# Patient Record
Sex: Male | Born: 1955
Health system: Southern US, Community
[De-identification: ages and names within clinical notes are randomized; demographics above are authoritative.]

## PROBLEM LIST (undated history)

## (undated) DIAGNOSIS — E559 Vitamin D deficiency, unspecified: Secondary | ICD-10-CM

## (undated) DIAGNOSIS — R0600 Dyspnea, unspecified: Secondary | ICD-10-CM

## (undated) DIAGNOSIS — E213 Hyperparathyroidism, unspecified: Secondary | ICD-10-CM

## (undated) DIAGNOSIS — N184 Chronic kidney disease, stage 4 (severe): Secondary | ICD-10-CM

## (undated) DIAGNOSIS — M25562 Pain in left knee: Secondary | ICD-10-CM

## (undated) DIAGNOSIS — I5021 Acute systolic (congestive) heart failure: Secondary | ICD-10-CM

## (undated) DIAGNOSIS — Z95810 Presence of automatic (implantable) cardiac defibrillator: Secondary | ICD-10-CM

## (undated) DIAGNOSIS — Z87442 Personal history of urinary calculi: Secondary | ICD-10-CM

## (undated) DIAGNOSIS — I255 Ischemic cardiomyopathy: Secondary | ICD-10-CM

## (undated) DIAGNOSIS — I509 Heart failure, unspecified: Secondary | ICD-10-CM

## (undated) DIAGNOSIS — M1990 Unspecified osteoarthritis, unspecified site: Secondary | ICD-10-CM

## (undated) DIAGNOSIS — E119 Type 2 diabetes mellitus without complications: Secondary | ICD-10-CM

## (undated) DIAGNOSIS — N200 Calculus of kidney: Secondary | ICD-10-CM

## (undated) DIAGNOSIS — I10 Essential (primary) hypertension: Secondary | ICD-10-CM

## (undated) HISTORY — DX: Type 2 diabetes mellitus without complications: E11.9

## (undated) HISTORY — PX: KIDNEY STONE SURGERY: SHX686

## (undated) HISTORY — DX: Calculus of kidney: N20.0

## (undated) HISTORY — PX: LITHOTRIPSY: SUR834

## (undated) HISTORY — DX: Acute systolic (congestive) heart failure: I50.21

## (undated) HISTORY — PX: COLONOSCOPY: SHX174

## (undated) HISTORY — DX: Heart failure, unspecified: I50.9

## (undated) HISTORY — DX: Chronic kidney disease, stage 4 (severe): N18.4

## (undated) HISTORY — PX: CHOLECYSTECTOMY: SHX55

---

## 1989-05-04 HISTORY — PX: AMPUTATION TOE: SHX6595

## 2000-05-04 DIAGNOSIS — I10 Essential (primary) hypertension: Secondary | ICD-10-CM

## 2000-05-04 HISTORY — DX: Essential (primary) hypertension: I10

## 2001-01-21 ENCOUNTER — Emergency Department (HOSPITAL_COMMUNITY): Admission: EM | Admit: 2001-01-21 | Discharge: 2001-01-21 | Payer: Self-pay | Admitting: *Deleted

## 2001-01-23 ENCOUNTER — Emergency Department (HOSPITAL_COMMUNITY): Admission: EM | Admit: 2001-01-23 | Discharge: 2001-01-23 | Payer: Self-pay | Admitting: *Deleted

## 2001-06-03 ENCOUNTER — Emergency Department (HOSPITAL_COMMUNITY): Admission: EM | Admit: 2001-06-03 | Discharge: 2001-06-03 | Payer: Self-pay | Admitting: Emergency Medicine

## 2004-05-27 ENCOUNTER — Emergency Department (HOSPITAL_COMMUNITY): Admission: EM | Admit: 2004-05-27 | Discharge: 2004-05-28 | Payer: Self-pay | Admitting: Emergency Medicine

## 2004-09-30 ENCOUNTER — Emergency Department (HOSPITAL_COMMUNITY): Admission: EM | Admit: 2004-09-30 | Discharge: 2004-09-30 | Payer: Self-pay | Admitting: Emergency Medicine

## 2005-10-26 ENCOUNTER — Emergency Department (HOSPITAL_COMMUNITY): Admission: EM | Admit: 2005-10-26 | Discharge: 2005-10-26 | Payer: Self-pay | Admitting: Emergency Medicine

## 2005-11-05 ENCOUNTER — Encounter (INDEPENDENT_AMBULATORY_CARE_PROVIDER_SITE_OTHER): Payer: Self-pay | Admitting: Specialist

## 2005-11-05 ENCOUNTER — Ambulatory Visit (HOSPITAL_COMMUNITY): Admission: RE | Admit: 2005-11-05 | Discharge: 2005-11-05 | Payer: Self-pay | Admitting: Urology

## 2005-11-06 ENCOUNTER — Ambulatory Visit (HOSPITAL_COMMUNITY): Admission: RE | Admit: 2005-11-06 | Discharge: 2005-11-06 | Payer: Self-pay | Admitting: Internal Medicine

## 2006-04-09 ENCOUNTER — Emergency Department (HOSPITAL_COMMUNITY): Admission: EM | Admit: 2006-04-09 | Discharge: 2006-04-09 | Payer: Self-pay | Admitting: Emergency Medicine

## 2007-04-22 ENCOUNTER — Emergency Department (HOSPITAL_COMMUNITY): Admission: EM | Admit: 2007-04-22 | Discharge: 2007-04-22 | Payer: Self-pay | Admitting: Emergency Medicine

## 2007-06-13 ENCOUNTER — Emergency Department (HOSPITAL_COMMUNITY): Admission: EM | Admit: 2007-06-13 | Discharge: 2007-06-13 | Payer: Self-pay | Admitting: Emergency Medicine

## 2007-10-06 ENCOUNTER — Emergency Department (HOSPITAL_COMMUNITY): Admission: EM | Admit: 2007-10-06 | Discharge: 2007-10-06 | Payer: Self-pay | Admitting: Emergency Medicine

## 2007-11-03 ENCOUNTER — Emergency Department (HOSPITAL_COMMUNITY): Admission: EM | Admit: 2007-11-03 | Discharge: 2007-11-03 | Payer: Self-pay | Admitting: Emergency Medicine

## 2007-11-15 ENCOUNTER — Inpatient Hospital Stay (HOSPITAL_COMMUNITY): Admission: EM | Admit: 2007-11-15 | Discharge: 2007-11-18 | Payer: Self-pay | Admitting: Emergency Medicine

## 2007-11-30 ENCOUNTER — Ambulatory Visit (HOSPITAL_COMMUNITY): Admission: RE | Admit: 2007-11-30 | Discharge: 2007-11-30 | Payer: Self-pay | Admitting: Urology

## 2007-12-01 ENCOUNTER — Emergency Department (HOSPITAL_COMMUNITY): Admission: EM | Admit: 2007-12-01 | Discharge: 2007-12-01 | Payer: Self-pay | Admitting: Emergency Medicine

## 2007-12-08 ENCOUNTER — Inpatient Hospital Stay (HOSPITAL_COMMUNITY): Admission: EM | Admit: 2007-12-08 | Discharge: 2007-12-09 | Payer: Self-pay | Admitting: Emergency Medicine

## 2007-12-14 ENCOUNTER — Emergency Department (HOSPITAL_COMMUNITY): Admission: EM | Admit: 2007-12-14 | Discharge: 2007-12-14 | Payer: Self-pay | Admitting: Emergency Medicine

## 2008-03-07 ENCOUNTER — Emergency Department (HOSPITAL_COMMUNITY): Admission: EM | Admit: 2008-03-07 | Discharge: 2008-03-07 | Payer: Self-pay | Admitting: Emergency Medicine

## 2008-03-14 ENCOUNTER — Inpatient Hospital Stay (HOSPITAL_COMMUNITY): Admission: EM | Admit: 2008-03-14 | Discharge: 2008-03-17 | Payer: Self-pay | Admitting: Emergency Medicine

## 2008-03-16 ENCOUNTER — Encounter (INDEPENDENT_AMBULATORY_CARE_PROVIDER_SITE_OTHER): Payer: Self-pay | Admitting: General Surgery

## 2008-09-25 IMAGING — CT CT PELVIS W/O CM
1 of 2 series · 15 of 32 positions shown, 19 images · non-contrast
Comparison: 12/08/2007

CT ABDOMEN

CLINICAL DATA: Left flank pain and epigastric pain.  Nausea.
Known renal stones.

CT ABDOMEN AND PELVIS WITHOUT CONTRAST
TECHNIQUE: Multidetector CT imaging of the abdomen and pelvis was
performed following the standard
protocol without intravenous contrast.

[Series 2: stone under (id) w/ prior · axial · 0.73mm/px · z∈[-513,-78]mm · 15 of 95 slices shown, 19 images]
[im 4/95  soft-tissue]
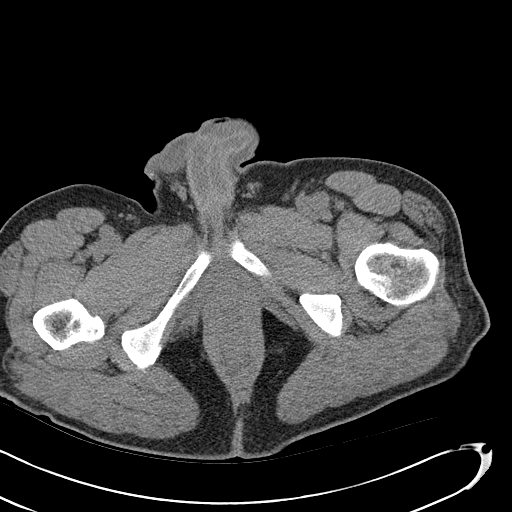
[im 4/95  bone]
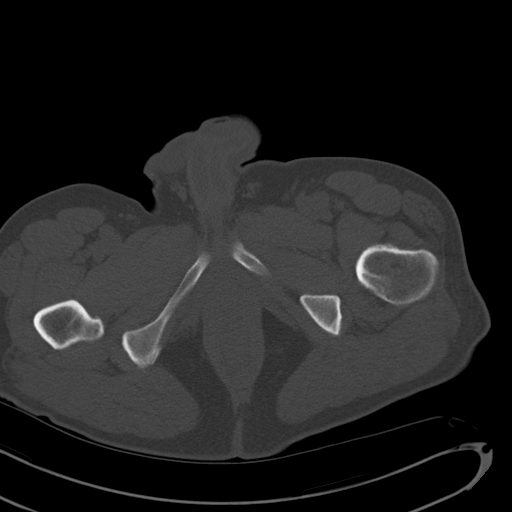
[im 12/95  soft-tissue]
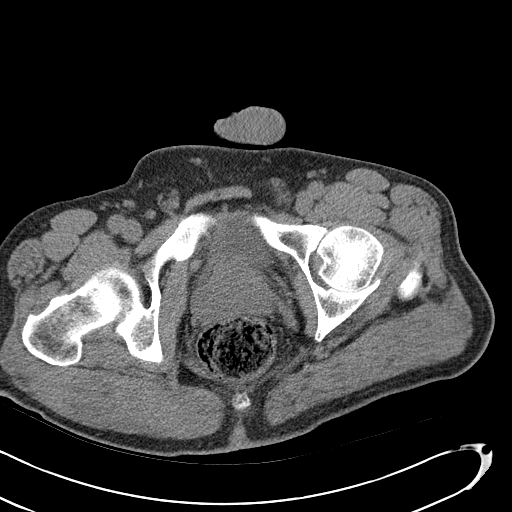
[im 20/95  soft-tissue]
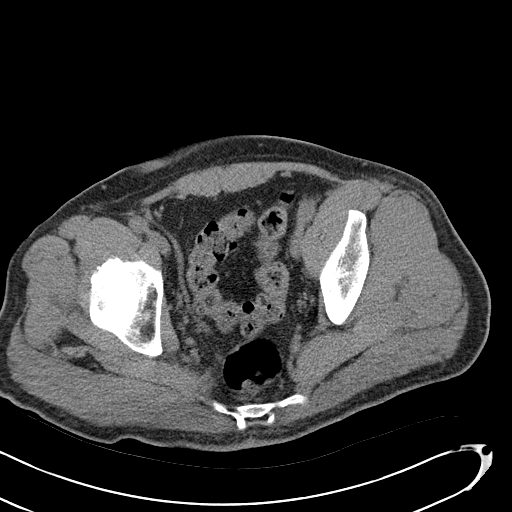
[im 28/95  soft-tissue]
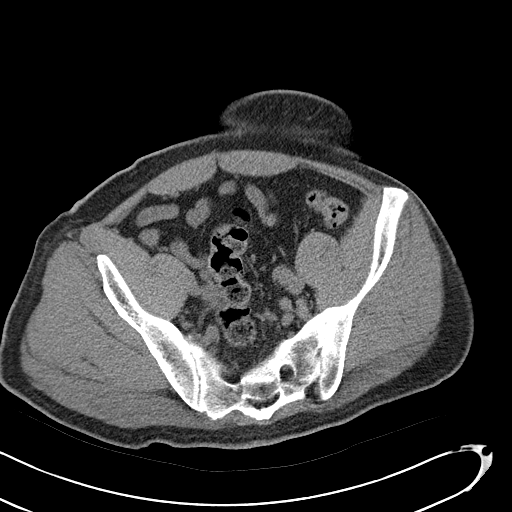
[im 32/95  soft-tissue]
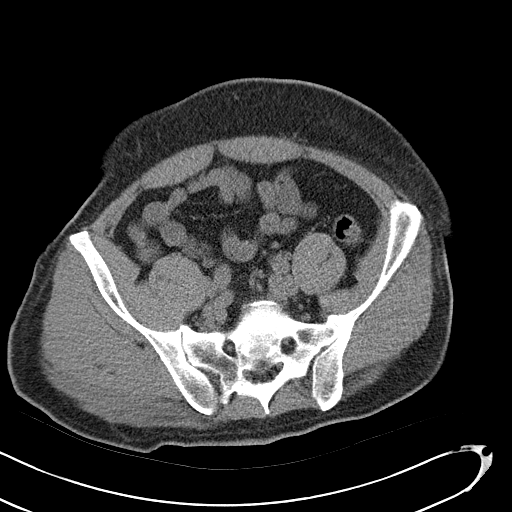
[im 40/95  soft-tissue]
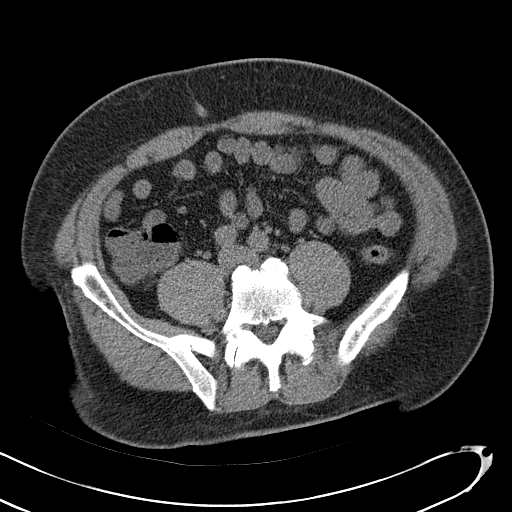
[im 48/95  soft-tissue]
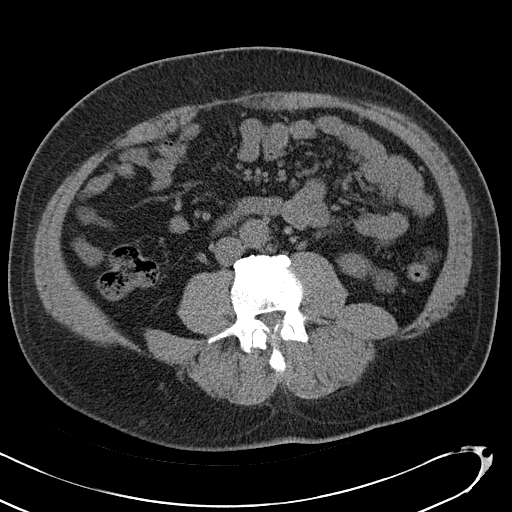
[im 55/95  soft-tissue]
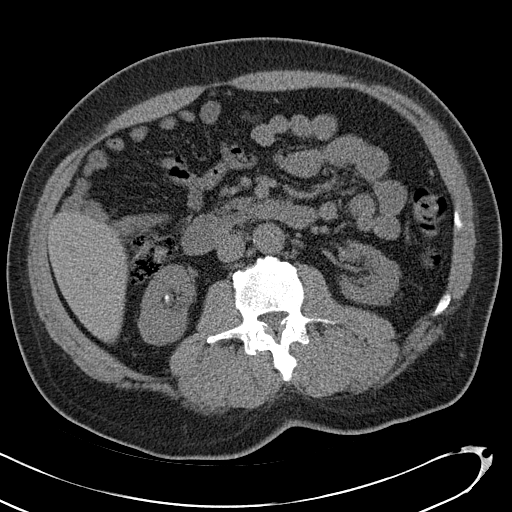
[im 63/95  soft-tissue]
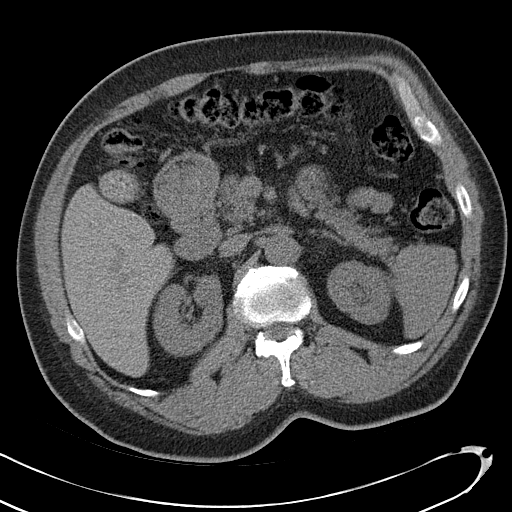
[im 63/95  bone]
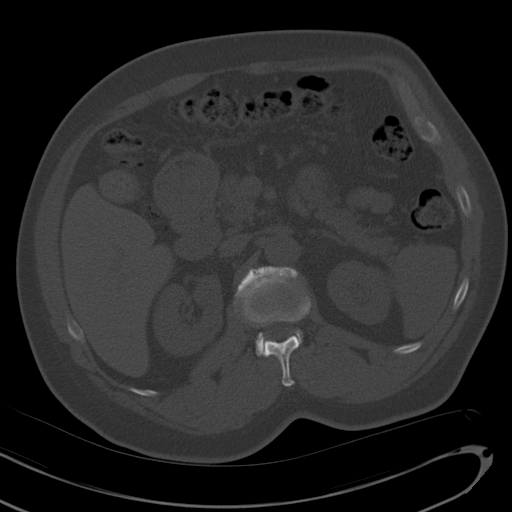
[im 67/95  soft-tissue]
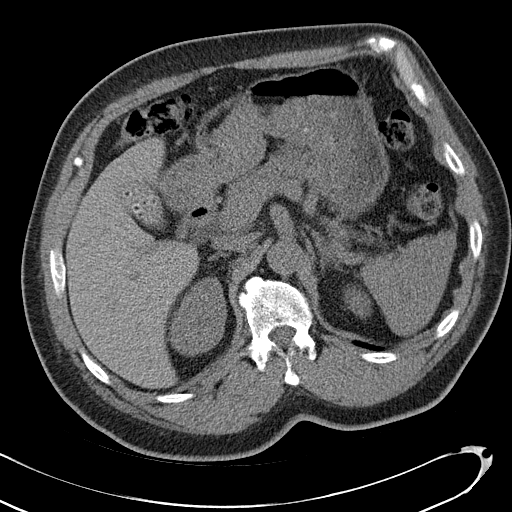
[im 75/95  soft-tissue]
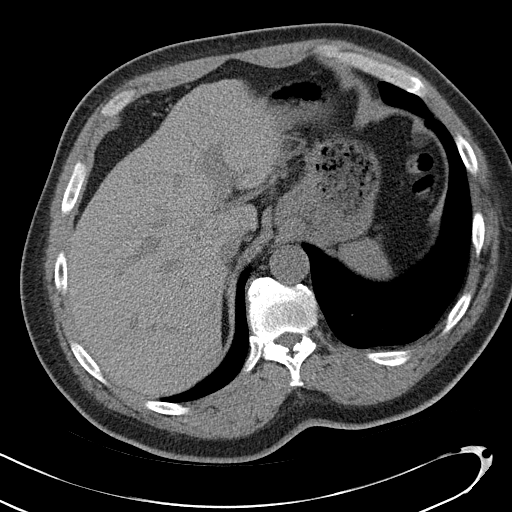
[im 79/95  lung]
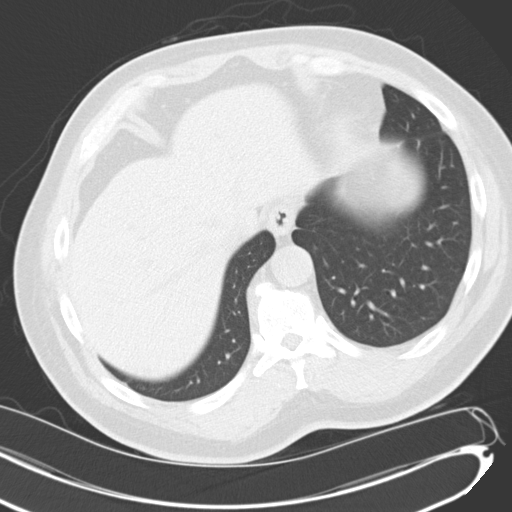
[im 83/95  soft-tissue]
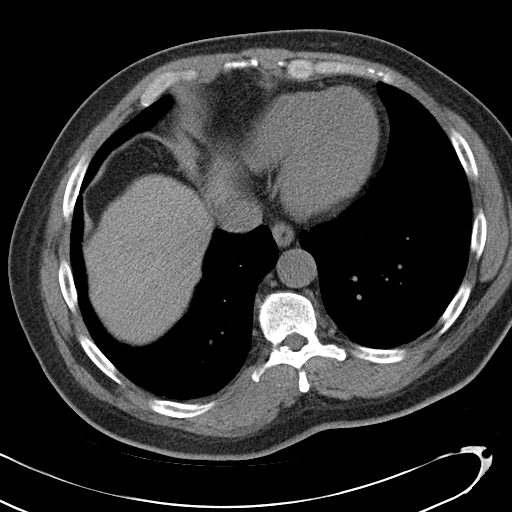
[im 83/95  lung]
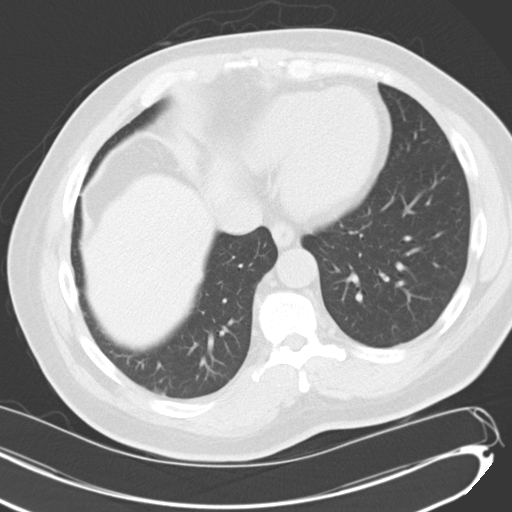
[im 87/95  lung]
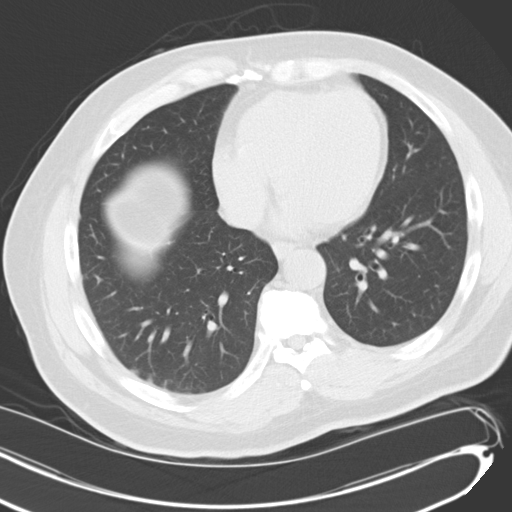
[im 91/95  soft-tissue]
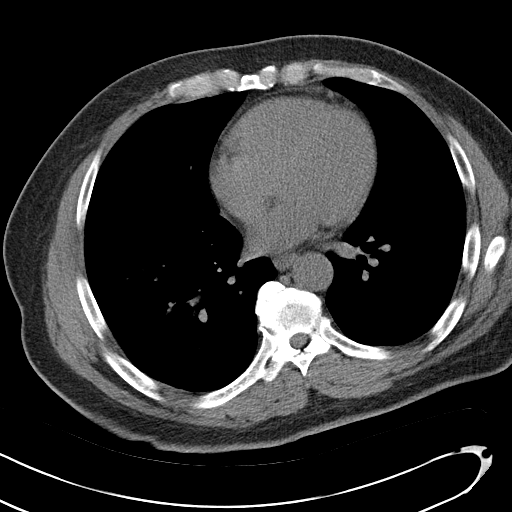
[im 91/95  lung]
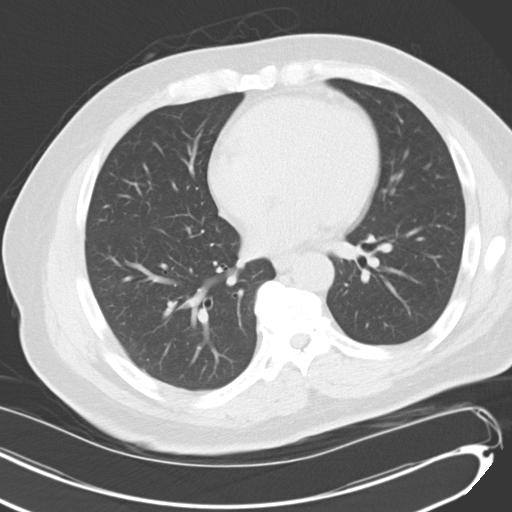

[15 of 32 positions shown; findings below may reference images not displayed]

FINDINGS: The gallbladder remains full of gallstones, each
measuring up to 8 mm in diameter.  The noncontrast CT appearance of
the liver and spleen is unremarkable.

Bilateral lower pole nonobstructive nephrolithiasis appears .
Exophytic hypodense left renal lesion is most compatible with a
cyst.  The CT appearance of the pancreas and adrenal glands is
unremarkable.  Small retroperitoneal lymph nodes appears stable.
No dilated upper abdominal bowel noted.

Lumbar spondylosis is noted with prominent facet arthropathy at the
L5-S1 levels especially on the left side.
IMPRESSION: 1.  Stable cholelithiasis and nonobstructive nephrolithiasis,
unchanged over the five abdominal CT scans obtained over the last 5
weeks.

CT PELVIS
FINDINGS: The small bladder calculi are now on the right side of
the urinary bladder rather than the left.  There is borderline
prominence of the prostate gland.  No free pelvic fluid noted.  No
distal hydroureter is identified.  There is left sacroiliitis and a
lesser degree of right sacroiliitis.  The appendix appears normal.
No dilated pelvic bowel noted.
IMPRESSION: 1.  Small bladder calculi are now on the right side of the urinary
bladder rather than the left.  Otherwise stable compared to prior
CT scans.

## 2009-01-28 ENCOUNTER — Emergency Department (HOSPITAL_COMMUNITY): Admission: EM | Admit: 2009-01-28 | Discharge: 2009-01-28 | Payer: Self-pay | Admitting: Emergency Medicine

## 2010-08-08 LAB — URINALYSIS, ROUTINE W REFLEX MICROSCOPIC
Ketones, ur: NEGATIVE mg/dL
Nitrite: NEGATIVE
Protein, ur: 100 mg/dL — AB
Urobilinogen, UA: 0.2 mg/dL (ref 0.0–1.0)

## 2010-09-16 NOTE — Op Note (Signed)
[name], [name]            ACCOUNT NO.:  [id number]   MEDICAL RECORD NO.:  [id number]          PATIENT TYPE:  INP   LOCATION:  A313                          FACILITY:  [facility]   PHYSICIAN:  Chelsea Primus, MD      DATE OF BIRTH:  04/05/1956   DATE OF PROCEDURE:  03/16/2008  DATE OF DISCHARGE:                               OPERATIVE REPORT   ADDENDUM  At the completion of closing the skin with the 4-0 Monocryl suture, the  skin was washed and dried with moist dry towel.  Benzoin was applied  around the incision.  Half inch Steri-Strips were placed.  The drapes  were removed.  The patient was allowed to come out of general  anesthetic.  He was transferred back to a regular hospital bed and  transferred to postanesthetic care unit in stable condition.  At the  conclusion of procedure, all instruments, sponge, and needle counts were  correct.  The patient tolerated the procedure well.      Chelsea Primus, MD  Electronically Signed     BZ/MEDQ  D:  03/16/2008  T:  03/17/2008  Job:  [id number]

## 2010-09-16 NOTE — Consult Note (Signed)
NAMESAGEN, AREOLA            ACCOUNT NO.:  [id number]   MEDICAL RECORD NO.:  [id number]          PATIENT TYPE:  INP   LOCATION:  A320                          FACILITY:  [facility]   PHYSICIAN:  Marissa Nestle, M.D.DATE OF BIRTH:  11-06-1955   DATE OF CONSULTATION:  DATE OF DISCHARGE:                                 CONSULTATION   CHIEF COMPLAINT:  Recurrent right renal colic.   HISTORY OF PRESENT ILLNESS:  This 55 year old gentleman came to the  emergency room with severe pain in the right flank associated with  nausea, no vomiting, fever, chills, or any voiding difficulties.  CT  scan showed a large, at least 5-mm stone in the right ureterovesical  junction causing moderate obstruction on that side.  He came to the  emergency room on November 15, 2007, so I admitted him in the hospital for  further management of pain and management of the stone.   PAST MEDICAL HISTORY:  He has a history of having kidney stones.  He was  treated with removal of the bladder stone in June 2007.  The stone was  impacted in the __________ posterior to the bladder and then was removed  via Holmium laser lithotripsy.  He was also found to have bilateral  renal calculi.  The patient never came back for followup in the office  for management of his renal calculi.  No history of any other  significant medical problems.   PERSONAL HISTORY:  He does not smoke or drink.   REVIEW OF SYSTEMS:  Unremarkable.   PHYSICAL EXAMINATION:  VITAL SIGNS:  Blood pressure 130/80 and  temperature is normal.  CENTRAL NERVOUS SYSTEM:  No gross neurological deficit.  HEAD, NECK, EYE, AND ENT:  Negative.  CHEST:  Symmetrical.  HEART:  Regular sinus rhythm.  No murmur.  ABDOMEN:  Soft and flat.  Liver, spleen, and kidneys are not palpable.  No CVA tenderness.  EXTERNAL GENITALIA:  Circumcised.  Meatus adequate.  Testicles are  normal.  RECTAL:  Deferred.  EXTREMITIES:  Normal.   IMPRESSION:  1. Bilateral renal  calculi.  2. Right ureteral calculus in the ureterovesical junction.      Marissa Nestle, M.D.  Electronically Signed     MIJ/MEDQ  D:  11/17/2007  T:  11/18/2007  Job:  [id number]

## 2010-09-16 NOTE — H&P (Signed)
NAMEOMARRION, DEBNAM            ACCOUNT NO.:  [id number]   MEDICAL RECORD NO.:  [id number]          PATIENT TYPE:  INP   LOCATION:  A313                          FACILITY:  [facility]   PHYSICIAN:  Anselmo Pickler, DO    DATE OF BIRTH:  1955/09/19   DATE OF ADMISSION:  03/14/2008  DATE OF DISCHARGE:  LH                              HISTORY & PHYSICAL   Patient is a 55 year old African American male who presented to the  emergency room with a chief complaint of abdominal pain.  He stated that  his pain started about a week ago, and it was acute and it has been  Constant.  It has been located in the right upper abdomen.  The pain has  had no radiation.  He says it comes and goes and waxes and wanes.  He  notices that it is aggravated by eating.   His current primary care physician is Dr. Jonna Munro.  He went to see him  and actually had an ultrasound done on November 4th, which showed  cholelithiasis without cholecystitis.  He also admits that symptoms have  been associated with nausea but has not had any vomiting or diarrhea.  He does have a significant history of gallstones that were just recently  diagnosed.   PAST MEDICAL HISTORY:  Significant for gallstones and kidney stones.  He  has had a lithotripsy several times and has had his second left toe  amputated.   He lives with relatives.  He is a nondrinker and nondrug user but smokes  about a pack a day.  He does not have any known drug allergies and is  not currently on any medications.   REVIEW OF SYSTEMS:  CONSTITUTIONAL:  Positive for appetite changes.  EYES:  Negative for eye pain or changes in vision.  HEENT:  Negative for  ear pain, throat pain, changes in smells, or sore throat.  CARDIOVASCULAR:  Negative for chest pain.  RESPIRATORY:  Negative for  shortness of breath.  GI:  Positive for abdominal pain and nausea.  Negative for diarrhea or constipation.  GU:  Negative for dysuria or  hesitancy.  MUSCULOSKELETAL:  Negative  for myalgias or arthralgias.  NEURO:  Negative for loss of consciousness.  SKIN: Negative for rash.  PSYCHIATRIC:  Negative for changes in affect.   PHYSICAL EXAMINATION:  Current listed vitals are as follows:  Blood  pressure 135/92, pulse rate 84, respirations 16, temperature 98.6.  Saturating at 96%.  GENERAL:  Patient is a well-developed and well-nourished [place]  American male who is currently uncomfortable-appearing.  Head is normocephalic and atraumatic.  Eyes are EOMI, PERRLA.  No  scleral icterus.  Ears:  TM's are visualized bilaterally.  Teeth are in  good repair.  Throat:  There is no erythema or exudate.  NECK:  Supple.  No lymphadenopathy.  HEART:  Regular rate and rhythm.  S1 and S2 present.  RESPIRATORY:  Clear to auscultation bilaterally.  No wheezes, rales or  rhonchi.  ABDOMEN:  Tender.  Soft.  Positive right upper quadrant tenderness.  Positive Murphy's sign.  No organomegaly noted.  EXTREMITIES:  Positive pulses.  No ecchymosis, edema, or cyanosis.  NEURO:  AO x3.  Cranial nerves II-XII grossly intact.  Good sensation  and good strength in all extremities.   CURRENT LABS:  Lipase 89, sodium 143, potassium 3.9, chloride 112,  carbon dioxide 28, glucose 101, BUN 11, creatinine 1.93.  White count is  7.5, hemoglobin 14.4, hematocrit 41.8, platelet count 214.   ASSESSMENT:  1. Cholelithiasis.  2. Elevated blood pressure.  3. Tobacco abuse.   PLAN:  Will go ahead and admit the patient to the service of [facility].  Will keep patient n.p.o.  Will give him IV hydration.  Will consult  surgery to see him.  Will continue pain management and DVT and GI  prophylaxis.  Will monitor his blood pressure.  Will continue to monitor  that as well.  Will put patient on a patch and see what recommendations  surgery has and continue to monitor from there.      Anselmo Pickler, DO  Electronically Signed     CB/MEDQ  D:  03/14/2008  T:  03/14/2008  Job:  [id number]   cc:    Weston Settle, M.D.

## 2010-09-16 NOTE — Group Therapy Note (Signed)
[name], [name]            ACCOUNT NO.:  [id number]   MEDICAL RECORD NO.:  [id number]          PATIENT TYPE:  INP   LOCATION:  A313                          FACILITY:  [facility]   PHYSICIAN:  Clifford Pickler, DO    DATE OF BIRTH:  Aug 21, 1955   DATE OF PROCEDURE:  03/15/2008  DATE OF DISCHARGE:                                 PROGRESS NOTE   The patient was seen today telling us that his pain was under a little  bit better control.  Still very hungry.  Explained to him why he could  not eat due to cholelithiasis and the possibility of an obstruction.  Dr. Geroge Baseman was scheduled to see him today as well and has decided to  take him to surgery tomorrow.   PHYSICAL EXAMINATION:  VITAL SIGNS:  Temperature 98.5, pulse 79,  respirations 20, blood pressure 142/83.  GENERAL:  The patient is a well-developed, well-nourished [place]  American male who was in no acute distress.  He is slightly  uncomfortable appearing.  HEART:  Regular rate and rhythm.  LUNGS:  Clear to auscultation bilaterally.  ABDOMEN:  Decreased postop positive tenderness in the right upper  quadrant.  EXTREMITIES:  Positive pulses.  No ecchymosis, edema or cyanosis.   White blood cells 7.2, hemoglobin 13.4, hematocrit 39.1, platelet count  221, INR is 1.1, sodium 142, potassium 3.7, chloride 111, CO2 26,  glucose 73, BUN 9, creatinine 1.57.  ALT AST within normal limits.   ASSESSMENT AND PLAN:  1. Cholelithiasis.  2. Acute renal failure.   Will go ahead and bump up his IV fluids and the patient will be  scheduled to go to surgery for tomorrow.  We will continue to monitor  and change therapy as necessary.      Clifford Pickler, DO  Electronically Signed     CB/MEDQ  D:  03/15/2008  T:  03/15/2008  Job:  [id number]

## 2010-09-16 NOTE — Group Therapy Note (Signed)
[name], [name]            ACCOUNT NO.:  [id number]   MEDICAL RECORD NO.:  [id number]          PATIENT TYPE:  INP   LOCATION:  A313                          FACILITY:  [facility]   PHYSICIAN:  Anselmo Pickler, DO    DATE OF BIRTH:  08/03/1955   DATE OF PROCEDURE:  DATE OF DISCHARGE:                                 PROGRESS NOTE   The patient is seen today resting comfortably in bed.  He is scheduled  for a cholecystectomy at 12:30.  He has no complaints currently.   PHYSICAL EXAMINATION:  VITAL SIGNS:  Temperature 98.3, pulse 73,  respirations 22, blood pressure 115/95.  GENERAL:  The patient is well-developed, well-nourished in no distress.  HEART:  Regular rate and rhythm.  LUNGS:  Clear to auscultation bilaterally.  ABDOMEN:  Some mild right upper quadrant tenderness.  EXTREMITIES:  Positive pulses.  No edema, ecchymosis or cyanosis.   LABORATORY DATA:  Today, white count of 6.3, hemoglobin 14.0, hematocrit  40.2, platelet count 235, sodium 140, potassium 3.6, chloride 111, CO2  of 26, glucose 120, BUN 11 and creatinine 1.63.   ASSESSMENT/PLAN:  1. Cholelithiasis.  The patient has elected to have surgery today and      will follow him after surgery.  2. Renal failure.  Will continue with IV fluids.  This also might be      his baseline which in that case we will recommend a referral to a      nephrologist at discharge.  3. Elevated blood pressure.  Will also recommend adding some blood      pressure medication to his regimen to control his blood pressure.   PLAN:  After surgery, we will let them determine when he can be  discharged to home and we will continue to follow the patient.      Anselmo Pickler, DO  Electronically Signed     CB/MEDQ  D:  03/16/2008  T:  03/16/2008  Job:  [id number]

## 2010-09-16 NOTE — Consult Note (Signed)
NAMEGREGORIO, [name]            ACCOUNT NO.:  [id number]   MEDICAL RECORD NO.:  [id number]          PATIENT TYPE:  INP   LOCATION:  A313                          FACILITY:  [facility]   PHYSICIAN:  Chelsea Primus, MD      DATE OF BIRTH:  [birth year]   DATE OF CONSULTATION:  03/15/2008  DATE OF DISCHARGE:  03/17/2008                                 CONSULTATION   REASON FOR CONSULTATION:  Abdominal pain.   HISTORY OF PRESENT ILLNESS:  The patient is a 55 year old male with a  history of renal calculi and known gallstones who presents with right  upper quadrant abdominal pain.  He states this has been mostly in his  epigastric area of the right upper quadrant.  It started acutely, pain  is exacerbated with eating.  He has had similar pain in the past, never  this severe.  He has not noted any significant change with fatty greasy  foods, however, has noted the significant bloating and increased  belching and flatus with certain foods.  He has had no change in bowel  movements.  No hematochezia, no melena.  He has had no fevers or chills.  He has denied any episode of jaundice.  There is no significant family  history of biliary disease.   PAST MEDICAL HISTORY:  Renal calculi.   PAST SURGICAL HISTORY:  Lithotripsy.   MEDICATIONS:  None.   ALLERGIES:  No known drug allergies.   SOCIAL HISTORY:  One pack per day smoker.  No alcohol.  No recreational  drug use.   PHYSICAL EXAMINATION:  VITAL SIGNS:  Temperature 98.5, heart rate 79,  respirations 20, blood pressure 142/83, and he is 98% O2 saturation on  room air.  GENERAL:  He is not in any acute distress.  He is alert and oriented x3.  HEENT:  Pupils equal, round, and reactive to light.  Extraocular  movements are intact.  No scleral icterus or conjunctival pallor is  noted.  Oral mucosa is pink.  Normal occlusion.  NECK:  Trachea is midline.  No cervical lymphadenopathy.  PULMONARY:  Clear to auscultation.  Unlabored respirations.  CARDIOVASCULAR:  Regular rate and rhythm.  He has 2+ radial pulses  bilaterally.  ABDOMEN:  Abdomen is soft, nontender, and nondistended.  He has no  masses.  EXTREMITIES:  Warm and dry.   PERTINENT RADIOGRAPHIC AND LABORATORY RESULTS:  CBC; white blood cell  count 7.2, hemoglobin 13.4, hematocrit 34.1, and platelets 201.  Basic  metabolic panel; sodium A999333, potassium 3.7, chloride 111, bicarb 26, BUN  9, creatinine 1.57, blood glucose 93, magnesium 1.8, and lipase 89.  UA  was negative.  He did have a right upper quadrant ultrasound initially  on March 07, 2008, that demonstrated symptomatology of positive  gallstones.  At that time, there are no dilatation of biliary tree.  No  gallbladder wall dilatation or gallbladder wall thickening.   ASSESSMENT AND PLAN:  Cholelithiasis.  At this point, his symptomatology  that he had upon admission was suggestive of cholecystitis.  Risks,  benefits, and alternatives of a laparoscopic possible open  cholecystectomy were  discussed at length with the patient including but  not limited to risk of bleeding, infection, bile leak, small bowel  injury, bile duct injury, as well as possibility of intraoperative  cardiac and pulmonary events.  At this point, I did discuss other  possible etiologies of right upper quadrant epigastric abdominal pain.  However, due to his symptomatology, I do suspect the majority of his  symptoms are related to biliary nature.  At this point, we will continue  to monitor the patient.  I will plan to make him n.p.o. after midnight,  resumption of IV fluid hydration, and plans to proceed to the operating  room tomorrow for a laparoscopic possible open cholecystectomy.      Chelsea Primus, MD  Electronically Signed     BZ/MEDQ  D:  03/17/2008  T:  03/17/2008  Job:  [id number]   cc:   [facility] Team

## 2010-09-16 NOTE — Op Note (Signed)
NAMEBARNELL, MANE            ACCOUNT NO.:  [id number]   MEDICAL RECORD NO.:  [id number]          PATIENT TYPE:  INP   LOCATION:  A313                          FACILITY:  [facility]   PHYSICIAN:  Chelsea Primus, MD      DATE OF BIRTH:  15-Jan-1956   DATE OF PROCEDURE:  03/16/2008  DATE OF DISCHARGE:                               OPERATIVE REPORT   PREOPERATIVE DIAGNOSIS:  Acute cholecystitis/cholelithiasis.   POSTOPERATIVE DIAGNOSIS:  Acute cholecystitis/cholelithiasis.   PROCEDURE:  Laparoscopic cholecystectomy.   SURGEON:  Chelsea Primus, MD   ANESTHESIA:  General endotracheal, local anesthetic 0.5% Sensorcaine  plain.   SPECIMEN:  Gallbladder and gallstones.   ESTIMATED BLOOD LOSS:  Minimal.   DISPOSITION:  Postanesthetic Care Unit in stable condition.   INDICATIONS:  The patient is a 55 year old male with a history of  hypertension, otherwise relatively healthy, who presented to [facility] with right upper quadrant and epigastric abdominal pain.  He  was evaluated.  He did have a workup that was consistent with acute  cholecystitis.  He was initially treated on pain medication and IV fluid  hydration.  His pain symptomatology resolved and risks, benefits, and  alternatives of a laparoscopic possible open cholecystectomy were  discussed at length with the patient including but not limited to risk  of bleeding, infection, bile leak, small bowel injury, common bile duct  injury as well as the possibility of intraoperative cardiac or pulmonary  events.  The patient's questions and concerns were addressed and the  patient was consented for the planned procedure.   OPERATION:  The patient was taken to the operating room and was placed  in supine position on the operating room table, at which time the  general anesthetic was administered.  Once the patient was asleep, he  was endotracheally intubated by anesthesia.  At this point, his abdomen  was prepped with DuraPrep  solution and draped in standard fashion.  A  stab incision was created supraumbilically with a [id number] scalpel.  Additional dissection down to the subcuticular tissue was carried out  using a Kocher clamp, which was utilized to grasp the anterior abdominal  wall fascia and lift it anteriorly.  A Veress needle was then inserted.  Saline drop test was utilized to confirm intraperitoneal placement and  then pneumoperitoneum was initiated.  Once sufficient pneumoperitoneum  was obtained, an 11-mm trocar was inserted over the laparoscope,  allowing visualization of the trocar entering into the peritoneum.  At  this time, the inner cannula was removed.  The laparoscope was  reinserted.  There is no evidence of any trocar or Veress needle  placement injury.  At this time, the remaining trocars were placed with  an 11-mm trocar in the epigastrium, a 5-mm trocar in the midline between  the two 11-mm trocars and a 5-mm trocar in the right lateral abdominal  wall.  The patient was then placed in reverse Trendelenburg left lateral  decubitus position.  The fundus of the gallbladder was grasped and  lifted up and over the right lobe of the liver.  There was inflammatory  adhesions  on the infundibulum and body as well as the peritoneum.  Peritoneal reflection of the infundibulum was bluntly stripped using a  IT consultant.  With continued dissection, it appeared that an  aberrant right hepatic artery was lying inferior to the infundibulum.  This was carefully dissected free using blunt dissection, did identify  what appeared to be in the cystic artery, which clearly entered into the  common bile or into the infundibulum of the gallbladder.  A window was  created behind this structure and two EndoClips were placed proximally,  one distally, and this was divided.  Continued dissection identified the  cystic duct.  Three EndoClips were placed proximally, one distally and  the cystic duct was divided  between the two most distal clips.  At this  time, electrocautery was utilized to carefully dissect the gallbladder  free from the gallbladder fossa.  Once the gallbladder was free, it was  placed in an EndoCatch bag and was placed up and over the right lobe of  liver.  Electrocautery was then utilized to obtain hemostasis in the  gallbladder fossa.  Once hemostasis was obtained, the field was  copiously irrigated with sterile saline using a suction aspirator.  At  this time, I did place a piece of Surgicel into the gallbladder fossa  and turned my attention to closure.   Using an Endoclose suture passing device, a 2-0 Vicryl suture was passed  through both the 11-mm trocar sites with these sutures in place and then  attempted to remove the gallbladder to the epigastric trocar site.  I  did require some both sharp and blunt dilatation of the trocar sites to  enlarge the opening adequately to pass the gallbladder through.  During  this, the EndoCatch bag was torn, I was inspected and was indeed  complete.  This was placed in the back table.  The gallbladder was  grasped with a Kelly clamp and was opened due to the difficulty with the  number of stones.  I did extract the stones through the cholecystotomy  that I created.  This allowed for adequate diminishing of the  gallbladder size to remove it through the epigastric trocar site.  The  stones and gallbladder were sent as a permanent specimen to pathology.  The epigastric trocar site was copiously irrigated with sterile saline,  and at this time, the Vicryl sutures were secured __________.    Dictation ended at this point      Chelsea Primus, MD  Electronically Signed     BZ/MEDQ  D:  03/16/2008  T:  03/17/2008  Job:  [id number]

## 2010-09-19 NOTE — Discharge Summary (Signed)
[name], [name]            ACCOUNT NO.:  [id number]   MEDICAL RECORD NO.:  [id number]          PATIENT TYPE:  [facility]   LOCATION:  A305                          FACILITY:  [facility]   PHYSICIAN:  Marissa Nestle, M.D.DATE OF BIRTH:  1956-03-12   DATE OF ADMISSION:  DATE OF DISCHARGE:  08/07/2009LH                               DISCHARGE SUMMARY   CHIEF COMPLAINT:  Backache.   HISTORY:  This 55 year old gentleman a couple of weeks ago had ESL done  for right renal calculus, came back to the emergency room complaining of  backache.  He could not say which side.  He has stones on both sides.  He came to the ER, so he was admitted for further management and control  of pain.  CT scan was done that shows bilateral renal calculi, but there  is no hydronephrosis or any obstruction.  He was admitted.  Routine  admission workup was done.  CBC, urinalysis, SMA-7 are normal.  He was  started on IV fluids and parenteral analgesia.  Over the next 24 hours,  his pain has subsided .  He will need to have further treatment for  these stones.  It looks like the ESL has not broken the stone.  He will  need probably additional treatment, and I have advised him to come back  for followup.   FINAL DISCHARGE DIAGNOSIS:  Bilateral renal calculi.   DISCHARGE CONDITION:  Improved.   DISCHARGE MEDICATION:  Percocet 1 q.6 h. p.r.n., #30.   Please note that I have combined the history and physical and my  discharge summary together in this patient, who stayed less than 24 hour  in the hospital.      Marissa Nestle, M.D.  Electronically Signed     MIJ/MEDQ  D:  12/30/2007  T:  12/31/2007  Job:  [id number]

## 2010-09-19 NOTE — Op Note (Signed)
[name], [name]            ACCOUNT NO.:  [id number]   MEDICAL RECORD NO.:  [id number]          PATIENT TYPE:  AMB   LOCATION:  DAY                           FACILITY:  [facility]   PHYSICIAN:  Miguel Dibble, M.D.   DATE OF BIRTH:  January 01, 1956   DATE OF PROCEDURE:  DATE OF DISCHARGE:                                 OPERATIVE REPORT   NO DICTATION:      Miguel Dibble, M.D.     SK/MEDQ  D:  11/05/2005  T:  11/05/2005  Job:  [id number]

## 2010-09-19 NOTE — H&P (Signed)
[name], [name]            ACCOUNT NO.:  [id number]   MEDICAL RECORD NO.:  [id number]          PATIENT TYPE:  AMB   LOCATION:  DAY                           FACILITY:  [facility]   PHYSICIAN:  Miguel Dibble, M.D.   DATE OF BIRTH:  [birth year]   DATE OF ADMISSION:  11/05/2005  DATE OF DISCHARGE:  LH                                HISTORY & PHYSICAL   CHIEF COMPLAINT:  Urinary retention, bladder calculus.   HISTORY OF PRESENT ILLNESS:  This 55 year old male has a history of  recurrent ureterolithiasis.  Passed several stones in the past.  He  developed inability to void, then came to the emergency room at [facility] on October 24, 2005.  He was found to be in urinary retention.  Plain  x-ray of the KUB area revealed bilateral renal calculi.  There was a large  12 mm size stone impacted in the prostate urethra.  The stone was pushed  into the bladder with the catheter, and the catheter was inserted.  The  patient is brought to the short stay center today for cystoscopy and holmium  laser lithotripsy overlying the bladder calculus.  The patient did not have  any fever, chills, or gross hematuria present.  He is on Foley catheter  drainage.   PAST MEDICAL HISTORY:  Recurrent ureterolithiasis.   MEDICATIONS:  None.   ALLERGIES:  None.   PHYSICAL EXAMINATION:  HEENT:  Normal.  NECK:  No masses.  LUNGS:  Clear to auscultation.  HEART:  Regular rate and rhythm.  No murmurs.  ABDOMEN:  Soft.  No palpable flank mass.  Costovertebral angle tenderness.no  suprapubic  tenderness.  GENITOURINARY:  Foley catheter is draining clear urine.  Penis and testes  are normal.   IMPRESSION:  Urinary retention, bladder calculus, bilateral renal calculi,  recurrent ureterolithiasis.   PLAN:  1.  Cystoscopy, holmium laser lithotripsy of the bladder calculus with      litholapaxy of the bladder calculus under anesthesia in the short stay      center.  I discussed with the patient regarding  the diagnosis, operative      details, alternative treatments, outcome, possible risks and      complications.  He has agreed for the procedure to be done.      Miguel Dibble, M.D.  Electronically Signed     SK/MEDQ  D:  11/04/2005  T:  11/04/2005  Job:  [id number]   cc:   Vernon Prey. Tamala Julian, M.D.  Fax: [phone]

## 2010-09-19 NOTE — Op Note (Signed)
NAMEEDWARDO, [name]            ACCOUNT NO.:  [id number]   MEDICAL RECORD NO.:  [id number]          PATIENT TYPE:  AMB   LOCATION:  DAY                           FACILITY:  [facility]   PHYSICIAN:  Miguel Dibble, M.D.   DATE OF BIRTH:  04/24/1956   DATE OF PROCEDURE:  11/05/2005  DATE OF DISCHARGE:                                 OPERATIVE REPORT   PREOPERATIVE DIAGNOSES:  1.  Urinary retention.  2.  Large bladder calculus.   POSTOPERATIVE DIAGNOSIS:  1.  Urinary retention.  2.  Large bladder calculus.   OPERATIVE PROCEDURE:  Cystoscopy, lithotripsy of bladder calculus,  litholapaxy.   ANESTHESIA:  General.   SURGEON:  Miguel Dibble, M.D.   COMPLICATIONS:  None.   DRAINS:  None.   SPECIMEN:  Bladder stone fragments sent for chemical analysis.   COMPLICATIONS:  None.   INDICATIONS FOR PROCEDURE:  This 55 year old male has a history of recurrent  urolithiasis.  He came to the emergency room with acute urinary retention.  X-rays revealed a 12 mm size stone impacted in the prostatic urethra with  obstruction.  The stone was pushed back into the bladder and a Foley  catheter was inserted.  The patient was brought to the short-stay center  today for cystoscopy, holmium laser lithotripsy and litholapaxy of the  bladder calculus.   DESCRIPTION OF PROCEDURE:  General anesthesia was induced and the patient  was placed on the OR table in the dorsal lithotomy position.  The lower  abdomen and genitalia were prepped and draped in a sterile fashion.  Cystoscopy was done with the 25-French scope.  The urethra was normal.  There was moderate hypertrophy of the prostate with partial obstruction of  bladder neck area.  The bladder was then examined.  There was a 12 x 10 mm  size stone in the bladder.  Bladder mucosa was otherwise normal.   The visual lithotrite was then inserted into the bladder.  Under direct  vision, the stone was crushed into several pieces.  The lithotrite was  then  removed.  Irrigation of the bladder was done with the [facility] evacuator and  all the stone fragments were removed.  The instruments were removed.  The  patient was transferred to the PACU in a satisfactory condition.      Miguel Dibble, M.D.  Electronically Signed     SK/MEDQ  D:  11/05/2005  T:  11/05/2005  Job:  [id number]   cc:   Vernon Prey. Tamala Julian, M.D.  Fax: [phone]

## 2010-09-19 NOTE — Discharge Summary (Signed)
NAMEBONNIE, RICCELLI            ACCOUNT NO.:  [id number]   MEDICAL RECORD NO.:  [id number]         PATIENT TYPE:  PINP   LOCATION:  A305                          FACILITY:  [facility]   PHYSICIAN:  Marissa Nestle, M.D.DATE OF BIRTH:  24-Dec-1955   DATE OF ADMISSION:  DATE OF DISCHARGE:  LH                               DISCHARGE SUMMARY   CHIEF COMPLAINT:  Recurrent right renal colic.   This 55 years old gentleman came to the emergency room with severe pain  in the right flank associated nausea, vomiting, fever and chills.  No  voiding difficulties.  CT scan showed large 5-mm stone in the right  ureterovesical junction causing mild to moderate obstruction.  He was  admitted for further management and control of pain.  He also has a  large stone in his both kidneys.  He was admitted, CBC, urinalysis, SMA-  7 is normal and he continued to have pain, so I scheduled to have a  stone basket on November 17, 2007 and on November 18, 2007, he passed the stone,  so he was discharged home and as per the renal calculi, he will have an  lithotripsy done as outpatient, which I have discussed with him.   FINAL DISCHARGE DIAGNOSES:  Right ureteral calculus passed and bilateral  renal calculi.   DISCHARGE CONDITION:  Improved.   DISCHARGE MEDICATIONS:  Percocet 7.5 mg 1 q.6 h. p.r.n., #30.  I will  see him back in the office on Monday.      Marissa Nestle, M.D.  Electronically Signed     MIJ/MEDQ  D:  12/30/2007  T:  12/31/2007  Job:  [id number]

## 2010-09-19 NOTE — Discharge Summary (Signed)
NAMEESMERALDA, [name]            ACCOUNT NO.:  [id number]   MEDICAL RECORD NO.:  [id number]          PATIENT TYPE:  INP   LOCATION:  A313                          FACILITY:  [facility]   PHYSICIAN:  Chelsea Primus, MD      DATE OF BIRTH:  [birth year]   DATE OF ADMISSION:  03/14/2008  DATE OF DISCHARGE:  11/14/2009LH                               DISCHARGE SUMMARY   ADMISSION DIAGNOSES:  1. Right upper quadrant abdominal pain.  2. Acute cholecystitis and cholelithiasis.   DISCHARGE DIAGNOSES:  1. Status post laparoscopic cholecystectomy.  2. History of renal calculi.   ADMITTING SERVICE:  [facility] Team.   DISCHARGING SERVICE:  Chelsea Primus, MD   DISPOSITION:  Home.   BRIEF HISTORY AND PHYSICAL:  Please see admission history and physical  for the complete H&P.  The patient is a 55 year old male who presented  to the [facility] Emergency Department with pain in the epigastrium and  right upper quadrant abdominal areas.  Due to his symptomatology, it was  suspected that it was a biliary etiology.  He was admitted on March 14, 2008.   HOSPITAL COURSE:  The patient was admitted for continued management.  His pain was controlled.  He was managed initially in a conservative  manner.  He had resolution of the symptomatology and prior to discharge  we discussed with the patient the possibility of laparoscopic possible  open cholecystectomy.  The patient's questions and concerns were  addressed.  The patient was consented for the planned procedure.  He  underwent the laparoscopic cholecystectomy on March 16, 2008.  He  tolerated the procedure well.  He was advanced to regular diet  following.  On March 17, 2008, the patient's pain was controlled on  oral pain medications.  He was ambulatory and was tolerating regular  diet.  The patient was discharged to home on March 17, 2008.   DISCHARGE INSTRUCTIONS:  The patient was instructed to resume a normal  diet.  He was advised to  continue to keep the surgical sites clean with  soap and water.  He may increase his activity slowly.  He may walk up  steps.  He may shower in the morning.  He is not to lift anything  greater than 20 pounds for the next 3 or 4 weeks.  He is not to drive  while taking pain medications.  He is to return to see myself in 3  weeks.   DISCHARGE MEDICATIONS:  1. Lortab 5/500 one to two p.o. q.4 h. p.r.n. pain.  2. Ibuprofen 400 mg p.o. q.6 h. p.r.n. pain.   I have instructed him not to take any Tylenol or acetaminophen products  while on the Lortab.  Otherwise, the patient is to continue all  previously prescribed home medications.      Chelsea Primus, MD  Electronically Signed     BZ/MEDQ  D:  04/04/2008  T:  04/04/2008  Job:  [id number]

## 2010-09-19 NOTE — Consult Note (Signed)
[name], [name]            ACCOUNT NO.:  [id number]   MEDICAL RECORD NO.:  [id number]          PATIENT TYPE:  EMS   LOCATION:  ED                            FACILITY:  [facility]   PHYSICIAN:  [name], M.D.   DATE OF BIRTH:  1955-08-16   DATE OF CONSULTATION:  10/26/2005  DATE OF DISCHARGE:                                   CONSULTATION   CHIEF COMPLAINT:  Inability to void, suprapubic pain.   HISTORY OF PRESENT ILLNESS:  This 55 year old male has a history of  recurrent urolithiasis.  He has passed several stones in the past.  He  passed a 5 mm size stone last night.  He developed inability to void since  this morning.  He came to the emergency room for further evaluation.  The  emergency room physician was unable to insert a Foley catheter.  I was asked  to see the patient for further evaluation and management.   The patient denied having fever, chills, voiding difficulty or gross  hematuria in the past.  He did not have any flank pain at the time of  examination.   PAST MEDICAL HISTORY:  Unremarkable.   MEDICATIONS:  None.   ALLERGIES:  None.   PHYSICAL EXAMINATION:  GENERAL: The patient is in severe pain.  ABDOMEN:  Soft.  No palpable flank mass or CVA tenderness. Suprapubic  fullness and tenderness are noted.  GU:  Testes were normal.   I tried to pass a 1 6-French Foley catheter.  Obstruction was noted at the  level of the bladder neck area.   An x-ray of the KUB area including the pelvis was done.  This revealed  bilateral large renal calculi. There was a 12 mm size stone impacted in the  posterior urethra.   The genitalia was then prepped and draped in a sterile fashion; 5 mL of 2%  Xylocaine was instilled into the ureter, and the penis was clamped for about  5 minutes.  I passed a 34-French ureteral sound into the bladder.  I was  able to push the stone back into the bladder.  After this, I was able to  insert a 16-French Foley catheter into the bladder,  and clear urine was  drained.   IMPRESSION:  1.  Urinary retention.  2.  Impacted ureteral calculus.  3.  Bilateral renal calculi.   PLAN:  1.  The patient will be seen in the office on October 27, 2005.  He will be      scheduled to undergo cystoscopy and      holmium laser lithotripsy of bladder calculus.  2.  Cipro 500 mg p.o. b.i.d. for 7 days.  3.  Percocet 5/325 one p.o. nightly p.r.n. pain, #20.      [name], M.D.  Electronically Signed     SK/MEDQ  D:  10/26/2005  T:  10/26/2005  Job:  [id number]   cc:   Vernon Prey. Tamala Julian, M.D.  Fax: [phone]

## 2011-01-23 LAB — URINALYSIS, ROUTINE W REFLEX MICROSCOPIC
Nitrite: NEGATIVE
Specific Gravity, Urine: 1.01
Urobilinogen, UA: 0.2
pH: 6

## 2011-01-23 LAB — URINE MICROSCOPIC-ADD ON

## 2011-01-23 LAB — URINE CULTURE

## 2011-01-29 LAB — URINALYSIS, ROUTINE W REFLEX MICROSCOPIC
Bilirubin Urine: NEGATIVE
Bilirubin Urine: NEGATIVE
Ketones, ur: NEGATIVE
Leukocytes, UA: NEGATIVE
Nitrite: NEGATIVE
Nitrite: NEGATIVE
Protein, ur: NEGATIVE
Specific Gravity, Urine: 1.025
Urobilinogen, UA: 0.2
pH: 6
pH: 6

## 2011-01-29 LAB — CBC
HCT: 39.3
HCT: 42.4
MCHC: 34.1
MCHC: 34.3
MCV: 90.4
Platelets: 228
Platelets: 236
RDW: 13.7
WBC: 7.9

## 2011-01-29 LAB — DIFFERENTIAL
Basophils Absolute: 0
Basophils Relative: 0
Basophils Relative: 0
Eosinophils Absolute: 0
Eosinophils Absolute: 0.2
Eosinophils Relative: 1
Eosinophils Relative: 3
Lymphocytes Relative: 20
Lymphs Abs: 3.4
Monocytes Absolute: 0.4
Neutrophils Relative %: 45

## 2011-01-29 LAB — BASIC METABOLIC PANEL
BUN: 12
BUN: 14
CO2: 26
CO2: 28
Chloride: 108
Creatinine, Ser: 1.8 — ABNORMAL HIGH
GFR calc non Af Amer: 39 — ABNORMAL LOW
Glucose, Bld: 108 — ABNORMAL HIGH
Glucose, Bld: 113 — ABNORMAL HIGH
Potassium: 3.5
Potassium: 3.6

## 2011-01-29 LAB — URINE MICROSCOPIC-ADD ON

## 2011-01-30 LAB — URINALYSIS, ROUTINE W REFLEX MICROSCOPIC
Bilirubin Urine: NEGATIVE
Bilirubin Urine: NEGATIVE
Glucose, UA: NEGATIVE
Glucose, UA: NEGATIVE
Ketones, ur: NEGATIVE
Ketones, ur: NEGATIVE
Ketones, ur: NEGATIVE
Nitrite: NEGATIVE
Protein, ur: 30 — AB
Protein, ur: 30 — AB
Specific Gravity, Urine: >1.03 — ABNORMAL HIGH
Urobilinogen, UA: 1
Urobilinogen, UA: 0.2

## 2011-01-30 LAB — CBC
HCT: 42.5
Hemoglobin: 14.5
MCHC: 34.2
MCHC: 34.2
MCV: 89.6
RBC: 4.74
RBC: 4.34
RDW: 14
WBC: 8.2

## 2011-01-30 LAB — COMPREHENSIVE METABOLIC PANEL
ALT: 10
AST: 13
Albumin: 3.5
Calcium: 11 — ABNORMAL HIGH
GFR calc Af Amer: 43 — ABNORMAL LOW
Sodium: 142
Total Protein: 6.6

## 2011-01-30 LAB — DIFFERENTIAL
Basophils Relative: 0
Eosinophils Absolute: 0
Eosinophils Absolute: 0.2
Eosinophils Relative: 0
Eosinophils Relative: 4
Lymphs Abs: 0.9
Lymphs Abs: 2.1
Monocytes Absolute: 0.1
Monocytes Relative: 1 — ABNORMAL LOW
Monocytes Relative: 7

## 2011-01-30 LAB — BASIC METABOLIC PANEL
CO2: 28
Calcium: 10.4
Chloride: 111
GFR calc Af Amer: 39 — ABNORMAL LOW
GFR calc non Af Amer: 40 — ABNORMAL LOW
Glucose, Bld: 99
Potassium: 4.6
Sodium: 141

## 2011-01-30 LAB — URINE MICROSCOPIC-ADD ON

## 2011-02-03 LAB — DIFFERENTIAL
Basophils Absolute: 0.1
Basophils Relative: 0
Basophils Relative: 1
Basophils Relative: 1
Eosinophils Absolute: 0.2
Lymphocytes Relative: 19
Lymphocytes Relative: 39
Lymphs Abs: 2.4
Monocytes Absolute: 0 — ABNORMAL LOW
Monocytes Absolute: 0.1
Monocytes Absolute: 0.5
Monocytes Absolute: 0.8
Monocytes Relative: 1 — ABNORMAL LOW
Monocytes Relative: 2 — ABNORMAL LOW
Monocytes Relative: 6
Neutro Abs: 3.4
Neutro Abs: 3.5
Neutro Abs: 6.3
Neutrophils Relative %: 70

## 2011-02-03 LAB — CBC
HCT: 44.3
Hemoglobin: 14
MCHC: 34.9
MCV: 90.7
Platelets: 211
Platelets: 214
Platelets: 221
RBC: 4.39
RDW: 13.7
RDW: 13.7
RDW: 13.7
WBC: 6.3
WBC: 9.1

## 2011-02-03 LAB — COMPREHENSIVE METABOLIC PANEL
ALT: 10
ALT: 8
AST: 12
Albumin: 3.2 — ABNORMAL LOW
Albumin: 3.5
Albumin: 4
Alkaline Phosphatase: 68
Alkaline Phosphatase: 72
BUN: 11
BUN: 12
Chloride: 105
Creatinine, Ser: 1.81 — ABNORMAL HIGH
GFR calc Af Amer: 45 — ABNORMAL LOW
Potassium: 3.7
Potassium: 3.9
Sodium: 142
Sodium: 143
Total Bilirubin: 1.4 — ABNORMAL HIGH
Total Protein: 6.1
Total Protein: 6.5

## 2011-02-03 LAB — URINE CULTURE
Culture: NO GROWTH
Special Requests: NEGATIVE

## 2011-02-03 LAB — URINALYSIS, ROUTINE W REFLEX MICROSCOPIC
Glucose, UA: NEGATIVE
Hgb urine dipstick: NEGATIVE
Ketones, ur: NEGATIVE
Nitrite: NEGATIVE
Protein, ur: NEGATIVE
Protein, ur: NEGATIVE
Specific Gravity, Urine: 1.02
Urobilinogen, UA: 0.2

## 2011-02-03 LAB — BASIC METABOLIC PANEL
CO2: 26
Calcium: 9.9
Chloride: 111
GFR calc Af Amer: 54 — ABNORMAL LOW
Sodium: 140

## 2011-02-03 LAB — LIPASE, BLOOD: Lipase: 95 — ABNORMAL HIGH

## 2011-02-03 LAB — PROTIME-INR: Prothrombin Time: 12.8

## 2011-02-06 LAB — URINALYSIS, ROUTINE W REFLEX MICROSCOPIC
Glucose, UA: NEGATIVE
Specific Gravity, Urine: 1.01
pH: 5.5

## 2011-02-06 LAB — URINE MICROSCOPIC-ADD ON

## 2011-04-08 ENCOUNTER — Encounter: Payer: Self-pay | Admitting: Emergency Medicine

## 2011-04-08 ENCOUNTER — Emergency Department (HOSPITAL_COMMUNITY)
Admission: EM | Admit: 2011-04-08 | Discharge: 2011-04-08 | Disposition: A | Discharge status: Home / Self Care | Payer: Self-pay | Attending: Emergency Medicine | Admitting: Emergency Medicine

## 2011-04-08 ENCOUNTER — Emergency Department (HOSPITAL_COMMUNITY): Payer: Self-pay

## 2011-04-08 DIAGNOSIS — R109 Unspecified abdominal pain: Secondary | ICD-10-CM | POA: Insufficient documentation

## 2011-04-08 DIAGNOSIS — N200 Calculus of kidney: Secondary | ICD-10-CM | POA: Insufficient documentation

## 2011-04-08 DIAGNOSIS — N289 Disorder of kidney and ureter, unspecified: Secondary | ICD-10-CM | POA: Insufficient documentation

## 2011-04-08 DIAGNOSIS — F17200 Nicotine dependence, unspecified, uncomplicated: Secondary | ICD-10-CM | POA: Insufficient documentation

## 2011-04-08 LAB — URINE MICROSCOPIC-ADD ON

## 2011-04-08 LAB — URINALYSIS, ROUTINE W REFLEX MICROSCOPIC
Bilirubin Urine: NEGATIVE
Ketones, ur: NEGATIVE mg/dL
Nitrite: NEGATIVE
Protein, ur: 30 mg/dL — AB

## 2011-04-08 MED ORDER — HYDROMORPHONE HCL PF 1 MG/ML IJ SOLN
1.0000 mg | Freq: Once | INTRAMUSCULAR | Status: AC
  Administered 2011-04-08: 1 mg via INTRAVENOUS
  Filled 2011-04-08: qty 1

## 2011-04-08 MED ORDER — KETOROLAC TROMETHAMINE 30 MG/ML IJ SOLN
30.0000 mg | Freq: Once | INTRAMUSCULAR | Status: AC
  Administered 2011-04-08: 30 mg via INTRAVENOUS
  Filled 2011-04-08: qty 1

## 2011-04-08 MED ORDER — PROMETHAZINE HCL 25 MG PO TABS: 25.0000 mg | ORAL_TABLET | Freq: Four times a day (QID) | ORAL | Status: AC | PRN

## 2011-04-08 MED ORDER — ONDANSETRON HCL 4 MG/2ML IJ SOLN
4.0000 mg | Freq: Once | INTRAMUSCULAR | Status: AC
  Administered 2011-04-08: 4 mg via INTRAVENOUS
  Filled 2011-04-08: qty 2

## 2011-04-08 MED ORDER — SODIUM CHLORIDE 0.9 % IV SOLN
Freq: Once | INTRAVENOUS | Status: AC
  Administered 2011-04-08: 13:00:00 via INTRAVENOUS

## 2011-04-08 MED ORDER — OXYCODONE-ACETAMINOPHEN 5-325 MG PO TABS: 1.0000 | ORAL_TABLET | Freq: Four times a day (QID) | ORAL | Status: AC | PRN

## 2011-04-08 NOTE — ED Notes (Signed)
Discharge instructions reviewed with pt-verbalizes understanding; e-signature not working for pt to sign.

## 2011-04-08 NOTE — ED Provider Notes (Signed)
History   This chart was scribed for [employer]. Olin Hauser, MD by Kathreen Cornfield. The patient was seen in room APA03/APA03 and the patient's care was started at 1:00PM.    CSN: [id number] Arrival date & time: 04/08/2011 11:25 AM   First MD Initiated Contact with Patient 04/08/11 1221      Chief Complaint  Patient presents with  . Flank Pain  . Back Pain    (Consider location/radiation/quality/duration/timing/severity/associated sxs/prior treatment) HPI  [name] is a 55 y.o. male who presents to the Emergency Department complaining of moderate to severe, constant left flank pain radiating to LLQ onset three days ago with associated symptoms including diarrhea and chills. Pt. Denies hematuria, nausea, vomiting, fever. Pt. Has a history of kidney stones. Reports last CT-Abdomen performed in 2010.   Urologist is Dr. Michela Pitcher  Past Medical History  Diagnosis Date  . Renal disorder     Past Surgical History  Procedure Date  . Cholecystectomy     History reviewed. No pertinent family history.  History  Substance Use Topics  . Smoking status: Current Everyday Smoker    Types: Cigarettes  . Smokeless tobacco: Not on file  . Alcohol Use: No      Review of Systems  10 Systems reviewed and are negative for acute change except as noted in the HPI.   Allergies  Review of patient's allergies indicates no known allergies.  Home Medications  No current outpatient prescriptions on file.  BP 171/106  Pulse 95  Temp(Src) 98.3 F (36.8 C) (Oral)  Resp 16  Ht 6' (1.829 m)  Wt 215 lb (97.523 kg)  BMI 29.16 kg/m2  SpO2 100%  Physical Exam  Nursing note and vitals reviewed. Constitutional: He is oriented to person, place, and time. He appears well-developed and well-nourished. No distress.       Appears uncomfortable.   HENT:  Head: Normocephalic and atraumatic.  Eyes: EOM are normal. Pupils are equal, round, and reactive to light.  Neck: Normal range of motion. Neck  supple. No tracheal deviation present.  Cardiovascular: Normal rate, regular rhythm and normal heart sounds.   Pulmonary/Chest: Effort normal. No respiratory distress.  Abdominal: Soft. Bowel sounds are normal. He exhibits no distension. There is no tenderness.       Left CVA tenderness.  Musculoskeletal: Normal range of motion. He exhibits no edema.  Neurological: He is alert and oriented to person, place, and time. No sensory deficit.  Skin: Skin is warm and dry.  Psychiatric: He has a normal mood and affect. His behavior is normal.    ED Course  Procedures (including critical care time)  DIAGNOSTIC STUDIES: Oxygen Saturation is 100% on room air, normal by my interpretation.    COORDINATION OF CARE:  1:05PM- EDP at bedside discusses treatment plan.   Results for orders placed during the hospital encounter of 04/08/11  URINALYSIS, ROUTINE W REFLEX MICROSCOPIC      Component Value Range   Color, Urine YELLOW  YELLOW    APPearance CLEAR  CLEAR    Specific Gravity, Urine >1.030 (*) 1.005 - 1.030    pH 6.0  5.0 - 8.0    Glucose, UA NEGATIVE  NEGATIVE (mg/dL)   Hgb urine dipstick MODERATE (*) NEGATIVE    Bilirubin Urine NEGATIVE  NEGATIVE    Ketones, ur NEGATIVE  NEGATIVE (mg/dL)   Protein, ur 30 (*) NEGATIVE (mg/dL)   Urobilinogen, UA 0.2  0.0 - 1.0 (mg/dL)   Nitrite NEGATIVE  NEGATIVE    Leukocytes,  UA SMALL (*) NEGATIVE   URINE MICROSCOPIC-ADD ON      Component Value Range   Squamous Epithelial / LPF RARE  RARE    WBC, UA 11-20  <3 (WBC/hpf)   RBC / HPF 3-6  <3 (RBC/hpf)   Bacteria, UA FEW (*) RARE    Casts GRANULAR CAST (*) NEGATIVE    Crystals CA OXALATE CRYSTALS (*) NEGATIVE     Ct Abdomen Pelvis Wo Contrast  04/08/2011  *RADIOLOGY REPORT*  Clinical Data: Left flank and back pain, diarrhea, chills, history kidney stones, lithotripsy, cholecystectomy  CT ABDOMEN AND PELVIS WITHOUT CONTRAST  Technique:  Multidetector CT imaging of the abdomen and pelvis was performed  following the standard protocol without intravenous contrast. Sagittal and coronal MPR images reconstructed from axial data set.  Comparison: 01/28/2009  Findings: Minimal atelectasis or infiltrate medial left lung base. Gallbladder surgically absent. Large nonobstructing calculus upper pole left kidney 11 x 6 x 10 mm. Large calculi at the lower poles of both kidneys, 13 x 11 x 18 mm left and 8 x 5 x 8 mm right image 42. Exophytic cyst lower pole left kidney 2.6 x 2.7 cm image 41. No hydronephrosis, ureteral calcification, or ureteral dilatation. Ureters and bladder unremarkable. Prostatic enlargement, gland measuring 6.2 x 5.0 cm image 86.  Within limits of a nonenhanced exam, no focal abnormalities of the liver, spleen, pancreas, or adrenal glands. Normal appendix. Stomach and bowel loops unremarkable. No mass, adenopathy, free fluid, or inflammatory process. Scattered end plate spur formation thoracolumbar spine.  IMPRESSION: Bilateral nonobstructing renal calculi. Stable lower pole left renal cyst. Prostatic enlargement. No acute intra-abdominal or intrapelvic abnormalities identified.  Original Report Authenticated By: Burnetta Sabin, M.D.      MDM  Patient with left flank pain , passing small fragments of stones here with increased pain. IVF administered with analgesics and antiemetics. CT confirms stones in both kidney, no obstructions. Pt feels improved after observation and/or treatment in ED.Pt stable in ED with no significant deterioration in condition.The patient appears reasonably screened and/or stabilized for discharge and I doubt any other medical condition or other [facility] requiring further screening, evaluation, or treatment in the ED at this time prior to discharge.  I personally performed the services described in this documentation, which was scribed in my presence. The recorded information has been reviewed and considered.  MDM Reviewed: previous chart, nursing note and vitals Reviewed  previous: CT scan Interpretation: CT scan and labs          Gypsy Balsam. Olin Hauser, MD 04/10/11 1054

## 2011-04-08 NOTE — ED Notes (Signed)
Pt c/o left flank and lower back pain x 3 days. Pt states he has a hx of kidney stones.

## 2011-04-08 NOTE — ED Notes (Signed)
Patient resting with eyes closed. Pt responds to verbal stimuli. Pt rates pain at a 9/10.

## 2011-04-08 NOTE — ED Notes (Signed)
[name] c/o pain. Dr Olin Hauser made aware. Awaiting orders. [name] made aware that Dr Olin Hauser is going to look at the CT results and then prescribe something for pain.

## 2016-05-19 ENCOUNTER — Emergency Department (HOSPITAL_COMMUNITY)
Admission: EM | Admit: 2016-05-19 | Discharge: 2016-05-19 | Disposition: A | Discharge status: Home / Self Care | Payer: Self-pay | Attending: Emergency Medicine | Admitting: Emergency Medicine

## 2016-05-19 ENCOUNTER — Encounter (HOSPITAL_COMMUNITY): Payer: Self-pay | Admitting: Emergency Medicine

## 2016-05-19 DIAGNOSIS — F17210 Nicotine dependence, cigarettes, uncomplicated: Secondary | ICD-10-CM | POA: Insufficient documentation

## 2016-05-19 DIAGNOSIS — Z7982 Long term (current) use of aspirin: Secondary | ICD-10-CM | POA: Insufficient documentation

## 2016-05-19 DIAGNOSIS — I10 Essential (primary) hypertension: Secondary | ICD-10-CM | POA: Insufficient documentation

## 2016-05-19 DIAGNOSIS — I160 Hypertensive urgency: Secondary | ICD-10-CM | POA: Insufficient documentation

## 2016-05-19 DIAGNOSIS — R42 Dizziness and giddiness: Secondary | ICD-10-CM

## 2016-05-19 HISTORY — DX: Essential (primary) hypertension: I10

## 2016-05-19 LAB — BASIC METABOLIC PANEL
Anion gap: 5 (ref 5–15)
BUN: 27 mg/dL — AB (ref 6–20)
CO2: 25 mmol/L (ref 22–32)
Calcium: 10.2 mg/dL (ref 8.9–10.3)
Chloride: 108 mmol/L (ref 101–111)
Creatinine, Ser: 2.36 mg/dL — ABNORMAL HIGH (ref 0.61–1.24)
GFR calc Af Amer: 33 mL/min — ABNORMAL LOW (ref >60)
GFR, EST NON AFRICAN AMERICAN: 29 mL/min — AB (ref >60)
GLUCOSE: 104 mg/dL — AB (ref 65–99)
POTASSIUM: 4.1 mmol/L (ref 3.5–5.1)
Sodium: 138 mmol/L (ref 135–145)

## 2016-05-19 LAB — CBC WITH DIFFERENTIAL/PLATELET
Basophils Absolute: 0 10*3/uL (ref 0.0–0.1)
Basophils Relative: 0 %
EOS PCT: 2 %
Eosinophils Absolute: 0.1 10*3/uL (ref 0.0–0.7)
HEMATOCRIT: 40 % (ref 39.0–52.0)
Hemoglobin: 13.8 g/dL (ref 13.0–17.0)
LYMPHS ABS: 2 10*3/uL (ref 0.7–4.0)
LYMPHS PCT: 31 %
MCH: 30.4 pg (ref 26.0–34.0)
MCHC: 34.5 g/dL (ref 30.0–36.0)
MCV: 88.1 fL (ref 78.0–100.0)
MONO ABS: 0.4 10*3/uL (ref 0.1–1.0)
Monocytes Relative: 6 %
Neutro Abs: 3.9 10*3/uL (ref 1.7–7.7)
Neutrophils Relative %: 61 %
PLATELETS: 204 10*3/uL (ref 150–400)
RBC: 4.54 MIL/uL (ref 4.22–5.81)
RDW: 14.5 % (ref 11.5–15.5)
WBC: 6.4 10*3/uL (ref 4.0–10.5)

## 2016-05-19 LAB — CBG MONITORING, ED: Glucose-Capillary: 92 mg/dL (ref 65–99)

## 2016-05-19 MED ORDER — HYDROCHLOROTHIAZIDE 25 MG PO TABS
25.0000 mg | ORAL_TABLET | Freq: Once | ORAL | Status: AC
  Administered 2016-05-19: 25 mg via ORAL
  Filled 2016-05-19: qty 1

## 2016-05-19 MED ORDER — HYDROCHLOROTHIAZIDE 25 MG PO TABS: 25.0000 mg | ORAL_TABLET | Freq: Every day | ORAL | 0 refills | Status: DC

## 2016-05-19 NOTE — Discharge Instructions (Signed)
As discussed, your evaluation today has been largely reassuring.  But, it is important that you monitor your condition carefully, and do not hesitate to return to the ED if you develop new, or concerning changes in your condition. ? ?Otherwise, please follow-up with your physician for appropriate ongoing care. ? ?

## 2016-05-19 NOTE — ED Notes (Signed)
Patient given discharge instruction, verbalized understand. Patient ambulatory out of the department.  

## 2016-05-19 NOTE — ED Notes (Signed)
Pt  States he was treated for HTN years ago. Feels like maybe like he was anxious have job interview and physical.

## 2016-05-19 NOTE — ED Notes (Signed)
MD at the bedside to discuss a plan

## 2016-05-19 NOTE — ED Triage Notes (Signed)
Pt reports dizziness for one week and htn starting today.  Pt took bp today at job interview 158/104, is not currently taking any medication for htn.  Pt alert and oriented, denies unilateral weakness.

## 2016-05-19 NOTE — ED Provider Notes (Signed)
[facility] DEPT Provider Note   CSN: [id number] Arrival date & time: 05/19/16  1141   By signing my name below, I, Collene Leyden, attest that this documentation has been prepared under the direction and in the presence of Carmin Muskrat, MD. Electronically Signed: Collene Leyden, Scribe. 05/19/16. 1:39 PM.  History   Chief Complaint Chief Complaint  Patient presents with  . Dizziness   HPI Comments: [name] is a 61 y.o. male who presents to the Emergency Department complaining of nausea that began one week ago. Patient states he went into a job interview at [employer] and his blood pressure was elevated at 158/104. Patient states he took himself off of blood pressure medications 13 years ago. Patient has no associated symptoms.  No modifying factors indicated. He denies any light-headedness, nausea, confusion, fever, chills, extremity weakness, or abdominal pain.   The history is provided by the patient. No language interpreter was used.    Past Medical History:  Diagnosis Date  . Hypertension   . Renal disorder     There are no active problems to display for this patient.   Past Surgical History:  Procedure Laterality Date  . CHOLECYSTECTOMY         Home Medications    Prior to Admission medications   Not on File    Family History History reviewed. No pertinent family history.  Social History Social History  Substance Use Topics  . Smoking status: Current Every Day Smoker    Types: Cigarettes  . Smokeless tobacco: Not on file  . Alcohol use No     Allergies   Patient has no known allergies.   Review of Systems Review of Systems  Constitutional:       Per HPI, otherwise negative  HENT:       Per HPI, otherwise negative  Respiratory:       Per HPI, otherwise negative  Cardiovascular:       Per HPI, otherwise negative  Gastrointestinal: Negative for vomiting.  Endocrine:       Negative aside from HPI  Genitourinary:       Neg aside  from HPI   Musculoskeletal:       Per HPI, otherwise negative  Skin: Negative.   Neurological: Positive for dizziness. Negative for syncope.     Physical Exam Updated Vital Signs BP (!) 168/108 (BP Location: Left Arm)   Pulse 90   Temp 97.8 F (36.6 C) (Oral)   Resp 18   Ht 6' (1.829 m)   Wt 196 lb (88.9 kg)   SpO2 100%   BMI 26.58 kg/m   Physical Exam  Constitutional: He is oriented to person, place, and time. He appears well-developed. No distress.  HENT:  Head: Normocephalic and atraumatic.  Eyes: Conjunctivae and EOM are normal.  Cardiovascular: Normal rate and regular rhythm.   Pulmonary/Chest: Effort normal. No stridor. No respiratory distress.  Abdominal: He exhibits no distension.  Musculoskeletal: He exhibits no edema.  Neurological: He is alert and oriented to person, place, and time. No cranial nerve deficit or sensory deficit. He exhibits normal muscle tone.  Skin: Skin is warm and dry.  Psychiatric: He has a normal mood and affect.  Nursing note and vitals reviewed.    ED Treatments / Results  DIAGNOSTIC STUDIES: Oxygen Saturation is 100% on RA, normal by my interpretation.    COORDINATION OF CARE: 1:39 PM Discussed treatment plan with pt at bedside and pt agreed to plan.  Labs (all labs ordered are  listed, but only abnormal results are displayed) Labs Reviewed  CBC WITH DIFFERENTIAL/PLATELET  BASIC METABOLIC PANEL  CBG MONITORING, ED  CBG MONITORING, ED    EKG  EKG Interpretation  Date/Time:  Tuesday May 19 2016 12:04:28 EST Ventricular Rate:  85 PR Interval:  150 QRS Duration: 98 QT Interval:  378 QTC Calculation: 449 R Axis:   12 Text Interpretation:  Normal sinus rhythm Possible Left atrial enlargement Borderline ECG Confirmed by Carmin Muskrat  MD [phone]) on 05/19/2016 12:15:01 PM        Procedures Procedures (including critical care time)  Medications Ordered in ED Medications  hydrochlorothiazide (HYDRODIURIL) tablet 25  mg (not administered)     Initial Impression / Assessment and Plan / ED Course  I have reviewed the triage vital signs and the nursing notes.  Pertinent labs & imaging results that were available during my care of the patient were reviewed by me and considered in my medical decision making (see chart for details).  Clinical Course     Patient presents with concern of mild dizziness, hypertension. He is awake, alert, with no focal neurologic deficits, no distress. No evidence for end organ damage. Patient is hypertensive, has been not compliant with previously prescribed medication. Patient started on hydrochlorothiazide therapy, will follow up with a primary care physician.  Patient has had prior elevated creatinine, and today it likely represents similar abnormality, and I encouraged the patient to have repeat lab testing after he has been compliant with medication.  Final Clinical Impressions(s) / ED Diagnoses   I personally performed the services described in this documentation, which was scribed in my presence. The recorded information has been reviewed and is accurate.       Carmin Muskrat, MD 05/19/16 [phone]

## 2016-05-19 NOTE — ED Notes (Signed)
Pt states he has some potato chips and 4 oz  Of water today. Pt is concerned about waiting. State he needs the job and was suppose to start immediately. Pt wanting to see MD before RN starts IV

## 2016-05-19 NOTE — ED Notes (Signed)
EKG given to Dr. Lockwood. 

## 2016-05-27 NOTE — Congregational Nurse Program (Signed)
Congregational Nurse Program Note  Date of Encounter: 05/27/2016  Past Medical History: Past Medical History:  Diagnosis Date  . Hypertension   . Renal disorder     Encounter Details:     CNP Questionnaire - 05/27/16 1444      Patient Demographics   Is this a new or existing patient? New   Patient is considered a/an Not Applicable   Race African-American/Black     Patient Assistance   Location of Patient [place]   Patient's financial/insurance status Low Income;Self-Pay (Uninsured)   Uninsured Patient ([place]) Yes   Interventions Counseled to make appt. with provider;Assisted patient in making appt.   Patient referred to apply for the following financial assistance [place] insecurities addressed Referred to food bank or resource   Transportation assistance No   Assistance securing medications No   Educational health offerings Chronic disease;Hypertension;Medications;Navigating the healthcare system;Exercise/physical activity     Encounter Details   Primary purpose of visit Chronic Illness/Condition Visit;Education/Health Concerns;Navigating the Healthcare System;Post ED/Hospitalization Visit   Was an Emergency Department visit averted? Not Applicable   Does patient have a medical provider? No   Patient referred to Area Agency;Clinic;Establish PCP   Was a mental health screening completed? (GAINS tool) No   Does patient have dental issues? Yes   Was a dental referral made? No resources for a referral   Does patient have vision issues? No   Does your patient have an abnormal blood pressure today? Yes   Since previous encounter, have you referred patient for abnormal blood pressure that resulted in a new diagnosis or medication change? No   Does your patient have an abnormal blood glucose today? No   Since previous encounter, have you referred patient for abnormal blood glucose that resulted in a new diagnosis or  medication change? No   Was there a life-saving intervention made? No    New [facility] to [facility]. Client was recently diagnosed with hypertension and was seen at [facility] ED and was started on medication. Client now seeking help navigating into a primary care provider.  Client reports that if he can get his blood pressure controled he has a job waiting at [employer]. Currently client lives with his mother and he has no income and no health insurance.  Alert and oriented to person and place.  Denies chest pain, pulse regular, no swelling of extremities Denies shortness of breath, lungs clear to ascultation. Denies headaches or blurred vision, wears reading glasses. He denies any weakness of extremities. No facial drooping noted, speech clear, gait steady.  Abdomen soft, non tender  Past surgical history: Second toe left foot amputation.  Medications: HCTZ 25 mg by mouth daily ( was taken today).Aspirin 81mg  by mouth daily ( started taking on his own). Excedrin as needed  Client states he wants to get his blood pressure under control. Since being in the ER last week, he has stopped smoking and reports that he reads food labels now for salt content and doesn't add salt to his food.  He reports he also walks about 3 miles a day. RN discussed importance of taking medications as prescribed , to continue with smoking cessation as well as managing salt and fat intake. RN suggested client discuss exercise with medical provider, client states understanding. RN discussed risks of stroke and heart attack and symptoms to seek immediate medical treatment with slurred speech, facial drooping , sudden vision changes as well  as weakness of extremities . Discussed options for medical care without insurance or income at this time. [facility] to [facility] to his home and transportation. Referral made into the free clinic and appointment secured for  05/28/16 at 0830 am RN will follow up with client after first medical appointment with provider.   Referral also made for [facility] for food assistance and client states he is applying for food stamps. Will follow as needed.

## 2016-05-28 ENCOUNTER — Ambulatory Visit: Payer: Self-pay | Admitting: Physician Assistant

## 2016-05-28 ENCOUNTER — Encounter: Payer: Self-pay | Admitting: Physician Assistant

## 2016-05-28 ENCOUNTER — Other Ambulatory Visit: Payer: Self-pay | Admitting: Physician Assistant

## 2016-05-28 VITALS — BP 156/100 | HR 68 | Temp 97.5°F | Ht 70.0 in | Wt 197.8 lb

## 2016-05-28 DIAGNOSIS — Z131 Encounter for screening for diabetes mellitus: Secondary | ICD-10-CM

## 2016-05-28 DIAGNOSIS — Z125 Encounter for screening for malignant neoplasm of prostate: Secondary | ICD-10-CM

## 2016-05-28 DIAGNOSIS — Z87891 Personal history of nicotine dependence: Secondary | ICD-10-CM

## 2016-05-28 DIAGNOSIS — N189 Chronic kidney disease, unspecified: Secondary | ICD-10-CM

## 2016-05-28 DIAGNOSIS — Z1211 Encounter for screening for malignant neoplasm of colon: Secondary | ICD-10-CM

## 2016-05-28 DIAGNOSIS — I10 Essential (primary) hypertension: Secondary | ICD-10-CM

## 2016-05-28 DIAGNOSIS — Z13220 Encounter for screening for lipoid disorders: Secondary | ICD-10-CM

## 2016-05-28 LAB — COMPREHENSIVE METABOLIC PANEL
ALT: 8 U/L — ABNORMAL LOW (ref 9–46)
AST: 12 U/L (ref 10–35)
Albumin: 4.1 g/dL (ref 3.6–5.1)
Alkaline Phosphatase: 86 U/L (ref 40–115)
BUN: 26 mg/dL — AB (ref 7–25)
CO2: 22 mmol/L (ref 20–31)
CREATININE: 2.35 mg/dL — AB (ref 0.70–1.33)
Calcium: 11 mg/dL — ABNORMAL HIGH (ref 8.6–10.3)
Chloride: 106 mmol/L (ref 98–110)
GLUCOSE: 97 mg/dL (ref 65–99)
Potassium: 4.5 mmol/L (ref 3.5–5.3)
SODIUM: 140 mmol/L (ref 135–146)
Total Bilirubin: 0.7 mg/dL (ref 0.2–1.2)
Total Protein: 7.5 g/dL (ref 6.1–8.1)

## 2016-05-28 LAB — LIPID PANEL
Cholesterol: 204 mg/dL — ABNORMAL HIGH (ref <200)
HDL: 48 mg/dL (ref >40)
LDL CALC: 121 mg/dL — AB (ref <100)
Total CHOL/HDL Ratio: 4.3 Ratio (ref <5.0)
Triglycerides: 173 mg/dL — ABNORMAL HIGH (ref <150)
VLDL: 35 mg/dL — ABNORMAL HIGH (ref <30)

## 2016-05-28 MED ORDER — LISINOPRIL-HYDROCHLOROTHIAZIDE 20-25 MG PO TABS: 1.0000 | ORAL_TABLET | Freq: Every day | ORAL | 0 refills | Status: DC

## 2016-05-28 NOTE — Progress Notes (Signed)
BP (!) 156/100 (BP Location: Left Arm, Patient Position: Sitting, Cuff Size: Normal)   Pulse 68   Temp 97.5 F (36.4 C)   Ht 5\' 10"  (1.778 m)   Wt 197 lb 12 oz (89.7 kg)   SpO2 99%   BMI 28.37 kg/m    Subjective:    Patient ID: [name], male    DOB: May 12, 1955, 61 y.o.   MRN: [id number]  HPI: [name] is a 61 y.o. male presenting on 05/28/2016 for New Patient (Initial Visit) (pt has not had PCP in 10 years.) and Hypertension (pt is not able to work due to his blood pressure being high.)   HPI  Pt got new job but job Physical found that he has htn.  Pt states hx htn long time ago but he stopped his medication himself because he thought he was better.  Pt started on hctz 25 by ER 2 week ago.  Pt can't start working until his bp gets better  Pt stopped smoking Monday and has been trying to improved diet and exercise recently.   Pt states he has never had colonscopy.   Pt says he is feeling well  Relevant past medical, surgical, family and social history reviewed and updated as indicated. Interim medical history since our last visit reviewed. Allergies and medications reviewed and updated.   Current Outpatient Prescriptions:  .  aspirin EC 81 MG tablet, Take 81 mg by mouth daily., Disp: , Rfl:  .  Aspirin-Acetaminophen-Caffeine (EXCEDRIN PO), Take 1 tablet by mouth daily as needed., Disp: , Rfl:  .  hydrochlorothiazide (HYDRODIURIL) 25 MG tablet, Take 1 tablet (25 mg total) by mouth daily., Disp: 30 tablet, Rfl: 0  Review of Systems  Constitutional: Negative for appetite change, chills, diaphoresis, fatigue, fever and unexpected weight change.  HENT: Positive for dental problem. Negative for congestion, drooling, ear pain, facial swelling, hearing loss, mouth sores, sneezing, sore throat, trouble swallowing and voice change.   Eyes: Negative for pain, discharge, redness, itching and visual disturbance.  Respiratory: Negative for cough, choking, shortness of  breath and wheezing.   Cardiovascular: Negative for chest pain, palpitations and leg swelling.  Gastrointestinal: Negative for abdominal pain, blood in stool, constipation, diarrhea and vomiting.  Endocrine: Negative for cold intolerance, heat intolerance and polydipsia.  Genitourinary: Negative for decreased urine volume, dysuria and hematuria.  Musculoskeletal: Negative for arthralgias, back pain and gait problem.  Skin: Negative for rash.  Allergic/Immunologic: Negative for environmental allergies.  Neurological: Negative for seizures, syncope, light-headedness and headaches.  Hematological: Negative for adenopathy.  Psychiatric/Behavioral: Negative for agitation, dysphoric mood and suicidal ideas. The patient is not nervous/anxious.     Per HPI unless specifically indicated above     Objective:    BP (!) 156/100 (BP Location: Left Arm, Patient Position: Sitting, Cuff Size: Normal)   Pulse 68   Temp 97.5 F (36.4 C)   Ht 5\' 10"  (1.778 m)   Wt 197 lb 12 oz (89.7 kg)   SpO2 99%   BMI 28.37 kg/m   Wt Readings from Last 3 Encounters:  05/28/16 197 lb 12 oz (89.7 kg)  05/27/16 201 lb (91.2 kg)  05/19/16 196 lb (88.9 kg)    Physical Exam  Constitutional: He is oriented to person, place, and time. He appears well-developed and well-nourished.  HENT:  Head: Normocephalic and atraumatic.  Mouth/Throat: Oropharynx is clear and moist. No oropharyngeal exudate.  Eyes: Conjunctivae and EOM are normal. Pupils are equal, round, and reactive to light.  Neck: Neck supple. No thyromegaly present.  Cardiovascular: Normal rate and regular rhythm.   Pulmonary/Chest: Effort normal and breath sounds normal. He has no wheezes. He has no rales.  Abdominal: Soft. Bowel sounds are normal. He exhibits no mass. There is no hepatosplenomegaly. There is no tenderness.  Musculoskeletal: He exhibits no edema.  Lymphadenopathy:    He has no cervical adenopathy.  Neurological: He is alert and oriented  to person, place, and time.  Skin: Skin is warm and dry. No rash noted.  Psychiatric: He has a normal mood and affect. His behavior is normal. Thought content normal.  Vitals reviewed.   Results for orders placed or performed during the hospital encounter of 05/19/16  CBC with Differential  Result Value Ref Range   WBC 6.4 4.0 - 10.5 K/uL   RBC 4.54 4.22 - 5.81 MIL/uL   Hemoglobin 13.8 13.0 - 17.0 g/dL   HCT 40.0 39.0 - 52.0 %   MCV 88.1 78.0 - 100.0 fL   MCH 30.4 26.0 - 34.0 pg   MCHC 34.5 30.0 - 36.0 g/dL   RDW 14.5 11.5 - 15.5 %   Platelets 204 150 - 400 K/uL   Neutrophils Relative % 61 %   Neutro Abs 3.9 1.7 - 7.7 K/uL   Lymphocytes Relative 31 %   Lymphs Abs 2.0 0.7 - 4.0 K/uL   Monocytes Relative 6 %   Monocytes Absolute 0.4 0.1 - 1.0 K/uL   Eosinophils Relative 2 %   Eosinophils Absolute 0.1 0.0 - 0.7 K/uL   Basophils Relative 0 %   Basophils Absolute 0.0 0.0 - 0.1 K/uL  Basic metabolic panel  Result Value Ref Range   Sodium 138 135 - 145 mmol/L   Potassium 4.1 3.5 - 5.1 mmol/L   Chloride 108 101 - 111 mmol/L   CO2 25 22 - 32 mmol/L   Glucose, Bld 104 (H) 65 - 99 mg/dL   BUN 27 (H) 6 - 20 mg/dL   Creatinine, Ser 2.36 (H) 0.61 - 1.24 mg/dL   Calcium 10.2 8.9 - 10.3 mg/dL   GFR calc non Af Amer 29 (L) >60 mL/min   GFR calc Af Amer 33 (L) >60 mL/min   Anion gap 5 5 - 15  CBG monitoring, ED  Result Value Ref Range   Glucose-Capillary 92 65 - 99 mg/dL      Assessment & Plan:   Encounter Diagnoses  Name Primary?  . Essential hypertension Yes  . Chronic kidney disease, unspecified CKD stage   . Screening for diabetes mellitus (DM)   . Screening cholesterol level   . Screening for prostate cancer   . Special screening for malignant neoplasms, colon   . History of cigarette smoking     -check baseline labs (fasting) -increase medication to lisinopril/hctz -gave iFOBT for colon cancer screening -follow up one week.  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

## 2016-05-29 LAB — PSA: PSA: 4.3 ng/mL — AB (ref <=4.0)

## 2016-05-29 LAB — HEMOGLOBIN A1C
HEMOGLOBIN A1C: 5 % (ref <5.7)
MEAN PLASMA GLUCOSE: 97 mg/dL

## 2016-06-01 ENCOUNTER — Telehealth: Payer: Self-pay

## 2016-06-01 NOTE — Telephone Encounter (Signed)
Client returned call from RN. Client states [facility] went [facility] and they had increased his medication and his blood pressure is down and that he was now able to start work with [employer]. He is scheduled to follow up with the clinic this week again. Client states he has no further needs at this time, but will call if he does.

## 2016-06-01 NOTE — Telephone Encounter (Signed)
RN called client to follow up after his initial primary care appointment last week at the [facility]. Left a voicemail for client to call RN back.

## 2016-06-04 ENCOUNTER — Ambulatory Visit: Payer: Self-pay | Admitting: Physician Assistant

## 2016-06-11 ENCOUNTER — Encounter: Payer: Self-pay | Admitting: Physician Assistant

## 2016-06-11 ENCOUNTER — Ambulatory Visit: Payer: Self-pay | Admitting: Physician Assistant

## 2016-06-11 VITALS — BP 110/80 | HR 77 | Temp 97.3°F | Ht 70.0 in | Wt 197.0 lb

## 2016-06-11 DIAGNOSIS — N189 Chronic kidney disease, unspecified: Secondary | ICD-10-CM

## 2016-06-11 DIAGNOSIS — I10 Essential (primary) hypertension: Secondary | ICD-10-CM

## 2016-06-11 DIAGNOSIS — E785 Hyperlipidemia, unspecified: Secondary | ICD-10-CM | POA: Insufficient documentation

## 2016-06-11 DIAGNOSIS — R972 Elevated prostate specific antigen [PSA]: Secondary | ICD-10-CM | POA: Insufficient documentation

## 2016-06-11 DIAGNOSIS — N184 Chronic kidney disease, stage 4 (severe): Secondary | ICD-10-CM | POA: Insufficient documentation

## 2016-06-11 MED ORDER — LISINOPRIL-HYDROCHLOROTHIAZIDE 20-25 MG PO TABS: 1.0000 | ORAL_TABLET | Freq: Every day | ORAL | 3 refills | Status: DC

## 2016-06-11 MED ORDER — LOVASTATIN 20 MG PO TABS: 20.0000 mg | ORAL_TABLET | Freq: Every day | ORAL | 3 refills | Status: DC

## 2016-06-11 NOTE — Patient Instructions (Signed)
Fat and Cholesterol Restricted Diet High levels of fat and cholesterol in your blood may lead to various health problems, such as diseases of the heart, blood vessels, gallbladder, liver, and pancreas. Fats are concentrated sources of energy that come in various forms. Certain types of fat, including saturated fat, may be harmful in excess. Cholesterol is a substance needed by your body in small amounts. Your body makes all the cholesterol it needs. Excess cholesterol comes from the food you eat. When you have high levels of cholesterol and saturated fat in your blood, health problems can develop because the excess fat and cholesterol will gather along the walls of your blood vessels, causing them to narrow. Choosing the right foods will help you control your intake of fat and cholesterol. This will help keep the levels of these substances in your blood within normal limits and reduce your risk of disease. What is my plan? Your health care provider recommends that you:  Limit your fat intake to ______% or less of your total calories per day.  Limit the amount of cholesterol in your diet to less than _________mg per day.  Eat 20-30 grams of fiber each day.  What types of fat should I choose?  Choose healthy fats more often. Choose monounsaturated and polyunsaturated fats, such as olive and canola oil, flaxseeds, walnuts, almonds, and seeds.  Eat more omega-3 fats. Good choices include salmon, mackerel, sardines, tuna, flaxseed oil, and ground flaxseeds. Aim to eat fish at least two times a week.  Limit saturated fats. Saturated fats are primarily found in animal products, such as meats, butter, and cream. Plant sources of saturated fats include palm oil, palm kernel oil, and coconut oil.  Avoid foods with partially hydrogenated oils in them. These contain trans fats. Examples of foods that contain trans fats are stick margarine, some tub margarines, cookies, crackers, and other baked goods. What  general guidelines do I need to follow? These guidelines for healthy eating will help you control your intake of fat and cholesterol:  Check food labels carefully to identify foods with trans fats or high amounts of saturated fat.  Fill one half of your plate with vegetables and green salads.  Fill one fourth of your plate with whole grains. Look for the word "whole" as the first word in the ingredient list.  Fill one fourth of your plate with lean protein foods.  Limit fruit to two servings a day. Choose fruit instead of juice.  Eat more foods that contain fiber, such as apples, broccoli, carrots, beans, peas, and barley.  Eat more home-cooked food and less restaurant, buffet, and fast food.  Limit or avoid alcohol.  Limit foods high in starch and sugar.  Limit fried foods.  Cook foods using methods other than frying. Baking, boiling, grilling, and broiling are all great options.  Lose weight if you are overweight. Losing just 5-10% of your initial body weight can help your overall health and prevent diseases such as diabetes and heart disease.  What foods can I eat? Grains  Whole grains, such as whole wheat or whole grain breads, crackers, cereals, and pasta. Unsweetened oatmeal, bulgur, barley, quinoa, or brown rice. Corn or whole wheat flour tortillas. Vegetables  Fresh or frozen vegetables (raw, steamed, roasted, or grilled). Green salads. Fruits  All fresh, canned (in natural juice), or frozen fruits. Meats and other protein foods  Ground beef (85% or leaner), grass-fed beef, or beef trimmed of fat. Skinless chicken or turkey. Ground chicken or turkey.   Pork trimmed of fat. All fish and seafood. Eggs. Dried beans, peas, or lentils. Unsalted nuts or seeds. Unsalted canned or dry beans. Dairy  Low-fat dairy products, such as skim or 1% milk, 2% or reduced-fat cheeses, low-fat ricotta or cottage cheese, or plain low-fat yo Fats and oils  Tub margarines without trans  fats. Light or reduced-fat mayonnaise and salad dressings. Avocado. Olive, canola, sesame, or safflower oils. Natural peanut or almond butter (choose ones without added sugar and oil). The items listed above may not be a complete list of recommended foods or beverages. Contact your dietitian for more options. Foods to avoid Grains  White bread. White pasta. White rice. Cornbread. Bagels, pastries, and croissants. Crackers that contain trans fat. Vegetables  White potatoes. Corn. Creamed or fried vegetables. Vegetables in a cheese sauce. Fruits  Dried fruits. Canned fruit in light or heavy syrup. Fruit juice. Meats and other protein foods  Fatty cuts of meat. Ribs, chicken wings, bacon, sausage, bologna, salami, chitterlings, fatback, hot dogs, bratwurst, and packaged luncheon meats. Liver and organ meats. Dairy  Whole or 2% milk, cream, half-and-half, and cream cheese. Whole milk cheeses. Whole-fat or sweetened yogurt. Full-fat cheeses. Nondairy creamers and whipped toppings. Processed cheese, cheese spreads, or cheese curds. Beverages  Alcohol. Sweetened drinks (such as sodas, lemonade, and fruit drinks or punches). Fats and oils  Butter, stick margarine, lard, shortening, ghee, or bacon fat. Coconut, palm kernel, or palm oils. Sweets and desserts  Corn syrup, sugars, honey, and molasses. Candy. Jam and jelly. Syrup. Sweetened cereals. Cookies, pies, cakes, donuts, muffins, and ice cream. The items listed above may not be a complete list of foods and beverages to avoid. Contact your dietitian for more information. This information is not intended to replace advice given to you by your health care provider. Make sure you discuss any questions you have with your health care provider. Document Released: 04/20/2005 Document Revised: 05/11/2014 Document Reviewed: 07/19/2013 Elsevier Interactive Patient Education  2017 [employer]  

## 2016-06-11 NOTE — Progress Notes (Signed)
BP 110/80 (BP Location: Left Arm, Patient Position: Sitting, Cuff Size: Normal)   Pulse 77   Temp 97.3 F (36.3 C) (Other (Comment))   Ht 5\' 10"  (1.778 m)   Wt 197 lb (89.4 kg)   SpO2 99%   BMI 28.27 kg/m    Subjective:    Patient ID: [name], male    DOB: 07/28/1955, 61 y.o.   MRN: [id number]  HPI: [name] is a 61 y.o. male presenting on 06/11/2016 for Hypertension   HPI   Pt doing well.  States occassional cough with rx.    Pt states work orientation next week.  He will get insurance he thinks 90 days after he starts his job  Relevant past medical, surgical, family and social history reviewed and updated as indicated. Interim medical history since our last visit reviewed. Allergies and medications reviewed and updated.   Current Outpatient Prescriptions:  .  aspirin EC 81 MG tablet, Take 81 mg by mouth daily., Disp: , Rfl:  .  Aspirin-Acetaminophen-Caffeine (EXCEDRIN PO), Take 1 tablet by mouth daily as needed., Disp: , Rfl:  .  lisinopril-hydrochlorothiazide (PRINZIDE,ZESTORETIC) 20-25 MG tablet, Take 1 tablet by mouth daily., Disp: 30 tablet, Rfl: 0  Review of Systems  Constitutional: Negative for appetite change, chills, diaphoresis, fatigue, fever and unexpected weight change.  HENT: Negative for congestion, dental problem, drooling, ear pain, facial swelling, hearing loss, mouth sores, sneezing, sore throat, trouble swallowing and voice change.   Eyes: Negative for pain, discharge, redness, itching and visual disturbance.  Respiratory: Positive for cough. Negative for choking, shortness of breath and wheezing.   Cardiovascular: Negative for chest pain, palpitations and leg swelling.  Gastrointestinal: Negative for abdominal pain, blood in stool, constipation, diarrhea and vomiting.  Endocrine: Negative for cold intolerance, heat intolerance and polydipsia.  Genitourinary: Negative for decreased urine volume, dysuria and hematuria.  Musculoskeletal:  Negative for arthralgias, back pain and gait problem.  Skin: Negative for rash.  Allergic/Immunologic: Negative for environmental allergies.  Neurological: Negative for seizures, syncope, light-headedness and headaches.  Hematological: Negative for adenopathy.  Psychiatric/Behavioral: Negative for agitation, dysphoric mood and suicidal ideas. The patient is not nervous/anxious.     Per HPI unless specifically indicated above     Objective:    BP 110/80 (BP Location: Left Arm, Patient Position: Sitting, Cuff Size: Normal)   Pulse 77   Temp 97.3 F (36.3 C) (Other (Comment))   Ht 5\' 10"  (1.778 m)   Wt 197 lb (89.4 kg)   SpO2 99%   BMI 28.27 kg/m   Wt Readings from Last 3 Encounters:  06/11/16 197 lb (89.4 kg)  05/28/16 197 lb 12 oz (89.7 kg)  05/27/16 201 lb (91.2 kg)    Physical Exam  Constitutional: He is oriented to person, place, and time. He appears well-developed and well-nourished.  HENT:  Head: Normocephalic and atraumatic.  Neck: Neck supple.  Cardiovascular: Normal rate and regular rhythm.   Pulmonary/Chest: Effort normal and breath sounds normal. He has no wheezes.  Abdominal: Soft. Bowel sounds are normal. There is no hepatosplenomegaly. There is no tenderness.  Musculoskeletal: He exhibits no edema.  Lymphadenopathy:    He has no cervical adenopathy.  Neurological: He is alert and oriented to person, place, and time.  Skin: Skin is warm and dry.  Psychiatric: He has a normal mood and affect. His behavior is normal.  Vitals reviewed.   Results for orders placed or performed in visit on 05/28/16  Comprehensive Metabolic Panel (CMET)  Result Value Ref Range   Sodium 140 135 - 146 mmol/L   Potassium 4.5 3.5 - 5.3 mmol/L   Chloride 106 98 - 110 mmol/L   CO2 22 20 - 31 mmol/L   Glucose, Bld 97 65 - 99 mg/dL   BUN 26 (H) 7 - 25 mg/dL   Creat 2.35 (H) 0.70 - 1.33 mg/dL   Total Bilirubin 0.7 0.2 - 1.2 mg/dL   Alkaline Phosphatase 86 40 - 115 U/L   AST 12 10 -  35 U/L   ALT 8 (L) 9 - 46 U/L   Total Protein 7.5 6.1 - 8.1 g/dL   Albumin 4.1 3.6 - 5.1 g/dL   Calcium 11.0 (H) 8.6 - 10.3 mg/dL  Lipid Profile  Result Value Ref Range   Cholesterol 204 (H) <200 mg/dL   Triglycerides 173 (H) <150 mg/dL   HDL 48 >40 mg/dL   Total CHOL/HDL Ratio 4.3 <5.0 Ratio   VLDL 35 (H) <30 mg/dL   LDL Cholesterol 121 (H) <100 mg/dL  PSA  Result Value Ref Range   PSA 4.3 (H) <=4.0 ng/mL  HgB A1c  Result Value Ref Range   Hgb A1c MFr Bld 5.0 <5.7 %   Mean Plasma Glucose 97 mg/dL      Assessment & Plan:   Encounter Diagnoses  Name Primary?  . Essential hypertension Yes  . Chronic kidney disease, unspecified CKD stage   . Elevated PSA   . Hyperlipidemia, unspecified hyperlipidemia type     -reviewed labs with pt -will Refer to urology for elevated PSA -discussed cholesterol with pt.  Since pt already exercises regularly and eats lowfat diet will start low dose statin. Gave handout on lowfat diet -will Refer to nephrology for CKD -Pt given [employer] discount application -discussed bp medication and cough.  Offered pt to change medications today or recheck in one month.  He preferred to wait a month -follow up one month.  RTO sooner prn

## 2016-07-09 ENCOUNTER — Ambulatory Visit: Payer: Self-pay | Admitting: Physician Assistant

## 2016-07-16 ENCOUNTER — Ambulatory Visit: Payer: Self-pay | Admitting: Physician Assistant

## 2016-07-23 ENCOUNTER — Ambulatory Visit: Payer: Self-pay | Admitting: Physician Assistant

## 2016-07-28 ENCOUNTER — Encounter: Payer: Self-pay | Admitting: Physician Assistant

## 2016-10-21 ENCOUNTER — Ambulatory Visit: Payer: Self-pay | Admitting: Urology

## 2016-10-25 ENCOUNTER — Emergency Department (HOSPITAL_COMMUNITY)
Admission: EM | Admit: 2016-10-25 | Discharge: 2016-10-25 | Disposition: A | Discharge status: Home / Self Care | Payer: BLUE CROSS/BLUE SHIELD | Attending: Emergency Medicine | Admitting: Emergency Medicine

## 2016-10-25 ENCOUNTER — Encounter (HOSPITAL_COMMUNITY): Payer: Self-pay | Admitting: Emergency Medicine

## 2016-10-25 ENCOUNTER — Emergency Department (HOSPITAL_COMMUNITY): Payer: BLUE CROSS/BLUE SHIELD

## 2016-10-25 DIAGNOSIS — I129 Hypertensive chronic kidney disease with stage 1 through stage 4 chronic kidney disease, or unspecified chronic kidney disease: Secondary | ICD-10-CM | POA: Diagnosis not present

## 2016-10-25 DIAGNOSIS — Z7982 Long term (current) use of aspirin: Secondary | ICD-10-CM | POA: Diagnosis not present

## 2016-10-25 DIAGNOSIS — M25462 Effusion, left knee: Secondary | ICD-10-CM | POA: Insufficient documentation

## 2016-10-25 DIAGNOSIS — Z87891 Personal history of nicotine dependence: Secondary | ICD-10-CM | POA: Insufficient documentation

## 2016-10-25 DIAGNOSIS — M25562 Pain in left knee: Secondary | ICD-10-CM | POA: Diagnosis present

## 2016-10-25 DIAGNOSIS — I10 Essential (primary) hypertension: Secondary | ICD-10-CM

## 2016-10-25 DIAGNOSIS — N189 Chronic kidney disease, unspecified: Secondary | ICD-10-CM | POA: Insufficient documentation

## 2016-10-25 DIAGNOSIS — Z760 Encounter for issue of repeat prescription: Secondary | ICD-10-CM | POA: Diagnosis not present

## 2016-10-25 MED ORDER — LIDOCAINE HCL (PF) 2 % IJ SOLN
INTRAMUSCULAR | Status: AC
  Filled 2016-10-25: qty 10

## 2016-10-25 MED ORDER — HYDROCHLOROTHIAZIDE 25 MG PO TABS
25.0000 mg | ORAL_TABLET | Freq: Once | ORAL | Status: AC
  Administered 2016-10-25: 25 mg via ORAL
  Filled 2016-10-25: qty 1

## 2016-10-25 MED ORDER — LISINOPRIL-HYDROCHLOROTHIAZIDE 20-25 MG PO TABS: 1.0000 | ORAL_TABLET | Freq: Every day | ORAL | 0 refills | Status: DC

## 2016-10-25 MED ORDER — POVIDONE-IODINE 10 % EX SOLN
CUTANEOUS | Status: AC
  Filled 2016-10-25: qty 118

## 2016-10-25 MED ORDER — OXYCODONE-ACETAMINOPHEN 5-325 MG PO TABS
1.0000 | ORAL_TABLET | Freq: Once | ORAL | Status: AC
Start: 2016-10-25 — End: 2016-10-25
  Administered 2016-10-25: 1 via ORAL
  Filled 2016-10-25: qty 1

## 2016-10-25 MED ORDER — OXYCODONE-ACETAMINOPHEN 5-325 MG PO TABS: 1.0000 | ORAL_TABLET | ORAL | 0 refills | Status: DC | PRN

## 2016-10-25 MED ORDER — LISINOPRIL 10 MG PO TABS
20.0000 mg | ORAL_TABLET | Freq: Once | ORAL | Status: AC
  Administered 2016-10-25: 20 mg via ORAL
  Filled 2016-10-25: qty 2

## 2016-10-25 NOTE — ED Notes (Signed)
Knee immobilizer on-   Discharge delayed to recheck med effectiveness of BP

## 2016-10-25 NOTE — Discharge Instructions (Signed)
You have an injury inside your knee joint which has caused the pain and swelling.  As discussed, your xray is negative for fractures tonight but I suspect either a cartilage or ligament injury as the source of this pain and fluid collection.  Wear the knee immobilizer and use your crutches to avoid bending the knee and weight bearing.  Ice applied to the knee is also recommended.  You may take the percocet prescribed for pain relief.  This will make you drowsy - do not drive within 4 hours of taking this medication.  Call Dr. Aline Brochure in the morning for an appointment this week.  You also need to have your blood pressure rechecked as it was very high tonight.  Your blood pressure medicine has been refilled for you.

## 2016-10-25 NOTE — ED Provider Notes (Signed)
[facility] DEPT Provider Note   CSN: [id number] Arrival date & time: 10/25/16  1656     History   Chief Complaint Chief Complaint  Patient presents with  . Knee Pain    HPI [name] is a 61 y.o. male presenting with pain and swelling in his left knee which developed 2 days ago when working.  He describes planting and twisting his left leg causing immediate pain which has slowly progressed to increased pain and swelling with decreased ability to weight bear.  He is currently using a cane to help ambulate.  There is no radiation of pain.  He denies prior problems with his knee including pain or injury.  He reports severe pain at this time.    Additionally, noted elevated bp.  He reports running out of his bp medicine, last took 3 days ago.  He is unsure if he has refills at his pharmacy.    HPI  Past Medical History:  Diagnosis Date  . Hypertension 2002  . Kidney stones     Patient Active Problem List   Diagnosis Date Noted  . Chronic kidney disease 06/11/2016  . Elevated PSA 06/11/2016  . Hyperlipidemia 06/11/2016  . Essential hypertension 05/28/2016    Past Surgical History:  Procedure Laterality Date  . AMPUTATION TOE Left 1991   2 digit of left foot  . CHOLECYSTECTOMY    . KIDNEY STONE SURGERY Right        Home Medications    Prior to Admission medications   Medication Sig Start Date End Date Taking? Authorizing Provider  aspirin EC 81 MG tablet Take 81 mg by mouth daily.    [provider]  Aspirin-Acetaminophen-Caffeine (EXCEDRIN PO) Take 1 tablet by mouth daily as needed.    [provider]  lisinopril-hydrochlorothiazide (PRINZIDE,ZESTORETIC) 20-25 MG tablet Take 1 tablet by mouth daily. 10/25/16   Evalee Jefferson, PA-C  lovastatin (MEVACOR) 20 MG tablet Take 1 tablet (20 mg total) by mouth at bedtime. 06/11/16   Soyla Dryer, PA-C  oxyCODONE-acetaminophen (PERCOCET/ROXICET) 5-325 MG tablet Take 1 tablet by mouth every 4 (four)  hours as needed. 10/25/16   Evalee Jefferson, PA-C    Family History Family History  Problem Relation Age of Onset  . Heart failure Mother   . Heart disease Mother   . Cancer Mother        breast  . Heart attack Father   . Hypertension Father   . Heart disease Father     Social History Social History  Substance Use Topics  . Smoking status: Former Smoker    Packs/day: 0.50    Years: 40.00    Types: Cigarettes    Quit date: 05/25/2016  . Smokeless tobacco: Never Used  . Alcohol use No     Allergies   Patient has no known allergies.   Review of Systems Review of Systems  Constitutional: Negative for fever.  Musculoskeletal: Positive for arthralgias, gait problem and joint swelling. Negative for myalgias.  Skin: Negative.  Negative for color change and wound.  Neurological: Negative for weakness and numbness.     Physical Exam Updated Vital Signs BP (!) 149/108 (BP Location: Right Arm)   Pulse 89   Temp 98.1 F (36.7 C) (Oral)   Resp 20   Ht 6' (1.829 m)   Wt 84.8 kg (187 lb)   SpO2 99%   BMI 25.36 kg/m   Physical Exam  Constitutional: He appears well-developed and well-nourished.  HENT:  Head: Atraumatic.  Neck:  Normal range of motion.  Cardiovascular:  Pulses equal bilaterally  Musculoskeletal: He exhibits edema and tenderness.  Large left knee effusion.  Pt unable to extend his knee secondary to pain. There is no erythema, red streaking, skin intact.  No deformity. Most tender medial and lateral joint space but diffusely tender.  Neurological: He is alert. He has normal strength. He displays normal reflexes. No sensory deficit.  Skin: Skin is warm and dry.  Psychiatric: He has a normal mood and affect.     ED Treatments / Results  Labs (all labs ordered are listed, but only abnormal results are displayed) Labs Reviewed  CULTURE, BODY FLUID-BOTTLE    EKG  EKG Interpretation None       Radiology Dg Knee Complete 4 Views Left  Result Date:  10/25/2016 CLINICAL DATA:  Knee pain and swelling EXAM: LEFT KNEE - COMPLETE 4+ VIEW COMPARISON:  09/30/2004 FINDINGS: No fracture or dislocation. Moderate degenerative changes of the medial and lateral joint space compartments with patellofemoral degenerative changes. Moderate to large joint effusion with diffuse soft [name] swelling. Soft [name] calcifications anterior to the tibial tuberosity. A amorphous calcifications anterior to the joint space and adjacent to the patella, possibly representing calcified loose bodies. IMPRESSION: 1. No acute fracture or malalignment 2. Moderate degenerative changes 3. Moderate to large joint effusion. Multiple calcifications along the superior and inferior aspect of the knee, which may represent calcified loose bodies. Electronically Signed   By: Donavan Foil M.D.   On: 10/25/2016 18:33    Procedures .Joint Aspiration/Arthrocentesis Date/Time: 10/25/2016 7:44 PM Performed by: Evalee Jefferson Authorized by: Evalee Jefferson   Consent:    Consent obtained:  Verbal   Consent given by:  Patient   Risks discussed:  Infection, pain and incomplete drainage   Alternatives discussed:  No treatment and referral Location:    Location:  Knee Anesthesia (see MAR for exact dosages):    Anesthesia method:  Local infiltration   Local anesthetic:  Lidocaine 2% w/o epi Procedure details:    Preparation: Patient was prepped and draped in usual sterile fashion     Needle gauge:  70 G   Ultrasound guidance: no     Approach:  Superior   Aspirate characteristics:  Blood-tinged   Steroid injected: no     Specimen collected: yes   Post-procedure details:    Dressing:  Adhesive bandage Comments:     Knee immobilizer, crutches given.  Culture and gram stain sent.  55 cc of blood tinged fluid aspirated.     (including critical care time)   Medications Ordered in ED Medications  oxyCODONE-acetaminophen (PERCOCET/ROXICET) 5-325 MG per tablet 1 tablet (1 tablet Oral Given  10/25/16 1925)  lisinopril (PRINIVIL,ZESTRIL) tablet 20 mg (20 mg Oral Given 10/25/16 1926)  hydrochlorothiazide (HYDRODIURIL) tablet 25 mg (25 mg Oral Given 10/25/16 1926)     Initial Impression / Assessment and Plan / ED Course  I have reviewed the triage vital signs and the nursing notes.  Pertinent labs & imaging results that were available during my care of the patient were reviewed by me and considered in my medical decision making (see chart for details).     Images reviewed and discussed with pt. RICE, knee immobilizer, crutches provided. Referral to Dr. Aline Brochure, pt to call for ov for further management. He was also given refill of bp med, discussed ov with pcp for  Recheck of bp and further refills.  Final Clinical Impressions(s) / ED Diagnoses   Final diagnoses:  Effusion of left knee  Essential hypertension  Medication refill    New Prescriptions Discharge Medication List as of 10/25/2016  8:00 PM    START taking these medications   Details  oxyCODONE-acetaminophen (PERCOCET/ROXICET) 5-325 MG tablet Take 1 tablet by mouth every 4 (four) hours as needed., Starting Sun 10/25/2016, Print         Evalee Jefferson, PA-C 10/26/16 [place], [place], DO 10/28/16 1616

## 2016-10-25 NOTE — ED Notes (Signed)
Pt requested ace wrap- given As well as socks Crutches and knee immobilizer with demonstration of usage  Pt told this reporter that he planned to work in the am He also reports no PCP He is encouraged not to work, as well as reminded of his work note, need to follow with Dr Alyson Ingles, PA also encouraged not to work in the am with reasoning provided

## 2016-10-25 NOTE — ED Triage Notes (Signed)
Patient c/o left knee pain and swelling. Per patient twisted ankle 2 days ago at work. Patient states that he worked again last night aggravating the pain more. Denies taking anything for pain.

## 2016-10-30 ENCOUNTER — Ambulatory Visit: Payer: BLUE CROSS/BLUE SHIELD | Admitting: Orthopedic Surgery

## 2016-10-31 LAB — CULTURE, BODY FLUID W GRAM STAIN -BOTTLE
Culture: NO GROWTH
Special Requests: ADEQUATE

## 2016-10-31 LAB — CULTURE, BODY FLUID-BOTTLE

## 2016-11-16 ENCOUNTER — Ambulatory Visit (INDEPENDENT_AMBULATORY_CARE_PROVIDER_SITE_OTHER): Payer: BLUE CROSS/BLUE SHIELD | Admitting: Orthopedic Surgery

## 2016-11-16 ENCOUNTER — Encounter: Payer: Self-pay | Admitting: Orthopedic Surgery

## 2016-11-16 VITALS — BP 157/100 | HR 83 | Temp 97.3°F | Ht 71.0 in | Wt 195.0 lb

## 2016-11-16 DIAGNOSIS — M25562 Pain in left knee: Secondary | ICD-10-CM | POA: Diagnosis not present

## 2016-11-16 MED ORDER — HYDROCODONE-ACETAMINOPHEN 5-325 MG PO TABS: 1.0000 | ORAL_TABLET | Freq: Four times a day (QID) | ORAL | 0 refills | Status: DC | PRN

## 2016-11-16 NOTE — Progress Notes (Signed)
New Office Visit   Chief Complaint  Patient presents with  . Knee Pain    left knee pain      61 year old male presents with a three-week history of a new onset pain in his left knee with a step off a ladder at work. He went to the ER he had an effusion had some pain which was relieved with oxycodone  He says his knee is much better and he wanted to go back to work tonight no restrictions  Initially the pain was fairly severe constant associated with swelling but no catching locking or giving way    Review of Systems  Constitutional: Negative for fever.  Musculoskeletal: Positive for joint pain.  Neurological: Negative for tingling.   Past Medical History:  Diagnosis Date  . Hypertension 2002  . Kidney stones     BP (!) 157/100   Pulse 83   Temp (!) 97.3 F (36.3 C)   Ht 5\' 11"  (1.803 m)   Wt 195 lb (88.5 kg)   BMI 27.20 kg/m  Physical Exam  Constitutional: He is oriented to person, place, and time. He appears well-developed and well-nourished.  Vital signs have been reviewed and are stable. Gen. appearance the patient is well-developed and well-nourished with normal grooming and hygiene.   Musculoskeletal:  GAIT IS normal  Neurological: He is alert and oriented to person, place, and time.  Skin: Skin is warm and dry. No erythema.  Psychiatric: He has a normal mood and affect.  Vitals reviewed.  Left Knee Exam  Swelling: None Effusion: Yes  Tenderness  None  Range of Motion  Extension: -5 Flexion:     130  Muscle Strength  Normal left knee strength  Tests  McMurrays:  Medial - Negative      Lateral - Negative Drawer:       Anterior - Negative     Posterior - Negative  Right Knee Exam  Effusion: No  Tenderness  None  Range of Motion  Normal right knee ROM Extension: Normal Flexion:     Normal  Muscle Strength  Normal right knee strength  Tests  Lachman:  Posterior - Negative    The patient's x-ray shows an effusion with extra-articular  bone although this was read as possible loose bodies  Encounter Diagnosis  Name Primary?  . Acute pain of left knee Yes    He is improving. He would like to back to work. He would like something for pain  Meds ordered this encounter  Medications  . HYDROcodone-acetaminophen (NORCO) 5-325 MG tablet    Sig: Take 1 tablet by mouth every 6 (six) hours as needed for moderate pain.    Dispense:  60 tablet    Refill:  0    He should continue to improve over the next 4-5 days and he is approved to go back to work

## 2016-11-16 NOTE — Patient Instructions (Addendum)
Return to work tonight no restrictions  Knee Pain, Adult Many things can cause knee pain. The pain often goes away on its own with time and rest. If the pain does not go away, tests may be done to find out what is causing the pain. Follow these instructions at home: Activity  Rest your knee.  Do not do things that cause pain.  Avoid activities where both feet leave the ground at the same time (high-impact activities). Examples are running, jumping rope, and doing jumping jacks. General instructions  Take medicines only as told by your doctor.  Raise (elevate) your knee when you are resting. Make sure your knee is higher than your heart.  Sleep with a pillow under your knee.  If told, put ice on the knee: ? Put ice in a plastic bag. ? Place a towel between your skin and the bag. ? Leave the ice on for 20 minutes, 2-3 times a day.  Ask your doctor if you should wear an elastic knee support.  Lose weight if you are overweight. Being overweight can make your knee hurt more.  Do not use any tobacco products. These include cigarettes, chewing tobacco, or electronic cigarettes. If you need help quitting, ask your doctor. Smoking may slow down healing. Contact a doctor if:  The pain does not stop.  The pain changes or gets worse.  You have a fever along with knee pain.  Your knee gives out or locks up.  Your knee swells, and becomes worse. Get help right away if:  Your knee feels warm.  You cannot move your knee.  You have very bad knee pain.  You have chest pain.  You have trouble breathing. Summary  Many things can cause knee pain. The pain often goes away on its own with time and rest.  Avoid activities that put stress on your knee. These include running and jumping rope.  Get help right away if you cannot move your knee, or if your knee feels warm, or if you have trouble breathing. This information is not intended to replace advice given to you by your health  care provider. Make sure you discuss any questions you have with your health care provider. Document Released: 07/17/2008 Document Revised: 04/14/2016 Document Reviewed: 04/14/2016 Elsevier Interactive Patient Education  2017 [employer].

## 2017-02-03 ENCOUNTER — Ambulatory Visit: Payer: Self-pay | Admitting: Family Medicine

## 2017-02-05 ENCOUNTER — Encounter: Payer: Self-pay | Admitting: Family Medicine

## 2018-05-30 ENCOUNTER — Emergency Department (HOSPITAL_COMMUNITY): Payer: Self-pay

## 2018-05-30 ENCOUNTER — Other Ambulatory Visit: Payer: Self-pay

## 2018-05-30 ENCOUNTER — Encounter (HOSPITAL_COMMUNITY): Payer: Self-pay | Admitting: Emergency Medicine

## 2018-05-30 ENCOUNTER — Emergency Department (HOSPITAL_COMMUNITY)
Admission: EM | Admit: 2018-05-30 | Discharge: 2018-05-31 | Disposition: A | Discharge status: Home / Self Care | Payer: Self-pay | Attending: Emergency Medicine | Admitting: Emergency Medicine

## 2018-05-30 DIAGNOSIS — E785 Hyperlipidemia, unspecified: Secondary | ICD-10-CM | POA: Insufficient documentation

## 2018-05-30 DIAGNOSIS — F17210 Nicotine dependence, cigarettes, uncomplicated: Secondary | ICD-10-CM | POA: Insufficient documentation

## 2018-05-30 DIAGNOSIS — I10 Essential (primary) hypertension: Secondary | ICD-10-CM | POA: Insufficient documentation

## 2018-05-30 DIAGNOSIS — M25462 Effusion, left knee: Secondary | ICD-10-CM | POA: Insufficient documentation

## 2018-05-30 DIAGNOSIS — M1712 Unilateral primary osteoarthritis, left knee: Secondary | ICD-10-CM | POA: Insufficient documentation

## 2018-05-30 HISTORY — DX: Pain in left knee: M25.562

## 2018-05-30 MED ORDER — LIDOCAINE HCL (PF) 2 % IJ SOLN
INTRAMUSCULAR | Status: AC
  Filled 2018-05-30: qty 20

## 2018-05-30 MED ORDER — OXYCODONE-ACETAMINOPHEN 5-325 MG PO TABS: 1.0000 | ORAL_TABLET | ORAL | 0 refills | Status: DC | PRN

## 2018-05-30 MED ORDER — POVIDONE-IODINE 10 % EX SOLN
CUTANEOUS | Status: DC | PRN
  Filled 2018-05-30: qty 15

## 2018-05-30 MED ORDER — IBUPROFEN 800 MG PO TABS
800.0000 mg | ORAL_TABLET | Freq: Once | ORAL | Status: AC
  Administered 2018-05-30: 800 mg via ORAL

## 2018-05-30 MED ORDER — IBUPROFEN 800 MG PO TABS
ORAL_TABLET | ORAL | Status: AC
  Filled 2018-05-30: qty 1

## 2018-05-30 MED ORDER — OXYCODONE-ACETAMINOPHEN 5-325 MG PO TABS
1.0000 | ORAL_TABLET | Freq: Once | ORAL | Status: AC
  Administered 2018-05-31: 1 via ORAL
  Filled 2018-05-30: qty 1

## 2018-05-30 MED ORDER — LIDOCAINE HCL (PF) 2 % IJ SOLN: 10.0000 mL | Freq: Once | INTRAMUSCULAR | Status: DC

## 2018-05-30 NOTE — ED Triage Notes (Signed)
Patient reports left knee pain and edema, started 2 days ago.

## 2018-05-30 NOTE — Discharge Instructions (Signed)
As discussed you have a knee effusion which is probably from your arthritis changes seen on your x-ray.  Use the knee immobilizer while awake and walking to minimize movement of the knee joint which will help this effusion resolve.  You probably need to see an orthopedist as discussed for further evaluation of your arthritis.  You may call Dr. Marlou Sa for follow-up care.  If you prefer to keep your medical care local to [place], Dr. Aline Brochure may also be available.  Do not drive within 4 hours of taking the oxycodone as this medication will make you drowsy.  Apply heating pad to your knee for 20 minutes several times daily this can also help with pain.

## 2018-05-31 NOTE — ED Provider Notes (Signed)
[facility] Provider Note   CSN: [id number] Arrival date & time: 05/30/18  1654     History   Chief Complaint Chief Complaint  Patient presents with  . Knee Pain    HPI [name] is a 63 y.o. male with a history of chronic intermittent left knee pain since he sustained a work-related injury several years ago presenting with increased pain and swelling of the left knee joint.  He denies any new injury or overuse.  His symptoms escalated 2 days ago.  He denies history of gout and has had no fevers or chills, no wounds or changes in his skin, erythema or red streaking.  Pain is worsened with attempts to fully extend the knee.  He denies weakness of the knee joint.  He has used rest and Tylenol without symptom relief.  The history is provided by the patient.    Past Medical History:  Diagnosis Date  . Hypertension 2002  . Kidney stones   . Left knee pain     Patient Active Problem List   Diagnosis Date Noted  . Chronic kidney disease 06/11/2016  . Elevated PSA 06/11/2016  . Hyperlipidemia 06/11/2016  . Essential hypertension 05/28/2016    Past Surgical History:  Procedure Laterality Date  . AMPUTATION TOE Left 1991   2 digit of left foot  . CHOLECYSTECTOMY    . KIDNEY STONE SURGERY Right         Home Medications    Prior to Admission medications   Medication Sig Start Date End Date Taking? Authorizing Provider  aspirin-acetaminophen-caffeine (EXCEDRIN EXTRA STRENGTH) [phone] MG tablet Take 2 tablets by mouth daily as needed for headache.   Yes [provider]  oxyCODONE-acetaminophen (PERCOCET/ROXICET) 5-325 MG tablet Take 1 tablet by mouth every 4 (four) hours as needed. 05/30/18   Evalee Jefferson, PA-C    Family History Family History  Problem Relation Age of Onset  . Heart failure Mother   . Heart disease Mother   . Cancer Mother        breast  . Heart attack Father   . Hypertension Father   . Heart disease Father      Social History Social History   Tobacco Use  . Smoking status: Current Some Day Smoker    Packs/day: 0.50    Years: 40.00    Pack years: 20.00    Types: Cigarettes    Last attempt to quit: 05/25/2016    Years since quitting: 2.0  . Smokeless tobacco: Never Used  Substance Use Topics  . Alcohol use: No  . Drug use: No     Allergies   Patient has no known allergies.   Review of Systems Review of Systems  Constitutional: Negative for fever.  Musculoskeletal: Positive for arthralgias and joint swelling. Negative for myalgias.  Skin: Negative for color change and wound.  Neurological: Negative for weakness and numbness.     Physical Exam Updated Vital Signs BP (!) 168/126 (BP Location: Right Arm)   Pulse 98   Temp 98.1 F (36.7 C) (Oral)   Resp 16   Ht 5' 11.5" (1.816 m)   Wt 88 kg   SpO2 100%   BMI 26.68 kg/m   Physical Exam Constitutional:      Appearance: He is well-developed.  HENT:     Head: Atraumatic.  Neck:     Musculoskeletal: Normal range of motion.  Cardiovascular:     Comments: Pulses equal bilaterally Musculoskeletal:  General: Swelling and tenderness present.     Left knee: He exhibits effusion. Tenderness found.     Comments: Small effusion present.  No joint instability on valgus/varus strain, negative drawer, although limited secondary to pain.    Skin:    General: Skin is warm and dry.  Neurological:     Mental Status: He is alert.     Sensory: No sensory deficit.     Deep Tendon Reflexes: Reflexes normal.      ED Treatments / Results  Labs (all labs ordered are listed, but only abnormal results are displayed) Labs Reviewed - No data to display  EKG None  Radiology Dg Knee Complete 4 Views Left  Result Date: 05/30/2018 CLINICAL DATA:  63 year old male with a history of pain EXAM: LEFT KNEE - COMPLETE 4+ VIEW COMPARISON:  10/25/2016 FINDINGS: No acute displaced fracture Joint space narrowing at the lateral greater  than medial compartment with marginal osteophyte formation. Degenerative changes at the patellofemoral joint. Joint effusion. The lateral view demonstrates corticated fragments at the joint space. These were present on the comparison. IMPRESSION: Negative for acute bony abnormality. Advanced tricompartmental osteoarthritis, which is worst at the lateral compartment. Joint effusion. Evidence of intra-articular bodies. Electronically Signed   By: Corrie Mckusick D.O.   On: 05/30/2018 18:16    Procedures .Joint Aspiration/Arthrocentesis Date/Time: 05/30/2018 10:50 PM Performed by: Evalee Jefferson, PA-C Authorized by: Evalee Jefferson, PA-C   Consent:    Consent obtained:  Verbal   Consent given by:  Patient   Risks discussed:  Bleeding, incomplete drainage, infection and pain   Alternatives discussed:  No treatment and alternative treatment Location:    Location:  Knee   Knee:  L knee Anesthesia (see MAR for exact dosages):    Anesthesia method:  Local infiltration   Local anesthetic:  Lidocaine 2% w/o epi Procedure details:    Preparation: Patient was prepped and draped in usual sterile fashion     Needle gauge:  18 G   Ultrasound guidance: no     Approach:  Medial   Aspirate amount:  Scant, 1 cc   Aspirate characteristics:  Clear and blood-tinged   Steroid injected: no     Specimen collected: no   Post-procedure details:    Dressing:  Adhesive bandage   Patient tolerance of procedure:  Tolerated with difficulty Comments:     Painful for pt, unable to find significant fluid collection.   (including critical care time)  Pt requested arthrocentesis for pain relief. Has had previously with sig relief of pain.    Medications Ordered in ED Medications  ibuprofen (ADVIL,MOTRIN) tablet 800 mg (800 mg Oral Given 05/30/18 2135)  oxyCODONE-acetaminophen (PERCOCET/ROXICET) 5-325 MG per tablet 1 tablet (1 tablet Oral Given 05/31/18 0022)     Initial Impression / Assessment and Plan / ED Course   I have reviewed the triage vital signs and the nursing notes.  Pertinent labs & imaging results that were available during my care of the patient were reviewed by me and considered in my medical decision making (see chart for details).     Pt with significant pain in the left knee with small effusion, no sign of septic joint. He was prescribed oxycodone after review of the [place] narcotic database. Discussed heat tx, rest. Knee immobilizer prescribed.  [facility] a [facility], reports on several meds for htn but unable to name, has not taken his meds today.  Advised bp sig elevated, felt secondary to pain,  advised he needs to take his meds as soon as he is home. He denies cp, headache, vision changes.  He has no peripheral edema.  Referral to ortho for f/u care.   Final Clinical Impressions(s) / ED Diagnoses   Final diagnoses:  Effusion of left knee  Primary osteoarthritis of left knee    ED Discharge Orders         Ordered    oxyCODONE-acetaminophen (PERCOCET/ROXICET) 5-325 MG tablet  Every 4 hours PRN     05/30/18 2332           Evalee Jefferson, PA-C 05/31/18 1212    Milton Ferguson, MD 05/31/18 1458

## 2018-12-01 ENCOUNTER — Other Ambulatory Visit: Payer: Self-pay

## 2018-12-01 DIAGNOSIS — Z20822 Contact with and (suspected) exposure to covid-19: Secondary | ICD-10-CM

## 2018-12-01 NOTE — Progress Notes (Signed)
c 

## 2018-12-04 LAB — NOVEL CORONAVIRUS, NAA: SARS-CoV-2, NAA: NOT DETECTED

## 2019-02-08 ENCOUNTER — Ambulatory Visit: Payer: Medicaid Other | Admitting: Orthopedic Surgery

## 2019-02-08 ENCOUNTER — Telehealth: Payer: Self-pay | Admitting: Radiology

## 2019-02-08 ENCOUNTER — Other Ambulatory Visit: Payer: Self-pay

## 2019-02-08 VITALS — BP 155/109 | HR 99 | Temp 97.3°F | Ht 71.5 in | Wt 200.4 lb

## 2019-02-08 DIAGNOSIS — M1712 Unilateral primary osteoarthritis, left knee: Secondary | ICD-10-CM | POA: Diagnosis not present

## 2019-02-08 DIAGNOSIS — M1710 Unilateral primary osteoarthritis, unspecified knee: Secondary | ICD-10-CM

## 2019-02-08 MED ORDER — MELOXICAM 7.5 MG PO TABS: 7.5000 mg | ORAL_TABLET | Freq: Every day | ORAL | 5 refills | Status: DC

## 2019-02-08 NOTE — Patient Instructions (Addendum)
You have received an injection of steroids into the joint. 15% of patients will have increased pain within the 24 hours postinjection.   This is transient and will go away.   We recommend that you use ice packs on the injection site for 20 minutes every 2 hours and extra strength Tylenol 2 tablets every 8 as needed until the pain resolves.  If you continue to have pain after taking the Tylenol and using the ice please call the office for further instructions.    Estimate of TKA is $40,000 I m not sure how much it will cost him personally. What is his deductible

## 2019-02-08 NOTE — Progress Notes (Signed)
Chief Complaint  Patient presents with  . lt knee pain    started in july?   63 year old male last seen 2018 started having pain again in July presents with pain and swelling left knee he says it is pretty severe he is also noticed deformity of his left knee weakness and difficulty weightbearing or standing for long periods of time  Review of systems currently no chest pain shortness of breath weight loss or rash  Past Medical History:  Diagnosis Date  . Hypertension 2002  . Kidney stones   . Left knee pain     Past Surgical History:  Procedure Laterality Date  . AMPUTATION TOE Left 1991   2 digit of left foot  . CHOLECYSTECTOMY    . KIDNEY STONE SURGERY Right     BP (!) 155/109 Comment: out of BP meds  Pulse 99   Temp (!) 97.3 F (36.3 C)   Ht 5' 11.5" (1.816 m)   Wt 200 lb 6.4 oz (90.9 kg)   BMI 27.56 kg/m   Normal weight normal appearance oriented x3 mood pleasant affect normal gait and station shows a valgus knee slight limp large effusion left knee tenderness lateral joint line range of motion is limited by pain and swelling knee feels stable strength is normal muscle tone excellent skin is clean dry and intact without erythema normal distal pulses with normal temperature and normal sensation in the left leg coordination and balance are normal   Encounter Diagnosis  Name Primary?  . Primary localized osteoarthritis of knee - left knee Yes     Procedure note injection and aspiration left knee joint  Verbal consent was obtained to aspirate and inject the left knee joint   Timeout was completed to confirm the site of aspiration and injection  An 18-gauge needle was used to aspirate the left knee joint from a suprapatellar lateral approach.  The medications used were 40 mg of Depo-Medrol and 1% lidocaine 3 cc  Anesthesia was provided by ethyl chloride and the skin was prepped with alcohol.  After cleaning the skin with alcohol an 18-gauge needle was used to  aspirate the right knee joint.  We obtained 40 cc of fluid clear yellow  We followed this by injection of 40 mg of Depo-Medrol and 3 cc 1% lidocaine.  There were no complications. A sterile bandage was applied.  He will come back in a week he may consider knee replacement

## 2019-02-08 NOTE — Telephone Encounter (Signed)
Called for lab pick up conf # [id number]

## 2019-02-10 ENCOUNTER — Encounter: Payer: Self-pay | Admitting: Orthopedic Surgery

## 2019-02-10 ENCOUNTER — Telehealth: Payer: Self-pay | Admitting: Orthopedic Surgery

## 2019-02-10 NOTE — Telephone Encounter (Signed)
I spoke with [facility] pre-service center, [place], and obtained the information for patient; relayed.

## 2019-02-10 NOTE — Telephone Encounter (Signed)
He will have to contact the hospital, I do not do this

## 2019-02-10 NOTE — Telephone Encounter (Signed)
Patient called following his office visit 02/08/19; states he now needs fee information for surgery only, for total knee replacement (cpt code [phone]). Said if he can get something in writing today, it would be greatly appreciated.

## 2019-02-13 LAB — SYNOVIAL CELL COUNT + DIFF, W/ CRYSTALS
Basophils, %: 0 %
Eosinophils-Synovial: 0 % (ref 0–2)
Lymphocytes-Synovial Fld: 26 % (ref 0–74)
Monocyte/Macrophage: 60 % (ref 0–69)
Neutrophil, Synovial: 10 % (ref 0–24)
Synoviocytes, %: 0 % (ref 0–15)
WBC, Synovial: 19610 cells/uL — ABNORMAL HIGH (ref <150)

## 2019-02-13 LAB — WOUND CULTURE
MICRO NUMBER:: 964425
RESULT:: NO GROWTH
SPECIMEN QUALITY:: ADEQUATE

## 2019-02-15 ENCOUNTER — Telehealth: Payer: Self-pay | Admitting: Orthopedic Surgery

## 2019-02-15 ENCOUNTER — Other Ambulatory Visit: Payer: Self-pay

## 2019-02-15 ENCOUNTER — Encounter: Payer: Self-pay | Admitting: Orthopedic Surgery

## 2019-02-15 ENCOUNTER — Encounter

## 2019-02-15 ENCOUNTER — Ambulatory Visit: Payer: Self-pay | Admitting: Orthopedic Surgery

## 2019-02-15 ENCOUNTER — Ambulatory Visit: Payer: Medicaid Other | Admitting: Orthopedic Surgery

## 2019-02-15 VITALS — BP 169/114 | HR 89 | Ht 71.5 in | Wt 194.0 lb

## 2019-02-15 DIAGNOSIS — M1710 Unilateral primary osteoarthritis, unspecified knee: Secondary | ICD-10-CM

## 2019-02-15 MED ORDER — DICLOFENAC SODIUM 75 MG PO TBEC: 75.0000 mg | DELAYED_RELEASE_TABLET | Freq: Two times a day (BID) | ORAL | 2 refills | Status: DC

## 2019-02-15 MED ORDER — TRAMADOL-ACETAMINOPHEN 37.5-325 MG PO TABS: 1.0000 | ORAL_TABLET | ORAL | 5 refills | Status: DC | PRN

## 2019-02-15 NOTE — Progress Notes (Signed)
Chief Complaint  Patient presents with  . Knee Pain    left getting worse not better with meloxicam   . Medication Refill    requesting Oxycodone    63 year old male had knee aspiration he has a lot of white cells in his fluid no crystals seen  Examined increased pain at night when he is lying down.  He is wanting to have knee replacement but his blood pressures too high and he has stopped taking his blood pressure medicines  He would like some oxycodone for pain I explained to him that this is for after surgery and only for short periods of time it is dangerous and can cause respiratory depression and easy addiction  Review of Systems  Constitutional: Negative for fever.   Physical Exam  BP (!) 169/114   Pulse 89   Ht 5' 11.5" (1.816 m)   Wt 194 lb (88 kg)   BMI 26.68 kg/m   Left knee has no joint effusion is tender in the lateral compartment the knee is not warm to touch the skin is nonerythematous he has valgus alignment to the joint  He ambulates with varus thrust medially.  Knee is stable muscle tone is normal overall range of motion with flexion contracture is approximately 120 degrees ligaments remain stable   I am going to switch him from meloxicam to diclofenac  I will also place him on Ultracet  I sent a letter to Dr. Wolfgang Phoenix for preop clearance and once that is done we can proceed with surgery  His white cell count in the fluid was 19,000 but only 10% neutrophils so I think it is okay to proceed with the surgery once the blood pressure is down  Meds ordered this encounter  Medications  . traMADol-acetaminophen (ULTRACET) 37.5-325 MG tablet    Sig: Take 1 tablet by mouth every 4 (four) hours as needed.    Dispense:  90 tablet    Refill:  5  . diclofenac (VOLTAREN) 75 MG EC tablet    Sig: Take 1 tablet (75 mg total) by mouth 2 (two) times daily with a meal.    Dispense:  60 tablet    Refill:  2

## 2019-02-15 NOTE — Patient Instructions (Addendum)
Start these meds   Meds ordered this encounter  Medications  . traMADol-acetaminophen (ULTRACET) 37.5-325 MG tablet    Sig: Take 1 tablet by mouth every 4 (four) hours as needed.    Dispense:  90 tablet    Refill:  5  . diclofenac (VOLTAREN) 75 MG EC tablet    Sig: Take 1 tablet (75 mg total) by mouth 2 (two) times daily with a meal.    Dispense:  60 tablet    Refill:  2

## 2019-02-15 NOTE — Telephone Encounter (Signed)
Crystal from [employer] called and said she wanted [place] to know that on any opiate prescription, the initial prescription can only be filled for 7 days or 42 pills.  She also said that any prescription for the same medication could be fill after this initial time however it is written by the physician  So, Mr. Bolsinger prescription for Tramadol was only filled for 7 days and 42 pills and not 90 days for 30 days as written on the prescription.

## 2019-03-12 ENCOUNTER — Inpatient Hospital Stay (HOSPITAL_COMMUNITY)
Admission: EM | Admit: 2019-03-12 | Discharge: 2019-03-15 | DRG: 246 | Disposition: A | Discharge status: Home / Self Care | Payer: Medicaid Other | Attending: Cardiovascular Disease | Admitting: Cardiovascular Disease

## 2019-03-12 ENCOUNTER — Encounter (HOSPITAL_COMMUNITY): Payer: Self-pay

## 2019-03-12 ENCOUNTER — Encounter (HOSPITAL_COMMUNITY)
Admission: EM | Disposition: A | Discharge status: Home / Self Care | Payer: Self-pay | Source: Home / Self Care | Attending: Interventional Cardiology

## 2019-03-12 ENCOUNTER — Emergency Department (HOSPITAL_COMMUNITY): Payer: Medicaid Other

## 2019-03-12 ENCOUNTER — Other Ambulatory Visit: Payer: Self-pay

## 2019-03-12 DIAGNOSIS — I313 Pericardial effusion (noninflammatory): Secondary | ICD-10-CM | POA: Diagnosis present

## 2019-03-12 DIAGNOSIS — F17210 Nicotine dependence, cigarettes, uncomplicated: Secondary | ICD-10-CM | POA: Diagnosis present

## 2019-03-12 DIAGNOSIS — Z7982 Long term (current) use of aspirin: Secondary | ICD-10-CM | POA: Diagnosis not present

## 2019-03-12 DIAGNOSIS — Z89422 Acquired absence of other left toe(s): Secondary | ICD-10-CM

## 2019-03-12 DIAGNOSIS — Z9049 Acquired absence of other specified parts of digestive tract: Secondary | ICD-10-CM | POA: Diagnosis not present

## 2019-03-12 DIAGNOSIS — Z809 Family history of malignant neoplasm, unspecified: Secondary | ICD-10-CM

## 2019-03-12 DIAGNOSIS — R079 Chest pain, unspecified: Secondary | ICD-10-CM | POA: Diagnosis present

## 2019-03-12 DIAGNOSIS — Z79891 Long term (current) use of opiate analgesic: Secondary | ICD-10-CM

## 2019-03-12 DIAGNOSIS — I130 Hypertensive heart and chronic kidney disease with heart failure and stage 1 through stage 4 chronic kidney disease, or unspecified chronic kidney disease: Secondary | ICD-10-CM | POA: Diagnosis present

## 2019-03-12 DIAGNOSIS — Z8249 Family history of ischemic heart disease and other diseases of the circulatory system: Secondary | ICD-10-CM | POA: Diagnosis not present

## 2019-03-12 DIAGNOSIS — Z79899 Other long term (current) drug therapy: Secondary | ICD-10-CM | POA: Diagnosis not present

## 2019-03-12 DIAGNOSIS — I213 ST elevation (STEMI) myocardial infarction of unspecified site: Secondary | ICD-10-CM

## 2019-03-12 DIAGNOSIS — E785 Hyperlipidemia, unspecified: Secondary | ICD-10-CM | POA: Diagnosis present

## 2019-03-12 DIAGNOSIS — N184 Chronic kidney disease, stage 4 (severe): Secondary | ICD-10-CM

## 2019-03-12 DIAGNOSIS — I5021 Acute systolic (congestive) heart failure: Secondary | ICD-10-CM | POA: Diagnosis present

## 2019-03-12 DIAGNOSIS — N179 Acute kidney failure, unspecified: Secondary | ICD-10-CM | POA: Diagnosis present

## 2019-03-12 DIAGNOSIS — I2510 Atherosclerotic heart disease of native coronary artery without angina pectoris: Secondary | ICD-10-CM | POA: Diagnosis present

## 2019-03-12 DIAGNOSIS — Z955 Presence of coronary angioplasty implant and graft: Secondary | ICD-10-CM

## 2019-03-12 DIAGNOSIS — Z20828 Contact with and (suspected) exposure to other viral communicable diseases: Secondary | ICD-10-CM | POA: Diagnosis present

## 2019-03-12 DIAGNOSIS — Z791 Long term (current) use of non-steroidal anti-inflammatories (NSAID): Secondary | ICD-10-CM

## 2019-03-12 DIAGNOSIS — I2102 ST elevation (STEMI) myocardial infarction involving left anterior descending coronary artery: Secondary | ICD-10-CM | POA: Diagnosis present

## 2019-03-12 DIAGNOSIS — Z87442 Personal history of urinary calculi: Secondary | ICD-10-CM | POA: Diagnosis not present

## 2019-03-12 DIAGNOSIS — R609 Edema, unspecified: Secondary | ICD-10-CM

## 2019-03-12 HISTORY — PX: CORONARY/GRAFT ACUTE MI REVASCULARIZATION: CATH118305

## 2019-03-12 HISTORY — DX: ST elevation (STEMI) myocardial infarction involving left anterior descending coronary artery: I21.02

## 2019-03-12 HISTORY — PX: LEFT HEART CATH AND CORONARY ANGIOGRAPHY: CATH118249

## 2019-03-12 LAB — BASIC METABOLIC PANEL
Anion gap: 11 (ref 5–15)
BUN: 30 mg/dL — ABNORMAL HIGH (ref 8–23)
CO2: 22 mmol/L (ref 22–32)
Calcium: 10.5 mg/dL — ABNORMAL HIGH (ref 8.9–10.3)
Chloride: 106 mmol/L (ref 98–111)
Creatinine, Ser: 3.4 mg/dL — ABNORMAL HIGH (ref 0.61–1.24)
GFR calc Af Amer: 21 mL/min — ABNORMAL LOW (ref >60)
GFR calc non Af Amer: 18 mL/min — ABNORMAL LOW (ref >60)
Glucose, Bld: 172 mg/dL — ABNORMAL HIGH (ref 70–99)
Potassium: 3.5 mmol/L (ref 3.5–5.1)
Sodium: 139 mmol/L (ref 135–145)

## 2019-03-12 LAB — MRSA PCR SCREENING: MRSA by PCR: NEGATIVE

## 2019-03-12 LAB — CBC
HCT: 44.4 % (ref 39.0–52.0)
HCT: 42.9 % (ref 39.0–52.0)
Hemoglobin: 14.6 g/dL (ref 13.0–17.0)
Hemoglobin: 14.3 g/dL (ref 13.0–17.0)
MCH: 30.6 pg (ref 26.0–34.0)
MCH: 30.8 pg (ref 26.0–34.0)
MCHC: 32.9 g/dL (ref 30.0–36.0)
MCHC: 33.3 g/dL (ref 30.0–36.0)
MCV: 93.1 fL (ref 80.0–100.0)
MCV: 92.3 fL (ref 80.0–100.0)
Platelets: 231 10*3/uL (ref 150–400)
Platelets: 232 10*3/uL (ref 150–400)
RBC: 4.77 MIL/uL (ref 4.22–5.81)
RBC: 4.65 MIL/uL (ref 4.22–5.81)
RDW: 14.6 % (ref 11.5–15.5)
RDW: 14.3 % (ref 11.5–15.5)
WBC: 8 10*3/uL (ref 4.0–10.5)
WBC: 10.4 10*3/uL (ref 4.0–10.5)
nRBC: 0 % (ref 0.0–0.2)
nRBC: 0 % (ref 0.0–0.2)

## 2019-03-12 LAB — LIPID PANEL
Cholesterol: 193 mg/dL (ref 0–200)
HDL: 42 mg/dL (ref >40)
LDL Cholesterol: 114 mg/dL — ABNORMAL HIGH (ref 0–99)
Total CHOL/HDL Ratio: 4.6 RATIO
Triglycerides: 184 mg/dL — ABNORMAL HIGH (ref <150)
VLDL: 37 mg/dL (ref 0–40)

## 2019-03-12 LAB — PLATELET COUNT: Platelets: 223 10*3/uL (ref 150–400)

## 2019-03-12 LAB — CREATININE, SERUM
Creatinine, Ser: 3.17 mg/dL — ABNORMAL HIGH (ref 0.61–1.24)
GFR calc Af Amer: 23 mL/min — ABNORMAL LOW (ref >60)
GFR calc non Af Amer: 20 mL/min — ABNORMAL LOW (ref >60)

## 2019-03-12 LAB — CBG MONITORING, ED: Glucose-Capillary: 168 mg/dL — ABNORMAL HIGH (ref 70–99)

## 2019-03-12 LAB — TROPONIN I (HIGH SENSITIVITY)
Troponin I (High Sensitivity): 1651 ng/L (ref <18)
Troponin I (High Sensitivity): >27000 ng/L (ref <18)
Troponin I (High Sensitivity): >27000 ng/L (ref <18)

## 2019-03-12 LAB — SARS CORONAVIRUS 2 BY RT PCR (HOSPITAL ORDER, PERFORMED IN ~~LOC~~ HOSPITAL LAB): SARS Coronavirus 2: NEGATIVE

## 2019-03-12 SURGERY — CORONARY/GRAFT ACUTE MI REVASCULARIZATION
Anesthesia: LOCAL

## 2019-03-12 MED ORDER — LIDOCAINE HCL (PF) 1 % IJ SOLN
INTRAMUSCULAR | Status: AC
  Filled 2019-03-12: qty 30

## 2019-03-12 MED ORDER — CARVEDILOL 3.125 MG PO TABS
3.1250 mg | ORAL_TABLET | Freq: Two times a day (BID) | ORAL | Status: DC
  Administered 2019-03-12: 3.125 mg via ORAL
  Filled 2019-03-12: qty 1

## 2019-03-12 MED ORDER — SODIUM CHLORIDE 0.9% FLUSH
3.0000 mL | Freq: Two times a day (BID) | INTRAVENOUS | Status: DC
  Administered 2019-03-13 – 2019-03-15 (×4): 3 mL via INTRAVENOUS

## 2019-03-12 MED ORDER — ISOSORB DINITRATE-HYDRALAZINE 20-37.5 MG PO TABS
1.0000 | ORAL_TABLET | Freq: Two times a day (BID) | ORAL | Status: DC
  Administered 2019-03-12 – 2019-03-15 (×7): 1 via ORAL
  Filled 2019-03-12 (×7): qty 1

## 2019-03-12 MED ORDER — HYDRALAZINE HCL 20 MG/ML IJ SOLN
10.0000 mg | INTRAMUSCULAR | Status: AC | PRN
  Administered 2019-03-12: 10 mg via INTRAVENOUS
  Filled 2019-03-12: qty 1

## 2019-03-12 MED ORDER — ISOSORB DINITRATE-HYDRALAZINE 20-37.5 MG PO TABS: 1.0000 | ORAL_TABLET | Freq: Two times a day (BID) | ORAL | Status: DC

## 2019-03-12 MED ORDER — NITROGLYCERIN IN D5W 200-5 MCG/ML-% IV SOLN
INTRAVENOUS | Status: AC
  Administered 2019-03-12: 50 mg via INTRAVENOUS
  Filled 2019-03-12: qty 250

## 2019-03-12 MED ORDER — HEPARIN SODIUM (PORCINE) 1000 UNIT/ML IJ SOLN
INTRAMUSCULAR | Status: AC
  Filled 2019-03-12: qty 1

## 2019-03-12 MED ORDER — NITROGLYCERIN 1 MG/10 ML FOR IR/CATH LAB
INTRA_ARTERIAL | Status: AC
  Filled 2019-03-12: qty 10

## 2019-03-12 MED ORDER — HEPARIN (PORCINE) IN NACL 1000-0.9 UT/500ML-% IV SOLN
INTRAVENOUS | Status: AC
  Filled 2019-03-12: qty 1000

## 2019-03-12 MED ORDER — CARVEDILOL 6.25 MG PO TABS
6.2500 mg | ORAL_TABLET | Freq: Once | ORAL | Status: AC
  Administered 2019-03-12: 6.25 mg via ORAL
  Filled 2019-03-12: qty 1

## 2019-03-12 MED ORDER — VERAPAMIL HCL 2.5 MG/ML IV SOLN
INTRAVENOUS | Status: AC
  Filled 2019-03-12: qty 2

## 2019-03-12 MED ORDER — HEPARIN (PORCINE) 25000 UT/250ML-% IV SOLN
INTRAVENOUS | Status: AC
  Administered 2019-03-12: 09:00:00 via INTRAVENOUS
  Filled 2019-03-12: qty 250

## 2019-03-12 MED ORDER — NITROGLYCERIN IN D5W 200-5 MCG/ML-% IV SOLN
10.0000 ug/min | INTRAVENOUS | Status: DC
  Administered 2019-03-12: 40 ug/min via INTRAVENOUS
  Administered 2019-03-12: 09:00:00 50 mg via INTRAVENOUS
  Filled 2019-03-12: qty 250

## 2019-03-12 MED ORDER — ACETAMINOPHEN 325 MG PO TABS: 650.0000 mg | ORAL_TABLET | ORAL | Status: DC | PRN

## 2019-03-12 MED ORDER — TICAGRELOR 90 MG PO TABS: 90.0000 mg | ORAL_TABLET | Freq: Two times a day (BID) | ORAL | Status: DC

## 2019-03-12 MED ORDER — ASPIRIN 81 MG PO CHEW
324.0000 mg | CHEWABLE_TABLET | Freq: Once | ORAL | Status: AC
  Administered 2019-03-12: 324 mg via ORAL
  Filled 2019-03-12: qty 4

## 2019-03-12 MED ORDER — NITROGLYCERIN 1 MG/10 ML FOR IR/CATH LAB
INTRA_ARTERIAL | Status: DC | PRN
  Administered 2019-03-12 (×2): 200 ug via INTRACORONARY

## 2019-03-12 MED ORDER — CARVEDILOL 3.125 MG PO TABS: 3.1250 mg | ORAL_TABLET | Freq: Once | ORAL | Status: DC

## 2019-03-12 MED ORDER — CARVEDILOL 3.125 MG PO TABS: 3.1250 mg | ORAL_TABLET | Freq: Two times a day (BID) | ORAL | Status: DC

## 2019-03-12 MED ORDER — HEPARIN SODIUM (PORCINE) 5000 UNIT/ML IJ SOLN
5000.0000 [IU] | Freq: Three times a day (TID) | INTRAMUSCULAR | Status: DC
  Administered 2019-03-12 – 2019-03-15 (×8): 5000 [IU] via SUBCUTANEOUS
  Filled 2019-03-12 (×8): qty 1

## 2019-03-12 MED ORDER — ONDANSETRON HCL 4 MG/2ML IJ SOLN
4.0000 mg | Freq: Four times a day (QID) | INTRAMUSCULAR | Status: DC | PRN
  Administered 2019-03-12 (×2): 4 mg via INTRAVENOUS
  Filled 2019-03-12 (×2): qty 2

## 2019-03-12 MED ORDER — ASPIRIN 81 MG PO CHEW
81.0000 mg | CHEWABLE_TABLET | Freq: Every day | ORAL | Status: DC
  Administered 2019-03-13 – 2019-03-15 (×3): 81 mg via ORAL
  Filled 2019-03-12 (×4): qty 1

## 2019-03-12 MED ORDER — TIROFIBAN (AGGRASTAT) BOLUS VIA INFUSION
INTRAVENOUS | Status: DC | PRN
  Administered 2019-03-12: 2280 ug via INTRAVENOUS

## 2019-03-12 MED ORDER — TICAGRELOR 90 MG PO TABS
ORAL_TABLET | ORAL | Status: AC
  Filled 2019-03-12: qty 2

## 2019-03-12 MED ORDER — LIDOCAINE HCL (PF) 1 % IJ SOLN
INTRAMUSCULAR | Status: DC | PRN
  Administered 2019-03-12: 2 mL via INTRADERMAL

## 2019-03-12 MED ORDER — TIROFIBAN HCL IV 12.5 MG/250 ML
0.0750 ug/kg/min | INTRAVENOUS | Status: AC
  Administered 2019-03-12 (×3): 0.075 ug/kg/min via INTRAVENOUS
  Filled 2019-03-12: qty 250

## 2019-03-12 MED ORDER — MORPHINE SULFATE (PF) 4 MG/ML IV SOLN
4.0000 mg | Freq: Once | INTRAVENOUS | Status: AC
  Administered 2019-03-12: 4 mg via INTRAVENOUS

## 2019-03-12 MED ORDER — SODIUM CHLORIDE 0.9% FLUSH: 3.0000 mL | INTRAVENOUS | Status: DC | PRN

## 2019-03-12 MED ORDER — TICAGRELOR 90 MG PO TABS
90.0000 mg | ORAL_TABLET | Freq: Two times a day (BID) | ORAL | Status: DC
  Administered 2019-03-12 – 2019-03-15 (×6): 90 mg via ORAL
  Filled 2019-03-12 (×6): qty 1

## 2019-03-12 MED ORDER — HEPARIN (PORCINE) IN NACL 1000-0.9 UT/500ML-% IV SOLN
INTRAVENOUS | Status: DC | PRN
  Administered 2019-03-12 (×4): 500 mL

## 2019-03-12 MED ORDER — SODIUM CHLORIDE 0.9 % IV SOLN
INTRAVENOUS | Status: AC
  Administered 2019-03-12: 11:00:00 via INTRAVENOUS

## 2019-03-12 MED ORDER — AMLODIPINE BESYLATE 5 MG PO TABS
5.0000 mg | ORAL_TABLET | Freq: Every day | ORAL | Status: DC
  Administered 2019-03-12 – 2019-03-14 (×3): 5 mg via ORAL
  Filled 2019-03-12 (×3): qty 1

## 2019-03-12 MED ORDER — VERAPAMIL HCL 2.5 MG/ML IV SOLN
INTRAVENOUS | Status: DC | PRN
  Administered 2019-03-12: 10 mL via INTRA_ARTERIAL

## 2019-03-12 MED ORDER — MORPHINE SULFATE (PF) 4 MG/ML IV SOLN
INTRAVENOUS | Status: AC
  Filled 2019-03-12: qty 1

## 2019-03-12 MED ORDER — OXYCODONE HCL 5 MG PO TABS
5.0000 mg | ORAL_TABLET | ORAL | Status: DC | PRN
  Administered 2019-03-12: 5 mg via ORAL
  Filled 2019-03-12: qty 1

## 2019-03-12 MED ORDER — CHLORHEXIDINE GLUCONATE CLOTH 2 % EX PADS
6.0000 | MEDICATED_PAD | Freq: Every day | CUTANEOUS | Status: DC
  Administered 2019-03-12 – 2019-03-15 (×3): 6 via TOPICAL

## 2019-03-12 MED ORDER — TIROFIBAN HCL IN NACL 5-0.9 MG/100ML-% IV SOLN
INTRAVENOUS | Status: AC
  Filled 2019-03-12: qty 100

## 2019-03-12 MED ORDER — SODIUM CHLORIDE 0.9% FLUSH: 3.0000 mL | Freq: Once | INTRAVENOUS | Status: DC

## 2019-03-12 MED ORDER — HEPARIN SODIUM (PORCINE) 5000 UNIT/ML IJ SOLN
4000.0000 [IU] | Freq: Once | INTRAMUSCULAR | Status: DC
  Filled 2019-03-12: qty 1

## 2019-03-12 MED ORDER — HEPARIN SODIUM (PORCINE) 1000 UNIT/ML IJ SOLN
INTRAMUSCULAR | Status: DC | PRN
  Administered 2019-03-12: 2000 [IU] via INTRAVENOUS
  Administered 2019-03-12: 10000 [IU] via INTRAVENOUS

## 2019-03-12 MED ORDER — IOHEXOL 350 MG/ML SOLN
INTRAVENOUS | Status: DC | PRN
  Administered 2019-03-12: 95 mL

## 2019-03-12 MED ORDER — SODIUM CHLORIDE 0.9 % IV SOLN: 250.0000 mL | INTRAVENOUS | Status: DC | PRN

## 2019-03-12 MED ORDER — TIROFIBAN HCL IN NACL 5-0.9 MG/100ML-% IV SOLN
INTRAVENOUS | Status: AC | PRN
  Administered 2019-03-12: 0.075 ug/kg/min via INTRAVENOUS

## 2019-03-12 MED ORDER — HEPARIN (PORCINE) 25000 UT/250ML-% IV SOLN
1000.0000 [IU]/h | INTRAVENOUS | Status: DC
  Administered 2019-03-12: 09:00:00 via INTRAVENOUS

## 2019-03-12 MED ORDER — NITROGLYCERIN IN D5W 200-5 MCG/ML-% IV SOLN
INTRAVENOUS | Status: AC | PRN
  Administered 2019-03-12: 10 ug/min via INTRAVENOUS

## 2019-03-12 MED ORDER — CARVEDILOL 6.25 MG PO TABS
6.2500 mg | ORAL_TABLET | Freq: Two times a day (BID) | ORAL | Status: DC
  Administered 2019-03-13: 6.25 mg via ORAL
  Filled 2019-03-12: qty 1

## 2019-03-12 MED ORDER — FUROSEMIDE 10 MG/ML IJ SOLN
40.0000 mg | Freq: Once | INTRAMUSCULAR | Status: AC
  Administered 2019-03-12: 40 mg via INTRAVENOUS
  Filled 2019-03-12: qty 4

## 2019-03-12 MED ORDER — SODIUM CHLORIDE 0.9 % IV SOLN
INTRAVENOUS | Status: DC
  Administered 2019-03-12: 11:00:00 via INTRAVENOUS

## 2019-03-12 MED ORDER — TICAGRELOR 90 MG PO TABS
ORAL_TABLET | ORAL | Status: DC | PRN
  Administered 2019-03-12: 180 mg via ORAL

## 2019-03-12 SURGICAL SUPPLY — 15 items
BALLN EMERGE MR 3.5X15 (BALLOONS) ×2
BALLOON EMERGE MR 3.5X15 (BALLOONS) IMPLANT
CATH 5FR JL3.5 JR4 ANG PIG MP (CATHETERS) ×1 IMPLANT
CATH VISTA GUIDE 6FR XBLAD3.5 (CATHETERS) ×1 IMPLANT
DEVICE RAD COMP TR BAND LRG (VASCULAR PRODUCTS) ×1 IMPLANT
GLIDESHEATH SLEND A-KIT 6F 22G (SHEATH) ×1 IMPLANT
GUIDEWIRE INQWIRE 1.5J.035X260 (WIRE) IMPLANT
INQWIRE 1.5J .035X260CM (WIRE) ×2
KIT ENCORE 26 ADVANTAGE (KITS) ×2 IMPLANT
KIT HEART LEFT (KITS) ×2 IMPLANT
PACK CARDIAC CATHETERIZATION (CUSTOM PROCEDURE TRAY) ×2 IMPLANT
STENT RESOLUTE ONYX 4.0X22 (Permanent Stent) ×1 IMPLANT
TRANSDUCER W/STOPCOCK (MISCELLANEOUS) ×2 IMPLANT
TUBING CIL FLEX 10 FLL-RA (TUBING) ×2 IMPLANT
WIRE ASAHI PROWATER 180CM (WIRE) ×1 IMPLANT

## 2019-03-12 NOTE — ED Notes (Signed)
Spoke with Ruby at [facility] will page STEMI MD to EDP.

## 2019-03-12 NOTE — CV Procedure (Signed)
   Acute ST elevation anterior and inferior myocardial infarction  Right radial access via real-time vascular ultrasound.  Widely patent right coronary  Widely patent left main and circumflex.  Circumflex has distal disease.  Total occlusion mid LAD reduced to 0% with TIMI grade III flow using 4.0 x 22 Onyx deployed at 14 atm x 2.  Apical LAD embolus--> obstruction.  IV Aggrastat x18 hours  LVEF 30 to 35%.  With EDP 36.  Consider IV Lasix and low flow IV fluids.  High risk for acute CHF/hypoxia.

## 2019-03-12 NOTE — Progress Notes (Signed)
Called for peripheral IV access. Midline not placed due to CRCL 24.7  PIV placed Left forearm

## 2019-03-12 NOTE — ED Triage Notes (Signed)
Pt reports woke up at 4 am with severe chest pain radiating to left arm.  Denies any cough, fever, sob, n/v.

## 2019-03-12 NOTE — Progress Notes (Addendum)
VAST consulted to place IV access. Current access infiltrated with Nitroglycerin. Spoke with pharmacy; no anecdote noted for nitroglycerin infiltration. Right arm restricted d/t infiltration. Assessed LA with [place]. Vessels visualized too small for 20g or appropriate only for midline or PICC. Pt verbalized he should only be here for a couple of days.

## 2019-03-12 NOTE — H&P (Signed)
Cardiology Admission History and Physical:   Patient ID: [name] MRN: [id number]; DOB: November 07, 1955   Admission date: 03/12/2019  Primary Care Provider: Doree Albee, MD Primary Cardiologist: No primary care provider on file. NEW Primary Electrophysiologist:  None   Chief Complaint:  Chest pain  Patient Profile:   [name] is a 63 y.o. male with with presumed hypertensive kidney disease (most recent creatinine 2.8 and 2018) who presented to [facility] at 8 AM complaint of chest pain since 4 AM.  History of Present Illness:   [name] there is not much prior history on the patient other than a longstanding history of hypertension and upon review of chart significant CKD with stage IV GFR.  He began having chest pain at 4 AM.  EKG demonstrates dramatic ST elevation in nearly every lead starting in V1 and to V6 as well as inferior leads.  EKG suggests ongoing acute injury in both anterior and inferior leads.  He is a cigarette smoker.  He has not had his blood pressure medicines in over 3 months.  He has an upcoming appointment with a new primary care physician on November 17.  He is here with him.  He states that his brother is on the way.  Cardiac risk factors include CKD, hyperlipidemia, hypertension, and tobacco use.  Heart Pathway Score:     Past Medical History:  Diagnosis Date  . Hypertension 2002  . Kidney stones   . Left knee pain     Past Surgical History:  Procedure Laterality Date  . AMPUTATION TOE Left 1991   2 digit of left foot  . CHOLECYSTECTOMY    . KIDNEY STONE SURGERY Right      Medications Prior to Admission: Prior to Admission medications   Medication Sig Start Date End Date Taking? Authorizing Provider  aspirin-acetaminophen-caffeine (EXCEDRIN EXTRA STRENGTH) [phone] MG tablet Take 2 tablets by mouth daily as needed for headache.    [provider]  diclofenac (VOLTAREN) 75 MG EC tablet Take 1 tablet (75 mg  total) by mouth 2 (two) times daily with a meal. 02/15/19   Carole Civil, MD  lisinopril (ZESTRIL) 20 MG tablet Take 20 mg by mouth daily.    [provider]  lovastatin (MEVACOR) 20 MG tablet Take 20 mg by mouth at bedtime.    [provider]  meloxicam (MOBIC) 7.5 MG tablet Take 1 tablet (7.5 mg total) by mouth daily. 02/08/19   Carole Civil, MD  oxyCODONE-acetaminophen (PERCOCET/ROXICET) 5-325 MG tablet Take 1 tablet by mouth every 4 (four) hours as needed. 05/30/18   Evalee Jefferson, PA-C  traMADol-acetaminophen (ULTRACET) 37.5-325 MG tablet Take 1 tablet by mouth every 4 (four) hours as needed. 02/15/19   Carole Civil, MD     Allergies:   No Known Allergies  Social History:   Social History   Socioeconomic History  . Marital status: Single    Spouse name: Not on file  . Number of children: Not on file  . Years of education: Not on file  . Highest education level: Not on file  Occupational History  . Not on file  Social Needs  . Financial resource strain: Not on file  . Food insecurity    Worry: Not on file    Inability: Not on file  . Transportation needs    Medical: Not on file    Non-medical: Not on file  Tobacco Use  . Smoking status: Current Some Day Smoker  Packs/day: 0.50    Years: 40.00    Pack years: 20.00    Types: Cigarettes    Last attempt to quit: 05/25/2016    Years since quitting: 2.7  . Smokeless tobacco: Never Used  Substance and Sexual Activity  . Alcohol use: No  . Drug use: No  . Sexual activity: Not on file  Lifestyle  . Physical activity    Days per week: Not on file    Minutes per session: Not on file  . Stress: Not on file  Relationships  . Social Herbalist on phone: Not on file    Gets together: Not on file    Attends religious service: Not on file    Active member of club or organization: Not on file    Attends meetings of clubs or organizations: Not on file    Relationship status: Not on  file  . Intimate partner violence    Fear of current or ex partner: Not on file    Emotionally abused: Not on file    Physically abused: Not on file    Forced sexual activity: Not on file  Other Topics Concern  . Not on file  Social History Narrative  . Not on file    Family History:   The patient's family history includes Cancer in his mother; Heart attack in his father; Heart disease in his father and mother; Heart failure in his mother; Hypertension in his father.    ROS:  Please see the history of present illness.  Poor health care follow-up.  With arthritis in his knees.  Has not seen a physician in over 2 years.  No antihypertensive therapy recently.  Smokes cigarettes.  All other ROS reviewed and negative.     Physical Exam/Data:   Vitals:   03/12/19 0830 03/12/19 0844 03/12/19 0905  BP:  (!) 190/141 (!) 162/132  Pulse:  (!) 106 (!) 116  Resp:  (!) 28 20  Temp:  97.7 F (36.5 C)   TempSrc:  Oral   SpO2:  96% 98%  Weight: 91.2 kg    Height: 6' (1.829 m)     No intake or output data in the 24 hours ending 03/12/19 0949 Last 3 Weights 03/12/2019 02/15/2019 02/08/2019  Weight (lbs) 201 lb 194 lb 200 lb 6.4 oz  Weight (kg) 91.173 kg 87.998 kg 90.901 kg     Body mass index is 27.26 kg/m.  General:  Well nourished, well developed, in no acute distress.  He is able to lay flat. HEENT: normal Lymph: no adenopathy Neck: no JVD Endocrine:  No thryomegaly Vascular: No carotid bruits; FA pulses 2+ bilaterally without bruits  Cardiac:  normal S1, S2; RRR; there is an S4 gallop.  No murmur is heard. Lungs:  clear to auscultation bilaterally, no wheezing, rhonchi or rales  Abd: soft, nontender, no hepatomegaly  Ext: no edema Musculoskeletal:  No deformities, BUE and BLE strength normal and equal Skin: warm and dry  Neuro:  CNs 2-12 intact, no focal abnormalities noted Psych:  Normal affect    EKG:  The ECG that was done 03/12/2019 was personally reviewed and demonstrates  normal sinus rhythm with marked ST elevation V1 through V4 and into 3 and aVF.  Pattern in conjunction with clinical status suggests a wraparound LAD with inferior extension.  Relevant CV Studies: None  Laboratory Data:  High Sensitivity Troponin:  No results for input(s): TROPONINIHS in the last 720 hours.    ChemistryNo results  for input(s): NA, K, CL, CO2, GLUCOSE, BUN, CREATININE, CALCIUM, GFRNONAA, GFRAA, ANIONGAP in the last 168 hours.  No results for input(s): PROT, ALBUMIN, AST, ALT, ALKPHOS, BILITOT in the last 168 hours. Hematology Recent Labs  Lab 03/12/19 0833  WBC 8.0  RBC 4.77  HGB 14.6  HCT 44.4  MCV 93.1  MCH 30.6  MCHC 32.9  RDW 14.6  PLT 231   BNPNo results for input(s): BNP, PROBNP in the last 168 hours.  DDimer No results for input(s): DDIMER in the last 168 hours.   Radiology/Studies:  Dg Chest Portable 1 View  Result Date: 03/12/2019 CLINICAL DATA:  Chest pain. EXAM: PORTABLE CHEST 1 VIEW COMPARISON:  Chest radiograph [id number] FINDINGS: Monitoring leads overlie the patient. Stable cardiac and mediastinal contours. Pulmonary vascular redistribution and mild interstitial opacities. No pleural effusion or pneumothorax. IMPRESSION: Bilateral interstitial opacities may represent mild edema or atypical infectious process. Electronically Signed   By: Lovey Newcomer M.D.   On: 03/12/2019 09:13    Assessment and Plan:   1. Acute anterior and inferior ST elevation myocardial infarction --> all set of symptoms for a.m. and unrelenting. 2. CKD stage IV , no details on etiology.  Suspect hypertensive in nature.  We will get a hemoglobin A1c to rule out diabetes. 3. Severe, untreated essential hypertension: Not currently on therapy.  Hypertension is longstanding.  2D Doppler echocardiogram will be obtained to identify the absence of presence of LVH. 4. Tobacco abuse: We will discuss cessation. 5. Acute systolic heart failure: LVEF 35% or less.  LVEDP 31 mmHg.  Will gently  hydrate because of severe CKD and contrast exposure (90 cc).  If he develops dyspnea, IV Lasix 40 mg should be given immediately.  For the time being will use IV nitroglycerin as preload reducing agent to help decrease the possibility of pulmonary congestion. 6. Hyperlipidemia with LDL of 114 on Mevacor although I do not believe he is taking the medication.  RECOMMENDATIONS:  1. Status post drug-eluting stent LAD.  Needs 1 year dual antiplatelet therapy with aspirin and Brilinta.  Also high intensity statin therapy will be started. 2. Gentle IV hydration for 4 hours, watch for heart failure signs and symptoms.  IV Lasix if needed.  Need to get nephrology service on board. 3. Blood pressure control will be with beta-blocker therapy, BiDil, and possibly diuretic therapy if needed. 4. Encourage cigarette smoking cessation. 5. Unable to use ACE or ARB therapy.  BiDil will be used instead.  Beta-blocker therapy will be instituted tomorrow. 6. High intensity statin therapy.  Severity of Illness: The appropriate patient status for this patient is INPATIENT. Inpatient status is judged to be reasonable and necessary in order to provide the required intensity of service to ensure the patient's safety. The patient's presenting symptoms, physical exam findings, and initial radiographic and laboratory data in the context of their chronic comorbidities is felt to place them at high risk for further clinical deterioration. Furthermore, it is not anticipated that the patient will be medically stable for discharge from the hospital within 2 midnights of admission. The following factors support the patient status of inpatient.   " The patient's presenting symptoms include acute MI with chest pain. " The worrisome physical exam findings include none. " The initial radiographic and laboratory data are worrisome because of ejection fraction less than 35%. " The chronic co-morbidities include stage IV CKD.   * I  certify that at the point of admission it is my clinical judgment that  the patient will require inpatient hospital care spanning beyond 2 midnights from the point of admission due to high intensity of service, high risk for further deterioration and high frequency of surveillance required.*    For questions or updates, please contact [place] Please consult [email or web address] for contact info under   Critical CARE time: 45 minutes    Signed, Sinclair Grooms, MD  03/12/2019 9:49 AM

## 2019-03-12 NOTE — ED Notes (Signed)
[facility] called to transport pt to [facility].

## 2019-03-12 NOTE — Progress Notes (Signed)
   03/12/19 0939  Clinical Encounter Type  Visited With Health care provider;Patient not available  Visit Type Initial;ED  Referral From Nurse   Chaplain responded to a code STEMI in the ED. Per the ED Bridge, patient has not arrived yet. Spiritual care services available as needed.   Jeri Lager, Chaplain

## 2019-03-12 NOTE — ED Provider Notes (Signed)
Emergency Department Provider Note   I have reviewed the triage vital signs and the nursing notes.   HISTORY  Chief Complaint Chest Pain   HPI [name] is a 63 y.o. male with PMH of HTN and HLD presents to the emergency department by private vehicle with acute onset chest pain and diaphoresis which began at 4 AM.  Patient describes the pain as pressure underneath his left breast and radiating to the left arm.  Pain is somewhat improved since onset but continues to have significant, constant discomfort.  No other modifying factors.  No similar pain in the past.  Patient denies history of ACS or CVA.  Patient states he last ate sometime early in the morning but cannot be sure exactly when.  He is not anticoagulated. Denies any back or abdomen pain.   Past Medical History:  Diagnosis Date  . Hypertension 2002  . Kidney stones   . Left knee pain     Patient Active Problem List   Diagnosis Date Noted  . Acute ST elevation myocardial infarction (STEMI) due to occlusion of mid portion of left anterior descending (LAD) coronary artery ([place]) 03/12/2019  . ST elevation myocardial infarction (STEMI) ([place])   . Acute systolic heart failure ([place])   . Chronic kidney disease 06/11/2016  . Elevated PSA 06/11/2016  . Hyperlipidemia 06/11/2016  . Essential hypertension 05/28/2016    Past Surgical History:  Procedure Laterality Date  . AMPUTATION TOE Left 1991   2 digit of left foot  . CHOLECYSTECTOMY    . KIDNEY STONE SURGERY Right     Allergies Patient has no known allergies.  Family History  Problem Relation Age of Onset  . Heart failure Mother   . Heart disease Mother   . Cancer Mother        breast  . Heart attack Father   . Hypertension Father   . Heart disease Father     Social History Social History   Tobacco Use  . Smoking status: Current Some Day Smoker    Packs/day: 0.50    Years: 40.00    Pack years: 20.00    Types: Cigarettes    Last attempt to  quit: 05/25/2016    Years since quitting: 2.7  . Smokeless tobacco: Never Used  Substance Use Topics  . Alcohol use: No  . Drug use: No    Review of Systems  Constitutional: No fever/chills Eyes: No visual changes. ENT: No sore throat. Cardiovascular: Positive chest pain and diaphoresis.  Respiratory: Denies shortness of breath. Gastrointestinal: No abdominal pain.  No nausea, no vomiting.  No diarrhea.  No constipation. Genitourinary: Negative for dysuria. Musculoskeletal: Negative for back pain. Skin: Negative for rash. Neurological: Negative for headaches, focal weakness or numbness.  10-point ROS otherwise negative.  ____________________________________________   PHYSICAL EXAM:  VITAL SIGNS: ED Triage Vitals  Enc Vitals Group     BP 03/12/19 0844 (!) 190/141     Pulse Rate 03/12/19 0844 (!) 106     Resp 03/12/19 0844 (!) 28     Temp 03/12/19 0844 97.7 F (36.5 C)     Temp Source 03/12/19 0844 Oral     SpO2 03/12/19 0844 96 %     Weight 03/12/19 0830 201 lb (91.2 kg)     Height 03/12/19 0830 6' (1.829 m)   Constitutional: Alert and oriented. Well appearing and in no acute distress. Eyes: Conjunctivae are normal.  Head: Atraumatic. Nose: No congestion/rhinnorhea. Mouth/Throat: Mucous membranes are moist.  Neck: No stridor.   Cardiovascular: Tachycardia. Good peripheral circulation. Grossly normal heart sounds.   Respiratory: Normal respiratory effort.  No retractions. Lungs CTAB. Gastrointestinal: Soft and nontender. No distention.  Musculoskeletal: No lower extremity tenderness nor edema. No gross deformities of extremities. Neurologic:  Normal speech and language. No gross focal neurologic deficits are appreciated.  Skin:  Skin is warm, dry and intact. No rash noted.  ____________________________________________   LABS (all labs ordered are listed, but only abnormal results are displayed)  Labs Reviewed  BASIC METABOLIC PANEL - Abnormal; Notable for the  following components:      Result Value   Glucose, Bld 172 (*)    BUN 30 (*)    Creatinine, Ser 3.40 (*)    Calcium 10.5 (*)    GFR calc non Af Amer 18 (*)    GFR calc Af Amer 21 (*)    All other components within normal limits  LIPID PANEL - Abnormal; Notable for the following components:   Triglycerides 184 (*)    LDL Cholesterol 114 (*)    All other components within normal limits  CREATININE, SERUM - Abnormal; Notable for the following components:   Creatinine, Ser 3.17 (*)    GFR calc non Af Amer 20 (*)    GFR calc Af Amer 23 (*)    All other components within normal limits  CBG MONITORING, ED - Abnormal; Notable for the following components:   Glucose-Capillary 168 (*)    All other components within normal limits  TROPONIN I (HIGH SENSITIVITY) - Abnormal; Notable for the following components:   Troponin I (High Sensitivity) 1,651 (*)    All other components within normal limits  TROPONIN I (HIGH SENSITIVITY) - Abnormal; Notable for the following components:   Troponin I (High Sensitivity) >27,000 (*)    All other components within normal limits  TROPONIN I (HIGH SENSITIVITY) - Abnormal; Notable for the following components:   Troponin I (High Sensitivity) >27,000 (*)    All other components within normal limits  SARS CORONAVIRUS 2 (HOSPITAL ORDER, [facility])  MRSA PCR SCREENING  CBC  CBC  BASIC METABOLIC PANEL  CBC  HEMOGLOBIN A1C   ____________________________________________  EKG   EKG Interpretation  Date/Time:  Sunday March 12 2019 08:39:23 EST Ventricular Rate:  98 PR Interval:    QRS Duration: 100 QT Interval:  370 QTC Calculation: 473 R Axis:   14 Text Interpretation: Sinus rhythm Probable left atrial enlargement Inferior infarct, acute Extensive anterior infarct, acute (LAD) >>> Acute MI <<< ** ** ACUTE MI / non-STEMI ** ** Confirmed by Nanda Quinton [phone]) on 03/12/2019 8:41:44 AM        ____________________________________________  RADIOLOGY  Dg Chest Portable 1 View  Result Date: 03/12/2019 CLINICAL DATA:  Chest pain. EXAM: PORTABLE CHEST 1 VIEW COMPARISON:  Chest radiograph [id number] FINDINGS: Monitoring leads overlie the patient. Stable cardiac and mediastinal contours. Pulmonary vascular redistribution and mild interstitial opacities. No pleural effusion or pneumothorax. IMPRESSION: Bilateral interstitial opacities may represent mild edema or atypical infectious process. Electronically Signed   By: Lovey Newcomer M.D.   On: 03/12/2019 09:13    ____________________________________________   PROCEDURES  Procedure(s) performed:   Procedures  CRITICAL CARE Performed by: Margette Fast Total critical care time: 35 minutes Critical care time was exclusive of separately billable procedures and treating other patients. Critical care was necessary to treat or prevent imminent or life-threatening deterioration. Critical care was time spent personally by me on  the following activities: development of treatment plan with patient and/or surrogate as well as nursing, discussions with consultants, evaluation of patient's response to treatment, examination of patient, obtaining history from patient or surrogate, ordering and performing treatments and interventions, ordering and review of laboratory studies, ordering and review of radiographic studies, pulse oximetry and re-evaluation of patient's condition.  Nanda Quinton, MD Emergency Medicine  ____________________________________________   INITIAL IMPRESSION / ASSESSMENT AND PLAN / ED COURSE  Pertinent labs & imaging results that were available during my care of the patient were reviewed by me and considered in my medical decision making (see chart for details).   Patient arrives by private vehicle to the emergency department with chest pain.  EKG on arrival shows STEMI.  Patient continues to have active chest pain.  Activated code  STEMI and ordered heparin along with nitroglycerin infusion.   08:45 AM  Spoke with [name] with Cardiology. Will accept patient to [facility] as STEM[facility]. No [facility] truck available. Will call local EMS for emergency transport.   08:59 AM  Spoke with AC about sending rapid COVID. He will place order.   09:05 AM  Local EMS at bedside for transport to [facility].   09:22 AM  CXR resulted and reviewed. COVID pending but suspect edema rather that atypical infection as patient is without symptoms. In transport to [facility] now.  ____________________________________________  FINAL CLINICAL IMPRESSION(S) / ED DIAGNOSES  Final diagnoses:  ST elevation myocardial infarction (STEMI), unspecified artery ([place])    MEDICATIONS GIVEN DURING THIS VISIT:  Medications  0.9 %  sodium chloride infusion ( Intravenous Stopped 03/12/19 1558)  morphine 4 MG/ML injection (has no administration in time range)  hydrALAZINE (APRESOLINE) injection 10 mg (10 mg Intravenous Given 03/12/19 1124)  acetaminophen (TYLENOL) tablet 650 mg (has no administration in time range)  ondansetron (ZOFRAN) injection 4 mg (4 mg Intravenous Given 03/12/19 1220)  heparin injection 5,000 Units (has no administration in time range)  0.9 %  sodium chloride infusion ( Intravenous Stopped 03/12/19 1448)  sodium chloride flush (NS) 0.9 % injection 3 mL (3 mLs Intravenous Not Given 03/12/19 1634)  sodium chloride flush (NS) 0.9 % injection 3 mL (has no administration in time range)  0.9 %  sodium chloride infusion (has no administration in time range)  oxyCODONE (Oxy IR/ROXICODONE) immediate release tablet 5-10 mg (5 mg Oral Given 03/12/19 1630)  nitroGLYCERIN 50 mg in dextrose 5 % 250 mL (0.2 mg/mL) infusion (0 mcg/min Intravenous Stopped 03/12/19 1558)  aspirin chewable tablet 81 mg (81 mg Oral Not Given 03/12/19 1341)  Chlorhexidine Gluconate Cloth 2 % PADS 6 each (6 each Topical Given 03/12/19 1122)  tirofiban (AGGRASTAT) infusion 50 mcg/mL 250 mL (0  mcg/kg/min  91.2 kg Intravenous Stopped 03/12/19 1558)  amLODipine (NORVASC) tablet 5 mg (5 mg Oral Given 03/12/19 1341)  ticagrelor (BRILINTA) tablet 90 mg (has no administration in time range)  carvedilol (COREG) tablet 3.125 mg (3.125 mg Oral Given 03/12/19 1625)  isosorbide-hydrALAZINE (BIDIL) 20-37.5 MG per tablet 1 tablet (1 tablet Oral Given 03/12/19 1625)  aspirin chewable tablet 324 mg (324 mg Oral Given 03/12/19 0858)  morphine 4 MG/ML injection 4 mg (4 mg Intravenous Given 03/12/19 0926)  nitroGLYCERIN 50 mg in dextrose 5 % 250 mL (0.2 mg/mL) infusion (  Stopped 03/12/19 1121)  tirofiban (AGGRASTAT) infusion 50 mcg/mL 100 mL (  Stopped 03/12/19 1121)  furosemide (LASIX) injection 40 mg (40 mg Intravenous Given 03/12/19 1455)  verapamil (ISOPTIN) 2.5 MG/ML injection (has no  administration in time range)  heparin 1000 UNIT/ML injection (has no administration in time range)  nitroGLYCERIN 100 mcg/mL intra-arterial injection (has no administration in time range)  lidocaine (PF) (XYLOCAINE) 1 % injection (has no administration in time range)  Heparin (Porcine) in NaCl 1000-0.9 UT/500ML-% SOLN (has no administration in time range)  ticagrelor (BRILINTA) 90 MG tablet (has no administration in time range)  tirofiban (AGGRASTAT) 5-0.9 MG/100ML-% injection (has no administration in time range)  Heparin (Porcine) in NaCl 1000-0.9 UT/500ML-% SOLN (has no administration in time range)  heparin 1000 UNIT/ML injection (has no administration in time range)  verapamil (ISOPTIN) 2.5 MG/ML injection (has no administration in time range)  heparin 1000 UNIT/ML injection (has no administration in time range)  lidocaine (PF) (XYLOCAINE) 1 % injection (has no administration in time range)  Heparin (Porcine) in NaCl 1000-0.9 UT/500ML-% SOLN (has no administration in time range)    Note:  This document was prepared using Dragon voice recognition software and may include unintentional dictation errors.  Nanda Quinton, MD, [facility] Emergency Medicine    , [place], MD 03/12/19 [phone]

## 2019-03-12 NOTE — ED Notes (Signed)
[employer]

## 2019-03-12 NOTE — Progress Notes (Signed)
Cardiology MD notified about pt's Coreg order and and clarified that pt is to recieve evening dose of 6.25 vs 3.125 given pt's HTN and tachycardia. MD came to bedside and was made aware of pt's TR band site (level 1), notably edematous and tender RUE with ecchymosis. Per MD, continue with TR band deflation with close monitoring and continue aggrastat therapy per order.  RN will continue to monitor patient closely.

## 2019-03-12 NOTE — Progress Notes (Signed)
Patient started complaining of pain around his IV site while he was eating lunch. Infiltration was noted and all infusing medication was stopped. His IV was discontinued and his arm was wrapped in a compression bandage and elevated. Pharmacy was notified and at bedside. Patient is resting comfortably. Will continue to monitor.

## 2019-03-13 ENCOUNTER — Inpatient Hospital Stay (HOSPITAL_COMMUNITY): Payer: Medicaid Other

## 2019-03-13 ENCOUNTER — Encounter (HOSPITAL_COMMUNITY): Payer: Self-pay | Admitting: Interventional Cardiology

## 2019-03-13 DIAGNOSIS — I2102 ST elevation (STEMI) myocardial infarction involving left anterior descending coronary artery: Secondary | ICD-10-CM

## 2019-03-13 LAB — POCT ACTIVATED CLOTTING TIME
Activated Clotting Time: 335 seconds
Activated Clotting Time: 263 seconds

## 2019-03-13 LAB — CBC
HCT: 35.4 % — ABNORMAL LOW (ref 39.0–52.0)
Hemoglobin: 12.3 g/dL — ABNORMAL LOW (ref 13.0–17.0)
MCH: 31.1 pg (ref 26.0–34.0)
MCHC: 34.7 g/dL (ref 30.0–36.0)
MCV: 89.6 fL (ref 80.0–100.0)
Platelets: 232 10*3/uL (ref 150–400)
RBC: 3.95 MIL/uL — ABNORMAL LOW (ref 4.22–5.81)
RDW: 14.3 % (ref 11.5–15.5)
WBC: 11.9 10*3/uL — ABNORMAL HIGH (ref 4.0–10.5)
nRBC: 0 % (ref 0.0–0.2)

## 2019-03-13 LAB — BASIC METABOLIC PANEL
Anion gap: 10 (ref 5–15)
BUN: 31 mg/dL — ABNORMAL HIGH (ref 8–23)
CO2: 20 mmol/L — ABNORMAL LOW (ref 22–32)
Calcium: 10 mg/dL (ref 8.9–10.3)
Chloride: 105 mmol/L (ref 98–111)
Creatinine, Ser: 3.44 mg/dL — ABNORMAL HIGH (ref 0.61–1.24)
GFR calc Af Amer: 21 mL/min — ABNORMAL LOW (ref >60)
GFR calc non Af Amer: 18 mL/min — ABNORMAL LOW (ref >60)
Glucose, Bld: 139 mg/dL — ABNORMAL HIGH (ref 70–99)
Potassium: 3.9 mmol/L (ref 3.5–5.1)
Sodium: 135 mmol/L (ref 135–145)

## 2019-03-13 LAB — POCT I-STAT, CHEM 8
BUN: 26 mg/dL — ABNORMAL HIGH (ref 8–23)
Calcium, Ion: 1.44 mmol/L — ABNORMAL HIGH (ref 1.15–1.40)
Chloride: 110 mmol/L (ref 98–111)
Creatinine, Ser: 3 mg/dL — ABNORMAL HIGH (ref 0.61–1.24)
Glucose, Bld: 150 mg/dL — ABNORMAL HIGH (ref 70–99)
HCT: 39 % (ref 39.0–52.0)
Hemoglobin: 13.3 g/dL (ref 13.0–17.0)
Potassium: 3.8 mmol/L (ref 3.5–5.1)
Sodium: 142 mmol/L (ref 135–145)
TCO2: 20 mmol/L — ABNORMAL LOW (ref 22–32)

## 2019-03-13 LAB — HEMOGLOBIN A1C
Hgb A1c MFr Bld: 5.4 % (ref 4.8–5.6)
Mean Plasma Glucose: 108.28 mg/dL

## 2019-03-13 LAB — ECHOCARDIOGRAM COMPLETE
Height: 72 in
Weight: 3216 oz

## 2019-03-13 MED ORDER — ATORVASTATIN CALCIUM 80 MG PO TABS
80.0000 mg | ORAL_TABLET | Freq: Every day | ORAL | Status: DC
  Administered 2019-03-13 – 2019-03-14 (×2): 80 mg via ORAL
  Filled 2019-03-13 (×2): qty 1

## 2019-03-13 MED ORDER — CARVEDILOL 12.5 MG PO TABS
12.5000 mg | ORAL_TABLET | Freq: Two times a day (BID) | ORAL | Status: DC
  Administered 2019-03-13 – 2019-03-14 (×2): 12.5 mg via ORAL
  Filled 2019-03-13: qty 1

## 2019-03-13 NOTE — Progress Notes (Addendum)
Progress Note  Patient Name: [name] Date of Encounter: 03/13/2019  Primary Cardiologist: No primary care provider on file.   Subjective   Feeling much better than yesterday, no chest pain or SOB overnight. He is urinating well.  He states yesterday he woke up around 4am with severe chest pain, which had never happened before and he couldn't get comfortable. He had nausea as well. He has a 40+ year tobacco history. His father passed away from an MI at age 12.   Inpatient Medications    Scheduled Meds: . amLODipine  5 mg Oral Daily  . aspirin  81 mg Oral Daily  . carvedilol  6.25 mg Oral BID WC  . Chlorhexidine Gluconate Cloth  6 each Topical Q0600  . heparin  5,000 Units Subcutaneous Q8H  . isosorbide-hydrALAZINE  1 tablet Oral BID  . sodium chloride flush  3 mL Intravenous Q12H  . ticagrelor  90 mg Oral BID   Continuous Infusions: . sodium chloride Stopped (03/12/19 1558)  . sodium chloride    . nitroGLYCERIN 25 mcg/min (03/13/19 0400)   PRN Meds: sodium chloride, acetaminophen, ondansetron (ZOFRAN) IV, oxyCODONE, sodium chloride flush   Vital Signs    Vitals:   03/13/19 0330 03/13/19 0400 03/13/19 0410 03/13/19 0430  BP: (!) 115/94 (!) 121/96  (!) 142/93  Pulse: (!) 108 (!) 110  (!) 110  Resp: 10 (!) 9  (!) 21  Temp:   98.1 F (36.7 C)   TempSrc:   Axillary   SpO2: 98% 99%  98%  Weight:      Height:        Intake/Output Summary (Last 24 hours) at 03/13/2019 0630 Last data filed at 03/13/2019 0400 Gross per 24 hour  Intake 630.72 ml  Output 875 ml  Net -244.28 ml   Filed Weights   03/12/19 0830  Weight: 91.2 kg    Telemetry    Sinus tachycardia with ST elevation - Personally Reviewed  ECG    11/8 ECG: anterio/latearal, and inferior lead ST elevation, LAD   11/9 ECG: persistent anterio/lateral ST elevation, sinus tachycardia, LAD - Personally Reviewed  Physical Exam   GEN: NAD, appears stated age Neck: No JVD Cardiac: tachycardic,  regular rhythm, no murmurs, rubs, or gallops.  Respiratory: Clear to auscultation bilaterally. GI: Soft, nontender, non-distended, +BS MS: No LE edema;  Neuro:  Nonfocal, CN grossly intact Psych: Normal affect, pleasant  Labs    Chemistry Recent Labs  Lab 03/12/19 0833 03/12/19 1201 03/13/19 0243  NA 139  --  135  K 3.5  --  3.9  CL 106  --  105  CO2 22  --  20*  GLUCOSE 172*  --  139*  BUN 30*  --  31*  CREATININE 3.40* 3.17* 3.44*  CALCIUM 10.5*  --  10.0  GFRNONAA 18* 20* 18*  GFRAA 21* 23* 21*  ANIONGAP 11  --  10     Hematology Recent Labs  Lab 03/12/19 0833 03/12/19 1201 03/12/19 2143 03/13/19 0243  WBC 8.0 10.4  --  11.9*  RBC 4.77 4.65  --  3.95*  HGB 14.6 14.3  --  12.3*  HCT 44.4 42.9  --  35.4*  MCV 93.1 92.3  --  89.6  MCH 30.6 30.8  --  31.1  MCHC 32.9 33.3  --  34.7  RDW 14.6 14.3  --  14.3  PLT 231 232 223 232    Cardiac EnzymesNo results for input(s): TROPONINI in the last  168 hours. No results for input(s): TROPIPOC in the last 168 hours.   BNPNo results for input(s): BNP, PROBNP in the last 168 hours.   DDimer No results for input(s): DDIMER in the last 168 hours.   Radiology    Dg Chest Portable 1 View  Result Date: 03/12/2019 CLINICAL DATA:  Chest pain. EXAM: PORTABLE CHEST 1 VIEW COMPARISON:  Chest radiograph [id number] FINDINGS: Monitoring leads overlie the patient. Stable cardiac and mediastinal contours. Pulmonary vascular redistribution and mild interstitial opacities. No pleural effusion or pneumothorax. IMPRESSION: Bilateral interstitial opacities may represent mild edema or atypical infectious process. Electronically Signed   By: Lovey Newcomer M.D.   On: 03/12/2019 09:13    Cardiac Studies     Cath 03/12/2019  Total occlusion of the mid LAD treated with angioplasty and stenting using a 4.0 x 22 Onyx deployed at 14 atm.  0% stenosis was noted post procedure and TIMI grade III flow increased from 0 to 3.  The final angiographic images  of the LAD demonstrates apical occlusion due to embolus.  Luminal irregularities in the left main but no agreeable stenosis.  Large circumflex with 3 obtuse marginals.  The first marginal is large and has a very distal 90% stenosis in 2 branches.  There is a trifurcation distally in the first marginal.  Moderate diffuse luminal irregularities but widely patent.  Mid anterior wall to apical akinesis.  EF 30 to 35% acutely.  LVEDP 31 mmHg.  RECOMMENDATIONS:   High risk for developing acute kidney injury on top of chronic kidney disease stage IV.  4 hours of hydration of 50 cc/h is ordered.  Monitor kidney function daily  We will need nephrology consultation.  At high risk for developing hypoxia and pulmonary congestion.  If fever, IV Lasix should be instituted.  IV nitroglycerin between 20 and 60 mcg/min for a total of 24 hours at which time the patient should be switched to BiDil 37.5/25 mg 1 tablet twice daily and titrate up.  He is not a candidate for angiotensin receptor blockade/ACE inhibitor therapy due to CKD.  Start carvedilol 3.125 mg p.o. twice daily in a.m.  High risk for complications.  Patient Profile     63 y.o. male with PMH CKD Stage IV, kidney stones, untreated HTN, HLD presenting with chest pain and anterio/lateral, inferior STEMI.   Assessment & Plan     Acute Anterior Inferior STEMI Cath yesterday with 100% occlusion LAD S/p LAD stent placement with persistent apical occlusion 2/2 embolus. ECG from yesterday unchanged. Additional 90% stenosis very distal first marginal. EF 30-35% during cath. Completed aggrastat 18 hours. He is asymptomatic but persistently tachycardic.   - f/u echo  - IV nitro gtt for 24 hours total - switch to bidil 37.5/25 mg bid today  - DAPT 1 year  - increase coreg to 12.15m bid - norvasc 5 mg started yesterday   repeat cxr in the am with likely edema on admission, continues to have high risk of progression of CKD and volume overload.    Uncontrolled HTN Still having mild hypertension.  - norvasc 5 mg started yesterday - increased coreg - starting bidil today  HLD Previously on lovastatin. LDL 114, Triglycerides 184.   - cont. High intensity statin   CKD Stage IV Received lasix 40 mg IV yesterday. Cr & GFR stable today at 3.4, last Cr 2018 was 2.35. He has a history of kidney stones.   - daily weights, strict I/O's  - am CMP  Tobacco  Use Discussed smoking cessation, which he agrees with.   For questions or updates, please contact [place] Please consult [email or web address] for contact info under Cardiology/STEMI.     Signed, Marty Heck, DO  03/13/2019, 6:30 AM    Patient seen, examined. Available data reviewed. Agree with findings, assessment, and plan as outlined by Molli Hazard, DO.  On my exam the patient is alert, oriented, in no distress.  He is breathing comfortably in normal conversation.  JVP is normal, lungs are clear bilaterally.  Heart is tachycardic and regular with no murmur or gallop.  Abdomen is soft and nontender.  Extremities have no edema.  Skin is warm and dry with no rash.  He remains hypertensive with a diastolic blood pressure greater than 90 mmHg.  I have personally reviewed his echo study which demonstrates severe LVH and severe LV dysfunction with a large anteroseptal/anterolateral MI.  The formal interpretation is pending but I would estimate his LVEF less than 35%.  There is a small pericardial effusion present.  RV function appears normal.  I do not appreciate any significant valvular disease.  The patient's post MI medical therapy is complicated by the fact that he has stage IV chronic kidney disease.  He is not a candidate for an ACE inhibitor, ARB, or aldosterone antagonist.  We will increase his carvedilol and titrate Imdur/hydralazine.  Will likely need further diuresis but he does appear euvolemic today.  Will hold IV furosemide at present as he is at risk for acute on chronic  kidney injury after MI and cardiac catheterization.  Repeat metabolic panel and chest XRay tomorrow morning.  Keep in the CCU today.  Anticipate transfer to telemetry tomorrow if he remains stable.  Sherren Mocha, M.D. 03/13/2019 10:37 AM

## 2019-03-13 NOTE — Progress Notes (Signed)
[id number] Came to see pt to walk. Pt had been up in chair and now in bed and stated too sleepy. Began MI education--discussed importance of brilinta with stent, reviewed MI restrictions, and smoking cessation. Gave smoking cessation handout. Pt stated he quit once for 3 months by cold [place],  and plans to quit now. Discussed CRP 2 and referred to [place]. Did not continue ed as pt getting very sleepy. Will follow up tomorrow. Graylon Good RN BSN 03/13/2019 1:22 PM

## 2019-03-13 NOTE — Plan of Care (Signed)

## 2019-03-13 NOTE — Progress Notes (Signed)
  Echocardiogram 2D Echocardiogram has been performed.  [name] 03/13/2019, 9:47 AM

## 2019-03-13 NOTE — Care Management (Signed)
03-13-19 1633 Pt presented for [facility]- post cath. Plan for home on Brilinta. Patient listed as having Medicaid. Brilinta should be no more than $3.50. CM will confirm with patient in am. No further needs identified at this time. Bethena Roys, RN,BSN Case Manager [phone]

## 2019-03-13 NOTE — Progress Notes (Signed)
EKG CRITICAL VALUE     12 lead EKG performed.  Critical value noted.  Misha, RN notified.   Genia Plants, CCT 03/13/2019 6:48 AM

## 2019-03-14 ENCOUNTER — Inpatient Hospital Stay (HOSPITAL_COMMUNITY): Payer: Medicaid Other

## 2019-03-14 DIAGNOSIS — I2102 ST elevation (STEMI) myocardial infarction involving left anterior descending coronary artery: Secondary | ICD-10-CM

## 2019-03-14 LAB — COMPREHENSIVE METABOLIC PANEL
ALT: 50 U/L — ABNORMAL HIGH (ref 0–44)
AST: 209 U/L — ABNORMAL HIGH (ref 15–41)
Albumin: 3 g/dL — ABNORMAL LOW (ref 3.5–5.0)
Alkaline Phosphatase: 70 U/L (ref 38–126)
Anion gap: 8 (ref 5–15)
BUN: 36 mg/dL — ABNORMAL HIGH (ref 8–23)
CO2: 22 mmol/L (ref 22–32)
Calcium: 9.9 mg/dL (ref 8.9–10.3)
Chloride: 106 mmol/L (ref 98–111)
Creatinine, Ser: 3.52 mg/dL — ABNORMAL HIGH (ref 0.61–1.24)
GFR calc Af Amer: 20 mL/min — ABNORMAL LOW (ref >60)
GFR calc non Af Amer: 18 mL/min — ABNORMAL LOW (ref >60)
Glucose, Bld: 115 mg/dL — ABNORMAL HIGH (ref 70–99)
Potassium: 4.1 mmol/L (ref 3.5–5.1)
Sodium: 136 mmol/L (ref 135–145)
Total Bilirubin: 1.3 mg/dL — ABNORMAL HIGH (ref 0.3–1.2)
Total Protein: 6.2 g/dL — ABNORMAL LOW (ref 6.5–8.1)

## 2019-03-14 LAB — CBC
HCT: 32.8 % — ABNORMAL LOW (ref 39.0–52.0)
Hemoglobin: 11 g/dL — ABNORMAL LOW (ref 13.0–17.0)
MCH: 31.3 pg (ref 26.0–34.0)
MCHC: 33.5 g/dL (ref 30.0–36.0)
MCV: 93.4 fL (ref 80.0–100.0)
Platelets: 202 10*3/uL (ref 150–400)
RBC: 3.51 MIL/uL — ABNORMAL LOW (ref 4.22–5.81)
RDW: 14.3 % (ref 11.5–15.5)
WBC: 11.5 10*3/uL — ABNORMAL HIGH (ref 4.0–10.5)
nRBC: 0 % (ref 0.0–0.2)

## 2019-03-14 LAB — ECHOCARDIOGRAM LIMITED
Height: 72 in
Weight: 2977.09 oz

## 2019-03-14 LAB — MAGNESIUM: Magnesium: 1.9 mg/dL (ref 1.7–2.4)

## 2019-03-14 MED ORDER — PERFLUTREN LIPID MICROSPHERE
1.0000 mL | INTRAVENOUS | Status: AC | PRN
  Administered 2019-03-14: 2 mL via INTRAVENOUS
  Filled 2019-03-14: qty 10

## 2019-03-14 MED ORDER — CARVEDILOL 25 MG PO TABS
25.0000 mg | ORAL_TABLET | Freq: Two times a day (BID) | ORAL | Status: DC
  Administered 2019-03-14 – 2019-03-15 (×2): 25 mg via ORAL
  Filled 2019-03-14 (×3): qty 1

## 2019-03-14 NOTE — Progress Notes (Signed)
CARDIAC REHAB PHASE I   PRE:  Rate/Rhythm: 111 ST  BP:  Supine: 106/74  Sitting:   Standing:    SaO2: 100%RA  MODE:  Ambulation: 370 ft   POST:  Rate/Rhythm: 128 ST  BP:  Supine:   Sitting: 107/62  Standing:    SaO2: 97%RA 0945-1040 Pt walked 370 ft on RA with asst x 1 with steady gait. Assisted pt to recliner. He asked for heated blanket which I got for pt. Was in process of education when pt stated he was hot. Turned heat down and took off blankets and BP 85/64.  Put recliner against bed and had pt pivot to bed as he stated he needed to lie down. BP taken again at 110/74.  Pt then stated he needed to "defecate". Put BSC against bed and helped pt . Pt sat a few minutes and then stood and got back in bed. No BM. Asked pt if he felt cooler and he stated yes. HR in 110s ST. No c/o CP. Pt had just received his meds when I entered room. Notified RN of pt's complaints. Pt closing eyes but will open when name called and he answers appropriately. Informed pt will have to complete ed tomorrow and let him rest now. Before pt c/o feeling hot, we had reviewed signs/symptoms of CHF, daily weights, MI restrictions, NTG use, importance of watching sodium. Needs ex ed and diet instruction. Materials at bedside.   Graylon Good, RN BSN  03/14/2019 10:33 AM

## 2019-03-14 NOTE — Progress Notes (Signed)
Dr Burt Knack paged at pt's request and notified pt wants his brother, Chrissie Noa called and updated.

## 2019-03-14 NOTE — Progress Notes (Signed)
   Review of patient's chart, will need orders for lifevest at the time of discharge given anterior MI with EF noted at 25-30%. Is at increased risk for arrhythmia. Orders completed and faxed. Rep notified.   Barnet Pall, NP-C 03/14/2019, 11:45 AM Pager: [phone]

## 2019-03-14 NOTE — Progress Notes (Addendum)
Progress Note  Patient Name: [name] Date of Encounter: 03/14/2019  Primary Cardiologist: No primary care provider on file.   Subjective   Feeling well, No chest pain since his PCI. No SOB. No difficulty with urination. He has not seen nephrology in about two years.   Inpatient Medications    Scheduled Meds: . amLODipine  5 mg Oral Daily  . aspirin  81 mg Oral Daily  . atorvastatin  80 mg Oral q1800  . carvedilol  12.5 mg Oral BID WC  . Chlorhexidine Gluconate Cloth  6 each Topical Q0600  . heparin  5,000 Units Subcutaneous Q8H  . isosorbide-hydrALAZINE  1 tablet Oral BID  . sodium chloride flush  3 mL Intravenous Q12H  . ticagrelor  90 mg Oral BID   Continuous Infusions: . sodium chloride Stopped (03/12/19 1558)  . sodium chloride     PRN Meds: sodium chloride, acetaminophen, ondansetron (ZOFRAN) IV, oxyCODONE, sodium chloride flush   Vital Signs    Vitals:   03/14/19 0500 03/14/19 0515 03/14/19 0530 03/14/19 0545  BP: 109/78 108/73 120/78   Pulse: (!) 114 (!) 111 (!) 124   Resp: (!) 21 (!) 27 (!) 22 (!) 31  Temp:      TempSrc:      SpO2: 97% 98% 96%   Weight: 84.4 kg     Height:        Intake/Output Summary (Last 24 hours) at 03/14/2019 4970 Last data filed at 03/14/2019 0602 Gross per 24 hour  Intake 489 ml  Output 1000 ml  Net -511 ml   Filed Weights   03/12/19 0830 03/14/19 0500  Weight: 91.2 kg 84.4 kg    Telemetry    Sinus tachycardic - Personally Reviewed  ECG    11/8 ECG: anterio/latearal, and inferior lead ST elevation, LAD   11/9 ECG: persistent anterio/lateral ST elevation, sinus tachycardia, LAD - Personally Reviewed  Physical Exam   GEN: NAD, appears stated age Neck: No JVD Cardiac: tachycardic, regular rhythm, no murmurs, rubs, or gallops.  Respiratory: Clear to auscultation bilaterally. GI: Soft, nontender, non-distended, +BS MS: No LE edema Neuro:  Nonfocal, CN grossly intact Psych: Normal affect, pleasant   Labs    Chemistry Recent Labs  Lab 03/12/19 0833 03/12/19 1015 03/12/19 1201 03/13/19 0243 03/14/19 0245  NA 139 142  --  135 136  K 3.5 3.8  --  3.9 4.1  CL 106 110  --  105 106  CO2 22  --   --  20* 22  GLUCOSE 172* 150*  --  139* 115*  BUN 30* 26*  --  31* 36*  CREATININE 3.40* 3.00* 3.17* 3.44* 3.52*  CALCIUM 10.5*  --   --  10.0 9.9  PROT  --   --   --   --  6.2*  ALBUMIN  --   --   --   --  3.0*  AST  --   --   --   --  209*  ALT  --   --   --   --  50*  ALKPHOS  --   --   --   --  70  BILITOT  --   --   --   --  1.3*  GFRNONAA 18*  --  20* 18* 18*  GFRAA 21*  --  23* 21* 20*  ANIONGAP 11  --   --  10 8     Hematology Recent Labs  Lab 03/12/19 1201 03/12/19  2143 03/13/19 0243 03/14/19 0245  WBC 10.4  --  11.9* 11.5*  RBC 4.65  --  3.95* 3.51*  HGB 14.3  --  12.3* 11.0*  HCT 42.9  --  35.4* 32.8*  MCV 92.3  --  89.6 93.4  MCH 30.8  --  31.1 31.3  MCHC 33.3  --  34.7 33.5  RDW 14.3  --  14.3 14.3  PLT 232 223 232 202    Cardiac EnzymesNo results for input(s): TROPONINI in the last 168 hours. No results for input(s): TROPIPOC in the last 168 hours.   BNPNo results for input(s): BNP, PROBNP in the last 168 hours.   DDimer No results for input(s): DDIMER in the last 168 hours.   Radiology    Dg Chest 2 View  Result Date: 03/14/2019 CLINICAL DATA:  Onset chest pain 03/12/2019. Coronary artery disease. EXAM: CHEST - 2 VIEW COMPARISON:  Single-view of the chest 03/12/2019. FINDINGS: The lungs are clear. No pneumothorax or pleural fluid. Heart size is normal. No acute or focal bony abnormality. Thoracic spondylosis noted. IMPRESSION: No acute disease. Electronically Signed   By: Inge Rise M.D.   On: 03/14/2019 06:02   Dg Chest Portable 1 View  Result Date: 03/12/2019 CLINICAL DATA:  Chest pain. EXAM: PORTABLE CHEST 1 VIEW COMPARISON:  Chest radiograph [id number] FINDINGS: Monitoring leads overlie the patient. Stable cardiac and mediastinal contours.  Pulmonary vascular redistribution and mild interstitial opacities. No pleural effusion or pneumothorax. IMPRESSION: Bilateral interstitial opacities may represent mild edema or atypical infectious process. Electronically Signed   By: Lovey Newcomer M.D.   On: 03/12/2019 09:13    Cardiac Studies   ECHO 11/9 IMPRESSIONS 1. Left ventricular ejection fraction, by visual estimation, is 25 to 30%. The left ventricle has severely decreased function. There is severely increased left ventricular hypertrophy.  2. Multiple segmental abnormalities exist. See findings.  3. Apex is not well visualized. Considering apical akinesis would consider limited echo with contrast to rule out apical thrombus  4. Global right ventricle has normal systolic function.The right ventricular size is normal. No increase in right ventricular wall thickness.  5. Left atrial size was normal.  6. Right atrial size was normal.  7. Small pericardial effusion.  8. The pericardial effusion is anterior to the right ventricle.  9. The mitral valve is normal in structure. No evidence of mitral valve regurgitation. 10. The tricuspid valve is not well visualized. Tricuspid valve regurgitation is not demonstrated. 11. The aortic valve is tricuspid. Aortic valve regurgitation is not visualized. No evidence of aortic valve sclerosis or stenosis. 12. The pulmonic valve was not well visualized. Pulmonic valve regurgitation is not visualized. 13. The inferior vena cava is dilated in size with >50% respiratory variability, suggesting right atrial pressure of 8 mmHg. 14. TR signal is inadequate for assessing pulmonary artery systolic pressure.  Cath 03/12/2019  Total occlusion of the mid LAD treated with angioplasty and stenting using a 4.0 x 22 Onyx deployed at 14 atm.  0% stenosis was noted post procedure and TIMI grade III flow increased from 0 to 3.  The final angiographic images of the LAD demonstrates apical occlusion due to embolus.   Luminal irregularities in the left main but no agreeable stenosis.  Large circumflex with 3 obtuse marginals.  The first marginal is large and has a very distal 90% stenosis in 2 branches.  There is a trifurcation distally in the first marginal.  Moderate diffuse luminal irregularities but widely patent.  Mid anterior  wall to apical akinesis.  EF 30 to 35% acutely.  LVEDP 31 mmHg.  RECOMMENDATIONS:   High risk for developing acute kidney injury on top of chronic kidney disease stage IV.  4 hours of hydration of 50 cc/h is ordered.  Monitor kidney function daily  We will need nephrology consultation.  At high risk for developing hypoxia and pulmonary congestion.  If fever, IV Lasix should be instituted.  IV nitroglycerin between 20 and 60 mcg/min for a total of 24 hours at which time the patient should be switched to BiDil 37.5/25 mg 1 tablet twice daily and titrate up.  He is not a candidate for angiotensin receptor blockade/ACE inhibitor therapy due to CKD.  Start carvedilol 3.125 mg p.o. twice daily in a.m.  High risk for complications.  Patient Profile     63 y.o. male with PMH CKD Stage IV, kidney stones, untreated HTN, HLD presenting with chest pain and anterio/lateral, inferior STEMI. Underwent cath with 100% occlusion LAD and stent placement, found to have persistent apical occlusion 2/2 embolus.    Assessment & Plan     Acute Anterior Inferior STEMI S/p LAD stent with persistent apical emboli. Echo yesterday demonstrates EF 25-30% w/severely decreased LV function & hypertrophy, apical akinesis. He continues to have sinus tachycardia, is otherwise hemodynamically stable.   - echo with contrast to r/o thrombus with apical akinesis - cont. bidil 37.5/25 mg bid today  - DAPT 1 year  - increased coreg to 25 mg bid - transfer out of ICU today - will discharge with LifeVest as he is at increased risk for arrhythmia.   Uncontrolled HTN Normotensive on norvasc 5 mg, bidil,  and coreg 12.5 mg qd.   HLD Cont. High intensity statin  AKI on CKD Stage IV GFR and creatinine stable, but have increased from 3.0 to 3.52 today. Will need outpatient follow-up with nephrology, he last saw them two years ago after lithotripsy for kidney stones. CXR this morning without signs of volume overload.   - daily weights, strict I/O's  - am CMP  Tobacco Use Discussed smoking cessation, which he agrees with.   For questions or updates, please contact [place] Please consult [email or web address] for contact info under Cardiology/STEMI.     Signed, Marty Heck, DO  03/14/2019, 6:35 AM    Patient seen, examined. Available data reviewed. Agree with findings, assessment, and plan as outlined by Dr Sharon Seller.  The patient is alert, oriented, in no distress.  JVP is normal, lungs are clear, heart is tachycardic and regular with no murmur or gallop, abdomen is soft and nontender, extremities have no edema, skin is warm and dry without rash.  Review of telemetry demonstrates sinus tachycardia with no significant arrhythmia.  I agree with plans as outlined above.  The patient's echo study is reviewed and he has severe LV dysfunction related to his acute anterior wall MI with LVEF less than 30%.  I discussed considerations around primary prevention of sudden cardiac death after a large infarct.  I have recommended that we discharge him on a LifeVest for 3 months before a repeat echocardiogram is performed.  He understands the rationale for this and is willing to proceed.  A LifeVest consultation will be placed.  Carvedilol will be increased.  I am going to hold amlodipine as it would be ideal to treat him with a combination of carvedilol, isosorbide, and hydralazine in light of his stage IV chronic kidney disease.  We will repeat a metabolic panel tomorrow, but  it appears as renal function has remained stable.  He will need outpatient nephrology follow-up.  He already has an appointment scheduled  to establish with a new primary care physician.  He will be transferred to telemetry today.  Sherren Mocha, M.D. 03/14/2019 10:59 AM

## 2019-03-14 NOTE — Care Management (Signed)
1401 03-14-19 Information for [place] to be faxed to company for authorization. CM will continue to monitor for additional transition of care needs. Bethena Roys , RN,BSN, Case Manager [phone]

## 2019-03-15 DIAGNOSIS — I219 Acute myocardial infarction, unspecified: Secondary | ICD-10-CM

## 2019-03-15 HISTORY — DX: Acute myocardial infarction, unspecified: I21.9

## 2019-03-15 LAB — BASIC METABOLIC PANEL
Anion gap: 11 (ref 5–15)
BUN: 41 mg/dL — ABNORMAL HIGH (ref 8–23)
CO2: 19 mmol/L — ABNORMAL LOW (ref 22–32)
Calcium: 10.1 mg/dL (ref 8.9–10.3)
Chloride: 106 mmol/L (ref 98–111)
Creatinine, Ser: 3.46 mg/dL — ABNORMAL HIGH (ref 0.61–1.24)
GFR calc Af Amer: 21 mL/min — ABNORMAL LOW (ref >60)
GFR calc non Af Amer: 18 mL/min — ABNORMAL LOW (ref >60)
Glucose, Bld: 115 mg/dL — ABNORMAL HIGH (ref 70–99)
Potassium: 3.8 mmol/L (ref 3.5–5.1)
Sodium: 136 mmol/L (ref 135–145)

## 2019-03-15 MED ORDER — ISOSORB DINITRATE-HYDRALAZINE 20-37.5 MG PO TABS: 1.0000 | ORAL_TABLET | Freq: Two times a day (BID) | ORAL | 1 refills | Status: DC

## 2019-03-15 MED ORDER — TICAGRELOR 90 MG PO TABS: 90.0000 mg | ORAL_TABLET | Freq: Two times a day (BID) | ORAL | 1 refills | Status: DC

## 2019-03-15 MED ORDER — ASPIRIN 81 MG PO CHEW: 81.0000 mg | CHEWABLE_TABLET | Freq: Every day | ORAL | 1 refills | Status: DC

## 2019-03-15 MED ORDER — ATORVASTATIN CALCIUM 80 MG PO TABS: 80.0000 mg | ORAL_TABLET | Freq: Every day | ORAL | 1 refills | Status: DC

## 2019-03-15 MED ORDER — CARVEDILOL 25 MG PO TABS: 25.0000 mg | ORAL_TABLET | Freq: Two times a day (BID) | ORAL | 1 refills | Status: DC

## 2019-03-15 MED ORDER — NITROGLYCERIN 0.4 MG SL SUBL: 0.4000 mg | SUBLINGUAL_TABLET | SUBLINGUAL | 2 refills | Status: DC | PRN

## 2019-03-15 MED FILL — ASPIRIN LOW DOSE 81 MG CHEW: 81 | 90 days supply | Qty: 90 | Fill #0

## 2019-03-15 MED FILL — ATORVASTATIN CALCIUM 80 MG: 80 | 90 days supply | Qty: 90 | Fill #0

## 2019-03-15 MED FILL — BIDIL TABLET: 20-37.5 | 30 days supply | Qty: 60 | Fill #0

## 2019-03-15 MED FILL — CARVEDILOL 25 MG TABLET: 25 | 30 days supply | Qty: 60 | Fill #0

## 2019-03-15 MED FILL — BRILINTA 90 MG TABLET: 90 | 30 days supply | Qty: 60 | Fill #0

## 2019-03-15 MED FILL — NITROGLYCERIN 0.4 MG TAB SL: 0.4 | 8 days supply | Qty: 25 | Fill #0

## 2019-03-15 NOTE — Plan of Care (Signed)

## 2019-03-15 NOTE — Progress Notes (Signed)
CARDIAC REHAB PHASE I   PRE:  Rate/Rhythm: 93 SR  BP:  Supine: 98/66  Sitting:   Standing:    SaO2: 97%RA  MODE:  Ambulation: 400 ft   POST:  Rate/Rhythm: 115 ST  BP:  Supine:   Sitting: 101/70  Standing:    SaO2: 98%RA 1045-1125 Pt sleepy. Woke pt to walk since he is for tentative discharge. Gave ex ed which is walking instructions and encouraged him to watch sodium. Pt walked 400 ft with no complaints. Put on new gown for pt, new pad and gave sprite.  Pt resting and looking forward to going home.    Graylon Good, RN BSN  03/15/2019 11:20 AM

## 2019-03-15 NOTE — Progress Notes (Signed)
Discussed and explained discharge instructions to pt. Exit care notes given on heart attack and heart failure. Transitional pharmacy delivered medication to room. [facility] lifevest teaching patient how to use lifevest. Going home with brother.

## 2019-03-15 NOTE — Plan of Care (Signed)
  Problem: Education: Goal: Knowledge of General Education information will improve Description: Including pain rating scale, medication(s)/side effects and non-pharmacologic comfort measures 03/15/2019 1414 by Don Perking, RN Outcome: Completed/Met 03/15/2019 0738 by Don Perking, RN Outcome: Progressing   Problem: Health Behavior/Discharge Planning: Goal: Ability to manage health-related needs will improve 03/15/2019 1414 by Don Perking, RN Outcome: Completed/Met 03/15/2019 0738 by Don Perking, RN Outcome: Progressing   Problem: Clinical Measurements: Goal: Ability to maintain clinical measurements within normal limits will improve 03/15/2019 1414 by Don Perking, RN Outcome: Completed/Met 03/15/2019 0738 by Don Perking, RN Outcome: Progressing Goal: Will remain free from infection 03/15/2019 1414 by Don Perking, RN Outcome: Completed/Met 03/15/2019 0738 by Don Perking, RN Outcome: Progressing Goal: Diagnostic test results will improve 03/15/2019 1414 by Don Perking, RN Outcome: Completed/Met 03/15/2019 0738 by Don Perking, RN Outcome: Progressing Goal: Respiratory complications will improve 03/15/2019 1414 by Don Perking, RN Outcome: Completed/Met 03/15/2019 0738 by Don Perking, RN Outcome: Progressing Goal: Cardiovascular complication will be avoided 03/15/2019 1414 by Don Perking, RN Outcome: Completed/Met 03/15/2019 0738 by Don Perking, RN Outcome: Progressing   Problem: Activity: Goal: Risk for activity intolerance will decrease 03/15/2019 1414 by Don Perking, RN Outcome: Completed/Met 03/15/2019 0738 by Don Perking, RN Outcome: Progressing   Problem: Nutrition: Goal: Adequate nutrition will be maintained 03/15/2019 1414 by Don Perking, RN Outcome: Completed/Met 03/15/2019 0738 by Don Perking, RN Outcome: Progressing   Problem:  Coping: Goal: Level of anxiety will decrease 03/15/2019 1414 by Don Perking, RN Outcome: Completed/Met 03/15/2019 0738 by Don Perking, RN Outcome: Progressing   Problem: Elimination: Goal: Will not experience complications related to bowel motility 03/15/2019 1414 by Don Perking, RN Outcome: Completed/Met 03/15/2019 0738 by Don Perking, RN Outcome: Progressing Goal: Will not experience complications related to urinary retention 03/15/2019 1414 by Don Perking, RN Outcome: Completed/Met 03/15/2019 0738 by Don Perking, RN Outcome: Progressing   Problem: Pain Managment: Goal: General experience of comfort will improve 03/15/2019 1414 by Don Perking, RN Outcome: Completed/Met 03/15/2019 0738 by Don Perking, RN Outcome: Progressing   Problem: Safety: Goal: Ability to remain free from injury will improve 03/15/2019 1414 by Don Perking, RN Outcome: Completed/Met 03/15/2019 0738 by Don Perking, RN Outcome: Progressing   Problem: Skin Integrity: Goal: Risk for impaired skin integrity will decrease 03/15/2019 1414 by Don Perking, RN Outcome: Completed/Met 03/15/2019 0738 by Don Perking, RN Outcome: Progressing

## 2019-03-15 NOTE — Progress Notes (Addendum)
Progress Note  Patient Name: [name] Date of Encounter: 03/15/2019  Primary Cardiologist: No primary care provider on file.   Subjective   No CP or SOB, asking when he is able to leave today.    Inpatient Medications    Scheduled Meds:  aspirin  81 mg Oral Daily   atorvastatin  80 mg Oral q1800   carvedilol  25 mg Oral BID WC   Chlorhexidine Gluconate Cloth  6 each Topical Q0600   heparin  5,000 Units Subcutaneous Q8H   isosorbide-hydrALAZINE  1 tablet Oral BID   sodium chloride flush  3 mL Intravenous Q12H   ticagrelor  90 mg Oral BID   Continuous Infusions:  sodium chloride Stopped (03/12/19 1558)   sodium chloride     PRN Meds: sodium chloride, acetaminophen, ondansetron (ZOFRAN) IV, oxyCODONE, sodium chloride flush   Vital Signs    Vitals:   03/14/19 2334 03/15/19 0056 03/15/19 0328 03/15/19 0556  BP: 90/74  110/79   Pulse: (!) 106 100    Resp: 19 20    Temp: 99.3 F (37.4 C)  99.2 F (37.3 C)   TempSrc: Oral  Oral   SpO2: 96% 100% 100%   Weight:    83.9 kg  Height:        Intake/Output Summary (Last 24 hours) at 03/15/2019 0653 Last data filed at 03/14/2019 0900 Gross per 24 hour  Intake --  Output 180 ml  Net -180 ml   Filed Weights   03/14/19 0500 03/14/19 1751 03/15/19 0556  Weight: 84.4 kg 84.5 kg 83.9 kg    Telemetry    Sinus tachycardic ~104 - Personally Reviewed  ECG    11/8 ECG: anterio/latearal, and inferior lead ST elevation, LAD   11/9 ECG: persistent anterio/lateral ST elevation, sinus tachycardia, LAD - Personally Reviewed  Physical Exam   GEN: NAD, sitting at bedside Neck: No JVD Cardiac: tachycardic, regular rhythm, no murmurs, rubs, or gallops.  Respiratory: Clear to auscultation bilaterally. GI: Soft, nontender, non-distended, +BS MS: No LE edema Neuro:  Nonfocal, CN grossly intact Psych: Normal affect, pleasant  Labs    Chemistry Recent Labs  Lab 03/12/19 0833 03/12/19 1015  03/12/19 1201 03/13/19 0243 03/14/19 0245  NA 139 142  --  135 136  K 3.5 3.8  --  3.9 4.1  CL 106 110  --  105 106  CO2 22  --   --  20* 22  GLUCOSE 172* 150*  --  139* 115*  BUN 30* 26*  --  31* 36*  CREATININE 3.40* 3.00* 3.17* 3.44* 3.52*  CALCIUM 10.5*  --   --  10.0 9.9  PROT  --   --   --   --  6.2*  ALBUMIN  --   --   --   --  3.0*  AST  --   --   --   --  209*  ALT  --   --   --   --  50*  ALKPHOS  --   --   --   --  70  BILITOT  --   --   --   --  1.3*  GFRNONAA 18*  --  20* 18* 18*  GFRAA 21*  --  23* 21* 20*  ANIONGAP 11  --   --  10 8     Hematology Recent Labs  Lab 03/12/19 1201 03/12/19 2143 03/13/19 0243 03/14/19 0245  WBC 10.4  --  11.9* 11.5*  RBC 4.65  --  3.95* 3.51*  HGB 14.3  --  12.3* 11.0*  HCT 42.9  --  35.4* 32.8*  MCV 92.3  --  89.6 93.4  MCH 30.8  --  31.1 31.3  MCHC 33.3  --  34.7 33.5  RDW 14.3  --  14.3 14.3  PLT 232 223 232 202    Cardiac EnzymesNo results for input(s): TROPONINI in the last 168 hours. No results for input(s): TROPIPOC in the last 168 hours.   BNPNo results for input(s): BNP, PROBNP in the last 168 hours.   DDimer No results for input(s): DDIMER in the last 168 hours.   Radiology    Dg Chest 2 View  Result Date: 03/14/2019 CLINICAL DATA:  Onset chest pain 03/12/2019. Coronary artery disease. EXAM: CHEST - 2 VIEW COMPARISON:  Single-view of the chest 03/12/2019. FINDINGS: The lungs are clear. No pneumothorax or pleural fluid. Heart size is normal. No acute or focal bony abnormality. Thoracic spondylosis noted. IMPRESSION: No acute disease. Electronically Signed   By: Inge Rise M.D.   On: 03/14/2019 06:02    Cardiac Studies   ECHO w/contrast 11/10 IMPRESSIONS    1. Left ventricular ejection fraction, by visual estimation, is 20%. The left ventricle has severely decreased function. There is no left ventricular hypertrophy. The anteroseptal and inferoseptal walls, the mid to apical anterior wall, and  the apex are  akinetic. The inferior wall is severely hypokinetic. The inferolateral and anterolateral walls are hypokinetic but are the most well preserved. No LV thrombus is seen, Definity was used.  2. Global right ventricle has normal systolic function.The right ventricular size is normal. No increase in right ventricular wall thickness.  3. Left atrial size was normal.  4. Right atrial size was normal.  ECHO 11/9 IMPRESSIONS 1. Left ventricular ejection fraction, by visual estimation, is 25 to 30%. The left ventricle has severely decreased function. There is severely increased left ventricular hypertrophy.  2. Multiple segmental abnormalities exist. See findings.  3. Apex is not well visualized. Considering apical akinesis would consider limited echo with contrast to rule out apical thrombus  4. Global right ventricle has normal systolic function.The right ventricular size is normal. No increase in right ventricular wall thickness.  5. Left atrial size was normal.  6. Right atrial size was normal.  7. Small pericardial effusion.  8. The pericardial effusion is anterior to the right ventricle.  9. The mitral valve is normal in structure. No evidence of mitral valve regurgitation. 10. The tricuspid valve is not well visualized. Tricuspid valve regurgitation is not demonstrated. 11. The aortic valve is tricuspid. Aortic valve regurgitation is not visualized. No evidence of aortic valve sclerosis or stenosis. 12. The pulmonic valve was not well visualized. Pulmonic valve regurgitation is not visualized. 13. The inferior vena cava is dilated in size with >50% respiratory variability, suggesting right atrial pressure of 8 mmHg. 14. TR signal is inadequate for assessing pulmonary artery systolic pressure.  Cath 03/12/2019  Total occlusion of the mid LAD treated with angioplasty and stenting using a 4.0 x 22 Onyx deployed at 14 atm.  0% stenosis was noted post procedure and TIMI grade III flow  increased from 0 to 3.  The final angiographic images of the LAD demonstrates apical occlusion due to embolus.  Luminal irregularities in the left main but no agreeable stenosis.  Large circumflex with 3 obtuse marginals.  The first marginal is large and has a very distal 90% stenosis in 2 branches.  There is a trifurcation distally in the first marginal.  Moderate diffuse luminal irregularities but widely patent.  Mid anterior wall to apical akinesis.  EF 30 to 35% acutely.  LVEDP 31 mmHg.  RECOMMENDATIONS:   High risk for developing acute kidney injury on top of chronic kidney disease stage IV.  4 hours of hydration of 50 cc/h is ordered.  Monitor kidney function daily  We will need nephrology consultation.  At high risk for developing hypoxia and pulmonary congestion.  If fever, IV Lasix should be instituted.  IV nitroglycerin between 20 and 60 mcg/min for a total of 24 hours at which time the patient should be switched to BiDil 37.5/25 mg 1 tablet twice daily and titrate up.  He is not a candidate for angiotensin receptor blockade/ACE inhibitor therapy due to CKD.  Start carvedilol 3.125 mg p.o. twice daily in a.m.  High risk for complications.  Patient Profile     63 y.o. male with PMH CKD Stage IV, kidney stones, untreated HTN, HLD presenting with chest pain and anterio/lateral, inferior STEMI. Underwent cath with 100% occlusion LAD and stent placement, found to have persistent apical occlusion 2/2 embolus.    Assessment & Plan     Acute Anterior Inferior STEMI S/p LAD stent with persistent apical emboli. Echo with contrast yesterday to ruled out apical thrombus but showed EF 20%. Prior echo demonstrates EF 25-30% w/severely decreased LV function & hypertrophy, apical akinesis. His sinus tachycardia is significantly improved.  He is planning a knee replacement at some point, discussed that this cannot be done for at least six months as he will need consistent DAPT.   -  cont. bidil 37.5/25 mg bid   - DAPT 1 year  - cont. coreg to 25 mg bid - stable for discharge once he recieves life vest.    Uncontrolled HTN Normotensive on norvasc 5 mg, bidil, and coreg 12.5 mg qd.   HLD Cont. High intensity statin  CKD Stage IV GFR and creatinine stable. Will follow-up with nephrology outpatient.   Tobacco Use Discussed smoking cessation, which he agrees with.   For questions or updates, please contact [facility] Please consult [email or web address] for contact info under Cardiology/STEMI.     Signed, Marty Heck, DO  03/15/2019, 6:53 AM    Patient seen, examined. Available data reviewed. Agree with findings, assessment, and plan as outlined by Dr Sharon Seller.  On my exam, the patient is alert, oriented, in no distress.  JVP is normal, lung fields are clear, heart is regular rate and rhythm with no murmur or gallop, abdomen is soft and nontender, extremities have no edema.  Telemetry reviewed shows sinus rhythm with heart rate in the mid 90s with no ventricular ectopy or runs.  The patient's medical program is reviewed and will be continued without change.  He is not a candidate for ACE/ARB/ARN I/Spironolactone because of stage IV kidney disease.  Kidney function is stable today.  Hemodynamics are stable and he has had no recurrent chest pain.  I think he is ready for discharge.  He is being fitted for a LifeVest today at discharge.  We have explained the rationale for this to him.  He will follow up in the advanced heart failure clinic with Dr. Haroldine Laws.  Sherren Mocha, M.D. 03/15/2019 9:57 AM

## 2019-03-15 NOTE — Discharge Instructions (Signed)
Heart Attack A heart attack occurs when blood and oxygen supply to the heart is cut off. A heart attack causes damage to the heart that cannot be fixed. A heart attack is also called a myocardial infarction, or MI. If you think you are having a heart attack, do not wait to see if the symptoms will go away. Get medical help right away. What are the causes? This condition may be caused by:  A fatty substance (plaque) in the blood vessels (arteries). This can block the flow of blood to the heart.  A blood clot in the blood vessels that go to the heart. The blood clot blocks blood flow.  Low blood pressure.  An abnormal heartbeat.  Some diseases, such as problems in red blood cells (anemia)orproblems in breathing (respiratory failure).  Tightening (spasm) of a blood vessel that cuts off blood to the heart.  A tear in a blood vessel of the heart.  High blood pressure. What increases the risk? The following factors may make you more likely to develop this condition:  Aging. The older you are, the higher your risk.  Having a personal or family history of chest pain, heart attack, stroke, or narrowing of the arteries in the legs, arms, head, or stomach (peripheral artery disease).  Being male.  Smoking.  Not getting regular exercise.  Being overweight or obese.  Having high blood pressure.  Having high cholesterol.  Having diabetes.  Drinking too much alcohol.  Using illegal drugs, such as cocaine or methamphetamine. What are the signs or symptoms? Symptoms of this condition include:  Chest pain. It may feel like: ? Crushing or squeezing. ? Tightness, pressure, fullness, or heaviness.  Pain in the arm, neck, jaw, back, or upper body.  Shortness of breath.  Heartburn.  Upset stomach (indigestion).  Feeling like you may vomit (nauseous).  Cold sweats.  Feeling tired.  Sudden light-headedness. How is this treated? A heart attack must be treated as soon as  possible. Treatment may include:  Medicines to: ? Break up or dissolve blood clots. ? Thin blood and help prevent blood clots. ? Treat blood pressure. ? Improve blood flow to the heart. ? Reduce pain. ? Reduce cholesterol.  Procedures to widen a blocked artery and keep it open.  Open heart surgery.  Receiving oxygen.  Making your heart strong again (cardiac rehabilitation) through exercise, education, and counseling. Follow these instructions at home: Medicines  Take over-the-counter and prescription medicines only as told by your doctor. You may need to take medicine: ? To keep your blood from clotting too easily. ? To control blood pressure. ? To lower cholesterol. ? To control heart rhythms.  Do not take these medicines unless your doctor says it is okay: ? NSAIDs, such as ibuprofen. ? Supplements that have vitamin A, vitamin E, or both. ? Hormone replacement therapy that has estrogen with or without progestin. Lifestyle      Do not use any products that have nicotine or tobacco, such as cigarettes, e-cigarettes, and chewing tobacco. If you need help quitting, ask your doctor.  Avoid secondhand smoke.  Exercise regularly. Ask your doctor about a cardiac rehab program.  Eat heart-healthy foods. Your doctor will tell you what foods to eat.  Stay at a healthy weight.  Lower your stress level.  Do not use illegal drugs. Alcohol use  Do not drink alcohol if: ? Your doctor tells you not to drink. ? You are pregnant, may be pregnant, or are planning to become pregnant.  If you drink alcohol: ? Limit how much you use to:  0-1 drink a day for women.  0-2 drinks a day for men. ? Know how much alcohol is in your drink. In the [place] one drink equals one 12 oz bottle of beer (355 mL), one 5 oz glass of wine (148 mL), or one 1 oz glass of hard liquor (44 mL). General instructions  Work with your doctor to treat other problems you may have, such as diabetes or high  blood pressure.  Get screened for depression. Get treatment if needed.  Keep your vaccines up to date. Get the flu shot (influenza vaccine) every year.  Keep all follow-up visits as told by your doctor. This is important. Contact a doctor if:  You feel very sad.  You have trouble doing your daily activities. Get help right away if:  You have sudden, unexplained discomfort in your chest, arms, back, neck, jaw, or upper body.  You have shortness of breath.  You have sudden sweating or clammy skin.  You feel like you may vomit.  You vomit.  You feel tired or weak.  You get light-headed or dizzy.  You feel your heart beating fast.  You feel your heart skipping beats.  You have blood pressure that is higher than 180/120. These symptoms may be an emergency. Do not wait to see if the symptoms will go away. Get medical help right away. Call your local emergency services (911 in the [place] Do not drive yourself to the hospital. Summary  A heart attack occurs when blood and oxygen supply to the heart is cut off.  Do not take NSAIDs unless your doctor says it is okay.  Do not smoke. Avoid secondhand smoke.  Exercise regularly. Ask your doctor about a cardiac rehab program. This information is not intended to replace advice given to you by your health care provider. Make sure you discuss any questions you have with your health care provider. Document Released: 10/20/2011 Document Revised: 08/01/2018 Document Reviewed: 08/01/2018 Elsevier Patient Education  [place].

## 2019-03-15 NOTE — Consult Note (Signed)
[facility] Nurse wound consult note Reason for Consult: Consult requested for right wrist.  Pt has generalized edema and erythremia after cath procedure was performed.   Wound type: Right wrist with clear fluid filled blister Measurement: 5X2.7cm intact skin Dressing procedure/placement/frequency: Topical treatment orders provided for bedside nurses to perform daily as follows: Apply double-folded xeroform gauze to right wrist Q day, then cover with foam dressing.  (Change foam dressing Q 3 days or PRN soiling.) Please re-consult if further assistance is needed.  Thank-you,  Julien Girt MSN, [place], [place], [place], [place]

## 2019-03-15 NOTE — Discharge Summary (Signed)
Discharge Summary    Patient ID: [name],  MRN: [id number], DOB/AGE: 1955/10/24 63 y.o.  Admit date: 03/12/2019 Discharge date: 03/15/2019  Primary Care Provider: Doree Albee Primary Cardiologist: Dr. Daneen Schick  Discharge Diagnoses    Active Problems:   ST elevation myocardial infarction (STEMI) [facility])   Acute systolic heart failure (HCC)   Acute ST elevation myocardial infarction (STEMI) due to occlusion of mid portion of left anterior descending (LAD) coronary artery (HCC)   Allergies No Known Allergies  Diagnostic Studies/Procedures    ECHO w/contrast 11/10 IMPRESSIONS  1. Left ventricular ejection fraction, by visual estimation, is 20%. The left ventricle has severely decreased function. There is no left ventricular hypertrophy. The anteroseptal and inferoseptal walls, the mid to apical anterior wall, and the apex are  akinetic. The inferior wall is severely hypokinetic. The inferolateral and anterolateral walls are hypokinetic but are the most well preserved. No LV thrombus is seen, Definity was used.  2. Global right ventricle has normal systolic function.The right ventricular size is normal. No increase in right ventricular wall thickness.  3. Left atrial size was normal.  4. Right atrial size was normal.  ECHO 11/9 IMPRESSIONS 1. Left ventricular ejection fraction, by visual estimation, is 25 to 30%. The left ventricle has severely decreased function. There is severely increased left ventricular hypertrophy.  2. Multiple segmental abnormalities exist. See findings.  3. Apex is not well visualized. Considering apical akinesis would consider limited echo with contrast to rule out apical thrombus  4. Global right ventricle has normal systolic function.The right ventricular size is normal. No increase in right ventricular wall thickness.  5. Left atrial size was normal.  6. Right atrial size was normal.  7. Small pericardial effusion.  8. The  pericardial effusion is anterior to the right ventricle.  9. The mitral valve is normal in structure. No evidence of mitral valve regurgitation. 10. The tricuspid valve is not well visualized. Tricuspid valve regurgitation is not demonstrated. 11. The aortic valve is tricuspid. Aortic valve regurgitation is not visualized. No evidence of aortic valve sclerosis or stenosis. 12. The pulmonic valve was not well visualized. Pulmonic valve regurgitation is not visualized. 13. The inferior vena cava is dilated in size with >50% respiratory variability, suggesting right atrial pressure of 8 mmHg. 14. TR signal is inadequate for assessing pulmonary artery systolic pressure.  Catheterization 11/8  Total occlusion of the mid LAD treated with angioplasty and stenting using a 4.0 x 22 Onyx deployed at 14 atm.  0% stenosis was noted post procedure and TIMI grade III flow increased from 0 to 3.  The final angiographic images of the LAD demonstrates apical occlusion due to embolus.  Luminal irregularities in the left main but no agreeable stenosis.  Large circumflex with 3 obtuse marginals.  The first marginal is large and has a very distal 90% stenosis in 2 branches.  There is a trifurcation distally in the first marginal.  Moderate diffuse luminal irregularities but widely patent.  Mid anterior wall to apical akinesis.  EF 30 to 35% acutely.  LVEDP 31 mmHg.  RECOMMENDATIONS:   High risk for developing acute kidney injury on top of chronic kidney disease stage IV.  4 hours of hydration of 50 cc/h is ordered.  Monitor kidney function daily  We will need nephrology consultation.  At high risk for developing hypoxia and pulmonary congestion.  If fever, IV Lasix should be instituted.  IV nitroglycerin between 20 and 60 mcg/min for a  total of 24 hours at which time the patient should be switched to BiDil 37.5/25 mg 1 tablet twice daily and titrate up.  He is not a candidate for angiotensin receptor  blockade/ACE inhibitor therapy due to CKD.  Start carvedilol 3.125 mg p.o. twice daily in a.m.  High risk for complications. _____________   History of Present Illness     [name] is a 63yo with PMH CKD Stage III, uncontrolled hypertension, HLD, and tobacco use who presented four hours after awaking with chest pain.   Hospital Course     Consultants: none    EKG showed ST elevation V1-V6 and inferior leads. He was taken to cath lab with placement of LAD stent for total LAD occlusion. He also had distal LAD emboli in the apical region which could not be reached. Echo demonstrated EF of 25-30% with severely decreased function and hypertrophy and septal and apical hypokinesis. ECHO with contrast was done which ruled out apical thrombus.  Post-catheterization he was started on lipitor 80 mg, DAPT with asa and brilinta for one year, and bidil 20-37.5 mg qd. He continued to have persistent sinus tachycardia and coreg was titrated up to 41m bid with decrease in HR to the low 100s. He will be discharged with life vest and close follow-up with heart failure to possibly start ivabradine.  He had likely progression of his CKD with Cr 2.5 two years ago to 3.4 at admission. Creatinine remained stable throughout admission, and it was discussed that he will need to follow-up with nephrology.  _____________  Discharge Vitals Blood pressure 98/66, pulse 93, temperature 98.4 F (36.9 C), temperature source Oral, resp. rate 18, height 6' (1.829 m), weight 83.9 kg, SpO2 99 %.  Filed Weights   03/14/19 0500 03/14/19 1751 03/15/19 0556  Weight: 84.4 kg 84.5 kg 83.9 kg    Labs & Radiologic Studies    CBC Recent Labs    03/13/19 0243 03/14/19 0245  WBC 11.9* 11.5*  HGB 12.3* 11.0*  HCT 35.4* 32.8*  MCV 89.6 93.4  PLT 232 2937  Basic Metabolic Panel Recent Labs    03/14/19 0245 03/15/19 0718  NA 136 136  K 4.1 3.8  CL 106 106  CO2 22 19*  GLUCOSE 115* 115*  BUN 36* 41*    CREATININE 3.52* 3.46*  CALCIUM 9.9 10.1  MG 1.9  --    Liver Function Tests Recent Labs    03/14/19 0245  AST 209*  ALT 50*  ALKPHOS 70  BILITOT 1.3*  PROT 6.2*  ALBUMIN 3.0*   No results for input(s): LIPASE, AMYLASE in the last 72 hours. Cardiac Enzymes No results for input(s): CKTOTAL, CKMB, CKMBINDEX, TROPONINI in the last 72 hours. BNP Invalid input(s): POCBNP D-Dimer No results for input(s): DDIMER in the last 72 hours. Hemoglobin A1C Recent Labs    03/13/19 0243  HGBA1C 5.4   Fasting Lipid Panel No results for input(s): CHOL, HDL, LDLCALC, TRIG, CHOLHDL, LDLDIRECT in the last 72 hours. Thyroid Function Tests No results for input(s): TSH, T4TOTAL, T3FREE, THYROIDAB in the last 72 hours.  Invalid input(s): FREET3 _____________  Dg Chest 2 View  Result Date: 03/14/2019 CLINICAL DATA:  Onset chest pain 03/12/2019. Coronary artery disease. EXAM: CHEST - 2 VIEW COMPARISON:  Single-view of the chest 03/12/2019. FINDINGS: The lungs are clear. No pneumothorax or pleural fluid. Heart size is normal. No acute or focal bony abnormality. Thoracic spondylosis noted. IMPRESSION: No acute disease. Electronically Signed   By: TInge Rise  M.D.   On: 03/14/2019 06:02   Dg Chest Portable 1 View  Result Date: 03/12/2019 CLINICAL DATA:  Chest pain. EXAM: PORTABLE CHEST 1 VIEW COMPARISON:  Chest radiograph [id number] FINDINGS: Monitoring leads overlie the patient. Stable cardiac and mediastinal contours. Pulmonary vascular redistribution and mild interstitial opacities. No pleural effusion or pneumothorax. IMPRESSION: Bilateral interstitial opacities may represent mild edema or atypical infectious process. Electronically Signed   By: Lovey Newcomer M.D.   On: 03/12/2019 09:13   Disposition   Pt is being discharged home today in good condition.  Follow-up Plans & Appointments    Follow-up Information    Bensimhon, Shaune Pascal, MD Follow up.   Specialty: Cardiology Why: The office  will call you with a follow up appt.  Contact information: [address] [place] [address] [phone]          Discharge Instructions    Amb Referral to Cardiac Rehabilitation   Complete by: As directed    Diagnosis:  STEMI Coronary Stents     After initial evaluation and assessments completed: Virtual Based Care may be provided alone or in conjunction with Phase 2 Cardiac Rehab based on patient barriers.: Yes   Call MD for:  redness, tenderness, or signs of infection (pain, swelling, redness, odor or green/yellow discharge around incision site)   Complete by: As directed    Diet - low sodium heart healthy   Complete by: As directed    Discharge instructions   Complete by: As directed    Radial Site Care Refer to this sheet in the next few weeks. These instructions provide you with information on caring for yourself after your procedure. Your caregiver may also give you more specific instructions. Your treatment has been planned according to current medical practices, but problems sometimes occur. Call your caregiver if you have any problems or questions after your procedure. HOME CARE INSTRUCTIONS You may shower the day after the procedure.Remove the bandage (dressing) and gently wash the site with plain soap and water.Gently pat the site dry.  Do not apply powder or lotion to the site.  Do not submerge the affected site in water for 3 to 5 days.  Inspect the site at least twice daily.  Do not flex or bend the affected arm for 24 hours.  No lifting over 5 pounds (2.3 kg) for 5 days after your procedure.  Do not drive home if you are discharged the same day of the procedure. Have someone else drive you.  You may drive 24 hours after the procedure unless otherwise instructed by your caregiver.  What to expect: Any bruising will usually fade within 1 to 2 weeks.  Blood that collects in the tissue (hematoma) may be painful to the touch. It should usually decrease  in size and tenderness within 1 to 2 weeks.  SEEK IMMEDIATE MEDICAL CARE IF: You have unusual pain at the radial site.  You have redness, warmth, swelling, or pain at the radial site.  You have drainage (other than a small amount of blood on the dressing).  You have chills.  You have a fever or persistent symptoms for more than 72 hours.  You have a fever and your symptoms suddenly get worse.  Your arm becomes pale, cool, tingly, or numb.  You have heavy bleeding from the site. Hold pressure on the site.   PLEASE DO NOT MISS ANY DOSES OF YOUR BRILINTA!!!!! Also keep a log of you blood pressures and bring back to  your follow up appt. Please call the office with any questions.   Patients taking blood thinners should generally stay away from medicines like ibuprofen, Advil, Motrin, naproxen, and Aleve due to risk of stomach bleeding. You may take Tylenol as directed or talk to your primary doctor about alternatives.  For patients with congestive heart failure, we give them these special instructions:  1. Follow a low-salt diet and watch your fluid intake. In general, you should not be taking in more than 2 liters of fluid per day (no more than 8 glasses per day). Some patients are restricted to less than 1.5 liters of fluid per day (no more than 6 glasses per day). This includes sources of water in foods like soup, coffee, tea, milk, etc. 2. Weigh yourself on the same scale at same time of day and keep a log. 3. Call your doctor: (Anytime you feel any of the following symptoms)  - 3-4 pound weight gain in 1-2 days or 2 pounds overnight  - Shortness of breath, with or without a dry hacking cough  - Swelling in the hands, feet or stomach  - If you have to sleep on extra pillows at night in order to breathe   IT IS IMPORTANT TO LET YOUR DOCTOR KNOW EARLY ON IF YOU ARE HAVING SYMPTOMS SO WE CAN HELP YOU!   Increase activity slowly   Complete by: As directed       Discharge Medications    Allergies as of 03/15/2019   No Known Allergies     Medication List    TAKE these medications   aspirin 81 MG chewable tablet Chew 1 tablet (81 mg total) by mouth daily. Start taking on: March 16, 2019   atorvastatin 80 MG tablet Commonly known as: LIPITOR Take 1 tablet (80 mg total) by mouth daily at 6 PM.   carvedilol 25 MG tablet Commonly known as: COREG Take 1 tablet (25 mg total) by mouth 2 (two) times daily with a meal.   isosorbide-hydrALAZINE 20-37.5 MG tablet Commonly known as: BIDIL Take 1 tablet by mouth 2 (two) times daily.   nitroGLYCERIN 0.4 MG SL tablet Commonly known as: Nitrostat Place 1 tablet (0.4 mg total) under the tongue every 5 (five) minutes as needed.   ticagrelor 90 MG Tabs tablet Commonly known as: BRILINTA Take 1 tablet (90 mg total) by mouth 2 (two) times daily.        Aspirin prescribed at discharge?  Yes High Intensity Statin Prescribed? (Lipitor 40-37m or Crestor 20-448m: Yes Beta Blocker Prescribed? Yes For EF <40%, was ACEI/ARB Prescribed? No: CKD Stage III ADP Receptor Inhibitor Prescribed? (i.e. Plavix etc.-Includes Medically Managed Patients): Yes For EF <40%, Aldosterone Inhibitor Prescribed? No Was EF assessed during THIS hospitalization? Yes Was Cardiac Rehab II ordered? (Included Medically managed Patients): Yes   Outstanding Labs/Studies   none  Duration of Discharge Encounter   Greater than 30 minutes including physician time.  Signed, SeMarty HeckDO 03/15/2019, 7:24 PM Pager: [phone]

## 2019-03-21 ENCOUNTER — Encounter: Payer: Self-pay | Admitting: Family Medicine

## 2019-03-21 ENCOUNTER — Other Ambulatory Visit: Payer: Self-pay

## 2019-03-21 ENCOUNTER — Ambulatory Visit: Payer: Medicaid Other | Admitting: Family Medicine

## 2019-03-21 VITALS — BP 112/76 | HR 96 | Temp 98.3°F | Ht 72.0 in | Wt 186.4 lb

## 2019-03-21 DIAGNOSIS — I2102 ST elevation (STEMI) myocardial infarction involving left anterior descending coronary artery: Secondary | ICD-10-CM | POA: Diagnosis not present

## 2019-03-21 DIAGNOSIS — I10 Essential (primary) hypertension: Secondary | ICD-10-CM | POA: Diagnosis not present

## 2019-03-21 DIAGNOSIS — N189 Chronic kidney disease, unspecified: Secondary | ICD-10-CM | POA: Diagnosis not present

## 2019-03-21 DIAGNOSIS — I509 Heart failure, unspecified: Secondary | ICD-10-CM

## 2019-03-21 DIAGNOSIS — E785 Hyperlipidemia, unspecified: Secondary | ICD-10-CM | POA: Diagnosis not present

## 2019-03-21 NOTE — Progress Notes (Signed)
New Patient Office Visit  Subjective:  Patient ID: [name], male    DOB: 15-Jul-1955  Age: 63 y.o. MRN: [id number]  CC:  Chief Complaint  Patient presents with  . Establish Care  . Code STEMI    last Sunday Morning  Discharged summary:03/13/19-03/16/19 EKG showed ST elevation V1-V6 and inferior leads. He was taken to cath lab with placement of LAD stent for total LAD occlusion. He also had distal LAD emboli in the apical region which could not be reached. Echo demonstrated EF of 25-30% with severely decreased function and hypertrophy and septal and apical hypokinesis. ECHO with contrast was done which ruled out apical thrombus.  Post-catheterization he was started on lipitor 80 mg, DAPT with asa and brilinta for one year, and bidil 20-37.5 mg qd. He continued to have persistent sinus tachycardia and coreg was titrated up to 25mg  bid with decrease in HR to the low 100s. He will be discharged with life vest and close follow-up with heart failure to possibly start ivabradine.  He had likely progression of his CKD with Cr 2.5 two years ago to 3.4 at admission. Creatinine remained stable throughout admission, and it was discussed that he will need to follow-up with nephrology.  HPI [name] presents for follow up from  MI last Sunday-discharged on Thursday. Pt states feeling fatigue. Pt states taking all medication as directed. Pt does not like wearing the life vest.-off at appointment. Admits previously bp poorly controlled-now taking all medications given at discharge. No CP . No use of nitro since discharge, Forearmskin tear during hospitalization. No redness or pain  Past Medical History:  Diagnosis Date  . Heart attack ([place]) 03/15/2019  . Hypertension 2002  . Kidney stones   . Left knee pain     Past Surgical History:  Procedure Laterality Date  . AMPUTATION TOE Left 1991   2 digit of left foot  . CHOLECYSTECTOMY    . CORONARY/GRAFT ACUTE MI REVASCULARIZATION N/A  03/12/2019   Procedure: Coronary/Graft Acute MI Revascularization;  Surgeon: Belva Crome, MD;  Location: [facility];  Service: Cardiovascular;  Laterality: N/A;  . KIDNEY STONE SURGERY Right   . LEFT HEART CATH AND CORONARY ANGIOGRAPHY N/A 03/12/2019   Procedure: LEFT HEART CATH AND CORONARY ANGIOGRAPHY;  Surgeon: Belva Crome, MD;  Location: [facility];  Service: Cardiovascular;  Laterality: N/A;    Family History  Problem Relation Age of Onset  . Heart failure Mother   . Heart disease Mother   . Cancer Mother        breast  . Heart attack Father   . Hypertension Father   . Heart disease Father     Social History   Socioeconomic History  . Marital status: Single    Spouse name: Not on file  . Number of children: Not on file  . Years of education: Not on file  . Highest education level: Not on file  Occupational History  . Occupation: retired  Scientific laboratory technician  . Financial resource strain: Not on file  . Food insecurity    Worry: Not on file    Inability: Not on file  . Transportation needs    Medical: Not on file    Non-medical: Not on file  Tobacco Use  . Smoking status: Current Some Day Smoker    Packs/day: 0.50    Years: 40.00    Pack years: 20.00    Types: Cigarettes    Last attempt to quit:  05/25/2016    Years since quitting: 2.8  . Smokeless tobacco: Never Used  Substance and Sexual Activity  . Alcohol use: No  . Drug use: No  . Sexual activity: Not on file  Lifestyle  . Physical activity    Days per week: Not on file    Minutes per session: Not on file  . Stress: Not on file  Relationships  . Social Herbalist on phone: Not on file    Gets together: Not on file    Attends religious service: Not on file    Active member of club or organization: Not on file    Attends meetings of clubs or organizations: Not on file    Relationship status: Not on file  . Intimate partner violence    Fear of current or ex partner: Not on file     Emotionally abused: Not on file    Physically abused: Not on file    Forced sexual activity: Not on file  Other Topics Concern  . Not on file  Social History Narrative  . Not on file    ROS Review of Systems  Constitutional: Positive for fatigue.  Eyes: Negative for visual disturbance.  Respiratory: Negative.   Cardiovascular: Negative for chest pain, palpitations and leg swelling.  Gastrointestinal: Negative.   Skin:       Right forearm skin tear  Neurological: Negative for dizziness, syncope and headaches.  Psychiatric/Behavioral: Negative.     Objective:   Today's Vitals: BP 112/76 (BP Location: Left Arm, Patient Position: Sitting, Cuff Size: Normal)   Pulse 96   Temp 98.3 F (36.8 C) (Oral)   Ht 6' (1.829 m)   Wt 186 lb 6.4 oz (84.6 kg)   SpO2 97%   BMI 25.28 kg/m   Physical Exam Constitutional:      General: He is not in acute distress.    Appearance: Normal appearance. He is not ill-appearing.  HENT:     Head: Normocephalic.  Eyes:     Conjunctiva/sclera: Conjunctivae normal.  Neck:     Musculoskeletal: Normal range of motion.  Cardiovascular:     Rate and Rhythm: Normal rate and regular rhythm.     Pulses: Normal pulses.     Heart sounds: Normal heart sounds.  Pulmonary:     Effort: Pulmonary effort is normal.     Breath sounds: Normal breath sounds.  Skin:    Comments: Right forearm-skin tear-antibiotic ointment-duoderm-d/w pt skin care for healing  Neurological:     Mental Status: He is alert and oriented to person, place, and time.  Psychiatric:        Mood and Affect: Mood normal.        Behavior: Behavior normal.     Assessment & Plan:    Outpatient Encounter Medications as of 03/21/2019  Medication Sig  . aspirin 81 MG chewable tablet Chew 1 tablet (81 mg total) by mouth daily.  Marland Kitchen atorvastatin (LIPITOR) 80 MG tablet Take 1 tablet (80 mg total) by mouth daily at 6 PM.  . carvedilol (COREG) 25 MG tablet Take 1 tablet (25 mg total) by  mouth 2 (two) times daily with a meal.  . isosorbide-hydrALAZINE (BIDIL) 20-37.5 MG tablet Take 1 tablet by mouth 2 (two) times daily.  . nitroGLYCERIN (NITROSTAT) 0.4 MG SL tablet Place 1 tablet (0.4 mg total) under the tongue every 5 (five) minutes as needed.  . ticagrelor (BRILINTA) 90 MG TABS tablet Take 1 tablet (90 mg total) by  mouth 2 (two) times daily.   No facility-administered encounter medications on file as of 03/21/2019.   1. Chronic kidney disease, unspecified CKD stage - Ambulatory referral to Nephrology - COMPLETE METABOLIC PANEL WITH GFR - Urinalysis Worsening renal function on admission-poorly controlled HTN in the past 2. Hyperlipidemia, unspecified hyperlipidemia type Atorvastatin-new medication start while hospitalized-no leg pain Isosorbide/hydralazine-understands need for medication and importance of bp control, cholesterol management  3. Essential hypertension carvedilol  4. ST elevation myocardial infarction involving left anterior descending (LAD) coronary artery (HCC) brilinta No use of nitroglycerin since MI with stent placed last week   5. Other congestive heart failure (HCC) EF-25-30%-no LE edema, no SOB, fatigue noted  Follow-up:  Sees cardio Thurs for follow up-[place] if released by cardiology Wound care if not healing well on forearm LISA Hannah Beat, MD

## 2019-03-21 NOTE — Patient Instructions (Addendum)
Blood work-non fasting Wound care-non stick bandage-self stick wrap-right arm If released by cardiology for influenza vaccine, come to office on Thursday for nurse visit for flu shot  Food Basics for Chronic Kidney Disease When your kidneys are not working well, they cannot remove waste and excess substances from your blood as effectively as they did before. This can lead to a buildup and imbalance of these substances, which can worsen kidney damage and affect how your body functions. Certain foods lead to a buildup of these substances in the body. By changing your diet as recommended by your diet and nutrition specialist (dietitian) or health care provider, you could help prevent further kidney damage and delay or prevent the need for dialysis. What are tips for following this plan? General instructions   Work with your health care provider and dietitian to develop a meal plan that is right for you. Foods you can eat, limit, or avoid will be different for each person depending on the stage of kidney disease and any other existing health conditions.  Talk with your health care provider about whether you should take a vitamin and mineral supplement.  Use standard measuring cups and spoons to measure servings of foods. Use a kitchen scale to measure portions of protein foods.  If directed by your health care provider, avoid drinking too much fluid. Measure and count all liquids, including water, ice, soups, flavored gelatin, and frozen desserts such as popsicles or ice cream. Reading food labels  Check the amount of sodium in foods. Choose foods that have less than 300 milligrams (mg) per serving.  Check the ingredient list for phosphorus or potassium-based additives or preservatives.  Check the amount of saturated and trans fat. Limit or avoid these fats as told by your dietitian. Shopping  Avoid buying foods that are: ? Processed, frozen, or prepackaged. ? Calcium-enriched or fortified.   Do not buy foods that have salt or sodium listed among the first five ingredients.  Do not buy canned vegetables. Cooking  Replace animal proteins, such as meat, fish, eggs, or dairy, with plant proteins from beans, nuts, and soy. ? Use soy milk instead of cow's milk. ? Add beans or tofu to soups, casseroles, or pasta dishes instead of meat.  Soak vegetables, such as potatoes, before cooking to reduce potassium. To do this: ? Peel and cut into small pieces. ? Soak in warm water for at least 2 hours. For every 1 cup of vegetables, use 10 cups of water. ? Drain and rinse with warm water. ? Boil for at least 5 minutes. Meal planning  Limit the amount of protein from plant and animal sources you eat each day.  Do not add salt to food when cooking or before eating.  Eat meals and snacks at around the same time each day. If you have diabetes:  If you have diabetes (diabetes mellitus) and chronic kidney disease, it is important to keep your blood glucose in the target range recommended by your health care provider. Follow your diabetes management plan. This may include: ? Checking your blood glucose regularly. ? Taking oral medicines, insulin, or both. ? Exercising for at least 30 minutes on 5 or more days each week, or as told by your health care provider. ? Tracking how many servings of carbohydrates you eat at each meal.  You may be given specific guidelines on how much of certain foods and nutrients you may eat, depending on your stage of kidney disease and whether you have high  blood pressure (hypertension). Follow your meal plan as told by your dietitian. What nutrients should be limited? The items listed are not a complete list. Talk with your dietitian about what dietary choices are best for you. Potassium Potassium affects how steadily your heart beats. If too much potassium builds up in your blood, it can cause an irregular heartbeat or even a heart attack. You may need to eat  less potassium, depending on your blood potassium levels and the stage of kidney disease. Talk to your dietitian about how much potassium you may have each day. You may need to limit or avoid foods that are high in potassium, such as:  Milk and soy milk.  Fruits, such as bananas, papaya, apricots, nectarines, melon, prunes, raisins, kiwi, and oranges.  Vegetables, such as potatoes, sweet potatoes, yams, tomatoes, leafy greens, beets, okra, avocado, pumpkin, and winter squash.  White and lima beans. Phosphorus Phosphorus is a mineral found in your bones. A balance between calcium and phosphorous is needed to build and maintain healthy bones. Too much phosphorus pulls calcium from your bones. This can make your bones weak and more likely to break. Too much phosphorus can also make your skin itch. You may need to eat less phosphorus depending on your blood phosphorus levels and the stage of kidney disease. Talk to your dietitian about how much potassium you may have each day. You may need to take medicine to lower your blood phosphorus levels if diet changes do not help. You may need to limit or avoid foods that are high in phosphorus, such as:  Milk and dairy products.  Dried beans and peas.  Tofu, soy milk, and other soy-based meat replacements.  Colas.  Nuts and peanut butter.  Meat, poultry, and fish.  Bran cereals and oatmeals. Protein Protein helps you to make and keep muscle. It also helps in the repair of your body's cells and tissues. One of the natural breakdown products of protein is a waste product called urea. When your kidneys are not working properly, they cannot remove wastes, such as urea, like they did before you developed chronic kidney disease. Reducing how much protein you eat can help prevent a buildup of urea in your blood. Depending on your stage of kidney disease, you may need to limit foods that are high in protein. Sources of animal protein include:  Meat (all  types).  Fish and seafood.  Poultry.  Eggs.  Dairy. Other protein foods include:  Beans and legumes.  Nuts and nut butter.  Soy and tofu. Sodium Sodium, which is found in salt, helps maintain a healthy balance of fluids in your body. Too much sodium can increase your blood pressure and have a negative effect on the function of your heart and lungs. Too much sodium can also cause your body to retain too much fluid, making your kidneys work harder. Most people should have less than 2,300 milligrams (mg) of sodium each day. If you have hypertension, you may need to limit your sodium to 1,500 mg each day. Talk to your dietitian about how much sodium you may have each day. You may need to limit or avoid foods that are high in sodium, such as:  Salt seasonings.  Soy sauce.  Cured and processed meats.  Salted crackers and snack foods.  Fast food.  Canned soups and most canned foods.  Pickled foods.  Vegetable juice.  Boxed mixes or ready-to-eat boxed meals and side dishes.  Bottled dressings, sauces, and marinades. Summary  Chronic kidney disease can lead to a buildup and imbalance of waste and excess substances in the body. Certain foods lead to a buildup of these substances. By adjusting your intake of these foods, you could help prevent more kidney damage and delay or prevent the need for dialysis.  Food adjustments are different for each person with chronic kidney disease. Work with a dietitian to set up nutrient goals and a meal plan that is right for you.  If you have diabetes and chronic kidney disease, it is important to keep your blood glucose in the target range recommended by your health care provider. This information is not intended to replace advice given to you by your health care provider. Make sure you discuss any questions you have with your health care provider. Document Released: 07/11/2002 Document Revised: 08/11/2018 Document Reviewed: 04/15/2016  Elsevier Patient Education  2020 [employer].

## 2019-03-22 DIAGNOSIS — I5021 Acute systolic (congestive) heart failure: Secondary | ICD-10-CM

## 2019-03-22 LAB — URINALYSIS
Bilirubin Urine: NEGATIVE
Glucose, UA: NEGATIVE
Ketones, ur: NEGATIVE
Nitrite: NEGATIVE
Specific Gravity, Urine: 1.015 (ref 1.001–1.03)
pH: 5.5 (ref 5.0–8.0)

## 2019-03-23 ENCOUNTER — Other Ambulatory Visit (HOSPITAL_COMMUNITY): Payer: Self-pay | Admitting: Nephrology

## 2019-03-23 ENCOUNTER — Inpatient Hospital Stay (HOSPITAL_COMMUNITY): Admission: RE | Admit: 2019-03-23 | Payer: Medicaid Other | Source: Ambulatory Visit | Admitting: Internal Medicine

## 2019-03-23 ENCOUNTER — Other Ambulatory Visit: Payer: Self-pay | Admitting: Nephrology

## 2019-03-23 DIAGNOSIS — N184 Chronic kidney disease, stage 4 (severe): Secondary | ICD-10-CM

## 2019-04-04 ENCOUNTER — Observation Stay (HOSPITAL_COMMUNITY)
Admission: EM | Admit: 2019-04-04 | Discharge: 2019-04-05 | Disposition: A | Discharge status: Home / Self Care | Payer: Medicaid Other | Attending: Internal Medicine | Admitting: Internal Medicine

## 2019-04-04 ENCOUNTER — Other Ambulatory Visit: Payer: Self-pay

## 2019-04-04 ENCOUNTER — Encounter (HOSPITAL_COMMUNITY): Payer: Self-pay

## 2019-04-04 ENCOUNTER — Emergency Department (HOSPITAL_COMMUNITY): Payer: Medicaid Other

## 2019-04-04 DIAGNOSIS — E785 Hyperlipidemia, unspecified: Secondary | ICD-10-CM | POA: Insufficient documentation

## 2019-04-04 DIAGNOSIS — Z7902 Long term (current) use of antithrombotics/antiplatelets: Secondary | ICD-10-CM | POA: Diagnosis not present

## 2019-04-04 DIAGNOSIS — I2510 Atherosclerotic heart disease of native coronary artery without angina pectoris: Secondary | ICD-10-CM | POA: Diagnosis not present

## 2019-04-04 DIAGNOSIS — Z7982 Long term (current) use of aspirin: Secondary | ICD-10-CM | POA: Diagnosis not present

## 2019-04-04 DIAGNOSIS — F17210 Nicotine dependence, cigarettes, uncomplicated: Secondary | ICD-10-CM | POA: Diagnosis not present

## 2019-04-04 DIAGNOSIS — I130 Hypertensive heart and chronic kidney disease with heart failure and stage 1 through stage 4 chronic kidney disease, or unspecified chronic kidney disease: Secondary | ICD-10-CM | POA: Diagnosis not present

## 2019-04-04 DIAGNOSIS — Z89422 Acquired absence of other left toe(s): Secondary | ICD-10-CM | POA: Diagnosis not present

## 2019-04-04 DIAGNOSIS — Z955 Presence of coronary angioplasty implant and graft: Secondary | ICD-10-CM | POA: Insufficient documentation

## 2019-04-04 DIAGNOSIS — I5021 Acute systolic (congestive) heart failure: Secondary | ICD-10-CM | POA: Diagnosis not present

## 2019-04-04 DIAGNOSIS — N189 Chronic kidney disease, unspecified: Secondary | ICD-10-CM | POA: Insufficient documentation

## 2019-04-04 DIAGNOSIS — R55 Syncope and collapse: Secondary | ICD-10-CM | POA: Diagnosis not present

## 2019-04-04 DIAGNOSIS — I252 Old myocardial infarction: Secondary | ICD-10-CM | POA: Insufficient documentation

## 2019-04-04 DIAGNOSIS — Z20828 Contact with and (suspected) exposure to other viral communicable diseases: Secondary | ICD-10-CM | POA: Insufficient documentation

## 2019-04-04 DIAGNOSIS — Z79899 Other long term (current) drug therapy: Secondary | ICD-10-CM | POA: Diagnosis not present

## 2019-04-04 HISTORY — DX: Syncope and collapse: R55

## 2019-04-04 LAB — BASIC METABOLIC PANEL
Anion gap: 8 (ref 5–15)
BUN: 32 mg/dL — ABNORMAL HIGH (ref 8–23)
CO2: 18 mmol/L — ABNORMAL LOW (ref 22–32)
Calcium: 10.2 mg/dL (ref 8.9–10.3)
Chloride: 114 mmol/L — ABNORMAL HIGH (ref 98–111)
Creatinine, Ser: 3.13 mg/dL — ABNORMAL HIGH (ref 0.61–1.24)
GFR calc Af Amer: 24 mL/min — ABNORMAL LOW (ref >60)
GFR calc non Af Amer: 20 mL/min — ABNORMAL LOW (ref >60)
Glucose, Bld: 110 mg/dL — ABNORMAL HIGH (ref 70–99)
Potassium: 5.2 mmol/L — ABNORMAL HIGH (ref 3.5–5.1)
Sodium: 140 mmol/L (ref 135–145)

## 2019-04-04 LAB — TROPONIN I (HIGH SENSITIVITY)
Troponin I (High Sensitivity): 600 ng/L (ref <18)
Troponin I (High Sensitivity): 592 ng/L (ref <18)

## 2019-04-04 LAB — MAGNESIUM: Magnesium: 2.1 mg/dL (ref 1.7–2.4)

## 2019-04-04 LAB — CBC WITH DIFFERENTIAL/PLATELET
Abs Immature Granulocytes: 0.02 10*3/uL (ref 0.00–0.07)
Basophils Absolute: 0 10*3/uL (ref 0.0–0.1)
Basophils Relative: 1 %
Eosinophils Absolute: 0.2 10*3/uL (ref 0.0–0.5)
Eosinophils Relative: 3 %
HCT: 35.5 % — ABNORMAL LOW (ref 39.0–52.0)
Hemoglobin: 11.3 g/dL — ABNORMAL LOW (ref 13.0–17.0)
Immature Granulocytes: 0 %
Lymphocytes Relative: 23 %
Lymphs Abs: 1.4 10*3/uL (ref 0.7–4.0)
MCH: 30.7 pg (ref 26.0–34.0)
MCHC: 31.8 g/dL (ref 30.0–36.0)
MCV: 96.5 fL (ref 80.0–100.0)
Monocytes Absolute: 0.4 10*3/uL (ref 0.1–1.0)
Monocytes Relative: 7 %
Neutro Abs: 4 10*3/uL (ref 1.7–7.7)
Neutrophils Relative %: 66 %
Platelets: 218 10*3/uL (ref 150–400)
RBC: 3.68 MIL/uL — ABNORMAL LOW (ref 4.22–5.81)
RDW: 14.1 % (ref 11.5–15.5)
WBC: 6 10*3/uL (ref 4.0–10.5)
nRBC: 0 % (ref 0.0–0.2)

## 2019-04-04 LAB — SARS CORONAVIRUS 2 (TAT 6-24 HRS): SARS Coronavirus 2: NEGATIVE

## 2019-04-04 LAB — CBG MONITORING, ED: Glucose-Capillary: 115 mg/dL — ABNORMAL HIGH (ref 70–99)

## 2019-04-04 MED ORDER — ATORVASTATIN CALCIUM 80 MG PO TABS: 80.0000 mg | ORAL_TABLET | Freq: Every day | ORAL | Status: DC

## 2019-04-04 MED ORDER — ACETAMINOPHEN 325 MG PO TABS: 650.0000 mg | ORAL_TABLET | Freq: Four times a day (QID) | ORAL | Status: DC | PRN

## 2019-04-04 MED ORDER — ACETAMINOPHEN 650 MG RE SUPP: 650.0000 mg | Freq: Four times a day (QID) | RECTAL | Status: DC | PRN

## 2019-04-04 MED ORDER — ASPIRIN 81 MG PO CHEW
81.0000 mg | CHEWABLE_TABLET | Freq: Every day | ORAL | Status: DC
  Administered 2019-04-05: 81 mg via ORAL
  Filled 2019-04-04: qty 1

## 2019-04-04 MED ORDER — TICAGRELOR 90 MG PO TABS
90.0000 mg | ORAL_TABLET | Freq: Two times a day (BID) | ORAL | Status: DC
  Administered 2019-04-04 – 2019-04-05 (×2): 90 mg via ORAL
  Filled 2019-04-04 (×2): qty 1

## 2019-04-04 MED ORDER — ENOXAPARIN SODIUM 40 MG/0.4ML ~~LOC~~ SOLN
40.0000 mg | SUBCUTANEOUS | Status: DC
  Administered 2019-04-04: 40 mg via SUBCUTANEOUS
  Filled 2019-04-04: qty 0.4

## 2019-04-04 NOTE — ED Notes (Signed)
Visitor at bedside.

## 2019-04-04 NOTE — ED Notes (Signed)
Date and time results received: 04/04/19  (use smartphrase ".now" to insert current time)  Test: troponin Critical Value: 600 ng/l  Name of Provider Notified: Regenia Skeeter

## 2019-04-04 NOTE — ED Triage Notes (Signed)
Pt to ED via EMS, apparently pt was at restaurant this morning for breakfast and had a syncopal episode, Per EMS when arrived pt was lethargic and diaphoretic. pt recently d/c from hospital d/t heart attack and stent was placed. Currently denies chest pain. 324mg  aspirin and 1 nitro given by EMS, 200 ML NS fluid bolus given by EMS. Last VS: 101/72, HR 66. o2 97% ON 3L, not home o2 user. CBG 122 .

## 2019-04-04 NOTE — ED Notes (Signed)
Pt CBG 115

## 2019-04-04 NOTE — H&P (Signed)
Date: 04/04/2019               Patient Name:  [name] MRN: [id number]  DOB: 1955/10/23 Age / Sex: 63 y.o., male   PCP: Maryruth Hancock, MD         Medical Service: Internal Medicine Teaching Service         Attending Physician: Dr. Bartholomew Crews, MD    First Contact: Dr. Madilyn Fireman Pager: [phone]  Second Contact: Dr. Truman Hayward Pager: [phone]       After Hours (After 5p/  First Contact Pager: [phone]  weekends / holidays): Second Contact Pager: [phone]   Chief Complaint: syncope  History of Present Illness: [name] is a pleasant 63 year old gentleman with a past medical history of recent ST elevation myocardial infarction, hypertension, hyperlipidemia, chronic kidney disease, congestive heart failure who presents after an episode of syncope today after getting his blood drawn.  The patient reports he has been feeling well since his STEMI without chest pain.  He does report some mild shortness of breath since that time.  He was getting blood drawn this morning that he thought required fasting and was stuck so many times without success that they had to get a second phlebotomist.  He does remember having pain with these blood draws.  He said they took 11 bottles and he had stated at the time that he needed to go eat something and the phlebotomist encouraged him to do so as well.  He went downstairs and into a restaurant at which point he felt warm and diaphoretic and began to feel lightheaded.  He sat down in a chair and passed out shortly after for a few minutes.  This was witnessed and he does not report that he fell out of the chair or hit his head.  No one reports seizure-like activity.  He denies chest pain or shortness of breath prior to the episode.  He says he has never had an episode like this before.  He has never syncopized before.  Otherwise denies fevers, chills, sore throat, cough, abdominal pain, nausea and vomiting.  He does report some very mild 1.5 on the 10 chest  discomfort since lying in the uncomfortable emergency room bed for the last 6 hours.    Meds:  Current Meds  Medication Sig   aspirin 81 MG chewable tablet Chew 1 tablet (81 mg total) by mouth daily.   atorvastatin (LIPITOR) 80 MG tablet Take 1 tablet (80 mg total) by mouth daily at 6 PM.   carvedilol (COREG) 25 MG tablet Take 1 tablet (25 mg total) by mouth 2 (two) times daily with a meal.   isosorbide-hydrALAZINE (BIDIL) 20-37.5 MG tablet Take 1 tablet by mouth 2 (two) times daily.   nitroGLYCERIN (NITROSTAT) 0.4 MG SL tablet Place 1 tablet (0.4 mg total) under the tongue every 5 (five) minutes as needed.   ticagrelor (BRILINTA) 90 MG TABS tablet Take 1 tablet (90 mg total) by mouth 2 (two) times daily.     Allergies: Allergies as of 04/04/2019   (No Known Allergies)   Past Medical History:  Diagnosis Date   Heart attack ([place]) 03/15/2019   Hypertension 2002   Kidney stones    Left knee pain     Family History:   Family History  Problem Relation Age of Onset   Heart failure Mother    Heart disease Mother    Cancer Mother        breast  Heart attack Father    Hypertension Father    Heart disease Father    No family history of abnormal heart rhythms  Social History:   Social History   Socioeconomic History   Marital status: Single    Spouse name: Not on file   Number of children: Not on file   Years of education: Not on file   Highest education level: Not on file  Occupational History   Occupation: retired  Scientist, product/process development strain: Not on file   Food insecurity    Worry: Not on file    Inability: Not on Lexicographer needs    Medical: Not on file    Non-medical: Not on file  Tobacco Use   Smoking status: Current Some Day Smoker    Packs/day: 0.50    Years: 40.00    Pack years: 20.00    Types: Cigarettes    Last attempt to quit: 05/25/2016    Years since quitting: 2.8   Smokeless tobacco: Never Used    Substance and Sexual Activity   Alcohol use: No   Drug use: No   Sexual activity: Not on file  Lifestyle   Physical activity    Days per week: Not on file    Minutes per session: Not on file   Stress: Not on file  Relationships   Social connections    Talks on phone: Not on file    Gets together: Not on file    Attends religious service: Not on file    Active member of club or organization: Not on file    Attends meetings of clubs or organizations: Not on file    Relationship status: Not on file   Intimate partner violence    Fear of current or ex partner: Not on file    Emotionally abused: Not on file    Physically abused: Not on file    Forced sexual activity: Not on file  Other Topics Concern   Not on file  Social History Narrative   Not on file   Review of Systems: A complete ROS was negative except as per HPI.   Physical Exam: Blood pressure 117/87, pulse 86, temperature 98.6 F (37 C), temperature source Oral, resp. rate 16, height 6' (1.829 m), weight 84.8 kg, SpO2 100 %.  Physical Exam  Constitutional: He is well-developed, well-nourished, and in no distress. No distress.  HENT:  Head: Normocephalic and atraumatic.  Cardiovascular: Normal rate, regular rhythm, normal heart sounds and intact distal pulses.  No murmur heard. No lower extremity edema  Pulmonary/Chest: Effort normal and breath sounds normal. No respiratory distress.  Abdominal: Soft. Bowel sounds are normal. He exhibits no distension.  Musculoskeletal: Normal range of motion.        General: No edema.  Neurological: He is alert.  Skin: Skin is warm and dry. He is not diaphoretic.  Psychiatric: Affect normal.  Nursing note and vitals reviewed.   Labs: Results for orders placed or performed during the hospital encounter of 04/04/19 (from the past 24 hour(s))  CBC WITH DIFFERENTIAL     Status: Abnormal   Collection Time: 04/04/19 11:05 AM  Result Value Ref Range   WBC 6.0 4.0 - 10.5  K/uL   RBC 3.68 (L) 4.22 - 5.81 MIL/uL   Hemoglobin 11.3 (L) 13.0 - 17.0 g/dL   HCT 35.5 (L) 39.0 - 52.0 %   MCV 96.5 80.0 - 100.0 fL   MCH 30.7 26.0 -  34.0 pg   MCHC 31.8 30.0 - 36.0 g/dL   RDW 14.1 11.5 - 15.5 %   Platelets 218 150 - 400 K/uL   nRBC 0.0 0.0 - 0.2 %   Neutrophils Relative % 66 %   Neutro Abs 4.0 1.7 - 7.7 K/uL   Lymphocytes Relative 23 %   Lymphs Abs 1.4 0.7 - 4.0 K/uL   Monocytes Relative 7 %   Monocytes Absolute 0.4 0.1 - 1.0 K/uL   Eosinophils Relative 3 %   Eosinophils Absolute 0.2 0.0 - 0.5 K/uL   Basophils Relative 1 %   Basophils Absolute 0.0 0.0 - 0.1 K/uL   Immature Granulocytes 0 %   Abs Immature Granulocytes 0.02 0.00 - 0.07 K/uL  CBG monitoring, ED     Status: Abnormal   Collection Time: 04/04/19 11:12 AM  Result Value Ref Range   Glucose-Capillary 115 (H) 70 - 99 mg/dL  Magnesium     Status: None   Collection Time: 04/04/19 12:40 PM  Result Value Ref Range   Magnesium 2.1 1.7 - 2.4 mg/dL  Troponin I (High Sensitivity)     Status: Abnormal   Collection Time: 04/04/19 12:40 PM  Result Value Ref Range   Troponin I (High Sensitivity) 600 (HH) <18 ng/L  Basic metabolic panel     Status: Abnormal   Collection Time: 04/04/19 12:40 PM  Result Value Ref Range   Sodium 140 135 - 145 mmol/L   Potassium 5.2 (H) 3.5 - 5.1 mmol/L   Chloride 114 (H) 98 - 111 mmol/L   CO2 18 (L) 22 - 32 mmol/L   Glucose, Bld 110 (H) 70 - 99 mg/dL   BUN 32 (H) 8 - 23 mg/dL   Creatinine, Ser 3.13 (H) 0.61 - 1.24 mg/dL   Calcium 10.2 8.9 - 10.3 mg/dL   GFR calc non Af Amer 20 (L) >60 mL/min   GFR calc Af Amer 24 (L) >60 mL/min   Anion gap 8 5 - 15  Troponin I (High Sensitivity)     Status: Abnormal   Collection Time: 04/04/19  3:00 PM  Result Value Ref Range   Troponin I (High Sensitivity) 592 (HH) <18 ng/L    EKG: personally reviewed my interpretation is sinus rhythm with no acute ischemic changes.  CXR: personally reviewed my interpretation is no acute  cardiopulmonary abnormality.  Assessment:  Mafi is a pleasant 62 year old gentleman with a past medical history of recent ST elevation myocardial infarction with total occlusion of the mid LAD treated with angioplasty and stenting, hypertension, hyperlipidemia, chronic kidney disease and congestive heart failure who presents after an episode of syncope today after getting his blood drawn with a negative ACS work-up whose presentation is consistent with vasovagal syncope.  Plan by Problem: Active Problems:   Syncope -patient history very consistent with vaso-vagal syncope -ACS workup unremarkable -EKG without ischemic changes and serial troponins continuing to downtrend since his STEMI -other major concerns include a new heart block but given location of patient's STEMI this is less likely -patient is HDS without concern for ventricular free wall rupture or VSD -no significant respiratory sx or crackles on exam concerning for a mitral valve chordae tendinae rupture, plus the location of the MI makes this less likely as well  -no aortic valve abnormalities noted on Echo at time of STEMI -no new abnormal rhythms noted on telemetry  Plan: -cardiac monitoring overnight -cards consulted who does not believe this to be cardiac in nature  -orthostatics  CAD: -continue  aspirin 81 mg daily -continue brilinta 90 mg daily  Hypertension: -holding antihypertensives, coreg and bidil, in setting of syncope, normal BP and due to possible heart block  Hyperlipidemia: -continue atorvastatin 80 mg daily  HFrEF: -holding antihypertensives, coreg and bidil, in setting of syncope, normal BP and due to possible heart block  CKD: -avoid nephrotoxic agents  Dispo: Admit patient to Observation with expected length of stay less than 2 midnights.  Signed: Al Decant, MD 04/04/2019, 6:45 PM  Pager: [phone]

## 2019-04-04 NOTE — ED Notes (Signed)
Lunch Tray Ordered @ 1412-per Caryl Pina, RN called by Levada Dy

## 2019-04-04 NOTE — ED Provider Notes (Signed)
[place] EMERGENCY DEPARTMENT Provider Note   CSN: [id number] Arrival date & time: 04/04/19  1047     History   Chief Complaint Chief Complaint  Patient presents with  . Chest Pain    HPI [name] is a 63 y.o. male.     HPI  63 year old male presents with syncope.  Patient was getting blood drawn today and did not eat beforehand.  Went to a restaurant to go eat after and while he was standing he felt acutely lightheaded and sat down and then passed out.  He states the time between feeling lightheaded and passing out was only a few seconds.  He denies feeling any palpitations but he was short of breath.  No chest pain.  No symptoms now.  No leg swelling.  Previous to today he has been feeling well since discharge for a STEMI.  Past Medical History:  Diagnosis Date  . Heart attack ([place]) 03/15/2019  . Hypertension 2002  . Kidney stones   . Left knee pain     Patient Active Problem List   Diagnosis Date Noted  . Congestive heart failure ([place]) 03/21/2019  . Acute ST elevation myocardial infarction (STEMI) due to occlusion of mid portion of left anterior descending (LAD) coronary artery ([place]) 03/12/2019  . ST elevation myocardial infarction involving left anterior descending (LAD) coronary artery ([place])   . Acute systolic heart failure ([place])   . Chronic kidney disease 06/11/2016  . Elevated PSA 06/11/2016  . Hyperlipidemia 06/11/2016  . Essential hypertension 05/28/2016    Past Surgical History:  Procedure Laterality Date  . AMPUTATION TOE Left 1991   2 digit of left foot  . CHOLECYSTECTOMY    . CORONARY/GRAFT ACUTE MI REVASCULARIZATION N/A 03/12/2019   Procedure: Coronary/Graft Acute MI Revascularization;  Surgeon: Belva Crome, MD;  Location: [facility];  Service: Cardiovascular;  Laterality: N/A;  . KIDNEY STONE SURGERY Right   . LEFT HEART CATH AND CORONARY ANGIOGRAPHY N/A 03/12/2019   Procedure: LEFT HEART CATH AND CORONARY  ANGIOGRAPHY;  Surgeon: Belva Crome, MD;  Location: [facility];  Service: Cardiovascular;  Laterality: N/A;        Home Medications    Prior to Admission medications   Medication Sig Start Date End Date Taking? Authorizing Provider  aspirin 81 MG chewable tablet Chew 1 tablet (81 mg total) by mouth daily. 03/16/19   Cheryln Manly, NP  atorvastatin (LIPITOR) 80 MG tablet Take 1 tablet (80 mg total) by mouth daily at 6 PM. 03/15/19   Cheryln Manly, NP  carvedilol (COREG) 25 MG tablet Take 1 tablet (25 mg total) by mouth 2 (two) times daily with a meal. 03/15/19   Cheryln Manly, NP  isosorbide-hydrALAZINE (BIDIL) 20-37.5 MG tablet Take 1 tablet by mouth 2 (two) times daily. 03/15/19   Cheryln Manly, NP  nitroGLYCERIN (NITROSTAT) 0.4 MG SL tablet Place 1 tablet (0.4 mg total) under the tongue every 5 (five) minutes as needed. 03/15/19   Cheryln Manly, NP  ticagrelor (BRILINTA) 90 MG TABS tablet Take 1 tablet (90 mg total) by mouth 2 (two) times daily. 03/15/19   Cheryln Manly, NP    Family History Family History  Problem Relation Age of Onset  . Heart failure Mother   . Heart disease Mother   . Cancer Mother        breast  . Heart attack Father   . Hypertension Father   . Heart  disease Father     Social History Social History   Tobacco Use  . Smoking status: Current Some Day Smoker    Packs/day: 0.50    Years: 40.00    Pack years: 20.00    Types: Cigarettes    Last attempt to quit: 05/25/2016    Years since quitting: 2.8  . Smokeless tobacco: Never Used  Substance Use Topics  . Alcohol use: No  . Drug use: No     Allergies   Patient has no known allergies.   Review of Systems Review of Systems  Constitutional: Negative for fever.  Respiratory: Positive for shortness of breath.   Cardiovascular: Negative for chest pain, palpitations and leg swelling.  Gastrointestinal: Negative for diarrhea and vomiting.  All other systems  reviewed and are negative.    Physical Exam Updated Vital Signs BP (!) 119/93   Pulse 87   Temp 97.8 F (36.6 C)   Resp 15   Ht 6' (1.829 m)   Wt 84.8 kg   SpO2 100%   BMI 25.36 kg/m   Physical Exam Vitals signs and nursing note reviewed.  Constitutional:      Appearance: He is well-developed.  HENT:     Head: Normocephalic and atraumatic.     Right Ear: External ear normal.     Left Ear: External ear normal.     Nose: Nose normal.  Eyes:     General:        Right eye: No discharge.        Left eye: No discharge.  Neck:     Musculoskeletal: Neck supple.  Cardiovascular:     Rate and Rhythm: Normal rate and regular rhythm.     Heart sounds: Normal heart sounds.  Pulmonary:     Effort: Pulmonary effort is normal.     Breath sounds: Normal breath sounds.  Abdominal:     Palpations: Abdomen is soft.     Tenderness: There is no abdominal tenderness.  Musculoskeletal:     Right lower leg: No edema.     Left lower leg: No edema.  Skin:    General: Skin is warm and dry.  Neurological:     Mental Status: He is alert.  Psychiatric:        Mood and Affect: Mood is not anxious.      ED Treatments / Results  Labs (all labs ordered are listed, but only abnormal results are displayed) Labs Reviewed  CBC WITH DIFFERENTIAL/PLATELET - Abnormal; Notable for the following components:      Result Value   RBC 3.68 (*)    Hemoglobin 11.3 (*)    HCT 35.5 (*)    All other components within normal limits  BASIC METABOLIC PANEL - Abnormal; Notable for the following components:   Potassium 5.2 (*)    Chloride 114 (*)    CO2 18 (*)    Glucose, Bld 110 (*)    BUN 32 (*)    Creatinine, Ser 3.13 (*)    GFR calc non Af Amer 20 (*)    GFR calc Af Amer 24 (*)    All other components within normal limits  CBG MONITORING, ED - Abnormal; Notable for the following components:   Glucose-Capillary 115 (*)    All other components within normal limits  TROPONIN I (HIGH SENSITIVITY)  - Abnormal; Notable for the following components:   Troponin I (High Sensitivity) 600 (*)    All other components within normal limits  SARS CORONAVIRUS  2 (TAT 6-24 HRS)  MAGNESIUM  TROPONIN I (HIGH SENSITIVITY)    EKG EKG Interpretation  Date/Time:  Tuesday April 04 2019 10:56:01 EST Ventricular Rate:  79 PR Interval:    QRS Duration: 93 QT Interval:  367 QTC Calculation: 421 R Axis:   -55 Text Interpretation: Sinus rhythm Inferior infarct, old Probable lateral infarct, age indeterminate Probable anteroseptal infarct, recent STEMI no longer present compared to Mar 13 2019 Confirmed by Sherwood Gambler [id number]) on 04/04/2019 11:02:35 AM   Radiology Dg Chest Portable 1 View  Result Date: 04/04/2019 CLINICAL DATA:  Syncope EXAM: PORTABLE CHEST 1 VIEW COMPARISON:  March 14, 2019 FINDINGS: Lungs are clear. There is cardiomegaly with pulmonary vascularity normal. No adenopathy. No bone lesions. IMPRESSION: Cardiomegaly.  Lungs clear.  No adenopathy. Electronically Signed   By: Lowella Grip III M.D.   On: 04/04/2019 11:28    Procedures Procedures (including critical care time)  Medications Ordered in ED Medications - No data to display   Initial Impression / Assessment and Plan / ED Course  I have reviewed the triage vital signs and the nursing notes.  Pertinent labs & imaging results that were available during my care of the patient were reviewed by me and considered in my medical decision making (see chart for details).        Patient's elevated troponin is significantly less than when he had a STEMI and he will need repeat to see if this is going up or down.  He is currently asymptomatic which is reassuring but with his congestive heart failure and EF of 20%, he will need admission for monitoring for ventricular arrhythmia as a cause of his syncope and further work-up.  I did discuss with Dr. Harrell Gave of cardiology, but given that it is not clear this was  definitively arrhythmia, asked for medical admission and consult them if needed.  Internal medicine teaching service will admit.  Final Clinical Impressions(s) / ED Diagnoses   Final diagnoses:  Syncope and collapse    ED Discharge Orders    None       Sherwood Gambler, MD 04/04/19 1458

## 2019-04-05 ENCOUNTER — Ambulatory Visit: Payer: Medicaid Other | Admitting: Family Medicine

## 2019-04-05 DIAGNOSIS — I2510 Atherosclerotic heart disease of native coronary artery without angina pectoris: Secondary | ICD-10-CM | POA: Diagnosis not present

## 2019-04-05 DIAGNOSIS — I252 Old myocardial infarction: Secondary | ICD-10-CM

## 2019-04-05 DIAGNOSIS — R55 Syncope and collapse: Secondary | ICD-10-CM

## 2019-04-05 DIAGNOSIS — I130 Hypertensive heart and chronic kidney disease with heart failure and stage 1 through stage 4 chronic kidney disease, or unspecified chronic kidney disease: Secondary | ICD-10-CM | POA: Diagnosis not present

## 2019-04-05 DIAGNOSIS — E785 Hyperlipidemia, unspecified: Secondary | ICD-10-CM

## 2019-04-05 DIAGNOSIS — Z79899 Other long term (current) drug therapy: Secondary | ICD-10-CM

## 2019-04-05 DIAGNOSIS — N189 Chronic kidney disease, unspecified: Secondary | ICD-10-CM

## 2019-04-05 DIAGNOSIS — I5020 Unspecified systolic (congestive) heart failure: Secondary | ICD-10-CM

## 2019-04-05 DIAGNOSIS — Z955 Presence of coronary angioplasty implant and graft: Secondary | ICD-10-CM

## 2019-04-05 DIAGNOSIS — Z7982 Long term (current) use of aspirin: Secondary | ICD-10-CM

## 2019-04-05 LAB — COMPREHENSIVE METABOLIC PANEL
ALT: 14 U/L (ref 0–44)
AST: 15 U/L (ref 15–41)
Albumin: 2.6 g/dL — ABNORMAL LOW (ref 3.5–5.0)
Alkaline Phosphatase: 85 U/L (ref 38–126)
Anion gap: 8 (ref 5–15)
BUN: 32 mg/dL — ABNORMAL HIGH (ref 8–23)
CO2: 19 mmol/L — ABNORMAL LOW (ref 22–32)
Calcium: 10.2 mg/dL (ref 8.9–10.3)
Chloride: 114 mmol/L — ABNORMAL HIGH (ref 98–111)
Creatinine, Ser: 3.09 mg/dL — ABNORMAL HIGH (ref 0.61–1.24)
GFR calc Af Amer: 24 mL/min — ABNORMAL LOW (ref >60)
GFR calc non Af Amer: 21 mL/min — ABNORMAL LOW (ref >60)
Glucose, Bld: 116 mg/dL — ABNORMAL HIGH (ref 70–99)
Potassium: 4.3 mmol/L (ref 3.5–5.1)
Sodium: 141 mmol/L (ref 135–145)
Total Bilirubin: 0.6 mg/dL (ref 0.3–1.2)
Total Protein: 6.5 g/dL (ref 6.5–8.1)

## 2019-04-05 LAB — CBC
HCT: 32 % — ABNORMAL LOW (ref 39.0–52.0)
Hemoglobin: 10.6 g/dL — ABNORMAL LOW (ref 13.0–17.0)
MCH: 30.7 pg (ref 26.0–34.0)
MCHC: 33.1 g/dL (ref 30.0–36.0)
MCV: 92.8 fL (ref 80.0–100.0)
Platelets: 220 10*3/uL (ref 150–400)
RBC: 3.45 MIL/uL — ABNORMAL LOW (ref 4.22–5.81)
RDW: 13.7 % (ref 11.5–15.5)
WBC: 6.2 10*3/uL (ref 4.0–10.5)
nRBC: 0 % (ref 0.0–0.2)

## 2019-04-05 LAB — HIV ANTIBODY (ROUTINE TESTING W REFLEX): HIV Screen 4th Generation wRfx: NONREACTIVE

## 2019-04-05 MED ORDER — ENOXAPARIN SODIUM 30 MG/0.3ML ~~LOC~~ SOLN: 30.0000 mg | SUBCUTANEOUS | Status: DC

## 2019-04-05 NOTE — Discharge Instructions (Signed)
You were admitted to the hospital after an episode of passing out. You were monitored overnight for any abnormal rhythms in your heart which were not seen. We think this was likely a reaction you had to your traumatic blood draw. Our cardiologists saw you and agreed that this did not seem to be related to your heart. You should follow up with your primary care doctor and cardiologist within a week.

## 2019-04-05 NOTE — Progress Notes (Signed)
Subjective: Patient feeling well this morning. Denies any additional syncope or lightheadedness. Denies CP and SOB.     Consults: cardiology  Objective:  Vital signs in last 24 hours: Vitals:   04/04/19 1736 04/04/19 2018 04/04/19 2355 04/05/19 0418  BP: 117/87 107/77 124/89 112/81  Pulse: 86 95    Resp: 16 19 15 13   Temp: 98.6 F (37 C) 98.2 F (36.8 C) 98.5 F (36.9 C) 97.7 F (36.5 C)  TempSrc: Oral Axillary Oral Oral  SpO2: 100% 96%  96%  Weight:      Height:        Physical Exam  Constitutional: He is oriented to person, place, and time. No distress.  Cardiovascular: Normal rate, regular rhythm, normal heart sounds and intact distal pulses.  No murmur heard. Pulmonary/Chest: Effort normal and breath sounds normal. No respiratory distress. He exhibits no tenderness.  Abdominal: Soft. Bowel sounds are normal. He exhibits no distension. There is no abdominal tenderness.  Musculoskeletal: Normal range of motion.  Neurological: He is alert and oriented to person, place, and time.  Skin: Skin is warm and dry. He is not diaphoretic. No erythema.  Psychiatric: Affect normal.  Nursing note and vitals reviewed.   I/Os:  Intake/Output Summary (Last 24 hours) at 04/05/2019 0801 Last data filed at 04/04/2019 1800 Gross per 24 hour  Intake 260 ml  Output -  Net 260 ml     Telemetry: no arrhythmias   Labs: Results for orders placed or performed during the hospital encounter of 04/04/19 (from the past 24 hour(s))  CBC WITH DIFFERENTIAL     Status: Abnormal   Collection Time: 04/04/19 11:05 AM  Result Value Ref Range   WBC 6.0 4.0 - 10.5 K/uL   RBC 3.68 (L) 4.22 - 5.81 MIL/uL   Hemoglobin 11.3 (L) 13.0 - 17.0 g/dL   HCT 35.5 (L) 39.0 - 52.0 %   MCV 96.5 80.0 - 100.0 fL   MCH 30.7 26.0 - 34.0 pg   MCHC 31.8 30.0 - 36.0 g/dL   RDW 14.1 11.5 - 15.5 %   Platelets 218 150 - 400 K/uL   nRBC 0.0 0.0 - 0.2 %   Neutrophils Relative % 66 %   Neutro Abs 4.0 1.7 - 7.7 K/uL    Lymphocytes Relative 23 %   Lymphs Abs 1.4 0.7 - 4.0 K/uL   Monocytes Relative 7 %   Monocytes Absolute 0.4 0.1 - 1.0 K/uL   Eosinophils Relative 3 %   Eosinophils Absolute 0.2 0.0 - 0.5 K/uL   Basophils Relative 1 %   Basophils Absolute 0.0 0.0 - 0.1 K/uL   Immature Granulocytes 0 %   Abs Immature Granulocytes 0.02 0.00 - 0.07 K/uL  CBG monitoring, ED     Status: Abnormal   Collection Time: 04/04/19 11:12 AM  Result Value Ref Range   Glucose-Capillary 115 (H) 70 - 99 mg/dL  Magnesium     Status: None   Collection Time: 04/04/19 12:40 PM  Result Value Ref Range   Magnesium 2.1 1.7 - 2.4 mg/dL  Troponin I (High Sensitivity)     Status: Abnormal   Collection Time: 04/04/19 12:40 PM  Result Value Ref Range   Troponin I (High Sensitivity) 600 (HH) <18 ng/L  Basic metabolic panel     Status: Abnormal   Collection Time: 04/04/19 12:40 PM  Result Value Ref Range   Sodium 140 135 - 145 mmol/L   Potassium 5.2 (H) 3.5 - 5.1 mmol/L   Chloride  114 (H) 98 - 111 mmol/L   CO2 18 (L) 22 - 32 mmol/L   Glucose, Bld 110 (H) 70 - 99 mg/dL   BUN 32 (H) 8 - 23 mg/dL   Creatinine, Ser 3.13 (H) 0.61 - 1.24 mg/dL   Calcium 10.2 8.9 - 10.3 mg/dL   GFR calc non Af Amer 20 (L) >60 mL/min   GFR calc Af Amer 24 (L) >60 mL/min   Anion gap 8 5 - 15  SARS CORONAVIRUS 2 (TAT 6-24 HRS) Nasopharyngeal Nasopharyngeal Swab     Status: None   Collection Time: 04/04/19  2:50 PM   Specimen: Nasopharyngeal Swab  Result Value Ref Range   SARS Coronavirus 2 NEGATIVE NEGATIVE  Troponin I (High Sensitivity)     Status: Abnormal   Collection Time: 04/04/19  3:00 PM  Result Value Ref Range   Troponin I (High Sensitivity) 592 (HH) <18 ng/L  Comprehensive metabolic panel     Status: Abnormal   Collection Time: 04/05/19  4:59 AM  Result Value Ref Range   Sodium 141 135 - 145 mmol/L   Potassium 4.3 3.5 - 5.1 mmol/L   Chloride 114 (H) 98 - 111 mmol/L   CO2 19 (L) 22 - 32 mmol/L   Glucose, Bld 116 (H) 70 - 99 mg/dL    BUN 32 (H) 8 - 23 mg/dL   Creatinine, Ser 3.09 (H) 0.61 - 1.24 mg/dL   Calcium 10.2 8.9 - 10.3 mg/dL   Total Protein 6.5 6.5 - 8.1 g/dL   Albumin 2.6 (L) 3.5 - 5.0 g/dL   AST 15 15 - 41 U/L   ALT 14 0 - 44 U/L   Alkaline Phosphatase 85 38 - 126 U/L   Total Bilirubin 0.6 0.3 - 1.2 mg/dL   GFR calc non Af Amer 21 (L) >60 mL/min   GFR calc Af Amer 24 (L) >60 mL/min   Anion gap 8 5 - 15  CBC     Status: Abnormal   Collection Time: 04/05/19  4:59 AM  Result Value Ref Range   WBC 6.2 4.0 - 10.5 K/uL   RBC 3.45 (L) 4.22 - 5.81 MIL/uL   Hemoglobin 10.6 (L) 13.0 - 17.0 g/dL   HCT 32.0 (L) 39.0 - 52.0 %   MCV 92.8 80.0 - 100.0 fL   MCH 30.7 26.0 - 34.0 pg   MCHC 33.1 30.0 - 36.0 g/dL   RDW 13.7 11.5 - 15.5 %   Platelets 220 150 - 400 K/uL   nRBC 0.0 0.0 - 0.2 %   Assessment/Plan:  Assessment:  Shaner is a pleasant 63 year old gentleman with a past medical history of recent ST elevation myocardial infarction with total occlusion of the mid LAD treated with angioplasty and stenting, hypertension, hyperlipidemia, chronic kidney disease and congestive heart failure who presents after an episode of syncope today after getting his blood drawn with a negative ACS work-up whose presentation is consistent with vasovagal syncope.  Plan by Problem: Active Problems:   Syncope -patient history very consistent with vaso-vagal syncope -ACS workup unremarkable -EKG without ischemic changes and serial troponins continuing to downtrend since his STEMI -other major concerns include a new heart block but given location of patient's STEMI this is less likely -patient is HDS without concern for ventricular free wall rupture or VSD -no significant respiratory sx or crackles on exam concerning for a mitral valve chordae tendinae rupture, plus the location of the MI makes this less likely as well  -no aortic valve abnormalities  noted on Echo at time of STEMI -cards consulted who does not believe this  to be cardiac in nature -no new abnormal rhythms noted on telemetry  Plan: -fu orthostatics -dc w/ pcp and cards fu  CAD: -continue aspirin 81 mg daily -continue brilinta 90 mg daily  Hypertension: -holding antihypertensives, coreg and bidil, in setting of syncope -fu orthostatics  Hyperlipidemia: -continue atorvastatin 80 mg daily  HFrEF: -holding antihypertensives, coreg and bidil, in setting of syncope -fu orthostatics  CKD: -avoid nephrotoxic agents  Dispo: Anticipated discharge today.  Al Decant, MD 04/05/2019, 8:01 AM Pager: [phone]

## 2019-04-05 NOTE — Discharge Summary (Signed)
Name: [name] MRN: [id number] DOB: [birth year] 63 y.o. PCP: Maryruth Hancock, MD  Date of Admission: 04/04/2019 10:47 AM Date of Discharge:  04/05/19 Attending Physician: Bartholomew Crews, MD  Discharge Diagnosis:  1. Syncope  Discharge Medications: Allergies as of 04/05/2019   No Known Allergies     Medication List    TAKE these medications   aspirin 81 MG chewable tablet Chew 1 tablet (81 mg total) by mouth daily.   atorvastatin 80 MG tablet Commonly known as: LIPITOR Take 1 tablet (80 mg total) by mouth daily at 6 PM.   carvedilol 25 MG tablet Commonly known as: COREG Take 1 tablet (25 mg total) by mouth 2 (two) times daily with a meal.   isosorbide-hydrALAZINE 20-37.5 MG tablet Commonly known as: BIDIL Take 1 tablet by mouth 2 (two) times daily.   nitroGLYCERIN 0.4 MG SL tablet Commonly known as: Nitrostat Place 1 tablet (0.4 mg total) under the tongue every 5 (five) minutes as needed.   ticagrelor 90 MG Tabs tablet Commonly known as: BRILINTA Take 1 tablet (90 mg total) by mouth 2 (two) times daily.       Disposition and follow-up:   [name] was discharged from [facility] in Good condition.  At the hospital follow up visit please address:   1.  Please ensure patient without further syncope and that he continues to be without palpitations.   2.  Labs / imaging needed at time of follow-up: na  3.  Pending labs/ test needing follow-up: na  Follow-up Appointments: Follow-up Information    Belva Crome, MD Follow up in 1 week(s).   Specialty: Cardiology Contact information: [id number] [address] [phone]        Maryruth Hancock, MD Follow up.   Specialty: Family Medicine Contact information: [address] [place] [address] [facility] Course by problem list:  1. Syncope -patient history very consistent with vaso-vagal syncope -ACS workup  unremarkable -EKG without ischemic changes and serial troponins continuing to downtrend since his STEMI -other major post-MI concerns include a new heart block, but given location of patient's STEMI this is less likely -patient is HDS without concern for ventricular free wall rupture or VSD -patient HDS without significant respiratory sx or crackles on exam concerning for a mitral valve chordae tendinae rupture, plus the location of the MI makes this less likely as well  -no aortic valve abnormalities noted on Echo at time of STEMI -no new abnormal rhythms noted on telemetry -cards consulted who does not believe this to be cardiac in nature  -orthostatics normal -plan for fu w/ PCP and cardiology  Discharge Vitals:   BP 108/84 (BP Location: Left Arm)   Pulse 95   Temp 97.6 F (36.4 C) (Oral)   Resp 15   Ht 6' (1.829 m)   Wt 84.8 kg   SpO2 96%   BMI 25.36 kg/m   Pertinent Labs, Studies, and Procedures:   Labs: Lab Results Last 24 Hours[] Expand by Default       Results for orders placed or performed during the hospital encounter of 04/04/19 (from the past 24 hour(s))  CBC WITH DIFFERENTIAL     Status: Abnormal   Collection Time: 04/04/19 11:05 AM  Result Value Ref Range   WBC 6.0 4.0 - 10.5 K/uL   RBC 3.68 (L) 4.22 - 5.81 MIL/uL   Hemoglobin  11.3 (L) 13.0 - 17.0 g/dL   HCT 35.5 (L) 39.0 - 52.0 %   MCV 96.5 80.0 - 100.0 fL   MCH 30.7 26.0 - 34.0 pg   MCHC 31.8 30.0 - 36.0 g/dL   RDW 14.1 11.5 - 15.5 %   Platelets 218 150 - 400 K/uL   nRBC 0.0 0.0 - 0.2 %   Neutrophils Relative % 66 %   Neutro Abs 4.0 1.7 - 7.7 K/uL   Lymphocytes Relative 23 %   Lymphs Abs 1.4 0.7 - 4.0 K/uL   Monocytes Relative 7 %   Monocytes Absolute 0.4 0.1 - 1.0 K/uL   Eosinophils Relative 3 %   Eosinophils Absolute 0.2 0.0 - 0.5 K/uL   Basophils Relative 1 %   Basophils Absolute 0.0 0.0 - 0.1 K/uL   Immature Granulocytes 0 %   Abs Immature Granulocytes 0.02 0.00 - 0.07  K/uL  CBG monitoring, ED     Status: Abnormal   Collection Time: 04/04/19 11:12 AM  Result Value Ref Range   Glucose-Capillary 115 (H) 70 - 99 mg/dL  Magnesium     Status: None   Collection Time: 04/04/19 12:40 PM  Result Value Ref Range   Magnesium 2.1 1.7 - 2.4 mg/dL  Troponin I (High Sensitivity)     Status: Abnormal   Collection Time: 04/04/19 12:40 PM  Result Value Ref Range   Troponin I (High Sensitivity) 600 (HH) <18 ng/L  Basic metabolic panel     Status: Abnormal   Collection Time: 04/04/19 12:40 PM  Result Value Ref Range   Sodium 140 135 - 145 mmol/L   Potassium 5.2 (H) 3.5 - 5.1 mmol/L   Chloride 114 (H) 98 - 111 mmol/L   CO2 18 (L) 22 - 32 mmol/L   Glucose, Bld 110 (H) 70 - 99 mg/dL   BUN 32 (H) 8 - 23 mg/dL   Creatinine, Ser 3.13 (H) 0.61 - 1.24 mg/dL   Calcium 10.2 8.9 - 10.3 mg/dL   GFR calc non Af Amer 20 (L) >60 mL/min   GFR calc Af Amer 24 (L) >60 mL/min   Anion gap 8 5 - 15  Troponin I (High Sensitivity)     Status: Abnormal   Collection Time: 04/04/19  3:00 PM  Result Value Ref Range   Troponin I (High Sensitivity) 592 (HH) <18 ng/L      EKG: personally reviewed my interpretation is sinus rhythm with no acute ischemic changes.  CXR: personally reviewed my interpretation is no acute cardiopulmonary abnormality.   Discharge Instructions: Discharge Instructions    Diet - low sodium heart healthy   Complete by: As directed    Increase activity slowly   Complete by: As directed       Signed: Al Decant, MD 04/05/2019, 10:11 AM   Pager: [phone]

## 2019-04-05 NOTE — Plan of Care (Signed)

## 2019-04-06 ENCOUNTER — Other Ambulatory Visit: Payer: Self-pay

## 2019-04-06 ENCOUNTER — Ambulatory Visit (HOSPITAL_COMMUNITY)
Admission: RE | Admit: 2019-04-06 | Discharge: 2019-04-06 | Disposition: A | Discharge status: Home / Self Care | Payer: Medicaid Other | Source: Ambulatory Visit | Attending: Nephrology | Admitting: Nephrology

## 2019-04-06 DIAGNOSIS — N184 Chronic kidney disease, stage 4 (severe): Secondary | ICD-10-CM

## 2019-04-11 ENCOUNTER — Encounter: Payer: Self-pay | Admitting: Family Medicine

## 2019-04-11 ENCOUNTER — Other Ambulatory Visit: Payer: Self-pay

## 2019-04-11 ENCOUNTER — Ambulatory Visit: Payer: Medicaid Other | Admitting: Family Medicine

## 2019-04-11 VITALS — BP 119/65 | HR 84 | Temp 97.5°F | Ht 72.0 in | Wt 191.8 lb

## 2019-04-11 DIAGNOSIS — R55 Syncope and collapse: Secondary | ICD-10-CM | POA: Diagnosis not present

## 2019-04-11 DIAGNOSIS — E785 Hyperlipidemia, unspecified: Secondary | ICD-10-CM

## 2019-04-11 DIAGNOSIS — I10 Essential (primary) hypertension: Secondary | ICD-10-CM

## 2019-04-11 MED ORDER — NITROGLYCERIN 0.4 MG SL SUBL: 0.4000 mg | SUBLINGUAL_TABLET | SUBLINGUAL | 2 refills | Status: DC | PRN

## 2019-04-11 MED ORDER — ATORVASTATIN CALCIUM 80 MG PO TABS: 80.0000 mg | ORAL_TABLET | Freq: Every day | ORAL | 1 refills | Status: DC

## 2019-04-11 MED ORDER — TICAGRELOR 90 MG PO TABS: 90.0000 mg | ORAL_TABLET | Freq: Two times a day (BID) | ORAL | 0 refills | Status: DC

## 2019-04-11 MED ORDER — ISOSORB DINITRATE-HYDRALAZINE 20-37.5 MG PO TABS: 1.0000 | ORAL_TABLET | Freq: Two times a day (BID) | ORAL | 1 refills | Status: DC

## 2019-04-11 MED ORDER — CARVEDILOL 25 MG PO TABS: 25.0000 mg | ORAL_TABLET | Freq: Two times a day (BID) | ORAL | 1 refills | Status: DC

## 2019-04-11 NOTE — Patient Instructions (Signed)
Will arrange a follow up appointment for kidney specialist to review labwork and ultrasound  Keep cardiology appointment for follow up   Take all medications as directed by cardiology-refills sent to [facility]

## 2019-04-11 NOTE — Progress Notes (Signed)
Established Patient Office Visit  Subjective:  Patient ID: [name], male    DOB: [birth year]  Age: 63 y.o. MRN: [id number]  CC:  Chief Complaint  Patient presents with  . Follow-up  recently hospitalized for syncope  HPI [name] presents for follow up -syncope-seen in hospital after passing out while having blood drawn.  Pt states no associated SOB, CP or seizure.  Pt with no injury at time of incident. Pt has not seen cardiology in follow up    Name: [name] MRN: [id number] DOB: [birth year] 62 y.o. PCP: Maryruth Hancock, MD  Date of Admission: 04/04/2019 10:47 AM Date of Discharge:  04/05/19 Attending Physician: Bartholomew Crews, MD  Discharge Diagnosis:  1. Syncope  Discharge Medications: Allergies as of 04/05/2019   No Known Allergies        Medication List    TAKE these medications   aspirin 81 MG chewable tablet Chew 1 tablet (81 mg total) by mouth daily.   atorvastatin 80 MG tablet Commonly known as: LIPITOR Take 1 tablet (80 mg total) by mouth daily at 6 PM.   carvedilol 25 MG tablet Commonly known as: COREG Take 1 tablet (25 mg total) by mouth 2 (two) times daily with a meal.   isosorbide-hydrALAZINE 20-37.5 MG tablet Commonly known as: BIDIL Take 1 tablet by mouth 2 (two) times daily.   nitroGLYCERIN 0.4 MG SL tablet Commonly known as: Nitrostat Place 1 tablet (0.4 mg total) under the tongue every 5 (five) minutes as needed.   ticagrelor 90 MG Tabs tablet Commonly known as: BRILINTA Take 1 tablet (90 mg total) by mouth 2 (two) times daily.       Disposition and follow-up:   Mr.[name] was discharged from [facility] in Good condition.  At the hospital follow up visit please address:   1.  Please ensure patient without further syncope and that he continues to be without palpitations.   2.  Labs / imaging needed at time of follow-up: na  3.  Pending labs/ test needing  follow-up: na  Follow-up Appointments:    Follow-up Information    Belva Crome, MD Follow up in 1 week(s).   Specialty: Cardiology Contact information: [id number] [address] [phone]        Maryruth Hancock, MD Follow up.   Specialty: Family Medicine Contact information: [place] [address] [phone]            Past Medical History:  Diagnosis Date  . Heart attack ([place]) 03/15/2019  . Hypertension 2002  . Kidney stones   . Left knee pain     Past Surgical History:  Procedure Laterality Date  . AMPUTATION TOE Left 1991   2 digit of left foot  . CHOLECYSTECTOMY    . CORONARY/GRAFT ACUTE MI REVASCULARIZATION N/A 03/12/2019   Procedure: Coronary/Graft Acute MI Revascularization;  Surgeon: Belva Crome, MD;  Location: [facility];  Service: Cardiovascular;  Laterality: N/A;  . KIDNEY STONE SURGERY Right   . LEFT HEART CATH AND CORONARY ANGIOGRAPHY N/A 03/12/2019   Procedure: LEFT HEART CATH AND CORONARY ANGIOGRAPHY;  Surgeon: Belva Crome, MD;  Location: [place];  Service: Cardiovascular;  Laterality: N/A;    Family History  Problem Relation Age of Onset  . Heart failure Mother   . Heart disease Mother   . Cancer Mother        breast  .  Heart attack Father   . Hypertension Father   . Heart disease Father     Social History   Socioeconomic History  . Marital status: Single    Spouse name: Not on file  . Number of children: Not on file  . Years of education: Not on file  . Highest education level: Not on file  Occupational History  . Occupation: retired  Scientific laboratory technician  . Financial resource strain: Not on file  . Food insecurity    Worry: Not on file    Inability: Not on file  . Transportation needs    Medical: Not on file    Non-medical: Not on file  Tobacco Use  . Smoking status: Current Some Day Smoker    Packs/day: 0.50    Years: 40.00    Pack years: 20.00     Types: Cigarettes    Last attempt to quit: 05/25/2016    Years since quitting: 2.8  . Smokeless tobacco: Never Used  Substance and Sexual Activity  . Alcohol use: No  . Drug use: No  . Sexual activity: Not on file  Lifestyle  . Physical activity    Days per week: Not on file    Minutes per session: Not on file  . Stress: Not on file  Relationships  . Social Herbalist on phone: Patient refused    Gets together: Patient refused    Attends religious service: Patient refused    Active member of club or organization: Patient refused    Attends meetings of clubs or organizations: Patient refused    Relationship status: Patient refused  . Intimate partner violence    Fear of current or ex partner: Patient refused    Emotionally abused: Patient refused    Physically abused: Patient refused    Forced sexual activity: Patient refused  Other Topics Concern  . Not on file  Social History Narrative  . Not on file    Outpatient Medications Prior to Visit  Medication Sig Dispense Refill  . aspirin 81 MG chewable tablet Chew 1 tablet (81 mg total) by mouth daily. 90 tablet 1  . atorvastatin (LIPITOR) 80 MG tablet Take 1 tablet (80 mg total) by mouth daily at 6 PM. 90 tablet 1  . carvedilol (COREG) 25 MG tablet Take 1 tablet (25 mg total) by mouth 2 (two) times daily with a meal. 60 tablet 1  . isosorbide-hydrALAZINE (BIDIL) 20-37.5 MG tablet Take 1 tablet by mouth 2 (two) times daily. 60 tablet 1  . nitroGLYCERIN (NITROSTAT) 0.4 MG SL tablet Place 1 tablet (0.4 mg total) under the tongue every 5 (five) minutes as needed. 25 tablet 2  . ticagrelor (BRILINTA) 90 MG TABS tablet Take 1 tablet (90 mg total) by mouth 2 (two) times daily. 180 tablet 1   No facility-administered medications prior to visit.     No Known Allergies  ROS Review of Systems  Constitutional: Negative.   HENT: Negative.   Respiratory: Negative.   Cardiovascular:       CAD-pt did not keep cardio appt   Gastrointestinal: Negative.   Genitourinary:       CKD  Neurological: Positive for dizziness and syncope.      Objective:    Physical Exam  Constitutional: He is oriented to person, place, and time. He appears well-developed and well-nourished. No distress.  Cardiovascular: Normal rate, regular rhythm and normal heart sounds.  Pulmonary/Chest: Effort normal and breath sounds normal.  Neurological: He is alert  and oriented to person, place, and time.    BP 119/65 (BP Location: Left Arm, Patient Position: Sitting, Cuff Size: Normal)   Pulse 84   Temp (!) 97.5 F (36.4 C) (Oral)   Ht 6' (1.829 m)   Wt 191 lb 12.8 oz (87 kg)   SpO2 98%   BMI 26.01 kg/m  Wt Readings from Last 3 Encounters:  04/11/19 191 lb 12.8 oz (87 kg)  04/04/19 187 lb (84.8 kg)  03/21/19 186 lb 6.4 oz (84.6 kg)     Health Maintenance Due  Topic Date Due  . Hepatitis C Screening  1955-06-03  . TETANUS/TDAP  06/29/1976  . COLONOSCOPY  06/30/2007  . INFLUENZA VACCINE  12/03/2018   No results found for: TSH Lab Results  Component Value Date   WBC 6.2 04/05/2019   HGB 10.6 (L) 04/05/2019   HCT 32.0 (L) 04/05/2019   MCV 92.8 04/05/2019   PLT 220 04/05/2019   Lab Results  Component Value Date   NA 141 04/05/2019   K 4.3 04/05/2019   CO2 19 (L) 04/05/2019   GLUCOSE 116 (H) 04/05/2019   BUN 32 (H) 04/05/2019   CREATININE 3.09 (H) 04/05/2019   BILITOT 0.6 04/05/2019   ALKPHOS 85 04/05/2019   AST 15 04/05/2019   ALT 14 04/05/2019   PROT 6.5 04/05/2019   ALBUMIN 2.6 (L) 04/05/2019   CALCIUM 10.2 04/05/2019   ANIONGAP 8 04/05/2019   Lab Results  Component Value Date   CHOL 193 03/12/2019   Lab Results  Component Value Date   HDL 42 03/12/2019   Lab Results  Component Value Date   LDLCALC 114 (H) 03/12/2019   Lab Results  Component Value Date   TRIG 184 (H) 03/12/2019   Lab Results  Component Value Date   CHOLHDL 4.6 03/12/2019   Lab Results  Component Value Date   HGBA1C 5.4  03/13/2019      Assessment & Plan:  1. Syncope, unspecified syncope type Pt feels related to blood draw-no h/o of previous syncopal episodes-no chest pain, sob associated No h/o seizure d/o  2. Essential hypertension Stable on current medication -cardo appt pending-reinforced need to attend appointment  3. Hyperlipidemia, unspecified hyperlipidemia type LDL slightly elevated-lipitor daily-lft normal, lipid panel 03/12/19 Follow-up: 2 months-recheck lipid panel and lft Pt to follow up with nephro to review labwork and cardiology   Jisella Ashenfelter Hannah Beat, MD

## 2019-04-18 ENCOUNTER — Other Ambulatory Visit: Payer: Self-pay | Admitting: Family Medicine

## 2019-04-18 ENCOUNTER — Telehealth: Payer: Self-pay | Admitting: Family Medicine

## 2019-04-18 MED ORDER — CARVEDILOL 12.5 MG PO TABS: 12.5000 mg | ORAL_TABLET | Freq: Two times a day (BID) | ORAL | 0 refills | Status: DC

## 2019-04-18 NOTE — Telephone Encounter (Signed)
Pt called after hours provider number 12/12 with concern for low blood pressure-95/63-taking isosorbidil and carvedilol. Pt did not keep his follow up appointment with cardiology. Pt must keep this appt next week.  Recommend: Keep cardiology appointment Deduce dose of carvediolol to 12.5mg  twice a day -continue to check blood pressure  Schedule a virtual appointment for tomorrow-have written bp readings available when we talk tomorrow  For further syncopal concerns, CP, or other concerns, go to ER for evaluation

## 2019-04-18 NOTE — Telephone Encounter (Signed)
Patient is aware of the recommendation 

## 2019-04-19 ENCOUNTER — Telehealth: Payer: Self-pay | Admitting: Family Medicine

## 2019-04-19 NOTE — Telephone Encounter (Signed)
Patient called and states he spoke with [name] yesterday about medication change and did not understand what he is supposed to be doing. Requesting [name] to call him again.

## 2019-04-19 NOTE — Telephone Encounter (Signed)
Spoke to Mr Unrath again

## 2019-04-24 ENCOUNTER — Other Ambulatory Visit: Payer: Self-pay

## 2019-04-24 ENCOUNTER — Ambulatory Visit (HOSPITAL_COMMUNITY)
Admission: RE | Admit: 2019-04-24 | Discharge: 2019-04-24 | Disposition: A | Discharge status: Home / Self Care | Payer: Medicaid Other | Source: Ambulatory Visit | Attending: Internal Medicine | Admitting: Internal Medicine

## 2019-04-24 ENCOUNTER — Encounter (HOSPITAL_COMMUNITY): Payer: Self-pay | Admitting: Internal Medicine

## 2019-04-24 VITALS — BP 118/90 | HR 88 | Wt 193.6 lb

## 2019-04-24 DIAGNOSIS — I5022 Chronic systolic (congestive) heart failure: Secondary | ICD-10-CM | POA: Diagnosis not present

## 2019-04-24 DIAGNOSIS — I2102 ST elevation (STEMI) myocardial infarction involving left anterior descending coronary artery: Secondary | ICD-10-CM | POA: Diagnosis not present

## 2019-04-24 DIAGNOSIS — Z955 Presence of coronary angioplasty implant and graft: Secondary | ICD-10-CM | POA: Diagnosis not present

## 2019-04-24 DIAGNOSIS — I252 Old myocardial infarction: Secondary | ICD-10-CM | POA: Diagnosis not present

## 2019-04-24 DIAGNOSIS — Z7982 Long term (current) use of aspirin: Secondary | ICD-10-CM | POA: Insufficient documentation

## 2019-04-24 DIAGNOSIS — I130 Hypertensive heart and chronic kidney disease with heart failure and stage 1 through stage 4 chronic kidney disease, or unspecified chronic kidney disease: Secondary | ICD-10-CM | POA: Insufficient documentation

## 2019-04-24 DIAGNOSIS — I2510 Atherosclerotic heart disease of native coronary artery without angina pectoris: Secondary | ICD-10-CM

## 2019-04-24 DIAGNOSIS — Z803 Family history of malignant neoplasm of breast: Secondary | ICD-10-CM | POA: Insufficient documentation

## 2019-04-24 DIAGNOSIS — N184 Chronic kidney disease, stage 4 (severe): Secondary | ICD-10-CM | POA: Diagnosis not present

## 2019-04-24 DIAGNOSIS — Z87442 Personal history of urinary calculi: Secondary | ICD-10-CM | POA: Diagnosis not present

## 2019-04-24 DIAGNOSIS — Z7902 Long term (current) use of antithrombotics/antiplatelets: Secondary | ICD-10-CM | POA: Diagnosis not present

## 2019-04-24 DIAGNOSIS — Z87891 Personal history of nicotine dependence: Secondary | ICD-10-CM | POA: Insufficient documentation

## 2019-04-24 DIAGNOSIS — Z8249 Family history of ischemic heart disease and other diseases of the circulatory system: Secondary | ICD-10-CM | POA: Diagnosis not present

## 2019-04-24 DIAGNOSIS — Z79899 Other long term (current) drug therapy: Secondary | ICD-10-CM | POA: Diagnosis not present

## 2019-04-24 LAB — BASIC METABOLIC PANEL
Anion gap: 6 (ref 5–15)
BUN: 26 mg/dL — ABNORMAL HIGH (ref 8–23)
CO2: 20 mmol/L — ABNORMAL LOW (ref 22–32)
Calcium: 10.7 mg/dL — ABNORMAL HIGH (ref 8.9–10.3)
Chloride: 115 mmol/L — ABNORMAL HIGH (ref 98–111)
Creatinine, Ser: 2.99 mg/dL — ABNORMAL HIGH (ref 0.61–1.24)
GFR calc Af Amer: 25 mL/min — ABNORMAL LOW (ref >60)
GFR calc non Af Amer: 22 mL/min — ABNORMAL LOW (ref >60)
Glucose, Bld: 98 mg/dL (ref 70–99)
Potassium: 4.6 mmol/L (ref 3.5–5.1)
Sodium: 141 mmol/L (ref 135–145)

## 2019-04-24 LAB — CBC
HCT: 37.1 % — ABNORMAL LOW (ref 39.0–52.0)
Hemoglobin: 11.8 g/dL — ABNORMAL LOW (ref 13.0–17.0)
MCH: 31 pg (ref 26.0–34.0)
MCHC: 31.8 g/dL (ref 30.0–36.0)
MCV: 97.4 fL (ref 80.0–100.0)
Platelets: 218 10*3/uL (ref 150–400)
RBC: 3.81 MIL/uL — ABNORMAL LOW (ref 4.22–5.81)
RDW: 14.6 % (ref 11.5–15.5)
WBC: 5.8 10*3/uL (ref 4.0–10.5)
nRBC: 0 % (ref 0.0–0.2)

## 2019-04-24 LAB — BRAIN NATRIURETIC PEPTIDE: B Natriuretic Peptide: 765.4 pg/mL — ABNORMAL HIGH (ref 0.0–100.0)

## 2019-04-24 MED ORDER — FUROSEMIDE 40 MG PO TABS: 40.0000 mg | ORAL_TABLET | Freq: Every day | ORAL | 3 refills | Status: DC | PRN

## 2019-04-24 NOTE — Progress Notes (Signed)
ADVANCED HF CLINIC CONSULT NOTE  Referring Physician: DR. Burt Knack Primary Care: Dr. Orson Ape   HPI:  63 y/o male smoker with HTN, CKD IV, CAD and systolic HF. Referred by Dr. Burt Knack for further management of systolic HF.  Experienced a large anterolateral STEMI on 03/12/19. Taken to cath lab.   Cath 03/12/2019  Total occlusion of the mid LAD treated with angioplasty and stenting using a 4.0 x 22 Onyx deployed at 14 atm. 0% stenosis was noted post procedure and TIMI grade III flow increased from 0 to 3. The final angiographic images of the LAD demonstrates apical occlusion due to embolus.  Luminal irregularities in the left main but no agreeable stenosis.  Large circumflex with 3 obtuse marginals. The first marginal is large and has a very distal 90% stenosis in 2 branches. There is a trifurcation distally in the first marginal.  RCA Moderate diffuse luminal irregularities but widely patent.  Mid anterior wall to apical akinesis. EF 30 to 35% acutely. LVEDP 31 mmHg.   Post cath had low BP and shock but recovered. EF 20% by echo. No significant MR. Creatinine post cath 3.1-3.4  Recently admitted with syncopal episode in setting of blood draw. Felt to be vaso-vagal.   Says overall feeling pretty good. Lives with his elderly mother. Able to all ADLs and go to store without too much trouble. Gets SOB at ties if he goes too quickly. Has stopped smoking. Follows BP every day  SBP 110-120    Review of Systems: [y] = yes, '[ ]'  = no   General: Weight gain '[ ]' ; Weight loss '[ ]' ; Anorexia '[ ]' ; Fatigue '[ ]' ; Fever '[ ]' ; Chills '[ ]' ; Weakness '[ ]'   Cardiac: Chest pain/pressure '[ ]' ; Resting SOB '[ ]' ; Exertional SOB [ y]; Orthopnea '[ ]' ; Pedal Edema '[ ]' ; Palpitations '[ ]' ; Syncope '[ ]' ; Presyncope '[ ]' ; Paroxysmal nocturnal dyspnea'[ ]'   Pulmonary: Cough '[ ]' ; Wheezing'[ ]' ; Hemoptysis'[ ]' ; Sputum '[ ]' ; Snoring '[ ]'   GI: Vomiting'[ ]' ; Dysphagia'[ ]' ; Melena'[ ]' ; Hematochezia '[ ]' ; Heartburn'[ ]' ; Abdominal pain  '[ ]' ; Constipation '[ ]' ; Diarrhea '[ ]' ; BRBPR '[ ]'   GU: Hematuria'[ ]' ; Dysuria '[ ]' ; Nocturia'[ ]'   Vascular: Pain in legs with walking '[ ]' ; Pain in feet with lying flat '[ ]' ; Non-healing sores '[ ]' ; Stroke '[ ]' ; TIA '[ ]' ; Slurred speech '[ ]' ;  Neuro: Headaches'[ ]' ; Vertigo'[ ]' ; Seizures'[ ]' ; Paresthesias'[ ]' ;Blurred vision '[ ]' ; Diplopia '[ ]' ; Vision changes '[ ]'   Ortho/Skin: Arthritis Blue.Reese ]; Joint pain Blue.Reese ]; Muscle pain '[ ]' ; Joint swelling '[ ]' ; Back Pain '[ ]' ; Rash '[ ]'   Psych: Depression'[ ]' ; Anxiety'[ ]'   Heme: Bleeding problems '[ ]' ; Clotting disorders '[ ]' ; Anemia '[ ]'   Endocrine: Diabetes '[ ]' ; Thyroid dysfunction'[ ]'    Past Medical History:  Diagnosis Date  . Heart attack ([place]) 03/15/2019  . Hypertension 2002  . Kidney stones   . Left knee pain     Current Outpatient Medications  Medication Sig Dispense Refill  . aspirin 81 MG chewable tablet Chew 1 tablet (81 mg total) by mouth daily. 90 tablet 1  . atorvastatin (LIPITOR) 80 MG tablet Take 1 tablet (80 mg total) by mouth daily at 6 PM. 90 tablet 1  . carvedilol (COREG) 12.5 MG tablet Take 1 tablet (12.5 mg total) by mouth 2 (two) times daily with a meal. 60 tablet 0  . isosorbide-hydrALAZINE (BIDIL) 20-37.5 MG tablet Take 1 tablet by mouth 2 (  two) times daily. 60 tablet 1  . nitroGLYCERIN (NITROSTAT) 0.4 MG SL tablet Place 1 tablet (0.4 mg total) under the tongue every 5 (five) minutes as needed. 25 tablet 2  . ticagrelor (BRILINTA) 90 MG TABS tablet Take 1 tablet (90 mg total) by mouth 2 (two) times daily. 180 tablet 0   No current facility-administered medications for this encounter.    No Known Allergies    Social History   Socioeconomic History  . Marital status: Single    Spouse name: Not on file  . Number of children: Not on file  . Years of education: Not on file  . Highest education level: Not on file  Occupational History  . Occupation: retired  Tobacco Use  . Smoking status: Former Smoker    Packs/day: 0.50    Years: 40.00    Pack  years: 20.00    Types: Cigarettes    Quit date: 03/12/2019    Years since quitting: 0.1  . Smokeless tobacco: Never Used  Substance and Sexual Activity  . Alcohol use: No  . Drug use: No  . Sexual activity: Not on file  Other Topics Concern  . Not on file  Social History Narrative  . Not on file   Social Determinants of Health   Financial Resource Strain:   . Difficulty of Paying Living Expenses: Not on file  Food Insecurity:   . Worried About Charity fundraiser in the Last Year: Not on file  . Ran Out of Food in the Last Year: Not on file  Transportation Needs:   . Lack of Transportation (Medical): Not on file  . Lack of Transportation (Non-Medical): Not on file  Physical Activity:   . Days of Exercise per Week: Not on file  . Minutes of Exercise per Session: Not on file  Stress:   . Feeling of Stress : Not on file  Social Connections: Unknown  . Frequency of Communication with Friends and Family: Patient refused  . Frequency of Social Gatherings with Friends and Family: Patient refused  . Attends Religious Services: Patient refused  . Active Member of Clubs or Organizations: Patient refused  . Attends Archivist Meetings: Patient refused  . Marital Status: Patient refused  Intimate Partner Violence: Unknown  . Fear of Current or Ex-Partner: Patient refused  . Emotionally Abused: Patient refused  . Physically Abused: Patient refused  . Sexually Abused: Patient refused      Family History  Problem Relation Age of Onset  . Heart failure Mother   . Heart disease Mother   . Cancer Mother        breast  . Heart attack Father   . Hypertension Father   . Heart disease Father     Vitals:   04/24/19 1114  BP: 118/90  Pulse: 88  SpO2: 98%  Weight: 87.8 kg (193 lb 9.6 oz)    PHYSICAL EXAM: General:  Well appearing. No respiratory difficulty HEENT: normal Neck: supple. no JVD. Carotids 2+ bilat; no bruits. No lymphadenopathy or thryomegaly  appreciated. Cor: PMI nondisplaced. Regular rate & rhythm. No rubs, gallops or murmurs. Lungs: clear Abdomen: soft, nontender, nondistended. No hepatosplenomegaly. No bruits or masses. Good bowel sounds. Extremities: no cyanosis, clubbing, rash, edema Neuro: alert & oriented x 3, cranial nerves grossly intact. moves all 4 extremities w/o difficulty. Affect pleasant.  ECG: NSR 90 anterolateral q waves with persistent ST elevation. Marland Kitchendbppr   ASSESSMENT & PLAN:  1. CAD s/p acute anterolateral STEMI 03/12/19 -  s/p DES LAD - EF 20%. Given ECG findings I do not suspect significant recovery - No s/s ischemia - Continue DAPT, statin - Refer to CR  2. Chronic systolic HF - EF 03%. Given ECG findings I do not suspect significant recovery - NYHA II-III - Volume status good. Will give lasix 40 to use PRN - Continue Bidil 1 tab TID and carvedilol 12.5 bid. Will not increase today with soft BP and CKD IV - Not candidate for Entresto, SGLT2i or spiro with CKD IV. Consider ivabradine as needed - Return in 6 weeks with echo IF EF <= 35% Will need ICD   3. CKD IV - due to HTN nephropathy - follows with Renal tomorrow  4. HTN - BP well controlled currently  5. Tobacco use - congratulated on cessation.   Glori Bickers, MD  12:19 PM

## 2019-04-24 NOTE — Patient Instructions (Signed)
Take Furosemide 40 mg AS NEEDED  Labs done today, we will call you for abnormal results  You have been referred to Cardiac Rehab at [facility], they will call you to schedule this  Your physician recommends that you schedule a follow-up appointment in: 2 months  If you have any questions or concerns before your next appointment please send [place] a message through [facility] or call our office at [phone].  At the [facility], you and your health needs are our priority. As part of our continuing mission to provide you with exceptional heart care, we have created designated Provider Care Teams. These Care Teams include your primary Cardiologist (physician) and Advanced Practice Providers (APPs- Physician Assistants and Nurse Practitioners) who all work together to provide you with the care you need, when you need it.   You may see any of the following providers on your designated Care Team at your next follow up: Marland Kitchen Dr Glori Bickers . Dr Loralie Champagne . Darrick Grinder, NP . Lyda Jester, PA . Audry Riles, PharmD   Please be sure to bring in all your medications bottles to every appointment.

## 2019-04-24 NOTE — Addendum Note (Signed)
Encounter addended by: Scarlette Calico, RN on: 04/24/2019 12:31 PM  Actions taken: Visit diagnoses modified, Diagnosis association updated, Pharmacy for encounter modified, Order list changed, Clinical Note Signed, Charge Capture section accepted

## 2019-05-04 ENCOUNTER — Ambulatory Visit: Payer: Medicaid Other | Admitting: Student

## 2019-05-11 ENCOUNTER — Other Ambulatory Visit: Payer: Self-pay | Admitting: Family Medicine

## 2019-05-11 NOTE — Telephone Encounter (Signed)
Requested medication (s) are due for refill today: yes  Requested medication (s) are on the active medication list: yes  Last refill:  04/18/2019  Future visit scheduled: no  Notes to clinic:  review for refill  Requested Prescriptions  Pending Prescriptions Disp Refills   carvedilol (COREG) 12.5 MG tablet [Pharmacy Med Name: Carvedilol 12.5 MG Oral Tablet] 60 tablet 0    Sig: TAKE 1 TABLET BY MOUTH TWICE DAILY WITH MEALS      There is no refill protocol information for this order      BRILINTA 90 MG TABS tablet [Pharmacy Med Name: Brilinta 90 MG Oral Tablet] 180 tablet 0    Sig: Take 1 tablet by mouth twice daily      There is no refill protocol information for this order

## 2019-05-11 NOTE — Telephone Encounter (Signed)
Cardiac medication management by cardiology-pt needs refills completed and managed by cardiology

## 2019-05-17 ENCOUNTER — Other Ambulatory Visit: Payer: Self-pay | Admitting: Family Medicine

## 2019-05-18 ENCOUNTER — Other Ambulatory Visit (HOSPITAL_COMMUNITY): Payer: Self-pay

## 2019-05-18 MED ORDER — CARVEDILOL 12.5 MG PO TABS: 12.5000 mg | ORAL_TABLET | Freq: Two times a day (BID) | ORAL | 1 refills | Status: DC

## 2019-05-18 MED ORDER — TICAGRELOR 90 MG PO TABS: 90.0000 mg | ORAL_TABLET | Freq: Two times a day (BID) | ORAL | 1 refills | Status: DC

## 2019-05-18 NOTE — Telephone Encounter (Signed)
Pt called requesting refills for brilinta and coreg. rx sent to [facility] as requested

## 2019-06-06 ENCOUNTER — Other Ambulatory Visit: Payer: Self-pay | Admitting: Family Medicine

## 2019-06-09 ENCOUNTER — Encounter: Payer: Self-pay | Admitting: Urology

## 2019-06-09 ENCOUNTER — Other Ambulatory Visit: Payer: Self-pay

## 2019-06-09 ENCOUNTER — Ambulatory Visit (INDEPENDENT_AMBULATORY_CARE_PROVIDER_SITE_OTHER): Payer: Medicaid Other | Admitting: Urology

## 2019-06-09 ENCOUNTER — Other Ambulatory Visit (HOSPITAL_COMMUNITY)
Admission: AD | Admit: 2019-06-09 | Discharge: 2019-06-09 | Disposition: A | Discharge status: Home / Self Care | Payer: Medicaid Other | Source: Other Acute Inpatient Hospital | Attending: Urology | Admitting: Urology

## 2019-06-09 VITALS — BP 108/76 | HR 105 | Temp 97.5°F | Ht 72.0 in | Wt 176.0 lb

## 2019-06-09 DIAGNOSIS — N200 Calculus of kidney: Secondary | ICD-10-CM

## 2019-06-09 DIAGNOSIS — N184 Chronic kidney disease, stage 4 (severe): Secondary | ICD-10-CM | POA: Diagnosis not present

## 2019-06-09 DIAGNOSIS — N281 Cyst of kidney, acquired: Secondary | ICD-10-CM

## 2019-06-09 LAB — POCT URINALYSIS DIPSTICK
Bilirubin, UA: NEGATIVE
Glucose, UA: NEGATIVE
Ketones, UA: NEGATIVE
Nitrite, UA: NEGATIVE
Protein, UA: POSITIVE — AB
Spec Grav, UA: 1.02 (ref 1.010–1.025)
Urobilinogen, UA: 0.2 E.U./dL
pH, UA: 5 (ref 5.0–8.0)

## 2019-06-09 NOTE — Progress Notes (Signed)
Subjective: 1. CKD (chronic kidney disease) stage 4, GFR 15-29 ml/min (HCC)   2. Bilateral nephrolithiasis   3. Renal cyst     [name] is a 64yo AAM who is sent in consultation by Dr. Dicky Doe for bilateral nephrolithiasis found on a recent renal US for evaluation of CKD 4.  There is a 1.6cm RLP stone and a probable staghorn stone on the left with moderate hydronephrosis.  He had a CT in 2012 and the RLP stone was about 1cm.  There was a 1.6cm LLP partial staghorn stone and an 58mm LMP stone and 1.2cm LUP stone but no obstruction.  Cysts were present bilateral on both the CT and renal US.   His recent UA was negative but his Cr is about 3.   He has no pain.  He has had prior stones and had a lithotripsy several years ago.  He has no hematuria.   He is currently on Brilinta for a coronary stent that was placed in November.   ROS:  Review of Systems  All other systems reviewed and are negative.  IPSS    Row Name 06/09/19 1100         International Prostate Symptom Score   How often have you had the sensation of not emptying your bladder?  Not at All     How often have you had to urinate less than every two hours?  Not at All     How often have you found you stopped and started again several times when you urinated?  Not at All     How often have you found it difficult to postpone urination?  Not at All     How often have you had a weak urinary stream?  Not at All     How often have you had to strain to start urination?  Not at All     How many times did you typically get up at night to urinate?  2 Times     Total IPSS Score  2       Quality of Life due to urinary symptoms   If you were to spend the rest of your life with your urinary condition just the way it is now how would you feel about that?  Pleased        No Known Allergies  Past Medical History:  Diagnosis Date  . CHF (congestive heart failure) ([place])   . CKD (chronic kidney disease) stage 4, GFR 15-29 ml/min  (HCC)   . Heart attack ([place]) 03/15/2019  . Hypertension 2002  . Kidney stones   . Left knee pain     Past Surgical History:  Procedure Laterality Date  . AMPUTATION TOE Left 1991   2 digit of left foot  . CHOLECYSTECTOMY    . CORONARY/GRAFT ACUTE MI REVASCULARIZATION N/A 03/12/2019   Procedure: Coronary/Graft Acute MI Revascularization;  Surgeon: Belva Crome, MD;  Location: [facility];  Service: Cardiovascular;  Laterality: N/A;  . LEFT HEART CATH AND CORONARY ANGIOGRAPHY N/A 03/12/2019   Procedure: LEFT HEART CATH AND CORONARY ANGIOGRAPHY;  Surgeon: Belva Crome, MD;  Location: [facility];  Service: Cardiovascular;  Laterality: N/A;  . LITHOTRIPSY Right     Social History   Socioeconomic History  . Marital status: Single    Spouse name: Not on file  . Number of children: Not on file  . Years of education: Not on file  . Highest education level:  Not on file  Occupational History  . Occupation: retired  Tobacco Use  . Smoking status: Former Smoker    Packs/day: 0.50    Years: 40.00    Pack years: 20.00    Types: Cigarettes    Quit date: 03/12/2019    Years since quitting: 0.2  . Smokeless tobacco: Never Used  Substance and Sexual Activity  . Alcohol use: No  . Drug use: No  . Sexual activity: Not on file  Other Topics Concern  . Not on file  Social History Narrative  . Not on file   Social Determinants of Health   Financial Resource Strain:   . Difficulty of Paying Living Expenses: Not on file  Food Insecurity:   . Worried About Charity fundraiser in the Last Year: Not on file  . Ran Out of Food in the Last Year: Not on file  Transportation Needs:   . Lack of Transportation (Medical): Not on file  . Lack of Transportation (Non-Medical): Not on file  Physical Activity:   . Days of Exercise per Week: Not on file  . Minutes of Exercise per Session: Not on file  Stress:   . Feeling of Stress : Not on file  Social Connections: Unknown  .  Frequency of Communication with Friends and Family: Patient refused  . Frequency of Social Gatherings with Friends and Family: Patient refused  . Attends Religious Services: Patient refused  . Active Member of Clubs or Organizations: Patient refused  . Attends Archivist Meetings: Patient refused  . Marital Status: Patient refused  Intimate Partner Violence: Unknown  . Fear of Current or Ex-Partner: Patient refused  . Emotionally Abused: Patient refused  . Physically Abused: Patient refused  . Sexually Abused: Patient refused    Family History  Problem Relation Age of Onset  . Heart failure Mother   . Heart disease Mother   . Cancer Mother        breast  . Heart attack Father   . Hypertension Father   . Heart disease Father     Anti-infectives: Anti-infectives (From admission, onward)   None      Current Outpatient Medications  Medication Sig Dispense Refill  . allopurinol (ZYLOPRIM) 100 MG tablet Take by mouth.    Marland Kitchen aspirin 81 MG chewable tablet Chew 1 tablet (81 mg total) by mouth daily. 90 tablet 1  . atorvastatin (LIPITOR) 80 MG tablet Take 1 tablet (80 mg total) by mouth daily at 6 PM. 90 tablet 1  . BIDIL 20-37.5 MG tablet Take 1 tablet by mouth twice daily 60 tablet 0  . carvedilol (COREG) 25 MG tablet Take by mouth.    . furosemide (LASIX) 40 MG tablet Take by mouth.    . nitroGLYCERIN (NITROSTAT) 0.4 MG SL tablet Place 1 tablet (0.4 mg total) under the tongue every 5 (five) minutes as needed. 25 tablet 2  . ticagrelor (BRILINTA) 90 MG TABS tablet Take 1 tablet (90 mg total) by mouth 2 (two) times daily. 180 tablet 1   No current facility-administered medications for this visit.     Objective:  BP 108/76   Pulse (!) 105   Temp (!) 97.5 F (36.4 C)   Ht 6' (1.829 m)   Wt 176 lb (79.8 kg)   BMI 23.87 kg/m    Physical Exam Vitals reviewed.  Constitutional:      Appearance: Normal appearance.  HENT:     Head: Normocephalic and atraumatic.   Cardiovascular:  Rate and Rhythm: Normal rate and regular rhythm.  Pulmonary:     Effort: Pulmonary effort is normal. No respiratory distress.     Breath sounds: Normal breath sounds.  Abdominal:     General: Abdomen is flat.     Palpations: Abdomen is soft.     Tenderness: There is no abdominal tenderness. There is no right CVA tenderness or left CVA tenderness.  Musculoskeletal:        General: No swelling or tenderness. Normal range of motion.     Cervical back: Normal range of motion and neck supple.  Skin:    General: Skin is warm and dry.  Neurological:     General: No focal deficit present.     Mental Status: He is alert and oriented to person, place, and time.  Psychiatric:        Mood and Affect: Mood normal.        Behavior: Behavior normal.     Lab Results:  Results for orders placed or performed in visit on 06/09/19 (from the past 24 hour(s))  POCT urinalysis dipstick     Status: Abnormal   Collection Time: 06/09/19 11:21 AM  Result Value Ref Range   Color, UA yellow    Clarity, UA     Glucose, UA Negative Negative   Bilirubin, UA neg    Ketones, UA neg    Spec Grav, UA 1.020 1.010 - 1.025   Blood, UA ++    pH, UA 5.0 5.0 - 8.0   Protein, UA Positive (A) Negative   Urobilinogen, UA 0.2 0.2 or 1.0 E.U./dL   Nitrite, UA neg    Leukocytes, UA Trace (A) Negative   Appearance clear    Odor       BMET No results for input(s): NA, K, CL, CO2, GLUCOSE, BUN, CREATININE, CALCIUM in the last 72 hours. PT/INR No results for input(s): LABPROT, INR in the last 72 hours. ABG No results for input(s): PHART, HCO3 in the last 72 hours.  Invalid input(s): PCO2, PO2  Studies/Results: I hav US RENAL (Accession [id number]) (Order [id number]) Imaging Date: 04/06/2019 Department: [facility] Released By: Dannette Barbara Authorizing: Liana Gerold, MD Exam Status  Status Final [99] PACS Intelerad Image Link  Show images for US RENAL Study  Result  CLINICAL DATA:  Chronic kidney disease  EXAM: RENAL / URINARY TRACT ULTRASOUND COMPLETE  COMPARISON:  CT 04/08/2011  FINDINGS: Right Kidney:  Renal measurements: 10.1 x 4.7 x 4.8 cm = volume: 123 mL. Cortical echogenicity is normal. No hydronephrosis. Multiple cysts, fewer than 10 within the right kidney. The largest is seen in the mid to lower pole and measures 1.6 cm. Shadowing stone in the lower pole of the right kidney measuring 16 mm.  Left Kidney:  Renal measurements: 11.2 x 6.2 x 5.9 cm = volume: 216 mL. Poor corticomedullary differentiation. Suspected moderate hydronephrosis of left kidney. Exophytic cyst off the lower pole measuring up to 6 cm. Multiple shadowing stones within the left renal collecting system. Diffuse shadowing in the mid to lower pole on the left, possibly due to staghorn calculus.  Bladder:  Urinary bladder is empty and therefore inadequately evaluated.  Other:  None.  IMPRESSION: 1. Multiple cysts, fewer than 10, within the right kidney. Negative for right hydronephrosis. 16 mm lower pole kidney stone 2. Decreased corticomedullary differentiation of left kidney with suspected moderate hydronephrosis. Multiple kidney stones on the left, diffuse shadowing in the mid to lower pole of left kidney possibly  due to staghorn calculus. 6 cm exophytic cyst off the lower pole of left kidney.   Electronically Signed   By: Donavan Foil M.D.   On: 04/06/2019 17:08    Assessment/Plan: Bilateral nephrolithiasis with a partial staghorn on the left and moderate left hydronephrosis.  I will get a CT stone study and he will likely need a left PCNL at a minimum but he may need stenting until he is able to come off of Brilinta for the procedure.   I will check with cardiology on this.       CKD4 which could be aggravated by the obstruction.   Renal cysts.  The cysts are bilateral but appear benign.    No orders of the defined  types were placed in this encounter.    Orders Placed This Encounter  Procedures  . Urine Culture    Standing Status:   Future    Standing Expiration Date:   07/07/2019  . CT RENAL STONE STUDY    Standing Status:   Future    Standing Expiration Date:   07/07/2019    Scheduling Instructions:     He needs this next week and I will call with the results.    Order Specific Question:   ** REASON FOR EXAM (FREE TEXT)    Answer:   Bilateral stone with a left partial staghorn with obstruction.    Order Specific Question:   Preferred imaging location?    Answer:   [facility]    Order Specific Question:   Radiology Contrast Protocol - do NOT remove file path    Answer:   [email or web address]  . Urinalysis, Routine w reflex microscopic    Standing Status:   Future    Standing Expiration Date:   07/07/2019  . POCT urinalysis dipstick     Return in about 2 weeks (around 06/23/2019) for I will be probably scheduling him for cystoscopy with a stent once the CT is done. .    CC: Dr. Benny Lennert, Dr. Dicky Doe and Dr. Glori Bickers.      Irine Seal 06/09/2019 [phone]

## 2019-06-10 LAB — URINE CULTURE

## 2019-06-12 ENCOUNTER — Encounter (HOSPITAL_COMMUNITY): Payer: Medicaid Other

## 2019-06-15 ENCOUNTER — Telehealth: Payer: Self-pay

## 2019-06-15 NOTE — Telephone Encounter (Signed)
-----   Message from Irine Seal, MD sent at 06/14/2019  2:19 PM EST ----- Mx species.  No treatment.   Hopefully he will get his CT soon.  ----- Message ----- From: Dorisann Frames, RN Sent: 06/14/2019  10:04 AM EST To: Irine Seal, MD  Culture report

## 2019-06-15 NOTE — Telephone Encounter (Signed)
Pt called and was not home. Will try to call again tomorrow.

## 2019-06-16 NOTE — Telephone Encounter (Signed)
Called No answer.

## 2019-06-19 ENCOUNTER — Encounter (HOSPITAL_COMMUNITY)
Admission: RE | Admit: 2019-06-19 | Discharge: 2019-06-19 | Disposition: A | Discharge status: Home / Self Care | Payer: Medicaid Other | Source: Ambulatory Visit | Attending: Interventional Cardiology | Admitting: Interventional Cardiology

## 2019-06-19 ENCOUNTER — Other Ambulatory Visit: Payer: Self-pay

## 2019-06-19 VITALS — BP 90/62 | HR 83 | Temp 96.0°F | Ht 72.0 in | Wt 190.7 lb

## 2019-06-19 DIAGNOSIS — I213 ST elevation (STEMI) myocardial infarction of unspecified site: Secondary | ICD-10-CM | POA: Diagnosis not present

## 2019-06-19 DIAGNOSIS — Z955 Presence of coronary angioplasty implant and graft: Secondary | ICD-10-CM

## 2019-06-19 NOTE — Progress Notes (Signed)
Cardiac Individual Treatment Plan  Patient Details  Name: [name] MRN: [id number] Date of Birth: 21-Jan-1956 Referring Provider:     CARDIAC REHAB PHASE II ORIENTATION from 06/19/2019 in [facility]  Referring Provider  [name]      Initial Encounter Date:    CARDIAC REHAB PHASE II ORIENTATION from 06/19/2019 in [facility]  Date  06/19/19      Visit Diagnosis: ST elevation myocardial infarction (STEMI), unspecified artery ([place])  Status post coronary artery stent placement  Patient's Home Medications on Admission:  Current Outpatient Medications:  .  cholecalciferol (VITAMIN D3) 25 MCG (1000 UNIT) tablet, Take 1,000 Units by mouth daily., Disp: , Rfl:  .  sodium bicarbonate 650 MG tablet, Take 650 mg by mouth 3 (three) times daily., Disp: , Rfl:  .  allopurinol (ZYLOPRIM) 100 MG tablet, Take 100 mg by mouth daily. , Disp: , Rfl:  .  aspirin 81 MG chewable tablet, Chew 1 tablet (81 mg total) by mouth daily., Disp: 90 tablet, Rfl: 1 .  atorvastatin (LIPITOR) 80 MG tablet, Take 1 tablet (80 mg total) by mouth daily at 6 PM., Disp: 90 tablet, Rfl: 1 .  BIDIL 20-37.5 MG tablet, Take 1 tablet by mouth twice daily, Disp: 60 tablet, Rfl: 0 .  carvedilol (COREG) 25 MG tablet, Take 12.5 mg by mouth 2 (two) times daily with a meal. , Disp: , Rfl:  .  furosemide (LASIX) 40 MG tablet, Take 40 mg by mouth daily as needed for fluid or edema. , Disp: , Rfl:  .  nitroGLYCERIN (NITROSTAT) 0.4 MG SL tablet, Place 1 tablet (0.4 mg total) under the tongue every 5 (five) minutes as needed., Disp: 25 tablet, Rfl: 2 .  ticagrelor (BRILINTA) 90 MG TABS tablet, Take 1 tablet (90 mg total) by mouth 2 (two) times daily., Disp: 180 tablet, Rfl: 1  Past Medical History: Past Medical History:  Diagnosis Date  . CHF (congestive heart failure) ([place])   . CKD (chronic kidney disease) stage 4, GFR 15-29 ml/min ([facility])   . Heart attack ([place]) 03/15/2019  .  Hypertension 2002  . Kidney stones   . Left knee pain     Tobacco Use: Social History   Tobacco Use  Smoking Status Former Smoker  . Packs/day: 0.50  . Years: 40.00  . Pack years: 20.00  . Types: Cigarettes  . Quit date: 03/12/2019  . Years since quitting: 0.2  Smokeless Tobacco Never Used    Labs: Recent Review Scientist, physiological    Labs for ITP Cardiac and Pulmonary Rehab Latest Ref Rng & Units 05/28/2016 03/12/2019 03/13/2019   Cholestrol 0 - 200 mg/dL 204(H) 193 -   LDLCALC 0 - 99 mg/dL 121(H) 114(H) -   HDL >40 mg/dL 48 42 -   Trlycerides <150 mg/dL 173(H) 184(H) -   Hemoglobin A1c 4.8 - 5.6 % 5.0 - 5.4   TCO2 22 - 32 mmol/L - 20(L) -      Capillary Blood Glucose: Lab Results  Component Value Date   GLUCAP 115 (H) 04/04/2019   GLUCAP 168 (H) 03/12/2019   GLUCAP 92 05/19/2016     Exercise Target Goals: Exercise Program Goal: Individual exercise prescription set using results from initial 6 min walk test and THRR while considering  patient's activity barriers and safety.   Exercise Prescription Goal: Starting with aerobic activity 30 plus minutes a day, 3 days per week for initial exercise prescription. Provide home exercise prescription and guidelines that  participant acknowledges understanding prior to discharge.  Activity Barriers & Risk Stratification: Activity Barriers & Cardiac Risk Stratification - 06/19/19 1134      Activity Barriers & Cardiac Risk Stratification   Activity Barriers  Deconditioning   (L) knee problems. He needs a replacement.   Cardiac Risk Stratification  High       6 Minute Walk: 6 Minute Walk    Row Name 06/19/19 1132         6 Minute Walk   Phase  Initial     Distance  1200 feet     Walk Time  6 minutes     # of Rest Breaks  0     MPH  2.27     METS  2.74     RPE  10     Perceived Dyspnea   11     VO2 Peak  12.31     Symptoms  No     Resting HR  83 bpm     Resting BP  90/62     Resting Oxygen Saturation   99 %      Exercise Oxygen Saturation  during 6 min walk  96 %     Max Ex. HR  114 bpm     Max Ex. BP  128/78     2 Minute Post BP  90/78        Oxygen Initial Assessment:   Oxygen Re-Evaluation:   Oxygen Discharge (Final Oxygen Re-Evaluation):   Initial Exercise Prescription: Initial Exercise Prescription - 06/19/19 1100      Date of Initial Exercise RX and Referring Provider   Date  06/19/19    Referring Provider  [name]    Expected Discharge Date  09/16/19      Treadmill   MPH  1.3    Grade  0    Minutes  17    METs  1.99      Recumbant Elliptical   Level  1    RPM  40    Watts  28    Minutes  20    METs  2.2      Prescription Details   Frequency (times per week)  3    Duration  Progress to 30 minutes of continuous aerobic without signs/symptoms of physical distress      Intensity   THRR 40-80% of Max Heartrate  (531) [id number]    Ratings of Perceived Exertion  11-13    Perceived Dyspnea  0-4      Progression   Progression  Continue to progress workloads to maintain intensity without signs/symptoms of physical distress.      Resistance Training   Training Prescription  Yes    Weight  1    Reps  10-15       Perform Capillary Blood Glucose checks as needed.  Exercise Prescription Changes:   Exercise Comments:  Exercise Comments    Row Name 06/19/19 1147           Exercise Comments  This was patient orientation/assessment visit. He is looking forward to attending to continue to get stronger and to get mobility back to 100%.          Exercise Goals and Review:  Exercise Goals    Row Name 06/19/19 1146             Exercise Goals   Increase Physical Activity  Yes       Intervention  Provide advice, education, support and counseling  about physical activity/exercise needs.;Develop an individualized exercise prescription for aerobic and resistive training based on initial evaluation findings, risk stratification, comorbidities and participant's  personal goals.       Expected Outcomes  Long Term: Add in home exercise to make exercise part of routine and to increase amount of physical activity.;Short Term: Attend rehab on a regular basis to increase amount of physical activity.       Increase Strength and Stamina  Yes       Intervention  Provide advice, education, support and counseling about physical activity/exercise needs.;Develop an individualized exercise prescription for aerobic and resistive training based on initial evaluation findings, risk stratification, comorbidities and participant's personal goals.       Expected Outcomes  Short Term: Perform resistance training exercises routinely during rehab and add in resistance training at home;Long Term: Improve cardiorespiratory fitness, muscular endurance and strength as measured by increased METs and functional capacity (6MWT)       Able to understand and use rate of perceived exertion (RPE) scale  Yes       Intervention  Provide education and explanation on how to use RPE scale       Expected Outcomes  Short Term: Able to use RPE daily in rehab to express subjective intensity level;Long Term:  Able to use RPE to guide intensity level when exercising independently       Able to understand and use Dyspnea scale  Yes       Intervention  Provide education and explanation on how to use Dyspnea scale       Expected Outcomes  Short Term: Able to use Dyspnea scale daily in rehab to express subjective sense of shortness of breath during exertion;Long Term: Able to use Dyspnea scale to guide intensity level when exercising independently       Knowledge and understanding of Target Heart Rate Range (THRR)  Yes       Intervention  Provide education and explanation of THRR including how the numbers were predicted and where they are located for reference       Expected Outcomes  Short Term: Able to use daily as guideline for intensity in rehab;Long Term: Able to use THRR to govern intensity when  exercising independently       Able to check pulse independently  Yes       Intervention  Provide education and demonstration on how to check pulse in carotid and radial arteries.;Review the importance of being able to check your own pulse for safety during independent exercise       Expected Outcomes  Short Term: Able to explain why pulse checking is important during independent exercise;Long Term: Able to check pulse independently and accurately       Understanding of Exercise Prescription  Yes       Intervention  Provide education, explanation, and written materials on patient's individual exercise prescription       Expected Outcomes  Short Term: Able to explain program exercise prescription;Long Term: Able to explain home exercise prescription to exercise independently          Exercise Goals Re-Evaluation :    Discharge Exercise Prescription (Final Exercise Prescription Changes):   Nutrition:  Target Goals: Understanding of nutrition guidelines, daily intake of sodium 1500mg , cholesterol 200mg , calories 30% from fat and 7% or less from saturated fats, daily to have 5 or more servings of fruits and vegetables.  Biometrics: Pre Biometrics - 06/19/19 1148      Pre Biometrics  Height  6' (1.829 m)    Weight  190 lb 11.2 oz (86.5 kg)    Waist Circumference  40.5 inches    Hip Circumference  38 inches    Waist to Hip Ratio  1.07 %    BMI (Calculated)  25.86    Triceps Skinfold  6 mm    % Body Fat  22.9 %    Grip Strength  37.9 kg    Flexibility  0 in    Single Leg Stand  15.37 seconds        Nutrition Therapy Plan and Nutrition Goals:   Nutrition Assessments: Nutrition Assessments - 06/19/19 1151      MEDFICTS Scores   Pre Score  3       Nutrition Goals Re-Evaluation:   Nutrition Goals Discharge (Final Nutrition Goals Re-Evaluation):   Psychosocial: Target Goals: Acknowledge presence or absence of significant depression and/or stress, maximize coping  skills, provide positive support system. Participant is able to verbalize types and ability to use techniques and skills needed for reducing stress and depression.  Initial Review & Psychosocial Screening: Initial Psych Review & Screening - 06/19/19 1150      Initial Review   Current issues with  None Identified      Family Dynamics   Good Support System?  Yes      Barriers   Psychosocial barriers to participate in program  There are no identifiable barriers or psychosocial needs.      Screening Interventions   Interventions  Encouraged to exercise       Quality of Life Scores: Quality of Life - 06/19/19 1026      Quality of Life   Select  Quality of Life      Quality of Life Scores   Health/Function Pre  23.67 %    Socioeconomic Pre  25 %    Psych/Spiritual Pre  27.92 %    Family Pre  21.75 %    GLOBAL Pre  24.54 %      Scores of 19 and below usually indicate a poorer quality of life in these areas.  A difference of  2-3 points is a clinically meaningful difference.  A difference of 2-3 points in the total score of the Quality of Life Index has been associated with significant improvement in overall quality of life, self-image, physical symptoms, and general health in studies assessing change in quality of life.  PHQ-9: Recent Review Flowsheet Data    Depression screen [facility] 2/9 06/19/2019 04/11/2019 03/21/2019   Decreased Interest 0 0 0   Down, Depressed, Hopeless 0 0 0   PHQ - 2 Score 0 0 0   Altered sleeping 0 - -   Tired, decreased energy 0 - -   Change in appetite 0 - -   Feeling bad or failure about yourself  0 - -   Trouble concentrating 0 - -   Moving slowly or fidgety/restless 0 - -   Suicidal thoughts 0 - -   PHQ-9 Score 0 - -     Interpretation of Total Score  Total Score Depression Severity:  1-4 = Minimal depression, 5-9 = Mild depression, 10-14 = Moderate depression, 15-19 = Moderately severe depression, 20-27 = Severe depression   Psychosocial  Evaluation and Intervention:   Psychosocial Re-Evaluation:   Psychosocial Discharge (Final Psychosocial Re-Evaluation):   Vocational Rehabilitation: Provide vocational rehab assistance to qualifying candidates.   Vocational Rehab Evaluation & Intervention: Vocational Rehab - 06/19/19 [id number]  Initial Vocational Rehab Evaluation & Intervention   Assessment shows need for Vocational Rehabilitation  No       Education: Education Goals: Education classes will be provided on a weekly basis, covering required topics. Participant will state understanding/return demonstration of topics presented.  Learning Barriers/Preferences: Learning Barriers/Preferences - 06/19/19 1151      Learning Barriers/Preferences   Learning Barriers  None    Learning Preferences  Individual Instruction;Group Instruction;Skilled Demonstration       Education Topics: Hypertension, Hypertension Reduction -Define heart disease and high blood pressure. Discus how high blood pressure affects the body and ways to reduce high blood pressure.   Exercise and Your Heart -Discuss why it is important to exercise, the FITT principles of exercise, normal and abnormal responses to exercise, and how to exercise safely.   Angina -Discuss definition of angina, causes of angina, treatment of angina, and how to decrease risk of having angina.   Cardiac Medications -Review what the following cardiac medications are used for, how they affect the body, and side effects that may occur when taking the medications.  Medications include Aspirin, Beta blockers, calcium channel blockers, ACE Inhibitors, angiotensin receptor blockers, diuretics, digoxin, and antihyperlipidemics.   Congestive Heart Failure -Discuss the definition of CHF, how to live with CHF, the signs and symptoms of CHF, and how keep track of weight and sodium intake.   Heart Disease and Intimacy -Discus the effect sexual activity has on the heart, how  changes occur during intimacy as we age, and safety during sexual activity.   Smoking Cessation / COPD -Discuss different methods to quit smoking, the health benefits of quitting smoking, and the definition of COPD.   Nutrition I: Fats -Discuss the types of cholesterol, what cholesterol does to the heart, and how cholesterol levels can be controlled.   Nutrition II: Labels -Discuss the different components of food labels and how to read food label   Heart Parts/Heart Disease and PAD -Discuss the anatomy of the heart, the pathway of blood circulation through the heart, and these are affected by heart disease.   Stress I: Signs and Symptoms -Discuss the causes of stress, how stress may lead to anxiety and depression, and ways to limit stress.   Stress II: Relaxation -Discuss different types of relaxation techniques to limit stress.   Warning Signs of Stroke / TIA -Discuss definition of a stroke, what the signs and symptoms are of a stroke, and how to identify when someone is having stroke.   Knowledge Questionnaire Score: Knowledge Questionnaire Score - 06/19/19 1151      Knowledge Questionnaire Score   Pre Score  18/24       Core Components/Risk Factors/Patient Goals at Admission: Personal Goals and Risk Factors at Admission - 06/19/19 1151      Core Components/Risk Factors/Patient Goals on Admission    Weight Management  Weight Maintenance    Personal Goal Other  Yes    Personal Goal  Patient wants to get mobility back to 100% and to be able to maintain that level of strength.    Intervention  Atten CR 3 x week and to supplement with walking 2 x week at home.    Expected Outcomes  Reach expected outcomes       Core Components/Risk Factors/Patient Goals Review:    Core Components/Risk Factors/Patient Goals at Discharge (Final Review):    ITP Comments: ITP Comments    Row Name 06/19/19 1131           ITP  Comments  Patient is eager to get started. He is  coming by way of [facility].          Comments: Patient arrived for 1st visit/orientation/education at 0800.  Patient was referred to CR by [name] due to STEMI (I21.3) and Stent Placement (Z95.5). During orientation advised patient on arrival and appointment times what to wear, what to do before, during and after exercise. Reviewed attendance and class policy. Talked about inclement weather and class consultation policy. Pt is scheduled to return Cardiac Rehab on 06/26/19 at 0815. Pt was advised to come to class 15 minutes before class starts. Patient was also given instructions on meeting with the dietician and attending the Family Structure classes. Discussed RPE/Dpysnea scales. Discussed initial THR and how to find their radial and/or carotid pulse. Discussed the initial exercise prescription and how this effects their progress. Pt is eager to get started. Patient participated in warm up stretches followed by light weights and resistance bands. Patient was able to complete 6 minute walk test. Patient did not c/o pain during walk test. Patient was measured for the equipment. Discussed equipment safety with patient. Took patient pre-anthropometric measurements. Patient finished visit at 1030.

## 2019-06-19 NOTE — Progress Notes (Signed)
Daily Session Note  Patient Details  Name: [name] MRN: [id number] Date of Birth: [birth year] Referring Provider:     CARDIAC REHAB PHASE II ORIENTATION from 06/19/2019 in [facility]  Referring Provider  [name]      Encounter Date: 06/19/2019  Check In: Session Check In - 06/19/19 1130      Check-In   Supervising physician immediately available to respond to emergencies  See telemetry face sheet for immediately available MD    Location  [facility]    Staff Present  Russella Dar, MS, EP, [facility], Exercise Physiologist;Debra Wynetta Emery, RN, BSN    Virtual Visit  No    Medication changes reported      No    Fall or balance concerns reported     No    Tobacco Cessation  No Change    Warm-up and Cool-down  Performed as group-led instruction    Resistance Training Performed  Yes    VAD Patient?  No    PAD/SET Patient?  No      Pain Assessment   Currently in Pain?  No/denies    Pain Score  0-No pain    Multiple Pain Sites  No       Capillary Blood Glucose: No results found for this or any previous visit (from the past 24 hour(s)).    Social History   Tobacco Use  Smoking Status Former Smoker  . Packs/day: 0.50  . Years: 40.00  . Pack years: 20.00  . Types: Cigarettes  . Quit date: 03/12/2019  . Years since quitting: 0.2  Smokeless Tobacco Never Used    Goals Met:  Independence with exercise equipment Exercise tolerated well Personal goals reviewed No report of cardiac concerns or symptoms Strength training completed today  Goals Unmet:  Not Applicable  Comments: Check out: 1030   Dr. [name] is Medical Director for [place] and Pulmonary Rehab.

## 2019-06-19 NOTE — Progress Notes (Signed)
Cardiac/Pulmonary Rehab Medication Review by a Pharmacist  Does the patient  feel that his/her medications are working for him/her?  yes  Has the patient been experiencing any side effects to the medications prescribed?  no  Does the patient measure his/her own blood pressure or blood glucose at home?  yes - every day  Does the patient have any problems obtaining medications due to transportation or finances?   no  Understanding of regimen: excellent Understanding of indications: excellent Potential of compliance: excellent  Questions asked to Determine Patient Understanding of Medication Regimen:  1. What is the name of the medication?  2. What is the medication used for?  3. When should it be taken?  4. How much should be taken?  5. How will you take it?  6. What side effects should you report?  Understanding Defined as: Excellent: All questions above are correct Good: Questions 1-4 are correct Fair: Questions 1-2 are correct  Poor: 1 or none of the above questions are correct   Pharmacist comments: Overall, patient has great understanding of medications and high compliance.  He has no further questions regarding medications.   Margot Ables, PharmD Clinical Pharmacist 06/19/2019 8:58 AM

## 2019-06-22 ENCOUNTER — Ambulatory Visit (HOSPITAL_COMMUNITY): Payer: Medicaid Other

## 2019-06-23 ENCOUNTER — Ambulatory Visit: Payer: Medicaid Other | Admitting: Urology

## 2019-06-26 ENCOUNTER — Other Ambulatory Visit: Payer: Self-pay | Admitting: Urology

## 2019-06-26 ENCOUNTER — Other Ambulatory Visit: Payer: Self-pay

## 2019-06-26 ENCOUNTER — Other Ambulatory Visit: Payer: Self-pay | Admitting: Family Medicine

## 2019-06-26 ENCOUNTER — Encounter (HOSPITAL_COMMUNITY)
Admission: RE | Admit: 2019-06-26 | Discharge: 2019-06-26 | Disposition: A | Discharge status: Home / Self Care | Payer: Medicaid Other | Source: Ambulatory Visit | Attending: Interventional Cardiology | Admitting: Interventional Cardiology

## 2019-06-26 DIAGNOSIS — N200 Calculus of kidney: Secondary | ICD-10-CM

## 2019-06-26 DIAGNOSIS — I213 ST elevation (STEMI) myocardial infarction of unspecified site: Secondary | ICD-10-CM | POA: Diagnosis not present

## 2019-06-26 NOTE — Progress Notes (Signed)
Daily Session Note  Patient Details  Name: [name] MRN: [id number] Date of Birth: March 26, 1956 Referring Provider:     CARDIAC REHAB PHASE II ORIENTATION from 06/19/2019 in [place]  Referring Provider  [name]      Encounter Date: 06/26/2019  Check In: Session Check In - 06/26/19 0815      Check-In   Supervising physician immediately available to respond to emergencies  See telemetry face sheet for immediately available MD    Location  [facility]    Staff Present  Aundra Dubin, RN, BSN;Other    Virtual Visit  No    Medication changes reported      No    Fall or balance concerns reported     No    Tobacco Cessation  No Change    Warm-up and Cool-down  Performed as group-led instruction    Resistance Training Performed  Yes    VAD Patient?  No    PAD/SET Patient?  No      Pain Assessment   Currently in Pain?  No/denies    Pain Score  0-No pain    Multiple Pain Sites  No       Capillary Blood Glucose: No results found for this or any previous visit (from the past 24 hour(s)).    Social History   Tobacco Use  Smoking Status Former Smoker  . Packs/day: 0.50  . Years: 40.00  . Pack years: 20.00  . Types: Cigarettes  . Quit date: 03/12/2019  . Years since quitting: 0.2  Smokeless Tobacco Never Used    Goals Met:  Independence with exercise equipment Exercise tolerated well Personal goals reviewed Strength training completed today  Goals Unmet:  Not Applicable  Comments: check out 9:15   Dr. [name] is Medical Director for [place] and Pulmonary Rehab.

## 2019-06-28 ENCOUNTER — Other Ambulatory Visit: Payer: Self-pay

## 2019-06-28 ENCOUNTER — Encounter (HOSPITAL_COMMUNITY): Payer: Medicaid Other | Admitting: Internal Medicine

## 2019-06-28 ENCOUNTER — Encounter (HOSPITAL_COMMUNITY): Payer: Medicaid Other

## 2019-06-28 ENCOUNTER — Ambulatory Visit (HOSPITAL_COMMUNITY)
Admission: RE | Admit: 2019-06-28 | Discharge: 2019-06-28 | Disposition: A | Discharge status: Home / Self Care | Payer: Medicaid Other | Source: Ambulatory Visit | Attending: Internal Medicine | Admitting: Internal Medicine

## 2019-06-28 ENCOUNTER — Encounter (HOSPITAL_COMMUNITY): Payer: Self-pay | Admitting: Internal Medicine

## 2019-06-28 VITALS — BP 102/68 | HR 91 | Wt 195.0 lb

## 2019-06-28 DIAGNOSIS — Z8249 Family history of ischemic heart disease and other diseases of the circulatory system: Secondary | ICD-10-CM | POA: Insufficient documentation

## 2019-06-28 DIAGNOSIS — I2510 Atherosclerotic heart disease of native coronary artery without angina pectoris: Secondary | ICD-10-CM

## 2019-06-28 DIAGNOSIS — Z7982 Long term (current) use of aspirin: Secondary | ICD-10-CM | POA: Diagnosis not present

## 2019-06-28 DIAGNOSIS — Z87891 Personal history of nicotine dependence: Secondary | ICD-10-CM | POA: Diagnosis not present

## 2019-06-28 DIAGNOSIS — Z79899 Other long term (current) drug therapy: Secondary | ICD-10-CM | POA: Insufficient documentation

## 2019-06-28 DIAGNOSIS — Z955 Presence of coronary angioplasty implant and graft: Secondary | ICD-10-CM | POA: Insufficient documentation

## 2019-06-28 DIAGNOSIS — I5022 Chronic systolic (congestive) heart failure: Secondary | ICD-10-CM | POA: Diagnosis not present

## 2019-06-28 DIAGNOSIS — I252 Old myocardial infarction: Secondary | ICD-10-CM | POA: Insufficient documentation

## 2019-06-28 DIAGNOSIS — N184 Chronic kidney disease, stage 4 (severe): Secondary | ICD-10-CM | POA: Insufficient documentation

## 2019-06-28 DIAGNOSIS — I130 Hypertensive heart and chronic kidney disease with heart failure and stage 1 through stage 4 chronic kidney disease, or unspecified chronic kidney disease: Secondary | ICD-10-CM | POA: Diagnosis not present

## 2019-06-28 LAB — BASIC METABOLIC PANEL
Anion gap: 9 (ref 5–15)
BUN: 31 mg/dL — ABNORMAL HIGH (ref 8–23)
CO2: 23 mmol/L (ref 22–32)
Calcium: 9.9 mg/dL (ref 8.9–10.3)
Chloride: 113 mmol/L — ABNORMAL HIGH (ref 98–111)
Creatinine, Ser: 2.76 mg/dL — ABNORMAL HIGH (ref 0.61–1.24)
GFR calc Af Amer: 27 mL/min — ABNORMAL LOW (ref >60)
GFR calc non Af Amer: 24 mL/min — ABNORMAL LOW (ref >60)
Glucose, Bld: 108 mg/dL — ABNORMAL HIGH (ref 70–99)
Potassium: 3.9 mmol/L (ref 3.5–5.1)
Sodium: 145 mmol/L (ref 135–145)

## 2019-06-28 LAB — CBC
HCT: 32.3 % — ABNORMAL LOW (ref 39.0–52.0)
Hemoglobin: 10.3 g/dL — ABNORMAL LOW (ref 13.0–17.0)
MCH: 30.4 pg (ref 26.0–34.0)
MCHC: 31.9 g/dL (ref 30.0–36.0)
MCV: 95.3 fL (ref 80.0–100.0)
Platelets: 248 10*3/uL (ref 150–400)
RBC: 3.39 MIL/uL — ABNORMAL LOW (ref 4.22–5.81)
RDW: 15.4 % (ref 11.5–15.5)
WBC: 5.3 10*3/uL (ref 4.0–10.5)
nRBC: 0 % (ref 0.0–0.2)

## 2019-06-28 LAB — BRAIN NATRIURETIC PEPTIDE: B Natriuretic Peptide: 452.8 pg/mL — ABNORMAL HIGH (ref 0.0–100.0)

## 2019-06-28 NOTE — Progress Notes (Signed)
ADVANCED HF CLINIC CONSULT NOTE  Referring Physician: DR. Burt Knack Primary Care: Dr. Orson Ape   HPI:  64 y/o male smoker with HTN, CKD IV, CAD and systolic HF. Referred by Dr. Burt Knack for further management of systolic HF.  Experienced a large anterolateral STEMI on 03/12/19. Taken to cath lab.   Cath 03/12/2019  Total occlusion of the mid LAD treated with angioplasty and stenting using a 4.0 x 22 Onyx deployed at 14 atm. 0% stenosis was noted post procedure and TIMI grade III flow increased from 0 to 3. The final angiographic images of the LAD demonstrates apical occlusion due to embolus.  Luminal irregularities in the left main but no agreeable stenosis.  Large circumflex with 3 obtuse marginals. The first marginal is large and has a very distal 90% stenosis in 2 branches. There is a trifurcation distally in the first marginal.  RCA Moderate diffuse luminal irregularities but widely patent.  Mid anterior wall to apical akinesis. EF 30 to 35% acutely. LVEDP 31 mmHg.   Post cath had low BP and shock but recovered. EF 20% by echo. No significant MR. Creatinine post cath 3.1-3.4  Says he feels great. Going to [facility]. Exercising without SOB or CP. Lives with his elderly mother. Takes lasix 1-2x/week. No orthopnea or PND. BP improved. No dizziness.   Past Medical History:  Diagnosis Date  . CHF (congestive heart failure) ([place])   . CKD (chronic kidney disease) stage 4, GFR 15-29 ml/min ([facility])   . Heart attack ([place]) 03/15/2019  . Hypertension 2002  . Kidney stones   . Left knee pain     Current Outpatient Medications  Medication Sig Dispense Refill  . allopurinol (ZYLOPRIM) 100 MG tablet Take 100 mg by mouth daily.     Marland Kitchen aspirin 81 MG chewable tablet Chew 1 tablet (81 mg total) by mouth daily. 90 tablet 1  . atorvastatin (LIPITOR) 80 MG tablet Take 1 tablet (80 mg total) by mouth daily at 6 PM. 90 tablet 1  . BIDIL 20-37.5 MG tablet Take 1 tablet by mouth twice daily 60  tablet 0  . carvedilol (COREG) 25 MG tablet Take 12.5 mg by mouth 2 (two) times daily with a meal.     . cholecalciferol (VITAMIN D3) 25 MCG (1000 UNIT) tablet Take 1,000 Units by mouth daily.    . furosemide (LASIX) 40 MG tablet Take 40 mg by mouth daily as needed for fluid or edema.     . nitroGLYCERIN (NITROSTAT) 0.4 MG SL tablet Place 1 tablet (0.4 mg total) under the tongue every 5 (five) minutes as needed. 25 tablet 2  . sodium bicarbonate 650 MG tablet Take 650 mg by mouth 3 (three) times daily.    . ticagrelor (BRILINTA) 90 MG TABS tablet Take 1 tablet (90 mg total) by mouth 2 (two) times daily. 180 tablet 1   No current facility-administered medications for this encounter.    No Known Allergies    Social History   Socioeconomic History  . Marital status: Single    Spouse name: Not on file  . Number of children: Not on file  . Years of education: Not on file  . Highest education level: Not on file  Occupational History  . Occupation: retired  Tobacco Use  . Smoking status: Former Smoker    Packs/day: 0.50    Years: 40.00    Pack years: 20.00    Types: Cigarettes    Quit date: 03/12/2019    Years since quitting:  0.2  . Smokeless tobacco: Never Used  Substance and Sexual Activity  . Alcohol use: No  . Drug use: No  . Sexual activity: Not on file  Other Topics Concern  . Not on file  Social History Narrative  . Not on file   Social Determinants of Health   Financial Resource Strain:   . Difficulty of Paying Living Expenses: Not on file  Food Insecurity:   . Worried About Charity fundraiser in the Last Year: Not on file  . Ran Out of Food in the Last Year: Not on file  Transportation Needs:   . Lack of Transportation (Medical): Not on file  . Lack of Transportation (Non-Medical): Not on file  Physical Activity:   . Days of Exercise per Week: Not on file  . Minutes of Exercise per Session: Not on file  Stress:   . Feeling of Stress : Not on file  Social  Connections: Unknown  . Frequency of Communication with Friends and Family: Patient refused  . Frequency of Social Gatherings with Friends and Family: Patient refused  . Attends Religious Services: Patient refused  . Active Member of Clubs or Organizations: Patient refused  . Attends Archivist Meetings: Patient refused  . Marital Status: Patient refused  Intimate Partner Violence: Unknown  . Fear of Current or Ex-Partner: Patient refused  . Emotionally Abused: Patient refused  . Physically Abused: Patient refused  . Sexually Abused: Patient refused      Family History  Problem Relation Age of Onset  . Heart failure Mother   . Heart disease Mother   . Cancer Mother        breast  . Heart attack Father   . Hypertension Father   . Heart disease Father     Vitals:   06/28/19 0924  BP: 102/68  Pulse: 91  SpO2: 98%  Weight: 88.5 kg (195 lb)    PHYSICAL EXAM: General:  Well appearing. No respiratory difficulty HEENT: normal Neck: supple. no JVD. Carotids 2+ bilat; no bruits. No lymphadenopathy or thryomegaly appreciated. Cor: PMI nondisplaced. Regular rate & rhythm. No rubs, gallops or murmurs. Lungs: clear with decreased BS Abdomen: soft, nontender, nondistended. No hepatosplenomegaly. No bruits or masses. Good bowel sounds. Extremities: no cyanosis, clubbing, rash, trace - 1+ pedal edema Neuro: alert & orientedx3, cranial nerves grossly intact. moves all 4 extremities w/o difficulty. Affect pleasant   ECG: NSR 90 anterolateral q waves with persistent ST elevation. Marland Kitchendbppr   ASSESSMENT & PLAN:  1. CAD s/p acute anterolateral STEMI 03/12/19 - s/p DES LAD - EF 20%. Given ECG findings I do not suspect significant recovery - No s/s ischemia - Continue DAPT, statin - Continue CR  2. Chronic systolic HF - EF 84%. Given ECG findings I do not suspect significant recovery - Improved NYHA II - Volume status slightlyy elevated. Take lasix 40 MWF - Continue Bidil 1  tab TID and carvedilol 12.5 bid. Will not increase today with soft BP and CKD IV - Not candidate for Entresto, SGLT2i or spiro with CKD IV. Consider ivabradine as needed - Will get ECHO at [facility]. If EF <= 35% Will need ICD   3. CKD IV - due to HTN nephropathy - Creatinine stable around 3.0 (GFR 22) - Follows with Dr. Hinda Lenis - Recheck today  4. HTN - BP runs low  5. Tobacco use - congratulated on cessation  Glori Bickers, MD  9:38 AM

## 2019-06-28 NOTE — Patient Instructions (Signed)
Routine lab work today. Will notify you of abnormal results  Take extra lasix as needed for fluid and edema.  Follow up with Dr.Bensimhon in 3-4 months.  Echo at [facility] on Monday.

## 2019-06-28 NOTE — Addendum Note (Signed)
Encounter addended by: Harvie Junior, CMA on: 06/28/2019 9:49 AM  Actions taken: Order list changed, Diagnosis association updated, Clinical Note Signed, Charge Capture section accepted

## 2019-06-30 ENCOUNTER — Encounter (HOSPITAL_COMMUNITY)
Admission: RE | Admit: 2019-06-30 | Discharge: 2019-06-30 | Disposition: A | Discharge status: Home / Self Care | Payer: Medicaid Other | Source: Ambulatory Visit | Attending: Interventional Cardiology | Admitting: Interventional Cardiology

## 2019-06-30 ENCOUNTER — Other Ambulatory Visit: Payer: Self-pay

## 2019-06-30 DIAGNOSIS — I213 ST elevation (STEMI) myocardial infarction of unspecified site: Secondary | ICD-10-CM

## 2019-06-30 DIAGNOSIS — Z955 Presence of coronary angioplasty implant and graft: Secondary | ICD-10-CM

## 2019-06-30 NOTE — Progress Notes (Signed)
Daily Session Note  Patient Details  Name: [name] MRN: [id number] Date of Birth: 30-Aug-1955 Referring Provider:     CARDIAC REHAB PHASE II ORIENTATION from 06/19/2019 in [place]  Referring Provider  [name]      Encounter Date: 06/30/2019  Check In: Session Check In - 06/30/19 0815      Check-In   Supervising physician immediately available to respond to emergencies  See telemetry face sheet for immediately available MD    Location  [facility]    Staff Present  Aundra Dubin, RN, BSN;Other    Virtual Visit  No    Medication changes reported      Yes    Comments  Changed Lasix 19m MWF    Fall or balance concerns reported     No    Tobacco Cessation  No Change    Warm-up and Cool-down  Performed as group-led instruction    Resistance Training Performed  Yes    VAD Patient?  No    PAD/SET Patient?  No      Pain Assessment   Currently in Pain?  No/denies    Pain Score  0-No pain    Multiple Pain Sites  No       Capillary Blood Glucose: No results found for this or any previous visit (from the past 24 hour(s)).    Social History   Tobacco Use  Smoking Status Former Smoker  . Packs/day: 0.50  . Years: 40.00  . Pack years: 20.00  . Types: Cigarettes  . Quit date: 03/12/2019  . Years since quitting: 0.3  Smokeless Tobacco Never Used    Goals Met:  Independence with exercise equipment Exercise tolerated well Personal goals reviewed No report of cardiac concerns or symptoms Strength training completed today  Goals Unmet:  Not Applicable  Comments: check out 9:15   Dr. SKate Sableis Medical Director for [facility].

## 2019-07-03 ENCOUNTER — Other Ambulatory Visit: Payer: Self-pay

## 2019-07-03 ENCOUNTER — Encounter (HOSPITAL_COMMUNITY)
Admission: RE | Admit: 2019-07-03 | Discharge: 2019-07-03 | Disposition: A | Discharge status: Home / Self Care | Payer: Medicare Other | Source: Ambulatory Visit | Attending: Interventional Cardiology | Admitting: Interventional Cardiology

## 2019-07-03 DIAGNOSIS — Z955 Presence of coronary angioplasty implant and graft: Secondary | ICD-10-CM

## 2019-07-03 DIAGNOSIS — I213 ST elevation (STEMI) myocardial infarction of unspecified site: Secondary | ICD-10-CM | POA: Insufficient documentation

## 2019-07-03 NOTE — Progress Notes (Signed)
Daily Session Note  Patient Details  Name: [name] MRN: [id number] Date of Birth: 13-Nov-1955 Referring Provider:     CARDIAC REHAB PHASE II ORIENTATION from 06/19/2019 in [place]  Referring Provider  [name]      Encounter Date: 07/03/2019  Check In: Session Check In - 07/03/19 0815      Check-In   Supervising physician immediately available to respond to emergencies  See telemetry face sheet for immediately available MD    Location  [facility]    Staff Present  Aundra Dubin, RN, BSN;Diane Coad, MS, EP, [facility], Exercise Physiologist    Virtual Visit  No    Medication changes reported      No    Fall or balance concerns reported     No    Tobacco Cessation  No Change    Warm-up and Cool-down  Performed as group-led instruction    Resistance Training Performed  Yes    VAD Patient?  No    PAD/SET Patient?  No      Pain Assessment   Currently in Pain?  Yes    Pain Score  7     Pain Location  Knee    Pain Orientation  Right    Pain Descriptors / Indicators  Aching    Pain Type  Chronic pain    Pain Radiating Towards  No radiation.    Pain Onset  In the past 7 days    Pain Frequency  Constant    Aggravating Factors   Weight bearing and certain postions increase pain.    Pain Relieving Factors  Rest and change in position.    Effect of Pain on Daily Activities  Limits activity and mobility at times.    Multiple Pain Sites  No       Capillary Blood Glucose: No results found for this or any previous visit (from the past 24 hour(s)).    Social History   Tobacco Use  Smoking Status Former Smoker  . Packs/day: 0.50  . Years: 40.00  . Pack years: 20.00  . Types: Cigarettes  . Quit date: 03/12/2019  . Years since quitting: 0.3  Smokeless Tobacco Never Used    Goals Met:  Independence with exercise equipment Exercise tolerated well Queuing for purse lip breathing No report of cardiac concerns or symptoms  Goals  Unmet:  Not Applicable  Comments: Check out 915.   Dr. [name] is Medical Director for [facility] and [facility] Re[facility].

## 2019-07-04 ENCOUNTER — Ambulatory Visit (HOSPITAL_COMMUNITY): Payer: Medicare Other

## 2019-07-05 ENCOUNTER — Emergency Department (HOSPITAL_COMMUNITY)
Admission: EM | Admit: 2019-07-05 | Discharge: 2019-07-05 | Disposition: A | Discharge status: Home / Self Care | Payer: Medicare Other | Attending: Emergency Medicine | Admitting: Emergency Medicine

## 2019-07-05 ENCOUNTER — Encounter (HOSPITAL_COMMUNITY): Payer: Self-pay | Admitting: *Deleted

## 2019-07-05 ENCOUNTER — Other Ambulatory Visit: Payer: Self-pay

## 2019-07-05 ENCOUNTER — Emergency Department (HOSPITAL_COMMUNITY): Payer: Medicare Other

## 2019-07-05 ENCOUNTER — Telehealth (HOSPITAL_COMMUNITY): Payer: Self-pay

## 2019-07-05 ENCOUNTER — Ambulatory Visit (HOSPITAL_BASED_OUTPATIENT_CLINIC_OR_DEPARTMENT_OTHER)
Admission: RE | Admit: 2019-07-05 | Discharge: 2019-07-05 | Disposition: A | Discharge status: Home / Self Care | Payer: Medicare Other | Source: Ambulatory Visit | Attending: Internal Medicine | Admitting: Internal Medicine

## 2019-07-05 ENCOUNTER — Encounter (HOSPITAL_COMMUNITY)
Admission: RE | Admit: 2019-07-05 | Discharge: 2019-07-05 | Disposition: A | Discharge status: Home / Self Care | Payer: Medicare Other | Source: Ambulatory Visit | Attending: Interventional Cardiology | Admitting: Interventional Cardiology

## 2019-07-05 DIAGNOSIS — N184 Chronic kidney disease, stage 4 (severe): Secondary | ICD-10-CM | POA: Insufficient documentation

## 2019-07-05 DIAGNOSIS — Z89422 Acquired absence of other left toe(s): Secondary | ICD-10-CM | POA: Diagnosis not present

## 2019-07-05 DIAGNOSIS — I130 Hypertensive heart and chronic kidney disease with heart failure and stage 1 through stage 4 chronic kidney disease, or unspecified chronic kidney disease: Secondary | ICD-10-CM | POA: Diagnosis not present

## 2019-07-05 DIAGNOSIS — I213 ST elevation (STEMI) myocardial infarction of unspecified site: Secondary | ICD-10-CM | POA: Diagnosis not present

## 2019-07-05 DIAGNOSIS — Z955 Presence of coronary angioplasty implant and graft: Secondary | ICD-10-CM

## 2019-07-05 DIAGNOSIS — R0602 Shortness of breath: Secondary | ICD-10-CM | POA: Diagnosis present

## 2019-07-05 DIAGNOSIS — I509 Heart failure, unspecified: Secondary | ICD-10-CM

## 2019-07-05 DIAGNOSIS — I5022 Chronic systolic (congestive) heart failure: Secondary | ICD-10-CM

## 2019-07-05 DIAGNOSIS — Z87891 Personal history of nicotine dependence: Secondary | ICD-10-CM | POA: Insufficient documentation

## 2019-07-05 LAB — BASIC METABOLIC PANEL
Anion gap: 9 (ref 5–15)
BUN: 25 mg/dL — ABNORMAL HIGH (ref 8–23)
CO2: 24 mmol/L (ref 22–32)
Calcium: 10 mg/dL (ref 8.9–10.3)
Chloride: 110 mmol/L (ref 98–111)
Creatinine, Ser: 2.73 mg/dL — ABNORMAL HIGH (ref 0.61–1.24)
GFR calc Af Amer: 28 mL/min — ABNORMAL LOW (ref >60)
GFR calc non Af Amer: 24 mL/min — ABNORMAL LOW (ref >60)
Glucose, Bld: 107 mg/dL — ABNORMAL HIGH (ref 70–99)
Potassium: 3.9 mmol/L (ref 3.5–5.1)
Sodium: 143 mmol/L (ref 135–145)

## 2019-07-05 LAB — CBC
HCT: 35.5 % — ABNORMAL LOW (ref 39.0–52.0)
Hemoglobin: 11.4 g/dL — ABNORMAL LOW (ref 13.0–17.0)
MCH: 30.8 pg (ref 26.0–34.0)
MCHC: 32.1 g/dL (ref 30.0–36.0)
MCV: 95.9 fL (ref 80.0–100.0)
Platelets: 248 10*3/uL (ref 150–400)
RBC: 3.7 MIL/uL — ABNORMAL LOW (ref 4.22–5.81)
RDW: 15.9 % — ABNORMAL HIGH (ref 11.5–15.5)
WBC: 6.4 10*3/uL (ref 4.0–10.5)
nRBC: 0 % (ref 0.0–0.2)

## 2019-07-05 LAB — TROPONIN I (HIGH SENSITIVITY)
Troponin I (High Sensitivity): 20 ng/L — ABNORMAL HIGH (ref <18)
Troponin I (High Sensitivity): 20 ng/L — ABNORMAL HIGH (ref <18)

## 2019-07-05 LAB — BRAIN NATRIURETIC PEPTIDE: B Natriuretic Peptide: 792 pg/mL — ABNORMAL HIGH (ref 0.0–100.0)

## 2019-07-05 NOTE — Progress Notes (Addendum)
Cardiac Individual Treatment Plan  Patient Details  Name: [name] MRN: [id number] Date of Birth: 09/15/1955 Referring Provider:     CARDIAC REHAB PHASE II ORIENTATION from 06/19/2019 in [place]  Referring Provider  [name]      Initial Encounter Date:    CARDIAC REHAB PHASE II ORIENTATION from 06/19/2019 in [place]  Date  06/19/19      Visit Diagnosis: ST elevation myocardial infarction (STEMI), unspecified artery ([place])  Status post coronary artery stent placement  Patient's Home Medications on Admission:  Current Outpatient Medications:  .  allopurinol (ZYLOPRIM) 100 MG tablet, Take 100 mg by mouth daily. , Disp: , Rfl:  .  aspirin 81 MG chewable tablet, Chew 1 tablet (81 mg total) by mouth daily., Disp: 90 tablet, Rfl: 1 .  atorvastatin (LIPITOR) 80 MG tablet, Take 1 tablet (80 mg total) by mouth daily at 6 PM., Disp: 90 tablet, Rfl: 1 .  BIDIL 20-37.5 MG tablet, Take 1 tablet by mouth twice daily, Disp: 60 tablet, Rfl: 0 .  carvedilol (COREG) 25 MG tablet, Take 12.5 mg by mouth 2 (two) times daily with a meal. , Disp: , Rfl:  .  cholecalciferol (VITAMIN D3) 25 MCG (1000 UNIT) tablet, Take 1,000 Units by mouth daily., Disp: , Rfl:  .  furosemide (LASIX) 40 MG tablet, Take 40 mg by mouth daily as needed for fluid or edema. , Disp: , Rfl:  .  nitroGLYCERIN (NITROSTAT) 0.4 MG SL tablet, Place 1 tablet (0.4 mg total) under the tongue every 5 (five) minutes as needed., Disp: 25 tablet, Rfl: 2 .  sodium bicarbonate 650 MG tablet, Take 650 mg by mouth 3 (three) times daily., Disp: , Rfl:  .  ticagrelor (BRILINTA) 90 MG TABS tablet, Take 1 tablet (90 mg total) by mouth 2 (two) times daily., Disp: 180 tablet, Rfl: 1  Past Medical History: Past Medical History:  Diagnosis Date  . CHF (congestive heart failure) ([place])   . CKD (chronic kidney disease) stage 4, GFR 15-29 ml/min ([facility])   . Heart attack ([place]) 03/15/2019  .  Hypertension 2002  . Kidney stones   . Left knee pain     Tobacco Use: Social History   Tobacco Use  Smoking Status Former Smoker  . Packs/day: 0.50  . Years: 40.00  . Pack years: 20.00  . Types: Cigarettes  . Quit date: 03/12/2019  . Years since quitting: 0.3  Smokeless Tobacco Never Used    Labs: Recent Review Scientist, physiological    Labs for ITP Cardiac and Pulmonary Rehab Latest Ref Rng & Units 05/28/2016 03/12/2019 03/13/2019   Cholestrol 0 - 200 mg/dL 204(H) 193 -   LDLCALC 0 - 99 mg/dL 121(H) 114(H) -   HDL >40 mg/dL 48 42 -   Trlycerides <150 mg/dL 173(H) 184(H) -   Hemoglobin A1c 4.8 - 5.6 % 5.0 - 5.4   TCO2 22 - 32 mmol/L - 20(L) -      Capillary Blood Glucose: Lab Results  Component Value Date   GLUCAP 115 (H) 04/04/2019   GLUCAP 168 (H) 03/12/2019   GLUCAP 92 05/19/2016     Exercise Target Goals: Exercise Program Goal: Individual exercise prescription set using results from initial 6 min walk test and THRR while considering  patient's activity barriers and safety.   Exercise Prescription Goal: Starting with aerobic activity 30 plus minutes a day, 3 days per week for initial exercise prescription. Provide home exercise prescription and guidelines that  participant acknowledges understanding prior to discharge.  Activity Barriers & Risk Stratification: Activity Barriers & Cardiac Risk Stratification - 06/19/19 1134      Activity Barriers & Cardiac Risk Stratification   Activity Barriers  Deconditioning   (L) knee problems. He needs a replacement.   Cardiac Risk Stratification  High       6 Minute Walk: 6 Minute Walk    Row Name 06/19/19 1132         6 Minute Walk   Phase  Initial     Distance  1200 feet     Walk Time  6 minutes     # of Rest Breaks  0     MPH  2.27     METS  2.74     RPE  10     Perceived Dyspnea   11     VO2 Peak  12.31     Symptoms  No     Resting HR  83 bpm     Resting BP  90/62     Resting Oxygen Saturation   99 %      Exercise Oxygen Saturation  during 6 min walk  96 %     Max Ex. HR  114 bpm     Max Ex. BP  128/78     2 Minute Post BP  90/78        Oxygen Initial Assessment:   Oxygen Re-Evaluation:   Oxygen Discharge (Final Oxygen Re-Evaluation):   Initial Exercise Prescription: Initial Exercise Prescription - 06/19/19 1100      Date of Initial Exercise RX and Referring Provider   Date  06/19/19    Referring Provider  [name]    Expected Discharge Date  09/16/19      Treadmill   MPH  1.3    Grade  0    Minutes  17    METs  1.99      Recumbant Elliptical   Level  1    RPM  40    Watts  28    Minutes  20    METs  2.2      Prescription Details   Frequency (times per week)  3    Duration  Progress to 30 minutes of continuous aerobic without signs/symptoms of physical distress      Intensity   THRR 40-80% of Max Heartrate  [phone]    Ratings of Perceived Exertion  11-13    Perceived Dyspnea  0-4      Progression   Progression  Continue to progress workloads to maintain intensity without signs/symptoms of physical distress.      Resistance Training   Training Prescription  Yes    Weight  1    Reps  10-15       Perform Capillary Blood Glucose checks as needed.  Exercise Prescription Changes: Exercise Prescription Changes    Row Name 07/04/19 1500             Response to Exercise   Blood Pressure (Admit)  110/88       Blood Pressure (Exercise)  110/80       Blood Pressure (Exit)  104/84       Heart Rate (Admit)  90 bpm       Heart Rate (Exercise)  105 bpm       Heart Rate (Exit)  99 bpm       Rating of Perceived Exertion (Exercise)  10  Symptoms  Knee Pain 7/10       Duration  Continue with 30 min of aerobic exercise without signs/symptoms of physical distress.       Intensity  THRR unchanged         Progression   Progression  Continue to progress workloads to maintain intensity without signs/symptoms of physical distress.       Average METs  1.65          Resistance Training   Training Prescription  Yes       Weight  2       Reps  10-15         Treadmill   MPH  1.4       Grade  0       Minutes  17       METs  2.07         Recumbant Elliptical   Level  1       RPM  38       Watts  46       Minutes  22       METs  2.5         Home Exercise Plan   Plans to continue exercise at  Home (comment)       Frequency  Add 2 additional days to program exercise sessions.       Initial Home Exercises Provided  06/19/19          Exercise Comments: Exercise Comments    Row Name 06/19/19 1147 07/04/19 1531         Exercise Comments  This was patient orientation/assessment visit. He is looking forward to attending to continue to get stronger and to get mobility back to 100%.  This is patients 4th visit. He is coming regularily and was progressing. Will have to watch his knee pain and if the current exercise prescription needs to be ajusted to elivate his current knee pain and swelling.         Exercise Goals and Review: Exercise Goals    Row Name 06/19/19 1146             Exercise Goals   Increase Physical Activity  Yes       Intervention  Provide advice, education, support and counseling about physical activity/exercise needs.;Develop an individualized exercise prescription for aerobic and resistive training based on initial evaluation findings, risk stratification, comorbidities and participant's personal goals.       Expected Outcomes  Long Term: Add in home exercise to make exercise part of routine and to increase amount of physical activity.;Short Term: Attend rehab on a regular basis to increase amount of physical activity.       Increase Strength and Stamina  Yes       Intervention  Provide advice, education, support and counseling about physical activity/exercise needs.;Develop an individualized exercise prescription for aerobic and resistive training based on initial evaluation findings, risk stratification,  comorbidities and participant's personal goals.       Expected Outcomes  Short Term: Perform resistance training exercises routinely during rehab and add in resistance training at home;Long Term: Improve cardiorespiratory fitness, muscular endurance and strength as measured by increased METs and functional capacity (6MWT)       Able to understand and use rate of perceived exertion (RPE) scale  Yes       Intervention  Provide education and explanation on how to use RPE scale       Expected  Outcomes  Short Term: Able to use RPE daily in rehab to express subjective intensity level;Long Term:  Able to use RPE to guide intensity level when exercising independently       Able to understand and use Dyspnea scale  Yes       Intervention  Provide education and explanation on how to use Dyspnea scale       Expected Outcomes  Short Term: Able to use Dyspnea scale daily in rehab to express subjective sense of shortness of breath during exertion;Long Term: Able to use Dyspnea scale to guide intensity level when exercising independently       Knowledge and understanding of Target Heart Rate Range (THRR)  Yes       Intervention  Provide education and explanation of THRR including how the numbers were predicted and where they are located for reference       Expected Outcomes  Short Term: Able to use daily as guideline for intensity in rehab;Long Term: Able to use THRR to govern intensity when exercising independently       Able to check pulse independently  Yes       Intervention  Provide education and demonstration on how to check pulse in carotid and radial arteries.;Review the importance of being able to check your own pulse for safety during independent exercise       Expected Outcomes  Short Term: Able to explain why pulse checking is important during independent exercise;Long Term: Able to check pulse independently and accurately       Understanding of Exercise Prescription  Yes       Intervention  Provide  education, explanation, and written materials on patient's individual exercise prescription       Expected Outcomes  Short Term: Able to explain program exercise prescription;Long Term: Able to explain home exercise prescription to exercise independently          Exercise Goals Re-Evaluation : Exercise Goals Re-Evaluation    Row Name 07/04/19 1527             Exercise Goal Re-Evaluation   Exercise Goals Review  Increase Physical Activity;Increase Strength and Stamina       Comments  On patients last visit he c/o knee pain and swelling 7/10. We may have to switch the equipment that he is using if the TM and Ellipical continue to aggrivate his knee. Will cintinue to assess.       Expected Outcomes  To be able to progress as tolerated.           Discharge Exercise Prescription (Final Exercise Prescription Changes): Exercise Prescription Changes - 07/04/19 1500      Response to Exercise   Blood Pressure (Admit)  110/88    Blood Pressure (Exercise)  110/80    Blood Pressure (Exit)  104/84    Heart Rate (Admit)  90 bpm    Heart Rate (Exercise)  105 bpm    Heart Rate (Exit)  99 bpm    Rating of Perceived Exertion (Exercise)  10    Symptoms  Knee Pain 7/10    Duration  Continue with 30 min of aerobic exercise without signs/symptoms of physical distress.    Intensity  THRR unchanged      Progression   Progression  Continue to progress workloads to maintain intensity without signs/symptoms of physical distress.    Average METs  1.65      Resistance Training   Training Prescription  Yes    Weight  2  Reps  10-15      Treadmill   MPH  1.4    Grade  0    Minutes  17    METs  2.07      Recumbant Elliptical   Level  1    RPM  38    Watts  46    Minutes  22    METs  2.5      Home Exercise Plan   Plans to continue exercise at  Home (comment)    Frequency  Add 2 additional days to program exercise sessions.    Initial Home Exercises Provided  06/19/19       Nutrition:   Target Goals: Understanding of nutrition guidelines, daily intake of sodium 1500mg , cholesterol 200mg , calories 30% from fat and 7% or less from saturated fats, daily to have 5 or more servings of fruits and vegetables.  Biometrics: Pre Biometrics - 06/19/19 1148      Pre Biometrics   Height  6' (1.829 m)    Weight  86.5 kg    Waist Circumference  40.5 inches    Hip Circumference  38 inches    Waist to Hip Ratio  1.07 %    BMI (Calculated)  25.86    Triceps Skinfold  6 mm    % Body Fat  22.9 %    Grip Strength  37.9 kg    Flexibility  0 in    Single Leg Stand  15.37 seconds        Nutrition Therapy Plan and Nutrition Goals:   Nutrition Assessments: Nutrition Assessments - 06/19/19 1151      MEDFICTS Scores   Pre Score  3       Nutrition Goals Re-Evaluation:   Nutrition Goals Discharge (Final Nutrition Goals Re-Evaluation):   Psychosocial: Target Goals: Acknowledge presence or absence of significant depression and/or stress, maximize coping skills, provide positive support system. Participant is able to verbalize types and ability to use techniques and skills needed for reducing stress and depression.  Initial Review & Psychosocial Screening: Initial Psych Review & Screening - 06/19/19 1150      Initial Review   Current issues with  None Identified      Family Dynamics   Good Support System?  Yes      Barriers   Psychosocial barriers to participate in program  There are no identifiable barriers or psychosocial needs.      Screening Interventions   Interventions  Encouraged to exercise       Quality of Life Scores: Quality of Life - 06/19/19 1026      Quality of Life   Select  Quality of Life      Quality of Life Scores   Health/Function Pre  23.67 %    Socioeconomic Pre  25 %    Psych/Spiritual Pre  27.92 %    Family Pre  21.75 %    GLOBAL Pre  24.54 %      Scores of 19 and below usually indicate a poorer quality of life in these areas.  A  difference of  2-3 points is a clinically meaningful difference.  A difference of 2-3 points in the total score of the Quality of Life Index has been associated with significant improvement in overall quality of life, self-image, physical symptoms, and general health in studies assessing change in quality of life.  PHQ-9: Recent Review Flowsheet Data    Depression screen [facility] 2/9 06/19/2019 04/11/2019 03/21/2019   Decreased Interest  0 0 0   Down, Depressed, Hopeless 0 0 0   PHQ - 2 Score 0 0 0   Altered sleeping 0 - -   Tired, decreased energy 0 - -   Change in appetite 0 - -   Feeling bad or failure about yourself  0 - -   Trouble concentrating 0 - -   Moving slowly or fidgety/restless 0 - -   Suicidal thoughts 0 - -   PHQ-9 Score 0 - -     Interpretation of Total Score  Total Score Depression Severity:  1-4 = Minimal depression, 5-9 = Mild depression, 10-14 = Moderate depression, 15-19 = Moderately severe depression, 20-27 = Severe depression   Psychosocial Evaluation and Intervention:   Psychosocial Re-Evaluation: Psychosocial Re-Evaluation    Row Name 07/05/19 1026             Psychosocial Re-Evaluation   Current issues with  None Identified       Comments  Patient's initial QOL score ws 24.54 and his PHQ-9 score was 0 with no psychosocial issued identified.       Expected Outcomes  Patient will have no psychosocial issued identified at discharge.       Interventions  Stress management education;Encouraged to attend Cardiac Rehabilitation for the exercise;Relaxation education       Continue Psychosocial Services   No Follow up required          Psychosocial Discharge (Final Psychosocial Re-Evaluation): Psychosocial Re-Evaluation - 07/05/19 1026      Psychosocial Re-Evaluation   Current issues with  None Identified    Comments  Patient's initial QOL score ws 24.54 and his PHQ-9 score was 0 with no psychosocial issued identified.    Expected Outcomes  Patient will have  no psychosocial issued identified at discharge.    Interventions  Stress management education;Encouraged to attend Cardiac Rehabilitation for the exercise;Relaxation education    Continue Psychosocial Services   No Follow up required       Vocational Rehabilitation: Provide vocational rehab assistance to qualifying candidates.   Vocational Rehab Evaluation & Intervention: Vocational Rehab - 06/19/19 1151      Initial Vocational Rehab Evaluation & Intervention   Assessment shows need for Vocational Rehabilitation  No       Education: Education Goals: Education classes will be provided on a weekly basis, covering required topics. Participant will state understanding/return demonstration of topics presented.  Learning Barriers/Preferences: Learning Barriers/Preferences - 06/19/19 1151      Learning Barriers/Preferences   Learning Barriers  None    Learning Preferences  Individual Instruction;Group Instruction;Skilled Demonstration       Education Topics: Hypertension, Hypertension Reduction -Define heart disease and high blood pressure. Discus how high blood pressure affects the body and ways to reduce high blood pressure.   Exercise and Your Heart -Discuss why it is important to exercise, the FITT principles of exercise, normal and abnormal responses to exercise, and how to exercise safely.   Angina -Discuss definition of angina, causes of angina, treatment of angina, and how to decrease risk of having angina.   Cardiac Medications -Review what the following cardiac medications are used for, how they affect the body, and side effects that may occur when taking the medications.  Medications include Aspirin, Beta blockers, calcium channel blockers, ACE Inhibitors, angiotensin receptor blockers, diuretics, digoxin, and antihyperlipidemics.   Congestive Heart Failure -Discuss the definition of CHF, how to live with CHF, the signs and symptoms of CHF, and how keep track  of  weight and sodium intake.   Heart Disease and Intimacy -Discus the effect sexual activity has on the heart, how changes occur during intimacy as we age, and safety during sexual activity.   Smoking Cessation / COPD -Discuss different methods to quit smoking, the health benefits of quitting smoking, and the definition of COPD.   Nutrition I: Fats -Discuss the types of cholesterol, what cholesterol does to the heart, and how cholesterol levels can be controlled.   Nutrition II: Labels -Discuss the different components of food labels and how to read food label   Heart Parts/Heart Disease and PAD -Discuss the anatomy of the heart, the pathway of blood circulation through the heart, and these are affected by heart disease.   Stress I: Signs and Symptoms -Discuss the causes of stress, how stress may lead to anxiety and depression, and ways to limit stress.   Stress II: Relaxation -Discuss different types of relaxation techniques to limit stress.   Warning Signs of Stroke / TIA -Discuss definition of a stroke, what the signs and symptoms are of a stroke, and how to identify when someone is having stroke.   Knowledge Questionnaire Score: Knowledge Questionnaire Score - 06/19/19 1151      Knowledge Questionnaire Score   Pre Score  18/24       Core Components/Risk Factors/Patient Goals at Admission: Personal Goals and Risk Factors at Admission - 06/19/19 1151      Core Components/Risk Factors/Patient Goals on Admission    Weight Management  Weight Maintenance    Personal Goal Other  Yes    Personal Goal  Patient wants to get mobility back to 100% and to be able to maintain that level of strength.    Intervention  Atten CR 3 x week and to supplement with walking 2 x week at home.    Expected Outcomes  Reach expected outcomes       Core Components/Risk Factors/Patient Goals Review:  Goals and Risk Factor Review    Row Name 07/05/19 1021             Core  Components/Risk Factors/Patient Goals Review   Personal Goals Review  Weight Management/Obesity;Other Get mobility back 100%; keep mobility.       Review  Patient has completed 5 sessions gaining 5 lbs since his initial visit. He is new to the program. He complained of SOB today during his session. He has an echocardiogram completed today. Dr. Letha Cape office was notified and patient was advised to go to the ED. Will continue to monitor patient's progress.       Expected Outcomes  Patient will continue to attend sessions and complete the program meeting his personal goals.          Core Components/Risk Factors/Patient Goals at Discharge (Final Review):  Goals and Risk Factor Review - 07/05/19 1021      Core Components/Risk Factors/Patient Goals Review   Personal Goals Review  Weight Management/Obesity;Other   Get mobility back 100%; keep mobility.   Review  Patient has completed 5 sessions gaining 5 lbs since his initial visit. He is new to the program. He complained of SOB today during his session. He has an echocardiogram completed today. Dr. Letha Cape office was notified and patient was advised to go to the ED. Will continue to monitor patient's progress.    Expected Outcomes  Patient will continue to attend sessions and complete the program meeting his personal goals.       ITP Comments: ITP Comments  [place] Name 06/19/19 1131           ITP Comments  Patient is eager to get started. He is coming by way of [facility].          Comments: ITP REVIEW Pt is making expected progress toward Cardiac Rehab goals after completing 5 sessions. Recommend continued exercise, life style modification, education, and increased stamina and strength.

## 2019-07-05 NOTE — Telephone Encounter (Signed)
Pt to be sent to ER at [facility] per Amy NP-C

## 2019-07-05 NOTE — Telephone Encounter (Signed)
Needs to go [facility] ED for evaluation.   Dontavian Marchi NP-C  9:34 AM

## 2019-07-05 NOTE — Telephone Encounter (Addendum)
Lindley Magnus, RN from Dr. Clayborne Dana office called back regarding patient's episode this morning of SOB, lower extremity edema and ECG tracing changes noted during cardiac rehab. She said she spoke with an NP Amy Clegg and she advised patient to be evaluated in the ED. Patient had already left the hospital after his echocardiogram was completed. Called patient and relayed this information. He voiced understanding and said he would go to the ED at [facility].

## 2019-07-05 NOTE — Discharge Instructions (Addendum)
You were evaluated in the Emergency Department and after careful evaluation, we did not find any emergent condition requiring admission or further testing in the hospital.  Your exam/testing today is overall reassuring.  Your symptoms seem to be due to a mild flare of your heart failure.  Please talk to your cardiologist regarding your Lasix dosing.  Please return to the Emergency Department if you experience any worsening of your condition.  We encourage you to follow up with a primary care provider.  Thank you for allowing [place] to be a part of your care.

## 2019-07-05 NOTE — ED Provider Notes (Signed)
[facility] Emergency Department Provider Note MRN:  [id number]  Arrival date & time: 07/05/19     Chief Complaint   Shortness of Breath   History of Present Illness   [name] is a 64 y.o. year-old male with a history of CHF, CKD, hypertension, MI presenting to the ED with chief complaint of shortness of breath.  Increase shortness of breath for the past 2 days, gradual onset.  Has been taking Lasix as needed for leg swelling but was told to increase it to 3 times a week last week.  Denies chest pain, no abdominal pain, no fever, no cough.  Sent here by his cardiologist for evaluation.  Has been living in cardiac rehab.  Review of Systems  A complete 10 system review of systems was obtained and all systems are negative except as noted in the HPI and PMH.   Patient's Health History    Past Medical History:  Diagnosis Date  . CHF (congestive heart failure) ([place])   . CKD (chronic kidney disease) stage 4, GFR 15-29 ml/min (HCC)   . Heart attack ([place]) 03/15/2019  . Hypertension 2002  . Kidney stones   . Left knee pain     Past Surgical History:  Procedure Laterality Date  . AMPUTATION TOE Left 1991   2 digit of left foot  . CHOLECYSTECTOMY    . CORONARY/GRAFT ACUTE MI REVASCULARIZATION N/A 03/12/2019   Procedure: Coronary/Graft Acute MI Revascularization;  Surgeon: Belva Crome, MD;  Location: [facility];  Service: Cardiovascular;  Laterality: N/A;  . LEFT HEART CATH AND CORONARY ANGIOGRAPHY N/A 03/12/2019   Procedure: LEFT HEART CATH AND CORONARY ANGIOGRAPHY;  Surgeon: Belva Crome, MD;  Location: [facility];  Service: Cardiovascular;  Laterality: N/A;  . LITHOTRIPSY Right     Family History  Problem Relation Age of Onset  . Heart failure Mother   . Heart disease Mother   . Cancer Mother        breast  . Heart attack Father   . Hypertension Father   . Heart disease Father     Social History   Socioeconomic History  .  Marital status: Single    Spouse name: Not on file  . Number of children: Not on file  . Years of education: Not on file  . Highest education level: Not on file  Occupational History  . Occupation: retired  Tobacco Use  . Smoking status: Former Smoker    Packs/day: 0.50    Years: 40.00    Pack years: 20.00    Types: Cigarettes    Quit date: 03/12/2019    Years since quitting: 0.3  . Smokeless tobacco: Never Used  Substance and Sexual Activity  . Alcohol use: No  . Drug use: No  . Sexual activity: Not on file  Other Topics Concern  . Not on file  Social History Narrative  . Not on file   Social Determinants of Health   Financial Resource Strain:   . Difficulty of Paying Living Expenses: Not on file  Food Insecurity:   . Worried About Charity fundraiser in the Last Year: Not on file  . Ran Out of Food in the Last Year: Not on file  Transportation Needs:   . Lack of Transportation (Medical): Not on file  . Lack of Transportation (Non-Medical): Not on file  Physical Activity:   . Days of Exercise per Week: Not on file  . Minutes of Exercise  per Session: Not on file  Stress:   . Feeling of Stress : Not on file  Social Connections: Unknown  . Frequency of Communication with Friends and Family: Patient refused  . Frequency of Social Gatherings with Friends and Family: Patient refused  . Attends Religious Services: Patient refused  . Active Member of Clubs or Organizations: Patient refused  . Attends Archivist Meetings: Patient refused  . Marital Status: Patient refused  Intimate Partner Violence: Unknown  . Fear of Current or Ex-Partner: Patient refused  . Emotionally Abused: Patient refused  . Physically Abused: Patient refused  . Sexually Abused: Patient refused     Physical Exam   Vitals:   07/05/19 1200 07/05/19 1230  BP: (!) 129/92 (!) 144/103  Pulse: 86   Resp: 20 (!) 21  Temp:    SpO2: 98%     CONSTITUTIONAL: Well-appearing, NAD NEURO:   Alert and oriented x 3, no focal deficits EYES:  eyes equal and reactive ENT/NECK:  no LAD, no JVD CARDIO: Regular rate, well-perfused, normal S1 and S2 PULM:  CTAB no wheezing or rhonchi GI/GU:  normal bowel sounds, non-distended, non-tender MSK/SPINE:  No gross deformities, mild lower extremity edema SKIN:  no rash, atraumatic PSYCH:  Appropriate speech and behavior  *Additional and/or pertinent findings included in MDM below  Diagnostic and Interventional Summary    EKG Interpretation  Date/Time:  Wednesday July 05 2019 11:11:06 EST Ventricular Rate:  87 PR Interval:    QRS Duration: 93 QT Interval:  366 QTC Calculation: 441 R Axis:   -92 Text Interpretation: Sinus rhythm Inferior infarct, old Extensive anterior infarct, old Baseline wander in lead(s) V5 Confirmed by Gerlene Fee [phone]) on 07/05/2019 11:15:34 AM      Cardiac Monitoring Interpretation:  Labs Reviewed  BASIC METABOLIC PANEL - Abnormal; Notable for the following components:      Result Value   Glucose, Bld 107 (*)    BUN 25 (*)    Creatinine, Ser 2.73 (*)    GFR calc non Af Amer 24 (*)    GFR calc Af Amer 28 (*)    All other components within normal limits  CBC - Abnormal; Notable for the following components:   RBC 3.70 (*)    Hemoglobin 11.4 (*)    HCT 35.5 (*)    RDW 15.9 (*)    All other components within normal limits  BRAIN NATRIURETIC PEPTIDE - Abnormal; Notable for the following components:   B Natriuretic Peptide 792.0 (*)    All other components within normal limits  TROPONIN I (HIGH SENSITIVITY) - Abnormal; Notable for the following components:   Troponin I (High Sensitivity) 20 (*)    All other components within normal limits  TROPONIN I (HIGH SENSITIVITY) - Abnormal; Notable for the following components:   Troponin I (High Sensitivity) 20 (*)    All other components within normal limits    DG Chest [facility] 1 View  Final Result      Medications - No data to display   Procedures  /   Critical Care Procedures  ED Course and Medical Decision Making  I have reviewed the triage vital signs, the nursing notes, and pertinent available records from the EMR.  Pertinent labs & imaging results that were available during my care of the patient were reviewed by me and considered in my medical decision making (see below for details).     Gradual mild shortness of breath for the past 2 days, coming from cardiac  rehab, large LAD STEMI back in November with stent in place.  Mild edema to the legs on exam.  In no acute distress, no increased work of breathing here, lungs clear, normal vital signs, no chest pain, EKG showing evolving old infarct with no new concerning changes.  Awaiting laboratory evaluation.  3 PM update: BNP is mildly elevated, vascular congestion on chest x-ray, with this in the lower extremity edema, patient symptoms can be explained by mild CHF exacerbation.  Patient took his dose of Lasix today and is urinating well here in the emergency department.  Troponin is minimally elevated and flat upon second troponin.  Patient is continuing to look well with no respiratory compromise, normal vital signs.  No indication for admission or further testing, appropriate for return to rehab facility and follow-up with cardiologist to discuss Lasix dosing.  Barth Kirks. Sedonia Small, MD [place] [email or web address]  Final Clinical Impressions(s) / ED Diagnoses     ICD-10-CM   1. Acute on chronic congestive heart failure, unspecified heart failure type ([place])  I50.9   2. SOB (shortness of breath)  R06.02 DG Chest [facility] 1 View    DG Chest [place] 1 View    ED Discharge Orders    None       Discharge Instructions Discussed with and Provided to Patient:     Discharge Instructions     You were evaluated in the Emergency Department and after careful evaluation, we did not find any emergent condition requiring admission or further testing in  the hospital.  Your exam/testing today is overall reassuring.  Your symptoms seem to be due to a mild flare of your heart failure.  Please talk to your cardiologist regarding your Lasix dosing.  Please return to the Emergency Department if you experience any worsening of your condition.  We encourage you to follow up with a primary care provider.  Thank you for allowing [place] to be a part of your care.       Maudie Flakes, MD 07/05/19 [phone]

## 2019-07-05 NOTE — Progress Notes (Signed)
*  PRELIMINARY RESULTS* Echocardiogram 2D Echocardiogram has been performed.  Leavy Cella 07/05/2019, 9:18 AM

## 2019-07-05 NOTE — ED Triage Notes (Signed)
Pt states that he was sent to the er this am after cardiac rehab due to sob, bilateral lower extremity swelling and ? ekg changes during rehab this am, pt alert, denies any pain,

## 2019-07-05 NOTE — Telephone Encounter (Addendum)
Received call from [facility]iac [facility] stating that pt reports today with extreme shortness of breath, leg swelling and ekg changes. Reports bp 122/102. Pt report taking furosemide mwf per Dr. Clayborne Dana last visit. Echocardiogram being done today. Faxed over EKG from today and from 06/30/19.

## 2019-07-05 NOTE — Progress Notes (Signed)
Daily Session Note  Patient Details  Name: [name] MRN: [id number] Date of Birth: [birth year] Referring Provider:     CARDIAC REHAB PHASE II ORIENTATION from 06/19/2019 in [place]  Referring Provider  [name]      Encounter Date: 07/05/2019  Check In: Session Check In - 07/05/19 0815      Check-In   Supervising physician immediately available to respond to emergencies  See telemetry face sheet for immediately available MD    Location  [facility]    Staff Present  Aundra Dubin, RN, BSN;Diane Coad, MS, EP, [facility], Exercise Physiologist    Virtual Visit  No    Medication changes reported      No    Fall or balance concerns reported     No    Tobacco Cessation  No Change    Warm-up and Cool-down  Performed as group-led instruction    Resistance Training Performed  No    VAD Patient?  No    PAD/SET Patient?  No      Pain Assessment   Currently in Pain?  No/denies    Pain Score  0-No pain    Multiple Pain Sites  No       Capillary Blood Glucose: No results found for this or any previous visit (from the past 24 hour(s)).    Social History   Tobacco Use  Smoking Status Former Smoker  . Packs/day: 0.50  . Years: 40.00  . Pack years: 20.00  . Types: Cigarettes  . Quit date: 03/12/2019  . Years since quitting: 0.3  Smokeless Tobacco Never Used    Goals Met:   Goals Unmet:  Not Applicable  Comments:  Patient started complaining of SOB during the weight exercise today. We instructed him to sit down and his SOB improved with rest but did not subside. His O2 saturation was 93%. His b/p was rechecked at 122/102. He was 118/100 initially. He said he felt SOB this morining when he got up. He had an echocardiogram scheduled today so we observed and monitored him until his echo was started. We also called Dr. Letha Cape office and spoke with his nurse [place] and faxed his tracings due to changes noted. She said she would speak  with the MD and call us back. Patient left our department at 0900.   Dr. [name] is Medical Director for [facility] Cardiac and Pulmonary Rehab.

## 2019-07-06 ENCOUNTER — Telehealth (HOSPITAL_COMMUNITY): Payer: Self-pay | Admitting: Cardiology

## 2019-07-06 NOTE — Telephone Encounter (Signed)
Patient called to request an er visit follow up appt. Patient was told by ER to follow up with chf clinic for continued SOB swelling    Add on appt 3/8 @ 9 NP/APP clinic Patient will arrange transportation

## 2019-07-07 ENCOUNTER — Encounter (HOSPITAL_COMMUNITY): Payer: Medicare Other

## 2019-07-10 ENCOUNTER — Encounter (HOSPITAL_COMMUNITY): Payer: Medicare Other

## 2019-07-11 ENCOUNTER — Ambulatory Visit (HOSPITAL_COMMUNITY)
Admission: RE | Admit: 2019-07-11 | Discharge: 2019-07-11 | Disposition: A | Discharge status: Home / Self Care | Payer: Medicare Other | Source: Ambulatory Visit | Attending: Urology | Admitting: Urology

## 2019-07-11 ENCOUNTER — Other Ambulatory Visit: Payer: Self-pay

## 2019-07-11 DIAGNOSIS — N200 Calculus of kidney: Secondary | ICD-10-CM | POA: Diagnosis not present

## 2019-07-11 NOTE — Progress Notes (Signed)
The CT shows a significant stone burden with an obstructing upper pole stone on the left that appears chronic with some atrophy of the overlying kidney.   He needs a left PCNL but since he is on Brillinta we probably can't do that anytime soon and may have to consider stent placement.  I need to get him back in to discuss.  He was supposed to have f/u last month but doesn't appear to have.

## 2019-07-12 ENCOUNTER — Ambulatory Visit (HOSPITAL_COMMUNITY)
Admission: RE | Admit: 2019-07-12 | Discharge: 2019-07-12 | Disposition: A | Discharge status: Home / Self Care | Payer: Medicare Other | Source: Ambulatory Visit | Attending: Adult Health | Admitting: Adult Health

## 2019-07-12 ENCOUNTER — Encounter (HOSPITAL_COMMUNITY): Payer: Medicare Other

## 2019-07-12 ENCOUNTER — Other Ambulatory Visit: Payer: Self-pay

## 2019-07-12 ENCOUNTER — Encounter (HOSPITAL_COMMUNITY): Payer: Self-pay

## 2019-07-12 VITALS — BP 130/85 | HR 85 | Wt 193.0 lb

## 2019-07-12 DIAGNOSIS — N184 Chronic kidney disease, stage 4 (severe): Secondary | ICD-10-CM | POA: Diagnosis not present

## 2019-07-12 DIAGNOSIS — Z7982 Long term (current) use of aspirin: Secondary | ICD-10-CM | POA: Diagnosis not present

## 2019-07-12 DIAGNOSIS — I5023 Acute on chronic systolic (congestive) heart failure: Secondary | ICD-10-CM | POA: Insufficient documentation

## 2019-07-12 DIAGNOSIS — I252 Old myocardial infarction: Secondary | ICD-10-CM | POA: Diagnosis not present

## 2019-07-12 DIAGNOSIS — Z955 Presence of coronary angioplasty implant and graft: Secondary | ICD-10-CM | POA: Insufficient documentation

## 2019-07-12 DIAGNOSIS — Z803 Family history of malignant neoplasm of breast: Secondary | ICD-10-CM | POA: Diagnosis not present

## 2019-07-12 DIAGNOSIS — Z79899 Other long term (current) drug therapy: Secondary | ICD-10-CM | POA: Diagnosis not present

## 2019-07-12 DIAGNOSIS — Z7902 Long term (current) use of antithrombotics/antiplatelets: Secondary | ICD-10-CM | POA: Diagnosis not present

## 2019-07-12 DIAGNOSIS — I130 Hypertensive heart and chronic kidney disease with heart failure and stage 1 through stage 4 chronic kidney disease, or unspecified chronic kidney disease: Secondary | ICD-10-CM | POA: Diagnosis not present

## 2019-07-12 DIAGNOSIS — Z8249 Family history of ischemic heart disease and other diseases of the circulatory system: Secondary | ICD-10-CM | POA: Insufficient documentation

## 2019-07-12 DIAGNOSIS — Z87891 Personal history of nicotine dependence: Secondary | ICD-10-CM | POA: Diagnosis not present

## 2019-07-12 DIAGNOSIS — Z87442 Personal history of urinary calculi: Secondary | ICD-10-CM | POA: Diagnosis not present

## 2019-07-12 DIAGNOSIS — I2510 Atherosclerotic heart disease of native coronary artery without angina pectoris: Secondary | ICD-10-CM | POA: Diagnosis not present

## 2019-07-12 LAB — BASIC METABOLIC PANEL
Anion gap: 8 (ref 5–15)
BUN: 28 mg/dL — ABNORMAL HIGH (ref 8–23)
CO2: 23 mmol/L (ref 22–32)
Calcium: 10.5 mg/dL — ABNORMAL HIGH (ref 8.9–10.3)
Chloride: 111 mmol/L (ref 98–111)
Creatinine, Ser: 2.84 mg/dL — ABNORMAL HIGH (ref 0.61–1.24)
GFR calc Af Amer: 26 mL/min — ABNORMAL LOW (ref >60)
GFR calc non Af Amer: 23 mL/min — ABNORMAL LOW (ref >60)
Glucose, Bld: 108 mg/dL — ABNORMAL HIGH (ref 70–99)
Potassium: 4.5 mmol/L (ref 3.5–5.1)
Sodium: 142 mmol/L (ref 135–145)

## 2019-07-12 MED ORDER — FUROSEMIDE 40 MG PO TABS: 40.0000 mg | ORAL_TABLET | Freq: Every day | ORAL | 5 refills | Status: DC

## 2019-07-12 NOTE — Patient Instructions (Signed)
INCREASE Lasix to 40mg  (1 tab) twice a day for  2 days THEN start taking Lasix 40mg  (1 tab) daily after that.   Labs today We will only contact you if something comes back abnormal or we need to make some changes. Otherwise no news is good news!   Your physician recommends that you schedule a follow-up appointment in: 10 days with NP/PA.   Please call office at [phone] option 2 if you have any questions or concerns.   At the [facility], you and your health needs are our priority. As part of our continuing mission to provide you with exceptional heart care, we have created designated Provider Care Teams. These Care Teams include your primary Cardiologist (physician) and Advanced Practice Providers (APPs- Physician Assistants and Nurse Practitioners) who all work together to provide you with the care you need, when you need it.   You may see any of the following providers on your designated Care Team at your next follow up: Marland Kitchen Dr Glori Bickers . Dr Loralie Champagne . Darrick Grinder, NP . Lyda Jester, PA . Audry Riles, PharmD   Please be sure to bring in all your medications bottles to every appointment.

## 2019-07-12 NOTE — Progress Notes (Signed)
ADVANCED HF CLINIC PROGRESS NOTE  Primary Cardiologist: DR. Burt Knack Primary Care: Dr. Orson Ape AHF: Dr. Haroldine Laws   Reason for Visit: Post ED F/u for Exertional Dyspnea / Chronic Systolic Heart Failure   HPI:  64 y/o male, former smoker with HTN, CKD IV, CAD and systolic HF. Experienced a large anterolateral STEMI on 03/12/19. Taken to cath lab.   Cath 03/12/2019  Total occlusion of the mid LAD treated with angioplasty and stenting using a 4.0 x 22 Onyx deployed at 14 atm. 0% stenosis was noted post procedure and TIMI grade III flow increased from 0 to 3. The final angiographic images of the LAD demonstrates apical occlusion due to embolus.  Luminal irregularities in the left main but no agreeable stenosis.  Large circumflex with 3 obtuse marginals. The first marginal is large and has a very distal 90% stenosis in 2 branches. There is a trifurcation distally in the first marginal.  RCA Moderate diffuse luminal irregularities but widely patent.  Mid anterior wall to apical akinesis. EF 30 to 35% acutely. LVEDP 31 mmHg.   Post cath had low BP and shock but recovered. EF 20% by echo. No significant MR. Creatinine post cath 3.1-3.4. Now followed by nephrology. Has been taking Lasix 40 mg MWF.    Had recent repeat echo 3/21. EF 20-25%. RV normal.  He presents to clinic today for post ED f/u. [facility] went to the [facility] ED w/ CC of exertional dyspnea. No resting symptoms. No CP. ED provider note reviewed. Per their assessment, BNP was mildly elevated, vascular congestion on chest x-ray. Troponin was minimally elevated and flat upon second troponin.  It was noted that pt had taken a dose of PO Lasix prior to coming in to the ED and noted some improvement and was urinating well. No felt to require admission. Was not given IV Lasix. Was released and advised to f/u in [facility].   Presents today for f/u. Still SOB w/ physical activity. No resting symptoms. Denies orthopnea/PND. Has mild  bilateral LEE. Denies CP. Has been taking lasix 3 days a week. Reports normal UOP. Admits to dietary indiscretion w/ sodium recently.   ReDs Clip measurement elevated today at 50%     Past Medical History:  Diagnosis Date  . CHF (congestive heart failure) ([place])   . CKD (chronic kidney disease) stage 4, GFR 15-29 ml/min (HCC)   . Heart attack ([place]) 03/15/2019  . Hypertension 2002  . Kidney stones   . Left knee pain     Current Outpatient Medications  Medication Sig Dispense Refill  . allopurinol (ZYLOPRIM) 100 MG tablet Take 100 mg by mouth daily.     Marland Kitchen aspirin 81 MG chewable tablet Chew 1 tablet (81 mg total) by mouth daily. 90 tablet 1  . atorvastatin (LIPITOR) 80 MG tablet Take 1 tablet (80 mg total) by mouth daily at 6 PM. 90 tablet 1  . BIDIL 20-37.5 MG tablet Take 1 tablet by mouth twice daily 60 tablet 0  . carvedilol (COREG) 25 MG tablet Take 12.5 mg by mouth 2 (two) times daily with a meal.     . furosemide (LASIX) 40 MG tablet Take 40 mg by mouth daily as needed for fluid or edema.     . nitroGLYCERIN (NITROSTAT) 0.4 MG SL tablet Place 1 tablet (0.4 mg total) under the tongue every 5 (five) minutes as needed. 25 tablet 2  . sodium bicarbonate 650 MG tablet Take 650 mg by mouth 3 (three) times daily.    Marland Kitchen  ticagrelor (BRILINTA) 90 MG TABS tablet Take 1 tablet (90 mg total) by mouth 2 (two) times daily. 180 tablet 1   No current facility-administered medications for this encounter.    No Known Allergies    Social History   Socioeconomic History  . Marital status: Single    Spouse name: Not on file  . Number of children: Not on file  . Years of education: Not on file  . Highest education level: Not on file  Occupational History  . Occupation: retired  Tobacco Use  . Smoking status: Former Smoker    Packs/day: 0.50    Years: 40.00    Pack years: 20.00    Types: Cigarettes    Quit date: 03/12/2019    Years since quitting: 0.3  . Smokeless tobacco: Never Used    Substance and Sexual Activity  . Alcohol use: No  . Drug use: No  . Sexual activity: Not on file  Other Topics Concern  . Not on file  Social History Narrative  . Not on file   Social Determinants of Health   Financial Resource Strain:   . Difficulty of Paying Living Expenses: Not on file  Food Insecurity:   . Worried About Charity fundraiser in the Last Year: Not on file  . Ran Out of Food in the Last Year: Not on file  Transportation Needs:   . Lack of Transportation (Medical): Not on file  . Lack of Transportation (Non-Medical): Not on file  Physical Activity:   . Days of Exercise per Week: Not on file  . Minutes of Exercise per Session: Not on file  Stress:   . Feeling of Stress : Not on file  Social Connections: Unknown  . Frequency of Communication with Friends and Family: Patient refused  . Frequency of Social Gatherings with Friends and Family: Patient refused  . Attends Religious Services: Patient refused  . Active Member of Clubs or Organizations: Patient refused  . Attends Archivist Meetings: Patient refused  . Marital Status: Patient refused  Intimate Partner Violence: Unknown  . Fear of Current or Ex-Partner: Patient refused  . Emotionally Abused: Patient refused  . Physically Abused: Patient refused  . Sexually Abused: Patient refused      Family History  Problem Relation Age of Onset  . Heart failure Mother   . Heart disease Mother   . Cancer Mother        breast  . Heart attack Father   . Hypertension Father   . Heart disease Father     Vitals:   07/12/19 1031  BP: 130/85  Pulse: 85  SpO2: 99%  Weight: 87.5 kg (193 lb)    PHYSICAL EXAM: ReDs clip 50% General:  Well appearing AAM. No respiratory difficulty HEENT: normal Neck: supple. elevated JVD to angle of jaw. Carotids 2+ bilat; no bruits. No lymphadenopathy or thyromegaly appreciated. Cor: PMI nondisplaced. Regular rate & rhythm. No rubs, gallops or murmurs. Lungs:  clear Abdomen: soft, nontender, nondistended. No hepatosplenomegaly. No bruits or masses. Good bowel sounds. Extremities: no cyanosis, clubbing, rash, trace bilateral LEE  Neuro: alert & oriented x 3, cranial nerves grossly intact. moves all 4 extremities w/o difficulty. Affect pleasant.   ECG: not performed   ASSESSMENT & PLAN:  1. CAD s/p acute anterolateral STEMI 03/12/19 - s/p DES LAD - EF 20-25% - denies CP which was primary symptom w/ previous stemi. suspect exertional dyspnea 2/2 volume overload  - Continue DAPT + statin  2. Acute on Chronic Sstolic HF - EF 81%. Following anteriorlateral stemi - Repeat Echo 3/21: EF 20-25%. RV ok. Will refer to EP for ICD - Currently NYHA III w/ Volume overload, ReDs clip measurement markedly elevated at 50% today. Extremities warm. BP normotensive. No signs of low output. Will attempt to increase PO diuretics.  -Will increase Lasix to 40 mg bid x 2 days and change to 40 mg daily thereafter.  - Check BMP today and again in 7 days  - Continue Bidil 1 tab TID and carvedilol 12.5 bid.  - Not candidate for Entresto, SGLT2i or spiro with CKD IV. - Consider ivabradine as needed   3. 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 Hinda Lenis - Increasing diuretics per above given a/c CHF - Check BMP today and again in 7 days - He has f/u with nephrology next month, 4/17  4. HTN - controlled on current regimen   5. Tobacco use - congratulated on cessation  F/u in 7-10 days   Lyda Jester, PA-C  10:47 AM

## 2019-07-14 ENCOUNTER — Telehealth (HOSPITAL_COMMUNITY): Payer: Self-pay | Admitting: Cardiology

## 2019-07-14 ENCOUNTER — Other Ambulatory Visit: Payer: Self-pay

## 2019-07-14 ENCOUNTER — Encounter (HOSPITAL_COMMUNITY)
Admission: RE | Admit: 2019-07-14 | Discharge: 2019-07-14 | Disposition: A | Discharge status: Home / Self Care | Payer: Medicare Other | Source: Ambulatory Visit | Attending: Interventional Cardiology | Admitting: Interventional Cardiology

## 2019-07-14 DIAGNOSIS — Z955 Presence of coronary angioplasty implant and graft: Secondary | ICD-10-CM

## 2019-07-14 DIAGNOSIS — I213 ST elevation (STEMI) myocardial infarction of unspecified site: Secondary | ICD-10-CM | POA: Diagnosis not present

## 2019-07-14 DIAGNOSIS — I5022 Chronic systolic (congestive) heart failure: Secondary | ICD-10-CM

## 2019-07-14 NOTE — Progress Notes (Signed)
Daily Session Note  Patient Details  Name: [name] MRN: [id number] Date of Birth: [birth year] Referring Provider:     CARDIAC REHAB PHASE II ORIENTATION from 06/19/2019 in [place]  Referring Provider  [name]      Encounter Date: 07/14/2019  Check In: Session Check In - 07/14/19 0815      Check-In   Supervising physician immediately available to respond to emergencies  See telemetry face sheet for immediately available MD    Location  [facility]    Staff Present  Aundra Dubin, RN, BSN;Diane Coad, MS, EP, [facility], Exercise Physiologist    Virtual Visit  No    Medication changes reported      Yes    Comments  Patient went to the HF clinic for ED follow. PA-C increased his lasix to 40 mg daily.    Fall or balance concerns reported     No    Tobacco Cessation  No Change    Warm-up and Cool-down  Performed as group-led instruction    Resistance Training Performed  Yes    VAD Patient?  No    PAD/SET Patient?  No      Pain Assessment   Currently in Pain?  No/denies    Pain Score  0-No pain    Multiple Pain Sites  No       Capillary Blood Glucose: No results found for this or any previous visit (from the past 24 hour(s)).    Social History   Tobacco Use  Smoking Status Former Smoker  . Packs/day: 0.50  . Years: 40.00  . Pack years: 20.00  . Types: Cigarettes  . Quit date: 03/12/2019  . Years since quitting: 0.3  Smokeless Tobacco Never Used    Goals Met:  Independence with exercise equipment Exercise tolerated well No report of cardiac concerns or symptoms Strength training completed today  Goals Unmet:  Not Applicable  Comments: Check out 915.   Dr. [name] is Medical Director for [facility] Cardiac and Pulmonary Rehab.

## 2019-07-14 NOTE — Telephone Encounter (Signed)
-----   Message from Consuelo Pandy, [place] sent at 07/12/2019  8:43 PM EST ----- Regarding: referr to EP Please place referral to EP for ICD. Echo 3/21 showed EF 20-25%

## 2019-07-17 ENCOUNTER — Encounter (HOSPITAL_COMMUNITY)
Admission: RE | Admit: 2019-07-17 | Discharge: 2019-07-17 | Disposition: A | Discharge status: Home / Self Care | Payer: Medicare Other | Source: Ambulatory Visit | Attending: Interventional Cardiology | Admitting: Interventional Cardiology

## 2019-07-17 ENCOUNTER — Other Ambulatory Visit: Payer: Self-pay

## 2019-07-17 DIAGNOSIS — I213 ST elevation (STEMI) myocardial infarction of unspecified site: Secondary | ICD-10-CM | POA: Diagnosis not present

## 2019-07-17 DIAGNOSIS — Z955 Presence of coronary angioplasty implant and graft: Secondary | ICD-10-CM

## 2019-07-17 NOTE — Progress Notes (Signed)
Daily Session Note  Patient Details  Name: [name] MRN: [id number] Date of Birth: 09/19/1955 Referring Provider:     CARDIAC REHAB PHASE II ORIENTATION from 06/19/2019 in [place]  Referring Provider  [name]      Encounter Date: 07/17/2019  Check In: Session Check In - 07/17/19 0801      Check-In   Supervising physician immediately available to respond to emergencies  See telemetry face sheet for immediately available MD    Location  [facility]    Staff Present  Aundra Dubin, RN, BSN;Diane Coad, MS, EP, [facility], Exercise Physiologist    Virtual Visit  No    Medication changes reported      No    Fall or balance concerns reported     No    Tobacco Cessation  No Change    Warm-up and Cool-down  Performed as group-led instruction    Resistance Training Performed  Yes    VAD Patient?  No    PAD/SET Patient?  No      Pain Assessment   Currently in Pain?  No/denies    Pain Score  0-No pain    Multiple Pain Sites  No       Capillary Blood Glucose: No results found for this or any previous visit (from the past 24 hour(s)).    Social History   Tobacco Use  Smoking Status Former Smoker  . Packs/day: 0.50  . Years: 40.00  . Pack years: 20.00  . Types: Cigarettes  . Quit date: 03/12/2019  . Years since quitting: 0.3  Smokeless Tobacco Never Used    Goals Met:  Independence with exercise equipment Exercise tolerated well No report of cardiac concerns or symptoms Strength training completed today  Goals Unmet:  Not Applicable  Comments: Check out 915.   Dr. [name] is Medical Director for [facility] Cardiac and Pulmonary Rehab.

## 2019-07-18 ENCOUNTER — Other Ambulatory Visit: Payer: Self-pay | Admitting: Family Medicine

## 2019-07-19 ENCOUNTER — Encounter (HOSPITAL_COMMUNITY)
Admission: RE | Admit: 2019-07-19 | Discharge: 2019-07-19 | Disposition: A | Discharge status: Home / Self Care | Payer: Medicare Other | Source: Ambulatory Visit | Attending: Interventional Cardiology | Admitting: Interventional Cardiology

## 2019-07-19 ENCOUNTER — Other Ambulatory Visit: Payer: Self-pay

## 2019-07-19 ENCOUNTER — Telehealth (HOSPITAL_COMMUNITY): Payer: Self-pay | Admitting: Cardiology

## 2019-07-19 DIAGNOSIS — I213 ST elevation (STEMI) myocardial infarction of unspecified site: Secondary | ICD-10-CM | POA: Diagnosis not present

## 2019-07-19 DIAGNOSIS — Z955 Presence of coronary angioplasty implant and graft: Secondary | ICD-10-CM

## 2019-07-19 NOTE — Progress Notes (Signed)
Daily Session Note  Patient Details  Name: Gaberial Cada MRN: [id number] Date of Birth: 10/12/1955 Referring Provider:     CARDIAC REHAB PHASE II ORIENTATION from 06/19/2019 in [place]  Referring Provider  [name]      Encounter Date: 07/19/2019  Check In: Session Check In - 07/19/19 0815      Check-In   Supervising physician immediately available to respond to emergencies  See telemetry face sheet for immediately available MD    Location  [facility]    Staff Present  Aundra Dubin, RN, BSN;Diane Coad, MS, EP, [facility], Exercise Physiologist    Virtual Visit  No    Medication changes reported      No    Fall or balance concerns reported     No    Tobacco Cessation  No Change    Warm-up and Cool-down  Performed as group-led instruction    Resistance Training Performed  Yes    VAD Patient?  No    PAD/SET Patient?  No      Pain Assessment   Currently in Pain?  No/denies    Pain Score  0-No pain       Capillary Blood Glucose: No results found for this or any previous visit (from the past 24 hour(s)).    Social History   Tobacco Use  Smoking Status Former Smoker  . Packs/day: 0.50  . Years: 40.00  . Pack years: 20.00  . Types: Cigarettes  . Quit date: 03/12/2019  . Years since quitting: 0.3  Smokeless Tobacco Never Used    Goals Met:  Independence with exercise equipment Exercise tolerated well No report of cardiac concerns or symptoms Strength training completed today  Goals Unmet:  Not Applicable  Comments: Check out 915.   Dr. [name] is Medical Director for [facility] Cardiac and Pulmonary Rehab.

## 2019-07-19 NOTE — Telephone Encounter (Signed)
-----   Message from Consuelo Pandy, [place] sent at 07/12/2019  8:43 PM EST ----- Regarding: referr to EP Please place referral to EP for ICD. Echo 3/21 showed EF 20-25%

## 2019-07-19 NOTE — Telephone Encounter (Signed)
Opened in error Ep referral already placed

## 2019-07-21 ENCOUNTER — Other Ambulatory Visit: Payer: Self-pay

## 2019-07-21 ENCOUNTER — Encounter (HOSPITAL_COMMUNITY)
Admission: RE | Admit: 2019-07-21 | Discharge: 2019-07-21 | Disposition: A | Discharge status: Home / Self Care | Payer: Medicare Other | Source: Ambulatory Visit | Attending: Interventional Cardiology | Admitting: Interventional Cardiology

## 2019-07-21 DIAGNOSIS — Z955 Presence of coronary angioplasty implant and graft: Secondary | ICD-10-CM

## 2019-07-21 DIAGNOSIS — I213 ST elevation (STEMI) myocardial infarction of unspecified site: Secondary | ICD-10-CM | POA: Diagnosis not present

## 2019-07-21 NOTE — Progress Notes (Signed)
Daily Session Note  Patient Details  Name: [name] MRN: [id number] Date of Birth: 1956-01-11 Referring Provider:     CARDIAC REHAB PHASE II ORIENTATION from 06/19/2019 in [place]  Referring Provider  [name]      Encounter Date: 07/21/2019  Check In: Session Check In - 07/21/19 0814      Check-In   Supervising physician immediately available to respond to emergencies  See telemetry face sheet for immediately available MD    Location  [facility]    Staff Present  Aundra Dubin, RN, BSN;Diane Coad, MS, EP, [facility], Exercise Physiologist    Virtual Visit  No    Medication changes reported      No    Fall or balance concerns reported     No    Tobacco Cessation  No Change    Warm-up and Cool-down  Performed as group-led instruction    Resistance Training Performed  Yes    VAD Patient?  No    PAD/SET Patient?  No      Pain Assessment   Currently in Pain?  No/denies    Pain Score  0-No pain    Multiple Pain Sites  No       Capillary Blood Glucose: No results found for this or any previous visit (from the past 24 hour(s)).    Social History   Tobacco Use  Smoking Status Former Smoker  . Packs/day: 0.50  . Years: 40.00  . Pack years: 20.00  . Types: Cigarettes  . Quit date: 03/12/2019  . Years since quitting: 0.3  Smokeless Tobacco Never Used    Goals Met:  Independence with exercise equipment Exercise tolerated well No report of cardiac concerns or symptoms Strength training completed today  Goals Unmet:  Not Applicable  Comments: Check out 915.   Dr. [name] is Medical Director for [facility] Cardiac and Pulmonary Rehab.

## 2019-07-24 ENCOUNTER — Encounter (HOSPITAL_COMMUNITY): Payer: Self-pay | Admitting: Emergency Medicine

## 2019-07-24 ENCOUNTER — Emergency Department (HOSPITAL_COMMUNITY)
Admission: EM | Admit: 2019-07-24 | Discharge: 2019-07-24 | Disposition: A | Discharge status: Home / Self Care | Payer: Medicare Other | Attending: Emergency Medicine | Admitting: Emergency Medicine

## 2019-07-24 ENCOUNTER — Other Ambulatory Visit: Payer: Self-pay

## 2019-07-24 ENCOUNTER — Emergency Department (HOSPITAL_COMMUNITY): Payer: Medicare Other

## 2019-07-24 ENCOUNTER — Encounter (HOSPITAL_COMMUNITY)
Admission: RE | Admit: 2019-07-24 | Discharge: 2019-07-24 | Disposition: A | Discharge status: Home / Self Care | Payer: Medicare Other | Source: Ambulatory Visit | Attending: Interventional Cardiology | Admitting: Interventional Cardiology

## 2019-07-24 DIAGNOSIS — Z87891 Personal history of nicotine dependence: Secondary | ICD-10-CM | POA: Insufficient documentation

## 2019-07-24 DIAGNOSIS — I130 Hypertensive heart and chronic kidney disease with heart failure and stage 1 through stage 4 chronic kidney disease, or unspecified chronic kidney disease: Secondary | ICD-10-CM | POA: Insufficient documentation

## 2019-07-24 DIAGNOSIS — Z7982 Long term (current) use of aspirin: Secondary | ICD-10-CM | POA: Insufficient documentation

## 2019-07-24 DIAGNOSIS — Z955 Presence of coronary angioplasty implant and graft: Secondary | ICD-10-CM

## 2019-07-24 DIAGNOSIS — R0602 Shortness of breath: Secondary | ICD-10-CM | POA: Diagnosis present

## 2019-07-24 DIAGNOSIS — I5020 Unspecified systolic (congestive) heart failure: Secondary | ICD-10-CM | POA: Diagnosis not present

## 2019-07-24 DIAGNOSIS — N184 Chronic kidney disease, stage 4 (severe): Secondary | ICD-10-CM | POA: Insufficient documentation

## 2019-07-24 DIAGNOSIS — Z79899 Other long term (current) drug therapy: Secondary | ICD-10-CM | POA: Insufficient documentation

## 2019-07-24 DIAGNOSIS — I509 Heart failure, unspecified: Secondary | ICD-10-CM

## 2019-07-24 DIAGNOSIS — Z89422 Acquired absence of other left toe(s): Secondary | ICD-10-CM | POA: Diagnosis not present

## 2019-07-24 DIAGNOSIS — I213 ST elevation (STEMI) myocardial infarction of unspecified site: Secondary | ICD-10-CM

## 2019-07-24 LAB — BRAIN NATRIURETIC PEPTIDE: B Natriuretic Peptide: 597 pg/mL — ABNORMAL HIGH (ref 0.0–100.0)

## 2019-07-24 LAB — CBC WITH DIFFERENTIAL/PLATELET
Abs Immature Granulocytes: 0.02 10*3/uL (ref 0.00–0.07)
Basophils Absolute: 0 10*3/uL (ref 0.0–0.1)
Basophils Relative: 0 %
Eosinophils Absolute: 0.2 10*3/uL (ref 0.0–0.5)
Eosinophils Relative: 4 %
HCT: 34.5 % — ABNORMAL LOW (ref 39.0–52.0)
Hemoglobin: 11.1 g/dL — ABNORMAL LOW (ref 13.0–17.0)
Immature Granulocytes: 0 %
Lymphocytes Relative: 24 %
Lymphs Abs: 1.6 10*3/uL (ref 0.7–4.0)
MCH: 31.5 pg (ref 26.0–34.0)
MCHC: 32.2 g/dL (ref 30.0–36.0)
MCV: 98 fL (ref 80.0–100.0)
Monocytes Absolute: 0.9 10*3/uL (ref 0.1–1.0)
Monocytes Relative: 13 %
Neutro Abs: 3.9 10*3/uL (ref 1.7–7.7)
Neutrophils Relative %: 59 %
Platelets: 230 10*3/uL (ref 150–400)
RBC: 3.52 MIL/uL — ABNORMAL LOW (ref 4.22–5.81)
RDW: 16.1 % — ABNORMAL HIGH (ref 11.5–15.5)
WBC: 6.6 10*3/uL (ref 4.0–10.5)
nRBC: 0 % (ref 0.0–0.2)

## 2019-07-24 LAB — BASIC METABOLIC PANEL
Anion gap: 9 (ref 5–15)
BUN: 32 mg/dL — ABNORMAL HIGH (ref 8–23)
CO2: 25 mmol/L (ref 22–32)
Calcium: 10.2 mg/dL (ref 8.9–10.3)
Chloride: 110 mmol/L (ref 98–111)
Creatinine, Ser: 2.78 mg/dL — ABNORMAL HIGH (ref 0.61–1.24)
GFR calc Af Amer: 27 mL/min — ABNORMAL LOW (ref >60)
GFR calc non Af Amer: 23 mL/min — ABNORMAL LOW (ref >60)
Glucose, Bld: 106 mg/dL — ABNORMAL HIGH (ref 70–99)
Potassium: 4 mmol/L (ref 3.5–5.1)
Sodium: 144 mmol/L (ref 135–145)

## 2019-07-24 LAB — URINALYSIS, ROUTINE W REFLEX MICROSCOPIC
Bilirubin Urine: NEGATIVE
Glucose, UA: NEGATIVE mg/dL
Hgb urine dipstick: NEGATIVE
Ketones, ur: NEGATIVE mg/dL
Leukocytes,Ua: NEGATIVE
Nitrite: NEGATIVE
Protein, ur: NEGATIVE mg/dL
Specific Gravity, Urine: 1.005 (ref 1.005–1.030)
pH: 7 (ref 5.0–8.0)

## 2019-07-24 LAB — TROPONIN I (HIGH SENSITIVITY)
Troponin I (High Sensitivity): 18 ng/L — ABNORMAL HIGH (ref <18)
Troponin I (High Sensitivity): 17 ng/L (ref <18)

## 2019-07-24 MED ORDER — FUROSEMIDE 10 MG/ML IJ SOLN
40.0000 mg | Freq: Once | INTRAMUSCULAR | Status: AC
  Administered 2019-07-24: 40 mg via INTRAVENOUS
  Filled 2019-07-24: qty 4

## 2019-07-24 NOTE — ED Triage Notes (Signed)
Pt states was at cardiac rehab this am and reports was unable to complete due to shortness of breath.pt denies pain. Pt reports history of same and reports "I think it might be fluid again."

## 2019-07-24 NOTE — ED Provider Notes (Signed)
[facility] EMERGENCY DEPARTMENT Provider Note   CSN: [id number] Arrival date & time: 07/24/19  2505     History Chief Complaint  Patient presents with  . Shortness of Breath    [name] is a 64 y.o. male with past medical history significant for chronic systolic heart failure, CKD stage IV, hypertension, MI presents to emergency room today with chief complaint of acute onset of shortness of breath.  Patient states it started last night.  He went to cardiac rehab this morning and was unable to perform the exercises because he felt short of breath. He denies any associated chest pain or diaphoresis. He opted to come to emergency department for further evaluation. He is currently taking 40 mg PO lasix x 1 week and states before that was on lasix PRN. He endorses good urine output with lasix. He also admits to 10 pound weight gain and eating more salt than he is supposed to in the last 1 week.  He denies fever, chills, cough, wheezing, abdominal pain. Denies history of PE or blood clots.   Chart review shows patient was seen in the ED on 07/05/2019 with similar complaint. His creatinine during that visit was 2.73, BNP 792, chest xray showed  vascular congestion. He followed up with cardiology who increased his laisx to daily use. Patient had echo on 07/05/2019 that showed LVEF of 20 to 25%. His cardiologist is Dr. Haroldine Laws.   Past Medical History:  Diagnosis Date  . CHF (congestive heart failure) ([place])   . CKD (chronic kidney disease) stage 4, GFR 15-29 ml/min (HCC)   . Heart attack ([place]) 03/15/2019  . Hypertension 2002  . Kidney stones   . Left knee pain     Patient Active Problem List   Diagnosis Date Noted  . Syncope 04/04/2019  . Congestive heart failure ([place]) 03/21/2019  . Acute ST elevation myocardial infarction (STEMI) due to occlusion of mid portion of left anterior descending (LAD) coronary artery ([place]) 03/12/2019  . ST elevation myocardial infarction involving left  anterior descending (LAD) coronary artery ([place])   . Acute systolic heart failure ([place])   . Chronic kidney disease 06/11/2016  . Elevated PSA 06/11/2016  . Hyperlipidemia 06/11/2016  . Essential hypertension 05/28/2016    Past Surgical History:  Procedure Laterality Date  . AMPUTATION TOE Left 1991   2 digit of left foot  . CHOLECYSTECTOMY    . CORONARY/GRAFT ACUTE MI REVASCULARIZATION N/A 03/12/2019   Procedure: Coronary/Graft Acute MI Revascularization;  Surgeon: Belva Crome, MD;  Location: [place] [facility];  Service: Cardiovascular;  Laterality: N/A;  . LEFT HEART CATH AND CORONARY ANGIOGRAPHY N/A 03/12/2019   Procedure: LEFT HEART CATH AND CORONARY ANGIOGRAPHY;  Surgeon: Belva Crome, MD;  Location: [place] [facility];  Service: Cardiovascular;  Laterality: N/A;  . LITHOTRIPSY Right        Family History  Problem Relation Age of Onset  . Heart failure Mother   . Heart disease Mother   . Cancer Mother        breast  . Heart attack Father   . Hypertension Father   . Heart disease Father     Social History   Tobacco Use  . Smoking status: Former Smoker    Packs/day: 0.50    Years: 40.00    Pack years: 20.00    Types: Cigarettes    Quit date: 03/12/2019    Years since quitting: 0.3  . Smokeless tobacco: Never Used  Substance Use Topics  .  Alcohol use: No  . Drug use: No    Home Medications Prior to Admission medications   Medication Sig Start Date End Date Taking? Authorizing Provider  allopurinol (ZYLOPRIM) 100 MG tablet Take 100 mg by mouth daily.  04/25/19 06/27/20 Yes [provider]  aspirin 81 MG chewable tablet Chew 1 tablet (81 mg total) by mouth daily. 03/16/19  Yes Cheryln Manly, NP  atorvastatin (LIPITOR) 80 MG tablet Take 1 tablet (80 mg total) by mouth daily at 6 PM. 04/11/19  Yes Corum, Rex Kras, MD  BIDIL 20-37.5 MG tablet Take 1 tablet by mouth twice daily Patient taking differently: Take 1 tablet by mouth 2 (two) times daily.   07/18/19  Yes Corum, Rex Kras, MD  carvedilol (COREG) 25 MG tablet Take 12.5 mg by mouth 2 (two) times daily with a meal.    Yes [provider]  furosemide (LASIX) 40 MG tablet Take 1 tablet (40 mg total) by mouth daily. 07/12/19  Yes Rosita Fire, Brittainy M, PA-C  sodium bicarbonate 650 MG tablet Take 650 mg by mouth 3 (three) times daily.   Yes [provider]  ticagrelor (BRILINTA) 90 MG TABS tablet Take 1 tablet (90 mg total) by mouth 2 (two) times daily. 05/18/19  Yes Bensimhon, Shaune Pascal, MD  nitroGLYCERIN (NITROSTAT) 0.4 MG SL tablet Place 1 tablet (0.4 mg total) under the tongue every 5 (five) minutes as needed. 04/11/19   Corum, Rex Kras, MD    Allergies    Patient has no known allergies.  Review of Systems   Review of Systems All other systems are reviewed and are negative for acute change except as noted in the HPI.  Physical Exam Updated Vital Signs BP (!) 126/93 (BP Location: Right Arm)   Pulse 90   Temp 97.7 F (36.5 C) (Oral)   Resp 18   Ht 6' (1.829 m)   Wt 92.1 kg   SpO2 96%   BMI 27.53 kg/m   Physical Exam Vitals and nursing note reviewed.  Constitutional:      General: He is not in acute distress.    Appearance: He is not ill-appearing.  HENT:     Head: Normocephalic and atraumatic.     Right Ear: Tympanic membrane and external ear normal.     Left Ear: Tympanic membrane and external ear normal.     Nose: Nose normal.     Mouth/Throat:     Mouth: Mucous membranes are moist.     Pharynx: Oropharynx is clear.  Eyes:     General: No scleral icterus.       Right eye: No discharge.        Left eye: No discharge.     Extraocular Movements: Extraocular movements intact.     Conjunctiva/sclera: Conjunctivae normal.     Pupils: Pupils are equal, round, and reactive to light.  Neck:     Vascular: No JVD.  Cardiovascular:     Rate and Rhythm: Normal rate and regular rhythm.     Pulses: Normal pulses.          Radial pulses are 2+ on the right side  and 2+ on the left side.     Heart sounds: Normal heart sounds.  Pulmonary:     Comments: Lungs clear to auscultation in all fields. Symmetric chest rise. No wheezing, rales, or rhonchi.  Normal work of breathing.  SPO2 96% on room air during exam. Abdominal:     Comments: Abdomen is soft, non-distended, and  non-tender in all quadrants. No rigidity, no guarding. No peritoneal signs.  Musculoskeletal:        General: Normal range of motion.     Cervical back: Normal range of motion.     Right lower leg: 1+ Pitting Edema present.     Left lower leg: 1+ Pitting Edema present.  Skin:    General: Skin is warm and dry.     Capillary Refill: Capillary refill takes less than 2 seconds.  Neurological:     Mental Status: He is oriented to person, place, and time.     GCS: GCS eye subscore is 4. GCS verbal subscore is 5. GCS motor subscore is 6.     Comments: Fluent speech, no facial droop.  Psychiatric:        Behavior: Behavior normal.     ED Results / Procedures / Treatments   Labs (all labs ordered are listed, but only abnormal results are displayed) Labs Reviewed  CBC WITH DIFFERENTIAL/PLATELET - Abnormal; Notable for the following components:      Result Value   RBC 3.52 (*)    Hemoglobin 11.1 (*)    HCT 34.5 (*)    RDW 16.1 (*)    All other components within normal limits  BASIC METABOLIC PANEL - Abnormal; Notable for the following components:   Glucose, Bld 106 (*)    BUN 32 (*)    Creatinine, Ser 2.78 (*)    GFR calc non Af Amer 23 (*)    GFR calc Af Amer 27 (*)    All other components within normal limits  BRAIN NATRIURETIC PEPTIDE - Abnormal; Notable for the following components:   B Natriuretic Peptide 597.0 (*)    All other components within normal limits  URINALYSIS, ROUTINE W REFLEX MICROSCOPIC - Abnormal; Notable for the following components:   Color, Urine COLORLESS (*)    All other components within normal limits  TROPONIN I (HIGH SENSITIVITY) - Abnormal; Notable  for the following components:   Troponin I (High Sensitivity) 18 (*)    All other components within normal limits  TROPONIN I (HIGH SENSITIVITY)    EKG EKG Interpretation  Date/Time:  Monday July 24 2019 09:15:42 EDT Ventricular Rate:  90 PR Interval:    QRS Duration: 94 QT Interval:  371 QTC Calculation: 454 R Axis:   -50 Text Interpretation: Sinus rhythm Inferior infarct, old Probable anterior infarct, age indeterminate Similar to Mar 3rd,  2021 tracing No STEMI Confirmed by Nanda Quinton [phone]) on 07/24/2019 9:18:55 AM   Radiology DG Chest Portable 1 View  Result Date: 07/24/2019 CLINICAL DATA:  Increasing shortness of breath EXAM: PORTABLE CHEST 1 VIEW COMPARISON:  07/05/2019 FINDINGS: Cardiac shadow is stable. Mild central vascular prominence is again seen and stable. No focal infiltrate or effusion is noted. No acute bony abnormality is seen. IMPRESSION: Stable appearance of the chest from the prior exam. No new focal abnormality is seen. Electronically Signed   By: Inez Catalina M.D.   On: 07/24/2019 09:51    Procedures Procedures (including critical care time)  Medications Ordered in ED Medications  furosemide (LASIX) injection 40 mg (40 mg Intravenous Given 07/24/19 1113)    ED Course  I have reviewed the triage vital signs and the nursing notes.  Pertinent labs & imaging results that were available during my care of the patient were reviewed by me and considered in my medical decision making (see chart for details).  Clinical Course as of Jul 23 1325  Mon  Jul 24, 2019  1043 Patient ambulated and felt short of breath with de sat to 82% on room air. When back in bed he improved to 96% on room air.Will give IV lasix and reasses   [AG]  5364 Patient with good urine output after receiving IV Lasix.  Patient ambulated without desaturation, was 96-100% on room air.   [KA]    Clinical Course User Index [KA] Tripton Ned, Harley Hallmark, PA-C   MDM Rules/Calculators/A&P                       Patient seen and examined. Patient presents awake, alert, hemodynamically stable, afebrile, non toxic.  No hypoxia or tachycardia.  On exam patient's lungs are clear to auscultation all fields, no wheezing, rales or rhonchi heard.  He does have 1+ bilateral lower extremity edema.  During the exam he was not hypoxic but when ambulating he was hypoxic to 82% on room air.  That improved after getting back in bed to 96%.  CBC here without leukocytosis, hemoglobin 11.1 seem consistent with his baseline.  BMP without severe electrolyte derangement, BUN/creatinine also seems consistent with his baseline.  BNP is elevated at 597 however this seems to be improved compared to last ED visit x 2 weeks ago when it was 792.  First troponin 18, second 17, doubt ACS as cause especially given his lack of chest pain and flat troponin.  Urine without any signs of infection. I viewed pt's chest xray and it does not suggest acute infectious processes. There does appear to be central vascular prominence is again seen and stable compared to last ED visit. EKG without ischemic changes. Patient given 40 mg IV Lasix with good urine output.  He was ambulated again without any desaturation, his SPO2 stayed 96% and above on room air.    Given his significant improvement after IV diuresis feel that he can be discharged home.  Would recommend doubling p.o. Lasix dose x2 days and then resuming normal regimen.  Feel that PE is unlikely as patient had such improvement after IV diuresis.  The patient appears reasonably screened and/or stabilized for discharge and I doubt any other medical condition or other [facility] requiring further screening, evaluation, or treatment in the ED at this time prior to discharge. The patient is safe for discharge with strict return precautions discussed. Recommend close cardiology follow-up which patient appears reliable for. The patient was discussed with and seen by Dr. Laverta Baltimore  who agrees with the  treatment plan.  Portions of this note were generated with [employer]. Dictation errors may occur despite best attempts at proofreading.   Final Clinical Impression(s) / ED Diagnoses Final diagnoses:  Acute on chronic congestive heart failure, unspecified heart failure type [facility])    Rx / DC Orders ED Discharge Orders    None       Flint Melter 07/24/19 1327    LongWonda Olds, MD 07/24/19 1935

## 2019-07-24 NOTE — ED Notes (Signed)
Ambulated in hall, Sats 95-100% on RA, denies SOB, tolerated well.

## 2019-07-24 NOTE — Progress Notes (Addendum)
Incomplete Session Note  Patient Details  Name: [name] MRN: [id number] Date of Birth: Mar 24, 1956 Referring Provider:     CARDIAC REHAB PHASE II ORIENTATION from 06/19/2019 in [place]  Referring Provider  Dr. Dartha Lodge did not complete his rehab session.  Patient has a 13 lbs weight gain since last Monday's weight on 07/17/19.He also complained of SOB and admitted putting salt on greens he ate over the weekend. He has been instructed by HF clinic to call with a weight gain of 3 lbs in one day or 5 lbs in one week. He was instructed to call the HF clinic and report this weight gain. He verbalized understanding and said he would call as soon as he got home.

## 2019-07-24 NOTE — ED Notes (Signed)
Sats dropped to 82% on RA during ambulation.  Pt c/o SOB during walk.  Denies chest pain.

## 2019-07-24 NOTE — Discharge Instructions (Addendum)
You have been seen today for shortness of breath. Please read and follow all provided instructions. Return to the emergency room for worsening condition or new concerning symptoms.    1. Medications:  Increase your lasix to 40mg  twice daily for 2 days. Then resume 40 mg once daily as we discussed   Continue usual home medications  Take medications as prescribed. Please review all of the medicines and only take them if you do not have an allergy to them.   2. Treatment: rest. Please decrease your salt intake  3. Follow Up:  Please follow up with cardiology by scheduling an appointment as soon as possible for a visit     It is also a possibility that you have an allergic reaction to any of the medicines that you have been prescribed - Everybody reacts differently to medications and while MOST people have no trouble with most medicines, you may have a reaction such as nausea, vomiting, rash, swelling, shortness of breath. If this is the case, please stop taking the medicine immediately and contact your physician.  ?

## 2019-07-26 ENCOUNTER — Encounter (HOSPITAL_COMMUNITY): Payer: Medicare Other

## 2019-07-26 NOTE — Progress Notes (Signed)
Cardiac Individual Treatment Plan  Patient Details  Name: [name] MRN: [id number] Date of Birth: 1955-07-23 Referring Provider:     CARDIAC REHAB PHASE II ORIENTATION from 06/19/2019 in [place]  Referring Provider  [name]      Initial Encounter Date:    CARDIAC REHAB PHASE II ORIENTATION from 06/19/2019 in [place]  Date  06/19/19      Visit Diagnosis: ST elevation myocardial infarction (STEMI), unspecified artery ([place])  Status post coronary artery stent placement  Patient's Home Medications on Admission:  Current Outpatient Medications:  .  allopurinol (ZYLOPRIM) 100 MG tablet, Take 100 mg by mouth daily. , Disp: , Rfl:  .  aspirin 81 MG chewable tablet, Chew 1 tablet (81 mg total) by mouth daily., Disp: 90 tablet, Rfl: 1 .  atorvastatin (LIPITOR) 80 MG tablet, Take 1 tablet (80 mg total) by mouth daily at 6 PM., Disp: 90 tablet, Rfl: 1 .  BIDIL 20-37.5 MG tablet, Take 1 tablet by mouth twice daily (Patient taking differently: Take 1 tablet by mouth 2 (two) times daily. ), Disp: 60 tablet, Rfl: 1 .  carvedilol (COREG) 25 MG tablet, Take 12.5 mg by mouth 2 (two) times daily with a meal. , Disp: , Rfl:  .  furosemide (LASIX) 40 MG tablet, Take 1 tablet (40 mg total) by mouth daily., Disp: 35 tablet, Rfl: 5 .  nitroGLYCERIN (NITROSTAT) 0.4 MG SL tablet, Place 1 tablet (0.4 mg total) under the tongue every 5 (five) minutes as needed., Disp: 25 tablet, Rfl: 2 .  sodium bicarbonate 650 MG tablet, Take 650 mg by mouth 3 (three) times daily., Disp: , Rfl:  .  ticagrelor (BRILINTA) 90 MG TABS tablet, Take 1 tablet (90 mg total) by mouth 2 (two) times daily., Disp: 180 tablet, Rfl: 1  Past Medical History: Past Medical History:  Diagnosis Date  . CHF (congestive heart failure) ([place])   . CKD (chronic kidney disease) stage 4, GFR 15-29 ml/min ([facility])   . Heart attack ([place]) 03/15/2019  . Hypertension 2002  . Kidney stones   .  Left knee pain     Tobacco Use: Social History   Tobacco Use  Smoking Status Former Smoker  . Packs/day: 0.50  . Years: 40.00  . Pack years: 20.00  . Types: Cigarettes  . Quit date: 03/12/2019  . Years since quitting: 0.3  Smokeless Tobacco Never Used    Labs: Recent Review Scientist, physiological    Labs for ITP Cardiac and Pulmonary Rehab Latest Ref Rng & Units 05/28/2016 03/12/2019 03/13/2019   Cholestrol 0 - 200 mg/dL 204(H) 193 -   LDLCALC 0 - 99 mg/dL 121(H) 114(H) -   HDL >40 mg/dL 48 42 -   Trlycerides <150 mg/dL 173(H) 184(H) -   Hemoglobin A1c 4.8 - 5.6 % 5.0 - 5.4   TCO2 22 - 32 mmol/L - 20(L) -      Capillary Blood Glucose: Lab Results  Component Value Date   GLUCAP 115 (H) 04/04/2019   GLUCAP 168 (H) 03/12/2019   GLUCAP 92 05/19/2016     Exercise Target Goals: Exercise Program Goal: Individual exercise prescription set using results from initial 6 min walk test and THRR while considering  patient's activity barriers and safety.   Exercise Prescription Goal: Starting with aerobic activity 30 plus minutes a day, 3 days per week for initial exercise prescription. Provide home exercise prescription and guidelines that participant acknowledges understanding prior to discharge.  Activity Barriers &  Risk Stratification: Activity Barriers & Cardiac Risk Stratification - 06/19/19 1134      Activity Barriers & Cardiac Risk Stratification   Activity Barriers  Deconditioning   (L) knee problems. He needs a replacement.   Cardiac Risk Stratification  High       6 Minute Walk: 6 Minute Walk    Row Name 06/19/19 1132         6 Minute Walk   Phase  Initial     Distance  1200 feet     Walk Time  6 minutes     # of Rest Breaks  0     MPH  2.27     METS  2.74     RPE  10     Perceived Dyspnea   11     VO2 Peak  12.31     Symptoms  No     Resting HR  83 bpm     Resting BP  90/62     Resting Oxygen Saturation   99 %     Exercise Oxygen Saturation  during 6 min  walk  96 %     Max Ex. HR  114 bpm     Max Ex. BP  128/78     2 Minute Post BP  90/78        Oxygen Initial Assessment:   Oxygen Re-Evaluation:   Oxygen Discharge (Final Oxygen Re-Evaluation):   Initial Exercise Prescription: Initial Exercise Prescription - 06/19/19 1100      Date of Initial Exercise RX and Referring Provider   Date  06/19/19    Referring Provider  [name]    Expected Discharge Date  09/16/19      Treadmill   MPH  1.3    Grade  0    Minutes  17    METs  1.99      Recumbant Elliptical   Level  1    RPM  40    Watts  28    Minutes  20    METs  2.2      Prescription Details   Frequency (times per week)  3    Duration  Progress to 30 minutes of continuous aerobic without signs/symptoms of physical distress      Intensity   THRR 40-80% of Max Heartrate  [phone]    Ratings of Perceived Exertion  11-13    Perceived Dyspnea  0-4      Progression   Progression  Continue to progress workloads to maintain intensity without signs/symptoms of physical distress.      Resistance Training   Training Prescription  Yes    Weight  1    Reps  10-15       Perform Capillary Blood Glucose checks as needed.  Exercise Prescription Changes:  Exercise Prescription Changes    Row Name 07/04/19 1500 07/05/19 1400 07/25/19 0700         Response to Exercise   Blood Pressure (Admit)  110/88  118/100  100/60     Blood Pressure (Exercise)  110/80  122/102  110/70     Blood Pressure (Exit)  104/84  -  94/60     Heart Rate (Admit)  90 bpm  98 bpm  84 bpm     Heart Rate (Exercise)  105 bpm  105 bpm  105 bpm     Heart Rate (Exit)  99 bpm  -  93 bpm     Rating of Perceived Exertion (  Exercise)  10  -  -     Symptoms  Knee Pain 7/10  extreme SOB  Fluid retention     Comments  -  Due to his knee had to change Exercise Prescription to Arm Crank and Nustep  Due to his knee had to change Exercise Prescription to Arm Crank and Nustep     Duration  Continue with 30  min of aerobic exercise without signs/symptoms of physical distress.  Continue with 30 min of aerobic exercise without signs/symptoms of physical distress.  Continue with 30 min of aerobic exercise without signs/symptoms of physical distress.     Intensity  THRR unchanged  THRR unchanged  THRR unchanged       Progression   Progression  Continue to progress workloads to maintain intensity without signs/symptoms of physical distress.  Continue to progress workloads to maintain intensity without signs/symptoms of physical distress.  Continue to progress workloads to maintain intensity without signs/symptoms of physical distress.     Average METs  1.65  1.65  2.07       Resistance Training   Training Prescription  Yes  Yes  Yes     Weight  2  2  2      Reps  10-15  10-15  10-15       Treadmill   MPH  1.4  -  1.4     Grade  0  -  0     Minutes  17  -  17     METs  2.07  -  2.07       Arm Ergometer   Level  -  1  1.2     Watts  -  25  11     Minutes  -  17  22     METs  -  2  1.8       Recumbant Elliptical   Level  1  -  -     RPM  38  -  -     Watts  46  -  -     Minutes  22  -  -     METs  2.5  -  -       T5 Nustep   Level  -  1  -     SPM  -  50  -     Minutes  -  22  -     METs  -  2  -       Home Exercise Plan   Plans to continue exercise at  Home (comment)  Home (comment)  Home (comment)     Frequency  Add 2 additional days to program exercise sessions.  Add 2 additional days to program exercise sessions.  Add 2 additional days to program exercise sessions.     Initial Home Exercises Provided  06/19/19  06/19/19  06/19/19        Exercise Comments:  Exercise Comments    Row Name 06/19/19 1147 07/04/19 1531 07/25/19 0801       Exercise Comments  This was patient orientation/assessment visit. He is looking forward to attending to continue to get stronger and to get mobility back to 100%.  This is patients 4th visit. He is coming regularily and was progressing. Will have  to watch his knee pain and if the current exercise prescription needs to be ajusted to elivate his current knee pain and swelling.  We will continue to progress  his workload as tolerated.        Exercise Goals and Review:  Exercise Goals    Row Name 06/19/19 1146             Exercise Goals   Increase Physical Activity  Yes       Intervention  Provide advice, education, support and counseling about physical activity/exercise needs.;Develop an individualized exercise prescription for aerobic and resistive training based on initial evaluation findings, risk stratification, comorbidities and participant's personal goals.       Expected Outcomes  Long Term: Add in home exercise to make exercise part of routine and to increase amount of physical activity.;Short Term: Attend rehab on a regular basis to increase amount of physical activity.       Increase Strength and Stamina  Yes       Intervention  Provide advice, education, support and counseling about physical activity/exercise needs.;Develop an individualized exercise prescription for aerobic and resistive training based on initial evaluation findings, risk stratification, comorbidities and participant's personal goals.       Expected Outcomes  Short Term: Perform resistance training exercises routinely during rehab and add in resistance training at home;Long Term: Improve cardiorespiratory fitness, muscular endurance and strength as measured by increased METs and functional capacity (6MWT)       Able to understand and use rate of perceived exertion (RPE) scale  Yes       Intervention  Provide education and explanation on how to use RPE scale       Expected Outcomes  Short Term: Able to use RPE daily in rehab to express subjective intensity level;Long Term:  Able to use RPE to guide intensity level when exercising independently       Able to understand and use Dyspnea scale  Yes       Intervention  Provide education and explanation on how to use  Dyspnea scale       Expected Outcomes  Short Term: Able to use Dyspnea scale daily in rehab to express subjective sense of shortness of breath during exertion;Long Term: Able to use Dyspnea scale to guide intensity level when exercising independently       Knowledge and understanding of Target Heart Rate Range (THRR)  Yes       Intervention  Provide education and explanation of THRR including how the numbers were predicted and where they are located for reference       Expected Outcomes  Short Term: Able to use daily as guideline for intensity in rehab;Long Term: Able to use THRR to govern intensity when exercising independently       Able to check pulse independently  Yes       Intervention  Provide education and demonstration on how to check pulse in carotid and radial arteries.;Review the importance of being able to check your own pulse for safety during independent exercise       Expected Outcomes  Short Term: Able to explain why pulse checking is important during independent exercise;Long Term: Able to check pulse independently and accurately       Understanding of Exercise Prescription  Yes       Intervention  Provide education, explanation, and written materials on patient's individual exercise prescription       Expected Outcomes  Short Term: Able to explain program exercise prescription;Long Term: Able to explain home exercise prescription to exercise independently          Exercise Goals Re-Evaluation : Exercise Goals Re-Evaluation  [place] Name 07/04/19 1527 07/25/19 0758           Exercise Goal Re-Evaluation   Exercise Goals Review  Increase Physical Activity;Increase Strength and Stamina  -      Comments  On patients last visit he c/o knee pain and swelling 7/10. We may have to switch the equipment that he is using if the TM and Ellipical continue to aggrivate his knee. Will cintinue to assess.  This is patients 9th visit in the program. He has been retaining fluid on a regular  basis. He has had 2 trips to the ER since starting the program due to fluid retenion. He has been instructed on ways in which to change his diet to help to keep his fluid down. He has expressed an understanding.      Expected Outcomes  To be able to progress as tolerated.  To keep his fluid at a healthful level so that he does not experienced SOB.          Discharge Exercise Prescription (Final Exercise Prescription Changes): Exercise Prescription Changes - 07/25/19 0700      Response to Exercise   Blood Pressure (Admit)  100/60    Blood Pressure (Exercise)  110/70    Blood Pressure (Exit)  94/60    Heart Rate (Admit)  84 bpm    Heart Rate (Exercise)  105 bpm    Heart Rate (Exit)  93 bpm    Symptoms  Fluid retention    Comments  Due to his knee had to change Exercise Prescription to Arm Crank and Nustep    Duration  Continue with 30 min of aerobic exercise without signs/symptoms of physical distress.    Intensity  THRR unchanged      Progression   Progression  Continue to progress workloads to maintain intensity without signs/symptoms of physical distress.    Average METs  2.07      Resistance Training   Training Prescription  Yes    Weight  2    Reps  10-15      Treadmill   MPH  1.4    Grade  0    Minutes  17    METs  2.07      Arm Ergometer   Level  1.2    Watts  11    Minutes  22    METs  1.8      Home Exercise Plan   Plans to continue exercise at  Home (comment)    Frequency  Add 2 additional days to program exercise sessions.    Initial Home Exercises Provided  06/19/19       Nutrition:  Target Goals: Understanding of nutrition guidelines, daily intake of sodium 1500mg , cholesterol 200mg , calories 30% from fat and 7% or less from saturated fats, daily to have 5 or more servings of fruits and vegetables.  Biometrics: Pre Biometrics - 06/19/19 1148      Pre Biometrics   Height  6' (1.829 m)    Weight  86.5 kg    Waist Circumference  40.5 inches    Hip  Circumference  38 inches    Waist to Hip Ratio  1.07 %    BMI (Calculated)  25.86    Triceps Skinfold  6 mm    % Body Fat  22.9 %    Grip Strength  37.9 kg    Flexibility  0 in    Single Leg Stand  15.37 seconds  Nutrition Therapy Plan and Nutrition Goals: Nutrition Therapy & Goals - 07/26/19 1606      Personal Nutrition Goals   Additional Goals?  No    Comments  We are currently working on scheduling patients to meet with RD. Patient is supposed to be following a low Na diet and says he is trying but admitts to not always complying. Will encourage to meet with RD and continue to monitor for progress.      Intervention Plan   Intervention  Nutrition handout(s) given to patient.       Nutrition Assessments: Nutrition Assessments - 06/19/19 1151      MEDFICTS Scores   Pre Score  3       Nutrition Goals Re-Evaluation:   Nutrition Goals Discharge (Final Nutrition Goals Re-Evaluation):   Psychosocial: Target Goals: Acknowledge presence or absence of significant depression and/or stress, maximize coping skills, provide positive support system. Participant is able to verbalize types and ability to use techniques and skills needed for reducing stress and depression.  Initial Review & Psychosocial Screening: Initial Psych Review & Screening - 06/19/19 1150      Initial Review   Current issues with  None Identified      Family Dynamics   Good Support System?  Yes      Barriers   Psychosocial barriers to participate in program  There are no identifiable barriers or psychosocial needs.      Screening Interventions   Interventions  Encouraged to exercise       Quality of Life Scores: Quality of Life - 06/19/19 1026      Quality of Life   Select  Quality of Life      Quality of Life Scores   Health/Function Pre  23.67 %    Socioeconomic Pre  25 %    Psych/Spiritual Pre  27.92 %    Family Pre  21.75 %    GLOBAL Pre  24.54 %      Scores of 19 and below  usually indicate a poorer quality of life in these areas.  A difference of  2-3 points is a clinically meaningful difference.  A difference of 2-3 points in the total score of the Quality of Life Index has been associated with significant improvement in overall quality of life, self-image, physical symptoms, and general health in studies assessing change in quality of life.  PHQ-9: Recent Review Flowsheet Data    Depression screen [facility] 2/9 06/19/2019 04/11/2019 03/21/2019   Decreased Interest 0 0 0   Down, Depressed, Hopeless 0 0 0   PHQ - 2 Score 0 0 0   Altered sleeping 0 - -   Tired, decreased energy 0 - -   Change in appetite 0 - -   Feeling bad or failure about yourself  0 - -   Trouble concentrating 0 - -   Moving slowly or fidgety/restless 0 - -   Suicidal thoughts 0 - -   PHQ-9 Score 0 - -     Interpretation of Total Score  Total Score Depression Severity:  1-4 = Minimal depression, 5-9 = Mild depression, 10-14 = Moderate depression, 15-19 = Moderately severe depression, 20-27 = Severe depression   Psychosocial Evaluation and Intervention:   Psychosocial Re-Evaluation: Psychosocial Re-Evaluation    [place] Name 07/05/19 1026 07/26/19 1608           Psychosocial Re-Evaluation   Current issues with  None Identified  None Identified      Comments  Patient's initial QOL score ws 24.54 and his PHQ-9 score was 0 with no psychosocial issued identified.  Patient's initial QOL score ws 24.54 and his PHQ-9 score was 0 with no psychosocial issued identified.      Expected Outcomes  Patient will have no psychosocial issued identified at discharge.  Patient will have no psychosocial issued identified at discharge.      Interventions  Stress management education;Encouraged to attend Cardiac Rehabilitation for the exercise;Relaxation education  Stress management education;Encouraged to attend Cardiac Rehabilitation for the exercise;Relaxation education      Continue Psychosocial Services   No  Follow up required  No Follow up required         Psychosocial Discharge (Final Psychosocial Re-Evaluation): Psychosocial Re-Evaluation - 07/26/19 1608      Psychosocial Re-Evaluation   Current issues with  None Identified    Comments  Patient's initial QOL score ws 24.54 and his PHQ-9 score was 0 with no psychosocial issued identified.    Expected Outcomes  Patient will have no psychosocial issued identified at discharge.    Interventions  Stress management education;Encouraged to attend Cardiac Rehabilitation for the exercise;Relaxation education    Continue Psychosocial Services   No Follow up required       Vocational Rehabilitation: Provide vocational rehab assistance to qualifying candidates.   Vocational Rehab Evaluation & Intervention: Vocational Rehab - 06/19/19 1151      Initial Vocational Rehab Evaluation & Intervention   Assessment shows need for Vocational Rehabilitation  No       Education: Education Goals: Education classes will be provided on a weekly basis, covering required topics. Participant will state understanding/return demonstration of topics presented.  Learning Barriers/Preferences: Learning Barriers/Preferences - 06/19/19 1151      Learning Barriers/Preferences   Learning Barriers  None    Learning Preferences  Individual Instruction;Group Instruction;Skilled Demonstration       Education Topics: Hypertension, Hypertension Reduction -Define heart disease and high blood pressure. Discus how high blood pressure affects the body and ways to reduce high blood pressure.   Exercise and Your Heart -Discuss why it is important to exercise, the FITT principles of exercise, normal and abnormal responses to exercise, and how to exercise safely.   Angina -Discuss definition of angina, causes of angina, treatment of angina, and how to decrease risk of having angina.   Cardiac Medications -Review what the following cardiac medications are used for,  how they affect the body, and side effects that may occur when taking the medications.  Medications include Aspirin, Beta blockers, calcium channel blockers, ACE Inhibitors, angiotensin receptor blockers, diuretics, digoxin, and antihyperlipidemics.   Congestive Heart Failure -Discuss the definition of CHF, how to live with CHF, the signs and symptoms of CHF, and how keep track of weight and sodium intake.   Heart Disease and Intimacy -Discus the effect sexual activity has on the heart, how changes occur during intimacy as we age, and safety during sexual activity.   Smoking Cessation / COPD -Discuss different methods to quit smoking, the health benefits of quitting smoking, and the definition of COPD.   Nutrition I: Fats -Discuss the types of cholesterol, what cholesterol does to the heart, and how cholesterol levels can be controlled.   Nutrition II: Labels -Discuss the different components of food labels and how to read food label   CARDIAC REHAB PHASE II EXERCISE from 07/19/2019 in [place]  Date  07/05/19  Educator  Etheleen Mayhew  Instruction Review Code  2- Demonstrated  Understanding      Heart Parts/Heart Disease and PAD -Discuss the anatomy of the heart, the pathway of blood circulation through the heart, and these are affected by heart disease.   Stress I: Signs and Symptoms -Discuss the causes of stress, how stress may lead to anxiety and depression, and ways to limit stress.   CARDIAC REHAB PHASE II EXERCISE from 07/19/2019 in [place]  Date  07/19/19  Educator  Etheleen Mayhew  Instruction Review Code  2- Demonstrated Understanding      Stress II: Relaxation -Discuss different types of relaxation techniques to limit stress.   Warning Signs of Stroke / TIA -Discuss definition of a stroke, what the signs and symptoms are of a stroke, and how to identify when someone is having stroke.   Knowledge Questionnaire  Score: Knowledge Questionnaire Score - 06/19/19 1151      Knowledge Questionnaire Score   Pre Score  18/24       Core Components/Risk Factors/Patient Goals at Admission: Personal Goals and Risk Factors at Admission - 06/19/19 1151      Core Components/Risk Factors/Patient Goals on Admission    Weight Management  Weight Maintenance    Personal Goal Other  Yes    Personal Goal  Patient wants to get mobility back to 100% and to be able to maintain that level of strength.    Intervention  Atten CR 3 x week and to supplement with walking 2 x week at home.    Expected Outcomes  Reach expected outcomes       Core Components/Risk Factors/Patient Goals Review:  Goals and Risk Factor Review    Row Name 07/05/19 1021 07/26/19 1601           Core Components/Risk Factors/Patient Goals Review   Personal Goals Review  Weight Management/Obesity;Other Get mobility back 100%; keep mobility.  Weight Management/Obesity;Other Get mobility back to 100%. Keep his mobility.      Review  Patient has completed 5 sessions gaining 5 lbs since his initial visit. He is new to the program. He complained of SOB today during his session. He has an echocardiogram completed today. Dr. Letha Cape office was notified and patient was advised to go to the ED. Will continue to monitor patient's progress.  Patient has completed 9 sessions with fluctuating weight. He was not allowed to complete his session 07/21/19 due to a 10 lb weight gain in one week and noted and reported SOB. Patient was advised to call the HF clinic when he got home and report his weight gain as he had been instructed. He choose to go to the ED due to his increased SOB after he left CR. He was evaluated and treated with IV Furosemide with improvement in symptoms. Patient is to f/u in HF clinic tomorrow 3/25. He admitts to adding salt to his food over the weekend and has been instructed on a low salt diet. He plans to return to the program 07/31/19. Will  continue to monitor for progress toward meeting his goals once he returns.      Expected Outcomes  Patient will continue to attend sessions and complete the program meeting his personal goals.  Patient will continue to attend sessions and complete the program meeting his personal goals.         Core Components/Risk Factors/Patient Goals at Discharge (Final Review):  Goals and Risk Factor Review - 07/26/19 1601      Core Components/Risk Factors/Patient Goals Review   Personal Goals Review  Weight Management/Obesity;Other   Get mobility back to 100%. Keep his mobility.   Review  Patient has completed 9 sessions with fluctuating weight. He was not allowed to complete his session 07/21/19 due to a 10 lb weight gain in one week and noted and reported SOB. Patient was advised to call the HF clinic when he got home and report his weight gain as he had been instructed. He choose to go to the ED due to his increased SOB after he left CR. He was evaluated and treated with IV Furosemide with improvement in symptoms. Patient is to f/u in HF clinic tomorrow 3/25. He admitts to adding salt to his food over the weekend and has been instructed on a low salt diet. He plans to return to the program 07/31/19. Will continue to monitor for progress toward meeting his goals once he returns.    Expected Outcomes  Patient will continue to attend sessions and complete the program meeting his personal goals.       ITP Comments: ITP Comments    Row Name 06/19/19 1131 07/14/19 0909         ITP Comments  Patient is eager to get started. He is coming by way of [facility].  Patient was evaluated in the ED 07/05/19 for SOB and diagnosed with acute on chronic CHF. He was instructed to f/u with the HF clinic which was 3/10. They increased his Lasix to 40 mg daily. Will continue to monitor.         Comments: ITP REVIEW Pt is making expected progress toward Cardiac Rehab goals after completing 9 sessions. Recommend continued  exercise, life style modification, education, and increased stamina and strength.

## 2019-07-27 ENCOUNTER — Ambulatory Visit (INDEPENDENT_AMBULATORY_CARE_PROVIDER_SITE_OTHER): Payer: Medicare Other | Admitting: Family Medicine

## 2019-07-27 ENCOUNTER — Other Ambulatory Visit: Payer: Self-pay

## 2019-07-27 ENCOUNTER — Encounter: Payer: Self-pay | Admitting: Family Medicine

## 2019-07-27 VITALS — BP 110/82 | HR 93 | Temp 97.6°F | Resp 16 | Ht 72.0 in | Wt 193.0 lb

## 2019-07-27 DIAGNOSIS — I10 Essential (primary) hypertension: Secondary | ICD-10-CM

## 2019-07-27 DIAGNOSIS — N184 Chronic kidney disease, stage 4 (severe): Secondary | ICD-10-CM

## 2019-07-27 DIAGNOSIS — I509 Heart failure, unspecified: Secondary | ICD-10-CM

## 2019-07-27 DIAGNOSIS — E785 Hyperlipidemia, unspecified: Secondary | ICD-10-CM | POA: Diagnosis not present

## 2019-07-27 DIAGNOSIS — I2102 ST elevation (STEMI) myocardial infarction involving left anterior descending coronary artery: Secondary | ICD-10-CM

## 2019-07-27 DIAGNOSIS — R972 Elevated prostate specific antigen [PSA]: Secondary | ICD-10-CM

## 2019-07-27 NOTE — Assessment & Plan Note (Addendum)
Has follow-up with nephrology in the next coming week. He is encouraged to refrain from all nephrotoxic medications including NSAIDs.

## 2019-07-27 NOTE — Assessment & Plan Note (Signed)
Is followed by cardiology.  Has not had any events since this.  Overall is doing well.  Denies having any chest pain today in the office.

## 2019-07-27 NOTE — Assessment & Plan Note (Signed)
Will need updated labs on this.

## 2019-07-27 NOTE — Patient Instructions (Addendum)
I appreciate the opportunity to provide you with care for your health and wellness. Today we discussed: establish care   Follow up: Aug/Sept for annual appt with vision will order labs that day  No labs or referrals today  Call [facility] for eye exam  Please continue to practice social distancing to keep you, your family, and our community safe.  If you must go out, please wear a mask and practice good handwashing.  It was a pleasure to see you and I look forward to continuing to work together on your health and well-being. Please do not hesitate to call the office if you need care or have questions about your care.  Have a wonderful day and week. With Gratitude, Cherly Beach, DNP, AGNP-BC

## 2019-07-27 NOTE — Assessment & Plan Note (Signed)
He is on Lipitor at this time.  We will get updated labs at his annual appointment.  Is followed by cardiology advised for him to continue taking Lipitor and to maintain a heart healthy low-fat diet.

## 2019-07-27 NOTE — Assessment & Plan Note (Addendum)
Is followed by cardiology for this, Dr. Haroldine Laws with the heart failure clinic. No signs or symptoms of heart failure today in the office.  Encouraged to continue taking his medications as directed.  And if he is develop any shortness of breath or any chest pain to go to the nearest emergency room.

## 2019-07-27 NOTE — Progress Notes (Signed)
Subjective:  Patient ID: [name], male    DOB: March 25, 1956  Age: 64 y.o. MRN: [id number]  CC:  Chief Complaint  Patient presents with  . Establish Care    new pt.       HPI  HPI [name] is a 64 year old African-American male that presents today to establish care.  Previously seen by Dr. Holly Bodily over the last 6 months. Has a history of CHF, CKD, MI that occurred in November 2020, hypertension.  Is followed by cardiology.  And has an upcoming appointment with nephrology. He reports he has partials and has not seen a dentist in a while though he might need to see them to make sure that they still fit appropriately or get new ones.  Needs an eye exam he is open to going to my eye doctor for this.  Health maintenance wise he appears to need a colonoscopy possibly in the near future.  Had an elevated PSA but he reports that nothing ever came out of that they checked him out and he was fine.  Did receive the Sarles vaccine on Sunday, March 21 reports that he is doing well with that.  Just little bit of arm soreness.  He reports that he does not follow the best diet in the past with salt and fried foods but has been working tremendously since he had his heart attack in November to turn his diet around.  He reports he is increase the amount of veggies he takes in and is starting to look at the fact that he has to stop using salt and rely on other items for flavor.  He reports that Cardiology Dr with the heart failure clinic has talked to him about having a defibrillator placed.  He is open to this to make sure that he has good heart health. Chart review shows patient had echo on 07/05/2019 that showed LVEF of 20 to 25%. His cardiologist is Dr. Haroldine Laws.  He declines having any issues of concern to talk about today he reports taking all of his medications without any issues.  He denies having any chest pain, leg swelling, cough, dizziness, headaches. He reports that  he does have a little bit of shortness of breath but that is greatly decreased with the use of Lasix.  Today patient denies signs and symptoms of COVID 19 infection including fever, chills, cough, shortness of breath, and headache. Past Medical, Surgical, Social History, Allergies, and Medications have been Reviewed.   Past Medical History:  Diagnosis Date  . Acute ST elevation myocardial infarction (STEMI) due to occlusion of mid portion of left anterior descending (LAD) coronary artery ([place]) 03/12/2019  . Acute systolic heart failure ([place])   . CHF (congestive heart failure) ([place])   . CKD (chronic kidney disease) stage 4, GFR 15-29 ml/min (HCC)   . Heart attack ([place]) 03/15/2019  . Hypertension 2002  . Kidney stones   . Left knee pain   . Syncope 04/04/2019    Current Meds  Medication Sig  . allopurinol (ZYLOPRIM) 100 MG tablet Take 100 mg by mouth daily.   Marland Kitchen aspirin 81 MG chewable tablet Chew 1 tablet (81 mg total) by mouth daily.  Marland Kitchen atorvastatin (LIPITOR) 80 MG tablet Take 1 tablet (80 mg total) by mouth daily at 6 PM.  . BIDIL 20-37.5 MG tablet Take 1 tablet by mouth twice daily (Patient taking differently: Take 1 tablet by mouth 2 (two) times daily. )  . carvedilol (  COREG) 25 MG tablet Take 12.5 mg by mouth 2 (two) times daily with a meal.   . furosemide (LASIX) 40 MG tablet Take 1 tablet (40 mg total) by mouth daily.  . nitroGLYCERIN (NITROSTAT) 0.4 MG SL tablet Place 1 tablet (0.4 mg total) under the tongue every 5 (five) minutes as needed.  . sodium bicarbonate 650 MG tablet Take 650 mg by mouth 3 (three) times daily.  . ticagrelor (BRILINTA) 90 MG TABS tablet Take 1 tablet (90 mg total) by mouth 2 (two) times daily.    ROS:  Review of Systems  Constitutional: Negative.   HENT: Negative.   Eyes: Negative.   Respiratory: Negative.   Cardiovascular: Negative.   Gastrointestinal: Negative.   Genitourinary: Negative.   Musculoskeletal: Negative.   Skin: Negative.     Neurological: Negative.   Endo/Heme/Allergies: Negative.   Psychiatric/Behavioral: Negative.   All other systems reviewed and are negative.   Objective:   Today's Vitals: BP 110/82   Pulse 93   Temp 97.6 F (36.4 C) (Temporal)   Resp 16   Ht 6' (1.829 m)   Wt 193 lb (87.5 kg)   SpO2 96%   BMI 26.18 kg/m  Vitals with BMI 07/27/2019 07/24/2019 07/24/2019  Height 6\' 0"  - -  Weight 193 lbs - -  BMI 66.29 - -  Systolic 476 546 503  Diastolic 82 93 98  Pulse 93 99 89     Physical Exam Vitals and nursing note reviewed.  Constitutional:      Appearance: Normal appearance. He is well-developed, well-groomed and overweight.  HENT:     Head: Normocephalic and atraumatic.     Right Ear: External ear normal.     Left Ear: External ear normal.     Mouth/Throat:     Comments: Mask in place  Eyes:     General:        Right eye: No discharge.        Left eye: No discharge.     Conjunctiva/sclera: Conjunctivae normal.  Cardiovascular:     Rate and Rhythm: Normal rate and regular rhythm.     Pulses: Normal pulses.     Heart sounds: Normal heart sounds.  Pulmonary:     Effort: Pulmonary effort is normal.     Breath sounds: Normal breath sounds.  Musculoskeletal:        General: Normal range of motion.     Cervical back: Normal range of motion and neck supple.  Skin:    General: Skin is warm.  Neurological:     General: No focal deficit present.     Mental Status: He is alert and oriented to person, place, and time.  Psychiatric:        Attention and Perception: Attention normal.        Mood and Affect: Mood normal.        Speech: Speech normal.        Behavior: Behavior normal. Behavior is cooperative.        Thought Content: Thought content normal.        Cognition and Memory: Cognition normal.        Judgment: Judgment normal.      Assessment   1. Other congestive heart failure ([place])   2. ST elevation myocardial infarction involving left anterior descending (LAD)  coronary artery (HCC)   3. Stage 4 chronic kidney disease ([place])   4. Hyperlipidemia, unspecified hyperlipidemia type   5. Essential hypertension   6. Elevated PSA  Tests ordered No orders of the defined types were placed in this encounter.    Plan: Please see assessment and plan per problem list above.   No orders of the defined types were placed in this encounter.   Patient to follow-up in Aug/Sept for annual   Perlie Mayo, NP

## 2019-07-27 NOTE — Assessment & Plan Note (Signed)
Is followed by cardiology advised to continue all medications as directed.  Encouraged to maintain a DASH diet/low-sodium heart healthy diet.  That is also low in fat.  Additionally encouraged him to ambulate and get exercise outside of cardiac rehab as well.

## 2019-07-28 ENCOUNTER — Encounter (HOSPITAL_COMMUNITY): Payer: Medicare Other

## 2019-07-31 ENCOUNTER — Encounter (HOSPITAL_COMMUNITY)
Admission: RE | Admit: 2019-07-31 | Discharge: 2019-07-31 | Disposition: A | Discharge status: Home / Self Care | Payer: Medicare Other | Source: Ambulatory Visit | Attending: Interventional Cardiology | Admitting: Interventional Cardiology

## 2019-07-31 ENCOUNTER — Other Ambulatory Visit: Payer: Self-pay

## 2019-07-31 DIAGNOSIS — Z955 Presence of coronary angioplasty implant and graft: Secondary | ICD-10-CM

## 2019-07-31 DIAGNOSIS — I213 ST elevation (STEMI) myocardial infarction of unspecified site: Secondary | ICD-10-CM

## 2019-07-31 NOTE — Progress Notes (Signed)
Daily Session Note  Patient Details  Name: [name] MRN: [id number] Date of Birth: [birth year] Referring Provider:     CARDIAC REHAB PHASE II ORIENTATION from 06/19/2019 in [place]  Referring Provider  [name]      Encounter Date: 07/31/2019  Check In: Session Check In - 07/31/19 0815      Check-In   Supervising physician immediately available to respond to emergencies  See telemetry face sheet for immediately available MD    Location  [facility]    Staff Present  Aundra Dubin, RN, BSN;Diane Coad, MS, EP, [facility], Exercise Physiologist    Virtual Visit  No    Medication changes reported      No    Fall or balance concerns reported     No    Tobacco Cessation  No Change    Warm-up and Cool-down  Performed as group-led instruction    Resistance Training Performed  Yes    VAD Patient?  No    PAD/SET Patient?  No      Pain Assessment   Currently in Pain?  No/denies    Pain Score  0-No pain    Multiple Pain Sites  No       Capillary Blood Glucose: No results found for this or any previous visit (from the past 24 hour(s)).    Social History   Tobacco Use  Smoking Status Former Smoker  . Packs/day: 0.50  . Years: 40.00  . Pack years: 20.00  . Types: Cigarettes  . Quit date: 03/12/2019  . Years since quitting: 0.3  Smokeless Tobacco Never Used    Goals Met:  Independence with exercise equipment Exercise tolerated well No report of cardiac concerns or symptoms Strength training completed today  Goals Unmet:  Not Applicable  Comments: Check out 915.   Dr. [name] is Medical Director for [facility] Cardiac and Pulmonary Rehab.

## 2019-08-01 ENCOUNTER — Telehealth (HOSPITAL_COMMUNITY): Payer: Self-pay | Admitting: Cardiology

## 2019-08-01 ENCOUNTER — Other Ambulatory Visit: Payer: Self-pay

## 2019-08-01 ENCOUNTER — Encounter (HOSPITAL_COMMUNITY): Payer: Self-pay

## 2019-08-01 ENCOUNTER — Ambulatory Visit (HOSPITAL_COMMUNITY)
Admission: RE | Admit: 2019-08-01 | Discharge: 2019-08-01 | Disposition: A | Discharge status: Home / Self Care | Payer: Medicare Other | Source: Ambulatory Visit | Attending: Cardiology | Admitting: Cardiology

## 2019-08-01 VITALS — BP 128/86 | HR 96 | Wt 200.4 lb

## 2019-08-01 DIAGNOSIS — I255 Ischemic cardiomyopathy: Secondary | ICD-10-CM

## 2019-08-01 DIAGNOSIS — I130 Hypertensive heart and chronic kidney disease with heart failure and stage 1 through stage 4 chronic kidney disease, or unspecified chronic kidney disease: Secondary | ICD-10-CM | POA: Diagnosis not present

## 2019-08-01 DIAGNOSIS — Z79899 Other long term (current) drug therapy: Secondary | ICD-10-CM | POA: Insufficient documentation

## 2019-08-01 DIAGNOSIS — Z9861 Coronary angioplasty status: Secondary | ICD-10-CM

## 2019-08-01 DIAGNOSIS — I5022 Chronic systolic (congestive) heart failure: Secondary | ICD-10-CM

## 2019-08-01 DIAGNOSIS — I252 Old myocardial infarction: Secondary | ICD-10-CM | POA: Insufficient documentation

## 2019-08-01 DIAGNOSIS — Z7902 Long term (current) use of antithrombotics/antiplatelets: Secondary | ICD-10-CM | POA: Diagnosis not present

## 2019-08-01 DIAGNOSIS — M109 Gout, unspecified: Secondary | ICD-10-CM | POA: Diagnosis not present

## 2019-08-01 DIAGNOSIS — Z87442 Personal history of urinary calculi: Secondary | ICD-10-CM | POA: Diagnosis not present

## 2019-08-01 DIAGNOSIS — N184 Chronic kidney disease, stage 4 (severe): Secondary | ICD-10-CM | POA: Insufficient documentation

## 2019-08-01 DIAGNOSIS — Z7982 Long term (current) use of aspirin: Secondary | ICD-10-CM | POA: Insufficient documentation

## 2019-08-01 DIAGNOSIS — Z803 Family history of malignant neoplasm of breast: Secondary | ICD-10-CM | POA: Diagnosis not present

## 2019-08-01 DIAGNOSIS — I2510 Atherosclerotic heart disease of native coronary artery without angina pectoris: Secondary | ICD-10-CM | POA: Diagnosis not present

## 2019-08-01 DIAGNOSIS — Z8249 Family history of ischemic heart disease and other diseases of the circulatory system: Secondary | ICD-10-CM | POA: Insufficient documentation

## 2019-08-01 DIAGNOSIS — Z87891 Personal history of nicotine dependence: Secondary | ICD-10-CM | POA: Diagnosis not present

## 2019-08-01 DIAGNOSIS — M79671 Pain in right foot: Secondary | ICD-10-CM

## 2019-08-01 DIAGNOSIS — Z955 Presence of coronary angioplasty implant and graft: Secondary | ICD-10-CM | POA: Diagnosis not present

## 2019-08-01 DIAGNOSIS — Z833 Family history of diabetes mellitus: Secondary | ICD-10-CM | POA: Diagnosis not present

## 2019-08-01 DIAGNOSIS — M25571 Pain in right ankle and joints of right foot: Secondary | ICD-10-CM | POA: Insufficient documentation

## 2019-08-01 LAB — BASIC METABOLIC PANEL
Anion gap: 9 (ref 5–15)
BUN: 34 mg/dL — ABNORMAL HIGH (ref 8–23)
CO2: 22 mmol/L (ref 22–32)
Calcium: 10 mg/dL (ref 8.9–10.3)
Chloride: 112 mmol/L — ABNORMAL HIGH (ref 98–111)
Creatinine, Ser: 2.78 mg/dL — ABNORMAL HIGH (ref 0.61–1.24)
GFR calc Af Amer: 27 mL/min — ABNORMAL LOW (ref >60)
GFR calc non Af Amer: 23 mL/min — ABNORMAL LOW (ref >60)
Glucose, Bld: 88 mg/dL (ref 70–99)
Potassium: 4 mmol/L (ref 3.5–5.1)
Sodium: 143 mmol/L (ref 135–145)

## 2019-08-01 LAB — URIC ACID: Uric Acid, Serum: 8.7 mg/dL — ABNORMAL HIGH (ref 3.7–8.6)

## 2019-08-01 MED ORDER — PREDNISONE 20 MG PO TABS: 40.0000 mg | ORAL_TABLET | Freq: Every day | ORAL | 0 refills | Status: AC

## 2019-08-01 MED ORDER — IVABRADINE HCL 5 MG PO TABS: 5.0000 mg | ORAL_TABLET | Freq: Two times a day (BID) | ORAL | 11 refills | Status: DC

## 2019-08-01 NOTE — Progress Notes (Signed)
ADVANCED HF CLINIC PROGRESS NOTE  Primary Cardiologist: DR. Burt Knack Primary Care: Dr. Orson Ape AHF: Dr. Haroldine Laws   Reason for Visit: F/u for Chronic Systolic Heart Failure  HPI:  64 y/o male, former smoker with HTN, CKD IV, CAD and systolic HF. Experienced a large anterolateral STEMI on 03/12/19. Taken to cath lab.   Cath 03/12/2019  Total occlusion of the mid LAD treated with angioplasty and stenting using a 4.0 x 22 Onyx deployed at 14 atm. 0% stenosis was noted post procedure and TIMI grade III flow increased from 0 to 3. The final angiographic images of the LAD demonstrates apical occlusion due to embolus.  Luminal irregularities in the left main but no agreeable stenosis.  Large circumflex with 3 obtuse marginals. The first marginal is large and has a very distal 90% stenosis in 2 branches. There is a trifurcation distally in the first marginal.  RCA Moderate diffuse luminal irregularities but widely patent.  Mid anterior wall to apical akinesis. EF 30 to 35% acutely. LVEDP 31 mmHg.   Post cath had low BP and shock but recovered. EF 20% by echo. No significant MR. Creatinine post cath 3.1-3.4. Now followed by nephrology. Has been taking Lasix 40 mg MWF.    Had recent repeat echo 3/21. EF 20-25%. RV normal.   [facility] went to [facility] w/ CC of exertional dyspnea. No resting symptoms. No CP. ED provider note reviewed. Per their assessment, BNP was mildly elevated, vascular congestion on chest x-ray. Troponin was minimally elevated and flat upon second troponin.  It was noted that pt had taken a dose of PO Lasix prior to coming in to the ED and noted some improvement and was urinating well. Not felt to require admission. Was not given IV Lasix. Was released and advised to f/u in [facility]. He had post ED f/u here in [facility] on 3/10 and was still SOB w/ physical activity. No resting symptoms. Denied orthopnea/PND. Had mild bilateral LEE. Denied CP. Had only been taking lasix 3 days  a week. Reported normal UOP. Admited to dietary indiscretion w/ sodium recently. ReDs Clip measurement was elevated at 50%. We increased his diuretics. He was instructed to increase Lasix to 40 mg bid x 2 days and change to 40 mg daily thereafter.   Per chart review, he ended up going back to the [facility] ED on 3/22 w/ persistent symptoms. BNP was 597. SCr was 2.7 c/w baseline. He was given a dose of IV lasix w/ relief of symptoms. Did not require admission.   He presents back to clinic for f/u. Feels much better. Breathing improved. Denies resting symptoms. No chest pain. No LEE. Wt has been stable at home. No gain. Reports full med compliance. Taking lasix daily. UOP good. Urine clear. Has f/u with nephrology in April.  He has been trying to work on reducing sodium intake.   Only complaint today is rt ankle joint pain. Has h/o gout and pain is similar.     Past Medical History:  Diagnosis Date  . Acute ST elevation myocardial infarction (STEMI) due to occlusion of mid portion of left anterior descending (LAD) coronary artery ([place]) 03/12/2019  . Acute systolic heart failure ([place])   . CHF (congestive heart failure) ([place])   . CKD (chronic kidney disease) stage 4, GFR 15-29 ml/min (HCC)   . Heart attack ([place]) 03/15/2019  . Hypertension 2002  . Kidney stones   . Left knee pain   . Syncope 04/04/2019  Current Outpatient Medications  Medication Sig Dispense Refill  . allopurinol (ZYLOPRIM) 100 MG tablet Take 100 mg by mouth daily.     Marland Kitchen aspirin 81 MG chewable tablet Chew 1 tablet (81 mg total) by mouth daily. 90 tablet 1  . atorvastatin (LIPITOR) 80 MG tablet Take 1 tablet (80 mg total) by mouth daily at 6 PM. 90 tablet 1  . BIDIL 20-37.5 MG tablet Take 1 tablet by mouth twice daily (Patient taking differently: Take 1 tablet by mouth 2 (two) times daily. ) 60 tablet 1  . carvedilol (COREG) 12.5 MG tablet Take 12.5 mg by mouth 2 (two) times daily with a meal.    . furosemide (LASIX) 40 MG  tablet Take 1 tablet (40 mg total) by mouth daily. 35 tablet 5  . sodium bicarbonate 650 MG tablet Take 650 mg by mouth 3 (three) times daily.    . ticagrelor (BRILINTA) 90 MG TABS tablet Take 1 tablet (90 mg total) by mouth 2 (two) times daily. 180 tablet 1  . nitroGLYCERIN (NITROSTAT) 0.4 MG SL tablet Place 1 tablet (0.4 mg total) under the tongue every 5 (five) minutes as needed. (Patient not taking: Reported on 08/01/2019) 25 tablet 2   No current facility-administered medications for this encounter.    No Known Allergies    Social History   Socioeconomic History  . Marital status: Single    Spouse name: Not on file  . Number of children: 0  . Years of education: Not on file  . Highest education level: Some college, no degree  Occupational History  . Occupation: retired  Tobacco Use  . Smoking status: Former Smoker    Packs/day: 0.50    Years: 40.00    Pack years: 20.00    Types: Cigarettes    Quit date: 03/12/2019    Years since quitting: 0.3  . Smokeless tobacco: Never Used  Substance and Sexual Activity  . Alcohol use: No  . Drug use: No  . Sexual activity: Not Currently  Other Topics Concern  . Not on file  Social History Narrative   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       Enjoys: fishing, shopping, working around the house       Diet: working on changes, avoiding fried foods, increased veggies   Caffeine: teas some daily   Water: 6-8 cups daily       Wears seat belt    Does not use phone while driving    Oceanographer at home    Social Determinants of Health   Financial Resource Strain: Low Risk   . Difficulty of Paying Living Expenses: Not hard at all  Food Insecurity: No Food Insecurity  . Worried About Charity fundraiser in the Last Year: Never true  . Ran Out of Food in the Last Year: Never true  Transportation Needs: No Transportation Needs  . Lack of Transportation (Medical): No  . Lack of Transportation (Non-Medical): No  Physical  Activity: Sufficiently Active  . Days of Exercise per Week: 3 days  . Minutes of Exercise per Session: 60 min  Stress: No Stress Concern Present  . Feeling of Stress : Not at all  Social Connections: Somewhat Isolated  . Frequency of Communication with Friends and Family: More than three times a week  . Frequency of Social Gatherings with Friends and Family: More than three times a week  . Attends Religious Services: More than 4 times per year  . Active Member  of Clubs or Organizations: No  . Attends Archivist Meetings: Never  . Marital Status: Never married  Intimate Partner Violence: Not At Risk  . Fear of Current or Ex-Partner: No  . Emotionally Abused: No  . Physically Abused: No  . Sexually Abused: No      Family History  Problem Relation Age of Onset  . Heart failure Mother   . Heart disease Mother   . Cancer Mother        breast  . Heart attack Father   . Hypertension Father   . Heart disease Father   . Diabetes Brother     Vitals:   08/01/19 0905  BP: 128/86  Pulse: 96  SpO2: 94%  Weight: 90.9 kg (200 lb 6.4 oz)    PHYSICAL EXAM: PHYSICAL EXAM: General:  Well appearing. No respiratory difficulty HEENT: normal Neck: supple. no JVD. Carotids 2+ bilat; no bruits. No lymphadenopathy or thyromegaly appreciated. Cor: PMI nondisplaced. Regular rate & rhythm. No rubs, gallops or murmurs. Lungs: clear Abdomen: soft, nontender, nondistended. No hepatosplenomegaly. No bruits or masses. Good bowel sounds. Extremities: no cyanosis, clubbing, rash, edema Neuro: alert & oriented x 3, cranial nerves grossly intact. moves all 4 extremities w/o difficulty. Affect pleasant.   ECG: not performed   ASSESSMENT & PLAN:  1. CAD  - s/p acute anterolateral STEMI 03/12/19, treated w/ DES to LAD - EF 20-25% - no s/s of ischemia  - Continue DAPT + statin - Continue  blocker   2. Chronic Sytolic HF/ Ischemic Cardiomyopathy  - EF 20%. Following anteriorlateral  stemi - Repeat Echo 3/21: EF 20-25%. RV ok. EP consult to discuss ICD scheduled w/ Dr. Lovena Le 4/13 - Euvolemic on exam. NYHA Class II - Continue lasix 40 mg daily - Check BMP today  - Continue Bidil 1 tab TID  - Continue carvedilol 12.5 bid. Titration limited by fatigue - Add ivabradine 5 mg bid for elevated HR, 90s. Target <70 - Not candidate for Entresto, SGLT2i or spiro with CKD IV.  - Discussed importance of low sodium diet. Continue daily wts + fluid restriction    3. 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 Hinda Lenis - On daily Lasix. Check BMP today  - He has f/u with nephrology next month, 4/17  4. HTN - controlled on current regimen   5. Tobacco use - congratulated on cessation  6. Foot Pain - h/o gout. Pain c/w previous flares. On loop diuretic - check uric acid level   Keep EP consultation to discuss ICD 4/13. Keep scheduled f/u w/ Dr. Haroldine Laws in May  Shamarra Warda, PA-C  9:18 AM

## 2019-08-01 NOTE — Telephone Encounter (Signed)
Pt aware and voiced understanding    [name]  08/01/2019 1:35 PM EDT    Renal function and k stable.   Uric acid mildly elevated. Suspect acute gout flare in ankle. Treat w/ Prednisone 40 mg daily x 3 days. Please notify pt and send Rx to pharmacy.

## 2019-08-01 NOTE — Patient Instructions (Signed)
START Corlanor 5 mg, one tab twice daily  Labs today We will only contact you if something comes back abnormal or we need to make some changes. Otherwise no news is good news!  Keep follow up as scheduled with Dr Lovena Le (EP) and Dr Haroldine Laws (CHF)  Do the following things EVERYDAY: 1) Weigh yourself in the morning before breakfast. Write it down and keep it in a log. 2) Take your medicines as prescribed 3) Eat low salt foods--Limit salt (sodium) to 2000 mg per day.  4) Stay as active as you can everyday 5) Limit all fluids for the day to less than 2 liters  At the [facility], you and your health needs are our priority. As part of our continuing mission to provide you with exceptional heart care, we have created designated Provider Care Teams. These Care Teams include your primary Cardiologist (physician) and Advanced Practice Providers (APPs- Physician Assistants and Nurse Practitioners) who all work together to provide you with the care you need, when you need it.   You may see any of the following providers on your designated Care Team at your next follow up: Marland Kitchen Dr Glori Bickers . Dr Loralie Champagne . Darrick Grinder, NP . Lyda Jester, PA . Audry Riles, PharmD   Please be sure to bring in all your medications bottles to every appointment.

## 2019-08-01 NOTE — Telephone Encounter (Signed)
-----   Message from Consuelo Pandy, [place] sent at 08/01/2019  1:35 PM EDT ----- Renal function and k stable.   Uric acid mildly elevated. Suspect acute gout flare in ankle. Treat w/ Prednisone 40 mg daily x 3 days. Please notify pt and send Rx to pharmacy.

## 2019-08-02 ENCOUNTER — Encounter (HOSPITAL_COMMUNITY): Payer: Medicare Other

## 2019-08-04 ENCOUNTER — Encounter (HOSPITAL_COMMUNITY): Payer: Medicare Other

## 2019-08-07 ENCOUNTER — Other Ambulatory Visit: Payer: Self-pay

## 2019-08-07 ENCOUNTER — Encounter (HOSPITAL_COMMUNITY)
Admission: RE | Admit: 2019-08-07 | Discharge: 2019-08-07 | Disposition: A | Discharge status: Home / Self Care | Payer: Medicare Other | Source: Ambulatory Visit | Attending: Interventional Cardiology | Admitting: Interventional Cardiology

## 2019-08-07 DIAGNOSIS — Z955 Presence of coronary angioplasty implant and graft: Secondary | ICD-10-CM

## 2019-08-07 DIAGNOSIS — I213 ST elevation (STEMI) myocardial infarction of unspecified site: Secondary | ICD-10-CM | POA: Diagnosis present

## 2019-08-07 NOTE — Progress Notes (Signed)
Daily Session Note  Patient Details  Name: Manjinder Breau MRN: [id number] Date of Birth: 1955-05-19 Referring Provider:     CARDIAC REHAB [place] from 06/19/2019 in [place]  Referring Provider  [name]      Encounter Date: 08/07/2019  Check In: Session Check In - 08/07/19 0815      Check-In   Supervising physician immediately available to respond to emergencies  See telemetry face sheet for immediately available MD    Location  [facility]    Staff Present  Aundra Dubin, RN, BSN;Diane Coad, MS, EP, [facility], Exercise Physiologist    Virtual Visit  No    Medication changes reported      No    Fall or balance concerns reported     No    Tobacco Cessation  No Change    Warm-up and Cool-down  Performed as group-led instruction    Resistance Training Performed  Yes    VAD Patient?  No    PAD/SET Patient?  No      Pain Assessment   Currently in Pain?  No/denies    Pain Score  0-No pain    Multiple Pain Sites  No       Capillary Blood Glucose: No results found for this or any previous visit (from the past 24 hour(s)).    Social History   Tobacco Use  Smoking Status Former Smoker  . Packs/day: 0.50  . Years: 40.00  . Pack years: 20.00  . Types: Cigarettes  . Quit date: 03/12/2019  . Years since quitting: 0.4  Smokeless Tobacco Never Used    Goals Met:  Independence with exercise equipment Exercise tolerated well No report of cardiac concerns or symptoms Strength training completed today  Goals Unmet:  Not Applicable  Comments: Check out 915.   Dr. [name] is Medical Director for [facility] Cardiac and Pulmonary Rehab.

## 2019-08-09 ENCOUNTER — Encounter (HOSPITAL_COMMUNITY)
Admission: RE | Admit: 2019-08-09 | Discharge: 2019-08-09 | Disposition: A | Discharge status: Home / Self Care | Payer: Medicare Other | Source: Ambulatory Visit | Attending: Interventional Cardiology | Admitting: Interventional Cardiology

## 2019-08-09 ENCOUNTER — Other Ambulatory Visit: Payer: Self-pay

## 2019-08-09 DIAGNOSIS — Z955 Presence of coronary angioplasty implant and graft: Secondary | ICD-10-CM

## 2019-08-09 DIAGNOSIS — I213 ST elevation (STEMI) myocardial infarction of unspecified site: Secondary | ICD-10-CM | POA: Diagnosis not present

## 2019-08-09 NOTE — Progress Notes (Signed)
Daily Session Note  Patient Details  Name: [name] MRN: [id number] Date of Birth: 08-Jul-1955 Referring Provider:     CARDIAC REHAB PHASE II ORIENTATION from 06/19/2019 in [place]  Referring Provider  [name]      Encounter Date: 08/09/2019  Check In: Session Check In - 08/09/19 0830      Check-In   Supervising physician immediately available to respond to emergencies  See telemetry face sheet for immediately available MD    Location  [facility]    Staff Present  Russella Dar, MS, EP, [facility], Exercise Physiologist;Debra Wynetta Emery, RN, BSN;Other    Virtual Visit  No    Medication changes reported      No    Fall or balance concerns reported     No    Tobacco Cessation  No Change    Warm-up and Cool-down  Performed as group-led instruction    Resistance Training Performed  Yes    VAD Patient?  No    PAD/SET Patient?  No      Pain Assessment   Currently in Pain?  No/denies    Pain Score  0-No pain    Multiple Pain Sites  No       Capillary Blood Glucose: No results found for this or any previous visit (from the past 24 hour(s)).    Social History   Tobacco Use  Smoking Status Former Smoker  . Packs/day: 0.50  . Years: 40.00  . Pack years: 20.00  . Types: Cigarettes  . Quit date: 03/12/2019  . Years since quitting: 0.4  Smokeless Tobacco Never Used    Goals Met:  Independence with exercise equipment Exercise tolerated well Personal goals reviewed No report of cardiac concerns or symptoms Strength training completed today  Goals Unmet:  Not Applicable  Comments: Check out: 0915    Dr. [name] is Medical Director for [place] and Pulmonary Re[facility].

## 2019-08-11 ENCOUNTER — Encounter (HOSPITAL_COMMUNITY): Payer: Medicare Other

## 2019-08-14 ENCOUNTER — Other Ambulatory Visit: Payer: Self-pay

## 2019-08-14 ENCOUNTER — Encounter (HOSPITAL_COMMUNITY)
Admission: RE | Admit: 2019-08-14 | Discharge: 2019-08-14 | Disposition: A | Discharge status: Home / Self Care | Payer: Medicare Other | Source: Ambulatory Visit | Attending: Interventional Cardiology | Admitting: Interventional Cardiology

## 2019-08-14 DIAGNOSIS — I213 ST elevation (STEMI) myocardial infarction of unspecified site: Secondary | ICD-10-CM | POA: Diagnosis not present

## 2019-08-14 DIAGNOSIS — Z955 Presence of coronary angioplasty implant and graft: Secondary | ICD-10-CM

## 2019-08-14 NOTE — Progress Notes (Signed)
Daily Session Note  Patient Details  Name: Vash Quezada MRN: [id number] Date of Birth: 07/05/1955 Referring Provider:     CARDIAC REHAB PHASE II ORIENTATION from 06/19/2019 in [place]  Referring Provider  [name]      Encounter Date: 08/14/2019  Check In: Session Check In - 08/14/19 0815      Check-In   Supervising physician immediately available to respond to emergencies  See telemetry face sheet for immediately available MD    Location  [facility]    Staff Present  Russella Dar, MS, EP, [facility], Exercise Physiologist;Cael Worth Wynetta Emery, RN, BSN;Other    Virtual Visit  No    Medication changes reported      No    Fall or balance concerns reported     No    Tobacco Cessation  No Change    Warm-up and Cool-down  Performed as group-led instruction    Resistance Training Performed  Yes    VAD Patient?  No    PAD/SET Patient?  No      Pain Assessment   Currently in Pain?  No/denies    Pain Score  0-No pain    Multiple Pain Sites  No       Capillary Blood Glucose: No results found for this or any previous visit (from the past 24 hour(s)).    Social History   Tobacco Use  Smoking Status Former Smoker  . Packs/day: 0.50  . Years: 40.00  . Pack years: 20.00  . Types: Cigarettes  . Quit date: 03/12/2019  . Years since quitting: 0.4  Smokeless Tobacco Never Used    Goals Met:  Independence with exercise equipment Exercise tolerated well No report of cardiac concerns or symptoms Strength training completed today  Goals Unmet:  Not Applicable  Comments: Check out 915.   Dr. [name] is Medical Director for [facility] and [facility] Re[facility].

## 2019-08-15 ENCOUNTER — Encounter: Payer: Self-pay | Admitting: Internal Medicine

## 2019-08-15 ENCOUNTER — Other Ambulatory Visit: Payer: Self-pay

## 2019-08-15 ENCOUNTER — Ambulatory Visit (INDEPENDENT_AMBULATORY_CARE_PROVIDER_SITE_OTHER): Payer: Medicare Other | Admitting: Internal Medicine

## 2019-08-15 VITALS — BP 102/78 | HR 65 | Ht 72.0 in | Wt 198.6 lb

## 2019-08-15 DIAGNOSIS — I255 Ischemic cardiomyopathy: Secondary | ICD-10-CM | POA: Diagnosis not present

## 2019-08-15 DIAGNOSIS — N184 Chronic kidney disease, stage 4 (severe): Secondary | ICD-10-CM

## 2019-08-15 DIAGNOSIS — I5022 Chronic systolic (congestive) heart failure: Secondary | ICD-10-CM | POA: Diagnosis not present

## 2019-08-15 NOTE — H&P (View-Only) (Signed)
HPI [name] is referred today by Dr. Reine Just for consideration for ICD insertion. He is a pleasant man with a h/o CAD, s/p anterior MI in September 2020. He underwent delayed revascularization as he was initially taken to [facility] and then transferred to [facility]. He has been treated with guideline directed medical therapy. He has not had syncope. He had a repeat 2D echo a few weeks ago with severe LV dysfunction, EF 25%. He has class 2 CHF and denies chest pain.  No Known Allergies   Current Outpatient Medications  Medication Sig Dispense Refill   allopurinol (ZYLOPRIM) 100 MG tablet Take 100 mg by mouth daily.      aspirin 81 MG chewable tablet Chew 1 tablet (81 mg total) by mouth daily. 90 tablet 1   atorvastatin (LIPITOR) 80 MG tablet Take 1 tablet (80 mg total) by mouth daily at 6 PM. 90 tablet 1   BIDIL 20-37.5 MG tablet Take 1 tablet by mouth twice daily (Patient taking differently: Take 1 tablet by mouth in the morning and at bedtime. ) 60 tablet 1   carvedilol (COREG) 12.5 MG tablet Take 12.5 mg by mouth 2 (two) times daily with a meal.     ergocalciferol (VITAMIN D2) 1.25 MG (50000 UT) capsule Take 50,000 Units by mouth every Wednesday.      furosemide (LASIX) 40 MG tablet Take 1 tablet (40 mg total) by mouth daily. 35 tablet 5   ivabradine (CORLANOR) 5 MG TABS tablet Take 1 tablet (5 mg total) by mouth 2 (two) times daily with a meal. 60 tablet 11   nitroGLYCERIN (NITROSTAT) 0.4 MG SL tablet Place 1 tablet (0.4 mg total) under the tongue every 5 (five) minutes as needed. 25 tablet 2   sodium bicarbonate 650 MG tablet Take 650 mg by mouth 3 (three) times daily.     ticagrelor (BRILINTA) 90 MG TABS tablet Take 1 tablet (90 mg total) by mouth 2 (two) times daily. 180 tablet 1   No current facility-administered medications for this visit.     Past Medical History:  Diagnosis Date   Acute ST elevation myocardial infarction (STEMI) due to occlusion of mid portion of left anterior  descending (LAD) coronary artery ([place]) 40/01/8118   Acute systolic heart failure (HCC)    CHF (congestive heart failure) (HCC)    CKD (chronic kidney disease) stage 4, GFR 15-29 ml/min ([place])    Heart attack ([place]) 03/15/2019   Hypertension 2002   Kidney stones    Left knee pain    Syncope 04/04/2019    ROS:   All systems reviewed and negative except as noted in the HPI.   Past Surgical History:  Procedure Laterality Date   AMPUTATION TOE Left 1991   2 digit of left foot   CHOLECYSTECTOMY     CORONARY/GRAFT ACUTE MI REVASCULARIZATION N/A 03/12/2019   Procedure: Coronary/Graft Acute MI Revascularization;  Surgeon: Belva Crome, MD;  Location: [facility];  Service: Cardiovascular;  Laterality: N/A;   LEFT HEART CATH AND CORONARY ANGIOGRAPHY N/A 03/12/2019   Procedure: LEFT HEART CATH AND CORONARY ANGIOGRAPHY;  Surgeon: Belva Crome, MD;  Location: [facility];  Service: Cardiovascular;  Laterality: N/A;   LITHOTRIPSY Right      Family History  Problem Relation Age of Onset   Heart failure Mother    Heart disease Mother    Cancer Mother        breast   Heart attack Father  Hypertension Father    Heart disease Father    Diabetes Brother      Social History   Socioeconomic History   Marital status: Single    Spouse name: Not on file   Number of children: 0   Years of education: Not on file   Highest education level: Some college, no degree  Occupational History   Occupation: retired  Tobacco Use   Smoking status: Former Smoker    Packs/day: 0.50    Years: 40.00    Pack years: 20.00    Types: Cigarettes    Quit date: 03/12/2019    Years since quitting: 0.4   Smokeless tobacco: Never Used  Substance and Sexual Activity   Alcohol use: No   Drug use: No   Sexual activity: Not Currently  Other Topics Concern   Not on file  Social History Narrative   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       Enjoys: fishing, shopping, working around the  house       Diet: working on changes, avoiding fried foods, increased veggies   Caffeine: teas some daily   Water: 6-8 cups daily       Wears seat belt    Does not use phone while driving    Oceanographer at home    Social Determinants of Health   Financial Resource Strain: Low Risk    Difficulty of Paying Living Expenses: Not hard at all  Food Insecurity: No Food Insecurity   Worried About Charity fundraiser in the Last Year: Never true   Arboriculturist in the Last Year: Never true  Transportation Needs: No Transportation Needs   Lack of Transportation (Medical): No   Lack of Transportation (Non-Medical): No  Physical Activity: Sufficiently Active   Days of Exercise per Week: 3 days   Minutes of Exercise per Session: 60 min  Stress: No Stress Concern Present   Feeling of Stress : Not at all  Social Connections: Somewhat Isolated   Frequency of Communication with Friends and Family: More than three times a week   Frequency of Social Gatherings with Friends and Family: More than three times a week   Attends Religious Services: More than 4 times per year   Active Member of [employer] or Organizations: No   Attends Archivist Meetings: Never   Marital Status: Never married  Human resources officer Violence: Not At Risk   Fear of Current or Ex-Partner: No   Emotionally Abused: No   Physically Abused: No   Sexually Abused: No     BP 102/78   Pulse 65   Ht 6' (1.829 m)   Wt 198 lb 9.6 oz (90.1 kg)   SpO2 98%   BMI 26.94 kg/m   Physical Exam:  Well appearing middle aged man, NAD HEENT: Unremarkable Neck:  7 cm JVD, no thyromegally Lymphatics:  No adenopathy Back:  No CVA tenderness Lungs:  Clear with no wheezes HEART:  Regular rate rhythm, no murmurs, no rubs, no clicks Abd:  soft, positive bowel sounds, no organomegally, no rebound, no guarding Ext:  2 plus pulses, no edema, no cyanosis, no clubbing Skin:  No rashes no nodules  Neuro:  CN II through XII  intact, motor grossly intact  EKG - reviewed. NSR with anterior MI   Assess/Plan: Chronic systolic heart failure - his symptoms are class 2. He is on guideline directed medical therapy. He is not on an ACE/ARB due to chronic renal  failure. ICM - he is s/p anterior MI. He denies anginal symptoms. He will continue his current meds. I discussed the indications/risks/benefits/goals/expectations of ICD insertion and he would like to proceed.   Mikle Bosworth.D.

## 2019-08-15 NOTE — Progress Notes (Signed)
HPI [name] is referred today by Dr. Reine Just for consideration for ICD insertion. He is a pleasant man with a h/o CAD, s/p anterior MI in September 2020. He underwent delayed revascularization as he was initially taken to [facility] and then transferred to [facility]. He has been treated with guideline directed medical therapy. He has not had syncope. He had a repeat 2D echo a few weeks ago with severe LV dysfunction, EF 25%. He has class 2 CHF and denies chest pain.  No Known Allergies   Current Outpatient Medications  Medication Sig Dispense Refill   allopurinol (ZYLOPRIM) 100 MG tablet Take 100 mg by mouth daily.      aspirin 81 MG chewable tablet Chew 1 tablet (81 mg total) by mouth daily. 90 tablet 1   atorvastatin (LIPITOR) 80 MG tablet Take 1 tablet (80 mg total) by mouth daily at 6 PM. 90 tablet 1   BIDIL 20-37.5 MG tablet Take 1 tablet by mouth twice daily (Patient taking differently: Take 1 tablet by mouth in the morning and at bedtime. ) 60 tablet 1   carvedilol (COREG) 12.5 MG tablet Take 12.5 mg by mouth 2 (two) times daily with a meal.     ergocalciferol (VITAMIN D2) 1.25 MG (50000 UT) capsule Take 50,000 Units by mouth every Wednesday.      furosemide (LASIX) 40 MG tablet Take 1 tablet (40 mg total) by mouth daily. 35 tablet 5   ivabradine (CORLANOR) 5 MG TABS tablet Take 1 tablet (5 mg total) by mouth 2 (two) times daily with a meal. 60 tablet 11   nitroGLYCERIN (NITROSTAT) 0.4 MG SL tablet Place 1 tablet (0.4 mg total) under the tongue every 5 (five) minutes as needed. 25 tablet 2   sodium bicarbonate 650 MG tablet Take 650 mg by mouth 3 (three) times daily.     ticagrelor (BRILINTA) 90 MG TABS tablet Take 1 tablet (90 mg total) by mouth 2 (two) times daily. 180 tablet 1   No current facility-administered medications for this visit.     Past Medical History:  Diagnosis Date   Acute ST elevation myocardial infarction (STEMI) due to occlusion of mid portion of left anterior  descending (LAD) coronary artery ([place]) 32/08/4008   Acute systolic heart failure (HCC)    CHF (congestive heart failure) (HCC)    CKD (chronic kidney disease) stage 4, GFR 15-29 ml/min ([place])    Heart attack ([place]) 03/15/2019   Hypertension 2002   Kidney stones    Left knee pain    Syncope 04/04/2019    ROS:   All systems reviewed and negative except as noted in the HPI.   Past Surgical History:  Procedure Laterality Date   AMPUTATION TOE Left 1991   2 digit of left foot   CHOLECYSTECTOMY     CORONARY/GRAFT ACUTE MI REVASCULARIZATION N/A 03/12/2019   Procedure: Coronary/Graft Acute MI Revascularization;  Surgeon: Belva Crome, MD;  Location: [facility];  Service: Cardiovascular;  Laterality: N/A;   LEFT HEART CATH AND CORONARY ANGIOGRAPHY N/A 03/12/2019   Procedure: LEFT HEART CATH AND CORONARY ANGIOGRAPHY;  Surgeon: Belva Crome, MD;  Location: [facility] [facility];  Service: Cardiovascular;  Laterality: N/A;   LITHOTRIPSY Right      Family History  Problem Relation Age of Onset   Heart failure Mother    Heart disease Mother    Cancer Mother        breast   Heart attack Father  Hypertension Father    Heart disease Father    Diabetes Brother      Social History   Socioeconomic History   Marital status: Single    Spouse name: Not on file   Number of children: 0   Years of education: Not on file   Highest education level: Some college, no degree  Occupational History   Occupation: retired  Tobacco Use   Smoking status: Former Smoker    Packs/day: 0.50    Years: 40.00    Pack years: 20.00    Types: Cigarettes    Quit date: 03/12/2019    Years since quitting: 0.4   Smokeless tobacco: Never Used  Substance and Sexual Activity   Alcohol use: No   Drug use: No   Sexual activity: Not Currently  Other Topics Concern   Not on file  Social History Narrative   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       Enjoys: fishing, shopping, working around the  house       Diet: working on changes, avoiding fried foods, increased veggies   Caffeine: teas some daily   Water: 6-8 cups daily       Wears seat belt    Does not use phone while driving    Oceanographer at home    Social Determinants of Health   Financial Resource Strain: Low Risk    Difficulty of Paying Living Expenses: Not hard at all  Food Insecurity: No Food Insecurity   Worried About Charity fundraiser in the Last Year: Never true   Arboriculturist in the Last Year: Never true  Transportation Needs: No Transportation Needs   Lack of Transportation (Medical): No   Lack of Transportation (Non-Medical): No  Physical Activity: Sufficiently Active   Days of Exercise per Week: 3 days   Minutes of Exercise per Session: 60 min  Stress: No Stress Concern Present   Feeling of Stress : Not at all  Social Connections: Somewhat Isolated   Frequency of Communication with Friends and Family: More than three times a week   Frequency of Social Gatherings with Friends and Family: More than three times a week   Attends Religious Services: More than 4 times per year   Active Member of [employer] or Organizations: No   Attends Archivist Meetings: Never   Marital Status: Never married  Human resources officer Violence: Not At Risk   Fear of Current or Ex-Partner: No   Emotionally Abused: No   Physically Abused: No   Sexually Abused: No     BP 102/78   Pulse 65   Ht 6' (1.829 m)   Wt 198 lb 9.6 oz (90.1 kg)   SpO2 98%   BMI 26.94 kg/m   Physical Exam:  Well appearing middle aged man, NAD HEENT: Unremarkable Neck:  7 cm JVD, no thyromegally Lymphatics:  No adenopathy Back:  No CVA tenderness Lungs:  Clear with no wheezes HEART:  Regular rate rhythm, no murmurs, no rubs, no clicks Abd:  soft, positive bowel sounds, no organomegally, no rebound, no guarding Ext:  2 plus pulses, no edema, no cyanosis, no clubbing Skin:  No rashes no nodules  Neuro:  CN II through XII  intact, motor grossly intact  EKG - reviewed. NSR with anterior MI   Assess/Plan: Chronic systolic heart failure - his symptoms are class 2. He is on guideline directed medical therapy. He is not on an ACE/ARB due to chronic renal  failure. ICM - he is s/p anterior MI. He denies anginal symptoms. He will continue his current meds. I discussed the indications/risks/benefits/goals/expectations of ICD insertion and he would like to proceed.   Mikle Bosworth.D.

## 2019-08-15 NOTE — Patient Instructions (Addendum)
Medication Instructions:  Your physician recommends that you continue on your current medications as directed. Please refer to the Current Medication list given to you today.  Labwork: None ordered.  Testing/Procedures: None ordered.  Follow-Up:  SEE INSTRUCTION LETTER  Any Other Special Instructions Will Be Listed Below (If Applicable).  If you need a refill on your cardiac medications before your next appointment, please call your pharmacy.    Cardioverter Defibrillator Implantation  An implantable cardioverter defibrillator (ICD) is a small device that is placed under the skin in the chest or abdomen. An ICD consists of a battery, a small computer (pulse generator), and wires (leads) that go into the heart. An ICD is used to detect and correct two types of dangerous irregular heartbeats (arrhythmias):  A rapid heart rhythm (tachycardia).  An arrhythmia in which the lower chambers of the heart (ventricles) contract in an uncoordinated way (fibrillation). When an ICD detects tachycardia, it sends a low-energy shock to the heart to restore the heartbeat to normal (cardioversion). This signal is usually painless. If cardioversion does not work or if the ICD detects fibrillation, it delivers a high-energy shock to the heart (defibrillation) to restart the heart. This shock may feel like a strong jolt in the chest. Your health care provider may prescribe an ICD if:  You have had an arrhythmia that originated in the ventricles.  Your heart has been damaged by a disease or heart condition. Sometimes, ICDs are programmed to act as a device called a pacemaker. Pacemakers can be used to treat a slow heartbeat (bradycardia) or tachycardia by taking over the heart rate with electrical impulses. Tell a health care provider about:  Any allergies you have.  All medicines you are taking, including vitamins, herbs, eye drops, creams, and over-the-counter medicines.  Any problems you or family  members have had with anesthetic medicines.  Any blood disorders you have.  Any surgeries you have had.  Any medical conditions you have.  Whether you are pregnant or may be pregnant. What are the risks? Generally, this is a safe procedure. However, problems may occur, including:  Swelling, bleeding, or bruising.  Infection.  Blood clots.  Damage to other structures or organs, such as nerves, blood vessels, or the heart.  Allergic reactions to medicines used during the procedure. What happens before the procedure? Staying hydrated Follow instructions from your health care provider about hydration, which may include:  Up to 2 hours before the procedure - you may continue to drink clear liquids, such as water, clear fruit juice, black coffee, and plain tea. Eating and drinking restrictions Follow instructions from your health care provider about eating and drinking, which may include:  8 hours before the procedure - stop eating heavy meals or foods such as meat, fried foods, or fatty foods.  6 hours before the procedure - stop eating light meals or foods, such as toast or cereal.  6 hours before the procedure - stop drinking milk or drinks that contain milk.  2 hours before the procedure - stop drinking clear liquids. Medicine Ask your health care provider about:  Changing or stopping your normal medicines. This is important if you take diabetes medicines or blood thinners.  Taking medicines such as aspirin and ibuprofen. These medicines can thin your blood. Do not take these medicines before your procedure if your doctor tells you not to. Tests  You may have blood tests.  You may have a test to check the electrical signals in your heart (electrocardiogram, ECG).    You may have imaging tests, such as a chest X-ray. General instructions  For 24 hours before the procedure, stop using products that contain nicotine or tobacco, such as cigarettes and e-cigarettes. If you  need help quitting, ask your health care provider.  Plan to have someone take you home from the hospital or clinic.  You may be asked to shower with a germ-killing soap. What happens during the procedure?  To reduce your risk of infection: ? Your health care team will wash or sanitize their hands. ? Your skin will be washed with soap. ? Hair may be removed from the surgical area.  Small monitors will be put on your body. They will be used to check your heart, blood pressure, and oxygen level.  An IV tube will be inserted into one of your veins.  You will be given one or more of the following: ? A medicine to help you relax (sedative). ? A medicine to numb the area (local anesthetic). ? A medicine to make you fall asleep (general anesthetic).  Leads will be guided through a blood vessel into your heart and attached to your heart muscles. Depending on the ICD, the leads may go into one ventricle or they may go into both ventricles and into an upper chamber of the heart. An X-ray machine (fluoroscope) will be usedto help guide the leads.  A small incision will be made to create a deep pocket under your skin.  The pulse generator will be placed into the pocket.  The ICD will be tested.  The incision will be closed with stitches (sutures), skin glue, or staples.  A bandage (dressing) will be placed over the incision. This procedure may vary among health care providers and hospitals. What happens after the procedure?  Your blood pressure, heart rate, breathing rate, and blood oxygen level will be monitored often until the medicines you were given have worn off.  A chest X-ray will be taken to check that the ICD is in the right place.  You will need to stay in the hospital for 1-2 days so your health care provider can make sure your ICD is working.  Do not drive for 24 hours if you received a sedative. Ask your health care provider when it is safe for you to drive.  You may be  given an identification card explaining that you have an ICD. Summary  An implantable cardioverter defibrillator (ICD) is a small device that is placed under the skin in the chest or abdomen. It is used to detect and correct dangerous irregular heartbeats (arrhythmias).  An ICD consists of a battery, a small computer (pulse generator), and wires (leads) that go into the heart.  When an ICD detects rapid heart rhythm (tachycardia), it sends a low-energy shock to the heart to restore the heartbeat to normal (cardioversion). If cardioversion does not work or if the ICD detects uncoordinated heart contractions (fibrillation), it delivers a high-energy shock to the heart (defibrillation) to restart the heart.  You will need to stay in the hospital for 1-2 days to make sure your ICD is working. This information is not intended to replace advice given to you by your health care provider. Make sure you discuss any questions you have with your health care provider. Document Revised: 04/02/2017 Document Reviewed: 04/29/2016 Elsevier Patient Education  2020 [employer]  

## 2019-08-16 ENCOUNTER — Other Ambulatory Visit (HOSPITAL_COMMUNITY): Payer: Self-pay | Admitting: Nephrology

## 2019-08-16 ENCOUNTER — Encounter (HOSPITAL_COMMUNITY)
Admission: RE | Admit: 2019-08-16 | Discharge: 2019-08-16 | Disposition: A | Discharge status: Home / Self Care | Payer: Medicare Other | Source: Ambulatory Visit | Attending: Interventional Cardiology | Admitting: Interventional Cardiology

## 2019-08-16 DIAGNOSIS — E211 Secondary hyperparathyroidism, not elsewhere classified: Secondary | ICD-10-CM

## 2019-08-16 DIAGNOSIS — I213 ST elevation (STEMI) myocardial infarction of unspecified site: Secondary | ICD-10-CM

## 2019-08-16 DIAGNOSIS — Z955 Presence of coronary angioplasty implant and graft: Secondary | ICD-10-CM

## 2019-08-16 NOTE — Progress Notes (Signed)
Daily Session Note  Patient Details  Name: [name] MRN: [id number] Date of Birth: 07-Dec-1955 Referring Provider:     CARDIAC REHAB PHASE II ORIENTATION from 06/19/2019 in [place]  Referring Provider  [name]      Encounter Date: 08/16/2019  Check In: Session Check In - 08/16/19 0815      Check-In   Supervising physician immediately available to respond to emergencies  See telemetry face sheet for immediately available MD    Location  [facility]    Staff Present  Russella Dar, MS, EP, [facility], Exercise Physiologist;Debra Wynetta Emery, RN, BSN    Virtual Visit  No    Medication changes reported      No    Fall or balance concerns reported     No    Tobacco Cessation  No Change    Warm-up and Cool-down  Performed as group-led instruction    Resistance Training Performed  Yes    VAD Patient?  No    PAD/SET Patient?  No      Pain Assessment   Currently in Pain?  No/denies    Pain Score  0-No pain    Multiple Pain Sites  No       Capillary Blood Glucose: No results found for this or any previous visit (from the past 24 hour(s)).    Social History   Tobacco Use  Smoking Status Former Smoker  . Packs/day: 0.50  . Years: 40.00  . Pack years: 20.00  . Types: Cigarettes  . Quit date: 03/12/2019  . Years since quitting: 0.4  Smokeless Tobacco Never Used    Goals Met:  Independence with exercise equipment Exercise tolerated well Personal goals reviewed No report of cardiac concerns or symptoms Strength training completed today  Goals Unmet:  Not Applicable  Comments: Check out: 0915   Dr. [name] is Medical Director for [facility].

## 2019-08-17 NOTE — Progress Notes (Signed)
Subjective: 1. Bilateral nephrolithiasis   2. Hydronephrosis with renal and ureteral calculus obstruction   3. Renal atrophy, left   4. CKD (chronic kidney disease) stage 4, GFR 15-29 ml/min (HCC)   5. Hypercalcemia   6. Renal cyst     [name] is a 64yo AAM who is sent in consultation by Dr. Dicky Doe for bilateral nephrolithiasis found on a recent renal US for evaluation of CKD 4.  There is a 1.6cm RLP stone and a probable staghorn stone on the left with moderate hydronephrosis.  He had a repeat CT prior to this visit to better assess the stone burden and the findings were confirmed with left upper pole obstruction.   There was a 1.6cm LLP partial staghorn stone and an 54mm LMP stone and 1.2cm LUP stone with upper pole obstruction and atrophy.  Cysts were present bilateral on both the CT and renal US.   His recent Cr is 2.78 which is stable over the past 3 months but down from the high.  His Calcium is 10.7 and he says he is being evaluated for hyperparathyroidism.   He has no pain.  He has no hematuria. He has had prior stones and had a lithotripsy several years ago.  He is currently on Brilinta for a coronary stent that was placed in November and is going to be off of it from placement of a defibrillator next week.  He will continue an 81mg  ASA.  .   ROS:  Review of Systems  All other systems reviewed and are negative.  IPSS    Row Name 08/18/19 0900         International Prostate Symptom Score   How often have you had the sensation of not emptying your bladder?  Not at All     How often have you had to urinate less than every two hours?  Not at All     How often have you found you stopped and started again several times when you urinated?  Not at All     How often have you found it difficult to postpone urination?  Not at All     How often have you had a weak urinary stream?  Not at All     How often have you had to strain to start urination?  Not at All     How many  times did you typically get up at night to urinate?  None     Total IPSS Score  0       Quality of Life due to urinary symptoms   If you were to spend the rest of your life with your urinary condition just the way it is now how would you feel about that?  Delighted        No Known Allergies  Past Medical History:  Diagnosis Date  . Acute ST elevation myocardial infarction (STEMI) due to occlusion of mid portion of left anterior descending (LAD) coronary artery ([place]) 03/12/2019  . Acute systolic heart failure ([place])   . CHF (congestive heart failure) ([place])   . CKD (chronic kidney disease) stage 4, GFR 15-29 ml/min (HCC)   . Heart attack ([place]) 03/15/2019  . Hypertension 2002  . Kidney stones   . Left knee pain   . Syncope 04/04/2019    Past Surgical History:  Procedure Laterality Date  . AMPUTATION TOE Left 1991   2 digit of left foot  . CHOLECYSTECTOMY    . CORONARY/GRAFT ACUTE MI  REVASCULARIZATION N/A 03/12/2019   Procedure: Coronary/Graft Acute MI Revascularization;  Surgeon: Belva Crome, MD;  Location: [facility];  Service: Cardiovascular;  Laterality: N/A;  . LEFT HEART CATH AND CORONARY ANGIOGRAPHY N/A 03/12/2019   Procedure: LEFT HEART CATH AND CORONARY ANGIOGRAPHY;  Surgeon: Belva Crome, MD;  Location: [facility];  Service: Cardiovascular;  Laterality: N/A;  . LITHOTRIPSY Right     Social History   Socioeconomic History  . Marital status: Single    Spouse name: Not on file  . Number of children: 0  . Years of education: Not on file  . Highest education level: Some college, no degree  Occupational History  . Occupation: retired  Tobacco Use  . Smoking status: Former Smoker    Packs/day: 0.50    Years: 40.00    Pack years: 20.00    Types: Cigarettes    Quit date: 03/12/2019    Years since quitting: 0.4  . Smokeless tobacco: Never Used  Substance and Sexual Activity  . Alcohol use: No  . Drug use: No  . Sexual activity: Not Currently  Other  Topics Concern  . Not on file  Social History Narrative   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       Enjoys: fishing, shopping, working around the house       Diet: working on changes, avoiding fried foods, increased veggies   Caffeine: teas some daily   Water: 6-8 cups daily       Wears seat belt    Does not use phone while driving    Oceanographer at home    Social Determinants of Health   Financial Resource Strain: Low Risk   . Difficulty of Paying Living Expenses: Not hard at all  Food Insecurity: No Food Insecurity  . Worried About Charity fundraiser in the Last Year: Never true  . Ran Out of Food in the Last Year: Never true  Transportation Needs: No Transportation Needs  . Lack of Transportation (Medical): No  . Lack of Transportation (Non-Medical): No  Physical Activity: Sufficiently Active  . Days of Exercise per Week: 3 days  . Minutes of Exercise per Session: 60 min  Stress: No Stress Concern Present  . Feeling of Stress : Not at all  Social Connections: Somewhat Isolated  . Frequency of Communication with Friends and Family: More than three times a week  . Frequency of Social Gatherings with Friends and Family: More than three times a week  . Attends Religious Services: More than 4 times per year  . Active Member of Clubs or Organizations: No  . Attends Archivist Meetings: Never  . Marital Status: Never married  Intimate Partner Violence: Not At Risk  . Fear of Current or Ex-Partner: No  . Emotionally Abused: No  . Physically Abused: No  . Sexually Abused: No    Family History  Problem Relation Age of Onset  . Heart failure Mother   . Heart disease Mother   . Cancer Mother        breast  . Heart attack Father   . Hypertension Father   . Heart disease Father   . Diabetes Brother     Anti-infectives: Anti-infectives (From admission, onward)   None      Current Outpatient Medications  Medication Sig Dispense Refill  .  allopurinol (ZYLOPRIM) 100 MG tablet Take 100 mg by mouth daily.     Marland Kitchen aspirin 81 MG chewable  tablet Chew 1 tablet (81 mg total) by mouth daily. 90 tablet 1  . atorvastatin (LIPITOR) 80 MG tablet Take 1 tablet (80 mg total) by mouth daily at 6 PM. 90 tablet 1  . BIDIL 20-37.5 MG tablet Take 1 tablet by mouth twice daily (Patient taking differently: Take 1 tablet by mouth in the morning and at bedtime. ) 60 tablet 1  . carvedilol (COREG) 12.5 MG tablet Take 12.5 mg by mouth 2 (two) times daily with a meal.    . ergocalciferol (VITAMIN D2) 1.25 MG (50000 UT) capsule Take 50,000 Units by mouth every Wednesday.     . furosemide (LASIX) 40 MG tablet Take 1 tablet (40 mg total) by mouth daily. 35 tablet 5  . ivabradine (CORLANOR) 5 MG TABS tablet Take 1 tablet (5 mg total) by mouth 2 (two) times daily with a meal. 60 tablet 11  . nitroGLYCERIN (NITROSTAT) 0.4 MG SL tablet Place 1 tablet (0.4 mg total) under the tongue every 5 (five) minutes as needed. 25 tablet 2  . sodium bicarbonate 650 MG tablet Take 650 mg by mouth 3 (three) times daily.    . ticagrelor (BRILINTA) 90 MG TABS tablet Take 1 tablet (90 mg total) by mouth 2 (two) times daily. 180 tablet 1   No current facility-administered medications for this visit.     Objective:  BP 109/75   Pulse 67   Ht 6' (1.829 m)   Wt 198 lb (89.8 kg)   BMI 26.85 kg/m    Physical Exam Vitals reviewed.  Constitutional:      Appearance: Normal appearance.  Neurological:     Mental Status: He is alert.     Lab Results:  Results for orders placed or performed in visit on 08/18/19 (from the past 24 hour(s))  POCT urinalysis dipstick     Status: Abnormal   Collection Time: 08/18/19  9:31 AM  Result Value Ref Range   Color, UA lt yellow    Clarity, UA clear    Glucose, UA Negative Negative   Bilirubin, UA neg    Ketones, UA neg    Spec Grav, UA 1.015 1.010 - 1.025   Blood, UA moderate    pH, UA 5.0 5.0 - 8.0   Protein, UA Negative Negative    Urobilinogen, UA negative (A) 0.2 or 1.0 E.U./dL   Nitrite, UA neg    Leukocytes, UA Trace (A) Negative   Appearance     Odor       BMET His Cr is 2.78 and calcium is 10.7.   Studies/Results: CLINICAL DATA:  64 year old male with history of hematuria.  EXAM: CT ABDOMEN AND PELVIS WITHOUT CONTRAST  TECHNIQUE: Multidetector CT imaging of the abdomen and pelvis was performed following the standard protocol without IV contrast.  COMPARISON:  CT the abdomen and pelvis 04/08/2011.  FINDINGS: Lower chest: Patchy areas of ground-glass attenuation noted throughout the lung bases bilaterally.  Hepatobiliary: No suspicious cystic or solid hepatic lesions are confidently identified on today's noncontrast CT examination. Status post cholecystectomy.  Pancreas: No definite pancreatic mass or peripancreatic fluid collections or inflammatory changes identified on today's noncontrast CT examination.  Spleen: Unremarkable.  Adrenals/Urinary Tract: Numerous nonobstructive calculi are noted within the collecting systems of the kidneys bilaterally, several of which have a staghorn configuration, measuring up to 1.9 x 1.4 cm in the lower pole collecting system of the left kidney. Additional prominent calculus in the upper pole collecting system of the left kidney measuring 1.7 x 1.8  cm which is associated with some upper pole caliectasis. No additional calculi are noted along the course of either ureter or within the lumen of the urinary bladder. No hydroureteronephrosis. Multiple low-attenuation renal lesions bilaterally, incompletely characterized on today's non-contrast CT examination, but statistically likely to represent cysts, largest of which is in the lower pole of the left kidney measuring 5.7 cm in diameter. Unenhanced appearance of the urinary bladder is normal. Bilateral adrenal glands are normal in appearance.  Stomach/Bowel: Unenhanced appearance of the stomach  is normal. No pathologic dilatation of small bowel or colon. Normal appendix.  Vascular/Lymphatic: Aortic atherosclerosis, without evidence of aneurysm in the abdominal or pelvic vasculature. No lymphadenopathy noted in the abdomen or pelvis.  Reproductive: Prostate gland and seminal vesicles are unremarkable in appearance.  Other: No significant volume of ascites.  No pneumoperitoneum.  Musculoskeletal: There are no aggressive appearing lytic or blastic lesions noted in the visualized portions of the skeleton.  IMPRESSION: 1. Numerous nonobstructive calculi in the collecting systems of both kidneys, several of which are staghorn in appearance, largest of which is in the lower pole of the left kidney measuring 1.9 x 1.4 cm. In addition, there is probable upper pole caliectasis in the left kidney likely related to obstruction from an upper pole calculus measuring 1.7 x 1.8 cm. 2. No ureteral stones or findings of urinary tract obstruction are noted at this time. 3. Aortic atherosclerosis.   Electronically Signed   By: Vinnie Langton M.D.   On: 07/11/2019 10:17   Assessment/Plan: Bilateral nephrolithiasis with a partial staghorn on the left and moderate left upper pole hydronephrosis with atrophy of the upper pole.  He needs a  left PCNL when he is able to stop Brilinta for the procedure.   I will check with cardiology on this.  I have reviewed the risks of the procedure and gave him a handout that outlines those risks.        CKD4 which is stable over the last few months and could be aggravated by the obstruction of the upper pole but I don't think I could reliably get a stent in to the upper pole and he will need a perc for this.    Renal cysts.  The cysts are bilateral but appear benign.    No orders of the defined types were placed in this encounter.    Orders Placed This Encounter  Procedures  . POCT urinalysis dipstick     Return in about 6 weeks (around  09/29/2019) for To discuss surgery. .    CC: Dr. Benny Lennert, Dr. Dicky Doe and Dr. Glori Bickers.      Irine Seal 08/18/2019 [phone]

## 2019-08-18 ENCOUNTER — Encounter: Payer: Self-pay | Admitting: Urology

## 2019-08-18 ENCOUNTER — Ambulatory Visit (INDEPENDENT_AMBULATORY_CARE_PROVIDER_SITE_OTHER): Payer: Medicare Other | Admitting: Urology

## 2019-08-18 ENCOUNTER — Other Ambulatory Visit: Payer: Self-pay

## 2019-08-18 ENCOUNTER — Encounter (HOSPITAL_COMMUNITY)
Admission: RE | Admit: 2019-08-18 | Discharge: 2019-08-18 | Disposition: A | Discharge status: Home / Self Care | Payer: Medicare Other | Source: Ambulatory Visit | Attending: Interventional Cardiology | Admitting: Interventional Cardiology

## 2019-08-18 VITALS — BP 109/75 | HR 67 | Ht 72.0 in | Wt 198.0 lb

## 2019-08-18 DIAGNOSIS — N281 Cyst of kidney, acquired: Secondary | ICD-10-CM | POA: Diagnosis not present

## 2019-08-18 DIAGNOSIS — Z955 Presence of coronary angioplasty implant and graft: Secondary | ICD-10-CM

## 2019-08-18 DIAGNOSIS — N184 Chronic kidney disease, stage 4 (severe): Secondary | ICD-10-CM

## 2019-08-18 DIAGNOSIS — N132 Hydronephrosis with renal and ureteral calculous obstruction: Secondary | ICD-10-CM | POA: Diagnosis not present

## 2019-08-18 DIAGNOSIS — N200 Calculus of kidney: Secondary | ICD-10-CM | POA: Diagnosis not present

## 2019-08-18 DIAGNOSIS — N261 Atrophy of kidney (terminal): Secondary | ICD-10-CM | POA: Diagnosis not present

## 2019-08-18 DIAGNOSIS — I213 ST elevation (STEMI) myocardial infarction of unspecified site: Secondary | ICD-10-CM

## 2019-08-18 DIAGNOSIS — I255 Ischemic cardiomyopathy: Secondary | ICD-10-CM | POA: Diagnosis not present

## 2019-08-18 LAB — POCT URINALYSIS DIPSTICK
Bilirubin, UA: NEGATIVE
Glucose, UA: NEGATIVE
Ketones, UA: NEGATIVE
Nitrite, UA: NEGATIVE
Protein, UA: NEGATIVE
Spec Grav, UA: 1.015 (ref 1.010–1.025)
Urobilinogen, UA: NEGATIVE E.U./dL — AB
pH, UA: 5 (ref 5.0–8.0)

## 2019-08-18 NOTE — Patient Instructions (Signed)
Percutaneous Nephrolithotomy Percutaneous nephrolithotomy is a procedure to remove kidney stones. Kidney stones are deposits that form inside your kidneys and can cause pain. You may need this procedure if:  You have large kidney stones. Kidney stones that are bigger than 2 cm (0.78 in.) wide may require this procedure.  Your kidney stones are oddly shaped.  Other treatments have not been successful in helping the kidney stones to pass.  You have developed an infection due to the kidney stones. Tell a health care provider about:  Any allergies you have.  All medicines you are taking, including vitamins, herbs, eye drops, creams, and over-the-counter medicines.  Any problems you or family members have had with anesthetic medicines.  Any blood disorders you have.  Any surgeries you have had.  Any medical conditions you have.  Whether you are pregnant or may be pregnant.  Whether you use any tobacco products, including cigarettes, chewing tobacco, or e-cigarettes. What are the risks? Generally, this is a safe procedure. However, problems may occur, including:  Infection.  Bleeding. This may include blood in your urine.  Allergic reactions to medicines.  Damage to other structures or organs.  Kidney damage.  Holes in the kidney. These often heal on their own.  Numbness or tingling in the affected area.  Inability to remove all the stones. You may need a different procedure to complete treatment. What happens before the procedure? Staying hydrated Follow instructions from your health care provider about hydration, which may include:  Up to 2 hours before the procedure - you may continue to drink clear liquids, such as water, clear fruit juice, black coffee, and plain tea.  Eating and drinking restrictions Follow instructions from your health care provider about eating and drinking, which may include:  8 hours before the procedure - stop eating heavy meals or foods,  such as meat, fried foods, or fatty foods.  6 hours before the procedure - stop eating light meals or foods, such as toast or cereal.  6 hours before the procedure - stop drinking milk or drinks that contain milk.  2 hours before the procedure - stop drinking clear liquids. Medicines Ask your health care provider about:  Changing or stopping your regular medicines. This is especially important if you are taking diabetes medicines or blood thinners.  Taking medicines such as aspirin and ibuprofen. These medicines can thin your blood. Do not take these medicines unless your health care provider tells you to take them.  Taking over-the-counter medicines, vitamins, herbs, and supplements. Tests You may have tests, including:  Blood tests.  Urine tests.  Tests to check how your heart is working.  Imaging studies. These are used to identify: ? The size and number (stone burden) of the kidney stones. ? The position of the kidney stones. General instructions  Plan to have someone take you home from the hospital or clinic.  Plan to have a responsible adult care for you for at least 24 hours after you leave the hospital or clinic. This is important.  Ask your health care provider how your surgical site will be marked or identified.  Ask your health care provider what steps will be taken to help prevent infection. These may include: ? Removing hair at the surgery site. ? Washing skin with a germ-killing soap. ? Taking antibiotic medicine. What happens during the procedure?   An IV will be inserted into one of your veins.  The site of the procedure will be marked.  You will be   given one or more of the following: ? A medicine to help you relax (sedative). ? A medicine to numb the area (local anesthetic). ? A medicine to make you fall asleep (general anesthetic). ? A medicine that is injected into your spine to numb the area below and slightly above the injection site (spinal  anesthetic). ? A medicine that is injected into an area of your body to numb everything below the injection site (regional anesthetic).  A thin tube (urinary catheter) will be put in your bladder to drain urine during and after the procedure.  Your surgeon will make a small cut (incision) in your lower back.  A tube will be inserted through the incision into your kidney.  Each kidney stone will be removed through this tube. Larger stones may need to be broken up with a high-intensity light beam (laser) or other tools.  After all of the stones have been removed, your health care provider may put in tubes to drain your bladder. Based on your condition: ? An internal tube, called a stent, may be put in your ureter. This will help drain urine from your kidney to your bladder. ? A surgical drain (nephrostomy tube) may be put in your kidney. The tube comes out through the incision in your lower back. This will help to drain urine or any fluid that builds up while your kidney heals.  Part of the incision may be closed with stitches (sutures).  A bandage (dressing) will be placed over the incision area. The procedure may vary among health care providers and hospitals. What happens after the procedure?  Your blood pressure, heart rate, breathing rate, and blood oxygen level will be monitored until you leave the hospital or clinic.  You may be given medicine for pain.  You will be shown how to do breathing exercises, such as coughing and breathing deeply. These will help to prevent pneumonia.  You will be encouraged to walk. Walking helps to prevent blood clots.  Your stent and urinary catheter will be removed after 1-2 days if there is only a small amount of blood in your urine.  You will be taught how to care for the catheter or nephrostomy tube, if you have them.  Do not drive for 24 hours if you were given a sedative during your procedure. Summary  Percutaneous nephrolithotomy is a  procedure to remove kidney stones.  Ask your health care provider about changing or stopping your regular medicines.  Before surgery, follow instructions from your health care provider about eating and drinking.  Plan to have someone take you home from the hospital or clinic. This information is not intended to replace advice given to you by your health care provider. Make sure you discuss any questions you have with your health care provider. Document Revised: 06/16/2018 Document Reviewed: 11/10/2017 Elsevier Patient Education  2020 [employer]  

## 2019-08-18 NOTE — Progress Notes (Signed)
Daily Session Note  Patient Details  Name: [name] MRN: [id number] Date of Birth: 1955-07-18 Referring Provider:     CARDIAC REHAB PHASE II ORIENTATION from 06/19/2019 in [place]  Referring Provider  [name]      Encounter Date: 08/18/2019  Check In: Session Check In - 08/18/19 0806      Check-In   Supervising physician immediately available to respond to emergencies  See telemetry face sheet for immediately available MD    Location  [facility]    Staff Present  Russella Dar, MS, EP, [facility], Exercise Physiologist;Keyauna Graefe Wynetta Emery, RN, BSN;Other    Virtual Visit  No    Medication changes reported      No    Fall or balance concerns reported     No    Tobacco Cessation  No Change    Warm-up and Cool-down  Performed as group-led instruction    Resistance Training Performed  Yes    VAD Patient?  No    PAD/SET Patient?  No      Pain Assessment   Currently in Pain?  No/denies    Pain Score  0-No pain    Multiple Pain Sites  No       Capillary Blood Glucose: No results found for this or any previous visit (from the past 24 hour(s)).    Social History   Tobacco Use  Smoking Status Former Smoker  . Packs/day: 0.50  . Years: 40.00  . Pack years: 20.00  . Types: Cigarettes  . Quit date: 03/12/2019  . Years since quitting: 0.4  Smokeless Tobacco Never Used    Goals Met:  Independence with exercise equipment Exercise tolerated well No report of cardiac concerns or symptoms Strength training completed today  Goals Unmet:  Not Applicable  Comments: Check out 915.   Dr. [name] is Medical Director for [facility].

## 2019-08-21 ENCOUNTER — Other Ambulatory Visit (HOSPITAL_COMMUNITY)
Admission: RE | Admit: 2019-08-21 | Discharge: 2019-08-21 | Disposition: A | Discharge status: Home / Self Care | Payer: Medicare Other | Source: Ambulatory Visit | Attending: Internal Medicine | Admitting: Internal Medicine

## 2019-08-21 ENCOUNTER — Other Ambulatory Visit: Payer: Self-pay

## 2019-08-21 ENCOUNTER — Encounter (HOSPITAL_COMMUNITY): Payer: Medicare Other

## 2019-08-21 DIAGNOSIS — Z01812 Encounter for preprocedural laboratory examination: Secondary | ICD-10-CM | POA: Diagnosis present

## 2019-08-21 DIAGNOSIS — Z20822 Contact with and (suspected) exposure to covid-19: Secondary | ICD-10-CM | POA: Diagnosis not present

## 2019-08-21 LAB — SARS CORONAVIRUS 2 (TAT 6-24 HRS): SARS Coronavirus 2: NEGATIVE

## 2019-08-21 NOTE — Progress Notes (Signed)
Cardiac Individual Treatment Plan  Patient Details  Name: [name] MRN: [id number] Date of Birth: 11-03-1955 Referring Provider:     CARDIAC REHAB PHASE II ORIENTATION from 06/19/2019 in [place]  Referring Provider  [name]      Initial Encounter Date:    CARDIAC REHAB PHASE II ORIENTATION from 06/19/2019 in [place]  Date  06/19/19      Visit Diagnosis: ST elevation myocardial infarction (STEMI), unspecified artery ([place])  Status post coronary artery stent placement  Patient's Home Medications on Admission:  Current Outpatient Medications:  .  allopurinol (ZYLOPRIM) 100 MG tablet, Take 100 mg by mouth daily. , Disp: , Rfl:  .  aspirin 81 MG chewable tablet, Chew 1 tablet (81 mg total) by mouth daily., Disp: 90 tablet, Rfl: 1 .  atorvastatin (LIPITOR) 80 MG tablet, Take 1 tablet (80 mg total) by mouth daily at 6 PM., Disp: 90 tablet, Rfl: 1 .  BIDIL 20-37.5 MG tablet, Take 1 tablet by mouth twice daily (Patient taking differently: Take 1 tablet by mouth in the morning and at bedtime. ), Disp: 60 tablet, Rfl: 1 .  carvedilol (COREG) 12.5 MG tablet, Take 12.5 mg by mouth 2 (two) times daily with a meal., Disp: , Rfl:  .  ergocalciferol (VITAMIN D2) 1.25 MG (50000 UT) capsule, Take 50,000 Units by mouth every Wednesday. , Disp: , Rfl:  .  furosemide (LASIX) 40 MG tablet, Take 1 tablet (40 mg total) by mouth daily., Disp: 35 tablet, Rfl: 5 .  ivabradine (CORLANOR) 5 MG TABS tablet, Take 1 tablet (5 mg total) by mouth 2 (two) times daily with a meal., Disp: 60 tablet, Rfl: 11 .  nitroGLYCERIN (NITROSTAT) 0.4 MG SL tablet, Place 1 tablet (0.4 mg total) under the tongue every 5 (five) minutes as needed., Disp: 25 tablet, Rfl: 2 .  sodium bicarbonate 650 MG tablet, Take 650 mg by mouth 3 (three) times daily., Disp: , Rfl:  .  ticagrelor (BRILINTA) 90 MG TABS tablet, Take 1 tablet (90 mg total) by mouth 2 (two) times daily., Disp: 180  tablet, Rfl: 1  Past Medical History: Past Medical History:  Diagnosis Date  . Acute ST elevation myocardial infarction (STEMI) due to occlusion of mid portion of left anterior descending (LAD) coronary artery ([place]) 03/12/2019  . Acute systolic heart failure ([place])   . CHF (congestive heart failure) ([place])   . CKD (chronic kidney disease) stage 4, GFR 15-29 ml/min ([facility])   . Heart attack ([place]) 03/15/2019  . Hypertension 2002  . Kidney stones   . Left knee pain   . Syncope 04/04/2019    Tobacco Use: Social History   Tobacco Use  Smoking Status Former Smoker  . Packs/day: 0.50  . Years: 40.00  . Pack years: 20.00  . Types: Cigarettes  . Quit date: 03/12/2019  . Years since quitting: 0.4  Smokeless Tobacco Never Used    Labs: Recent Chemical engineer    Labs for ITP Cardiac and Pulmonary Rehab Latest Ref Rng & Units 05/28/2016 03/12/2019 03/13/2019   Cholestrol 0 - 200 mg/dL 204(H) 193 -   LDLCALC 0 - 99 mg/dL 121(H) 114(H) -   HDL >40 mg/dL 48 42 -   Trlycerides <150 mg/dL 173(H) 184(H) -   Hemoglobin A1c 4.8 - 5.6 % 5.0 - 5.4   TCO2 22 - 32 mmol/L - 20(L) -      Capillary Blood Glucose: Lab Results  Component Value Date  GLUCAP 115 (H) 04/04/2019   GLUCAP 168 (H) 03/12/2019   GLUCAP 92 05/19/2016     Exercise Target Goals: Exercise Program Goal: Individual exercise prescription set using results from initial 6 min walk test and THRR while considering  patient's activity barriers and safety.   Exercise Prescription Goal: Starting with aerobic activity 30 plus minutes a day, 3 days per week for initial exercise prescription. Provide home exercise prescription and guidelines that participant acknowledges understanding prior to discharge.  Activity Barriers & Risk Stratification: Activity Barriers & Cardiac Risk Stratification - 06/19/19 1134      Activity Barriers & Cardiac Risk Stratification   Activity Barriers  Deconditioning   (L) knee problems. He needs a  replacement.   Cardiac Risk Stratification  High       6 Minute Walk: 6 Minute Walk    Row Name 06/19/19 1132         6 Minute Walk   Phase  Initial     Distance  1200 feet     Walk Time  6 minutes     # of Rest Breaks  0     MPH  2.27     METS  2.74     RPE  10     Perceived Dyspnea   11     VO2 Peak  12.31     Symptoms  No     Resting HR  83 bpm     Resting BP  90/62     Resting Oxygen Saturation   99 %     Exercise Oxygen Saturation  during 6 min walk  96 %     Max Ex. HR  114 bpm     Max Ex. BP  128/78     2 Minute Post BP  90/78        Oxygen Initial Assessment:   Oxygen Re-Evaluation:   Oxygen Discharge (Final Oxygen Re-Evaluation):   Initial Exercise Prescription: Initial Exercise Prescription - 06/19/19 1100      Date of Initial Exercise RX and Referring Provider   Date  06/19/19    Referring Provider  [name]    Expected Discharge Date  09/16/19      Treadmill   MPH  1.3    Grade  0    Minutes  17    METs  1.99      Recumbant Elliptical   Level  1    RPM  40    Watts  28    Minutes  20    METs  2.2      Prescription Details   Frequency (times per week)  3    Duration  Progress to 30 minutes of continuous aerobic without signs/symptoms of physical distress      Intensity   THRR 40-80% of Max Heartrate  [phone]    Ratings of Perceived Exertion  11-13    Perceived Dyspnea  0-4      Progression   Progression  Continue to progress workloads to maintain intensity without signs/symptoms of physical distress.      Resistance Training   Training Prescription  Yes    Weight  1    Reps  10-15       Perform Capillary Blood Glucose checks as needed.  Exercise Prescription Changes:  Exercise Prescription Changes    Row Name 07/04/19 1500 07/05/19 1400 07/25/19 0700 08/16/19 1500 08/21/19 1400     Response to Exercise   Blood Pressure (Admit)  110/88  118/100  100/60  102/60  100/70   Blood Pressure (Exercise)  110/80  122/102   110/70  110/78  100/62   Blood Pressure (Exit)  104/84  --  94/60  106/70  118/70   Heart Rate (Admit)  90 bpm  98 bpm  84 bpm  73 bpm  95 bpm   Heart Rate (Exercise)  105 bpm  105 bpm  105 bpm  83 bpm  74 bpm   Heart Rate (Exit)  99 bpm  --  93 bpm  75 bpm  86 bpm   Rating of Perceived Exertion (Exercise)  10  --  --  11  11   Symptoms  Knee Pain 7/10  extreme SOB  Fluid retention  None  None   Comments  --  Due to his knee had to change Exercise Prescription to Arm Crank and Nustep  Due to his knee had to change Exercise Prescription to Arm Crank and Nustep  Due to his knee had to change Exercise Prescription to Arm Crank and Nustep  Patient will be out for a couple of weeks due to ICD placement   Duration  Continue with 30 min of aerobic exercise without signs/symptoms of physical distress.  Continue with 30 min of aerobic exercise without signs/symptoms of physical distress.  Continue with 30 min of aerobic exercise without signs/symptoms of physical distress.  Continue with 30 min of aerobic exercise without signs/symptoms of physical distress.  Continue with 30 min of aerobic exercise without signs/symptoms of physical distress.   Intensity  THRR unchanged  THRR unchanged  THRR unchanged  THRR unchanged  THRR unchanged     Progression   Progression  Continue to progress workloads to maintain intensity without signs/symptoms of physical distress.  Continue to progress workloads to maintain intensity without signs/symptoms of physical distress.  Continue to progress workloads to maintain intensity without signs/symptoms of physical distress.  Continue to progress workloads to maintain intensity without signs/symptoms of physical distress.  Continue to progress workloads to maintain intensity without signs/symptoms of physical distress.   Average METs  1.65  1.65  2.07  2.02  2.05     Resistance Training   Training Prescription  Yes  Yes  Yes  Yes  Yes   Weight  2  2  2  3  3    Reps  10-15   10-15  10-15  10-15  10-15     Treadmill   MPH  1.4  --  1.4  1.5  1.5   Grade  0  --  0  0  0   Minutes  17  --  17  17  17    METs  2.07  --  2.07  2.14  2.14     Arm Ergometer   Level  --  1  1.2  1.3  1.3   Watts  --  25  11  13  13    Minutes  --  17  22  22  22    METs  --  2  1.8  1.9  1.9     Recumbant Elliptical   Level  1  --  --  --  --   RPM  38  --  --  --  --   Watts  46  --  --  --  --   Minutes  22  --  --  --  --   METs  2.5  --  --  --  --  T5 Nustep   Level  --  1  --  --  --   SPM  --  50  --  --  --   Minutes  --  22  --  --  --   METs  --  2  --  --  --     Home Exercise Plan   Plans to continue exercise at  Home (comment)  Home (comment)  Home (comment)  Home (comment)  Home (comment)   Frequency  Add 2 additional days to program exercise sessions.  Add 2 additional days to program exercise sessions.  Add 2 additional days to program exercise sessions.  Add 2 additional days to program exercise sessions.  Add 2 additional days to program exercise sessions.   Initial Home Exercises Provided  06/19/19  06/19/19  06/19/19  06/19/19  06/19/19      Exercise Comments:  Exercise Comments    Row Name 06/19/19 1147 07/04/19 1531 07/25/19 0801 08/16/19 1547 08/21/19 1452   Exercise Comments  This was patient orientation/assessment visit. He is looking forward to attending to continue to get stronger and to get mobility back to 100%.  This is patients 4th visit. He is coming regularily and was progressing. Will have to watch his knee pain and if the current exercise prescription needs to be ajusted to elivate his current knee pain and swelling.  We will continue to progress his workload as tolerated.  Patient is scheduled to have a pacemaker implanted on 08/24/19. He will get his COVID-19 test on 4/19 and then will not return to [facility] until to released by his cardiologist.  Patient will not be able to be progressed for about 2 weeks or so due to having ICD placement  surgery on 08/21/19. Once he returns we will continue to progress him as tolerated.      Exercise Goals and Review:  Exercise Goals    Row Name 06/19/19 1146             Exercise Goals   Increase Physical Activity  Yes       Intervention  Provide advice, education, support and counseling about physical activity/exercise needs.;Develop an individualized exercise prescription for aerobic and resistive training based on initial evaluation findings, risk stratification, comorbidities and participant's personal goals.       Expected Outcomes  Long Term: Add in home exercise to make exercise part of routine and to increase amount of physical activity.;Short Term: Attend rehab on a regular basis to increase amount of physical activity.       Increase Strength and Stamina  Yes       Intervention  Provide advice, education, support and counseling about physical activity/exercise needs.;Develop an individualized exercise prescription for aerobic and resistive training based on initial evaluation findings, risk stratification, comorbidities and participant's personal goals.       Expected Outcomes  Short Term: Perform resistance training exercises routinely during rehab and add in resistance training at home;Long Term: Improve cardiorespiratory fitness, muscular endurance and strength as measured by increased METs and functional capacity (6MWT)       Able to understand and use rate of perceived exertion (RPE) scale  Yes       Intervention  Provide education and explanation on how to use RPE scale       Expected Outcomes  Short Term: Able to use RPE daily in rehab to express subjective intensity level;Long Term:  Able to use RPE to guide intensity level when exercising  independently       Able to understand and use Dyspnea scale  Yes       Intervention  Provide education and explanation on how to use Dyspnea scale       Expected Outcomes  Short Term: Able to use Dyspnea scale daily in rehab to express  subjective sense of shortness of breath during exertion;Long Term: Able to use Dyspnea scale to guide intensity level when exercising independently       Knowledge and understanding of Target Heart Rate Range (THRR)  Yes       Intervention  Provide education and explanation of THRR including how the numbers were predicted and where they are located for reference       Expected Outcomes  Short Term: Able to use daily as guideline for intensity in rehab;Long Term: Able to use THRR to govern intensity when exercising independently       Able to check pulse independently  Yes       Intervention  Provide education and demonstration on how to check pulse in carotid and radial arteries.;Review the importance of being able to check your own pulse for safety during independent exercise       Expected Outcomes  Short Term: Able to explain why pulse checking is important during independent exercise;Long Term: Able to check pulse independently and accurately       Understanding of Exercise Prescription  Yes       Intervention  Provide education, explanation, and written materials on patient's individual exercise prescription       Expected Outcomes  Short Term: Able to explain program exercise prescription;Long Term: Able to explain home exercise prescription to exercise independently          Exercise Goals Re-Evaluation : Exercise Goals Re-Evaluation    Row Name 07/04/19 1527 07/25/19 0758 08/16/19 1546 08/21/19 1449       Exercise Goal Re-Evaluation   Exercise Goals Review  Increase Physical Activity;Increase Strength and Stamina  --  Increase Physical Activity;Increase Strength and Stamina  Knowledge and understanding of Target Heart Rate Range (THRR)    Comments  On patients last visit he c/o knee pain and swelling 7/10. We may have to switch the equipment that he is using if the TM and Ellipical continue to aggrivate his knee. Will cintinue to assess.  This is patients 9th visit in the program. He has  been retaining fluid on a regular basis. He has had 2 trips to the ER since starting the program due to fluid retenion. He has been instructed on ways in which to change his diet to help to keep his fluid down. He has expressed an understanding.  Progress as tolerated  Patient goals are to get mobility back 100% and to keep it. Due to his poor attendance he may not reach these goals. We will continue to encourage him to come on a more regular basis so that he can improve his mobility.    Expected Outcomes  To be able to progress as tolerated.  To keep his fluid at a healthful level so that he does not experienced SOB.  Get get mobility back to 100%  To improve his mobility.        Discharge Exercise Prescription (Final Exercise Prescription Changes): Exercise Prescription Changes - 08/21/19 1400      Response to Exercise   Blood Pressure (Admit)  100/70    Blood Pressure (Exercise)  100/62    Blood Pressure (Exit)  118/70  Heart Rate (Admit)  95 bpm    Heart Rate (Exercise)  74 bpm    Heart Rate (Exit)  86 bpm    Rating of Perceived Exertion (Exercise)  11    Symptoms  None    Comments  Patient will be out for a couple of weeks due to ICD placement    Duration  Continue with 30 min of aerobic exercise without signs/symptoms of physical distress.    Intensity  THRR unchanged      Progression   Progression  Continue to progress workloads to maintain intensity without signs/symptoms of physical distress.    Average METs  2.05      Resistance Training   Training Prescription  Yes    Weight  3    Reps  10-15      Treadmill   MPH  1.5    Grade  0    Minutes  17    METs  2.14      Arm Ergometer   Level  1.3    Watts  13    Minutes  22    METs  1.9      Home Exercise Plan   Plans to continue exercise at  Home (comment)    Frequency  Add 2 additional days to program exercise sessions.    Initial Home Exercises Provided  06/19/19       Nutrition:  Target Goals:  Understanding of nutrition guidelines, daily intake of sodium 1500mg , cholesterol 200mg , calories 30% from fat and 7% or less from saturated fats, daily to have 5 or more servings of fruits and vegetables.  Biometrics: Pre Biometrics - 06/19/19 1148      Pre Biometrics   Height  6' (1.829 m)    Weight  86.5 kg    Waist Circumference  40.5 inches    Hip Circumference  38 inches    Waist to Hip Ratio  1.07 %    BMI (Calculated)  25.86    Triceps Skinfold  6 mm    % Body Fat  22.9 %    Grip Strength  37.9 kg    Flexibility  0 in    Single Leg Stand  15.37 seconds        Nutrition Therapy Plan and Nutrition Goals: Nutrition Therapy & Goals - 08/21/19 1507      Personal Nutrition Goals   Comments  We are currently working on scheduling patients to meet with RD. Patient is supposed to be following a low Na diet and says he is trying but admitts to not always complying. He says he is working on portion sizes and trying to "eat less of everything".  Will encourage to meet with RD and continue to monitor for progress.      Intervention Plan   Intervention  Nutrition handout(s) given to patient.       Nutrition Assessments: Nutrition Assessments - 06/19/19 1151      MEDFICTS Scores   Pre Score  3       Nutrition Goals Re-Evaluation:   Nutrition Goals Discharge (Final Nutrition Goals Re-Evaluation):   Psychosocial: Target Goals: Acknowledge presence or absence of significant depression and/or stress, maximize coping skills, provide positive support system. Participant is able to verbalize types and ability to use techniques and skills needed for reducing stress and depression.  Initial Review & Psychosocial Screening: Initial Psych Review & Screening - 06/19/19 1150      Initial Review   Current issues with  None Identified      Family Dynamics   Good Support System?  Yes      Barriers   Psychosocial barriers to participate in program  There are no identifiable  barriers or psychosocial needs.      Screening Interventions   Interventions  Encouraged to exercise       Quality of Life Scores: Quality of Life - 06/19/19 1026      Quality of Life   Select  Quality of Life      Quality of Life Scores   Health/Function Pre  23.67 %    Socioeconomic Pre  25 %    Psych/Spiritual Pre  27.92 %    Family Pre  21.75 %    GLOBAL Pre  24.54 %      Scores of 19 and below usually indicate a poorer quality of life in these areas.  A difference of  2-3 points is a clinically meaningful difference.  A difference of 2-3 points in the total score of the Quality of Life Index has been associated with significant improvement in overall quality of life, self-image, physical symptoms, and general health in studies assessing change in quality of life.  PHQ-9: Recent Review Flowsheet Data    Depression screen [facility] 2/9 07/27/2019 06/19/2019 04/11/2019 03/21/2019   Decreased Interest 0 0 0 0   Down, Depressed, Hopeless 0 0 0 0   PHQ - 2 Score 0 0 0 0   Altered sleeping - 0 - -   Tired, decreased energy - 0 - -   Change in appetite - 0 - -   Feeling bad or failure about yourself  - 0 - -   Trouble concentrating - 0 - -   Moving slowly or fidgety/restless - 0 - -   Suicidal thoughts - 0 - -   PHQ-9 Score - 0 - -     Interpretation of Total Score  Total Score Depression Severity:  1-4 = Minimal depression, 5-9 = Mild depression, 10-14 = Moderate depression, 15-19 = Moderately severe depression, 20-27 = Severe depression   Psychosocial Evaluation and Intervention:   Psychosocial Re-Evaluation: Psychosocial Re-Evaluation    Row Name 07/05/19 1026 07/26/19 1608 08/21/19 1508         Psychosocial Re-Evaluation   Current issues with  None Identified  None Identified  None Identified     Comments  Patient's initial QOL score ws 24.54 and his PHQ-9 score was 0 with no psychosocial issued identified.  Patient's initial QOL score ws 24.54 and his PHQ-9 score was 0  with no psychosocial issued identified.  Patient's initial QOL score ws 24.54 and his PHQ-9 score was 0 with no psychosocial issued identified.     Expected Outcomes  Patient will have no psychosocial issued identified at discharge.  Patient will have no psychosocial issued identified at discharge.  Patient will have no psychosocial issued identified at discharge.     Interventions  Stress management education;Encouraged to attend Cardiac Rehabilitation for the exercise;Relaxation education  Stress management education;Encouraged to attend Cardiac Rehabilitation for the exercise;Relaxation education  Stress management education;Encouraged to attend Cardiac Rehabilitation for the exercise;Relaxation education     Continue Psychosocial Services   No Follow up required  No Follow up required  No Follow up required        Psychosocial Discharge (Final Psychosocial Re-Evaluation): Psychosocial Re-Evaluation - 08/21/19 1508      Psychosocial Re-Evaluation   Current issues with  None Identified  Comments  Patient's initial QOL score ws 24.54 and his PHQ-9 score was 0 with no psychosocial issued identified.    Expected Outcomes  Patient will have no psychosocial issued identified at discharge.    Interventions  Stress management education;Encouraged to attend Cardiac Rehabilitation for the exercise;Relaxation education    Continue Psychosocial Services   No Follow up required       Vocational Rehabilitation: Provide vocational rehab assistance to qualifying candidates.   Vocational Rehab Evaluation & Intervention: Vocational Rehab - 06/19/19 1151      Initial Vocational Rehab Evaluation & Intervention   Assessment shows need for Vocational Rehabilitation  No       Education: Education Goals: Education classes will be provided on a weekly basis, covering required topics. Participant will state understanding/return demonstration of topics presented.  Learning Barriers/Preferences: Learning  Barriers/Preferences - 06/19/19 1151      Learning Barriers/Preferences   Learning Barriers  None    Learning Preferences  Individual Instruction;Group Instruction;Skilled Demonstration       Education Topics: Hypertension, Hypertension Reduction -Define heart disease and high blood pressure. Discus how high blood pressure affects the body and ways to reduce high blood pressure.   Exercise and Your Heart -Discuss why it is important to exercise, the FITT principles of exercise, normal and abnormal responses to exercise, and how to exercise safely.   CARDIAC REHAB PHASE II EXERCISE from 08/16/2019 in [place]  Date  08/16/19  Educator  DC  Instruction Review Code  2- Demonstrated Understanding      Angina -Discuss definition of angina, causes of angina, treatment of angina, and how to decrease risk of having angina.   Cardiac Medications -Review what the following cardiac medications are used for, how they affect the body, and side effects that may occur when taking the medications.  Medications include Aspirin, Beta blockers, calcium channel blockers, ACE Inhibitors, angiotensin receptor blockers, diuretics, digoxin, and antihyperlipidemics.   Congestive Heart Failure -Discuss the definition of CHF, how to live with CHF, the signs and symptoms of CHF, and how keep track of weight and sodium intake.   Heart Disease and Intimacy -Discus the effect sexual activity has on the heart, how changes occur during intimacy as we age, and safety during sexual activity.   Smoking Cessation / COPD -Discuss different methods to quit smoking, the health benefits of quitting smoking, and the definition of COPD.   Nutrition I: Fats -Discuss the types of cholesterol, what cholesterol does to the heart, and how cholesterol levels can be controlled.   Nutrition II: Labels -Discuss the different components of food labels and how to read food label   CARDIAC REHAB  PHASE II EXERCISE from 08/16/2019 in [place]  Date  07/05/19  Educator  Etheleen Mayhew  Instruction Review Code  2- Demonstrated Understanding      Heart Parts/Heart Disease and PAD -Discuss the anatomy of the heart, the pathway of blood circulation through the heart, and these are affected by heart disease.   Stress I: Signs and Symptoms -Discuss the causes of stress, how stress may lead to anxiety and depression, and ways to limit stress.   CARDIAC REHAB PHASE II EXERCISE from 08/16/2019 in [place]  Date  07/19/19  Educator  Etheleen Mayhew  Instruction Review Code  2- Demonstrated Understanding      Stress II: Relaxation -Discuss different types of relaxation techniques to limit stress.   Warning Signs of Stroke / TIA -Discuss  definition of a stroke, what the signs and symptoms are of a stroke, and how to identify when someone is having stroke.   Knowledge Questionnaire Score: Knowledge Questionnaire Score - 06/19/19 1151      Knowledge Questionnaire Score   Pre Score  18/24       Core Components/Risk Factors/Patient Goals at Admission: Personal Goals and Risk Factors at Admission - 06/19/19 1151      Core Components/Risk Factors/Patient Goals on Admission    Weight Management  Weight Maintenance    Personal Goal Other  Yes    Personal Goal  Patient wants to get mobility back to 100% and to be able to maintain that level of strength.    Intervention  Atten CR 3 x week and to supplement with walking 2 x week at home.    Expected Outcomes  Reach expected outcomes       Core Components/Risk Factors/Patient Goals Review:  Goals and Risk Factor Review    Row Name 07/05/19 1021 07/26/19 1601 08/21/19 1501         Core Components/Risk Factors/Patient Goals Review   Personal Goals Review  Weight Management/Obesity;Other Get mobility back 100%; keep mobility.  Weight Management/Obesity;Other Get mobility back to 100%. Keep his  mobility.  Weight Management/Obesity;Other Get mobility back 100%; keep his mobility.     Review  Patient has completed 5 sessions gaining 5 lbs since his initial visit. He is new to the program. He complained of SOB today during his session. He has an echocardiogram completed today. Dr. Letha Cape office was notified and patient was advised to go to the ED. Will continue to monitor patient's progress.  Patient has completed 9 sessions with fluctuating weight. He was not allowed to complete his session 07/21/19 due to a 10 lb weight gain in one week and noted and reported SOB. Patient was advised to call the HF clinic when he got home and report his weight gain as he had been instructed. He choose to go to the ED due to his increased SOB after he left CR. He was evaluated and treated with IV Furosemide with improvement in symptoms. Patient is to f/u in HF clinic tomorrow 3/25. He admitts to adding salt to his food over the weekend and has been instructed on a low salt diet. He plans to return to the program 07/31/19. Will continue to monitor for progress toward meeting his goals once he returns.  Patient has completed 15 sessions with continued fluctuating weight. Patient's attendance has been inconsistent due to doctor's appointments and his 2 ED visits which required follow up MD appointments before he could return which is a barrier to his progress in the program. He is scheduled to have an ICD implanted 08/24/19 and will be out until MD clearance to return. Will continue to monitor for progress.     Expected Outcomes  Patient will continue to attend sessions and complete the program meeting his personal goals.  Patient will continue to attend sessions and complete the program meeting his personal goals.  Patient will continue to attend sessions and complete the program meeting his personal goals.        Core Components/Risk Factors/Patient Goals at Discharge (Final Review):  Goals and Risk Factor Review -  08/21/19 1501      Core Components/Risk Factors/Patient Goals Review   Personal Goals Review  Weight Management/Obesity;Other   Get mobility back 100%; keep his mobility.   Review  Patient has completed 15 sessions with continued fluctuating  weight. Patient's attendance has been inconsistent due to doctor's appointments and his 2 ED visits which required follow up MD appointments before he could return which is a barrier to his progress in the program. He is scheduled to have an ICD implanted 08/24/19 and will be out until MD clearance to return. Will continue to monitor for progress.    Expected Outcomes  Patient will continue to attend sessions and complete the program meeting his personal goals.       ITP Comments: ITP Comments    Row Name 06/19/19 1131 07/14/19 0909 08/07/19 1415       ITP Comments  Patient is eager to get started. He is coming by way of [facility].  Patient was evaluated in the ED 07/05/19 for SOB and diagnosed with acute on chronic CHF. He was instructed to f/u with the HF clinic which was 3/10. They increased his Lasix to 40 mg daily. Will continue to monitor.  Patient seen in HF clinic 08/01/19 as ED follow-up. They added Ivabradine 5 mg BID for tachycardia and Prednisone 40 mg for 3 days for gout pain in his ankles. They also advised him on his diet to avoid all salt and fried foods and eat more fruits and veggies. Will continue to monitor for progress.        Comments: ITP REVIEW Pt is making expected progress toward Cardiac Rehab goals after completing 15 sessions. Recommend continued exercise, life style modification, education, and increased stamina and strength.

## 2019-08-22 ENCOUNTER — Telehealth: Payer: Self-pay | Admitting: Internal Medicine

## 2019-08-22 NOTE — Telephone Encounter (Signed)
Returned call to Pt.  Went over instructions.  All questions answered.

## 2019-08-22 NOTE — Telephone Encounter (Signed)
Patient states that he lost his instructions on which medications to take and not to take prior to his surgery on Thursday 08/24/19. Please advise.

## 2019-08-23 ENCOUNTER — Encounter (HOSPITAL_COMMUNITY): Payer: Medicare Other

## 2019-08-24 ENCOUNTER — Ambulatory Visit (HOSPITAL_COMMUNITY)
Admission: RE | Disposition: A | Discharge status: Home / Self Care | Payer: Self-pay | Source: Home / Self Care | Attending: Internal Medicine

## 2019-08-24 ENCOUNTER — Ambulatory Visit (HOSPITAL_COMMUNITY): Payer: Medicare Other

## 2019-08-24 ENCOUNTER — Other Ambulatory Visit: Payer: Self-pay

## 2019-08-24 ENCOUNTER — Telehealth (HOSPITAL_COMMUNITY): Payer: Self-pay | Admitting: Pharmacist

## 2019-08-24 ENCOUNTER — Ambulatory Visit (HOSPITAL_COMMUNITY)
Admission: RE | Admit: 2019-08-24 | Discharge: 2019-08-24 | Disposition: A | Discharge status: Home / Self Care | Payer: Medicare Other | Attending: Internal Medicine | Admitting: Internal Medicine

## 2019-08-24 DIAGNOSIS — I2510 Atherosclerotic heart disease of native coronary artery without angina pectoris: Secondary | ICD-10-CM | POA: Diagnosis not present

## 2019-08-24 DIAGNOSIS — Z7982 Long term (current) use of aspirin: Secondary | ICD-10-CM | POA: Insufficient documentation

## 2019-08-24 DIAGNOSIS — N184 Chronic kidney disease, stage 4 (severe): Secondary | ICD-10-CM | POA: Diagnosis not present

## 2019-08-24 DIAGNOSIS — Z87891 Personal history of nicotine dependence: Secondary | ICD-10-CM | POA: Insufficient documentation

## 2019-08-24 DIAGNOSIS — I255 Ischemic cardiomyopathy: Secondary | ICD-10-CM | POA: Diagnosis not present

## 2019-08-24 DIAGNOSIS — I130 Hypertensive heart and chronic kidney disease with heart failure and stage 1 through stage 4 chronic kidney disease, or unspecified chronic kidney disease: Secondary | ICD-10-CM | POA: Diagnosis not present

## 2019-08-24 DIAGNOSIS — Z79899 Other long term (current) drug therapy: Secondary | ICD-10-CM | POA: Diagnosis not present

## 2019-08-24 DIAGNOSIS — I252 Old myocardial infarction: Secondary | ICD-10-CM | POA: Insufficient documentation

## 2019-08-24 DIAGNOSIS — I5022 Chronic systolic (congestive) heart failure: Secondary | ICD-10-CM | POA: Insufficient documentation

## 2019-08-24 DIAGNOSIS — Z006 Encounter for examination for normal comparison and control in clinical research program: Secondary | ICD-10-CM | POA: Diagnosis not present

## 2019-08-24 DIAGNOSIS — Z95810 Presence of automatic (implantable) cardiac defibrillator: Secondary | ICD-10-CM

## 2019-08-24 HISTORY — PX: ICD IMPLANT: EP1208

## 2019-08-24 SURGERY — ICD IMPLANT

## 2019-08-24 MED ORDER — ACETAMINOPHEN 325 MG PO TABS
325.0000 mg | ORAL_TABLET | ORAL | Status: DC | PRN
  Administered 2019-08-24: 650 mg via ORAL
  Filled 2019-08-24: qty 2

## 2019-08-24 MED ORDER — ONDANSETRON HCL 4 MG/2ML IJ SOLN: 4.0000 mg | Freq: Four times a day (QID) | INTRAMUSCULAR | Status: DC | PRN

## 2019-08-24 MED ORDER — FENTANYL CITRATE (PF) 100 MCG/2ML IJ SOLN
INTRAMUSCULAR | Status: AC
  Filled 2019-08-24: qty 2

## 2019-08-24 MED ORDER — MIDAZOLAM HCL 5 MG/5ML IJ SOLN
INTRAMUSCULAR | Status: DC | PRN
  Administered 2019-08-24 (×4): 2 mg via INTRAVENOUS

## 2019-08-24 MED ORDER — CEFAZOLIN SODIUM-DEXTROSE 1-4 GM/50ML-% IV SOLN: 1.0000 g | Freq: Once | INTRAVENOUS | Status: DC

## 2019-08-24 MED ORDER — HEPARIN (PORCINE) IN NACL 1000-0.9 UT/500ML-% IV SOLN
INTRAVENOUS | Status: AC
  Filled 2019-08-24: qty 500

## 2019-08-24 MED ORDER — CEFAZOLIN SODIUM-DEXTROSE 2-4 GM/100ML-% IV SOLN
2.0000 g | INTRAVENOUS | Status: AC
  Administered 2019-08-24: 2 g via INTRAVENOUS

## 2019-08-24 MED ORDER — CHLORHEXIDINE GLUCONATE 4 % EX LIQD: 4.0000 "application " | Freq: Once | CUTANEOUS | Status: DC

## 2019-08-24 MED ORDER — SODIUM CHLORIDE 0.9 % IV SOLN: INTRAVENOUS | Status: DC

## 2019-08-24 MED ORDER — LIDOCAINE HCL 1 % IJ SOLN
INTRAMUSCULAR | Status: AC
  Filled 2019-08-24: qty 20

## 2019-08-24 MED ORDER — LIDOCAINE HCL 1 % IJ SOLN
INTRAMUSCULAR | Status: AC
  Filled 2019-08-24: qty 60

## 2019-08-24 MED ORDER — FENTANYL CITRATE (PF) 100 MCG/2ML IJ SOLN
INTRAMUSCULAR | Status: DC | PRN
  Administered 2019-08-24 (×3): 25 ug via INTRAVENOUS

## 2019-08-24 MED ORDER — MIDAZOLAM HCL 5 MG/5ML IJ SOLN
INTRAMUSCULAR | Status: AC
  Filled 2019-08-24: qty 5

## 2019-08-24 MED ORDER — LIDOCAINE HCL (PF) 1 % IJ SOLN
INTRAMUSCULAR | Status: DC | PRN
  Administered 2019-08-24: 60 mL
  Administered 2019-08-24: 20 mL

## 2019-08-24 MED ORDER — CEFAZOLIN SODIUM-DEXTROSE 2-4 GM/100ML-% IV SOLN
INTRAVENOUS | Status: AC
  Filled 2019-08-24: qty 100

## 2019-08-24 MED ORDER — HEPARIN (PORCINE) IN NACL 1000-0.9 UT/500ML-% IV SOLN
INTRAVENOUS | Status: DC | PRN
  Administered 2019-08-24: 500 mL

## 2019-08-24 MED ORDER — SODIUM CHLORIDE 0.9 % IV SOLN
80.0000 mg | INTRAVENOUS | Status: AC
  Administered 2019-08-24: 80 mg

## 2019-08-24 MED ORDER — SODIUM CHLORIDE 0.9 % IV SOLN
INTRAVENOUS | Status: AC
  Filled 2019-08-24: qty 2

## 2019-08-24 SURGICAL SUPPLY — 6 items
CABLE SURGICAL S-101-97-12 (CABLE) ×2 IMPLANT
ICD MOMENTUM D120 (ICD Generator) ×1 IMPLANT
LEAD RELIANCE 0138-64 (Lead) ×1 IMPLANT
PAD PRO RADIOLUCENT 2001M-C (PAD) ×2 IMPLANT
SHEATH 9FR PRELUDE SNAP 13 (SHEATH) ×1 IMPLANT
TRAY PACEMAKER INSERTION (PACKS) ×2 IMPLANT

## 2019-08-24 NOTE — Telephone Encounter (Signed)
Patient Advocate Encounter   Received notification from [employer] that prior authorization for Corlanor is required.   PA submitted on CoverMyMeds Key [id number] Status is pending   Will continue to follow.  Audry Riles, PharmD, BCPS, BCCP, CPP Heart Failure Clinic Pharmacist [phone]

## 2019-08-24 NOTE — Interval H&P Note (Signed)
History and Physical Interval Note:  08/24/2019 7:24 AM  [name]  has presented today for surgery, with the diagnosis of cardiomyopathy.  The various methods of treatment have been discussed with the patient and family. After consideration of risks, benefits and other options for treatment, the patient has consented to  Procedure(s): ICD IMPLANT (N/A) as a surgical intervention.  The patient's history has been reviewed, patient examined, no change in status, stable for surgery.  I have reviewed the patient's chart and labs.  Questions were answered to the patient's satisfaction.     [name]

## 2019-08-24 NOTE — Telephone Encounter (Signed)
Advanced Heart Failure Patient Advocate Encounter  Prior Authorization for Corlanor has been approved.   Effective dates: 08/24/19 through 05/03/20  Audry Riles, PharmD, BCPS, BCCP, CPP Heart Failure Clinic Pharmacist [phone]

## 2019-08-24 NOTE — Discharge Instructions (Signed)
    Supplemental Discharge Instructions for  Pacemaker/Defibrillator Patients  Activity No heavy lifting or vigorous activity with your left/right arm for 6 to 8 weeks.  Do not raise your left/right arm above your head for one week.  Gradually raise your affected arm as drawn below.           __        08/28/19                        08/29/19                    08/30/19                     08/31/19  NO DRIVING for   1 week  ; you may begin driving on   1/94/17  .  WOUND CARE - Keep the wound area clean and dry.  Do not get this area wet for one week. No showers for one week; you may shower on   08/31/19  . - The tape/steri-strips on your wound will fall off; do not pull them off.  No bandage is needed on the site.  DO  NOT apply any creams, oils, or ointments to the wound area. - If you notice any drainage or discharge from the wound, any swelling or bruising at the site, or you develop a fever > 101? F after you are discharged home, call the office at once.  Special Instructions - You are still able to use cellular telephones; use the ear opposite the side where you have your pacemaker/defibrillator.  Avoid carrying your cellular phone near your device. - When traveling through airports, show security personnel your identification card to avoid being screened in the metal detectors.  Ask the security personnel to use the hand wand. - Avoid arc welding equipment, MRI testing (magnetic resonance imaging), TENS units (transcutaneous nerve stimulators).  Call the office for questions about other devices. - Avoid electrical appliances that are in poor condition or are not properly grounded. - Microwave ovens are safe to be near or to operate.  Additional information for defibrillator patients should your device go off: - If your device goes off ONCE and you feel fine afterward, notify the device clinic nurses. - If your device goes off ONCE and you do not feel well afterward, call 911. - If your  device goes off TWICE, call 911. - If your device goes off THREE times in one day, call 911.  DO NOT DRIVE YOURSELF OR A FAMILY MEMBER WITH A DEFIBRILLATOR TO THE HOSPITAL--CALL 911.

## 2019-08-25 ENCOUNTER — Encounter (HOSPITAL_COMMUNITY): Payer: Medicare Other

## 2019-08-25 ENCOUNTER — Other Ambulatory Visit (HOSPITAL_COMMUNITY): Payer: Self-pay | Admitting: Urology

## 2019-08-25 ENCOUNTER — Other Ambulatory Visit: Payer: Self-pay | Admitting: Urology

## 2019-08-25 DIAGNOSIS — N200 Calculus of kidney: Secondary | ICD-10-CM

## 2019-08-25 MED FILL — Gentamicin Sulfate Inj 40 MG/ML: INTRAMUSCULAR | Qty: 80 | Status: AC

## 2019-08-26 ENCOUNTER — Other Ambulatory Visit (HOSPITAL_COMMUNITY): Payer: Self-pay | Admitting: Internal Medicine

## 2019-08-28 ENCOUNTER — Encounter (HOSPITAL_COMMUNITY): Payer: Medicare Other

## 2019-08-30 ENCOUNTER — Encounter (HOSPITAL_COMMUNITY): Payer: Medicare Other

## 2019-09-01 ENCOUNTER — Encounter (HOSPITAL_COMMUNITY): Payer: Medicare Other

## 2019-09-04 ENCOUNTER — Encounter (HOSPITAL_COMMUNITY): Payer: Medicaid Other

## 2019-09-05 ENCOUNTER — Other Ambulatory Visit: Payer: Self-pay

## 2019-09-05 ENCOUNTER — Ambulatory Visit (INDEPENDENT_AMBULATORY_CARE_PROVIDER_SITE_OTHER): Payer: Medicare HMO | Admitting: *Deleted

## 2019-09-05 DIAGNOSIS — I255 Ischemic cardiomyopathy: Secondary | ICD-10-CM | POA: Diagnosis not present

## 2019-09-05 NOTE — Progress Notes (Signed)
Wound check appointment. Steri-strips removed. Wound without redness or edema. Incision edges approximated, wound well healed. Normal device function. Thresholds, sensing, and impedances consistent with implant measurements. Device programmed at 3.5V for extra safety margin until 3 month visit. Histogram distribution appropriate for patient and level of activity. No ventricular arrhythmias noted. Patient educated about wound care, arm mobility, lifting restrictions, shock plan. Remote transmissions every 3 months and next remote scheduled for 11/24/19. ROV with GT 12/01/19.

## 2019-09-06 ENCOUNTER — Encounter (HOSPITAL_COMMUNITY): Payer: Medicaid Other

## 2019-09-07 NOTE — H&P (View-Only) (Signed)
Subjective: 1. Hydronephrosis with renal and ureteral calculus obstruction     [name] returns today in f/u for his history of Left renal staghorn stone.  [name] is to have a left PCNL on 5/25 with possible second look on 09/28/19.  [name] has had no hematuria and only minimal pain.  [name] has had the defibrillator implanted and is recovering from the procedure well.   GU Hx: [name] is a 64yo AAM who is sent in consultation by Dr. Dicky Doe for bilateral nephrolithiasis found on a recent renal US for evaluation of CKD 4.  There is a 1.6cm RLP stone and a probable staghorn stone on the left with moderate hydronephrosis.  [name] had a repeat CT prior to this visit to better assess the stone burden and the findings were confirmed with left upper pole obstruction.   There was a 1.6cm LLP partial staghorn stone and an 66mm LMP stone and 1.2cm LUP stone with upper pole obstruction and atrophy.  Cysts were present bilateral on both the CT and renal US.   His recent Cr is 2.78 which is stable over the past 3 months but down from the high.  His Calcium is 10.7 and [name] says [name] is being evaluated for hyperparathyroidism.   [name] has no pain.  [name] has no hematuria. [name] has had prior stones and had a lithotripsy several years ago.  [name] is currently on Brilinta for a coronary stent that was placed in November and is going to be off of it from placement of a defibrillator next week.  [name] will continue an 81mg  ASA.  .   ROS:  Review of Systems  Respiratory: Positive for shortness of breath (mild and infrequent).   Cardiovascular: Negative for chest pain.  All other systems reviewed and are negative.    No Known Allergies  Past Medical History:  Diagnosis Date  . Acute ST elevation myocardial infarction (STEMI) due to occlusion of mid portion of left anterior descending (LAD) coronary artery ([place]) 03/12/2019  . Acute systolic heart failure ([place])   . CHF (congestive heart failure) ([place])   . CKD (chronic kidney  disease) stage 4, GFR 15-29 ml/min ([facility])   . Heart attack ([place]) 03/15/2019  . Hypertension 2002  . Kidney stones   . Left knee pain   . Syncope 04/04/2019    Past Surgical History:  Procedure Laterality Date  . AMPUTATION TOE Left 1991   2 digit of left foot  . CHOLECYSTECTOMY    . CORONARY/GRAFT ACUTE MI REVASCULARIZATION N/A 03/12/2019   Procedure: Coronary/Graft Acute MI Revascularization;  Surgeon: Belva Crome, MD;  Location: [facility];  Service: Cardiovascular;  Laterality: N/A;  . ICD IMPLANT N/A 08/24/2019   Procedure: ICD IMPLANT;  Surgeon: Evans Lance, MD;  Location: [facility];  Service: Cardiovascular;  Laterality: N/A;  . LEFT HEART CATH AND CORONARY ANGIOGRAPHY N/A 03/12/2019   Procedure: LEFT HEART CATH AND CORONARY ANGIOGRAPHY;  Surgeon: Belva Crome, MD;  Location: [facility];  Service: Cardiovascular;  Laterality: N/A;  . LITHOTRIPSY Right     Social History   Socioeconomic History  . Marital status: Single    Spouse name: Not on file  . Number of children: 0  . Years of education: Not on file  . Highest education level: Some college, no degree  Occupational History  . Occupation: retired  Tobacco Use  . Smoking status: Former Smoker    Packs/day: 0.50  Years: 40.00    Pack years: 20.00    Types: Cigarettes    Quit date: 03/12/2019    Years since quitting: 0.4  . Smokeless tobacco: Never Used  Substance and Sexual Activity  . Alcohol use: No  . Drug use: No  . Sexual activity: Not Currently  Other Topics Concern  . 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 Difficulty of Paying Living Expenses: Not hard at all  Food Insecurity: No Food Insecurity  . Worried About Charity fundraiser in the Last Year: Never true  . Ran Out of Food in the Last Year: Never true  Transportation Needs: No Transportation Needs  . Lack of Transportation (Medical): No  . Lack of Transportation (Non-Medical): No  Physical Activity: Sufficiently Active  . Days of Exercise per Week: 3 days  . Minutes of Exercise per Session: 60 min  Stress: No Stress Concern Present  . Feeling of Stress : Not at all  Social Connections: Somewhat Isolated  . Frequency of Communication with Friends and Family: More than three times a week  . Frequency of Social Gatherings with Friends and Family: More than three times a week  . Attends Religious Services: More than 4 times per year  . Active Member of Clubs or Organizations: No  . Attends Archivist Meetings: Never  . Marital Status: Never married  Intimate Partner Violence: Not At Risk  . Fear of Current or Ex-Partner: No  . Emotionally Abused: No  . Physically Abused: No  . Sexually Abused: No    Family History  Problem Relation Age of Onset  . Heart failure Mother   . Heart disease Mother   . Cancer Mother        breast  . Heart attack Father   . Hypertension Father   . Heart disease Father   . Diabetes Brother     Anti-infectives: Anti-infectives (From admission, onward)   None      Current Outpatient Medications  Medication Sig Dispense Refill  . allopurinol (ZYLOPRIM) 100 MG tablet Take 100 mg by mouth daily.     Marland Kitchen aspirin 81 MG chewable tablet Chew 1 tablet (81 mg total) by mouth daily. 90 tablet 1  . atorvastatin (LIPITOR) 80 MG tablet Take 1 tablet (80 mg total) by mouth daily at 6 PM. 90 tablet 1  . BIDIL 20-37.5 MG tablet Take 1 tablet by mouth twice daily (Patient taking differently: Take 1 tablet by mouth in the morning and at bedtime. ) 60 tablet 1  . carvedilol (COREG)  12.5 MG tablet TAKE 1 TABLET BY MOUTH TWICE DAILY WITH MEALS 180 tablet 3  . colchicine 0.6 MG tablet Take by mouth.    . ergocalciferol (VITAMIN D2) 1.25 MG (50000 UT) capsule Take 50,000 Units by mouth every Wednesday.     . furosemide (LASIX) 40 MG tablet TAKE 1 TABLET BY MOUTH ONCE DAILY AS NEEDED FOR FLUID 30 tablet 5  .  ivabradine (CORLANOR) 5 MG TABS tablet Take 1 tablet (5 mg total) by mouth 2 (two) times daily with a meal. 60 tablet 11  . nitroGLYCERIN (NITROSTAT) 0.4 MG SL tablet Place 1 tablet (0.4 mg total) under the tongue every 5 (five) minutes as needed. 25 tablet 2  . sodium bicarbonate 650 MG tablet Take 650 mg by mouth 3 (three) times daily.    . ticagrelor (BRILINTA) 90 MG TABS tablet Take 1 tablet (90 mg total) by mouth 2 (two) times daily. 180 tablet 1   No current facility-administered medications for this visit.     Objective:  BP 135/87   Pulse (!) 58   Temp (!) 95.5 F (35.3 C)   Ht 6' (1.829 m)   Wt 198 lb (89.8 kg)   BMI 26.85 kg/m    Physical Exam Vitals reviewed.  Constitutional:      Appearance: Normal appearance.  Cardiovascular:     Rate and Rhythm: Normal rate and regular rhythm.     Heart sounds: Normal heart sounds.  Pulmonary:     Effort: Pulmonary effort is normal. No respiratory distress.     Breath sounds: Normal breath sounds.  Neurological:     Mental Status: [name] is alert.     Lab Results:  Results for orders placed or performed in visit on 09/08/19 (from the past 24 hour(s))  POCT urinalysis dipstick     Status: Abnormal   Collection Time: 09/08/19  9:59 AM  Result Value Ref Range   Color, UA light yellow    Clarity, UA     Glucose, UA Negative Negative   Bilirubin, UA neg    Ketones, UA neg    Spec Grav, UA <=1.005 (A) 1.010 - 1.025   Blood, UA neg    pH, UA 6.5 5.0 - 8.0   Protein, UA Negative Negative   Urobilinogen, UA 0.2 0.2 or 1.0 E.U./dL   Nitrite, UA neg    Leukocytes, UA Negative Negative   Appearance clear     Odor       BMET His Cr is 2.78 and calcium is 10.7.   Studies/Results: CLINICAL DATA:  64 year old male with history of hematuria.  EXAM: CT ABDOMEN AND PELVIS WITHOUT CONTRAST  TECHNIQUE: Multidetector CT imaging of the abdomen and pelvis was performed following the standard protocol without IV contrast.  COMPARISON:  CT the abdomen and pelvis 04/08/2011.  FINDINGS: Lower chest: Patchy areas of ground-glass attenuation noted throughout the lung bases bilaterally.  Hepatobiliary: No suspicious cystic or solid hepatic lesions are confidently identified on today's noncontrast CT examination. Status post cholecystectomy.  Pancreas: No definite pancreatic mass or peripancreatic fluid collections or inflammatory changes identified on today's noncontrast CT examination.  Spleen: Unremarkable.  Adrenals/Urinary Tract: Numerous nonobstructive calculi are noted within the collecting systems of the kidneys bilaterally, several of which have a staghorn configuration, measuring up to 1.9 x 1.4 cm in the lower pole collecting system of the left kidney. Additional prominent calculus in the upper pole collecting system of the left kidney measuring 1.7 x 1.8 cm which is associated with some upper pole caliectasis. No additional calculi are noted along the course of either ureter or within the lumen of the urinary bladder. No hydroureteronephrosis. Multiple low-attenuation renal lesions bilaterally, incompletely characterized on today's non-contrast CT examination, but statistically likely to represent cysts, largest of which is in the lower pole of the left kidney measuring 5.7 cm in diameter. Unenhanced appearance of the urinary bladder is normal. Bilateral  adrenal glands are normal in appearance.  Stomach/Bowel: Unenhanced appearance of the stomach is normal. No pathologic dilatation of small bowel or colon. Normal appendix.  Vascular/Lymphatic: Aortic  atherosclerosis, without evidence of aneurysm in the abdominal or pelvic vasculature. No lymphadenopathy noted in the abdomen or pelvis.  Reproductive: Prostate gland and seminal vesicles are unremarkable in appearance.  Other: No significant volume of ascites.  No pneumoperitoneum.  Musculoskeletal: There are no aggressive appearing lytic or blastic lesions noted in the visualized portions of the skeleton.  IMPRESSION: 1. Numerous nonobstructive calculi in the collecting systems of both kidneys, several of which are staghorn in appearance, largest of which is in the lower pole of the left kidney measuring 1.9 x 1.4 cm. In addition, there is probable upper pole caliectasis in the left kidney likely related to obstruction from an upper pole calculus measuring 1.7 x 1.8 cm. 2. No ureteral stones or findings of urinary tract obstruction are noted at this time. 3. Aortic atherosclerosis.   Electronically Signed   By: Vinnie Langton M.D.   On: 07/11/2019 10:17   Assessment/Plan: Bilateral nephrolithiasis with a partial staghorn on the left and moderate left upper pole hydronephrosis with atrophy of the upper pole.  [name] needs a  left PCNL when [name] is able to stop Brilinta for the procedure.   I will check with cardiology on this.  I reviewed the risks of the procedure and gave him a handout that outlines those risks at his last visit.        CKD4 which is stable over the last few months and could be aggravated by the obstruction of the upper pole but I don't think I could reliably get a stent in to the upper pole and [name] will need a perc for this.    CAD.  [name] is on Brilinta and ASA but has been cleared to come off for surgery.    No orders of the defined types were placed in this encounter.    Orders Placed This Encounter  Procedures  . POCT urinalysis dipstick     Return in about 5 weeks (around 10/13/2019) for [name] will need a post op appointment 1-2 weeks after 09/28/19. Marland Kitchen     CC: Dr. Benny Lennert, Dr. Dicky Doe and Dr. Glori Bickers.      Irine Seal 09/08/2019 [phone]

## 2019-09-07 NOTE — Progress Notes (Signed)
Subjective: 1. Hydronephrosis with renal and ureteral calculus obstruction     Mr. Swor returns today in f/u for his history of Left renal staghorn stone.  He is to have a left PCNL on 5/25 with possible second look on 09/28/19.  He has had no hematuria and only minimal pain.  He has had the defibrillator implanted and is recovering from the procedure well.   GU Hx: Mr. Oscar is a 64yo AAM who is sent in consultation by Dr. Dicky Doe for bilateral nephrolithiasis found on a recent renal US for evaluation of CKD 4.  There is a 1.6cm RLP stone and a probable staghorn stone on the left with moderate hydronephrosis.  He had a repeat CT prior to this visit to better assess the stone burden and the findings were confirmed with left upper pole obstruction.   There was a 1.6cm LLP partial staghorn stone and an 47mm LMP stone and 1.2cm LUP stone with upper pole obstruction and atrophy.  Cysts were present bilateral on both the CT and renal US.   His recent Cr is 2.78 which is stable over the past 3 months but down from the high.  His Calcium is 10.7 and he says he is being evaluated for hyperparathyroidism.   He has no pain.  He has no hematuria. He has had prior stones and had a lithotripsy several years ago.  He is currently on Brilinta for a coronary stent that was placed in November and is going to be off of it from placement of a defibrillator next week.  He will continue an 81mg  ASA.  .   ROS:  Review of Systems  Respiratory: Positive for shortness of breath (mild and infrequent).   Cardiovascular: Negative for chest pain.  All other systems reviewed and are negative.    No Known Allergies  Past Medical History:  Diagnosis Date  . Acute ST elevation myocardial infarction (STEMI) due to occlusion of mid portion of left anterior descending (LAD) coronary artery ([place]) 03/12/2019  . Acute systolic heart failure ([place])   . CHF (congestive heart failure) ([place])   . CKD (chronic kidney  disease) stage 4, GFR 15-29 ml/min ([facility])   . Heart attack ([place]) 03/15/2019  . Hypertension 2002  . Kidney stones   . Left knee pain   . Syncope 04/04/2019    Past Surgical History:  Procedure Laterality Date  . AMPUTATION TOE Left 1991   2 digit of left foot  . CHOLECYSTECTOMY    . CORONARY/GRAFT ACUTE MI REVASCULARIZATION N/A 03/12/2019   Procedure: Coronary/Graft Acute MI Revascularization;  Surgeon: Belva Crome, MD;  Location: [facility];  Service: Cardiovascular;  Laterality: N/A;  . ICD IMPLANT N/A 08/24/2019   Procedure: ICD IMPLANT;  Surgeon: Evans Lance, MD;  Location: [facility];  Service: Cardiovascular;  Laterality: N/A;  . LEFT HEART CATH AND CORONARY ANGIOGRAPHY N/A 03/12/2019   Procedure: LEFT HEART CATH AND CORONARY ANGIOGRAPHY;  Surgeon: Belva Crome, MD;  Location: [facility];  Service: Cardiovascular;  Laterality: N/A;  . LITHOTRIPSY Right     Social History   Socioeconomic History  . Marital status: Single    Spouse name: Not on file  . Number of children: 0  . Years of education: Not on file  . Highest education level: Some college, no degree  Occupational History  . Occupation: retired  Tobacco Use  . Smoking status: Former Smoker    Packs/day: 0.50  Years: 40.00    Pack years: 20.00    Types: Cigarettes    Quit date: 03/12/2019    Years since quitting: 0.4  . Smokeless tobacco: Never Used  Substance and Sexual Activity  . Alcohol use: No  . Drug use: No  . Sexual activity: Not Currently  Other Topics Concern  . 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 Difficulty of Paying Living Expenses: Not hard at all  Food Insecurity: No Food Insecurity  . Worried About Charity fundraiser in the Last Year: Never true  . Ran Out of Food in the Last Year: Never true  Transportation Needs: No Transportation Needs  . Lack of Transportation (Medical): No  . Lack of Transportation (Non-Medical): No  Physical Activity: Sufficiently Active  . Days of Exercise per Week: 3 days  . Minutes of Exercise per Session: 60 min  Stress: No Stress Concern Present  . Feeling of Stress : Not at all  Social Connections: Somewhat Isolated  . Frequency of Communication with Friends and Family: More than three times a week  . Frequency of Social Gatherings with Friends and Family: More than three times a week  . Attends Religious Services: More than 4 times per year  . Active Member of Clubs or Organizations: No  . Attends Archivist Meetings: Never  . Marital Status: Never married  Intimate Partner Violence: Not At Risk  . Fear of Current or Ex-Partner: No  . Emotionally Abused: No  . Physically Abused: No  . Sexually Abused: No    Family History  Problem Relation Age of Onset  . Heart failure Mother   . Heart disease Mother   . Cancer Mother        breast  . Heart attack Father   . Hypertension Father   . Heart disease Father   . Diabetes Brother     Anti-infectives: Anti-infectives (From admission, onward)   None      Current Outpatient Medications  Medication Sig Dispense Refill  . allopurinol (ZYLOPRIM) 100 MG tablet Take 100 mg by mouth daily.     Marland Kitchen aspirin 81 MG chewable tablet Chew 1 tablet (81 mg total) by mouth daily. 90 tablet 1  . atorvastatin (LIPITOR) 80 MG tablet Take 1 tablet (80 mg total) by mouth daily at 6 PM. 90 tablet 1  . BIDIL 20-37.5 MG tablet Take 1 tablet by mouth twice daily (Patient taking differently: Take 1 tablet by mouth in the morning and at bedtime. ) 60 tablet 1  . carvedilol (COREG)  12.5 MG tablet TAKE 1 TABLET BY MOUTH TWICE DAILY WITH MEALS 180 tablet 3  . colchicine 0.6 MG tablet Take by mouth.    . ergocalciferol (VITAMIN D2) 1.25 MG (50000 UT) capsule Take 50,000 Units by mouth every Wednesday.     . furosemide (LASIX) 40 MG tablet TAKE 1 TABLET BY MOUTH ONCE DAILY AS NEEDED FOR FLUID 30 tablet 5  .  ivabradine (CORLANOR) 5 MG TABS tablet Take 1 tablet (5 mg total) by mouth 2 (two) times daily with a meal. 60 tablet 11  . nitroGLYCERIN (NITROSTAT) 0.4 MG SL tablet Place 1 tablet (0.4 mg total) under the tongue every 5 (five) minutes as needed. 25 tablet 2  . sodium bicarbonate 650 MG tablet Take 650 mg by mouth 3 (three) times daily.    . ticagrelor (BRILINTA) 90 MG TABS tablet Take 1 tablet (90 mg total) by mouth 2 (two) times daily. 180 tablet 1   No current facility-administered medications for this visit.     Objective:  BP 135/87   Pulse (!) 58   Temp (!) 95.5 F (35.3 C)   Ht 6' (1.829 m)   Wt 198 lb (89.8 kg)   BMI 26.85 kg/m    Physical Exam Vitals reviewed.  Constitutional:      Appearance: Normal appearance.  Cardiovascular:     Rate and Rhythm: Normal rate and regular rhythm.     Heart sounds: Normal heart sounds.  Pulmonary:     Effort: Pulmonary effort is normal. No respiratory distress.     Breath sounds: Normal breath sounds.  Neurological:     Mental Status: He is alert.     Lab Results:  Results for orders placed or performed in visit on 09/08/19 (from the past 24 hour(s))  POCT urinalysis dipstick     Status: Abnormal   Collection Time: 09/08/19  9:59 AM  Result Value Ref Range   Color, UA light yellow    Clarity, UA     Glucose, UA Negative Negative   Bilirubin, UA neg    Ketones, UA neg    Spec Grav, UA <=1.005 (A) 1.010 - 1.025   Blood, UA neg    pH, UA 6.5 5.0 - 8.0   Protein, UA Negative Negative   Urobilinogen, UA 0.2 0.2 or 1.0 E.U./dL   Nitrite, UA neg    Leukocytes, UA Negative Negative   Appearance clear     Odor       BMET His Cr is 2.78 and calcium is 10.7.   Studies/Results: CLINICAL DATA:  64 year old male with history of hematuria.  EXAM: CT ABDOMEN AND PELVIS WITHOUT CONTRAST  TECHNIQUE: Multidetector CT imaging of the abdomen and pelvis was performed following the standard protocol without IV contrast.  COMPARISON:  CT the abdomen and pelvis 04/08/2011.  FINDINGS: Lower chest: Patchy areas of ground-glass attenuation noted throughout the lung bases bilaterally.  Hepatobiliary: No suspicious cystic or solid hepatic lesions are confidently identified on today's noncontrast CT examination. Status post cholecystectomy.  Pancreas: No definite pancreatic mass or peripancreatic fluid collections or inflammatory changes identified on today's noncontrast CT examination.  Spleen: Unremarkable.  Adrenals/Urinary Tract: Numerous nonobstructive calculi are noted within the collecting systems of the kidneys bilaterally, several of which have a staghorn configuration, measuring up to 1.9 x 1.4 cm in the lower pole collecting system of the left kidney. Additional prominent calculus in the upper pole collecting system of the left kidney measuring 1.7 x 1.8 cm which is associated with some upper pole caliectasis. No additional calculi are noted along the course of either ureter or within the lumen of the urinary bladder. No hydroureteronephrosis. Multiple low-attenuation renal lesions bilaterally, incompletely characterized on today's non-contrast CT examination, but statistically likely to represent cysts, largest of which is in the lower pole of the left kidney measuring 5.7 cm in diameter. Unenhanced appearance of the urinary bladder is normal. Bilateral  adrenal glands are normal in appearance.  Stomach/Bowel: Unenhanced appearance of the stomach is normal. No pathologic dilatation of small bowel or colon. Normal appendix.  Vascular/Lymphatic: Aortic  atherosclerosis, without evidence of aneurysm in the abdominal or pelvic vasculature. No lymphadenopathy noted in the abdomen or pelvis.  Reproductive: Prostate gland and seminal vesicles are unremarkable in appearance.  Other: No significant volume of ascites.  No pneumoperitoneum.  Musculoskeletal: There are no aggressive appearing lytic or blastic lesions noted in the visualized portions of the skeleton.  IMPRESSION: 1. Numerous nonobstructive calculi in the collecting systems of both kidneys, several of which are staghorn in appearance, largest of which is in the lower pole of the left kidney measuring 1.9 x 1.4 cm. In addition, there is probable upper pole caliectasis in the left kidney likely related to obstruction from an upper pole calculus measuring 1.7 x 1.8 cm. 2. No ureteral stones or findings of urinary tract obstruction are noted at this time. 3. Aortic atherosclerosis.   Electronically Signed   By: Vinnie Langton M.D.   On: 07/11/2019 10:17   Assessment/Plan: Bilateral nephrolithiasis with a partial staghorn on the left and moderate left upper pole hydronephrosis with atrophy of the upper pole.  He needs a  left PCNL when he is able to stop Brilinta for the procedure.   I will check with cardiology on this.  I reviewed the risks of the procedure and gave him a handout that outlines those risks at his last visit.        CKD4 which is stable over the last few months and could be aggravated by the obstruction of the upper pole but I don't think I could reliably get a stent in to the upper pole and he will need a perc for this.    CAD.  He is on Brilinta and ASA but has been cleared to come off for surgery.    No orders of the defined types were placed in this encounter.    Orders Placed This Encounter  Procedures  . POCT urinalysis dipstick     Return in about 5 weeks (around 10/13/2019) for He will need a post op appointment 1-2 weeks after 09/28/19. Marland Kitchen     CC: Dr. Benny Lennert, Dr. Dicky Doe and Dr. Glori Bickers.      Irine Seal 09/08/2019 [phone]

## 2019-09-08 ENCOUNTER — Other Ambulatory Visit: Payer: Self-pay

## 2019-09-08 ENCOUNTER — Ambulatory Visit (INDEPENDENT_AMBULATORY_CARE_PROVIDER_SITE_OTHER): Payer: Medicare HMO | Admitting: Urology

## 2019-09-08 ENCOUNTER — Encounter (HOSPITAL_COMMUNITY): Payer: Medicaid Other

## 2019-09-08 ENCOUNTER — Encounter: Payer: Self-pay | Admitting: Urology

## 2019-09-08 VITALS — BP 135/87 | HR 58 | Temp 95.5°F | Ht 72.0 in | Wt 198.0 lb

## 2019-09-08 DIAGNOSIS — N132 Hydronephrosis with renal and ureteral calculous obstruction: Secondary | ICD-10-CM | POA: Diagnosis not present

## 2019-09-08 DIAGNOSIS — I255 Ischemic cardiomyopathy: Secondary | ICD-10-CM | POA: Diagnosis not present

## 2019-09-08 LAB — POCT URINALYSIS DIPSTICK
Bilirubin, UA: NEGATIVE
Blood, UA: NEGATIVE
Glucose, UA: NEGATIVE
Ketones, UA: NEGATIVE
Leukocytes, UA: NEGATIVE
Nitrite, UA: NEGATIVE
Protein, UA: NEGATIVE
Spec Grav, UA: <=1.005 — AB (ref 1.010–1.025)
Urobilinogen, UA: 0.2 E.U./dL
pH, UA: 6.5 (ref 5.0–8.0)

## 2019-09-08 NOTE — Addendum Note (Signed)
Encounter addended by: Gean Maidens on: 09/08/2019 3:16 PM  Actions taken: Flowsheet accepted

## 2019-09-11 ENCOUNTER — Encounter (HOSPITAL_COMMUNITY): Payer: Medicaid Other

## 2019-09-11 NOTE — Progress Notes (Addendum)
Cardiac Individual Treatment Plan  Patient Details  Name: [name] MRN: [id number] Date of Birth: 1955-06-02 Referring Provider:     CARDIAC REHAB PHASE II ORIENTATION from 06/19/2019 in [place]  Referring Provider  [name]      Initial Encounter Date:    CARDIAC REHAB PHASE II ORIENTATION from 06/19/2019 in [place]  Date  06/19/19      Visit Diagnosis: ST elevation myocardial infarction (STEMI), unspecified artery ([place])  Status post coronary artery stent placement  Patient's Home Medications on Admission:  Current Outpatient Medications:  .  allopurinol (ZYLOPRIM) 100 MG tablet, Take 100 mg by mouth daily. , Disp: , Rfl:  .  aspirin 81 MG chewable tablet, Chew 1 tablet (81 mg total) by mouth daily., Disp: 90 tablet, Rfl: 1 .  atorvastatin (LIPITOR) 80 MG tablet, Take 1 tablet (80 mg total) by mouth daily at 6 PM., Disp: 90 tablet, Rfl: 1 .  BIDIL 20-37.5 MG tablet, Take 1 tablet by mouth twice daily (Patient taking differently: Take 1 tablet by mouth in the morning and at bedtime. ), Disp: 60 tablet, Rfl: 1 .  carvedilol (COREG) 12.5 MG tablet, TAKE 1 TABLET BY MOUTH TWICE DAILY WITH MEALS (Patient taking differently: Take 12.5 mg by mouth 2 (two) times daily with a meal. ), Disp: 180 tablet, Rfl: 3 .  ergocalciferol (VITAMIN D2) 1.25 MG (50000 UT) capsule, Take 50,000 Units by mouth every Wednesday. , Disp: , Rfl:  .  furosemide (LASIX) 40 MG tablet, TAKE 1 TABLET BY MOUTH ONCE DAILY AS NEEDED FOR FLUID (Patient taking differently: Take 40 mg by mouth daily. ), Disp: 30 tablet, Rfl: 5 .  ivabradine (CORLANOR) 5 MG TABS tablet, Take 1 tablet (5 mg total) by mouth 2 (two) times daily with a meal., Disp: 60 tablet, Rfl: 11 .  nitroGLYCERIN (NITROSTAT) 0.4 MG SL tablet, Place 1 tablet (0.4 mg total) under the tongue every 5 (five) minutes as needed., Disp: 25 tablet, Rfl: 2 .  sodium bicarbonate 650 MG tablet, Take 650 mg by  mouth 3 (three) times daily., Disp: , Rfl:  .  ticagrelor (BRILINTA) 90 MG TABS tablet, Take 1 tablet (90 mg total) by mouth 2 (two) times daily., Disp: 180 tablet, Rfl: 1  Past Medical History: Past Medical History:  Diagnosis Date  . Acute ST elevation myocardial infarction (STEMI) due to occlusion of mid portion of left anterior descending (LAD) coronary artery ([place]) 03/12/2019  . Acute systolic heart failure ([place])   . CHF (congestive heart failure) ([place])   . CKD (chronic kidney disease) stage 4, GFR 15-29 ml/min ([facility])   . Heart attack ([place]) 03/15/2019  . Hypertension 2002  . Kidney stones   . Left knee pain   . Syncope 04/04/2019    Tobacco Use: Social History   Tobacco Use  Smoking Status Former Smoker  . Packs/day: 0.50  . Years: 40.00  . Pack years: 20.00  . Types: Cigarettes  . Quit date: 03/12/2019  . Years since quitting: 0.5  Smokeless Tobacco Never Used    Labs: Recent Review Scientist, physiological    Labs for ITP Cardiac and Pulmonary Rehab Latest Ref Rng & Units 05/28/2016 03/12/2019 03/13/2019   Cholestrol 0 - 200 mg/dL 204(H) 193 -   LDLCALC 0 - 99 mg/dL 121(H) 114(H) -   HDL >40 mg/dL 48 42 -   Trlycerides <150 mg/dL 173(H) 184(H) -   Hemoglobin A1c 4.8 - 5.6 % 5.0 - 5.4  TCO2 22 - 32 mmol/L - 20(L) -      Capillary Blood Glucose: Lab Results  Component Value Date   GLUCAP 115 (H) 04/04/2019   GLUCAP 168 (H) 03/12/2019   GLUCAP 92 05/19/2016     Exercise Target Goals: Exercise Program Goal: Individual exercise prescription set using results from initial 6 min walk test and THRR while considering  patient's activity barriers and safety.   Exercise Prescription Goal: Starting with aerobic activity 30 plus minutes a day, 3 days per week for initial exercise prescription. Provide home exercise prescription and guidelines that participant acknowledges understanding prior to discharge.  Activity Barriers & Risk Stratification: Activity Barriers & Cardiac Risk  Stratification - 06/19/19 1134      Activity Barriers & Cardiac Risk Stratification   Activity Barriers  Deconditioning   (L) knee problems. He needs a replacement.   Cardiac Risk Stratification  High       6 Minute Walk: 6 Minute Walk    Row Name 06/19/19 1132         6 Minute Walk   Phase  Initial     Distance  1200 feet     Walk Time  6 minutes     # of Rest Breaks  0     MPH  2.27     METS  2.74     RPE  10     Perceived Dyspnea   11     VO2 Peak  12.31     Symptoms  No     Resting HR  83 bpm     Resting BP  90/62     Resting Oxygen Saturation   99 %     Exercise Oxygen Saturation  during 6 min walk  96 %     Max Ex. HR  114 bpm     Max Ex. BP  128/78     2 Minute Post BP  90/78        Oxygen Initial Assessment:   Oxygen Re-Evaluation:   Oxygen Discharge (Final Oxygen Re-Evaluation):   Initial Exercise Prescription: Initial Exercise Prescription - 06/19/19 1100      Date of Initial Exercise RX and Referring Provider   Date  06/19/19    Referring Provider  [name]    Expected Discharge Date  09/16/19      Treadmill   MPH  1.3    Grade  0    Minutes  17    METs  1.99      Recumbant Elliptical   Level  1    RPM  40    Watts  28    Minutes  20    METs  2.2      Prescription Details   Frequency (times per week)  3    Duration  Progress to 30 minutes of continuous aerobic without signs/symptoms of physical distress      Intensity   THRR 40-80% of Max Heartrate  [phone]    Ratings of Perceived Exertion  11-13    Perceived Dyspnea  0-4      Progression   Progression  Continue to progress workloads to maintain intensity without signs/symptoms of physical distress.      Resistance Training   Training Prescription  Yes    Weight  1    Reps  10-15       Perform Capillary Blood Glucose checks as needed.  Exercise Prescription Changes:  Exercise Prescription Changes    Row  Name 07/04/19 1500 07/05/19 1400 07/25/19 0700 08/16/19  1500 08/21/19 1400     Response to Exercise   Blood Pressure (Admit)  110/88  118/100  100/60  102/60  100/70   Blood Pressure (Exercise)  110/80  122/102  110/70  110/78  100/62   Blood Pressure (Exit)  104/84  --  94/60  106/70  118/70   Heart Rate (Admit)  90 bpm  98 bpm  84 bpm  73 bpm  95 bpm   Heart Rate (Exercise)  105 bpm  105 bpm  105 bpm  83 bpm  74 bpm   Heart Rate (Exit)  99 bpm  --  93 bpm  75 bpm  86 bpm   Rating of Perceived Exertion (Exercise)  10  --  --  11  11   Symptoms  Knee Pain 7/10  extreme SOB  Fluid retention  None  None   Comments  --  Due to his knee had to change Exercise Prescription to Arm Crank and Nustep  Due to his knee had to change Exercise Prescription to Arm Crank and Nustep  Due to his knee had to change Exercise Prescription to Arm Crank and Nustep  Patient will be out for a couple of weeks due to ICD placement   Duration  Continue with 30 min of aerobic exercise without signs/symptoms of physical distress.  Continue with 30 min of aerobic exercise without signs/symptoms of physical distress.  Continue with 30 min of aerobic exercise without signs/symptoms of physical distress.  Continue with 30 min of aerobic exercise without signs/symptoms of physical distress.  Continue with 30 min of aerobic exercise without signs/symptoms of physical distress.   Intensity  THRR unchanged  THRR unchanged  THRR unchanged  THRR unchanged  THRR unchanged     Progression   Progression  Continue to progress workloads to maintain intensity without signs/symptoms of physical distress.  Continue to progress workloads to maintain intensity without signs/symptoms of physical distress.  Continue to progress workloads to maintain intensity without signs/symptoms of physical distress.  Continue to progress workloads to maintain intensity without signs/symptoms of physical distress.  Continue to progress workloads to maintain intensity without signs/symptoms of physical distress.    Average METs  1.65  1.65  2.07  2.02  2.05     Resistance Training   Training Prescription  Yes  Yes  Yes  Yes  Yes   Weight  2  2  2  3  3    Reps  10-15  10-15  10-15  10-15  10-15     Treadmill   MPH  1.4  --  1.4  1.5  1.5   Grade  0  --  0  0  0   Minutes  17  --  17  17  17    METs  2.07  --  2.07  2.14  2.14     Arm Ergometer   Level  --  1  1.2  1.3  1.3   Watts  --  25  11  13  13    Minutes  --  17  22  22  22    METs  --  2  1.8  1.9  1.9     Recumbant Elliptical   Level  1  --  --  --  --   RPM  38  --  --  --  --   Watts  46  --  --  --  --  Minutes  22  --  --  --  --   METs  2.5  --  --  --  --     T5 Nustep   Level  --  1  --  --  --   SPM  --  50  --  --  --   Minutes  --  22  --  --  --   METs  --  2  --  --  --     Home Exercise Plan   Plans to continue exercise at  Home (comment)  Home (comment)  Home (comment)  Home (comment)  Home (comment)   Frequency  Add 2 additional days to program exercise sessions.  Add 2 additional days to program exercise sessions.  Add 2 additional days to program exercise sessions.  Add 2 additional days to program exercise sessions.  Add 2 additional days to program exercise sessions.   Initial Home Exercises Provided  06/19/19  06/19/19  06/19/19  06/19/19  06/19/19      Exercise Comments:  Exercise Comments    Row Name 06/19/19 1147 07/04/19 1531 07/25/19 0801 08/16/19 1547 08/21/19 1452   Exercise Comments  This was patient orientation/assessment visit. He is looking forward to attending to continue to get stronger and to get mobility back to 100%.  This is patients 4th visit. He is coming regularily and was progressing. Will have to watch his knee pain and if the current exercise prescription needs to be ajusted to elivate his current knee pain and swelling.  We will continue to progress his workload as tolerated.  Patient is scheduled to have a pacemaker implanted on 08/24/19. He will get his COVID-19 test on 4/19 and then  will not return to [facility] until to released by his cardiologist.  Patient will not be able to be progressed for about 2 weeks or so due to having ICD placement surgery on 08/21/19. Once he returns we will continue to progress him as tolerated.   [place] Name 09/08/19 1514           Exercise Comments  Patient has been out since 08/18/19 due to having pace maker placement surgery. Patient came in to inform us that he will return after his kidney stone surgery. He plans to return on 09/25/19. We will progress as tolerated when he returns.          Exercise Goals and Review:  Exercise Goals    Row Name 06/19/19 1146             Exercise Goals   Increase Physical Activity  Yes       Intervention  Provide advice, education, support and counseling about physical activity/exercise needs.;Develop an individualized exercise prescription for aerobic and resistive training based on initial evaluation findings, risk stratification, comorbidities and participant's personal goals.       Expected Outcomes  Long Term: Add in home exercise to make exercise part of routine and to increase amount of physical activity.;Short Term: Attend rehab on a regular basis to increase amount of physical activity.       Increase Strength and Stamina  Yes       Intervention  Provide advice, education, support and counseling about physical activity/exercise needs.;Develop an individualized exercise prescription for aerobic and resistive training based on initial evaluation findings, risk stratification, comorbidities and participant's personal goals.       Expected Outcomes  Short Term: Perform resistance training exercises routinely during rehab and add in  resistance training at home;Long Term: Improve cardiorespiratory fitness, muscular endurance and strength as measured by increased METs and functional capacity (6MWT)       Able to understand and use rate of perceived exertion (RPE) scale  Yes       Intervention  Provide education  and explanation on how to use RPE scale       Expected Outcomes  Short Term: Able to use RPE daily in rehab to express subjective intensity level;Long Term:  Able to use RPE to guide intensity level when exercising independently       Able to understand and use Dyspnea scale  Yes       Intervention  Provide education and explanation on how to use Dyspnea scale       Expected Outcomes  Short Term: Able to use Dyspnea scale daily in rehab to express subjective sense of shortness of breath during exertion;Long Term: Able to use Dyspnea scale to guide intensity level when exercising independently       Knowledge and understanding of Target Heart Rate Range (THRR)  Yes       Intervention  Provide education and explanation of THRR including how the numbers were predicted and where they are located for reference       Expected Outcomes  Short Term: Able to use daily as guideline for intensity in rehab;Long Term: Able to use THRR to govern intensity when exercising independently       Able to check pulse independently  Yes       Intervention  Provide education and demonstration on how to check pulse in carotid and radial arteries.;Review the importance of being able to check your own pulse for safety during independent exercise       Expected Outcomes  Short Term: Able to explain why pulse checking is important during independent exercise;Long Term: Able to check pulse independently and accurately       Understanding of Exercise Prescription  Yes       Intervention  Provide education, explanation, and written materials on patient's individual exercise prescription       Expected Outcomes  Short Term: Able to explain program exercise prescription;Long Term: Able to explain home exercise prescription to exercise independently          Exercise Goals Re-Evaluation : Exercise Goals Re-Evaluation    Row Name 07/04/19 1527 07/25/19 0758 08/16/19 1546 08/21/19 1449       Exercise Goal Re-Evaluation    Exercise Goals Review  Increase Physical Activity;Increase Strength and Stamina  --  Increase Physical Activity;Increase Strength and Stamina  Knowledge and understanding of Target Heart Rate Range (THRR)    Comments  On patients last visit he c/o knee pain and swelling 7/10. We may have to switch the equipment that he is using if the TM and Ellipical continue to aggrivate his knee. Will cintinue to assess.  This is patients 9th visit in the program. He has been retaining fluid on a regular basis. He has had 2 trips to the ER since starting the program due to fluid retenion. He has been instructed on ways in which to change his diet to help to keep his fluid down. He has expressed an understanding.  Progress as tolerated  Patient goals are to get mobility back 100% and to keep it. Due to his poor attendance he may not reach these goals. We will continue to encourage him to come on a more regular basis so that he can improve his  mobility.    Expected Outcomes  To be able to progress as tolerated.  To keep his fluid at a healthful level so that he does not experienced SOB.  Get get mobility back to 100%  To improve his mobility.        Discharge Exercise Prescription (Final Exercise Prescription Changes): Exercise Prescription Changes - 08/21/19 1400      Response to Exercise   Blood Pressure (Admit)  100/70    Blood Pressure (Exercise)  100/62    Blood Pressure (Exit)  118/70    Heart Rate (Admit)  95 bpm    Heart Rate (Exercise)  74 bpm    Heart Rate (Exit)  86 bpm    Rating of Perceived Exertion (Exercise)  11    Symptoms  None    Comments  Patient will be out for a couple of weeks due to ICD placement    Duration  Continue with 30 min of aerobic exercise without signs/symptoms of physical distress.    Intensity  THRR unchanged      Progression   Progression  Continue to progress workloads to maintain intensity without signs/symptoms of physical distress.    Average METs  2.05       Resistance Training   Training Prescription  Yes    Weight  3    Reps  10-15      Treadmill   MPH  1.5    Grade  0    Minutes  17    METs  2.14      Arm Ergometer   Level  1.3    Watts  13    Minutes  22    METs  1.9      Home Exercise Plan   Plans to continue exercise at  Home (comment)    Frequency  Add 2 additional days to program exercise sessions.    Initial Home Exercises Provided  06/19/19       Nutrition:  Target Goals: Understanding of nutrition guidelines, daily intake of sodium 1500mg , cholesterol 200mg , calories 30% from fat and 7% or less from saturated fats, daily to have 5 or more servings of fruits and vegetables.  Biometrics: Pre Biometrics - 06/19/19 1148      Pre Biometrics   Height  6' (1.829 m)    Weight  86.5 kg    Waist Circumference  40.5 inches    Hip Circumference  38 inches    Waist to Hip Ratio  1.07 %    BMI (Calculated)  25.86    Triceps Skinfold  6 mm    % Body Fat  22.9 %    Grip Strength  37.9 kg    Flexibility  0 in    Single Leg Stand  15.37 seconds        Nutrition Therapy Plan and Nutrition Goals: Nutrition Therapy & Goals - 08/21/19 1507      Personal Nutrition Goals   Comments  We are currently working on scheduling patients to meet with RD. Patient is supposed to be following a low Na diet and says he is trying but admitts to not always complying. He says he is working on portion sizes and trying to "eat less of everything".  Will encourage to meet with RD and continue to monitor for progress.      Intervention Plan   Intervention  Nutrition handout(s) given to patient.       Nutrition Assessments: Nutrition Assessments - 06/19/19 1151  MEDFICTS Scores   Pre Score  3       Nutrition Goals Re-Evaluation:   Nutrition Goals Discharge (Final Nutrition Goals Re-Evaluation):   Psychosocial: Target Goals: Acknowledge presence or absence of significant depression and/or stress, maximize coping skills,  provide positive support system. Participant is able to verbalize types and ability to use techniques and skills needed for reducing stress and depression.  Initial Review & Psychosocial Screening: Initial Psych Review & Screening - 06/19/19 1150      Initial Review   Current issues with  None Identified      Family Dynamics   Good Support System?  Yes      Barriers   Psychosocial barriers to participate in program  There are no identifiable barriers or psychosocial needs.      Screening Interventions   Interventions  Encouraged to exercise       Quality of Life Scores: Quality of Life - 06/19/19 1026      Quality of Life   Select  Quality of Life      Quality of Life Scores   Health/Function Pre  23.67 %    Socioeconomic Pre  25 %    Psych/Spiritual Pre  27.92 %    Family Pre  21.75 %    GLOBAL Pre  24.54 %      Scores of 19 and below usually indicate a poorer quality of life in these areas.  A difference of  2-3 points is a clinically meaningful difference.  A difference of 2-3 points in the total score of the Quality of Life Index has been associated with significant improvement in overall quality of life, self-image, physical symptoms, and general health in studies assessing change in quality of life.  PHQ-9: Recent Review Flowsheet Data    Depression screen [facility] 2/9 07/27/2019 06/19/2019 04/11/2019 03/21/2019   Decreased Interest 0 0 0 0   Down, Depressed, Hopeless 0 0 0 0   PHQ - 2 Score 0 0 0 0   Altered sleeping - 0 - -   Tired, decreased energy - 0 - -   Change in appetite - 0 - -   Feeling bad or failure about yourself  - 0 - -   Trouble concentrating - 0 - -   Moving slowly or fidgety/restless - 0 - -   Suicidal thoughts - 0 - -   PHQ-9 Score - 0 - -     Interpretation of Total Score  Total Score Depression Severity:  1-4 = Minimal depression, 5-9 = Mild depression, 10-14 = Moderate depression, 15-19 = Moderately severe depression, 20-27 = Severe depression    Psychosocial Evaluation and Intervention:   Psychosocial Re-Evaluation: Psychosocial Re-Evaluation    Row Name 07/05/19 1026 07/26/19 1608 08/21/19 1508         Psychosocial Re-Evaluation   Current issues with  None Identified  None Identified  None Identified     Comments  Patient's initial QOL score ws 24.54 and his PHQ-9 score was 0 with no psychosocial issued identified.  Patient's initial QOL score ws 24.54 and his PHQ-9 score was 0 with no psychosocial issued identified.  Patient's initial QOL score ws 24.54 and his PHQ-9 score was 0 with no psychosocial issued identified.     Expected Outcomes  Patient will have no psychosocial issued identified at discharge.  Patient will have no psychosocial issued identified at discharge.  Patient will have no psychosocial issued identified at discharge.     Interventions  Stress management education;Encouraged to attend Cardiac Rehabilitation for the exercise;Relaxation education  Stress management education;Encouraged to attend Cardiac Rehabilitation for the exercise;Relaxation education  Stress management education;Encouraged to attend Cardiac Rehabilitation for the exercise;Relaxation education     Continue Psychosocial Services   No Follow up required  No Follow up required  No Follow up required        Psychosocial Discharge (Final Psychosocial Re-Evaluation): Psychosocial Re-Evaluation - 08/21/19 1508      Psychosocial Re-Evaluation   Current issues with  None Identified    Comments  Patient's initial QOL score ws 24.54 and his PHQ-9 score was 0 with no psychosocial issued identified.    Expected Outcomes  Patient will have no psychosocial issued identified at discharge.    Interventions  Stress management education;Encouraged to attend Cardiac Rehabilitation for the exercise;Relaxation education    Continue Psychosocial Services   No Follow up required       Vocational Rehabilitation: Provide vocational rehab assistance to  qualifying candidates.   Vocational Rehab Evaluation & Intervention: Vocational Rehab - 06/19/19 1151      Initial Vocational Rehab Evaluation & Intervention   Assessment shows need for Vocational Rehabilitation  No       Education: Education Goals: Education classes will be provided on a weekly basis, covering required topics. Participant will state understanding/return demonstration of topics presented.  Learning Barriers/Preferences: Learning Barriers/Preferences - 06/19/19 1151      Learning Barriers/Preferences   Learning Barriers  None    Learning Preferences  Individual Instruction;Group Instruction;Skilled Demonstration       Education Topics: Hypertension, Hypertension Reduction -Define heart disease and high blood pressure. Discus how high blood pressure affects the body and ways to reduce high blood pressure.   Exercise and Your Heart -Discuss why it is important to exercise, the FITT principles of exercise, normal and abnormal responses to exercise, and how to exercise safely.   CARDIAC REHAB PHASE II EXERCISE from 08/16/2019 in [place]  Date  08/16/19  Educator  DC  Instruction Review Code  2- Demonstrated Understanding      Angina -Discuss definition of angina, causes of angina, treatment of angina, and how to decrease risk of having angina.   Cardiac Medications -Review what the following cardiac medications are used for, how they affect the body, and side effects that may occur when taking the medications.  Medications include Aspirin, Beta blockers, calcium channel blockers, ACE Inhibitors, angiotensin receptor blockers, diuretics, digoxin, and antihyperlipidemics.   Congestive Heart Failure -Discuss the definition of CHF, how to live with CHF, the signs and symptoms of CHF, and how keep track of weight and sodium intake.   Heart Disease and Intimacy -Discus the effect sexual activity has on the heart, how changes occur during  intimacy as we age, and safety during sexual activity.   Smoking Cessation / COPD -Discuss different methods to quit smoking, the health benefits of quitting smoking, and the definition of COPD.   Nutrition I: Fats -Discuss the types of cholesterol, what cholesterol does to the heart, and how cholesterol levels can be controlled.   Nutrition II: Labels -Discuss the different components of food labels and how to read food label   CARDIAC REHAB PHASE II EXERCISE from 08/16/2019 in [place]  Date  07/05/19  Educator  Etheleen Mayhew  Instruction Review Code  2- Demonstrated Understanding      Heart Parts/Heart Disease and PAD -Discuss the anatomy of the heart, the pathway of blood  circulation through the heart, and these are affected by heart disease.   Stress I: Signs and Symptoms -Discuss the causes of stress, how stress may lead to anxiety and depression, and ways to limit stress.   CARDIAC REHAB PHASE II EXERCISE from 08/16/2019 in [place]  Date  07/19/19  Educator  Etheleen Mayhew  Instruction Review Code  2- Demonstrated Understanding      Stress II: Relaxation -Discuss different types of relaxation techniques to limit stress.   Warning Signs of Stroke / TIA -Discuss definition of a stroke, what the signs and symptoms are of a stroke, and how to identify when someone is having stroke.   Knowledge Questionnaire Score: Knowledge Questionnaire Score - 06/19/19 1151      Knowledge Questionnaire Score   Pre Score  18/24       Core Components/Risk Factors/Patient Goals at Admission: Personal Goals and Risk Factors at Admission - 06/19/19 1151      Core Components/Risk Factors/Patient Goals on Admission    Weight Management  Weight Maintenance    Personal Goal Other  Yes    Personal Goal  Patient wants to get mobility back to 100% and to be able to maintain that level of strength.    Intervention  Atten CR 3 x week and to  supplement with walking 2 x week at home.    Expected Outcomes  Reach expected outcomes       Core Components/Risk Factors/Patient Goals Review:  Goals and Risk Factor Review    Row Name 07/05/19 1021 07/26/19 1601 08/21/19 1501 09/11/19 1501       Core Components/Risk Factors/Patient Goals Review   Personal Goals Review  Weight Management/Obesity;Other Get mobility back 100%; keep mobility.  Weight Management/Obesity;Other Get mobility back to 100%. Keep his mobility.  Weight Management/Obesity;Other Get mobility back 100%; keep his mobility.  Weight Management/Obesity;Other Get mobility back 100%; keep his mobility.    Review  Patient has completed 5 sessions gaining 5 lbs since his initial visit. He is new to the program. He complained of SOB today during his session. He has an echocardiogram completed today. Dr. Letha Cape office was notified and patient was advised to go to the ED. Will continue to monitor patient's progress.  Patient has completed 9 sessions with fluctuating weight. He was not allowed to complete his session 07/21/19 due to a 10 lb weight gain in one week and noted and reported SOB. Patient was advised to call the HF clinic when he got home and report his weight gain as he had been instructed. He choose to go to the ED due to his increased SOB after he left CR. He was evaluated and treated with IV Furosemide with improvement in symptoms. Patient is to f/u in HF clinic tomorrow 3/25. He admitts to adding salt to his food over the weekend and has been instructed on a low salt diet. He plans to return to the program 07/31/19. Will continue to monitor for progress toward meeting his goals once he returns.  Patient has completed 15 sessions with continued fluctuating weight. Patient's attendance has been inconsistent due to doctor's appointments and his 2 ED visits which required follow up MD appointments before he could return which is a barrier to his progress in the program. He is  scheduled to have an ICD implanted 08/24/19 and will be out until MD clearance to return. Will continue to monitor for progress.  Patient has not attended since last 30 day review due to  having an ICD implanted 08/24/19. He has been released by his MD to return but he is having a renal stone removed in late May. He plans to return 09/18/19. He plans to have a TKR 11/01/19. We informed him that he will be discharged near the end of June. He verbalized understanding and is okay with this plan. Will continue to monitor for progress.    Expected Outcomes  Patient will continue to attend sessions and complete the program meeting his personal goals.  Patient will continue to attend sessions and complete the program meeting his personal goals.  Patient will continue to attend sessions and complete the program meeting his personal goals.  Patient will continue to attend sessions and complete the program meeting his personal goals.       Core Components/Risk Factors/Patient Goals at Discharge (Final Review):  Goals and Risk Factor Review - 09/11/19 1501      Core Components/Risk Factors/Patient Goals Review   Personal Goals Review  Weight Management/Obesity;Other   Get mobility back 100%; keep his mobility.   Review  Patient has not attended since last 30 day review due to having an ICD implanted 08/24/19. He has been released by his MD to return but he is having a renal stone removed in late May. He plans to return 09/18/19. He plans to have a TKR 11/01/19. We informed him that he will be discharged near the end of June. He verbalized understanding and is okay with this plan. Will continue to monitor for progress.    Expected Outcomes  Patient will continue to attend sessions and complete the program meeting his personal goals.       ITP Comments: ITP Comments    Row Name 06/19/19 1131 07/14/19 0909 08/07/19 1415       ITP Comments  Patient is eager to get started. He is coming by way of [facility].  Patient was  evaluated in the ED 07/05/19 for SOB and diagnosed with acute on chronic CHF. He was instructed to f/u with the HF clinic which was 3/10. They increased his Lasix to 40 mg daily. Will continue to monitor.  Patient seen in HF clinic 08/01/19 as ED follow-up. They added Ivabradine 5 mg BID for tachycardia and Prednisone 40 mg for 3 days for gout pain in his ankles. They also advised him on his diet to avoid all salt and fried foods and eat more fruits and veggies. Will continue to monitor for progress.        Comments: ITP REVIEW Pt is making expected progress toward Cardiac Rehab goals after completing 15 sessions. Recommend continued exercise, life style modification, education, and increased stamina and strength.

## 2019-09-11 NOTE — Addendum Note (Signed)
Encounter addended by: Dwana Melena, RN on: 09/11/2019 3:12 PM  Actions taken: Flowsheet data copied forward, Flowsheet accepted, Pend clinical note

## 2019-09-12 ENCOUNTER — Other Ambulatory Visit: Payer: Self-pay | Admitting: Family Medicine

## 2019-09-13 ENCOUNTER — Encounter (HOSPITAL_COMMUNITY): Payer: Medicaid Other

## 2019-09-14 ENCOUNTER — Other Ambulatory Visit: Payer: Self-pay | Admitting: Family Medicine

## 2019-09-14 NOTE — Addendum Note (Signed)
Encounter addended by: Gean Maidens on: 09/14/2019 7:07 AM  Actions taken: Flowsheet accepted

## 2019-09-14 NOTE — Addendum Note (Signed)
Encounter addended by: Dwana Melena, RN on: 09/14/2019 8:19 AM  Actions taken: Clinical Note Signed

## 2019-09-15 ENCOUNTER — Encounter (HOSPITAL_COMMUNITY): Payer: Medicaid Other

## 2019-09-15 ENCOUNTER — Other Ambulatory Visit: Payer: Self-pay | Admitting: Family Medicine

## 2019-09-15 ENCOUNTER — Other Ambulatory Visit: Payer: Self-pay | Admitting: *Deleted

## 2019-09-15 MED ORDER — ATORVASTATIN CALCIUM 80 MG PO TABS: 80.0000 mg | ORAL_TABLET | Freq: Every day | ORAL | 1 refills | Status: DC

## 2019-09-18 ENCOUNTER — Encounter (HOSPITAL_COMMUNITY): Payer: Self-pay

## 2019-09-18 NOTE — Patient Instructions (Addendum)
DUE TO COVID-19 ONLY ONE VISITOR ARE ALLOWED TO COME WITH YOU AND STAY IN THE WAITING ROOM ONLY DURING PRE OP AND PROCEDURE. THEN TWO VISITORS MAY VISIT WITH YOU IN YOUR PRIVATE ROOM DURING VISITING HOURS ONLY!!   COVID SWAB TESTING MUST BE COMPLETED ON:  Friday, Sep 22, 2019 at 9:30AM [address], [facility] testing line (Must self quarantine after testing. Follow instructions on handout.)             Your procedure is scheduled on: Tuesday, Sep 26, 2019   Report to [facility] Main  Entrance    Report to admitting at 8:45 AM   Call this number if you have problems the morning of surgery [phone]   Do not eat food or drink liquids :After Midnight.   Oral Hygiene is also important to reduce your risk of infection.                                    Remember - BRUSH YOUR TEETH THE MORNING OF SURGERY WITH YOUR REGULAR TOOTHPASTE   Do NOT smoke after Midnight   Take these medicines the morning of surgery with A SIP OF WATER: Allopurinol, Bidil, Carvedilol, Corlanor                               You may not have any metal on your body including jewelry, and body piercings             Do not wear lotions, powders, perfumes/cologne, or deodorant                         Men may shave face and neck.   Do not bring valuables to the hospital. [place].   Contacts, dentures or bridgework may not be worn into surgery.   Bring small overnight bag day of surgery.    Patients discharged the day of surgery will not be allowed to drive home.   Special Instructions: Bring a copy of your healthcare power of attorney and living will documents         the day of surgery if you haven't scanned them in before.              Please read over the following fact sheets you were given: IF YOU HAVE QUESTIONS ABOUT YOUR PRE OP INSTRUCTIONS PLEASE CALL [phone]   [place] -  Preparing for Surgery Before surgery, you can play an important role.  Because skin is not sterile, your skin needs to be as free of germs as possible.  You can reduce the number of germs on your skin by washing with CHG (chlorahexidine gluconate) soap before surgery.  CHG is an antiseptic cleaner which kills germs and bonds with the skin to continue killing germs even after washing. Please DO NOT use if you have an allergy to CHG or antibacterial soaps.  If your skin becomes reddened/irritated stop using the CHG and inform your nurse when you arrive at Short Stay. Do not shave (including legs and underarms) for at least 48 hours prior to the first CHG shower.  You may shave your face/neck.  Please follow these instructions carefully:  1.  Shower with CHG Soap the night before surgery and the  morning of surgery.  2.  If you choose to wash your hair, wash your hair first as usual with your normal  shampoo.  3.  After you shampoo, rinse your hair and body thoroughly to remove the shampoo.                             4.  Use CHG as you would any other liquid soap.  You can apply chg directly to the skin and wash.  Gently with a scrungie or clean washcloth.  5.  Apply the CHG Soap to your body ONLY FROM THE NECK DOWN.   Do   not use on face/ open                           Wound or open sores. Avoid contact with eyes, ears mouth and   genitals (private parts).                       Wash face,  Genitals (private parts) with your normal soap.             6.  Wash thoroughly, paying special attention to the area where your    surgery  will be performed.  7.  Thoroughly rinse your body with warm water from the neck down.  8.  DO NOT shower/wash with your normal soap after using and rinsing off the CHG Soap.                9.  Pat yourself dry with a clean towel.            10.  Wear clean pajamas.            11.  Place clean sheets on your bed the night of your first shower and do not  sleep with  pets. Day of Surgery : Do not apply any lotions/deodorants the morning of surgery.  Please wear clean clothes to the hospital/surgery center.  FAILURE TO FOLLOW THESE INSTRUCTIONS MAY RESULT IN THE CANCELLATION OF YOUR SURGERY  PATIENT SIGNATURE_________________________________  NURSE SIGNATURE__________________________________  ________________________________________________________________________  WHAT IS A BLOOD TRANSFUSION? Blood Transfusion Information  A transfusion is the replacement of blood or some of its parts. Blood is made up of multiple cells which provide different functions.  Red blood cells carry oxygen and are used for blood loss replacement.  White blood cells fight against infection.  Platelets control bleeding.  Plasma helps clot blood.  Other blood products are available for specialized needs, such as hemophilia or other clotting disorders. BEFORE THE TRANSFUSION  Who gives blood for transfusions?   Healthy volunteers who are fully evaluated to make sure their blood is safe. This is blood bank blood. Transfusion therapy is the safest it has ever been in the practice of medicine. Before blood is taken from a donor, a complete history is taken to make sure that person has no history of diseases nor engages in risky social behavior (examples are intravenous drug use or sexual activity with multiple partners). The donor's travel history is screened to minimize risk of transmitting infections, such as malaria. The donated blood is tested for signs of infectious diseases, such as HIV and hepatitis. The blood is then tested to be sure it is compatible with you in order to minimize the chance  of a transfusion reaction. If you or a relative donates blood, this is often done in anticipation of surgery and is not appropriate for emergency situations. It takes many days to process the donated blood. RISKS AND COMPLICATIONS Although transfusion therapy is very safe and saves  many lives, the main dangers of transfusion include:   Getting an infectious disease.  Developing a transfusion reaction. This is an allergic reaction to something in the blood you were given. Every precaution is taken to prevent this. The decision to have a blood transfusion has been considered carefully by your caregiver before blood is given. Blood is not given unless the benefits outweigh the risks. AFTER THE TRANSFUSION  Right after receiving a blood transfusion, you will usually feel much better and more energetic. This is especially true if your red blood cells have gotten low (anemic). The transfusion raises the level of the red blood cells which carry oxygen, and this usually causes an energy increase.  The nurse administering the transfusion will monitor you carefully for complications. HOME CARE INSTRUCTIONS  No special instructions are needed after a transfusion. You may find your energy is better. Speak with your caregiver about any limitations on activity for underlying diseases you may have. SEEK MEDICAL CARE IF:   Your condition is not improving after your transfusion.  You develop redness or irritation at the intravenous (IV) site. SEEK IMMEDIATE MEDICAL CARE IF:  Any of the following symptoms occur over the next 12 hours:  Shaking chills.  You have a temperature by mouth above 102 F (38.9 C), not controlled by medicine.  Chest, back, or muscle pain.  People around you feel you are not acting correctly or are confused.  Shortness of breath or difficulty breathing.  Dizziness and fainting.  You get a rash or develop hives.  You have a decrease in urine output.  Your urine turns a dark color or changes to pink, red, or brown. Any of the following symptoms occur over the next 10 days:  You have a temperature by mouth above 102 F (38.9 C), not controlled by medicine.  Shortness of breath.  Weakness after normal activity.  The white part of the eye turns  yellow (jaundice).  You have a decrease in the amount of urine or are urinating less often.  Your urine turns a dark color or changes to pink, red, or brown. Document Released: 04/17/2000 Document Revised: 07/13/2011 Document Reviewed: 12/05/2007 [facility] Patient Information 2014 [place], [place].  _______________________________________________________________________

## 2019-09-19 ENCOUNTER — Encounter: Payer: Self-pay | Admitting: Urology

## 2019-09-19 NOTE — Progress Notes (Signed)
PERIOPERATIVE PRESCRIPTION FOR IMPLANTED CARDIAC DEVICE PROGRAMMING  Patient Information: Name:  [name]  DOB:  1956-04-10  MRN:  [id number]    Planned Procedure: Left percutaneous nephrolithotomy  Surgeon: Dr. Jeffie Pollock  Date of Procedure: 09/26/2019  Cautery will be used.  Position during surgery:    Please send documentation back to:  [facility] (Fax # [phone])   Device Information:  Clinic EP Physician:  [name], MD   Device Type:  Defibrillator Manufacturer and Phone #:  [employer]: [phone] Pacemaker Dependent?:  NO Date of Last Device Check:  09/05/19 Normal Device Function?:  Yes.    Electrophysiologist's Recommendations:   Have magnet available.  Provide continuous ECG monitoring when magnet is used or reprogramming is to be performed.   Procedure may interfere with device function.  Magnet should be placed over device during procedure.  Per Device Clinic Standing Orders, Simone Curia, RN  9:10 AM 09/19/2019

## 2019-09-20 ENCOUNTER — Encounter: Payer: Self-pay | Admitting: Internal Medicine

## 2019-09-20 ENCOUNTER — Other Ambulatory Visit (HOSPITAL_COMMUNITY): Payer: Medicare Other

## 2019-09-20 ENCOUNTER — Encounter (HOSPITAL_COMMUNITY)
Admission: RE | Admit: 2019-09-20 | Discharge: 2019-09-20 | Disposition: A | Discharge status: Home / Self Care | Payer: Medicare HMO | Source: Ambulatory Visit | Attending: Urology | Admitting: Urology

## 2019-09-20 ENCOUNTER — Other Ambulatory Visit: Payer: Self-pay

## 2019-09-20 ENCOUNTER — Encounter (HOSPITAL_COMMUNITY): Payer: Self-pay

## 2019-09-20 DIAGNOSIS — Z01812 Encounter for preprocedural laboratory examination: Secondary | ICD-10-CM | POA: Insufficient documentation

## 2019-09-20 HISTORY — DX: Unspecified osteoarthritis, unspecified site: M19.90

## 2019-09-20 HISTORY — DX: Dyspnea, unspecified: R06.00

## 2019-09-20 HISTORY — DX: Hyperparathyroidism, unspecified: E21.3

## 2019-09-20 HISTORY — DX: Vitamin D deficiency, unspecified: E55.9

## 2019-09-20 HISTORY — DX: Personal history of urinary calculi: Z87.442

## 2019-09-20 HISTORY — DX: Ischemic cardiomyopathy: I25.5

## 2019-09-20 LAB — CBC
HCT: 33.8 % — ABNORMAL LOW (ref 39.0–52.0)
Hemoglobin: 11 g/dL — ABNORMAL LOW (ref 13.0–17.0)
MCH: 32.3 pg (ref 26.0–34.0)
MCHC: 32.5 g/dL (ref 30.0–36.0)
MCV: 99.1 fL (ref 80.0–100.0)
Platelets: 170 10*3/uL (ref 150–400)
RBC: 3.41 MIL/uL — ABNORMAL LOW (ref 4.22–5.81)
RDW: 13.6 % (ref 11.5–15.5)
WBC: 6 10*3/uL (ref 4.0–10.5)
nRBC: 0 % (ref 0.0–0.2)

## 2019-09-20 LAB — BASIC METABOLIC PANEL
Anion gap: 8 (ref 5–15)
BUN: 30 mg/dL — ABNORMAL HIGH (ref 8–23)
CO2: 25 mmol/L (ref 22–32)
Calcium: 10.4 mg/dL — ABNORMAL HIGH (ref 8.9–10.3)
Chloride: 108 mmol/L (ref 98–111)
Creatinine, Ser: 3.22 mg/dL — ABNORMAL HIGH (ref 0.61–1.24)
GFR calc Af Amer: 23 mL/min — ABNORMAL LOW (ref >60)
GFR calc non Af Amer: 20 mL/min — ABNORMAL LOW (ref >60)
Glucose, Bld: 113 mg/dL — ABNORMAL HIGH (ref 70–99)
Potassium: 4.2 mmol/L (ref 3.5–5.1)
Sodium: 141 mmol/L (ref 135–145)

## 2019-09-20 LAB — ABO/RH: ABO/RH(D): A POS

## 2019-09-20 NOTE — Progress Notes (Addendum)
PCP - Cherly Beach NP Cardiologist - Dr. Beckie Salts last office visit 08/15/19 Nephrologist-Dr. Bryson Dames last office visit 08/09/19  Chest x-ray - 08/24/19 in epic EKG - 08/24/19 in epic Stress Test - N/A ECHO - 08/01/19 in epic Cardiac Cath - 03/12/19 in epic  Sleep Study - N/A CPAP - N/A  Fasting Blood Sugar - N/A Checks Blood Sugar _N/A____ times a day  Blood Thinner Instructions: Brilinta last dose 09/21/19 Aspirin Instructions: Is continuing Aspirin per patient Last Dose:  Anesthesia review: ICD placed 08/24/19, CHF, MI, HTN, CKDIV, In ER 07/2019 for SOB, has DOE periodically voiced by patient, none at the moment  Patient denies shortness of breath, fever, cough and chest pain at PAT appointment   Patient verbalized understanding of instructions that were given to them at the PAT appointment. Patient was also instructed that they will need to review over the PAT instructions again at home before surgery.

## 2019-09-20 NOTE — Progress Notes (Signed)
PERIOPERATIVE PRESCRIPTION FOR IMPLANTED CARDIAC DEVICE PROGRAMMING  Patient Information: Name:  [name]  DOB:  [birth year]  MRN:  [id number]  Planned Procedure: Left percutaneous nephrolithotomy  Surgeon: Dr. Jeffie Pollock  Date of Procedure: 09/26/19  Cautery will be used.  Position during surgery:    Please send documentation back to:  [facility] (Fax # [phone])   Device Information:  Clinic EP Physician:  [name], MD  Device Type:  Defibrillator Manufacturer and Phone #:  [employer]: [phone] Pacemaker Dependent?:  No. Date of Last Device Check:  09/05/2019 Normal Device Function?:  Yes.    Electrophysiologist's Recommendations:   Have magnet available.  Provide continuous ECG monitoring when magnet is used or reprogramming is to be performed.   Procedure may interfere with device function.  Magnet should be placed over device during procedure.  Per Device Clinic [address], [place], [place]  3:01 PM 09/20/2019

## 2019-09-21 ENCOUNTER — Telehealth: Payer: Self-pay | Admitting: Internal Medicine

## 2019-09-21 NOTE — Telephone Encounter (Signed)
New Message         [facility] Pre-operative Risk Assessment    HEARTCARE STAFF: - Please ensure there is not already an duplicate clearance open for this procedure - Under Visit Info/Reason for Call, type in Other and utilize the format Clearance MM/DD/YY or Clearance TBD  Request for surgical clearance:  1. What type of surgery is being performed? Kidney stone surgery   2. When is this surgery scheduled? 09/26/2019 AND 09/28/19   3. What type of clearance is required (medical clearance vs. Pharmacy clearance to hold med vs. Both)? Both   4. Are there any medications that need to be held prior to surgery and how long? Brilinta - Dr Discretion   5. Practice name and name of physician performing surgery? [facility], Dr Jeffie Pollock   6. What is the office phone number? [phone]   7.   What is the office fax number? [phone]  8.   Anesthesia type (None, local, MAC, general) ? General    [name] 09/21/2019, 4:13 PM  _________________________________________________________________   (provider comments below)

## 2019-09-21 NOTE — Progress Notes (Signed)
Anesthesia Chart Review   Case: [id number] Date/Time: 09/26/19 1030   Procedure: LEFT NEPHROLITHOTOMY PERCUTANEOUS (Left )   Anesthesia type: General   Pre-op diagnosis: LEFT STAGHORN STONE   Location: [place] [facility]   Surgeons: Irine Seal, MD       DISCUSSION:64 y.o. former smoker (20 pack years, quit 03/12/2019) with h/o HTN, CHF (EF 20-25% on Echo 07/05/2019), AICD placed 08/24/19 (device orders on chart), CKD Stage IV, left staghorn stone scheduled for above procedure 09/26/19 with Dr. Irine Seal.   Cardiac clearance requested.  Discussed with anesthesia, Dr. Fransisco Beau, who agrees cardiac clearance needed.  VS: Ht 6' (1.829 m)   Wt 89.8 kg   BMI 26.85 kg/m   PROVIDERS: Perlie Mayo, NP is PCP   [name], MD is Cardiologist   Manpreet, MD is Nephrologist  LABS: Labs reviewed: Acceptable for surgery. (all labs ordered are listed, but only abnormal results are displayed)  Labs Reviewed  ABO/RH     IMAGES:   EKG: 08/24/2019 Rate 70 bpm  Normal sinus rhythm Left axis deviation Minimal voltage criteria for LVH, may be normal variant ( Cornell product ) Inferior infarct , age undetermined Cannot rule out Anteroseptal infarct , age undetermined T wave abnormality, consider lateral ischemia Abnormal ECG No significant change since last tracing  CV: Echo 07/05/2019 IMPRESSIONS     1. Left ventricular ejection fraction, by estimation, is 20 to 25%. The  left ventricle has severely decreased function. The left ventricle  demonstrates regional wall motion abnormalities (see scoring  diagram/findings for description). The left  ventricular internal cavity size was mildly dilated. Left ventricular  diastolic parameters are indeterminate.   2. Right ventricular systolic function is normal. The right ventricular  size is normal. There is normal pulmonary artery systolic pressure.   3. Left atrial size was severely dilated.   4. The mitral valve is normal in  structure and function. Mild mitral  valve regurgitation. No evidence of mitral stenosis.   5. The aortic valve is tricuspid. Aortic valve regurgitation is not  visualized. No aortic stenosis is present.   6. The inferior vena cava is normal in size with greater than 50%  respiratory variability, suggesting right atrial pressure of 3 mmHg.   Cardiac Cath 03/12/2019 Total occlusion of the mid LAD treated with angioplasty and stenting using a 4.0 x 22 Onyx deployed at 14 atm.  0% stenosis was noted post procedure and TIMI grade III flow increased from 0 to 3.  The final angiographic images of the LAD demonstrates apical occlusion due to embolus. Luminal irregularities in the left main but no agreeable stenosis. Large circumflex with 3 obtuse marginals.  The first marginal is large and has a very distal 90% stenosis in 2 branches.  There is a trifurcation distally in the first marginal. Moderate diffuse luminal irregularities but widely patent. Mid anterior wall to apical akinesis.  EF 30 to 35% acutely.  LVEDP 31 mmHg.   RECOMMENDATIONS:   High risk for developing acute kidney injury on top of chronic kidney disease stage IV.  4 hours of hydration of 50 cc/h is ordered.  Monitor kidney function daily We will need nephrology consultation. At high risk for developing hypoxia and pulmonary congestion.  If fever, IV Lasix should be instituted. IV nitroglycerin between 20 and 60 mcg/min for a total of 24 hours at which time the patient should be switched to BiDil 37.5/25 mg 1 tablet twice daily and titrate up.  He  is not a candidate for angiotensin receptor blockade/ACE inhibitor therapy due to CKD. Start carvedilol 3.125 mg p.o. twice daily in a.m. High risk for complications. Past Medical History:  Diagnosis Date   Acute ST elevation myocardial infarction (STEMI) due to occlusion of mid portion of left anterior descending (LAD) coronary artery ([place]) 34/05/9620   Acute systolic heart failure (HCC)     Arthritis    CHF (congestive heart failure) (HCC)    CKD (chronic kidney disease) stage 4, GFR 15-29 ml/min (HCC)    Dyspnea    with exertionm periodically   Heart attack ([place]) 03/15/2019   anterior MI    History of kidney stones    Hyperparathyroidism ([place])    Hypertension 2002   Ischemic cardiomyopathy    Left knee pain    Syncope 04/04/2019   after blood draw   Vitamin D deficiency     Past Surgical History:  Procedure Laterality Date   AMPUTATION TOE Left 1991   2 digit of left foot   CHOLECYSTECTOMY     COLONOSCOPY     CORONARY/GRAFT ACUTE MI REVASCULARIZATION N/A 03/12/2019   Procedure: Coronary/Graft Acute MI Revascularization;  Surgeon: Belva Crome, MD;  Location: [facility];  Service: Cardiovascular;  Laterality: N/A;   ICD IMPLANT N/A 08/24/2019   Procedure: ICD IMPLANT;  Surgeon: Evans Lance, MD;  Location: [facility];  Service: Cardiovascular;  Laterality: N/A;   LEFT HEART CATH AND CORONARY ANGIOGRAPHY N/A 03/12/2019   Procedure: LEFT HEART CATH AND CORONARY ANGIOGRAPHY;  Surgeon: Belva Crome, MD;  Location: [facility];  Service: Cardiovascular;  Laterality: N/A;   LITHOTRIPSY Right     MEDICATIONS:  allopurinol (ZYLOPRIM) 100 MG tablet   aspirin 81 MG chewable tablet   atorvastatin (LIPITOR) 80 MG tablet   BIDIL 20-37.5 MG tablet   carvedilol (COREG) 12.5 MG tablet   ergocalciferol (VITAMIN D2) 1.25 MG (50000 UT) capsule   furosemide (LASIX) 40 MG tablet   ivabradine (CORLANOR) 5 MG TABS tablet   nitroGLYCERIN (NITROSTAT) 0.4 MG SL tablet   sodium bicarbonate 650 MG tablet   ticagrelor (BRILINTA) 90 MG TABS tablet   No current facility-administered medications for this encounter.     Maia Plan [facility] Pre-Surgical Testing [phone] 09/21/19  4:08 PM

## 2019-09-22 ENCOUNTER — Other Ambulatory Visit (HOSPITAL_COMMUNITY)
Admission: RE | Admit: 2019-09-22 | Discharge: 2019-09-22 | Disposition: A | Discharge status: Home / Self Care | Payer: Medicare HMO | Source: Ambulatory Visit | Attending: Urology | Admitting: Urology

## 2019-09-22 DIAGNOSIS — Z01812 Encounter for preprocedural laboratory examination: Secondary | ICD-10-CM | POA: Insufficient documentation

## 2019-09-22 DIAGNOSIS — Z20822 Contact with and (suspected) exposure to covid-19: Secondary | ICD-10-CM | POA: Diagnosis not present

## 2019-09-22 LAB — SARS CORONAVIRUS 2 (TAT 6-24 HRS): SARS Coronavirus 2: NEGATIVE

## 2019-09-25 ENCOUNTER — Other Ambulatory Visit: Payer: Self-pay | Admitting: Student

## 2019-09-25 ENCOUNTER — Other Ambulatory Visit: Payer: Self-pay | Admitting: Urology

## 2019-09-25 ENCOUNTER — Other Ambulatory Visit: Payer: Self-pay | Admitting: Radiology

## 2019-09-25 NOTE — Telephone Encounter (Signed)
Follow up  Pam from [facility] called to follow up clearance, she said they hope to get update soon since surgery is tomorrow

## 2019-09-26 ENCOUNTER — Encounter (HOSPITAL_COMMUNITY)
Admission: AD | Disposition: A | Discharge status: Home / Self Care | Payer: Self-pay | Source: Ambulatory Visit | Attending: Urology

## 2019-09-26 ENCOUNTER — Observation Stay (HOSPITAL_COMMUNITY): Payer: Medicare HMO

## 2019-09-26 ENCOUNTER — Inpatient Hospital Stay (HOSPITAL_COMMUNITY)
Admission: AD | Admit: 2019-09-26 | Discharge: 2019-09-29 | DRG: 659 | Disposition: A | Discharge status: Home / Self Care | Payer: Medicare HMO | Attending: Urology | Admitting: Urology

## 2019-09-26 ENCOUNTER — Encounter (HOSPITAL_COMMUNITY): Payer: Medicaid Other | Admitting: Internal Medicine

## 2019-09-26 ENCOUNTER — Encounter (HOSPITAL_COMMUNITY): Payer: Self-pay | Admitting: Urology

## 2019-09-26 ENCOUNTER — Ambulatory Visit (HOSPITAL_COMMUNITY): Payer: Medicare HMO | Admitting: Certified Registered"

## 2019-09-26 ENCOUNTER — Ambulatory Visit (HOSPITAL_COMMUNITY)
Admission: RE | Admit: 2019-09-26 | Discharge: 2019-09-26 | Disposition: A | Discharge status: Home / Self Care | Payer: Medicare HMO | Source: Ambulatory Visit | Attending: Urology | Admitting: Urology

## 2019-09-26 ENCOUNTER — Ambulatory Visit (HOSPITAL_COMMUNITY): Payer: Medicare HMO | Admitting: Physician Assistant

## 2019-09-26 ENCOUNTER — Telehealth: Payer: Self-pay

## 2019-09-26 ENCOUNTER — Other Ambulatory Visit: Payer: Self-pay

## 2019-09-26 ENCOUNTER — Ambulatory Visit (HOSPITAL_COMMUNITY): Payer: Medicare HMO

## 2019-09-26 DIAGNOSIS — Z8249 Family history of ischemic heart disease and other diseases of the circulatory system: Secondary | ICD-10-CM

## 2019-09-26 DIAGNOSIS — I97131 Postprocedural heart failure following other surgery: Secondary | ICD-10-CM | POA: Diagnosis not present

## 2019-09-26 DIAGNOSIS — I130 Hypertensive heart and chronic kidney disease with heart failure and stage 1 through stage 4 chronic kidney disease, or unspecified chronic kidney disease: Secondary | ICD-10-CM | POA: Diagnosis present

## 2019-09-26 DIAGNOSIS — E559 Vitamin D deficiency, unspecified: Secondary | ICD-10-CM | POA: Diagnosis present

## 2019-09-26 DIAGNOSIS — R0602 Shortness of breath: Secondary | ICD-10-CM

## 2019-09-26 DIAGNOSIS — I252 Old myocardial infarction: Secondary | ICD-10-CM

## 2019-09-26 DIAGNOSIS — R7981 Abnormal blood-gas level: Secondary | ICD-10-CM

## 2019-09-26 DIAGNOSIS — E872 Acidosis: Secondary | ICD-10-CM | POA: Diagnosis not present

## 2019-09-26 DIAGNOSIS — N200 Calculus of kidney: Secondary | ICD-10-CM

## 2019-09-26 DIAGNOSIS — Z955 Presence of coronary angioplasty implant and graft: Secondary | ICD-10-CM | POA: Diagnosis not present

## 2019-09-26 DIAGNOSIS — J810 Acute pulmonary edema: Secondary | ICD-10-CM | POA: Diagnosis not present

## 2019-09-26 DIAGNOSIS — J9601 Acute respiratory failure with hypoxia: Secondary | ICD-10-CM | POA: Diagnosis not present

## 2019-09-26 DIAGNOSIS — Y838 Other surgical procedures as the cause of abnormal reaction of the patient, or of later complication, without mention of misadventure at the time of the procedure: Secondary | ICD-10-CM | POA: Diagnosis not present

## 2019-09-26 DIAGNOSIS — Z87442 Personal history of urinary calculi: Secondary | ICD-10-CM

## 2019-09-26 DIAGNOSIS — D631 Anemia in chronic kidney disease: Secondary | ICD-10-CM | POA: Diagnosis present

## 2019-09-26 DIAGNOSIS — I2510 Atherosclerotic heart disease of native coronary artery without angina pectoris: Secondary | ICD-10-CM | POA: Diagnosis present

## 2019-09-26 DIAGNOSIS — I509 Heart failure, unspecified: Secondary | ICD-10-CM | POA: Diagnosis not present

## 2019-09-26 DIAGNOSIS — I255 Ischemic cardiomyopathy: Secondary | ICD-10-CM | POA: Diagnosis present

## 2019-09-26 DIAGNOSIS — Z833 Family history of diabetes mellitus: Secondary | ICD-10-CM | POA: Diagnosis not present

## 2019-09-26 DIAGNOSIS — Z79899 Other long term (current) drug therapy: Secondary | ICD-10-CM | POA: Diagnosis not present

## 2019-09-26 DIAGNOSIS — I5043 Acute on chronic combined systolic (congestive) and diastolic (congestive) heart failure: Secondary | ICD-10-CM | POA: Diagnosis not present

## 2019-09-26 DIAGNOSIS — N2581 Secondary hyperparathyroidism of renal origin: Secondary | ICD-10-CM | POA: Diagnosis present

## 2019-09-26 DIAGNOSIS — Z7982 Long term (current) use of aspirin: Secondary | ICD-10-CM | POA: Diagnosis not present

## 2019-09-26 DIAGNOSIS — J811 Chronic pulmonary edema: Secondary | ICD-10-CM | POA: Diagnosis not present

## 2019-09-26 DIAGNOSIS — E7800 Pure hypercholesterolemia, unspecified: Secondary | ICD-10-CM | POA: Diagnosis present

## 2019-09-26 DIAGNOSIS — N184 Chronic kidney disease, stage 4 (severe): Secondary | ICD-10-CM | POA: Diagnosis present

## 2019-09-26 DIAGNOSIS — I5021 Acute systolic (congestive) heart failure: Secondary | ICD-10-CM

## 2019-09-26 DIAGNOSIS — I5041 Acute combined systolic (congestive) and diastolic (congestive) heart failure: Secondary | ICD-10-CM | POA: Diagnosis not present

## 2019-09-26 DIAGNOSIS — Z7902 Long term (current) use of antithrombotics/antiplatelets: Secondary | ICD-10-CM | POA: Diagnosis not present

## 2019-09-26 DIAGNOSIS — Z95810 Presence of automatic (implantable) cardiac defibrillator: Secondary | ICD-10-CM

## 2019-09-26 DIAGNOSIS — Z89422 Acquired absence of other left toe(s): Secondary | ICD-10-CM

## 2019-09-26 DIAGNOSIS — E785 Hyperlipidemia, unspecified: Secondary | ICD-10-CM | POA: Diagnosis present

## 2019-09-26 DIAGNOSIS — Z9049 Acquired absence of other specified parts of digestive tract: Secondary | ICD-10-CM

## 2019-09-26 DIAGNOSIS — Z87891 Personal history of nicotine dependence: Secondary | ICD-10-CM

## 2019-09-26 DIAGNOSIS — N132 Hydronephrosis with renal and ureteral calculous obstruction: Secondary | ICD-10-CM | POA: Diagnosis present

## 2019-09-26 DIAGNOSIS — Z951 Presence of aortocoronary bypass graft: Secondary | ICD-10-CM

## 2019-09-26 HISTORY — PX: NEPHROLITHOTOMY: SHX5134

## 2019-09-26 HISTORY — PX: IR URETERAL STENT LEFT NEW ACCESS W/O SEP NEPHROSTOMY CATH: IMG6075

## 2019-09-26 LAB — CBC WITH DIFFERENTIAL/PLATELET
Abs Immature Granulocytes: 0.01 10*3/uL (ref 0.00–0.07)
Basophils Absolute: 0 10*3/uL (ref 0.0–0.1)
Basophils Relative: 0 %
Eosinophils Absolute: 0.2 10*3/uL (ref 0.0–0.5)
Eosinophils Relative: 3 %
HCT: 32.1 % — ABNORMAL LOW (ref 39.0–52.0)
Hemoglobin: 10.5 g/dL — ABNORMAL LOW (ref 13.0–17.0)
Immature Granulocytes: 0 %
Lymphocytes Relative: 26 %
Lymphs Abs: 1.5 10*3/uL (ref 0.7–4.0)
MCH: 32 pg (ref 26.0–34.0)
MCHC: 32.7 g/dL (ref 30.0–36.0)
MCV: 97.9 fL (ref 80.0–100.0)
Monocytes Absolute: 0.8 10*3/uL (ref 0.1–1.0)
Monocytes Relative: 13 %
Neutro Abs: 3.3 10*3/uL (ref 1.7–7.7)
Neutrophils Relative %: 58 %
Platelets: 163 10*3/uL (ref 150–400)
RBC: 3.28 MIL/uL — ABNORMAL LOW (ref 4.22–5.81)
RDW: 13.5 % (ref 11.5–15.5)
WBC: 5.8 10*3/uL (ref 4.0–10.5)
nRBC: 0 % (ref 0.0–0.2)

## 2019-09-26 LAB — TYPE AND SCREEN
ABO/RH(D): A POS
Antibody Screen: NEGATIVE

## 2019-09-26 LAB — PROTIME-INR
INR: 1 (ref 0.8–1.2)
Prothrombin Time: 12.8 seconds (ref 11.4–15.2)

## 2019-09-26 LAB — BRAIN NATRIURETIC PEPTIDE: B Natriuretic Peptide: 665.6 pg/mL — ABNORMAL HIGH (ref 0.0–100.0)

## 2019-09-26 LAB — BLOOD GAS, ARTERIAL
Acid-base deficit: 6.2 mmol/L — ABNORMAL HIGH (ref 0.0–2.0)
Bicarbonate: 19.6 mmol/L — ABNORMAL LOW (ref 20.0–28.0)
O2 Saturation: 87.8 %
Patient temperature: 97.3
pCO2 arterial: 42.9 mmHg (ref 32.0–48.0)
pH, Arterial: 7.282 — ABNORMAL LOW (ref 7.350–7.450)
pO2, Arterial: 65 mmHg — ABNORMAL LOW (ref 83.0–108.0)

## 2019-09-26 LAB — BASIC METABOLIC PANEL
Anion gap: 12 (ref 5–15)
BUN: 35 mg/dL — ABNORMAL HIGH (ref 8–23)
CO2: 18 mmol/L — ABNORMAL LOW (ref 22–32)
Calcium: 10.2 mg/dL (ref 8.9–10.3)
Chloride: 113 mmol/L — ABNORMAL HIGH (ref 98–111)
Creatinine, Ser: 3.5 mg/dL — ABNORMAL HIGH (ref 0.61–1.24)
GFR calc Af Amer: 20 mL/min — ABNORMAL LOW (ref >60)
GFR calc non Af Amer: 18 mL/min — ABNORMAL LOW (ref >60)
Glucose, Bld: 102 mg/dL — ABNORMAL HIGH (ref 70–99)
Potassium: 4 mmol/L (ref 3.5–5.1)
Sodium: 143 mmol/L (ref 135–145)

## 2019-09-26 LAB — HEMOGLOBIN AND HEMATOCRIT, BLOOD
HCT: 33.7 % — ABNORMAL LOW (ref 39.0–52.0)
Hemoglobin: 10.7 g/dL — ABNORMAL LOW (ref 13.0–17.0)

## 2019-09-26 SURGERY — NEPHROLITHOTOMY PERCUTANEOUS
Anesthesia: General | Site: Back | Laterality: Left

## 2019-09-26 MED ORDER — CEFAZOLIN SODIUM-DEXTROSE 2-4 GM/100ML-% IV SOLN
2.0000 g | INTRAVENOUS | Status: DC
Start: 2019-09-26 — End: 2019-09-26

## 2019-09-26 MED ORDER — FUROSEMIDE 10 MG/ML IJ SOLN
INTRAMUSCULAR | Status: AC
  Filled 2019-09-26: qty 4

## 2019-09-26 MED ORDER — LIDOCAINE 2% (20 MG/ML) 5 ML SYRINGE
INTRAMUSCULAR | Status: DC | PRN
  Administered 2019-09-26: 40 mg via INTRAVENOUS

## 2019-09-26 MED ORDER — ACETAMINOPHEN 500 MG PO TABS
1000.0000 mg | ORAL_TABLET | Freq: Once | ORAL | Status: AC
  Administered 2019-09-26: 1000 mg via ORAL
  Filled 2019-09-26: qty 2

## 2019-09-26 MED ORDER — DEXAMETHASONE SODIUM PHOSPHATE 10 MG/ML IJ SOLN
INTRAMUSCULAR | Status: DC | PRN
  Administered 2019-09-26: 4 mg via INTRAVENOUS

## 2019-09-26 MED ORDER — PHENYLEPHRINE HCL (PRESSORS) 10 MG/ML IV SOLN
INTRAVENOUS | Status: AC
  Filled 2019-09-26: qty 1

## 2019-09-26 MED ORDER — SODIUM BICARBONATE 650 MG PO TABS
650.0000 mg | ORAL_TABLET | Freq: Three times a day (TID) | ORAL | Status: DC
  Administered 2019-09-26 – 2019-09-29 (×9): 650 mg via ORAL
  Filled 2019-09-26 (×9): qty 1

## 2019-09-26 MED ORDER — PHENYLEPHRINE HCL-NACL 10-0.9 MG/250ML-% IV SOLN
INTRAVENOUS | Status: DC | PRN
Start: 2019-09-26 — End: 2019-09-26
  Administered 2019-09-26: 20 ug/min via INTRAVENOUS

## 2019-09-26 MED ORDER — FENTANYL CITRATE (PF) 100 MCG/2ML IJ SOLN
INTRAMUSCULAR | Status: AC | PRN
  Administered 2019-09-26: 25 ug via INTRAVENOUS
  Administered 2019-09-26 (×3): 50 ug via INTRAVENOUS
  Administered 2019-09-26: 25 ug via INTRAVENOUS

## 2019-09-26 MED ORDER — FENTANYL CITRATE (PF) 100 MCG/2ML IJ SOLN
INTRAMUSCULAR | Status: AC
  Filled 2019-09-26: qty 2

## 2019-09-26 MED ORDER — FLEET ENEMA 7-19 GM/118ML RE ENEM
1.0000 | ENEMA | Freq: Once | RECTAL | Status: DC | PRN
  Filled 2019-09-26: qty 1

## 2019-09-26 MED ORDER — LIDOCAINE HCL (PF) 1 % IJ SOLN
INTRAMUSCULAR | Status: AC | PRN
  Administered 2019-09-26: 10 mL via INTRADERMAL

## 2019-09-26 MED ORDER — CARVEDILOL 6.25 MG PO TABS
6.2500 mg | ORAL_TABLET | Freq: Two times a day (BID) | ORAL | Status: DC
  Filled 2019-09-26: qty 1

## 2019-09-26 MED ORDER — FUROSEMIDE 40 MG PO TABS
40.0000 mg | ORAL_TABLET | Freq: Every day | ORAL | Status: DC
  Filled 2019-09-26: qty 1

## 2019-09-26 MED ORDER — ONDANSETRON HCL 4 MG/2ML IJ SOLN
INTRAMUSCULAR | Status: AC
  Filled 2019-09-26: qty 2

## 2019-09-26 MED ORDER — MIDAZOLAM HCL 2 MG/2ML IJ SOLN
INTRAMUSCULAR | Status: AC
  Filled 2019-09-26: qty 2

## 2019-09-26 MED ORDER — FUROSEMIDE 10 MG/ML IJ SOLN
INTRAMUSCULAR | Status: AC
  Administered 2019-09-26: 10 mg
  Filled 2019-09-26: qty 2

## 2019-09-26 MED ORDER — SUGAMMADEX SODIUM 200 MG/2ML IV SOLN
INTRAVENOUS | Status: DC | PRN
  Administered 2019-09-26: 200 mg via INTRAVENOUS

## 2019-09-26 MED ORDER — FENTANYL CITRATE (PF) 250 MCG/5ML IJ SOLN
INTRAMUSCULAR | Status: DC | PRN
  Administered 2019-09-26: 100 ug via INTRAVENOUS

## 2019-09-26 MED ORDER — ALLOPURINOL 100 MG PO TABS
100.0000 mg | ORAL_TABLET | Freq: Every day | ORAL | Status: DC
  Administered 2019-09-27 – 2019-09-29 (×3): 100 mg via ORAL
  Filled 2019-09-26 (×4): qty 1

## 2019-09-26 MED ORDER — ONDANSETRON HCL 4 MG/2ML IJ SOLN: 4.0000 mg | Freq: Once | INTRAMUSCULAR | Status: DC | PRN

## 2019-09-26 MED ORDER — CEFAZOLIN SODIUM-DEXTROSE 1-4 GM/50ML-% IV SOLN
1.0000 g | Freq: Three times a day (TID) | INTRAVENOUS | Status: DC
  Administered 2019-09-26 – 2019-09-28 (×3): 1 g via INTRAVENOUS
  Filled 2019-09-26 (×3): qty 50

## 2019-09-26 MED ORDER — ISOSORB DINITRATE-HYDRALAZINE 20-37.5 MG PO TABS
1.0000 | ORAL_TABLET | Freq: Two times a day (BID) | ORAL | Status: DC
  Filled 2019-09-26: qty 1

## 2019-09-26 MED ORDER — OXYCODONE HCL 5 MG PO TABS
5.0000 mg | ORAL_TABLET | ORAL | Status: DC | PRN
  Administered 2019-09-28: 5 mg via ORAL
  Filled 2019-09-26: qty 1

## 2019-09-26 MED ORDER — CEFAZOLIN SODIUM-DEXTROSE 2-4 GM/100ML-% IV SOLN
2.0000 g | INTRAVENOUS | Status: AC
  Administered 2019-09-26: 2 g via INTRAVENOUS

## 2019-09-26 MED ORDER — ISOSORB DINITRATE-HYDRALAZINE 20-37.5 MG PO TABS
1.0000 | ORAL_TABLET | Freq: Two times a day (BID) | ORAL | Status: DC
  Administered 2019-09-26 – 2019-09-29 (×6): 1 via ORAL
  Filled 2019-09-26 (×6): qty 1

## 2019-09-26 MED ORDER — PHENYLEPHRINE 40 MCG/ML (10ML) SYRINGE FOR IV PUSH (FOR BLOOD PRESSURE SUPPORT)
PREFILLED_SYRINGE | INTRAVENOUS | Status: AC
  Filled 2019-09-26: qty 10

## 2019-09-26 MED ORDER — CARVEDILOL 12.5 MG PO TABS
12.5000 mg | ORAL_TABLET | Freq: Two times a day (BID) | ORAL | Status: DC
  Administered 2019-09-26 – 2019-09-29 (×7): 12.5 mg via ORAL
  Filled 2019-09-26 (×7): qty 1

## 2019-09-26 MED ORDER — SENNOSIDES-DOCUSATE SODIUM 8.6-50 MG PO TABS
1.0000 | ORAL_TABLET | Freq: Every evening | ORAL | Status: DC | PRN
  Filled 2019-09-26: qty 1

## 2019-09-26 MED ORDER — ONDANSETRON HCL 4 MG/2ML IJ SOLN
INTRAMUSCULAR | Status: DC | PRN
  Administered 2019-09-26: 4 mg via INTRAVENOUS

## 2019-09-26 MED ORDER — LIDOCAINE HCL 1 % IJ SOLN
INTRAMUSCULAR | Status: AC
  Filled 2019-09-26: qty 20

## 2019-09-26 MED ORDER — NALOXONE HCL 0.4 MG/ML IJ SOLN
INTRAMUSCULAR | Status: AC
  Filled 2019-09-26: qty 1

## 2019-09-26 MED ORDER — LACTATED RINGERS IV SOLN: INTRAVENOUS | Status: DC

## 2019-09-26 MED ORDER — CEFAZOLIN SODIUM-DEXTROSE 1-4 GM/50ML-% IV SOLN
INTRAVENOUS | Status: AC
  Administered 2019-09-27: 1 g via INTRAVENOUS
  Filled 2019-09-26: qty 50

## 2019-09-26 MED ORDER — ACETAMINOPHEN 10 MG/ML IV SOLN
1000.0000 mg | Freq: Four times a day (QID) | INTRAVENOUS | Status: AC
  Administered 2019-09-27: 1000 mg via INTRAVENOUS

## 2019-09-26 MED ORDER — ATORVASTATIN CALCIUM 40 MG PO TABS
80.0000 mg | ORAL_TABLET | Freq: Every day | ORAL | Status: DC
  Administered 2019-09-27 – 2019-09-29 (×3): 80 mg via ORAL
  Filled 2019-09-26: qty 1
  Filled 2019-09-26 (×3): qty 2

## 2019-09-26 MED ORDER — FUROSEMIDE 10 MG/ML IJ SOLN
INTRAMUSCULAR | Status: AC
  Administered 2019-09-27: 40 mg via INTRAVENOUS
  Filled 2019-09-26: qty 4

## 2019-09-26 MED ORDER — LIDOCAINE 2% (20 MG/ML) 5 ML SYRINGE
INTRAMUSCULAR | Status: AC
  Filled 2019-09-26: qty 5

## 2019-09-26 MED ORDER — IVABRADINE HCL 5 MG PO TABS
5.0000 mg | ORAL_TABLET | Freq: Two times a day (BID) | ORAL | Status: DC
  Administered 2019-09-27 – 2019-09-29 (×6): 5 mg via ORAL
  Filled 2019-09-26 (×6): qty 1

## 2019-09-26 MED ORDER — MIDAZOLAM HCL 2 MG/2ML IJ SOLN
INTRAMUSCULAR | Status: AC | PRN
  Administered 2019-09-26: 1 mg via INTRAVENOUS
  Administered 2019-09-26 (×3): 0.5 mg via INTRAVENOUS

## 2019-09-26 MED ORDER — ROCURONIUM BROMIDE 10 MG/ML (PF) SYRINGE
PREFILLED_SYRINGE | INTRAVENOUS | Status: AC
  Filled 2019-09-26: qty 10

## 2019-09-26 MED ORDER — LABETALOL HCL 5 MG/ML IV SOLN: 10.0000 mg | INTRAVENOUS | Status: DC | PRN

## 2019-09-26 MED ORDER — PROPOFOL 10 MG/ML IV BOLUS
INTRAVENOUS | Status: AC
  Filled 2019-09-26: qty 40

## 2019-09-26 MED ORDER — FUROSEMIDE 10 MG/ML IJ SOLN: 10.0000 mg | Freq: Once | INTRAMUSCULAR | Status: DC

## 2019-09-26 MED ORDER — ZOLPIDEM TARTRATE 5 MG PO TABS: 5.0000 mg | ORAL_TABLET | Freq: Every evening | ORAL | Status: DC | PRN

## 2019-09-26 MED ORDER — NITROGLYCERIN 0.4 MG SL SUBL: 0.4000 mg | SUBLINGUAL_TABLET | SUBLINGUAL | Status: DC | PRN

## 2019-09-26 MED ORDER — HYDROMORPHONE HCL 1 MG/ML IJ SOLN
INTRAMUSCULAR | Status: AC
  Administered 2019-09-26: 0.5 mg via INTRAVENOUS
  Filled 2019-09-26: qty 1

## 2019-09-26 MED ORDER — ACETAMINOPHEN 10 MG/ML IV SOLN
INTRAVENOUS | Status: AC
  Administered 2019-09-27: 1000 mg via INTRAVENOUS
  Filled 2019-09-26: qty 100

## 2019-09-26 MED ORDER — ONDANSETRON HCL 4 MG/2ML IJ SOLN
4.0000 mg | INTRAMUSCULAR | Status: DC | PRN
  Administered 2019-09-27 – 2019-09-28 (×3): 4 mg via INTRAVENOUS
  Filled 2019-09-26 (×3): qty 2

## 2019-09-26 MED ORDER — HYDROMORPHONE HCL 1 MG/ML IJ SOLN
0.5000 mg | INTRAMUSCULAR | Status: DC | PRN
  Administered 2019-09-27 (×3): 1 mg via INTRAVENOUS
  Administered 2019-09-27 (×2): 0.5 mg via INTRAVENOUS
  Administered 2019-09-28 – 2019-09-29 (×4): 1 mg via INTRAVENOUS
  Filled 2019-09-26 (×8): qty 1

## 2019-09-26 MED ORDER — ROCURONIUM BROMIDE 10 MG/ML (PF) SYRINGE
PREFILLED_SYRINGE | INTRAVENOUS | Status: DC | PRN
  Administered 2019-09-26 (×2): 10 mg via INTRAVENOUS
  Administered 2019-09-26: 70 mg via INTRAVENOUS

## 2019-09-26 MED ORDER — POTASSIUM CHLORIDE IN NACL 20-0.45 MEQ/L-% IV SOLN
INTRAVENOUS | Status: DC
  Filled 2019-09-26 (×5): qty 1000

## 2019-09-26 MED ORDER — CEFAZOLIN SODIUM-DEXTROSE 2-4 GM/100ML-% IV SOLN
INTRAVENOUS | Status: AC
  Filled 2019-09-26: qty 100

## 2019-09-26 MED ORDER — IOHEXOL 300 MG/ML  SOLN
INTRAMUSCULAR | Status: DC | PRN
  Administered 2019-09-26: 30 mL

## 2019-09-26 MED ORDER — ACETAMINOPHEN 325 MG PO TABS: 650.0000 mg | ORAL_TABLET | ORAL | Status: DC | PRN

## 2019-09-26 MED ORDER — ETOMIDATE 2 MG/ML IV SOLN
INTRAVENOUS | Status: DC | PRN
  Administered 2019-09-26: 20 mg via INTRAVENOUS

## 2019-09-26 MED ORDER — BISACODYL 10 MG RE SUPP
10.0000 mg | Freq: Every day | RECTAL | Status: DC | PRN
  Filled 2019-09-26: qty 1

## 2019-09-26 MED ORDER — SODIUM CHLORIDE 0.9 % IR SOLN
Status: DC | PRN
  Administered 2019-09-26: 18000 mL

## 2019-09-26 MED ORDER — FUROSEMIDE 10 MG/ML IJ SOLN
40.0000 mg | Freq: Two times a day (BID) | INTRAMUSCULAR | Status: DC
  Administered 2019-09-27 – 2019-09-28 (×4): 40 mg via INTRAVENOUS
  Filled 2019-09-26 (×3): qty 4

## 2019-09-26 MED ORDER — FUROSEMIDE 10 MG/ML IJ SOLN
10.0000 mg | Freq: Once | INTRAMUSCULAR | Status: AC
  Administered 2019-09-26: 10 mg via INTRAVENOUS

## 2019-09-26 MED ORDER — FUROSEMIDE 10 MG/ML IJ SOLN
40.0000 mg | Freq: Once | INTRAMUSCULAR | Status: AC
  Administered 2019-09-26: 40 mg via INTRAVENOUS

## 2019-09-26 MED ORDER — IOHEXOL 300 MG/ML  SOLN
50.0000 mL | Freq: Once | INTRAMUSCULAR | Status: AC | PRN
  Administered 2019-09-26: 20 mL

## 2019-09-26 MED ORDER — FENTANYL CITRATE (PF) 100 MCG/2ML IJ SOLN
25.0000 ug | INTRAMUSCULAR | Status: DC | PRN
  Administered 2019-09-26 (×3): 50 ug via INTRAVENOUS

## 2019-09-26 MED ORDER — FLUMAZENIL 0.5 MG/5ML IV SOLN
INTRAVENOUS | Status: AC
  Filled 2019-09-26: qty 5

## 2019-09-26 MED ORDER — DEXAMETHASONE SODIUM PHOSPHATE 10 MG/ML IJ SOLN
INTRAMUSCULAR | Status: AC
  Filled 2019-09-26: qty 1

## 2019-09-26 SURGICAL SUPPLY — 52 items
APL PRP STRL LF DISP 70% ISPRP (MISCELLANEOUS) ×1
APL SKNCLS STERI-STRIP NONHPOA (GAUZE/BANDAGES/DRESSINGS) ×1
BAG DRN RND TRDRP ANRFLXCHMBR (UROLOGICAL SUPPLIES) ×1
BAG URINE DRAIN 2000ML AR STRL (UROLOGICAL SUPPLIES) ×3 IMPLANT
BASKET ZERO TIP NITINOL 2.4FR (BASKET) IMPLANT
BENZOIN TINCTURE PRP APPL 2/3 (GAUZE/BANDAGES/DRESSINGS) ×3 IMPLANT
BLADE SURG 15 STRL LF DISP TIS (BLADE) ×1 IMPLANT
BLADE SURG 15 STRL SS (BLADE) ×3
BSKT STON RTRVL ZERO TP 2.4FR (BASKET)
CATH AINSWORTH 30CC 24FR (CATHETERS) IMPLANT
CATH COUNCIL 22FR (CATHETERS) ×2 IMPLANT
CATH ROBINSON RED A/P 20FR (CATHETERS) IMPLANT
CATH URET 5FR 28IN OPEN ENDED (CATHETERS) ×2 IMPLANT
CATH URET DUAL LUMEN 6-10FR 50 (CATHETERS) ×3 IMPLANT
CATH UROLOGY TORQUE 40 (MISCELLANEOUS) ×3 IMPLANT
CATH X-FORCE N30 NEPHROSTOMY (TUBING) ×7 IMPLANT
CHLORAPREP W/TINT 26 (MISCELLANEOUS) ×3 IMPLANT
COVER SURGICAL LIGHT HANDLE (MISCELLANEOUS) ×2 IMPLANT
COVER WAND RF STERILE (DRAPES) ×2 IMPLANT
DRAPE C-ARM 42X120 X-RAY (DRAPES) ×3 IMPLANT
DRAPE LINGEMAN PERC (DRAPES) ×3 IMPLANT
DRAPE SURG IRRIG POUCH 19X23 (DRAPES) ×3 IMPLANT
DRSG PAD ABDOMINAL 8X10 ST (GAUZE/BANDAGES/DRESSINGS) ×4 IMPLANT
DRSG TEGADERM 8X12 (GAUZE/BANDAGES/DRESSINGS) ×4 IMPLANT
EXTRACTOR STONE NITINOL NGAGE (UROLOGICAL SUPPLIES) ×2 IMPLANT
EXTRACTOR STONE PERC NCIRCLE (MISCELLANEOUS) ×2 IMPLANT
FIBER LASER FLEXIVA 365 (UROLOGICAL SUPPLIES) IMPLANT
FIBER LASER TRAC TIP (UROLOGICAL SUPPLIES) IMPLANT
GAUZE SPONGE 4X4 12PLY STRL (GAUZE/BANDAGES/DRESSINGS) ×3 IMPLANT
GLOVE SURG SS PI 8.0 STRL IVOR (GLOVE) ×3 IMPLANT
GOWN STRL REUS W/TWL XL LVL3 (GOWN DISPOSABLE) ×3 IMPLANT
GUIDEWIRE AMPLAZ .035X145 (WIRE) ×6 IMPLANT
KIT BASIN (CUSTOM PROCEDURE TRAY) ×3 IMPLANT
KIT PROBE 340X3.4XDISP GRN (MISCELLANEOUS) IMPLANT
KIT PROBE TRILOGY 3.4X340 (MISCELLANEOUS) ×3
KIT PROBE TRILOGY 3.9X350 (MISCELLANEOUS) ×2 IMPLANT
KIT TURNOVER KIT A (KITS) ×2 IMPLANT
MANIFOLD NEPTUNE II (INSTRUMENTS) ×3 IMPLANT
NS IRRIG 1000ML POUR BTL (IV SOLUTION) ×3 IMPLANT
SPONGE LAP 4X18 RFD (DISPOSABLE) ×3 IMPLANT
SUT SILK 2 0 30  PSL (SUTURE) ×3
SUT SILK 2 0 30 PSL (SUTURE) ×1 IMPLANT
SYR 10ML LL (SYRINGE) ×3 IMPLANT
SYR 20ML LL LF (SYRINGE) ×6 IMPLANT
TOWEL OR 17X26 10 PK STRL BLUE (TOWEL DISPOSABLE) ×3 IMPLANT
TOWEL OR NON WOVEN STRL DISP B (DISPOSABLE) ×3 IMPLANT
TRAY CYSTO PACK (CUSTOM PROCEDURE TRAY) ×3 IMPLANT
TRAY FOLEY MTR SLVR 16FR STAT (SET/KITS/TRAYS/PACK) ×3 IMPLANT
TUBING CONNECTING 10 (TUBING) ×4 IMPLANT
TUBING CONNECTING 10' (TUBING) ×2
TUBING STONE CATCHER TRILOGY (MISCELLANEOUS) ×2 IMPLANT
TUBING UROLOGY SET (TUBING) ×3 IMPLANT

## 2019-09-26 NOTE — Progress Notes (Signed)
Cardiologist has been at bedside as [facility] as CC MD, orders placed and done

## 2019-09-26 NOTE — Consult Note (Signed)
NAME:  [name], MRN:  [id number], DOB:  June 15, 1955, LOS: 0 ADMISSION DATE:  09/26/2019, CONSULTATION DATE:  09/26/2019 REFERRING MD:  Dr. Jeffie Pollock, Urology, CHIEF COMPLAINT:  Hypoxia   Brief History   64 yo male with CKD 4 and Lt renal staghorn calculus had Lt percutaneous nephrolithotomy on 09/26/19.  Post op he developed hypoxia and PCCM asked to assess.  History of present illness   Was recently seen by nephrology for CKD.  Found to have staghorn calculus.  Presented for nephrolithotomy.  Post procedure developed hypoxia requiring supplemental oxygen.  No report of vomiting or aspiration event during the procedure.  He received about 2 liters fluid during the course of the day.  Chest xray done in PACU showed pulmonary edema.  He was seen by cardiology.  Lasix ordered.  Respiratory status and O2 needs improved some.  He c/o pain at surgery site.  Denies chest pain, sputum, or chest congestion.    Past Medical History  Vit D deficiency, CAD s/p CABG, Ischemic CM, s/p AICD, HTN, Secondary hyperparathyroidism, Nephrolithiasis, CKD 4  Significant Hospital Events   5/25 Lt percutaneous nephrolithotomy, hypoxia after procedure  Consults:  Cardiology  Procedures:    Significant Diagnostic Tests:  Echo 07/05/19 >> EF 20 to 25%, severe LA dilation, mild MR  Micro Data:    Antimicrobials:    Interim history/subjective:    Objective   Blood pressure (!) 156/111, pulse 73, temperature 97.6 F (36.4 C), resp. rate (!) 26, weight 93.6 kg, SpO2 91 %.        Intake/Output Summary (Last 24 hours) at 09/26/2019 1818 Last data filed at 09/26/2019 1700 Gross per 24 hour  Intake 2040.25 ml  Output 775 ml  Net 1265.25 ml   Filed Weights   09/26/19 0738  Weight: 93.6 kg    Examination:  General - seen in PACU Eyes - pupils reactive ENT - no sinus tenderness, no stridor Cardiac - regular rate/rhythm, no murmur Chest - faint b/l crackles Abdomen - soft, non tender, + bowel  sounds, Lt nephrostomy tube in place Extremities - no cyanosis, clubbing, or edema Skin - no rashes Neuro - sleepy, wakes up easily, conversant, moves all extremities  Resolved Hospital Problem list     Assessment & Plan:   Acute hypoxic respiratory failure 2nd to acute pulmonary edema. - continue supplemental oxygen to keep SpO2 > 92% - f/u CXR - don't think he needs assisted ventilation at this time  Acute on chronic combined CHF. Hx of CAD with ischemic cardiomyopathy, hypertension, hyperlipidemia. - cardiology consulted - prn labelalol IV for SBP > 160, DBP > 105  CKD 4. - f/u BMET  Nephrolithiasis s/p nephrolithotomy with nephrostomy placement. - post op care per urology  Best practice:  Diet: sips with meds DVT prophylaxis: SCDs GI prophylaxis: not indicated  Mobility: bed rest Code Status: full code Disposition: remain in PACU until SDU bed available  Labs:  CBC: Recent Labs  Lab 09/20/19 0913 09/26/19 0721 09/26/19 1456  WBC 6.0 5.8  --   NEUTROABS  --  3.3  --   HGB 11.0* 10.5* 10.7*  HCT 33.8* 32.1* 33.7*  MCV 99.1 97.9  --   PLT 170 163  --     Basic Metabolic Panel: Recent Labs  Lab 09/20/19 0913 09/26/19 0721  NA 141 143  K 4.2 4.0  CL 108 113*  CO2 25 18*  GLUCOSE 113* 102*  BUN 30* 35*  CREATININE 3.22* 3.50*  CALCIUM 10.4*  10.2   GFR: Estimated Creatinine Clearance: 26 mL/min (A) (by C-G formula based on SCr of 3.5 mg/dL (H)). Recent Labs  Lab 09/20/19 0913 09/26/19 0721  WBC 6.0 5.8    Liver Function Tests: No results for input(s): AST, ALT, ALKPHOS, BILITOT, PROT, ALBUMIN in the last 168 hours. No results for input(s): LIPASE, AMYLASE in the last 168 hours. No results for input(s): AMMONIA in the last 168 hours.  ABG    Component Value Date/Time   PHART 7.282 (L) 09/26/2019 1612   PCO2ART 42.9 09/26/2019 1612   PO2ART 65.0 (L) 09/26/2019 1612   HCO3 19.6 (L) 09/26/2019 1612   TCO2 20 (L) 03/12/2019 1015    ACIDBASEDEF 6.2 (H) 09/26/2019 1612   O2SAT 87.8 09/26/2019 1612     Coagulation Profile: Recent Labs  Lab 09/26/19 0721  INR 1.0    Cardiac Enzymes: No results for input(s): CKTOTAL, CKMB, CKMBINDEX, TROPONINI in the last 168 hours.  HbA1C: Hgb A1c MFr Bld  Date/Time Value Ref Range Status  03/13/2019 02:43 AM 5.4 4.8 - 5.6 % Final    Comment:    (NOTE) Pre diabetes:          5.7%-6.4% Diabetes:              >6.4% Glycemic control for   <7.0% adults with diabetes   05/28/2016 09:38 AM 5.0 <5.7 % Final    Comment:      For the purpose of screening for the presence of diabetes:   <5.7%       Consistent with the absence of diabetes 5.7-6.4 %   Consistent with increased risk for diabetes (prediabetes) >=6.5 %     Consistent with diabetes   This assay result is consistent with a decreased risk of diabetes.   Currently, no consensus exists regarding use of hemoglobin A1c for diagnosis of diabetes in children.   According to American Diabetes Association (ADA) guidelines, hemoglobin A1c <7.0% represents optimal control in non-pregnant diabetic patients. Different metrics may apply to specific patient populations. Standards of Medical Care in Diabetes (ADA).       CBG: No results for input(s): GLUCAP in the last 168 hours.  Review of Systems:   Reviewed and negative  Past Medical History  He,  has a past medical history of Acute ST elevation myocardial infarction (STEMI) due to occlusion of mid portion of left anterior descending (LAD) coronary artery ([place]) (99/07/5699), Acute systolic heart failure ([place]), Arthritis, CHF (congestive heart failure) ([place]), CKD (chronic kidney disease) stage 4, GFR 15-29 ml/min ([place]), Dyspnea, Heart attack ([place]) (03/15/2019), History of kidney stones, Hyperparathyroidism ([place]), Hypertension (2002), Ischemic cardiomyopathy, Left knee pain, Syncope (04/04/2019), and Vitamin D deficiency.   Surgical History    Past Surgical History:    Procedure Laterality Date  . AMPUTATION TOE Left 1991   2 digit of left foot  . CHOLECYSTECTOMY    . COLONOSCOPY    . CORONARY/GRAFT ACUTE MI REVASCULARIZATION N/A 03/12/2019   Procedure: Coronary/Graft Acute MI Revascularization;  Surgeon: Belva Crome, MD;  Location: [facility];  Service: Cardiovascular;  Laterality: N/A;  . ICD IMPLANT N/A 08/24/2019   Procedure: ICD IMPLANT;  Surgeon: Evans Lance, MD;  Location: [facility];  Service: Cardiovascular;  Laterality: N/A;  . IR URETERAL STENT LEFT NEW ACCESS W/O SEP NEPHROSTOMY CATH  09/26/2019  . LEFT HEART CATH AND CORONARY ANGIOGRAPHY N/A 03/12/2019   Procedure: LEFT HEART CATH AND CORONARY ANGIOGRAPHY;  Surgeon: Belva Crome,  MD;  Location: [facility];  Service: Cardiovascular;  Laterality: N/A;  . LITHOTRIPSY Right      Social History   reports that he quit smoking about 6 months ago. His smoking use included cigarettes. He has a 20.00 pack-year smoking history. He has never used smokeless tobacco. He reports that he does not drink alcohol or use drugs.   Family History   His family history includes Cancer in his mother; Diabetes in his brother; Heart attack in his father; Heart disease in his father and mother; Heart failure in his mother; Hypertension in his father.   Allergies No Known Allergies   Home Medications  Prior to Admission medications   Medication Sig Start Date End Date Taking? Authorizing Provider  allopurinol (ZYLOPRIM) 100 MG tablet Take 100 mg by mouth daily.  04/25/19 06/27/20 Yes [provider]  aspirin 81 MG chewable tablet Chew 1 tablet (81 mg total) by mouth daily. 03/16/19  Yes Cheryln Manly, NP  atorvastatin (LIPITOR) 80 MG tablet Take 1 tablet (80 mg total) by mouth daily at 6 PM. 09/15/19  Yes Fayrene Helper, MD  BIDIL 20-37.5 MG tablet Take 1 tablet by mouth twice daily Patient taking differently: Take 1 tablet by mouth in the morning and at bedtime.   07/18/19  Yes Corum, Rex Kras, MD  carvedilol (COREG) 12.5 MG tablet TAKE 1 TABLET BY MOUTH TWICE DAILY WITH MEALS Patient taking differently: Take 12.5 mg by mouth 2 (two) times daily with a meal.  08/28/19  Yes Bensimhon, Shaune Pascal, MD  ergocalciferol (VITAMIN D2) 1.25 MG (50000 UT) capsule Take 50,000 Units by mouth every Wednesday.  08/09/19 09/28/19 Yes [provider]  furosemide (LASIX) 40 MG tablet TAKE 1 TABLET BY MOUTH ONCE DAILY AS NEEDED FOR FLUID Patient taking differently: Take 40 mg by mouth daily.  08/28/19  Yes Bensimhon, Shaune Pascal, MD  ivabradine (CORLANOR) 5 MG TABS tablet Take 1 tablet (5 mg total) by mouth 2 (two) times daily with a meal. 08/01/19  Yes Lyda Jester M, PA-C  nitroGLYCERIN (NITROSTAT) 0.4 MG SL tablet Place 1 tablet (0.4 mg total) under the tongue every 5 (five) minutes as needed. 04/11/19  Yes Corum, Rex Kras, MD  sodium bicarbonate 650 MG tablet Take 650 mg by mouth 3 (three) times daily.   Yes [provider]  ticagrelor (BRILINTA) 90 MG TABS tablet Take 1 tablet (90 mg total) by mouth 2 (two) times daily. 05/18/19  Yes Bensimhon, Shaune Pascal, MD     Critical care time: 32 minutes   Chesley Mires, MD [place] Pager - [phone] 09/26/2019, 6:30 PM

## 2019-09-26 NOTE — Op Note (Signed)
Procedure: 1.  Left percutaneous nephrolithotomy for 3.7 cm staghorn calculus. 2.  Left antegrade nephrostogram through pre-existing tract with interpretation. 3.  Application of fluoroscopy.  Preop diagnosis: Left staghorn stone with upper pole obstruction.  Postop diagnosis: Same.  Surgeon: Dr. John Wrenn.  Anesthesia: General.  Specimen: Stone fragments.  Drains: 22 French council nephrostomy tube with 5 French open-ended ureteral catheter and 16 French Foley catheter.  EBL: 50 mL.  Complications: None.  Indications: The patient is a 64-year-old male with a staghorn calculus of the left kidney with obstruction of the upper pole and some renal atrophy with progressive CKD.  It was felt that percutaneous nephrolithotomy was indicated as his stone burden was well in excess of 2.5 cm.  Procedure: We had received cardiac clearance and he was off his Brilinta and aspirin.  He was taken to interventional radiology and was given Ancef 2 g.  Left upper pole renal access was obtained.  He was then taken to the operating room where a general anesthetic was induced on the holding room stretcher.  A Foley catheter was placed using sterile technique.  He was then rolled prone on chest rolls with care taken to pad all pressure points and he was fitted with PAS hose.  His nephrostomy site was prepped with ChloraPrep and after the appropriate drying time he was draped in usual sterile fashion.  An Amplatz super stiff wire was passed through the access catheter into the bladder and the access catheter was removed.  The incision was enlarged to 2 cm with a knife and the dual-lumen 8 French catheter was passed over the wire into the distal ureter and a second Super Stiff wire was inserted.  The tip of the second Super Stiff was in the distal ureter but not in the bladder.  The dual-lumen cath was removed and the nephro view catheter was reinserted over the second wire to negotiate it into the  bladder.  The NephroMax balloon was placed over the working wire and the safety wire was secured.  The balloon was inflated to 25 atm and the nephrostomy sheath was then passed into the renal pelvis under fluoroscopic guidance.  The balloon was deflated and removed and there was minimal bleeding noted.  The rigid nephroscope was assembled and inserted in the lower pole component of the staghorn calculus was initially identified.  The trilogy lithotrite was then passed through the scope and the stone was broken into manageable fragments some of which were removed with the 2 jaw grasper and the remainder were aspirated out using the lithotrite.  On fluoroscopy residual stones were noted in the lower pole so I switched to the flexible scope and was able to retrieve 2 additional stones from the lower calyces using basket extraction with the engage basket for 1 and a large nitinol basket for the second.  The rigid scope was then used to locate and fragment the larger upper pole calculus.  Several fragments were removed with the graspers with the residual stone fragments removed with the lithotrite and aspiration.  At this point fluoroscopy demonstrated to midpole lateral stones and a flexible scope was then reinserted.  I was able to remove one of the stones but was unable to identify the calyx containing the final stone which was approximately 5 mm in size.  Contrast was instilled through the cystoscope to try to better clarify the location of the remaining stone and it appeared to be in an isolated calyx with infundibular stenosis.  Further   attempts at localization were performed with the flexible scope but were unsuccessful.  A few small fragments were then removed from the renal pelvis and fluoroscopy demonstrated no other significant residual stone fragments other than the 1 stone in the isolated calyx.  The nephro view catheter was passed over the safety wire which was then removed and a 5 French  open-ended catheter was passed through a 22 French council Foley catheter and inserted over the working wire so that the tip of the Foley was in the renal pelvis and the tip of the open ended catheter was in the bladder.  Additional contrast was instilled  The antegrade pyelogram demonstrated no extravasation and good antegrade flow.  The Foley balloon was filled with 3 mL of sterile fluid and the nephrostomy sheath was backed out.  The Foley was secured to the skin with 2-0 silk suture and the sheath was cut away from the catheter.  The Foley was then placed to straight drainage, the drapes were removed and a dressing was applied.  The patient was then rolled back supine on the recovery room stretcher.  His anesthetic was reversed and he was moved to recovery room in stable condition.  A portion of the stone was sent for analysis.  There were no complications. 

## 2019-09-26 NOTE — Anesthesia Preprocedure Evaluation (Addendum)
Anesthesia Evaluation  Patient identified by MRN, date of birth, ID band Patient awake    Reviewed: Allergy & Precautions, NPO status , Patient's Chart, lab work & pertinent test results, reviewed documented beta blocker date and time   History of Anesthesia Complications Negative for: history of anesthetic complications  Airway Mallampati: II  TM Distance: >3 FB Neck ROM: Full    Dental  (+) Dental Advisory Given, Edentulous Lower, Edentulous Upper   Pulmonary former smoker,    Pulmonary exam normal breath sounds clear to auscultation       Cardiovascular hypertension, Pt. on home beta blockers and Pt. on medications (-) angina+ CAD, + Past MI, + Cardiac Stents and +CHF  Normal cardiovascular exam+ Cardiac Defibrillator  Rhythm:Regular Rate:Normal  Echo 07/05/19: 1. Left ventricular ejection fraction, by estimation, is 20 to 25%. The  left ventricle has severely decreased function. The left ventricle  demonstrates regional wall motion abnormalities (see scoring  diagram/findings for description). The left  ventricular internal cavity size was mildly dilated. Left ventricular  diastolic parameters are indeterminate.  2. Right ventricular systolic function is normal. The right ventricular  size is normal. There is normal pulmonary artery systolic pressure.  3. Left atrial size was severely dilated.  4. The mitral valve is normal in structure and function. Mild mitral  valve regurgitation. No evidence of mitral stenosis.  5. The aortic valve is tricuspid. Aortic valve regurgitation is not  visualized. No aortic stenosis is present.  6. The inferior vena cava is normal in size with greater than 50%  respiratory variability, suggesting right atrial pressure of 3 mmHg.    Neuro/Psych negative neurological ROS  negative psych ROS   GI/Hepatic   Endo/Other    Renal/GU Renal InsufficiencyRenal disease      Musculoskeletal  (+) Arthritis ,   Abdominal   Peds  Hematology  (+) Blood dyscrasia, anemia ,   Anesthesia Other Findings   Reproductive/Obstetrics                            Anesthesia Physical Anesthesia Plan  ASA: IV  Anesthesia Plan: General   Post-op Pain Management:    Induction: Intravenous  PONV Risk Score and Plan: 3 and Dexamethasone, Ondansetron and Midazolam  Airway Management Planned: Oral ETT  Additional Equipment: Arterial line  Intra-op Plan:   Post-operative Plan: Extubation in OR  Informed Consent: I have reviewed the patients History and Physical, chart, labs and discussed the procedure including the risks, benefits and alternatives for the proposed anesthesia with the patient or authorized representative who has indicated his/her understanding and acceptance.     Dental advisory given  Plan Discussed with: CRNA  Anesthesia Plan Comments: (2nd large bore PIV required  Have magnet available)       Anesthesia Quick Evaluation

## 2019-09-26 NOTE — Progress Notes (Signed)
Dr. Tobias Alexander has been at bedside multiple times and is aware and working with patient. ABG's drawn and taken to lab

## 2019-09-26 NOTE — Addendum Note (Signed)
Addendum  created 09/26/19 1518 by Duane Boston, MD   Order list changed

## 2019-09-26 NOTE — Consult Note (Signed)
Cardiology Consultation:   Patient ID: Morrie Daywalt MRN: [id number]; DOB: 1956-01-17  Admit date: 09/26/2019 Date of Consult: 09/26/2019  Primary Care Provider: Perlie Mayo, NP Primary Cardiologist: Sinclair Grooms, MD  Primary Electrophysiologist:  Lovena Le   Patient Profile:   Seville Downs is a 64 y.o. male with a hx of chronic systolic and diastolic heart failure, status post ICD, LVEF 20 to 25%, CKD 4, hypertension, and CAD status post recent anterior STEMI who is being seen today for the evaluation of acute systolic diastolic heart failure at the request of Dr. Jeffie Pollock.  History of Present Illness:   Mr. Waterson was treated for an anterior STEMI 03/2019.  At that time he underwent mid LAD PCI with a drug-eluting stent.  LVEF was 30 to 35%.  Systolic function did not improve with medical management and he had an ICD implanted 08/2019.  Since then he has been doing well from a cardiac standpoint he reports he had no symptoms prior to admission.  His breathing was stable, his weight was stable, he had no lower extremity edema, orthopnea, or PND.  He presented today for left percutaneous nephrolithotomy for a 3.7 cm stone.  While the patient was in the OR, cardiology was consulted for medication guidance.  Prior to the procedure he was started on lactated Ringer's at 125 mL's per hour.  Thus far he said 2 L of IV fluids since admission.  Cardiology was paged to see the patient acutely because of hypoxia postoperatively.  His only complaints currently are pain at the site of his procedure.  He denies any preceding cough, fever, or chills.  He has been otherwise well.  Thus far he has received 10 mg of IV Lasix.  Chest x-ray was ordered and shows bilateral pulmonary edema.  BNP is pending.   Past Medical History:  Diagnosis Date  . Acute ST elevation myocardial infarction (STEMI) due to occlusion of mid portion of left anterior descending (LAD) coronary artery ([place]) 03/12/2019  .  Acute systolic heart failure ([place])   . Arthritis   . CHF (congestive heart failure) ([place])   . CKD (chronic kidney disease) stage 4, GFR 15-29 ml/min (H[facility])   . Dyspnea    with exertionm periodically  . Heart attack ([place]) 03/15/2019   anterior MI   . History of kidney stones   . Hyperparathyroidism ([place])   . Hypertension 2002  . Ischemic cardiomyopathy   . Left knee pain   . Syncope 04/04/2019   after blood draw  . Vitamin D deficiency     Past Surgical History:  Procedure Laterality Date  . AMPUTATION TOE Left 1991   2 digit of left foot  . CHOLECYSTECTOMY    . COLONOSCOPY    . CORONARY/GRAFT ACUTE MI REVASCULARIZATION N/A 03/12/2019   Procedure: Coronary/Graft Acute MI Revascularization;  Surgeon: Belva Crome, MD;  Location: [facility];  Service: Cardiovascular;  Laterality: N/A;  . ICD IMPLANT N/A 08/24/2019   Procedure: ICD IMPLANT;  Surgeon: Evans Lance, MD;  Location: [facility];  Service: Cardiovascular;  Laterality: N/A;  . IR URETERAL STENT LEFT NEW ACCESS W/O SEP NEPHROSTOMY CATH  09/26/2019  . LEFT HEART CATH AND CORONARY ANGIOGRAPHY N/A 03/12/2019   Procedure: LEFT HEART CATH AND CORONARY ANGIOGRAPHY;  Surgeon: Belva Crome, MD;  Location: [facility];  Service: Cardiovascular;  Laterality: N/A;  . LITHOTRIPSY Right      Home Medications:  Prior to Admission medications  Medication Sig Start Date End Date Taking? Authorizing Provider  allopurinol (ZYLOPRIM) 100 MG tablet Take 100 mg by mouth daily.  04/25/19 06/27/20 Yes [provider]  aspirin 81 MG chewable tablet Chew 1 tablet (81 mg total) by mouth daily. 03/16/19  Yes Cheryln Manly, NP  atorvastatin (LIPITOR) 80 MG tablet Take 1 tablet (80 mg total) by mouth daily at 6 PM. 09/15/19  Yes Fayrene Helper, MD  BIDIL 20-37.5 MG tablet Take 1 tablet by mouth twice daily Patient taking differently: Take 1 tablet by mouth in the morning and at bedtime.  07/18/19  Yes Corum,  Rex Kras, MD  carvedilol (COREG) 12.5 MG tablet TAKE 1 TABLET BY MOUTH TWICE DAILY WITH MEALS Patient taking differently: Take 12.5 mg by mouth 2 (two) times daily with a meal.  08/28/19  Yes Bensimhon, Shaune Pascal, MD  ergocalciferol (VITAMIN D2) 1.25 MG (50000 UT) capsule Take 50,000 Units by mouth every Wednesday.  08/09/19 09/28/19 Yes [provider]  furosemide (LASIX) 40 MG tablet TAKE 1 TABLET BY MOUTH ONCE DAILY AS NEEDED FOR FLUID Patient taking differently: Take 40 mg by mouth daily.  08/28/19  Yes Bensimhon, Shaune Pascal, MD  ivabradine (CORLANOR) 5 MG TABS tablet Take 1 tablet (5 mg total) by mouth 2 (two) times daily with a meal. 08/01/19  Yes Lyda Jester M, PA-C  nitroGLYCERIN (NITROSTAT) 0.4 MG SL tablet Place 1 tablet (0.4 mg total) under the tongue every 5 (five) minutes as needed. 04/11/19  Yes Corum, Rex Kras, MD  sodium bicarbonate 650 MG tablet Take 650 mg by mouth 3 (three) times daily.   Yes [provider]  ticagrelor (BRILINTA) 90 MG TABS tablet Take 1 tablet (90 mg total) by mouth 2 (two) times daily. 05/18/19  Yes Bensimhon, Shaune Pascal, MD    Inpatient Medications: Scheduled Meds: . fentaNYL      . furosemide  10 mg Intravenous Once  . furosemide  40 mg Intravenous Once   Continuous Infusions: . lactated ringers 50 mL/hr at 09/26/19 0744  . lactated ringers 400 mL/hr at 09/26/19 1350   PRN Meds: fentaNYL (SUBLIMAZE) injection, iohexol, ondansetron (ZOFRAN) IV, sodium chloride irrigation  Allergies:   No Known Allergies  Social History:   Social History   Socioeconomic History  . Marital status: Single    Spouse name: Not on file  . Number of children: 0  . Years of education: Not on file  . Highest education level: Some college, no degree  Occupational History  . Occupation: retired  Tobacco Use  . Smoking status: Former Smoker    Packs/day: 0.50    Years: 40.00    Pack years: 20.00    Types: Cigarettes    Quit date: 03/12/2019    Years  since quitting: 0.5  . Smokeless tobacco: Never Used  Substance and Sexual Activity  . Alcohol use: No  . Drug use: No  . Sexual activity: Not Currently  Other Topics Concern  . Not on file  Social History Narrative   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       Enjoys: fishing, shopping, working around the house       Diet: working on changes, avoiding fried foods, increased veggies   Caffeine: teas some daily   Water: 6-8 cups daily       Wears seat belt    Does not use phone while driving    Oceanographer at home    Social Determinants of Health  Financial Resource Strain: Low Risk   . Difficulty of Paying Living Expenses: Not hard at all  Food Insecurity: No Food Insecurity  . Worried About Charity fundraiser in the Last Year: Never true  . Ran Out of Food in the Last Year: Never true  Transportation Needs: No Transportation Needs  . Lack of Transportation (Medical): No  . Lack of Transportation (Non-Medical): No  Physical Activity: Sufficiently Active  . Days of Exercise per Week: 3 days  . Minutes of Exercise per Session: 60 min  Stress: No Stress Concern Present  . Feeling of Stress : Not at all  Social Connections: Somewhat Isolated  . Frequency of Communication with Friends and Family: More than three times a week  . Frequency of Social Gatherings with Friends and Family: More than three times a week  . Attends Religious Services: More than 4 times per year  . Active Member of Clubs or Organizations: No  . Attends Archivist Meetings: Never  . Marital Status: Never married  Intimate Partner Violence: Not At Risk  . Fear of Current or Ex-Partner: No  . Emotionally Abused: No  . Physically Abused: No  . Sexually Abused: No    Family History:    Family History  Problem Relation Age of Onset  . Heart failure Mother   . Heart disease Mother   . Cancer Mother        breast  . Heart attack Father   . Hypertension Father   . Heart disease  Father   . Diabetes Brother      ROS:  Please see the history of present illness.  All other ROS reviewed and negative.     Physical Exam/Data:   Vitals:   09/26/19 1630 09/26/19 1645 09/26/19 1700 09/26/19 1715  BP: (!) 142/101 (!) 147/98 (!) 143/101 (!) 148/105  Pulse: 72 72 71 72  Resp: (!) 25 (!) 22 18 (!) 22  Temp:      TempSrc:      SpO2: (!) 87% (!) 88% (!) 89% 90%  Weight:        Intake/Output Summary (Last 24 hours) at 09/26/2019 1738 Last data filed at 09/26/2019 1700 Gross per 24 hour  Intake 2040.25 ml  Output 775 ml  Net 1265.25 ml   Last 3 Weights 09/26/2019 09/20/2019 09/20/2019  Weight (lbs) 206 lb 4 oz 206 lb 4 oz 198 lb  Weight (kg) 93.554 kg 93.554 kg 89.812 kg     VS:  BP (!) 154/106   Pulse 72   Temp 97.6 F (36.4 C)   Resp 15   Wt 93.6 kg   SpO2 92%   BMI 27.97 kg/m  , BMI Body mass index is 27.97 kg/m. GENERAL:  Ill-appearing.  No respiratory distress on non-rebreather.  Complaining of pain.  HEENT: Pupils equal round and reactive, fundi not visualized, oral mucosa unremarkable NECK:   +HJR to 2 cm above clavicle with abdominal pressure, waveform within normal limits, carotid upstroke brisk and symmetric, no bruits LUNGS:  Diminished at the bases HEART:  RRR.  PMI not displaced or sustained,S1 and S2 within normal limits, no S3, no S4, no clicks, no rubs, III/VI systolic murmur anteriorly ABD:  Flat, positive bowel sounds normal in frequency in pitch, no bruits, no rebound, no guarding, no midline pulsatile mass, no hepatomegaly, no splenomegaly EXT:  2 plus pulses throughout, no edema, no cyanosis no clubbing SKIN:  No rashes no nodules NEURO:  Cranial nerves II  through XII grossly intact, motor grossly intact throughout [facility]:  Cognitively intact, oriented to person place and time   EKG:  The EKG was personally reviewed and demonstrates: n/a Telemetry:  Telemetry was personally reviewed and demonstrates:  n/a  Relevant CV Studies:  Echo  07/05/19: 1. Left ventricular ejection fraction, by estimation, is 20 to 25%. The  left ventricle has severely decreased function. The left ventricle  demonstrates regional wall motion abnormalities (see scoring  diagram/findings for description). The left  ventricular internal cavity size was mildly dilated. Left ventricular  diastolic parameters are indeterminate.  2. Right ventricular systolic function is normal. The right ventricular  size is normal. There is normal pulmonary artery systolic pressure.  3. Left atrial size was severely dilated.  4. The mitral valve is normal in structure and function. Mild mitral  valve regurgitation. No evidence of mitral stenosis.  5. The aortic valve is tricuspid. Aortic valve regurgitation is not  visualized. No aortic stenosis is present.  6. The inferior vena cava is normal in size with greater than 50%  respiratory variability, suggesting right atrial pressure of 3 mmHg.   LHC 03/2019: Diagnostic Dominance: Right  Intervention     Laboratory Data:  High Sensitivity Troponin:  No results for input(s): TROPONINIHS in the last 720 hours.   Chemistry Recent Labs  Lab 09/20/19 0913 09/26/19 0721  NA 141 143  K 4.2 4.0  CL 108 113*  CO2 25 18*  GLUCOSE 113* 102*  BUN 30* 35*  CREATININE 3.22* 3.50*  CALCIUM 10.4* 10.2  GFRNONAA 20* 18*  GFRAA 23* 20*  ANIONGAP 8 12    No results for input(s): PROT, ALBUMIN, AST, ALT, ALKPHOS, BILITOT in the last 168 hours. Hematology Recent Labs  Lab 09/20/19 0913 09/26/19 0721 09/26/19 1456  WBC 6.0 5.8  --   RBC 3.41* 3.28*  --   HGB 11.0* 10.5* 10.7*  HCT 33.8* 32.1* 33.7*  MCV 99.1 97.9  --   MCH 32.3 32.0  --   MCHC 32.5 32.7  --   RDW 13.6 13.5  --   PLT 170 163  --    BNPNo results for input(s): BNP, PROBNP in the last 168 hours.  DDimer No results for input(s): DDIMER in the last 168 hours.   Radiology/Studies:  DG CHEST PORT 1 VIEW  Result Date: 09/26/2019 CLINICAL  DATA:  Hypoxia EXAM: PORTABLE CHEST 1 VIEW COMPARISON:  08/24/2019 FINDINGS: Left-sided single lead pacing device as before. Cardiomegaly with vascular congestion. Extensive interstitial and ground-glass opacity bilaterally suspicious for pulmonary edema. Probable pleural effusions. Basilar consolidations. No pneumothorax. Curvilinear opacity over the right upper quadrant suspect external artifact. IMPRESSION: 1. Cardiomegaly with vascular congestion and extensive bilateral interstitial and ground-glass opacity suspicious for pulmonary edema. Probable pleural effusions. 2. Basilar consolidations may reflect atelectasis or pneumonia. Electronically Signed   By: Donavan Foil M.D.   On: 09/26/2019 17:22   DG C-Arm 1-60 Min-No Report  Result Date: 09/26/2019 Fluoroscopy was utilized by the requesting physician.  No radiographic interpretation.   IR URETERAL STENT LEFT NEW ACCESS W/O SEP NEPHROSTOMY CATH  Result Date: 09/26/2019 INDICATION: Renal stones, access for left percutaneous nephrolithotomy. EXAM: 1. Percutaneous puncture of the renal collecting system under fluoroscopic guidance 2. Placement of a percutaneous nephrostomy tube Operating Physician:  Criselda Peaches, MD COMPARISON:  CT of the abdomen and pelvis-07/11/2019 MEDICATIONS: 2 g Ancef; The antibiotic was administered in an appropriate time frame prior to skin puncture. ANESTHESIA/SEDATION: Fentanyl 200 mcg IV;  Versed 12.5 mg IV Moderate Sedation Time:  32 minutes The patient was continuously monitored during the procedure by the interventional radiology nurse under my direct supervision. CONTRAST:  68mL OMNIPAQUE IOHEXOL 300 MG/ML SOLN - administered into the collecting system(s) FLUOROSCOPY TIME:  Fluoroscopy Time: 7 minutes 42 seconds (182 mGy). COMPLICATIONS: None immediate. PROCEDURE: Informed written consent was obtained from the patient after a thorough discussion of the procedural risks, benefits and alternatives. All questions were  addressed. Maximal Sterile Barrier Technique was utilized including caps, mask, sterile gowns, sterile gloves, sterile drape, hand hygiene and skin antiseptic. A timeout was performed prior to the initiation of the procedure. A pre procedural spot fluoroscopic image was obtained of the upper abdomen. The stone within the upper pole infundibulum was targeted fluoroscopically with a [place] needle. Access to the collecting system was confirmed with advancement of a Nitrex wire into the collecting system. The needle was exchanged for the inner 3 French catheter from an [place] and contrast injection confirmed access. Percutaneous nephrostogram demonstrates marked hydronephrosis of the upper pole collecting system secondary to an obstructing stone mass in the upper pole infundibulum. There are additional stones within calices in the interpolar region of the kidney. The lower pole infundibulum and calices also contain numerous stones, however there is no hydronephrosis. The renal pelvis is very small. A small amount of air was injected into the collecting system to help delineate a posterior calyx. A stone containing interpolar calyx was targeted with a [place] needle. Access to the calyx was confirmed with advancement of a Nitrex wire into the collecting system. An Accustick set was utilized to dilate the tract and was subsequently exchanged for a Kumpe catheter over a Bentson wire. The Kumpe catheter was advanced down the ureter and into the urinary bladder. Postprocedural spot radiographs were obtained in various obliquities and the catheter was sutured to the skin. The catheter was capped and a dressing was placed. The patient tolerated the procedure well without immediate postprocedural complication. IMPRESSION: 1. Obstructing staghorn calculus in the upper pole infundibulum with resultant significant hydronephrosis of the upper pole and interpolar collecting system. 2. Successful percutaneous  access of a stone containing posterior interpolar calyx with advancement of a 5 French catheter past the upper pole infundibulum, through the renal pelvis, down the ureter and into the bladder. Electronically Signed   By: Jacqulynn Cadet M.D.   On: 09/26/2019 14:37   {  Assessment and Plan:   # Acute on chronic systolic and diastolic heart failure: Mr. Mandt seems to have been pretty stable prior to admission.  He developed hypoxia postoperatively.  I suspect this is at least partially due to receiving 2 L of fluid that was needed for his procedure.   We will stop his continuous fluids.  We have already ordered 1 dose of IV Lasix and will continue 40 mg IV twice daily.  Continue his home BiDil.  Reduce carvedilol to 6.25 mg twice daily and hold the ivabradine for now.  This will likely need to start back prior to discharge.  # CAD s/p STEMI: # Hypercholesterolemia: Stable.  OK to hold asprin and ticagrelor for 72 hours as previously discussed.  Continue atorvastatin and reduced carvedilol for now as above.       For questions or updates, please contact [place] Please consult [email or web address] for contact info under     Signed, Skeet Latch, MD  09/26/2019 5:38 PM

## 2019-09-26 NOTE — Anesthesia Procedure Notes (Signed)
Procedure Name: Intubation Date/Time: 09/26/2019 12:12 PM Performed by: Eben Burow, CRNA Pre-anesthesia Checklist: Patient identified, Emergency Drugs available, Suction available, Patient being monitored and Timeout performed Patient Re-evaluated:Patient Re-evaluated prior to induction Oxygen Delivery Method: Circle system utilized Preoxygenation: Pre-oxygenation with 100% oxygen Induction Type: IV induction Ventilation: Mask ventilation without difficulty Laryngoscope Size: Mac and 4 Grade View: Grade I Tube type: Oral Tube size: 7.5 mm Number of attempts: 1 Airway Equipment and Method: Stylet Placement Confirmation: ETT inserted through vocal cords under direct vision,  positive ETCO2 and breath sounds checked- equal and bilateral Secured at: 23 cm Tube secured with: Tape Dental Injury: Teeth and Oropharynx as per pre-operative assessment

## 2019-09-26 NOTE — Progress Notes (Signed)
Day of Surgery Subjective: Patient very sleepy but complaining of pain. Received pain meds approx 20 min prior to eval.  On NRB with good sats.    Objective: Vital signs in last 24 hours: Temp:  [97.8 F (36.6 C)] 97.8 F (36.6 C) (05/25 0738) Pulse Rate:  [60-68] 60 (05/25 1124) Resp:  [13-21] 14 (05/25 1124) BP: (115-136)/(82-108) 136/96 (05/25 1124) SpO2:  [92 %-100 %] 95 % (05/25 1124) Weight:  [93.6 kg] 93.6 kg (05/25 0738)  Intake/Output from previous day: No intake/output data recorded. Intake/Output this shift: Total I/O In: 1440.3 [I.V.:1440.3] Out: 275 [Urine:250; Blood:25]  Physical Exam:  General: sleepy  GI: soft  Perc site C/D/I with minimal output Foley with pink urine   Lab Results: Recent Labs    09/26/19 0721  HGB 10.5*  HCT 32.1*   BMET Recent Labs    09/26/19 0721  NA 143  K 4.0  CL 113*  CO2 18*  GLUCOSE 102*  BUN 35*  CREATININE 3.50*  CALCIUM 10.2   Recent Labs    09/26/19 0721  INR 1.0   No results for input(s): LABURIN in the last 72 hours. Results for orders placed or performed during the hospital encounter of 09/22/19  SARS CORONAVIRUS 2 (TAT 6-24 HRS) Nasopharyngeal Nasopharyngeal Swab     Status: None   Collection Time: 09/22/19  9:31 AM   Specimen: Nasopharyngeal Swab  Result Value Ref Range Status   SARS Coronavirus 2 NEGATIVE NEGATIVE Final    Comment: (NOTE) SARS-CoV-2 target nucleic acids are NOT DETECTED. The SARS-CoV-2 RNA is generally detectable in upper and lower respiratory specimens during the acute phase of infection. Negative results do not preclude SARS-CoV-2 infection, do not rule out co-infections with other pathogens, and should not be used as the sole basis for treatment or other patient management decisions. Negative results must be combined with clinical observations, patient history, and epidemiological information. The expected result is Negative. Fact Sheet for  Patients: SugarRoll.be Fact Sheet for Healthcare Providers: https://www.woods-mathews.com/ This test is not yet approved or cleared by the [place] FDA and  has been authorized for detection and/or diagnosis of SARS-CoV-2 by FDA under an Emergency Use Authorization (EUA). This EUA will remain  in effect (meaning this test can be used) for the duration of the COVID-19 declaration under Section 56 4(b)(1) of the Act, 21 U.S.C. section 360bbb-3(b)(1), unless the authorization is terminated or revoked sooner. Performed at [facility], [place] [address], [place],  [address]     Studies/Results: DG C-Arm 1-60 Min-No Report  Result Date: 09/26/2019 Fluoroscopy was utilized by the requesting physician.  No radiographic interpretation.   IR URETERAL STENT LEFT NEW ACCESS W/O SEP NEPHROSTOMY CATH  Result Date: 09/26/2019 INDICATION: Renal stones, access for left percutaneous nephrolithotomy. EXAM: 1. Percutaneous puncture of the renal collecting system under fluoroscopic guidance 2. Placement of a percutaneous nephrostomy tube Operating Physician:  Criselda Peaches, MD COMPARISON:  CT of the abdomen and pelvis-07/11/2019 MEDICATIONS: 2 g Ancef; The antibiotic was administered in an appropriate time frame prior to skin puncture. ANESTHESIA/SEDATION: Fentanyl 200 mcg IV; Versed 12.5 mg IV Moderate Sedation Time:  32 minutes The patient was continuously monitored during the procedure by the interventional radiology nurse under my direct supervision. CONTRAST:  58mL OMNIPAQUE IOHEXOL 300 MG/ML SOLN - administered into the collecting system(s) FLUOROSCOPY TIME:  Fluoroscopy Time: 7 minutes 42 seconds (182 mGy). COMPLICATIONS: None immediate. PROCEDURE: Informed written consent was obtained from the patient after a  thorough discussion of the procedural risks, benefits and alternatives. All questions were addressed. Maximal Sterile Barrier Technique  was utilized including caps, mask, sterile gowns, sterile gloves, sterile drape, hand hygiene and skin antiseptic. A timeout was performed prior to the initiation of the procedure. A pre procedural spot fluoroscopic image was obtained of the upper abdomen. The stone within the upper pole infundibulum was targeted fluoroscopically with a [place] needle. Access to the collecting system was confirmed with advancement of a Nitrex wire into the collecting system. The needle was exchanged for the inner 3 French catheter from an [place] and contrast injection confirmed access. Percutaneous nephrostogram demonstrates marked hydronephrosis of the upper pole collecting system secondary to an obstructing stone mass in the upper pole infundibulum. There are additional stones within calices in the interpolar region of the kidney. The lower pole infundibulum and calices also contain numerous stones, however there is no hydronephrosis. The renal pelvis is very small. A small amount of air was injected into the collecting system to help delineate a posterior calyx. A stone containing interpolar calyx was targeted with a [place] needle. Access to the calyx was confirmed with advancement of a Nitrex wire into the collecting system. An Accustick set was utilized to dilate the tract and was subsequently exchanged for a Kumpe catheter over a Bentson wire. The Kumpe catheter was advanced down the ureter and into the urinary bladder. Postprocedural spot radiographs were obtained in various obliquities and the catheter was sutured to the skin. The catheter was capped and a dressing was placed. The patient tolerated the procedure well without immediate postprocedural complication. IMPRESSION: 1. Obstructing staghorn calculus in the upper pole infundibulum with resultant significant hydronephrosis of the upper pole and interpolar collecting system. 2. Successful percutaneous access of a stone containing posterior  interpolar calyx with advancement of a 5 French catheter past the upper pole infundibulum, through the renal pelvis, down the ureter and into the bladder. Electronically Signed   By: Jacqulynn Cadet M.D.   On: 09/26/2019 14:37    Assessment/Plan: Day of Surgery Procedure(s) (LRB): LEFT NEPHROLITHOTOMY PERCUTANEOUS (Left)  Stable  BP elevation at least partially due to pain.  Get pain control and monitor.  Cardiology to eval due to decreased EF.  Will defer medications to them.  Check H/H    LOS: 0 days   Debbrah Alar 09/26/2019, 3:02 PM

## 2019-09-26 NOTE — Transfer of Care (Signed)
Immediate Anesthesia Transfer of Care Note  Patient: Geo Slone  Procedure(s) Performed: LEFT NEPHROLITHOTOMY PERCUTANEOUS (Left Back)  Patient Location: PACU  Anesthesia Type:General  Level of Consciousness: awake, drowsy and patient cooperative  Airway & Oxygen Therapy: Patient Spontanous Breathing and Patient connected to face mask oxygen  Post-op Assessment: Report given to RN and Post -op Vital signs reviewed and stable  Post vital signs: Reviewed and stable  Last Vitals:  Vitals Value Taken Time  BP 144/114 09/26/19 1407  Temp    Pulse 66 09/26/19 1414  Resp 14 09/26/19 1414  SpO2 88 % 09/26/19 1414  Vitals shown include unvalidated device data.  Last Pain:  Vitals:   09/26/19 1124  TempSrc:   PainSc: 0-No pain         Complications: No apparent anesthesia complications

## 2019-09-26 NOTE — Anesthesia Postprocedure Evaluation (Signed)
Anesthesia Post Note  Patient: [name]  Procedure(s) Performed: LEFT NEPHROLITHOTOMY PERCUTANEOUS (Left Back)     Patient location during evaluation: PACU Anesthesia Type: General Level of consciousness: awake and alert Pain management: pain level controlled Vital Signs Assessment: post-procedure vital signs reviewed and stable Respiratory status: spontaneous breathing, nonlabored ventilation and respiratory function stable Cardiovascular status: blood pressure returned to baseline and stable Postop Assessment: no apparent nausea or vomiting Anesthetic complications: no    Last Vitals:  Vitals:   09/26/19 0738 09/26/19 1124  BP: 118/82 (!) 136/96  Pulse: 66 60  Resp: 17 14  Temp: 36.6 C   SpO2: 100% 95%    Last Pain:  Vitals:   09/26/19 1124  TempSrc:   PainSc: 0-No pain                 Catalina Gravel

## 2019-09-26 NOTE — H&P (Signed)
Chief Complaint: Patient was seen in consultation today for left percutaneous nephrostomy/nephroureteral catheter.  Referring Physician(s): Irine Seal  Supervising Physician: Jacqulynn Cadet  Patient Status: [facility] - Out-pt  History of Present Illness: [name] is a 64 y.o. male with a past medical history of a STEMI with multiple stent placement (03/2019), ischemic cardiomyopathy (AICD placed 08/2019), CHF, CKD stage 4 and kidney stones/lithotripsy. He has bilateral stones but he presents today for treatment of multiple left renal staghorn stones.  CT renal stone study 07/11/2019: Numerous nonobstructive calculi in the collecting systems of both kidneys, several of which are staghorn in appearance, largest of which is in the lower pole of the left kidney measuring 1.9 x 1.4 cm. In addition, there is probable upper pole caliectasis in the left kidney likely related to obstruction from an upper pole calculus measuring 1.7 x 1.8 cm.   Interventional Radiology has been asked to evaluate this patient for the placement of a left percutaneous nephrostomy/nephroureteral catheter prior to his surgery with Dr. Jeffie Pollock today.    Past Medical History:  Diagnosis Date  . Acute ST elevation myocardial infarction (STEMI) due to occlusion of mid portion of left anterior descending (LAD) coronary artery ([place]) 03/12/2019  . Acute systolic heart failure ([place])   . Arthritis   . CHF (congestive heart failure) ([place])   . CKD (chronic kidney disease) stage 4, GFR 15-29 ml/min (HCC)   . Dyspnea    with exertionm periodically  . Heart attack ([place]) 03/15/2019   anterior MI   . History of kidney stones   . Hyperparathyroidism ([place])   . Hypertension 2002  . Ischemic cardiomyopathy   . Left knee pain   . Syncope 04/04/2019   after blood draw  . Vitamin D deficiency     Past Surgical History:  Procedure Laterality Date  . AMPUTATION TOE Left 1991   2 digit of left foot  . CHOLECYSTECTOMY    .  COLONOSCOPY    . CORONARY/GRAFT ACUTE MI REVASCULARIZATION N/A 03/12/2019   Procedure: Coronary/Graft Acute MI Revascularization;  Surgeon: Belva Crome, MD;  Location: [place] [facility];  Service: Cardiovascular;  Laterality: N/A;  . ICD IMPLANT N/A 08/24/2019   Procedure: ICD IMPLANT;  Surgeon: Evans Lance, MD;  Location: [place] [facility];  Service: Cardiovascular;  Laterality: N/A;  . LEFT HEART CATH AND CORONARY ANGIOGRAPHY N/A 03/12/2019   Procedure: LEFT HEART CATH AND CORONARY ANGIOGRAPHY;  Surgeon: Belva Crome, MD;  Location: [place] [facility];  Service: Cardiovascular;  Laterality: N/A;  . LITHOTRIPSY Right     Allergies: Patient has no known allergies.  Medications: Prior to Admission medications   Medication Sig Start Date End Date Taking? Authorizing Provider  allopurinol (ZYLOPRIM) 100 MG tablet Take 100 mg by mouth daily.  04/25/19 06/27/20  [provider]  aspirin 81 MG chewable tablet Chew 1 tablet (81 mg total) by mouth daily. 03/16/19   Cheryln Manly, NP  atorvastatin (LIPITOR) 80 MG tablet Take 1 tablet (80 mg total) by mouth daily at 6 PM. 09/15/19   Fayrene Helper, MD  BIDIL 20-37.5 MG tablet Take 1 tablet by mouth twice daily Patient taking differently: Take 1 tablet by mouth in the morning and at bedtime.  07/18/19   Corum, Rex Kras, MD  carvedilol (COREG) 12.5 MG tablet TAKE 1 TABLET BY MOUTH TWICE DAILY WITH MEALS Patient taking differently: Take 12.5 mg by mouth 2 (two) times daily with a meal.  08/28/19  Bensimhon, Shaune Pascal, MD  ergocalciferol (VITAMIN D2) 1.25 MG (50000 UT) capsule Take 50,000 Units by mouth every Wednesday.  08/09/19 09/28/19  [provider]  furosemide (LASIX) 40 MG tablet TAKE 1 TABLET BY MOUTH ONCE DAILY AS NEEDED FOR FLUID Patient taking differently: Take 40 mg by mouth daily.  08/28/19   Bensimhon, Shaune Pascal, MD  ivabradine (CORLANOR) 5 MG TABS tablet Take 1 tablet (5 mg total) by mouth 2 (two) times daily with  a meal. 08/01/19   Lyda Jester M, PA-C  nitroGLYCERIN (NITROSTAT) 0.4 MG SL tablet Place 1 tablet (0.4 mg total) under the tongue every 5 (five) minutes as needed. 04/11/19   Corum, Rex Kras, MD  sodium bicarbonate 650 MG tablet Take 650 mg by mouth 3 (three) times daily.    [provider]  ticagrelor (BRILINTA) 90 MG TABS tablet Take 1 tablet (90 mg total) by mouth 2 (two) times daily. 05/18/19   Bensimhon, Shaune Pascal, MD     Family History  Problem Relation Age of Onset  . Heart failure Mother   . Heart disease Mother   . Cancer Mother        breast  . Heart attack Father   . Hypertension Father   . Heart disease Father   . Diabetes Brother     Social History   Socioeconomic History  . Marital status: Single    Spouse name: Not on file  . Number of children: 0  . Years of education: Not on file  . Highest education level: Some college, no degree  Occupational History  . Occupation: retired  Tobacco Use  . Smoking status: Former Smoker    Packs/day: 0.50    Years: 40.00    Pack years: 20.00    Types: Cigarettes    Quit date: 03/12/2019    Years since quitting: 0.5  . Smokeless tobacco: Never Used  Substance and Sexual Activity  . Alcohol use: No  . Drug use: No  . Sexual activity: Not Currently  Other Topics Concern  . Not on file  Social History Narrative   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       Enjoys: fishing, shopping, working around the house       Diet: working on changes, avoiding fried foods, increased veggies   Caffeine: teas some daily   Water: 6-8 cups daily       Wears seat belt    Does not use phone while driving    Oceanographer at home    Social Determinants of Health   Financial Resource Strain: Low Risk   . Difficulty of Paying Living Expenses: Not hard at all  Food Insecurity: No Food Insecurity  . Worried About Charity fundraiser in the Last Year: Never true  . Ran Out of Food in the Last Year: Never true   Transportation Needs: No Transportation Needs  . Lack of Transportation (Medical): No  . Lack of Transportation (Non-Medical): No  Physical Activity: Sufficiently Active  . Days of Exercise per Week: 3 days  . Minutes of Exercise per Session: 60 min  Stress: No Stress Concern Present  . Feeling of Stress : Not at all  Social Connections: Somewhat Isolated  . Frequency of Communication with Friends and Family: More than three times a week  . Frequency of Social Gatherings with Friends and Family: More than three times a week  . Attends Religious Services: More than 4 times per year  .  Active Member of Clubs or Organizations: No  . Attends Archivist Meetings: Never  . Marital Status: Never married      Review of Systems: A 12 point ROS discussed and pertinent positives are indicated in the HPI above.  All other systems are negative.  Review of Systems  Respiratory: Negative for cough and shortness of breath.   Cardiovascular: Positive for leg swelling. Negative for chest pain and palpitations.  Gastrointestinal: Negative for abdominal distention, abdominal pain, diarrhea, nausea and vomiting.  Genitourinary: Negative for dysuria and flank pain.  Musculoskeletal: Negative for back pain.  Neurological: Negative for headaches.  Hematological: Does not bruise/bleed easily.    Vital Signs: Temp: 97.8, BP 118/82, HR 66, RR 17, 100% Room Air.   Physical Exam Constitutional:      General: He is not in acute distress.    Appearance: Normal appearance.  HENT:     Mouth/Throat:     Mouth: Mucous membranes are moist.     Comments: edentulous Cardiovascular:     Rate and Rhythm: Normal rate and regular rhythm.     Pulses: Normal pulses.     Heart sounds: Normal heart sounds.     Comments: Left upper chest AICD Pulmonary:     Effort: Pulmonary effort is normal.     Breath sounds: Normal breath sounds.  Abdominal:     General: Bowel sounds are normal. There is no  distension.     Palpations: Abdomen is soft.     Tenderness: There is no abdominal tenderness.  Skin:    General: Skin is warm and dry.  Neurological:     Mental Status: He is alert and oriented to person, place, and time.  Psychiatric:        Mood and Affect: Mood normal.        Behavior: Behavior normal.        Thought Content: Thought content normal.        Judgment: Judgment normal.     Imaging: No results found.  Labs:  CBC: Recent Labs    07/05/19 1113 07/24/19 0922 09/20/19 0913 09/26/19 0721  WBC 6.4 6.6 6.0 5.8  HGB 11.4* 11.1* 11.0* 10.5*  HCT 35.5* 34.5* 33.8* 32.1*  PLT 248 230 170 163    COAGS: Recent Labs    09/26/19 0721  INR 1.0    BMP: Recent Labs    07/24/19 0922 08/01/19 0927 09/20/19 0913 09/26/19 0721  NA 144 143 141 143  K 4.0 4.0 4.2 4.0  CL 110 112* 108 113*  CO2 25 22 25  18*  GLUCOSE 106* 88 113* 102*  BUN 32* 34* 30* 35*  CALCIUM 10.2 10.0 10.4* 10.2  CREATININE 2.78* 2.78* 3.22* 3.50*  GFRNONAA 23* 23* 20* 18*  GFRAA 27* 27* 23* 20*    LIVER FUNCTION TESTS: Recent Labs    03/14/19 0245 04/05/19 0459  BILITOT 1.3* 0.6  AST 209* 15  ALT 50* 14  ALKPHOS 70 85  PROT 6.2* 6.5  ALBUMIN 3.0* 2.6*    TUMOR MARKERS: No results for input(s): AFPTM, CEA, CA199, CHROMGRNA in the last 8760 hours.  Assessment and Plan: [name] is a 64 y.o. male with a past medical history that includes CKD stage 4 and kidney stones/lithotripsy. He has bilateral stones but he presents today for treatment of multiple left renal staghorn stones.  Dr. Laurence Ferrari will see this patient today for the placement of a left percutaneous nephrostomy/nephroureteral catheter prior to his surgery with Dr. Jeffie Pollock  today.  Risks and benefits of a left percutaneous nephrostomy/nephroureteral catheter was discussed with the patient including, but not limited to, infection, bleeding, significant bleeding causing loss or decrease in renal function or damage  to adjacent structures.   The patient does take Brilinta, last dose 09/21/2019.    All of the patient's questions were answered, patient is agreeable to proceed.  Consent signed and in chart.  Thank you for this interesting consult.  I greatly enjoyed meeting [name] and look forward to participating in their care.  A copy of this report was sent to the requesting provider on this date.  Electronically Signed: Theresa Duty, NP 09/26/2019, 8:41 AM   I spent a total of  30 Minutes   in face to face in clinical consultation, greater than 50% of which was counseling/coordinating care for a left percutaneous nephrostomy/nephroureteral catheter.

## 2019-09-26 NOTE — Interval H&P Note (Signed)
History and Physical Interval Note:  We have cardiac clearance and the tube was placed today.    09/26/2019 11:37 AM  [name]  has presented today for surgery, with the diagnosis of LEFT STAGHORN STONE.  The various methods of treatment have been discussed with the patient and family. After consideration of risks, benefits and other options for treatment, the patient has consented to  Procedure(s): LEFT NEPHROLITHOTOMY PERCUTANEOUS (Left) as a surgical intervention.  The patient's history has been reviewed, patient examined, no change in status, stable for surgery.  I have reviewed the patient's chart and labs.  Questions were answered to the patient's satisfaction.     Irine Seal

## 2019-09-26 NOTE — Telephone Encounter (Signed)
-----   Message from Irine Seal, MD sent at 09/26/2019  1:55 PM EDT ----- I will need to squeeze [name] in on Friday to remove his nephrostomy tube.

## 2019-09-26 NOTE — Procedures (Signed)
Interventional Radiology Procedure Note  Procedure: Successful access of interpolar posterior calyx with placement of a 2F catheter past the pelvic stone, down the ureter and into the bladder.   Complications: None  Estimated Blood Loss: None  Recommendations: - To OR for PCNL.    Signed,  Criselda Peaches, MD

## 2019-09-26 NOTE — Progress Notes (Signed)
From: Jolaine Artist, MD  Sent: 09/26/2019 12:29 AM EDT  To: Irine Seal, MD  Subject: RE: clearance                   I would consider him moderate to high risk for CV issues with this but sounds like there is not much choice and he needs to proceed.  ----- Message -----  From: Irine Seal, MD  Sent: 09/25/2019  8:35 PM EDT  To: Jolaine Artist, MD  Subject: RE: clearance                   Would be 2-3 hrs under GA  ----- Message -----  From: Jolaine Artist, MD  Sent: 09/25/2019  7:39 PM EDT  To: Irine Seal, MD, Evans Lance, MD  Subject: RE: clearance                   From my point of view at least moderate risk for CV complications but as long as not prolonged procedure and if general anesthesia can be avoided should be ok  ----- Message -----  From: Irine Seal, MD  Sent: 09/25/2019  4:43 PM EDT  To: Jolaine Artist, MD, Evans Lance, MD  Subject: clearance                     We had discussed this fellow prior to his ICD placment a few weeks ago. I have him on for a PCNL tomorrow, but don't see a final cardiac clearance probably because I circumvented the usual process with direct messages, but is he ok to go ahead with his surgery tomorrow?   Thanks,   Jenny Reichmann

## 2019-09-26 NOTE — Telephone Encounter (Signed)
Called pt and left message of appt time and date for Friday 5/28//21

## 2019-09-26 NOTE — Anesthesia Procedure Notes (Signed)
Arterial Line Insertion Start/End5/25/2021 11:40 AM, 09/26/2019 11:47 AM Performed by: Catalina Gravel, MD, anesthesiologist  Patient location: Pre-op. Preanesthetic checklist: patient identified, IV checked, site marked, risks and benefits discussed, surgical consent, monitors and equipment checked, pre-op evaluation, timeout performed and anesthesia consent Lidocaine 1% used for infiltration Right, radial was placed Catheter size: 20 Fr Hand hygiene performed  and maximum sterile barriers used   Attempts: 1 Procedure performed using ultrasound guided technique. Ultrasound Notes:anatomy identified, needle tip was noted to be adjacent to the nerve/plexus identified, no ultrasound evidence of intravascular and/or intraneural injection and image(s) printed for medical record Following insertion, dressing applied. Post procedure assessment: normal and unchanged  Patient tolerated the procedure well with no immediate complications. Additional procedure comments: Attempt x2 by CRNA, attempt x1 by MDA with US guidance.Marland Kitchen

## 2019-09-26 NOTE — Discharge Instructions (Signed)
Percutaneous Nephrolithotomy, Care After This sheet gives you information about how to care for yourself after your procedure. Your health care provider may also give you more specific instructions. If you have problems or questions, contact your health care provider. What can I expect after the procedure? After the procedure, it is common to have:  Soreness or pain.  A small amount of blood or clear fluid coming from your incision for a few days.  Fatigue.  Some blood in your urine. This will last for a few days. Follow these instructions at home: Medicines  Take over-the-counter and prescription medicines only as told by your health care provider.  If you were prescribed an antibiotic medicine, take it as told by your health care provider. Do not stop using the antibiotic even if you start to feel better.  Ask your health care provider if the medicine prescribed to you: ? Requires you to avoid driving or using heavy machinery. ? Can cause constipation. You may need to take these actions to prevent or treat constipation:  Drink enough fluid to keep your urine pale yellow.  Take over-the-counter or prescription medicines.  Eat foods that are high in fiber, such as beans, whole grains, and fresh fruits and vegetables.  Limit foods that are high in fat and processed sugars, such as fried or sweet foods. Incision care   Follow instructions from your health care provider about how to take care of your incision. Make sure you: ? Wash your hands with soap and water before and after you change your bandage (dressing). If soap and water are not available, use hand sanitizer. ? Change your dressing as told by your health care provider. ? Leave stitches (sutures), skin glue, or adhesive strips in place. These skin closures may need to stay in place for 2 weeks or longer. If adhesive strip edges start to loosen and curl up, you may trim the loose edges. Do not remove adhesive strips  completely unless your health care provider tells you to do that.  Check your incision area every day for signs of infection. Check for: ? Redness, swelling, or pain. ? More fluid or blood. ? Warmth. ? Pus or a bad smell.  Do not take baths, swim, or use a hot tub until your health care provider approves. Ask your health care provider if you may take showers. You may only be allowed to take sponge baths. Activity  Avoid strenuous activities for as long as told by your health care provider.  Return to your normal activities as told by your health care provider. Ask your health care provider what activities are safe for you. General instructions  If you were sent home with a catheter or surgical drain (nephrostomy tube), follow your health care provider's instructions on how to take care of it.  Wear compression stockings as told by your health care provider. These stockings help to prevent blood clots and reduce swelling in your legs.  Do not use any products that contain nicotine or tobacco, such as cigarettes, e-cigarettes, and chewing tobacco. These can delay incision healing after surgery. If you need help quitting, ask your health care provider.  Keep all follow-up visits as told by your health care provider. This is important. ? If you still have a stent, your health care provider will need to remove it after 1-2 weeks. Contact a health care provider if:  You have a fever.  You have redness, swelling, or pain around your incision.  You have more  fluid or blood coming from your incision.  Your incision feels warm to the touch.  You have pus or a bad smell coming from your incision.  You lose your appetite.  You feel nauseous or you vomit.  You have been sent home with a urinary catheter or a surgical drain and urine flow suddenly stops, followed by kidney pain. Get help right away if:  You notice a large amount of blood in your urine.  You have blood clots in your  urine.  You cannot urinate.  You have chest pain or trouble breathing. Summary  After the procedure, it is common to feel soreness and discomfort. You may also see blood in your urine.  You will be told how to care for yourself after the procedure. Follow instructions on how to care for your incision and how to recognize signs of infection.  Avoid activities that require great effort. Return to activities as told by your health care provider.  Get help right away if you have blood clots in your urine, you cannot urinate, or you have trouble breathing.  We will set you up to come to the office for removal of the tube later this week.  Please hold the Brilinta and Aspirin for 3 days following your surgery.  This information is not intended to replace advice given to you by your health care provider. Make sure you discuss any questions you have with your health care provider. Document Revised: 11/10/2017 Document Reviewed: 11/10/2017 Elsevier Patient Education  [place].

## 2019-09-27 ENCOUNTER — Encounter: Payer: Self-pay | Admitting: *Deleted

## 2019-09-27 ENCOUNTER — Observation Stay (HOSPITAL_COMMUNITY): Payer: Medicare HMO

## 2019-09-27 DIAGNOSIS — Z7902 Long term (current) use of antithrombotics/antiplatelets: Secondary | ICD-10-CM | POA: Diagnosis not present

## 2019-09-27 DIAGNOSIS — E872 Acidosis: Secondary | ICD-10-CM | POA: Diagnosis not present

## 2019-09-27 DIAGNOSIS — N184 Chronic kidney disease, stage 4 (severe): Secondary | ICD-10-CM | POA: Diagnosis present

## 2019-09-27 DIAGNOSIS — N200 Calculus of kidney: Secondary | ICD-10-CM | POA: Diagnosis not present

## 2019-09-27 DIAGNOSIS — Z7982 Long term (current) use of aspirin: Secondary | ICD-10-CM | POA: Diagnosis not present

## 2019-09-27 DIAGNOSIS — I2510 Atherosclerotic heart disease of native coronary artery without angina pectoris: Secondary | ICD-10-CM | POA: Diagnosis present

## 2019-09-27 DIAGNOSIS — I97131 Postprocedural heart failure following other surgery: Secondary | ICD-10-CM | POA: Diagnosis not present

## 2019-09-27 DIAGNOSIS — E7800 Pure hypercholesterolemia, unspecified: Secondary | ICD-10-CM | POA: Diagnosis present

## 2019-09-27 DIAGNOSIS — I5043 Acute on chronic combined systolic (congestive) and diastolic (congestive) heart failure: Secondary | ICD-10-CM | POA: Diagnosis not present

## 2019-09-27 DIAGNOSIS — I252 Old myocardial infarction: Secondary | ICD-10-CM | POA: Diagnosis not present

## 2019-09-27 DIAGNOSIS — Z89422 Acquired absence of other left toe(s): Secondary | ICD-10-CM | POA: Diagnosis not present

## 2019-09-27 DIAGNOSIS — I5041 Acute combined systolic (congestive) and diastolic (congestive) heart failure: Secondary | ICD-10-CM | POA: Diagnosis not present

## 2019-09-27 DIAGNOSIS — E785 Hyperlipidemia, unspecified: Secondary | ICD-10-CM | POA: Diagnosis present

## 2019-09-27 DIAGNOSIS — Z955 Presence of coronary angioplasty implant and graft: Secondary | ICD-10-CM | POA: Diagnosis not present

## 2019-09-27 DIAGNOSIS — Z833 Family history of diabetes mellitus: Secondary | ICD-10-CM | POA: Diagnosis not present

## 2019-09-27 DIAGNOSIS — I130 Hypertensive heart and chronic kidney disease with heart failure and stage 1 through stage 4 chronic kidney disease, or unspecified chronic kidney disease: Secondary | ICD-10-CM | POA: Diagnosis present

## 2019-09-27 DIAGNOSIS — N2581 Secondary hyperparathyroidism of renal origin: Secondary | ICD-10-CM | POA: Diagnosis present

## 2019-09-27 DIAGNOSIS — R0602 Shortness of breath: Secondary | ICD-10-CM | POA: Diagnosis not present

## 2019-09-27 DIAGNOSIS — Z87442 Personal history of urinary calculi: Secondary | ICD-10-CM | POA: Diagnosis not present

## 2019-09-27 DIAGNOSIS — J810 Acute pulmonary edema: Secondary | ICD-10-CM | POA: Diagnosis not present

## 2019-09-27 DIAGNOSIS — Z95810 Presence of automatic (implantable) cardiac defibrillator: Secondary | ICD-10-CM | POA: Diagnosis not present

## 2019-09-27 DIAGNOSIS — D631 Anemia in chronic kidney disease: Secondary | ICD-10-CM | POA: Diagnosis present

## 2019-09-27 DIAGNOSIS — Z8249 Family history of ischemic heart disease and other diseases of the circulatory system: Secondary | ICD-10-CM | POA: Diagnosis not present

## 2019-09-27 DIAGNOSIS — E559 Vitamin D deficiency, unspecified: Secondary | ICD-10-CM | POA: Diagnosis present

## 2019-09-27 DIAGNOSIS — J9601 Acute respiratory failure with hypoxia: Secondary | ICD-10-CM | POA: Diagnosis not present

## 2019-09-27 DIAGNOSIS — I509 Heart failure, unspecified: Secondary | ICD-10-CM | POA: Diagnosis not present

## 2019-09-27 DIAGNOSIS — Z79899 Other long term (current) drug therapy: Secondary | ICD-10-CM | POA: Diagnosis not present

## 2019-09-27 DIAGNOSIS — I255 Ischemic cardiomyopathy: Secondary | ICD-10-CM | POA: Diagnosis present

## 2019-09-27 DIAGNOSIS — N132 Hydronephrosis with renal and ureteral calculous obstruction: Secondary | ICD-10-CM | POA: Diagnosis present

## 2019-09-27 DIAGNOSIS — Y838 Other surgical procedures as the cause of abnormal reaction of the patient, or of later complication, without mention of misadventure at the time of the procedure: Secondary | ICD-10-CM | POA: Diagnosis not present

## 2019-09-27 LAB — BASIC METABOLIC PANEL
Anion gap: 10 (ref 5–15)
BUN: 42 mg/dL — ABNORMAL HIGH (ref 8–23)
CO2: 18 mmol/L — ABNORMAL LOW (ref 22–32)
Calcium: 9 mg/dL (ref 8.9–10.3)
Chloride: 110 mmol/L (ref 98–111)
Creatinine, Ser: 3.48 mg/dL — ABNORMAL HIGH (ref 0.61–1.24)
GFR calc Af Amer: 21 mL/min — ABNORMAL LOW (ref >60)
GFR calc non Af Amer: 18 mL/min — ABNORMAL LOW (ref >60)
Glucose, Bld: 130 mg/dL — ABNORMAL HIGH (ref 70–99)
Potassium: 4.8 mmol/L (ref 3.5–5.1)
Sodium: 138 mmol/L (ref 135–145)

## 2019-09-27 LAB — HEMOGLOBIN AND HEMATOCRIT, BLOOD
HCT: 31.7 % — ABNORMAL LOW (ref 39.0–52.0)
Hemoglobin: 10.1 g/dL — ABNORMAL LOW (ref 13.0–17.0)

## 2019-09-27 MED ORDER — ACETAMINOPHEN 10 MG/ML IV SOLN
INTRAVENOUS | Status: AC
  Filled 2019-09-27: qty 100

## 2019-09-27 MED ORDER — HYDROMORPHONE HCL 1 MG/ML IJ SOLN
INTRAMUSCULAR | Status: AC
  Filled 2019-09-27: qty 1

## 2019-09-27 MED ORDER — CEFAZOLIN SODIUM-DEXTROSE 1-4 GM/50ML-% IV SOLN
INTRAVENOUS | Status: AC
  Administered 2019-09-27: 1 g via INTRAVENOUS
  Filled 2019-09-27: qty 50

## 2019-09-27 MED ORDER — ACETAMINOPHEN 10 MG/ML IV SOLN
INTRAVENOUS | Status: AC
  Administered 2019-09-27: 1000 mg via INTRAVENOUS
  Filled 2019-09-27: qty 100

## 2019-09-27 MED ORDER — FUROSEMIDE 10 MG/ML IJ SOLN
INTRAMUSCULAR | Status: AC
  Filled 2019-09-27: qty 4

## 2019-09-27 MED ORDER — CHLORHEXIDINE GLUCONATE CLOTH 2 % EX PADS: 6.0000 | MEDICATED_PAD | Freq: Every day | CUTANEOUS | Status: DC

## 2019-09-27 MED ORDER — CEFAZOLIN SODIUM-DEXTROSE 1-4 GM/50ML-% IV SOLN
INTRAVENOUS | Status: AC
  Filled 2019-09-27: qty 50

## 2019-09-27 NOTE — Progress Notes (Signed)
NAME:  [name], MRN:  [id number], DOB:  Apr 21, 1956, LOS: 0 ADMISSION DATE:  09/26/2019, CONSULTATION DATE:  09/26/2019 REFERRING MD:  Dr. Jeffie Pollock, Urology, CHIEF COMPLAINT:  Hypoxia   Brief History   64 yo male with CKD 4 and Lt renal staghorn calculus had Lt percutaneous nephrolithotomy on 09/26/19.  Post op he developed hypoxia from acute CHF exacerbation and PCCM asked to assess.  Past Medical History  Vit D deficiency, CAD s/p CABG, Ischemic CM, s/p AICD, HTN, Secondary hyperparathyroidism, Nephrolithiasis, CKD 4  Significant Hospital Events   5/25 Lt percutaneous nephrolithotomy, hypoxia after procedure 5/26 transfer to telemetry  Consults:  Cardiology  Procedures:    Significant Diagnostic Tests:  Echo 07/05/19 >> EF 20 to 25%, severe LA dilation, mild MR  Micro Data:    Antimicrobials:    Interim history/subjective:  Breathing better.  On nasal cannula.  Denies chest pain, cough.  Wants something more solid to eat.  Objective   Blood pressure (!) 129/93, pulse 79, temperature 98 F (36.7 C), resp. rate 20, weight 93.6 kg, SpO2 92 %.        Intake/Output Summary (Last 24 hours) at 09/27/2019 0950 Last data filed at 09/27/2019 0830 Gross per 24 hour  Intake 4311.25 ml  Output 2800 ml  Net 1511.25 ml   Filed Weights   09/26/19 0738  Weight: 93.6 kg    Examination:  General - seen in PACU Eyes - pupils reactive ENT - no sinus tenderness, no stridor Cardiac - regular rate/rhythm, no murmur Chest - equal breath sounds b/l, no wheezing or rales Abdomen - soft, non tender, + bowel sounds Extremities - no cyanosis, clubbing, or edema Skin - no rashes Neuro - normal strength, moves extremities, follows commands Psych - normal mood and behavior  Resolved Hospital Problem list     Assessment & Plan:   Acute hypoxic respiratory failure 2nd to acute pulmonary edema. - much improved 5/26 - oxygen prn to keep SpO2 > 92%  Acute on chronic combined  CHF. Hx of CAD with ischemic cardiomyopathy, hypertension, hyperlipidemia. - continue IV lasix for now - cardiology consulted - prn labelalol IV for SBP > 160, DBP > 105  CKD 4. Non gap metabolic acidosis. - f/u BMET - continue HCO3  Nephrolithiasis s/p nephrolithotomy with nephrostomy placement. - post op care per urology  Anemia of chronic disease. - f/u CBC intermittently  Best practice:  Diet: clear liquids and advance to heart healthy diet as tolerated DVT prophylaxis: SCDs GI prophylaxis: not indicated  Mobility: as tolerated Code Status: full code Disposition: transfer to telemetry  Labs:   CMP Latest Ref Rng & Units 09/27/2019 09/26/2019 09/20/2019  Glucose 70 - 99 mg/dL 130(H) 102(H) 113(H)  BUN 8 - 23 mg/dL 42(H) 35(H) 30(H)  Creatinine 0.61 - 1.24 mg/dL 3.48(H) 3.50(H) 3.22(H)  Sodium 135 - 145 mmol/L 138 143 141  Potassium 3.5 - 5.1 mmol/L 4.8 4.0 4.2  Chloride 98 - 111 mmol/L 110 113(H) 108  CO2 22 - 32 mmol/L 18(L) 18(L) 25  Calcium 8.9 - 10.3 mg/dL 9.0 10.2 10.4(H)  Total Protein 6.5 - 8.1 g/dL - - -  Total Bilirubin 0.3 - 1.2 mg/dL - - -  Alkaline Phos 38 - 126 U/L - - -  AST 15 - 41 U/L - - -  ALT 0 - 44 U/L - - -    CBC Latest Ref Rng & Units 09/27/2019 09/26/2019 09/26/2019  WBC 4.0 - 10.5 K/uL - - 5.8  Hemoglobin 13.0 - 17.0 g/dL 10.1(L) 10.7(L) 10.5(L)  Hematocrit 39.0 - 52.0 % 31.7(L) 33.7(L) 32.1(L)  Platelets 150 - 400 K/uL - - 163    ABG    Component Value Date/Time   PHART 7.282 (L) 09/26/2019 1612   PCO2ART 42.9 09/26/2019 1612   PO2ART 65.0 (L) 09/26/2019 1612   HCO3 19.6 (L) 09/26/2019 1612   TCO2 20 (L) 03/12/2019 1015   ACIDBASEDEF 6.2 (H) 09/26/2019 1612   O2SAT 87.8 09/26/2019 1612    Signature:  Chesley Mires, MD [place] Pager - [phone] 09/27/2019, 9:50 AM

## 2019-09-27 NOTE — Progress Notes (Signed)
Call to Dr. Jeffie Pollock, O2 sat decreased, 87% on 4 liters, pt O2 increased to 5 liters sats currently at 91%. Foley to be discontinued, Dr. Jeffie Pollock stated to hold for now and will check in am. SRP, RN

## 2019-09-27 NOTE — Progress Notes (Signed)
Progress Note  Patient Name: [name] Date of Encounter: 09/27/2019  Primary Cardiologist: Sinclair Grooms, MD   Subjective   Patient put out 2.7L urine overnight. Breathing has improved now on 4L O2. No chest pain. Patient seen in PACU but plan to admit to hospitalist service.  Inpatient Medications    Scheduled Meds: . allopurinol  100 mg Oral Daily  . atorvastatin  80 mg Oral q1800  . carvedilol  12.5 mg Oral BID WC  . furosemide      . furosemide  10 mg Intravenous Once  . furosemide  40 mg Intravenous BID  . furosemide  40 mg Oral Daily  . isosorbide-hydrALAZINE  1 tablet Oral BID  . ivabradine  5 mg Oral BID WC  . sodium bicarbonate  650 mg Oral TID   Continuous Infusions: . 0.45 % NaCl with KCl 20 mEq / L 100 mL/hr at 09/27/19 0820  . acetaminophen Stopped (09/27/19 0640)  .  ceFAZolin (ANCEF) IV Stopped (09/27/19 0355)   PRN Meds: acetaminophen, bisacodyl, fentaNYL (SUBLIMAZE) injection, HYDROmorphone (DILAUDID) injection, iohexol, labetalol, nitroGLYCERIN, ondansetron (ZOFRAN) IV, ondansetron, oxyCODONE, senna-docusate, sodium phosphate, zolpidem   Vital Signs    Vitals:   09/27/19 0700 09/27/19 0715 09/27/19 0736 09/27/19 0800  BP: (!) 130/91   (!) 130/96  Pulse: 78 82 80 78  Resp: 16 18 18 15   Temp:    98 F (36.7 C)  TempSrc:      SpO2: 98% 96% 96% 96%  Weight:        Intake/Output Summary (Last 24 hours) at 09/27/2019 0901 Last data filed at 09/27/2019 0830 Gross per 24 hour  Intake 4311.25 ml  Output 2800 ml  Net 1511.25 ml   Last 3 Weights 09/26/2019 09/20/2019 09/20/2019  Weight (lbs) 206 lb 4 oz 206 lb 4 oz 198 lb  Weight (kg) 93.554 kg 93.554 kg 89.812 kg      ECG    No new - Personally Reviewed  Physical Exam   GEN: No acute distress.   Neck: No JVD Cardiac: crackels at bases Respiratory: Clear to auscultation bilaterally. GI: Soft, nontender, non-distended  MS: Trace edema; No deformity. Neuro:  Nonfocal  Psych:  Normal affect   Labs    High Sensitivity Troponin:  No results for input(s): TROPONINIHS in the last 720 hours.    Chemistry Recent Labs  Lab 09/20/19 0913 09/26/19 0721 09/27/19 0514  NA 141 143 138  K 4.2 4.0 4.8  CL 108 113* 110  CO2 25 18* 18*  GLUCOSE 113* 102* 130*  BUN 30* 35* 42*  CREATININE 3.22* 3.50* 3.48*  CALCIUM 10.4* 10.2 9.0  GFRNONAA 20* 18* 18*  GFRAA 23* 20* 21*  ANIONGAP 8 12 10      Hematology Recent Labs  Lab 09/20/19 0913 09/20/19 0913 09/26/19 0721 09/26/19 1456 09/27/19 0514  WBC 6.0  --  5.8  --   --   RBC 3.41*  --  3.28*  --   --   HGB 11.0*   < > 10.5* 10.7* 10.1*  HCT 33.8*   < > 32.1* 33.7* 31.7*  MCV 99.1  --  97.9  --   --   MCH 32.3  --  32.0  --   --   MCHC 32.5  --  32.7  --   --   RDW 13.6  --  13.5  --   --   PLT 170  --  163  --   --    < > =  values in this interval not displayed.    BNP Recent Labs  Lab 09/26/19 1456  BNP 665.6*     DDimer No results for input(s): DDIMER in the last 168 hours.   Radiology    Abdomen 1 View (KUB)  Result Date: 09/26/2019 CLINICAL DATA:  Nephrolithiasis. Status post left percutaneous nephrolithotomy. EXAM: ABDOMEN - 1 VIEW COMPARISON:  CT on 07/11/2019 FINDINGS: The bowel gas pattern is normal. Left percutaneous nephrostomy tube is seen with left ureteral stent extending into the urinary bladder. Significant decrease in left renal stone burden is seen compared to previous CT with only a single 8 mm calculus visualized in mid to lower pole the left kidney. Two right renal calculi are again seen measuring up to 10 mm. IMPRESSION: 1. Significant decrease in left renal calculi compared to previous CT. 2. Left percutaneous nephrostomy tube and ureteral stent seen in place. 3. Two right renal calculi again noted. Electronically Signed   By: Marlaine Hind M.D.   On: 09/26/2019 19:16   DG Chest Port 1 View  Result Date: 09/27/2019 CLINICAL DATA:  Congestive heart failure. EXAM: PORTABLE CHEST 1  VIEW COMPARISON:  09/26/2019 FINDINGS: Unchanged position of left chest wall single lead AICD. Moderate cardiomegaly with unchanged bilateral alveolar pulmonary edema. No sizable pleural effusion. IMPRESSION: Unchanged cardiomegaly and bilateral pulmonary edema. Electronically Signed   By: Ulyses Jarred M.D.   On: 09/27/2019 00:29   DG CHEST PORT 1 VIEW  Result Date: 09/26/2019 CLINICAL DATA:  Hypoxia EXAM: PORTABLE CHEST 1 VIEW COMPARISON:  08/24/2019 FINDINGS: Left-sided single lead pacing device as before. Cardiomegaly with vascular congestion. Extensive interstitial and ground-glass opacity bilaterally suspicious for pulmonary edema. Probable pleural effusions. Basilar consolidations. No pneumothorax. Curvilinear opacity over the right upper quadrant suspect external artifact. IMPRESSION: 1. Cardiomegaly with vascular congestion and extensive bilateral interstitial and ground-glass opacity suspicious for pulmonary edema. Probable pleural effusions. 2. Basilar consolidations may reflect atelectasis or pneumonia. Electronically Signed   By: Donavan Foil M.D.   On: 09/26/2019 17:22   DG C-Arm 1-60 Min-No Report  Result Date: 09/26/2019 Fluoroscopy was utilized by the requesting physician.  No radiographic interpretation.   IR URETERAL STENT LEFT NEW ACCESS W/O SEP NEPHROSTOMY CATH  Result Date: 09/26/2019 INDICATION: Renal stones, access for left percutaneous nephrolithotomy. EXAM: 1. Percutaneous puncture of the renal collecting system under fluoroscopic guidance 2. Placement of a percutaneous nephrostomy tube Operating Physician:  Criselda Peaches, MD COMPARISON:  CT of the abdomen and pelvis-07/11/2019 MEDICATIONS: 2 g Ancef; The antibiotic was administered in an appropriate time frame prior to skin puncture. ANESTHESIA/SEDATION: Fentanyl 200 mcg IV; Versed 12.5 mg IV Moderate Sedation Time:  32 minutes The patient was continuously monitored during the procedure by the interventional radiology  nurse under my direct supervision. CONTRAST:  10mL OMNIPAQUE IOHEXOL 300 MG/ML SOLN - administered into the collecting system(s) FLUOROSCOPY TIME:  Fluoroscopy Time: 7 minutes 42 seconds (182 mGy). COMPLICATIONS: None immediate. PROCEDURE: Informed written consent was obtained from the patient after a thorough discussion of the procedural risks, benefits and alternatives. All questions were addressed. Maximal Sterile Barrier Technique was utilized including caps, mask, sterile gowns, sterile gloves, sterile drape, hand hygiene and skin antiseptic. A timeout was performed prior to the initiation of the procedure. A pre procedural spot fluoroscopic image was obtained of the upper abdomen. The stone within the upper pole infundibulum was targeted fluoroscopically with a [place] needle. Access to the collecting system was confirmed with advancement of a Nitrex  wire into the collecting system. The needle was exchanged for the inner 3 French catheter from an [place] and contrast injection confirmed access. Percutaneous nephrostogram demonstrates marked hydronephrosis of the upper pole collecting system secondary to an obstructing stone mass in the upper pole infundibulum. There are additional stones within calices in the interpolar region of the kidney. The lower pole infundibulum and calices also contain numerous stones, however there is no hydronephrosis. The renal pelvis is very small. A small amount of air was injected into the collecting system to help delineate a posterior calyx. A stone containing interpolar calyx was targeted with a [place] needle. Access to the calyx was confirmed with advancement of a Nitrex wire into the collecting system. An Accustick set was utilized to dilate the tract and was subsequently exchanged for a Kumpe catheter over a Bentson wire. The Kumpe catheter was advanced down the ureter and into the urinary bladder. Postprocedural spot radiographs were obtained in  various obliquities and the catheter was sutured to the skin. The catheter was capped and a dressing was placed. The patient tolerated the procedure well without immediate postprocedural complication. IMPRESSION: 1. Obstructing staghorn calculus in the upper pole infundibulum with resultant significant hydronephrosis of the upper pole and interpolar collecting system. 2. Successful percutaneous access of a stone containing posterior interpolar calyx with advancement of a 5 French catheter past the upper pole infundibulum, through the renal pelvis, down the ureter and into the bladder. Electronically Signed   By: Jacqulynn Cadet M.D.   On: 09/26/2019 14:37    Cardiac Studies   Echo 07/05/19 1. Left ventricular ejection fraction, by estimation, is 20 to 25%. The  left ventricle has severely decreased function. The left ventricle  demonstrates regional wall motion abnormalities (see scoring  diagram/findings for description). The left  ventricular internal cavity size was mildly dilated. Left ventricular  diastolic parameters are indeterminate.  2. Right ventricular systolic function is normal. The right ventricular  size is normal. There is normal pulmonary artery systolic pressure.  3. Left atrial size was severely dilated.  4. The mitral valve is normal in structure and function. Mild mitral  valve regurgitation. No evidence of mitral stenosis.  5. The aortic valve is tricuspid. Aortic valve regurgitation is not  visualized. No aortic stenosis is present.  6. The inferior vena cava is normal in size with greater than 50%  respiratory variability, suggesting right atrial pressure of 3 mmHg.   Patient Profile     64 y.o. male with a hx of chronic systolic and diastolic heart failure, status post ICD, LVEF 20 to 25%, CKD 4, hypertension, and CAD status post recent anterior STEMI who is being seen today for the evaluation of acute systolic diastolic heart failure.  Assessment & Plan      Acute on chronic systolic and diastolic HF s/p ICD - patient was stable prior to admission. HE takes lasix 10 mg daily at home - He received 2L IVF and post-op he developed hypoxia  - IV lasix 40mg  BID ordered yesterday - He put out 2.7L urine overnight - daily weights - breathing is much better, on 4L O2.  - crackles on exam. Would continue with diuresis - Has CKD 4 with baseline creatinine around 3. He was 3.2 on admission. 3.5>3.48 today - CCM following - continue bidil and coreg  CAD s/p STEMI - stable - plan to hold aspirin and ticagrelor for 72 hours   Nephrolithiasis s/p nephrolithotomy with nephrostomy placement - post  op day 1 per urology  For questions or updates, please contact [facility] Please consult [email or web address] for contact info under        Signed, Noe Goyer Ninfa Meeker, PA-C  09/27/2019, 9:01 AM

## 2019-09-27 NOTE — Progress Notes (Signed)
1 Day Post-Op  Subjective: Mr. Schoffstall is sp left PCNL and  has post op hypoxia from fluid overload but is improving with diuresis but he still desats without supplemental O2.  His labs are ok and he has minimal pain.   KUB shows a single 11mm residual midpole stone on the left that I couldn't access at the time of PCNL but on review of his CT it appears to be in an isolated calyx with a stenotic infundibulum.  ROS:  Review of Systems  Respiratory: Positive for shortness of breath.     Anti-infectives: Anti-infectives (From admission, onward)   Start     Dose/Rate Route Frequency Ordered Stop   09/26/19 1900  ceFAZolin (ANCEF) IVPB 1 g/50 mL premix     1 g 100 mL/hr over 30 Minutes Intravenous Every 8 hours 09/26/19 1821     09/26/19 0830  ceFAZolin (ANCEF) IVPB 2g/100 mL premix     2 g 200 mL/hr over 30 Minutes Intravenous To Radiology 09/26/19 0705 09/26/19 1050   09/26/19 0704  ceFAZolin (ANCEF) IVPB 2g/100 mL premix  Status:  Discontinued     2 g 200 mL/hr over 30 Minutes Intravenous 30 min pre-op 09/26/19 4008 09/26/19 1349      Current Facility-Administered Medications  Medication Dose Route Frequency Provider Last Rate Last Admin  . 0.45 % NaCl with KCl 20 mEq / L infusion   Intravenous Continuous Irine Seal, MD 100 mL/hr at 09/27/19 0820 New Bag at 09/27/19 0820  . acetaminophen (OFIRMEV) IV 1,000 mg  1,000 mg Intravenous Q6H Irine Seal, MD   Stopped at 09/27/19 [phone]  . acetaminophen (TYLENOL) tablet 650 mg  650 mg Oral Q4H PRN Irine Seal, MD      . allopurinol (ZYLOPRIM) tablet 100 mg  100 mg Oral Daily Irine Seal, MD      . atorvastatin (LIPITOR) tablet 80 mg  80 mg Oral q1800 Irine Seal, MD      . bisacodyl (DULCOLAX) suppository 10 mg  10 mg Rectal Daily PRN Irine Seal, MD      . carvedilol (COREG) tablet 12.5 mg  12.5 mg Oral BID WC Irine Seal, MD   12.5 mg at 09/27/19 0826  . ceFAZolin (ANCEF) IVPB 1 g/50 mL premix  1 g Intravenous Q8H Irine Seal, MD   Stopped  at 09/27/19 0355  . fentaNYL (SUBLIMAZE) injection 25-50 mcg  25-50 mcg Intravenous Q5 min PRN Catalina Gravel, MD   50 mcg at 09/26/19 1845  . furosemide (LASIX) 10 MG/ML injection           . furosemide (LASIX) injection 10 mg  10 mg Intravenous Once Duane Boston, MD      . furosemide (LASIX) injection 40 mg  40 mg Intravenous BID Skeet Latch, MD   40 mg at 09/27/19 0731  . furosemide (LASIX) tablet 40 mg  40 mg Oral Daily Irine Seal, MD      . HYDROmorphone (DILAUDID) injection 0.5-1 mg  0.5-1 mg Intravenous Q2H PRN Irine Seal, MD   1 mg at 09/27/19 0435  . iohexol (OMNIPAQUE) 300 MG/ML solution    PRN Irine Seal, MD   30 mL at 09/26/19 1359  . isosorbide-hydrALAZINE (BIDIL) 20-37.5 MG per tablet 1 tablet  1 tablet Oral BID Irine Seal, MD   1 tablet at 09/26/19 2220  . ivabradine (CORLANOR) tablet 5 mg  5 mg Oral BID WC Irine Seal, MD   5 mg at 09/27/19 0826  . labetalol (NORMODYNE)  injection 10 mg  10 mg Intravenous Q4H PRN Chesley Mires, MD      . nitroGLYCERIN (NITROSTAT) SL tablet 0.4 mg  0.4 mg Sublingual Q5 min PRN Irine Seal, MD      . ondansetron [facility]) injection 4 mg  4 mg Intravenous Once PRN Catalina Gravel, MD      . ondansetron [facility]) injection 4 mg  4 mg Intravenous Q4H PRN Irine Seal, MD      . oxyCODONE (Oxy IR/ROXICODONE) immediate release tablet 5 mg  5 mg Oral Q4H PRN Irine Seal, MD      . senna-docusate (Senokot-S) tablet 1 tablet  1 tablet Oral QHS PRN Irine Seal, MD      . sodium bicarbonate tablet 650 mg  650 mg Oral TID Irine Seal, MD   650 mg at 09/26/19 2220  . sodium phosphate (FLEET) 7-19 GM/118ML enema 1 enema  1 enema Rectal Once PRN Irine Seal, MD      . zolpidem (AMBIEN) tablet 5 mg  5 mg Oral QHS PRN,MR X 1 Irine Seal, MD         Objective: Vital signs in last 24 hours: Temp:  [96.7 F (35.9 C)-98 F (36.7 C)] 98 F (36.7 C) (05/26 0800) Pulse Rate:  [60-84] 78 (05/26 0800) Resp:  [8-32] 15 (05/26 0800) BP:  (115-167)/(83-120) 130/96 (05/26 0800) SpO2:  [78 %-100 %] 96 % (05/26 0800) Arterial Line BP: (91-176)/(64-109) 107/78 (05/26 0800)  Intake/Output from previous day: 05/25 0701 - 05/26 0700 In: 2773.3 [P.O.:358; I.V.:2115.3; IV Piggyback:300] Out: 2800 [Urine:2775; Blood:25] Intake/Output this shift: No intake/output data recorded.   Physical Exam Vitals reviewed.  Constitutional:      Appearance: Normal appearance.  Cardiovascular:     Rate and Rhythm: Normal rate and regular rhythm.     Heart sounds: Normal heart sounds.  Pulmonary:     Effort: Pulmonary effort is normal.     Breath sounds: Rales present.  Abdominal:     General: Abdomen is flat.     Palpations: Abdomen is soft.     Tenderness: There is no abdominal tenderness.     Comments: NT draining bloody urine.   Genitourinary:    Comments: Foley draining light pink urine.  Neurological:     Mental Status: He is alert.     Lab Results:  Recent Labs    09/26/19 0721 09/26/19 0721 09/26/19 1456 09/27/19 0514  WBC 5.8  --   --   --   HGB 10.5*   < > 10.7* 10.1*  HCT 32.1*   < > 33.7* 31.7*  PLT 163  --   --   --    < > = values in this interval not displayed.   BMET Recent Labs    09/26/19 0721 09/27/19 0514  NA 143 138  K 4.0 4.8  CL 113* 110  CO2 18* 18*  GLUCOSE 102* 130*  BUN 35* 42*  CREATININE 3.50* 3.48*  CALCIUM 10.2 9.0   PT/INR Recent Labs    09/26/19 0721  LABPROT 12.8  INR 1.0   ABG Recent Labs    09/26/19 1612  PHART 7.282*  HCO3 19.6*    Studies/Results: Abdomen 1 View (KUB)  Result Date: 09/26/2019 CLINICAL DATA:  Nephrolithiasis. Status post left percutaneous nephrolithotomy. EXAM: ABDOMEN - 1 VIEW COMPARISON:  CT on 07/11/2019 FINDINGS: The bowel gas pattern is normal. Left percutaneous nephrostomy tube is seen with left ureteral stent extending into the urinary bladder. Significant decrease in  left renal stone burden is seen compared to previous CT with only a  single 8 mm calculus visualized in mid to lower pole the left kidney. Two right renal calculi are again seen measuring up to 10 mm. IMPRESSION: 1. Significant decrease in left renal calculi compared to previous CT. 2. Left percutaneous nephrostomy tube and ureteral stent seen in place. 3. Two right renal calculi again noted. Electronically Signed   By: Marlaine Hind M.D.   On: 09/26/2019 19:16   DG Chest Port 1 View  Result Date: 09/27/2019 CLINICAL DATA:  Congestive heart failure. EXAM: PORTABLE CHEST 1 VIEW COMPARISON:  09/26/2019 FINDINGS: Unchanged position of left chest wall single lead AICD. Moderate cardiomegaly with unchanged bilateral alveolar pulmonary edema. No sizable pleural effusion. IMPRESSION: Unchanged cardiomegaly and bilateral pulmonary edema. Electronically Signed   By: Ulyses Jarred M.D.   On: 09/27/2019 00:29   DG CHEST PORT 1 VIEW  Result Date: 09/26/2019 CLINICAL DATA:  Hypoxia EXAM: PORTABLE CHEST 1 VIEW COMPARISON:  08/24/2019 FINDINGS: Left-sided single lead pacing device as before. Cardiomegaly with vascular congestion. Extensive interstitial and ground-glass opacity bilaterally suspicious for pulmonary edema. Probable pleural effusions. Basilar consolidations. No pneumothorax. Curvilinear opacity over the right upper quadrant suspect external artifact. IMPRESSION: 1. Cardiomegaly with vascular congestion and extensive bilateral interstitial and ground-glass opacity suspicious for pulmonary edema. Probable pleural effusions. 2. Basilar consolidations may reflect atelectasis or pneumonia. Electronically Signed   By: Donavan Foil M.D.   On: 09/26/2019 17:22   DG C-Arm 1-60 Min-No Report  Result Date: 09/26/2019 Fluoroscopy was utilized by the requesting physician.  No radiographic interpretation.   IR URETERAL STENT LEFT NEW ACCESS W/O SEP NEPHROSTOMY CATH  Result Date: 09/26/2019 INDICATION: Renal stones, access for left percutaneous nephrolithotomy. EXAM: 1. Percutaneous  puncture of the renal collecting system under fluoroscopic guidance 2. Placement of a percutaneous nephrostomy tube Operating Physician:  Criselda Peaches, MD COMPARISON:  CT of the abdomen and pelvis-07/11/2019 MEDICATIONS: 2 g Ancef; The antibiotic was administered in an appropriate time frame prior to skin puncture. ANESTHESIA/SEDATION: Fentanyl 200 mcg IV; Versed 12.5 mg IV Moderate Sedation Time:  32 minutes The patient was continuously monitored during the procedure by the interventional radiology nurse under my direct supervision. CONTRAST:  68mL OMNIPAQUE IOHEXOL 300 MG/ML SOLN - administered into the collecting system(s) FLUOROSCOPY TIME:  Fluoroscopy Time: 7 minutes 42 seconds (182 mGy). COMPLICATIONS: None immediate. PROCEDURE: Informed written consent was obtained from the patient after a thorough discussion of the procedural risks, benefits and alternatives. All questions were addressed. Maximal Sterile Barrier Technique was utilized including caps, mask, sterile gowns, sterile gloves, sterile drape, hand hygiene and skin antiseptic. A timeout was performed prior to the initiation of the procedure. A pre procedural spot fluoroscopic image was obtained of the upper abdomen. The stone within the upper pole infundibulum was targeted fluoroscopically with a [place] needle. Access to the collecting system was confirmed with advancement of a Nitrex wire into the collecting system. The needle was exchanged for the inner 3 French catheter from an [place] and contrast injection confirmed access. Percutaneous nephrostogram demonstrates marked hydronephrosis of the upper pole collecting system secondary to an obstructing stone mass in the upper pole infundibulum. There are additional stones within calices in the interpolar region of the kidney. The lower pole infundibulum and calices also contain numerous stones, however there is no hydronephrosis. The renal pelvis is very small. A small amount of  air was injected into the collecting system  to help delineate a posterior calyx. A stone containing interpolar calyx was targeted with a [place] needle. Access to the calyx was confirmed with advancement of a Nitrex wire into the collecting system. An [place] set was utilized to dilate the tract and was subsequently exchanged for a Kum[place] catheter over a [place] wire. The Kumpe catheter was advanced down the ureter and into the urinary bladder. Postprocedural spot radiographs were obtained in various obliquities and the catheter was sutured to the skin. The catheter was capped and a dressing was placed. The patient tolerated the procedure well without immediate postprocedural complication. IMPRESSION: 1. Obstructing staghorn calculus in the upper pole infundibulum with resultant significant hydronephrosis of the upper pole and interpolar collecting system. 2. Successful percutaneous access of a stone containing posterior interpolar calyx with advancement of a 5 French catheter past the upper pole infundibulum, through the renal pelvis, down the ureter and into the bladder. Electronically Signed   By: Jacqulynn Cadet M.D.   On: 09/26/2019 14:37     Assessment and Plan: Left staghorn calculus s/p left PCNL with a single residual  72mm midpole stone that to be in an isolated calyx with a narrow infundibulum.   I don't believe he will need a second look procedure, and I will just follow the residual stone.    Postop fluid overload with hypoxia.  He is improving but continues to require oxygen.        LOS: 0 days    Irine Seal 09/27/2019 [phone] ID: [name], male   DOB: 10-26-1955, 64 y.o.   MRN: [id number]

## 2019-09-28 ENCOUNTER — Encounter (HOSPITAL_COMMUNITY): Admission: RE | Payer: Self-pay | Source: Ambulatory Visit

## 2019-09-28 ENCOUNTER — Ambulatory Visit (HOSPITAL_COMMUNITY): Admission: RE | Admit: 2019-09-28 | Payer: Medicare HMO | Source: Ambulatory Visit | Admitting: Urology

## 2019-09-28 LAB — BASIC METABOLIC PANEL
Anion gap: 11 (ref 5–15)
BUN: 46 mg/dL — ABNORMAL HIGH (ref 8–23)
CO2: 22 mmol/L (ref 22–32)
Calcium: 9.7 mg/dL (ref 8.9–10.3)
Chloride: 107 mmol/L (ref 98–111)
Creatinine, Ser: 3.34 mg/dL — ABNORMAL HIGH (ref 0.61–1.24)
GFR calc Af Amer: 22 mL/min — ABNORMAL LOW (ref >60)
GFR calc non Af Amer: 19 mL/min — ABNORMAL LOW (ref >60)
Glucose, Bld: 110 mg/dL — ABNORMAL HIGH (ref 70–99)
Potassium: 5 mmol/L (ref 3.5–5.1)
Sodium: 140 mmol/L (ref 135–145)

## 2019-09-28 LAB — MAGNESIUM: Magnesium: 2.1 mg/dL (ref 1.7–2.4)

## 2019-09-28 SURGERY — NEPHROLITHOTOMY PERCUTANEOUS SECOND LOOK
Anesthesia: General | Laterality: Left

## 2019-09-28 MED ORDER — HYDROCODONE-ACETAMINOPHEN 5-325 MG PO TABS: 1.0000 | ORAL_TABLET | ORAL | 0 refills | Status: DC | PRN

## 2019-09-28 MED ORDER — OXYCODONE HCL 5 MG PO TABS
10.0000 mg | ORAL_TABLET | Freq: Four times a day (QID) | ORAL | Status: DC | PRN
  Administered 2019-09-28 – 2019-09-29 (×3): 10 mg via ORAL
  Filled 2019-09-28 (×4): qty 2

## 2019-09-28 NOTE — Progress Notes (Signed)
NAME:  [name], MRN:  [id number], DOB:  12-23-1955, LOS: 1 ADMISSION DATE:  09/26/2019, CONSULTATION DATE:  09/26/2019 REFERRING MD:  Dr. Jeffie Pollock, Urology, CHIEF COMPLAINT:  Hypoxia   Brief History   64 yo male with CKD 4 and Lt renal staghorn calculus had Lt percutaneous nephrolithotomy on 09/26/19.  Post op he developed hypoxia from acute CHF exacerbation and PCCM asked to assess.  Past Medical History  Vit D deficiency, CAD s/p CABG, Ischemic CM, s/p AICD, HTN, Secondary hyperparathyroidism, Nephrolithiasis, CKD 4  Significant Hospital Events   5/25 Lt percutaneous nephrolithotomy, hypoxia after procedure 5/26 transfer to telemetry  Consults:  Cardiology  Procedures:    Significant Diagnostic Tests:  Echo 07/05/19 >> EF 20 to 25%, severe LA dilation, mild MR  Micro Data:    Antimicrobials:    Interim history/subjective:  Breathing better.  No cough.  Walked in hall w/o difficulty.  Objective   Blood pressure (!) 131/99, pulse 73, temperature 98.5 F (36.9 C), temperature source Oral, resp. rate 20, height 6' (1.829 m), weight 93 kg, SpO2 94 %.        Intake/Output Summary (Last 24 hours) at 09/28/2019 1045 Last data filed at 09/28/2019 1002 Gross per 24 hour  Intake 200 ml  Output 2825 ml  Net -2625 ml   Filed Weights   09/26/19 0738 09/27/19 2007  Weight: 93.6 kg 93 kg    Examination:  General - alert Eyes - pupils reactive ENT - no sinus tenderness, no stridor Cardiac - regular rate/rhythm, no murmur Chest - equal breath sounds b/l, no wheezing or rales Abdomen - soft, non tender, + bowel sounds Extremities - no cyanosis, clubbing, or edema Skin - no rashes Neuro - normal strength, moves extremities, follows commands Psych - normal mood and behavior  Resolved Hospital Problem list     Assessment & Plan:   Acute on chronic combined CHF with acute pulmonary edema. Hx of CAD with ischemic cardiomyopathy, hypertension, hyperlipidemia. -  negative fluid balance - cardiology following  CKD 4. Non gap metabolic acidosis. - f/u BMET  Nephrolithiasis s/p nephrolithotomy with nephrostomy placement. - post op care per urology  Anemia of chronic disease. - f/u CBC intermittently  Best practice:  Diet: clear liquids and advance to heart healthy diet as tolerated DVT prophylaxis: SCDs GI prophylaxis: not indicated  Mobility: as tolerated Code Status: full code Disposition: telemetry  PCCM will sign off.  Please call if additional help needed while he is in hospital.  Labs:   CMP Latest Ref Rng & Units 09/28/2019 09/27/2019 09/26/2019  Glucose 70 - 99 mg/dL 110(H) 130(H) 102(H)  BUN 8 - 23 mg/dL 46(H) 42(H) 35(H)  Creatinine 0.61 - 1.24 mg/dL 3.34(H) 3.48(H) 3.50(H)  Sodium 135 - 145 mmol/L 140 138 143  Potassium 3.5 - 5.1 mmol/L 5.0 4.8 4.0  Chloride 98 - 111 mmol/L 107 110 113(H)  CO2 22 - 32 mmol/L 22 18(L) 18(L)  Calcium 8.9 - 10.3 mg/dL 9.7 9.0 10.2  Total Protein 6.5 - 8.1 g/dL - - -  Total Bilirubin 0.3 - 1.2 mg/dL - - -  Alkaline Phos 38 - 126 U/L - - -  AST 15 - 41 U/L - - -  ALT 0 - 44 U/L - - -    CBC Latest Ref Rng & Units 09/27/2019 09/26/2019 09/26/2019  WBC 4.0 - 10.5 K/uL - - 5.8  Hemoglobin 13.0 - 17.0 g/dL 10.1(L) 10.7(L) 10.5(L)  Hematocrit 39.0 - 52.0 % 31.7(L) 33.7(L) 32.1(L)  Platelets 150 - 400 K/uL - - 163    ABG    Component Value Date/Time   PHART 7.282 (L) 09/26/2019 1612   PCO2ART 42.9 09/26/2019 1612   PO2ART 65.0 (L) 09/26/2019 1612   HCO3 19.6 (L) 09/26/2019 1612   TCO2 20 (L) 03/12/2019 1015   ACIDBASEDEF 6.2 (H) 09/26/2019 1612   O2SAT 87.8 09/26/2019 1612    Signature:  Chesley Mires, MD [place] Pager - [phone] 09/28/2019, 10:45 AM

## 2019-09-28 NOTE — Progress Notes (Signed)
2 Days Post-Op  Subjective: [name] has had continued need for O2 and has stable pulmonary edema on CXR but he feels the SOB has improved.  He has reduced post op pain and is tolerating a diet.  The urine is light pink in the foley tube and moderately dark without clots in the nephrostomy. His Hgb is minimally changed at 10.1 and his Cr is down some at 3.34.  ROS:  Review of Systems  Respiratory: Positive for shortness of breath.   All other systems reviewed and are negative.   Anti-infectives: Anti-infectives (From admission, onward)    Start     Dose/Rate Route Frequency Ordered Stop   09/27/19 1034  ceFAZolin (ANCEF) 1-4 GM/50ML-% IVPB    Note to Pharmacy: Orpah Greek   : cabinet override      09/27/19 1034 09/27/19 2244   09/26/19 1900  ceFAZolin (ANCEF) IVPB 1 g/50 mL premix     1 g 100 mL/hr over 30 Minutes Intravenous Every 8 hours 09/26/19 1821     09/26/19 0830  ceFAZolin (ANCEF) IVPB 2g/100 mL premix     2 g 200 mL/hr over 30 Minutes Intravenous To Radiology 09/26/19 0705 09/26/19 1050   09/26/19 0704  ceFAZolin (ANCEF) IVPB 2g/100 mL premix  Status:  Discontinued     2 g 200 mL/hr over 30 Minutes Intravenous 30 min pre-op 09/26/19 0704 09/26/19 1349       Current Facility-Administered Medications  Medication Dose Route Frequency Provider Last Rate Last Admin   0.45 % NaCl with KCl 20 mEq / L infusion   Intravenous Continuous Irine Seal, MD 100 mL/hr at 09/28/19 0648 New Bag at 09/28/19 5638   acetaminophen (TYLENOL) tablet 650 mg  650 mg Oral Q4H PRN Irine Seal, MD       allopurinol (ZYLOPRIM) tablet 100 mg  100 mg Oral Daily Irine Seal, MD   100 mg at 09/27/19 0924   atorvastatin (LIPITOR) tablet 80 mg  80 mg Oral q1800 Irine Seal, MD   80 mg at 09/27/19 1650   bisacodyl (DULCOLAX) suppository 10 mg  10 mg Rectal Daily PRN Irine Seal, MD       carvedilol (COREG) tablet 12.5 mg  12.5 mg Oral BID WC Irine Seal, MD   12.5 mg at 09/27/19 1650   ceFAZolin  (ANCEF) IVPB 1 g/50 mL premix  1 g Intravenous Q8H Irine Seal, MD 100 mL/hr at 09/28/19 0226 1 g at 09/28/19 0226   Chlorhexidine Gluconate Cloth 2 % PADS 6 each  6 each Topical Q0600 Irine Seal, MD       furosemide (LASIX) injection 40 mg  40 mg Intravenous BID Skeet Latch, MD   40 mg at 09/27/19 1651   HYDROmorphone (DILAUDID) injection 0.5-1 mg  0.5-1 mg Intravenous Q2H PRN Irine Seal, MD   1 mg at 09/28/19 0625   isosorbide-hydrALAZINE (BIDIL) 20-37.5 MG per tablet 1 tablet  1 tablet Oral BID Irine Seal, MD   1 tablet at 09/27/19 2245   ivabradine (CORLANOR) tablet 5 mg  5 mg Oral BID WC Irine Seal, MD   5 mg at 09/27/19 1651   labetalol (NORMODYNE) injection 10 mg  10 mg Intravenous Q4H PRN Chesley Mires, MD       nitroGLYCERIN (NITROSTAT) SL tablet 0.4 mg  0.4 mg Sublingual Q5 min PRN Irine Seal, MD       ondansetron [facility]) injection 4 mg  4 mg Intravenous Q4H PRN Irine Seal, MD   4 mg  at 09/28/19 1884   oxyCODONE (Oxy IR/ROXICODONE) immediate release tablet 5 mg  5 mg Oral Q4H PRN Irine Seal, MD       senna-docusate (Senokot-S) tablet 1 tablet  1 tablet Oral QHS PRN Irine Seal, MD       sodium bicarbonate tablet 650 mg  650 mg Oral TID Irine Seal, MD   650 mg at 09/27/19 2245   sodium phosphate (FLEET) 7-19 GM/118ML enema 1 enema  1 enema Rectal Once PRN Irine Seal, MD       zolpidem (AMBIEN) tablet 5 mg  5 mg Oral QHS PRN,MR X 1 Irine Seal, MD         Objective: Vital signs in last 24 hours: Temp:  [97.2 F (36.2 C)-98.5 F (36.9 C)] 98.5 F (36.9 C) (05/27 0547) Pulse Rate:  [72-80] 73 (05/27 0547) Resp:  [14-22] 20 (05/27 0547) BP: (117-138)/(88-100) 131/99 (05/27 0547) SpO2:  [92 %-96 %] 94 % (05/27 0547) Arterial Line BP: (107-123)/(74-80) 123/80 (05/26 0900) Weight:  [93 kg] 93 kg (05/26 2007)  Intake/Output from previous day: 05/26 0701 - 05/27 0700 In: 1660 [P.O.:555; I.V.:1000; IV Piggyback:183] Out: 3600 [Urine:3600] Intake/Output this shift: No  intake/output data recorded.   Physical Exam Vitals reviewed.  Constitutional:      Appearance: Normal appearance.  Cardiovascular:     Rate and Rhythm: Normal rate and regular rhythm.  Pulmonary:     Effort: Pulmonary effort is normal.     Breath sounds: Rales (left > right) present.  Abdominal:     General: Abdomen is flat.     Palpations: Abdomen is soft.     Tenderness: There is no abdominal tenderness.     Comments: NT draining bloody urine without clots.   Musculoskeletal:        General: No swelling or tenderness. Normal range of motion.  Skin:    General: Skin is warm and dry.  Neurological:     General: No focal deficit present.     Mental Status: He is alert and oriented to person, place, and time.  Psychiatric:        Mood and Affect: Mood normal.        Behavior: Behavior normal.     Lab Results:  Recent Labs    09/26/19 0721 09/26/19 0721 09/26/19 1456 09/27/19 0514  WBC 5.8  --   --   --   HGB 10.5*   < > 10.7* 10.1*  HCT 32.1*   < > 33.7* 31.7*  PLT 163  --   --   --    < > = values in this interval not displayed.   BMET Recent Labs    09/27/19 0514 09/28/19 0432  NA 138 140  K 4.8 5.0  CL 110 107  CO2 18* 22  GLUCOSE 130* 110*  BUN 42* 46*  CREATININE 3.48* 3.34*  CALCIUM 9.0 9.7   PT/INR Recent Labs    09/26/19 0721  LABPROT 12.8  INR 1.0   ABG Recent Labs    09/26/19 1612  PHART 7.282*  HCO3 19.6*    Studies/Results: Abdomen 1 View (KUB)  Result Date: 09/26/2019 CLINICAL DATA:  Nephrolithiasis. Status post left percutaneous nephrolithotomy. EXAM: ABDOMEN - 1 VIEW COMPARISON:  CT on 07/11/2019 FINDINGS: The bowel gas pattern is normal. Left percutaneous nephrostomy tube is seen with left ureteral stent extending into the urinary bladder. Significant decrease in left renal stone burden is seen compared to previous CT with only a single 8  mm calculus visualized in mid to lower pole the left kidney. Two right renal calculi are  again seen measuring up to 10 mm. IMPRESSION: 1. Significant decrease in left renal calculi compared to previous CT. 2. Left percutaneous nephrostomy tube and ureteral stent seen in place. 3. Two right renal calculi again noted. Electronically Signed   By: Marlaine Hind M.D.   On: 09/26/2019 19:16   DG Chest Port 1 View  Result Date: 09/27/2019 CLINICAL DATA:  Congestive heart failure. EXAM: PORTABLE CHEST 1 VIEW COMPARISON:  09/26/2019 FINDINGS: Unchanged position of left chest wall single lead AICD. Moderate cardiomegaly with unchanged bilateral alveolar pulmonary edema. No sizable pleural effusion. IMPRESSION: Unchanged cardiomegaly and bilateral pulmonary edema. Electronically Signed   By: Ulyses Jarred M.D.   On: 09/27/2019 00:29   DG CHEST PORT 1 VIEW  Result Date: 09/26/2019 CLINICAL DATA:  Hypoxia EXAM: PORTABLE CHEST 1 VIEW COMPARISON:  08/24/2019 FINDINGS: Left-sided single lead pacing device as before. Cardiomegaly with vascular congestion. Extensive interstitial and ground-glass opacity bilaterally suspicious for pulmonary edema. Probable pleural effusions. Basilar consolidations. No pneumothorax. Curvilinear opacity over the right upper quadrant suspect external artifact. IMPRESSION: 1. Cardiomegaly with vascular congestion and extensive bilateral interstitial and ground-glass opacity suspicious for pulmonary edema. Probable pleural effusions. 2. Basilar consolidations may reflect atelectasis or pneumonia. Electronically Signed   By: Donavan Foil M.D.   On: 09/26/2019 17:22   DG C-Arm 1-60 Min-No Report  Result Date: 09/26/2019 Fluoroscopy was utilized by the requesting physician.  No radiographic interpretation.   IR URETERAL STENT LEFT NEW ACCESS W/O SEP NEPHROSTOMY CATH  Result Date: 09/26/2019 INDICATION: Renal stones, access for left percutaneous nephrolithotomy. EXAM: 1. Percutaneous puncture of the renal collecting system under fluoroscopic guidance 2. Placement of a percutaneous  nephrostomy tube Operating Physician:  Criselda Peaches, MD COMPARISON:  CT of the abdomen and pelvis-07/11/2019 MEDICATIONS: 2 g Ancef; The antibiotic was administered in an appropriate time frame prior to skin puncture. ANESTHESIA/SEDATION: Fentanyl 200 mcg IV; Versed 12.5 mg IV Moderate Sedation Time:  32 minutes The patient was continuously monitored during the procedure by the interventional radiology nurse under my direct supervision. CONTRAST:  38mL OMNIPAQUE IOHEXOL 300 MG/ML SOLN - administered into the collecting system(s) FLUOROSCOPY TIME:  Fluoroscopy Time: 7 minutes 42 seconds (182 mGy). COMPLICATIONS: None immediate. PROCEDURE: Informed written consent was obtained from the patient after a thorough discussion of the procedural risks, benefits and alternatives. All questions were addressed. Maximal Sterile Barrier Technique was utilized including caps, mask, sterile gowns, sterile gloves, sterile drape, hand hygiene and skin antiseptic. A timeout was performed prior to the initiation of the procedure. A pre procedural spot fluoroscopic image was obtained of the upper abdomen. The stone within the upper pole infundibulum was targeted fluoroscopically with a [place] needle. Access to the collecting system was confirmed with advancement of a Nitrex wire into the collecting system. The needle was exchanged for the inner 3 French catheter from an [place] and contrast injection confirmed access. Percutaneous nephrostogram demonstrates marked hydronephrosis of the upper pole collecting system secondary to an obstructing stone mass in the upper pole infundibulum. There are additional stones within calices in the interpolar region of the kidney. The lower pole infundibulum and calices also contain numerous stones, however there is no hydronephrosis. The renal pelvis is very small. A small amount of air was injected into the collecting system to help delineate a posterior calyx. A stone containing  interpolar calyx was targeted with a  [place] needle. Access to the calyx was confirmed with advancement of a Nitrex wire into the collecting system. An [place] set was utilized to dilate the tract and was subsequently exchanged for a Kumpe catheter over a [place] wire. The Kumpe catheter was advanced down the ureter and into the urinary bladder. Postprocedural spot radiographs were obtained in various obliquities and the catheter was sutured to the skin. The catheter was capped and a dressing was placed. The patient tolerated the procedure well without immediate postprocedural complication. IMPRESSION: 1. Obstructing staghorn calculus in the upper pole infundibulum with resultant significant hydronephrosis of the upper pole and interpolar collecting system. 2. Successful percutaneous access of a stone containing posterior interpolar calyx with advancement of a 5 French catheter past the upper pole infundibulum, through the renal pelvis, down the ureter and into the bladder. Electronically Signed   By: Jacqulynn Cadet M.D.   On: 09/26/2019 14:37     Assessment and Plan: POD#2 from Left PCNL for staghorn stone.  29mm mid pole calculus remains but it is not felt to merit a second procedure particularly in the face of his cardiac issues.     D/C ancef.  Foley out today.  Consider NT removal tomorrow if urine clears.     Appreciate CC and Cards input.  D/C when cleared by Medical services.       LOS: 1 day    Irine Seal 09/28/2019 [phone] ID: [name], male   DOB: 1955/10/17, 63 y.o.   MRN: [id number]

## 2019-09-28 NOTE — Progress Notes (Signed)
Progress Note  Patient Name: [name] Date of Encounter: 09/28/2019  Primary Cardiologist: Sinclair Grooms, MD   Subjective   He put out 3.6L urine overnight. Breathing is improving on 102L O2. No chest pain. Plan to take the catheter out today.   Inpatient Medications    Scheduled Meds: . allopurinol  100 mg Oral Daily  . atorvastatin  80 mg Oral q1800  . carvedilol  12.5 mg Oral BID WC  . Chlorhexidine Gluconate Cloth  6 each Topical Q0600  . furosemide  40 mg Intravenous BID  . isosorbide-hydrALAZINE  1 tablet Oral BID  . ivabradine  5 mg Oral BID WC  . sodium bicarbonate  650 mg Oral TID   Continuous Infusions: . 0.45 % NaCl with KCl 20 mEq / L 100 mL/hr at 09/28/19 0648   PRN Meds: acetaminophen, bisacodyl, HYDROmorphone (DILAUDID) injection, labetalol, nitroGLYCERIN, ondansetron, oxyCODONE, senna-docusate, sodium phosphate, zolpidem   Vital Signs    Vitals:   09/27/19 2007 09/27/19 2240 09/28/19 0500 09/28/19 0547  BP:  (!) 138/94  (!) 131/99  Pulse:  75  73  Resp:  14  20  Temp:  98 F (36.7 C)  98.5 F (36.9 C)  TempSrc:  Oral  Oral  SpO2:  93% 95% 94%  Weight: 93 kg     Height: 6' (1.829 m)       Intake/Output Summary (Last 24 hours) at 09/28/2019 0747 Last data filed at 09/28/2019 0315 Gross per 24 hour  Intake 1738 ml  Output 3600 ml  Net -1862 ml   Last 3 Weights 09/27/2019 09/26/2019 09/20/2019  Weight (lbs) 205 lb 0.4 oz 206 lb 4 oz 206 lb 4 oz  Weight (kg) 93 kg 93.554 kg 93.554 kg      Telemetry    NSR, HR 60-70s - Personally Reviewed  ECG    No new - Personally Reviewed  Physical Exam   GEN: No acute distress.   Neck: No JVD Cardiac: RRR, no murmurs, rubs, or gallops.  Respiratory: mild crackles at bases GI: Soft, nontender, non-distended  MS: Trace edema; No deformity. Neuro:  Nonfocal  Psych: Normal affect   Labs    High Sensitivity Troponin:  No results for input(s): TROPONINIHS in the last 720 hours.     Chemistry Recent Labs  Lab 09/26/19 0721 09/27/19 0514 09/28/19 0432  NA 143 138 140  K 4.0 4.8 5.0  CL 113* 110 107  CO2 18* 18* 22  GLUCOSE 102* 130* 110*  BUN 35* 42* 46*  CREATININE 3.50* 3.48* 3.34*  CALCIUM 10.2 9.0 9.7  GFRNONAA 18* 18* 19*  GFRAA 20* 21* 22*  ANIONGAP 12 10 11      Hematology Recent Labs  Lab 09/26/19 0721 09/26/19 1456 09/27/19 0514  WBC 5.8  --   --   RBC 3.28*  --   --   HGB 10.5* 10.7* 10.1*  HCT 32.1* 33.7* 31.7*  MCV 97.9  --   --   MCH 32.0  --   --   MCHC 32.7  --   --   RDW 13.5  --   --   PLT 163  --   --     BNP Recent Labs  Lab 09/26/19 1456  BNP 665.6*     DDimer No results for input(s): DDIMER in the last 168 hours.   Radiology    Abdomen 1 View (KUB)  Result Date: 09/26/2019 CLINICAL DATA:  Nephrolithiasis. Status post left percutaneous nephrolithotomy.  EXAM: ABDOMEN - 1 VIEW COMPARISON:  CT on 07/11/2019 FINDINGS: The bowel gas pattern is normal. Left percutaneous nephrostomy tube is seen with left ureteral stent extending into the urinary bladder. Significant decrease in left renal stone burden is seen compared to previous CT with only a single 8 mm calculus visualized in mid to lower pole the left kidney. Two right renal calculi are again seen measuring up to 10 mm. IMPRESSION: 1. Significant decrease in left renal calculi compared to previous CT. 2. Left percutaneous nephrostomy tube and ureteral stent seen in place. 3. Two right renal calculi again noted. Electronically Signed   By: Marlaine Hind M.D.   On: 09/26/2019 19:16   DG Chest Port 1 View  Result Date: 09/27/2019 CLINICAL DATA:  Congestive heart failure. EXAM: PORTABLE CHEST 1 VIEW COMPARISON:  09/26/2019 FINDINGS: Unchanged position of left chest wall single lead AICD. Moderate cardiomegaly with unchanged bilateral alveolar pulmonary edema. No sizable pleural effusion. IMPRESSION: Unchanged cardiomegaly and bilateral pulmonary edema. Electronically Signed    By: Ulyses Jarred M.D.   On: 09/27/2019 00:29   DG CHEST PORT 1 VIEW  Result Date: 09/26/2019 CLINICAL DATA:  Hypoxia EXAM: PORTABLE CHEST 1 VIEW COMPARISON:  08/24/2019 FINDINGS: Left-sided single lead pacing device as before. Cardiomegaly with vascular congestion. Extensive interstitial and ground-glass opacity bilaterally suspicious for pulmonary edema. Probable pleural effusions. Basilar consolidations. No pneumothorax. Curvilinear opacity over the right upper quadrant suspect external artifact. IMPRESSION: 1. Cardiomegaly with vascular congestion and extensive bilateral interstitial and ground-glass opacity suspicious for pulmonary edema. Probable pleural effusions. 2. Basilar consolidations may reflect atelectasis or pneumonia. Electronically Signed   By: Donavan Foil M.D.   On: 09/26/2019 17:22   DG C-Arm 1-60 Min-No Report  Result Date: 09/26/2019 Fluoroscopy was utilized by the requesting physician.  No radiographic interpretation.   IR URETERAL STENT LEFT NEW ACCESS W/O SEP NEPHROSTOMY CATH  Result Date: 09/26/2019 INDICATION: Renal stones, access for left percutaneous nephrolithotomy. EXAM: 1. Percutaneous puncture of the renal collecting system under fluoroscopic guidance 2. Placement of a percutaneous nephrostomy tube Operating Physician:  Criselda Peaches, MD COMPARISON:  CT of the abdomen and pelvis-07/11/2019 MEDICATIONS: 2 g Ancef; The antibiotic was administered in an appropriate time frame prior to skin puncture. ANESTHESIA/SEDATION: Fentanyl 200 mcg IV; Versed 12.5 mg IV Moderate Sedation Time:  32 minutes The patient was continuously monitored during the procedure by the interventional radiology nurse under my direct supervision. CONTRAST:  43mL OMNIPAQUE IOHEXOL 300 MG/ML SOLN - administered into the collecting system(s) FLUOROSCOPY TIME:  Fluoroscopy Time: 7 minutes 42 seconds (182 mGy). COMPLICATIONS: None immediate. PROCEDURE: Informed written consent was obtained from the  patient after a thorough discussion of the procedural risks, benefits and alternatives. All questions were addressed. Maximal Sterile Barrier Technique was utilized including caps, mask, sterile gowns, sterile gloves, sterile drape, hand hygiene and skin antiseptic. A timeout was performed prior to the initiation of the procedure. A pre procedural spot fluoroscopic image was obtained of the upper abdomen. The stone within the upper pole infundibulum was targeted fluoroscopically with a [place] needle. Access to the collecting system was confirmed with advancement of a Nitrex wire into the collecting system. The needle was exchanged for the inner 3 French catheter from an [name] and contrast injection confirmed access. Percutaneous nephrostogram demonstrates marked hydronephrosis of the upper pole collecting system secondary to an obstructing stone mass in the upper pole infundibulum. There are additional stones within calices in the interpolar region of  the kidney. The lower pole infundibulum and calices also contain numerous stones, however there is no hydronephrosis. The renal pelvis is very small. A small amount of air was injected into the collecting system to help delineate a posterior calyx. A stone containing interpolar calyx was targeted with a [place] needle. Access to the calyx was confirmed with advancement of a Nitrex wire into the collecting system. An Accustick set was utilized to dilate the tract and was subsequently exchanged for a Kumpe catheter over a Bentson wire. The Kumpe catheter was advanced down the ureter and into the urinary bladder. Postprocedural spot radiographs were obtained in various obliquities and the catheter was sutured to the skin. The catheter was capped and a dressing was placed. The patient tolerated the procedure well without immediate postprocedural complication. IMPRESSION: 1. Obstructing staghorn calculus in the upper pole infundibulum with resultant  significant hydronephrosis of the upper pole and interpolar collecting system. 2. Successful percutaneous access of a stone containing posterior interpolar calyx with advancement of a 5 French catheter past the upper pole infundibulum, through the renal pelvis, down the ureter and into the bladder. Electronically Signed   By: Jacqulynn Cadet M.D.   On: 09/26/2019 14:37    Cardiac Studies   Echo 07/05/19 1. Left ventricular ejection fraction, by estimation, is 20 to 25%. The  left ventricle has severely decreased function. The left ventricle  demonstrates regional wall motion abnormalities (see scoring  diagram/findings for description). The left  ventricular internal cavity size was mildly dilated. Left ventricular  diastolic parameters are indeterminate.  2. Right ventricular systolic function is normal. The right ventricular  size is normal. There is normal pulmonary artery systolic pressure.  3. Left atrial size was severely dilated.  4. The mitral valve is normal in structure and function. Mild mitral  valve regurgitation. No evidence of mitral stenosis.  5. The aortic valve is tricuspid. Aortic valve regurgitation is not  visualized. No aortic stenosis is present.  6. The inferior vena cava is normal in size with greater than 50%  respiratory variability, suggesting right atrial pressure of 3 mmHg.   Patient Profile     64 y.o. male with a hx of chronic systolic and diastolic heart failure, status post ICD, LVEF 20 to 25%, CKD 4, hypertension, and CAD status post recent anterior STEMIwho is being seen for the evaluation of acute systolic diastolic heart failure.  Assessment & Plan    Acute on chronic systolic and diastolic HF s/p ICD - patient was stable prior to admission. He takes lasix 10 mg daily at home - He received 2L IVF and post-op he developed hypoxia  - started on IV lasix 40mg  BID - He put out 3.6L urine overnight - Weight so far down 1lb - breathing is  improving - Has CKD 4 with baseline creatinine around 3. He was 3.2 on admission. This has been improving with diuresis 3.34 today - CCM following - continue bidil and coreg - Fluid status is improving, still needs some diuresis. Can likely switch to oral lasix  CAD s/p STEMI - stable - plan to hold aspirin and ticagrelor for 72 hours   Nephrolithiasis s/p nephrolithotomy with nephrostomy placement - post op day 2 per urology  For questions or updates, please contact [place] Please consult [email or web address] for contact info under        Signed, Raad Clayson Ninfa Meeker, PA-C  09/28/2019, 7:47 AM

## 2019-09-29 ENCOUNTER — Ambulatory Visit: Payer: Medicare HMO | Admitting: Urology

## 2019-09-29 ENCOUNTER — Other Ambulatory Visit: Payer: Self-pay | Admitting: Medical

## 2019-09-29 ENCOUNTER — Other Ambulatory Visit: Payer: Self-pay

## 2019-09-29 DIAGNOSIS — N132 Hydronephrosis with renal and ureteral calculous obstruction: Secondary | ICD-10-CM

## 2019-09-29 DIAGNOSIS — R0602 Shortness of breath: Secondary | ICD-10-CM

## 2019-09-29 DIAGNOSIS — I5041 Acute combined systolic (congestive) and diastolic (congestive) heart failure: Secondary | ICD-10-CM

## 2019-09-29 LAB — BASIC METABOLIC PANEL
Anion gap: 7 (ref 5–15)
BUN: 44 mg/dL — ABNORMAL HIGH (ref 8–23)
CO2: 24 mmol/L (ref 22–32)
Calcium: 9.7 mg/dL (ref 8.9–10.3)
Chloride: 106 mmol/L (ref 98–111)
Creatinine, Ser: 3.21 mg/dL — ABNORMAL HIGH (ref 0.61–1.24)
GFR calc Af Amer: 23 mL/min — ABNORMAL LOW (ref >60)
GFR calc non Af Amer: 20 mL/min — ABNORMAL LOW (ref >60)
Glucose, Bld: 99 mg/dL (ref 70–99)
Potassium: 4.6 mmol/L (ref 3.5–5.1)
Sodium: 137 mmol/L (ref 135–145)

## 2019-09-29 LAB — HEMOGLOBIN AND HEMATOCRIT, BLOOD
HCT: 30.3 % — ABNORMAL LOW (ref 39.0–52.0)
Hemoglobin: 9.8 g/dL — ABNORMAL LOW (ref 13.0–17.0)

## 2019-09-29 MED ORDER — FUROSEMIDE 40 MG PO TABS
40.0000 mg | ORAL_TABLET | Freq: Every day | ORAL | Status: DC
  Administered 2019-09-29: 40 mg via ORAL
  Filled 2019-09-29: qty 1

## 2019-09-29 NOTE — Plan of Care (Signed)
  Problem: Education: Goal: Knowledge of General Education information will improve Description: Including pain rating scale, medication(s)/side effects and non-pharmacologic comfort measures Outcome: Completed/Met   Problem: Clinical Measurements: Goal: Ability to maintain clinical measurements within normal limits will improve Outcome: Completed/Met Goal: Diagnostic test results will improve Outcome: Completed/Met Goal: Respiratory complications will improve Outcome: Completed/Met   Problem: Activity: Goal: Risk for activity intolerance will decrease Outcome: Completed/Met   Problem: Nutrition: Goal: Adequate nutrition will be maintained Outcome: Completed/Met   Problem: Coping: Goal: Level of anxiety will decrease Outcome: Completed/Met

## 2019-09-29 NOTE — Progress Notes (Signed)
3 Days Post-Op  Subjective: [name] is doing well but has required Oxycodone for pain.  He had the foley removed yesterday but has not voided as all of the urine has passed via the NT with the distal tip in the bladder.  His Sats have been 94-95% on O2.   ROS:  Review of Systems  Respiratory: Negative for shortness of breath.   All other systems reviewed and are negative.   Anti-infectives: Anti-infectives (From admission, onward)   Start     Dose/Rate Route Frequency Ordered Stop   09/27/19 1034  ceFAZolin (ANCEF) 1-4 GM/50ML-% IVPB    Note to Pharmacy: Orpah Greek   : cabinet override      09/27/19 1034 09/27/19 2244   09/26/19 1900  ceFAZolin (ANCEF) IVPB 1 g/50 mL premix  Status:  Discontinued     1 g 100 mL/hr over 30 Minutes Intravenous Every 8 hours 09/26/19 1821 09/28/19 0737   09/26/19 0830  ceFAZolin (ANCEF) IVPB 2g/100 mL premix     2 g 200 mL/hr over 30 Minutes Intravenous To Radiology 09/26/19 0705 09/26/19 1050   09/26/19 0704  ceFAZolin (ANCEF) IVPB 2g/100 mL premix  Status:  Discontinued     2 g 200 mL/hr over 30 Minutes Intravenous 30 min pre-op 09/26/19 0704 09/26/19 1349      Current Facility-Administered Medications  Medication Dose Route Frequency Provider Last Rate Last Admin  . acetaminophen (TYLENOL) tablet 650 mg  650 mg Oral Q4H PRN Irine Seal, MD      . allopurinol (ZYLOPRIM) tablet 100 mg  100 mg Oral Daily Irine Seal, MD   100 mg at 09/28/19 2563  . atorvastatin (LIPITOR) tablet 80 mg  80 mg Oral q1800 Irine Seal, MD   80 mg at 09/28/19 1607  . bisacodyl (DULCOLAX) suppository 10 mg  10 mg Rectal Daily PRN Irine Seal, MD      . carvedilol (COREG) tablet 12.5 mg  12.5 mg Oral BID WC Irine Seal, MD   12.5 mg at 09/28/19 1607  . Chlorhexidine Gluconate Cloth 2 % PADS 6 each  6 each Topical Q0600 Irine Seal, MD      . furosemide (LASIX) injection 40 mg  40 mg Intravenous BID Skeet Latch, MD   40 mg at 09/28/19 1607  . HYDROmorphone  (DILAUDID) injection 0.5-1 mg  0.5-1 mg Intravenous Q2H PRN Irine Seal, MD   1 mg at 09/28/19 1411  . isosorbide-hydrALAZINE (BIDIL) 20-37.5 MG per tablet 1 tablet  1 tablet Oral BID Irine Seal, MD   1 tablet at 09/28/19 2300  . ivabradine (CORLANOR) tablet 5 mg  5 mg Oral BID WC Irine Seal, MD   5 mg at 09/28/19 1607  . labetalol (NORMODYNE) injection 10 mg  10 mg Intravenous Q4H PRN Chesley Mires, MD      . nitroGLYCERIN (NITROSTAT) SL tablet 0.4 mg  0.4 mg Sublingual Q5 min PRN Irine Seal, MD      . ondansetron [facility]) injection 4 mg  4 mg Intravenous Q4H PRN Irine Seal, MD   4 mg at 09/28/19 8937  . oxyCODONE (Oxy IR/ROXICODONE) immediate release tablet 10 mg  10 mg Oral Q6H PRN Irine Seal, MD   10 mg at 09/29/19 0541  . senna-docusate (Senokot-S) tablet 1 tablet  1 tablet Oral QHS PRN Irine Seal, MD      . sodium bicarbonate tablet 650 mg  650 mg Oral TID Irine Seal, MD   650 mg at 09/28/19 2300  .  sodium phosphate (FLEET) 7-19 GM/118ML enema 1 enema  1 enema Rectal Once PRN Irine Seal, MD      . zolpidem (AMBIEN) tablet 5 mg  5 mg Oral QHS PRN,MR X 1 Irine Seal, MD         Objective: Vital signs in last 24 hours: Temp:  [97.8 F (36.6 C)-98.4 F (36.9 C)] 98.3 F (36.8 C) (05/28 0644) Pulse Rate:  [69-74] 74 (05/28 0644) Resp:  [18-20] 20 (05/28 0644) BP: (124-132)/(85-88) 132/88 (05/28 0644) SpO2:  [95 %] 95 % (05/28 0644)  Intake/Output from previous day: 05/27 0701 - 05/28 0700 In: -  Out: 3525 [Urine:3525] Intake/Output this shift: Total I/O In: -  Out: 2225 [Urine:2225]   Physical Exam Vitals reviewed.  Constitutional:      Appearance: Normal appearance.  Cardiovascular:     Rate and Rhythm: Normal rate and regular rhythm.  Pulmonary:     Effort: Pulmonary effort is normal. No respiratory distress.     Breath sounds: Rales: left > right.  Abdominal:     General: Abdomen is flat.     Palpations: Abdomen is soft.     Comments: NT draining bloody urine  without clots.   Musculoskeletal:        General: No swelling or tenderness. Normal range of motion.  Skin:    General: Skin is warm and dry.  Neurological:     General: No focal deficit present.     Mental Status: He is alert and oriented to person, place, and time.  Psychiatric:        Mood and Affect: Mood normal.        Behavior: Behavior normal.     Lab Results:  Recent Labs    09/26/19 0721 09/26/19 0721 09/26/19 1456 09/27/19 0514  WBC 5.8  --   --   --   HGB 10.5*   < > 10.7* 10.1*  HCT 32.1*   < > 33.7* 31.7*  PLT 163  --   --   --    < > = values in this interval not displayed.   BMET Recent Labs    09/27/19 0514 09/28/19 0432  NA 138 140  K 4.8 5.0  CL 110 107  CO2 18* 22  GLUCOSE 130* 110*  BUN 42* 46*  CREATININE 3.48* 3.34*  CALCIUM 9.0 9.7   PT/INR Recent Labs    09/26/19 0721  LABPROT 12.8  INR 1.0   ABG Recent Labs    09/26/19 1612  PHART 7.282*  HCO3 19.6*    Studies/Results: No results found.  Left NT removed and dressing placed.   Assessment and Plan: POD#3 from Left PCNL for staghorn stone.  31mm mid pole calculus remains but it is not felt to merit a second procedure particularly in the face of his cardiac issues.     NT removed and dressing replaced.  CKD.  Cr is down some s/p PCNL.  I will repeat labs today.   Appreciate CC and Cards input.  D/C when cleared by Medical services.       LOS: 2 days    Irine Seal 09/29/2019 [phone] ID: [name], male   DOB: 1955-05-29, 64 y.o.   MRN: [id number] Patient ID: [name], male   DOB: 03/11/1956, 64 y.o.   MRN: [id number]

## 2019-09-29 NOTE — Addendum Note (Signed)
Encounter addended by: Verlan Friends on: 09/29/2019 11:38 AM  Actions taken: Flowsheet accepted

## 2019-09-29 NOTE — Progress Notes (Signed)
SATURATION QUALIFICATIONS: (This note is used to comply with regulatory documentation for home oxygen)  Patient Saturations on Room Air at Rest = 95%  Patient Saturations on Room Air while Ambulating = 100%  Please briefly explain why patient needs home oxygen: N/A

## 2019-09-29 NOTE — Care Management Important Message (Signed)
Important Message  Patient Details IM Letter given to Case Manager to present to the Patient Name: [name] MRN: [id number] Date of Birth: 08-13-1955   Medicare Important Message Given:  Yes     Kerin Salen 09/29/2019, 1:59 PM

## 2019-09-29 NOTE — Progress Notes (Signed)
Discharge instructions provided, verbalized understanding, denied any questions. Transported via wheelchair to lobby by Nurse Tech. Neomia Dear, RN

## 2019-09-29 NOTE — Progress Notes (Signed)
Progress Note  Patient Name: [name] Date of Encounter: 09/29/2019  Primary Cardiologist: Sinclair Grooms, MD   Subjective   Patient put out 3.5L urine overnight. Breathing is much better. When I walked in oxygen was out and breathing was okay. No chest pain.   Inpatient Medications    Scheduled Meds: . allopurinol  100 mg Oral Daily  . atorvastatin  80 mg Oral q1800  . carvedilol  12.5 mg Oral BID WC  . Chlorhexidine Gluconate Cloth  6 each Topical Q0600  . furosemide  40 mg Intravenous BID  . isosorbide-hydrALAZINE  1 tablet Oral BID  . ivabradine  5 mg Oral BID WC  . sodium bicarbonate  650 mg Oral TID   Continuous Infusions:  PRN Meds: acetaminophen, bisacodyl, HYDROmorphone (DILAUDID) injection, labetalol, nitroGLYCERIN, ondansetron, oxyCODONE, senna-docusate, sodium phosphate, zolpidem   Vital Signs    Vitals:   09/28/19 0547 09/28/19 1500 09/28/19 2154 09/29/19 0644  BP: (!) 131/99 124/85 126/86 132/88  Pulse: 73 70 69 74  Resp: 20 20 18 20   Temp: 98.5 F (36.9 C) 97.8 F (36.6 C) 98.4 F (36.9 C) 98.3 F (36.8 C)  TempSrc: Oral Oral Oral   SpO2: 94%  95% 95%  Weight:      Height:        Intake/Output Summary (Last 24 hours) at 09/29/2019 0856 Last data filed at 09/29/2019 0701 Gross per 24 hour  Intake --  Output 3825 ml  Net -3825 ml   Last 3 Weights 09/27/2019 09/26/2019 09/20/2019  Weight (lbs) 205 lb 0.4 oz 206 lb 4 oz 206 lb 4 oz  Weight (kg) 93 kg 93.554 kg 93.554 kg      Telemetry    NSR, HR 60-70s, PVCs - Personally Reviewed  ECG    No new - Personally Reviewed  Physical Exam   GEN: No acute distress.   Neck: No JVD Cardiac: RRR, no murmurs, rubs, or gallops.  Respiratory: Clear to auscultation bilaterally. GI: Soft, nontender, non-distended  MS: Trace edema; No deformity. Neuro:  Nonfocal  Psych: Normal affect   Labs    High Sensitivity Troponin:  No results for input(s): TROPONINIHS in the last 720 hours.     Chemistry Recent Labs  Lab 09/27/19 0514 09/28/19 0432 09/29/19 0744  NA 138 140 137  K 4.8 5.0 4.6  CL 110 107 106  CO2 18* 22 24  GLUCOSE 130* 110* 99  BUN 42* 46* 44*  CREATININE 3.48* 3.34* 3.21*  CALCIUM 9.0 9.7 9.7  GFRNONAA 18* 19* 20*  GFRAA 21* 22* 23*  ANIONGAP 10 11 7      Hematology Recent Labs  Lab 09/26/19 0721 09/26/19 0721 09/26/19 1456 09/27/19 0514 09/29/19 0744  WBC 5.8  --   --   --   --   RBC 3.28*  --   --   --   --   HGB 10.5*   < > 10.7* 10.1* 9.8*  HCT 32.1*   < > 33.7* 31.7* 30.3*  MCV 97.9  --   --   --   --   MCH 32.0  --   --   --   --   MCHC 32.7  --   --   --   --   RDW 13.5  --   --   --   --   PLT 163  --   --   --   --    < > = values in  this interval not displayed.    BNP Recent Labs  Lab 09/26/19 1456  BNP 665.6*     DDimer No results for input(s): DDIMER in the last 168 hours.   Radiology    No results found.  Cardiac Studies   Echo 07/05/19 1. Left ventricular ejection fraction, by estimation, is 20 to 25%. The  left ventricle has severely decreased function. The left ventricle  demonstrates regional wall motion abnormalities (see scoring  diagram/findings for description). The left  ventricular internal cavity size was mildly dilated. Left ventricular  diastolic parameters are indeterminate.  2. Right ventricular systolic function is normal. The right ventricular  size is normal. There is normal pulmonary artery systolic pressure.  3. Left atrial size was severely dilated.  4. The mitral valve is normal in structure and function. Mild mitral  valve regurgitation. No evidence of mitral stenosis.  5. The aortic valve is tricuspid. Aortic valve regurgitation is not  visualized. No aortic stenosis is present.  6. The inferior vena cava is normal in size with greater than 50%  respiratory variability, suggesting right atrial pressure of 3 mmHg.   Patient Profile     64 y.o. male  with a hx of chronic  systolic and diastolic heart failure, status post ICD, LVEF 20 to 25%, CKD 4, hypertension, and CAD status post recent anterior STEMIwho is being seen for the evaluation of acute systolic diastolic heart failure.  Assessment & Plan    Acute on chronic systolic and diastolic HF s/p ICD - patient was stable prior to admission.He takes lasix 10 mg daily at home - He received 2L IVF and post-op he developed hypoxia  - started on IV lasix 40mg  BID - He put out 3.5L urineovernight, net -5.7L - Has CKD 4 with baseline creatinine around 3. He was 3.2 on admission. This has been improving with diuresis 3.34 >3.21 - continue bidil and coreg - Appears euvolemic on exam. Will switch to oral lasix today. Asked nurse to walk with the patient and monitor sats  CAD s/p STEMI - stable - plan to hold aspirin and ticagrelor for 72 hours  Nephrolithiasis s/p nephrolithotomy with nephrostomy placement - post op day 3 per urology   For questions or updates, please contact [facility] Please consult [email or web address] for contact info under        Signed, Kissy Cielo Ninfa Meeker, PA-C  09/29/2019, 8:56 AM

## 2019-09-29 NOTE — Progress Notes (Signed)
Attempted to provide patient with discharge instructions. Per patient request, want to wait for family for discharge teaching. Will provide when pt's family arrive. Neomia Dear, RN

## 2019-09-29 NOTE — Plan of Care (Signed)
  Problem: Pain Managment: Goal: General experience of comfort will improve Outcome: Progressing   Problem: Safety: Goal: Ability to remain free from injury will improve Outcome: Progressing   Problem: Skin Integrity: Goal: Risk for impaired skin integrity will decrease Outcome: Progressing   

## 2019-09-30 NOTE — Discharge Summary (Signed)
Physician Discharge Summary  Patient ID: [name] MRN: [id number] DOB/AGE: 1955/05/31 64 y.o.  Admit date: 09/26/2019 Discharge date: 09/30/2019  Admission Diagnoses:  Left renal stone  Discharge Diagnoses:  Principal Problem:   Left renal stone Active Problems:   Acute combined systolic and diastolic heart failure (HCC)   Congestive heart failure (HCC)   CAD in native artery   SOB (shortness of breath)   Past Medical History:  Diagnosis Date  . Acute ST elevation myocardial infarction (STEMI) due to occlusion of mid portion of left anterior descending (LAD) coronary artery ([place]) 03/12/2019  . Acute systolic heart failure ([place])   . Arthritis   . CHF (congestive heart failure) ([place])   . CKD (chronic kidney disease) stage 4, GFR 15-29 ml/min (HCC)   . Dyspnea    with exertionm periodically  . Heart attack ([place]) 03/15/2019   anterior MI   . History of kidney stones   . Hyperparathyroidism ([place])   . Hypertension 2002  . Ischemic cardiomyopathy   . Left knee pain   . Syncope 04/04/2019   after blood draw  . Vitamin D deficiency     Surgeries: Procedure(s): LEFT NEPHROLITHOTOMY PERCUTANEOUS on 09/26/2019   Consultants (if any): Treatment Team:  Lbcardiology, Round[place], MD  Discharged Condition: Improved  Hospital Course: [name] is an 64 y.o. male who was admitted 09/26/2019 with a diagnosis of Left renal stone and went to the operating room on 09/26/2019 and underwent the above named procedures.  He had a stone burden of about 3.4cm and all but a single 80mm stone in the mid pole was removed and it was felt to be in an isolated calyx with infundibular stenosis.  HE was noted to have hypoxia in the PACU which was felt to be secondary to fluid overload.  Critical care and cardiology were consulted and he was placed in the ICU for monitoring.  He was gradually diuresed.   The foley was removed on 5/27.  The nephrostomy tube was removed on 5/28.  He was able to  ambulated without O2 on 5/28 and was discharged home.   He was given perioperative antibiotics:  Anti-infectives (From admission, onward)   Start     Dose/Rate Route Frequency Ordered Stop   09/27/19 1034  ceFAZolin (ANCEF) 1-4 GM/50ML-% IVPB    Note to Pharmacy: Orpah Greek   : cabinet override      09/27/19 1034 09/27/19 2244   09/26/19 1900  ceFAZolin (ANCEF) IVPB 1 g/50 mL premix  Status:  Discontinued     1 g 100 mL/hr over 30 Minutes Intravenous Every 8 hours 09/26/19 1821 09/28/19 0737   09/26/19 0830  ceFAZolin (ANCEF) IVPB 2g/100 mL premix     2 g 200 mL/hr over 30 Minutes Intravenous To Radiology 09/26/19 0705 09/26/19 1050   09/26/19 0704  ceFAZolin (ANCEF) IVPB 2g/100 mL premix  Status:  Discontinued     2 g 200 mL/hr over 30 Minutes Intravenous 30 min pre-op 09/26/19 0704 09/26/19 1349    .  He was given sequential compression devices for DVT prophylaxis.  He benefited maximally from the hospital stay.  His stay was complicated by postop pulmonary edema.    Recent vital signs:  Vitals:   09/29/19 0644 09/29/19 1700  BP: 132/88 135/88  Pulse: 74 76  Resp: 20 20  Temp: 98.3 F (36.8 C) 98.5 F (36.9 C)  SpO2: 95% 91%    Recent laboratory studies:  Lab Results  Component Value Date  HGB 9.8 (L) 09/29/2019   HGB 10.1 (L) 09/27/2019   HGB 10.7 (L) 09/26/2019   Lab Results  Component Value Date   WBC 5.8 09/26/2019   PLT 163 09/26/2019   Lab Results  Component Value Date   INR 1.0 09/26/2019   Lab Results  Component Value Date   NA 137 09/29/2019   K 4.6 09/29/2019   CL 106 09/29/2019   CO2 24 09/29/2019   BUN 44 (H) 09/29/2019   CREATININE 3.21 (H) 09/29/2019   GLUCOSE 99 09/29/2019    Discharge Medications:   Allergies as of 09/29/2019   No Known Allergies     Medication List    TAKE these medications   allopurinol 100 MG tablet Commonly known as: ZYLOPRIM Take 100 mg by mouth daily.   aspirin 81 MG chewable tablet Chew 1 tablet  (81 mg total) by mouth daily.   atorvastatin 80 MG tablet Commonly known as: LIPITOR Take 1 tablet (80 mg total) by mouth daily at 6 PM.   BiDil 20-37.5 MG tablet Generic drug: isosorbide-hydrALAZINE Take 1 tablet by mouth twice daily What changed: when to take this   carvedilol 12.5 MG tablet Commonly known as: COREG TAKE 1 TABLET BY MOUTH TWICE DAILY WITH MEALS   furosemide 40 MG tablet Commonly known as: LASIX TAKE 1 TABLET BY MOUTH ONCE DAILY AS NEEDED FOR FLUID What changed: See the new instructions.   HYDROcodone-acetaminophen 5-325 MG tablet Commonly known as: NORCO/VICODIN Take 1 tablet by mouth every 4 (four) hours as needed for moderate pain.   ivabradine 5 MG Tabs tablet Commonly known as: Corlanor Take 1 tablet (5 mg total) by mouth 2 (two) times daily with a meal.   nitroGLYCERIN 0.4 MG SL tablet Commonly known as: Nitrostat Place 1 tablet (0.4 mg total) under the tongue every 5 (five) minutes as needed.   sodium bicarbonate 650 MG tablet Take 650 mg by mouth 3 (three) times daily.   ticagrelor 90 MG Tabs tablet Commonly known as: BRILINTA Take 1 tablet (90 mg total) by mouth 2 (two) times daily.     ASK your doctor about these medications   ergocalciferol 1.25 MG (50000 UT) capsule Commonly known as: VITAMIN D2 Take 50,000 Units by mouth every Wednesday. Ask about: Should I take this medication?       Diagnostic Studies: Abdomen 1 View (KUB)  Result Date: 09/26/2019 CLINICAL DATA:  Nephrolithiasis. Status post left percutaneous nephrolithotomy. EXAM: ABDOMEN - 1 VIEW COMPARISON:  CT on 07/11/2019 FINDINGS: The bowel gas pattern is normal. Left percutaneous nephrostomy tube is seen with left ureteral stent extending into the urinary bladder. Significant decrease in left renal stone burden is seen compared to previous CT with only a single 8 mm calculus visualized in mid to lower pole the left kidney. Two right renal calculi are again seen measuring up  to 10 mm. IMPRESSION: 1. Significant decrease in left renal calculi compared to previous CT. 2. Left percutaneous nephrostomy tube and ureteral stent seen in place. 3. Two right renal calculi again noted. Electronically Signed   By: Marlaine Hind M.D.   On: 09/26/2019 19:16   DG Chest Port 1 View  Result Date: 09/27/2019 CLINICAL DATA:  Congestive heart failure. EXAM: PORTABLE CHEST 1 VIEW COMPARISON:  09/26/2019 FINDINGS: Unchanged position of left chest wall single lead AICD. Moderate cardiomegaly with unchanged bilateral alveolar pulmonary edema. No sizable pleural effusion. IMPRESSION: Unchanged cardiomegaly and bilateral pulmonary edema. Electronically Signed   By: Ulyses Jarred  M.D.   On: 09/27/2019 00:29   DG CHEST PORT 1 VIEW  Result Date: 09/26/2019 CLINICAL DATA:  Hypoxia EXAM: PORTABLE CHEST 1 VIEW COMPARISON:  08/24/2019 FINDINGS: Left-sided single lead pacing device as before. Cardiomegaly with vascular congestion. Extensive interstitial and ground-glass opacity bilaterally suspicious for pulmonary edema. Probable pleural effusions. Basilar consolidations. No pneumothorax. Curvilinear opacity over the right upper quadrant suspect external artifact. IMPRESSION: 1. Cardiomegaly with vascular congestion and extensive bilateral interstitial and ground-glass opacity suspicious for pulmonary edema. Probable pleural effusions. 2. Basilar consolidations may reflect atelectasis or pneumonia. Electronically Signed   By: Donavan Foil M.D.   On: 09/26/2019 17:22   DG C-Arm 1-60 Min-No Report  Result Date: 09/26/2019 Fluoroscopy was utilized by the requesting physician.  No radiographic interpretation.   IR URETERAL STENT LEFT NEW ACCESS W/O SEP NEPHROSTOMY CATH  Result Date: 09/26/2019 INDICATION: Renal stones, access for left percutaneous nephrolithotomy. EXAM: 1. Percutaneous puncture of the renal collecting system under fluoroscopic guidance 2. Placement of a percutaneous nephrostomy tube  Operating Physician:  Criselda Peaches, MD COMPARISON:  CT of the abdomen and pelvis-07/11/2019 MEDICATIONS: 2 g Ancef; The antibiotic was administered in an appropriate time frame prior to skin puncture. ANESTHESIA/SEDATION: Fentanyl 200 mcg IV; Versed 12.5 mg IV Moderate Sedation Time:  32 minutes The patient was continuously monitored during the procedure by the interventional radiology nurse under my direct supervision. CONTRAST:  51mL OMNIPAQUE IOHEXOL 300 MG/ML SOLN - administered into the collecting system(s) FLUOROSCOPY TIME:  Fluoroscopy Time: 7 minutes 42 seconds (182 mGy). COMPLICATIONS: None immediate. PROCEDURE: Informed written consent was obtained from the patient after a thorough discussion of the procedural risks, benefits and alternatives. All questions were addressed. Maximal Sterile Barrier Technique was utilized including caps, mask, sterile gowns, sterile gloves, sterile drape, hand hygiene and skin antiseptic. A timeout was performed prior to the initiation of the procedure. A pre procedural spot fluoroscopic image was obtained of the upper abdomen. The stone within the upper pole infundibulum was targeted fluoroscopically with a [place] needle. Access to the collecting system was confirmed with advancement of a Nitrex wire into the collecting system. The needle was exchanged for the inner 3 French catheter from an [place] and contrast injection confirmed access. Percutaneous nephrostogram demonstrates marked hydronephrosis of the upper pole collecting system secondary to an obstructing stone mass in the upper pole infundibulum. There are additional stones within calices in the interpolar region of the kidney. The lower pole infundibulum and calices also contain numerous stones, however there is no hydronephrosis. The renal pelvis is very small. A small amount of air was injected into the collecting system to help delineate a posterior calyx. A stone containing interpolar calyx  was targeted with a Graham needle. Access to the calyx was confirmed with advancement of a Nitrex wire into the collecting system. An [place] set was utilized to dilate the tract and was subsequently exchanged for a Kumpe catheter over a Bentson wire. The Kumpe catheter was advanced down the ureter and into the urinary bladder. Postprocedural spot radiographs were obtained in various obliquities and the catheter was sutured to the skin. The catheter was capped and a dressing was placed. The patient tolerated the procedure well without immediate postprocedural complication. IMPRESSION: 1. Obstructing staghorn calculus in the upper pole infundibulum with resultant significant hydronephrosis of the upper pole and interpolar collecting system. 2. Successful percutaneous access of a stone containing posterior interpolar calyx with advancement of a 5 French catheter past  the upper pole infundibulum, through the renal pelvis, down the ureter and into the bladder. Electronically Signed   By: Jacqulynn Cadet M.D.   On: 09/26/2019 14:37    Disposition: Discharge disposition: 01-Home or Self Care         Follow-up Information    Irine Seal, MD Follow up.   Specialty: Urology Why: Please call for f/u for 2-3 weeks from discharge. Contact information: [address] [place] [address] [phone]            Signed: Irine Seal 09/30/2019, 2:23 PM

## 2019-10-02 NOTE — Telephone Encounter (Signed)
Hospitalized and managed. I have been out of loop on his care. Seems [facility] clinic is caring for him predominantly.

## 2019-10-03 ENCOUNTER — Other Ambulatory Visit: Payer: Self-pay | Admitting: Family Medicine

## 2019-10-03 NOTE — Telephone Encounter (Signed)
It appears patient has already proceeded with the surgery, will remove from pool

## 2019-10-05 LAB — CALCULI, WITH PHOTOGRAPH (CLINICAL LAB)
Calcium Oxalate Dihydrate: 10 %
Calcium Oxalate Monohydrate: 90 %
Weight Calculi: 129 mg

## 2019-10-06 ENCOUNTER — Ambulatory Visit: Payer: Medicare Other | Admitting: Urology

## 2019-10-06 ENCOUNTER — Other Ambulatory Visit: Payer: Self-pay | Admitting: Family Medicine

## 2019-10-06 ENCOUNTER — Other Ambulatory Visit (HOSPITAL_COMMUNITY): Payer: Self-pay

## 2019-10-06 DIAGNOSIS — R809 Proteinuria, unspecified: Secondary | ICD-10-CM | POA: Diagnosis not present

## 2019-10-06 DIAGNOSIS — Z79899 Other long term (current) drug therapy: Secondary | ICD-10-CM | POA: Diagnosis not present

## 2019-10-06 DIAGNOSIS — N184 Chronic kidney disease, stage 4 (severe): Secondary | ICD-10-CM | POA: Diagnosis not present

## 2019-10-06 DIAGNOSIS — D631 Anemia in chronic kidney disease: Secondary | ICD-10-CM | POA: Diagnosis not present

## 2019-10-10 NOTE — Progress Notes (Signed)
Cardiac Individual Treatment Plan  Patient Details  Name: [name] MRN: [id number] Date of Birth: 01-17-1956 Referring Provider:     CARDIAC REHAB [place] from 06/19/2019 in [place]  Referring Provider  [name]      Initial Encounter Date:    CARDIAC REHAB PHASE II ORIENTATION from 06/19/2019 in [place]  Date  06/19/19      Visit Diagnosis: ST elevation myocardial infarction (STEMI), unspecified artery [facility])  Status post coronary artery stent placement  Patient's Home Medications on Admission:  Current Outpatient Medications:  .  BIDIL 20-37.5 MG tablet, Take 1 tablet by mouth twice daily, Disp: 180 tablet, Rfl: 3 .  allopurinol (ZYLOPRIM) 100 MG tablet, Take 100 mg by mouth daily. , Disp: , Rfl:  .  aspirin 81 MG chewable tablet, Chew 1 tablet (81 mg total) by mouth daily., Disp: 90 tablet, Rfl: 1 .  atorvastatin (LIPITOR) 80 MG tablet, Take 1 tablet (80 mg total) by mouth daily at 6 PM., Disp: 90 tablet, Rfl: 1 .  carvedilol (COREG) 12.5 MG tablet, TAKE 1 TABLET BY MOUTH TWICE DAILY WITH MEALS (Patient taking differently: Take 12.5 mg by mouth 2 (two) times daily with a meal. ), Disp: 180 tablet, Rfl: 3 .  furosemide (LASIX) 40 MG tablet, TAKE 1 TABLET BY MOUTH ONCE DAILY AS NEEDED FOR FLUID (Patient taking differently: Take 40 mg by mouth daily. ), Disp: 30 tablet, Rfl: 5 .  HYDROcodone-acetaminophen (NORCO/VICODIN) 5-325 MG tablet, Take 1 tablet by mouth every 4 (four) hours as needed for moderate pain., Disp: 8 tablet, Rfl: 0 .  ivabradine (CORLANOR) 5 MG TABS tablet, Take 1 tablet (5 mg total) by mouth 2 (two) times daily with a meal., Disp: 60 tablet, Rfl: 11 .  nitroGLYCERIN (NITROSTAT) 0.4 MG SL tablet, Place 1 tablet (0.4 mg total) under the tongue every 5 (five) minutes as needed., Disp: 25 tablet, Rfl: 2 .  sodium bicarbonate 650 MG tablet, Take 650 mg by mouth 3 (three) times daily., Disp: , Rfl:  .   ticagrelor (BRILINTA) 90 MG TABS tablet, Take 1 tablet (90 mg total) by mouth 2 (two) times daily., Disp: 180 tablet, Rfl: 1  Past Medical History: Past Medical History:  Diagnosis Date  . Acute ST elevation myocardial infarction (STEMI) due to occlusion of mid portion of left anterior descending (LAD) coronary artery ([place]) 03/12/2019  . Acute systolic heart failure ([place])   . Arthritis   . CHF (congestive heart failure) ([place])   . CKD (chronic kidney disease) stage 4, GFR 15-29 ml/min (HCC)   . Dyspnea    with exertionm periodically  . Heart attack ([place]) 03/15/2019   anterior MI   . History of kidney stones   . Hyperparathyroidism ([place])   . Hypertension 2002  . Ischemic cardiomyopathy   . Left knee pain   . Syncope 04/04/2019   after blood draw  . Vitamin D deficiency     Tobacco Use: Social History   Tobacco Use  Smoking Status Former Smoker  . Packs/day: 0.50  . Years: 40.00  . Pack years: 20.00  . Types: Cigarettes  . Quit date: 03/12/2019  . Years since quitting: 0.5  Smokeless Tobacco Never Used    Labs: Recent Chemical engineer    Labs for ITP Cardiac and Pulmonary Rehab Latest Ref Rng & Units 05/28/2016 03/12/2019 03/13/2019 09/26/2019   Cholestrol 0 - 200 mg/dL 204(H) 193 - -   LDLCALC 0 -  99 mg/dL 121(H) 114(H) - -   HDL >40 mg/dL 48 42 - -   Trlycerides <150 mg/dL 173(H) 184(H) - -   Hemoglobin A1c 4.8 - 5.6 % 5.0 - 5.4 -   PHART 7.350 - 7.450 - - - 7.282(L)   PCO2ART 32.0 - 48.0 mmHg - - - 42.9   HCO3 20.0 - 28.0 mmol/L - - - 19.6(L)   TCO2 22 - 32 mmol/L - 20(L) - -   ACIDBASEDEF 0.0 - 2.0 mmol/L - - - 6.2(H)   O2SAT % - - - 87.8      Capillary Blood Glucose: Lab Results  Component Value Date   GLUCAP 115 (H) 04/04/2019   GLUCAP 168 (H) 03/12/2019   GLUCAP 92 05/19/2016     Exercise Target Goals: Exercise Program Goal: Individual exercise prescription set using results from initial 6 min walk test and THRR while considering  patient's  activity barriers and safety.   Exercise Prescription Goal: Starting with aerobic activity 30 plus minutes a day, 3 days per week for initial exercise prescription. Provide home exercise prescription and guidelines that participant acknowledges understanding prior to discharge.  Activity Barriers & Risk Stratification: Activity Barriers & Cardiac Risk Stratification - 06/19/19 1134      Activity Barriers & Cardiac Risk Stratification   Activity Barriers  Deconditioning   (L) knee problems. He needs a replacement.   Cardiac Risk Stratification  High       6 Minute Walk: 6 Minute Walk    Row Name 06/19/19 1132         6 Minute Walk   Phase  Initial     Distance  1200 feet     Walk Time  6 minutes     # of Rest Breaks  0     MPH  2.27     METS  2.74     RPE  10     Perceived Dyspnea   11     VO2 Peak  12.31     Symptoms  No     Resting HR  83 bpm     Resting BP  90/62     Resting Oxygen Saturation   99 %     Exercise Oxygen Saturation  during 6 min walk  96 %     Max Ex. HR  114 bpm     Max Ex. BP  128/78     2 Minute Post BP  90/78        Oxygen Initial Assessment:   Oxygen Re-Evaluation:   Oxygen Discharge (Final Oxygen Re-Evaluation):   Initial Exercise Prescription: Initial Exercise Prescription - 06/19/19 1100      Date of Initial Exercise RX and Referring Provider   Date  06/19/19    Referring Provider  [name]    Expected Discharge Date  09/16/19      Treadmill   MPH  1.3    Grade  0    Minutes  17    METs  1.99      Recumbant Elliptical   Level  1    RPM  40    Watts  28    Minutes  20    METs  2.2      Prescription Details   Frequency (times per week)  3    Duration  Progress to 30 minutes of continuous aerobic without signs/symptoms of physical distress      Intensity   THRR 40-80% of Max Heartrate  [phone]    Ratings of Perceived Exertion  11-13    Perceived Dyspnea  0-4      Progression   Progression  Continue to  progress workloads to maintain intensity without signs/symptoms of physical distress.      Resistance Training   Training Prescription  Yes    Weight  1    Reps  10-15       Perform Capillary Blood Glucose checks as needed.  Exercise Prescription Changes: Exercise Prescription Changes    Row Name 07/04/19 1500 07/05/19 1400 07/25/19 0700 08/16/19 1500 08/21/19 1400     Response to Exercise   Blood Pressure (Admit)  110/88  118/100  100/60  102/60  100/70   Blood Pressure (Exercise)  110/80  122/102  110/70  110/78  100/62   Blood Pressure (Exit)  104/84  -  94/60  106/70  118/70   Heart Rate (Admit)  90 bpm  98 bpm  84 bpm  73 bpm  95 bpm   Heart Rate (Exercise)  105 bpm  105 bpm  105 bpm  83 bpm  74 bpm   Heart Rate (Exit)  99 bpm  -  93 bpm  75 bpm  86 bpm   Rating of Perceived Exertion (Exercise)  10  -  -  11  11   Symptoms  Knee Pain 7/10  extreme SOB  Fluid retention  None  None   Comments  -  Due to his knee had to change Exercise Prescription to Arm Crank and Nustep  Due to his knee had to change Exercise Prescription to Arm Crank and Nustep  Due to his knee had to change Exercise Prescription to Arm Crank and Nustep  Patient will be out for a couple of weeks due to ICD placement   Duration  Continue with 30 min of aerobic exercise without signs/symptoms of physical distress.  Continue with 30 min of aerobic exercise without signs/symptoms of physical distress.  Continue with 30 min of aerobic exercise without signs/symptoms of physical distress.  Continue with 30 min of aerobic exercise without signs/symptoms of physical distress.  Continue with 30 min of aerobic exercise without signs/symptoms of physical distress.   Intensity  THRR unchanged  THRR unchanged  THRR unchanged  THRR unchanged  THRR unchanged     Progression   Progression  Continue to progress workloads to maintain intensity without signs/symptoms of physical distress.  Continue to progress workloads to maintain  intensity without signs/symptoms of physical distress.  Continue to progress workloads to maintain intensity without signs/symptoms of physical distress.  Continue to progress workloads to maintain intensity without signs/symptoms of physical distress.  Continue to progress workloads to maintain intensity without signs/symptoms of physical distress.   Average METs  1.65  1.65  2.07  2.02  2.05     Resistance Training   Training Prescription  Yes  Yes  Yes  Yes  Yes   Weight  2  2  2  3  3    Reps  10-15  10-15  10-15  10-15  10-15     Treadmill   MPH  1.4  -  1.4  1.5  1.5   Grade  0  -  0  0  0   Minutes  17  -  17  17  17    METs  2.07  -  2.07  2.14  2.14     Arm Ergometer   Level  -  1  1.2  1.3  1.3   Watts  -  25  11  13  13    Minutes  -  17  22  22  22    METs  -  2  1.8  1.9  1.9     Recumbant Elliptical   Level  1  -  -  -  -   RPM  38  -  -  -  -   Watts  46  -  -  -  -   Minutes  22  -  -  -  -   METs  2.5  -  -  -  -     T5 Nustep   Level  -  1  -  -  -   SPM  -  50  -  -  -   Minutes  -  22  -  -  -   METs  -  2  -  -  -     Home Exercise Plan   Plans to continue exercise at  Home (comment)  Home (comment)  Home (comment)  Home (comment)  Home (comment)   Frequency  Add 2 additional days to program exercise sessions.  Add 2 additional days to program exercise sessions.  Add 2 additional days to program exercise sessions.  Add 2 additional days to program exercise sessions.  Add 2 additional days to program exercise sessions.   Initial Home Exercises Provided  06/19/19  06/19/19  06/19/19  06/19/19  06/19/19      Exercise Comments: Exercise Comments    Row Name 06/19/19 1147 07/04/19 1531 07/25/19 0801 08/16/19 1547 08/21/19 1452   Exercise Comments  This was patient orientation/assessment visit. He is looking forward to attending to continue to get stronger and to get mobility back to 100%.  This is patients 4th visit. He is coming regularily and was progressing.  Will have to watch his knee pain and if the current exercise prescription needs to be ajusted to elivate his current knee pain and swelling.  We will continue to progress his workload as tolerated.  Patient is scheduled to have a pacemaker implanted on 08/24/19. He will get his COVID-19 test on 4/19 and then will not return to [facility] until to released by his cardiologist.  Patient will not be able to be progressed for about 2 weeks or so due to having ICD placement surgery on 08/21/19. Once he returns we will continue to progress him as tolerated.   [place] Name 09/08/19 1514 09/29/19 1132         Exercise Comments  Patient has been out since 08/18/19 due to having pace maker placement surgery. Patient came in to inform us that he will return after his kidney stone surgery. He plans to return on 09/25/19. We will progress as tolerated when he returns.  Patient has not been in the program since 08/18/2019 which is 15 session due to having surgery. We will contact patient to decide if he needs to come back or completely start over. We will not progress patient any further untill we can determine what our next steps are for him.         Exercise Goals and Review: Exercise Goals    Row Name 06/19/19 1146             Exercise Goals   Increase Physical Activity  Yes       Intervention  Provide advice, education, support  and counseling about physical activity/exercise needs.;Develop an individualized exercise prescription for aerobic and resistive training based on initial evaluation findings, risk stratification, comorbidities and participant's personal goals.       Expected Outcomes  Long Term: Add in home exercise to make exercise part of routine and to increase amount of physical activity.;Short Term: Attend rehab on a regular basis to increase amount of physical activity.       Increase Strength and Stamina  Yes       Intervention  Provide advice, education, support and counseling about physical  activity/exercise needs.;Develop an individualized exercise prescription for aerobic and resistive training based on initial evaluation findings, risk stratification, comorbidities and participant's personal goals.       Expected Outcomes  Short Term: Perform resistance training exercises routinely during rehab and add in resistance training at home;Long Term: Improve cardiorespiratory fitness, muscular endurance and strength as measured by increased METs and functional capacity (6MWT)       Able to understand and use rate of perceived exertion (RPE) scale  Yes       Intervention  Provide education and explanation on how to use RPE scale       Expected Outcomes  Short Term: Able to use RPE daily in rehab to express subjective intensity level;Long Term:  Able to use RPE to guide intensity level when exercising independently       Able to understand and use Dyspnea scale  Yes       Intervention  Provide education and explanation on how to use Dyspnea scale       Expected Outcomes  Short Term: Able to use Dyspnea scale daily in rehab to express subjective sense of shortness of breath during exertion;Long Term: Able to use Dyspnea scale to guide intensity level when exercising independently       Knowledge and understanding of Target Heart Rate Range (THRR)  Yes       Intervention  Provide education and explanation of THRR including how the numbers were predicted and where they are located for reference       Expected Outcomes  Short Term: Able to use daily as guideline for intensity in rehab;Long Term: Able to use THRR to govern intensity when exercising independently       Able to check pulse independently  Yes       Intervention  Provide education and demonstration on how to check pulse in carotid and radial arteries.;Review the importance of being able to check your own pulse for safety during independent exercise       Expected Outcomes  Short Term: Able to explain why pulse checking is important  during independent exercise;Long Term: Able to check pulse independently and accurately       Understanding of Exercise Prescription  Yes       Intervention  Provide education, explanation, and written materials on patient's individual exercise prescription       Expected Outcomes  Short Term: Able to explain program exercise prescription;Long Term: Able to explain home exercise prescription to exercise independently          Exercise Goals Re-Evaluation : Exercise Goals Re-Evaluation    Row Name 07/04/19 1527 07/25/19 0758 08/16/19 1546 08/21/19 1449       Exercise Goal Re-Evaluation   Exercise Goals Review  Increase Physical Activity;Increase Strength and Stamina  -  Increase Physical Activity;Increase Strength and Stamina  Knowledge and understanding of Target Heart Rate Range (THRR)    Comments  On  patients last visit he c/o knee pain and swelling 7/10. We may have to switch the equipment that he is using if the TM and Ellipical continue to aggrivate his knee. Will cintinue to assess.  This is patients 9th visit in the program. He has been retaining fluid on a regular basis. He has had 2 trips to the ER since starting the program due to fluid retenion. He has been instructed on ways in which to change his diet to help to keep his fluid down. He has expressed an understanding.  Progress as tolerated  Patient goals are to get mobility back 100% and to keep it. Due to his poor attendance he may not reach these goals. We will continue to encourage him to come on a more regular basis so that he can improve his mobility.    Expected Outcomes  To be able to progress as tolerated.  To keep his fluid at a healthful level so that he does not experienced SOB.  Get get mobility back to 100%  To improve his mobility.        Discharge Exercise Prescription (Final Exercise Prescription Changes): Exercise Prescription Changes - 08/21/19 1400      Response to Exercise   Blood Pressure (Admit)  100/70     Blood Pressure (Exercise)  100/62    Blood Pressure (Exit)  118/70    Heart Rate (Admit)  95 bpm    Heart Rate (Exercise)  74 bpm    Heart Rate (Exit)  86 bpm    Rating of Perceived Exertion (Exercise)  11    Symptoms  None    Comments  Patient will be out for a couple of weeks due to ICD placement    Duration  Continue with 30 min of aerobic exercise without signs/symptoms of physical distress.    Intensity  THRR unchanged      Progression   Progression  Continue to progress workloads to maintain intensity without signs/symptoms of physical distress.    Average METs  2.05      Resistance Training   Training Prescription  Yes    Weight  3    Reps  10-15      Treadmill   MPH  1.5    Grade  0    Minutes  17    METs  2.14      Arm Ergometer   Level  1.3    Watts  13    Minutes  22    METs  1.9      Home Exercise Plan   Plans to continue exercise at  Home (comment)    Frequency  Add 2 additional days to program exercise sessions.    Initial Home Exercises Provided  06/19/19       Nutrition:  Target Goals: Understanding of nutrition guidelines, daily intake of sodium 1500mg , cholesterol 200mg , calories 30% from fat and 7% or less from saturated fats, daily to have 5 or more servings of fruits and vegetables.  Biometrics: Pre Biometrics - 06/19/19 1148      Pre Biometrics   Height  6' (1.829 m)    Weight  86.5 kg    Waist Circumference  40.5 inches    Hip Circumference  38 inches    Waist to Hip Ratio  1.07 %    BMI (Calculated)  25.86    Triceps Skinfold  6 mm    % Body Fat  22.9 %    Grip Strength  37.9  kg    Flexibility  0 in    Single Leg Stand  15.37 seconds        Nutrition Therapy Plan and Nutrition Goals: Nutrition Therapy & Goals - 08/21/19 1507      Personal Nutrition Goals   Comments  We are currently working on scheduling patients to meet with RD. Patient is supposed to be following a low Na diet and says he is trying but admitts to not always  complying. He says he is working on portion sizes and trying to "eat less of everything".  Will encourage to meet with RD and continue to monitor for progress.      Intervention Plan   Intervention  Nutrition handout(s) given to patient.       Nutrition Assessments: Nutrition Assessments - 06/19/19 1151      MEDFICTS Scores   Pre Score  3       Nutrition Goals Re-Evaluation:   Nutrition Goals Discharge (Final Nutrition Goals Re-Evaluation):   Psychosocial: Target Goals: Acknowledge presence or absence of significant depression and/or stress, maximize coping skills, provide positive support system. Participant is able to verbalize types and ability to use techniques and skills needed for reducing stress and depression.  Initial Review & Psychosocial Screening: Initial Psych Review & Screening - 06/19/19 1150      Initial Review   Current issues with  None Identified      Family Dynamics   Good Support System?  Yes      Barriers   Psychosocial barriers to participate in program  There are no identifiable barriers or psychosocial needs.      Screening Interventions   Interventions  Encouraged to exercise       Quality of Life Scores: Quality of Life - 06/19/19 1026      Quality of Life   Select  Quality of Life      Quality of Life Scores   Health/Function Pre  23.67 %    Socioeconomic Pre  25 %    Psych/Spiritual Pre  27.92 %    Family Pre  21.75 %    GLOBAL Pre  24.54 %      Scores of 19 and below usually indicate a poorer quality of life in these areas.  A difference of  2-3 points is a clinically meaningful difference.  A difference of 2-3 points in the total score of the Quality of Life Index has been associated with significant improvement in overall quality of life, self-image, physical symptoms, and general health in studies assessing change in quality of life.  PHQ-9: Recent Review Flowsheet Data    Depression screen [facility] 2/9 07/27/2019 06/19/2019  04/11/2019 03/21/2019   Decreased Interest 0 0 0 0   Down, Depressed, Hopeless 0 0 0 0   PHQ - 2 Score 0 0 0 0   Altered sleeping - 0 - -   Tired, decreased energy - 0 - -   Change in appetite - 0 - -   Feeling bad or failure about yourself  - 0 - -   Trouble concentrating - 0 - -   Moving slowly or fidgety/restless - 0 - -   Suicidal thoughts - 0 - -   PHQ-9 Score - 0 - -     Interpretation of Total Score  Total Score Depression Severity:  1-4 = Minimal depression, 5-9 = Mild depression, 10-14 = Moderate depression, 15-19 = Moderately severe depression, 20-27 = Severe depression   Psychosocial Evaluation and  Intervention:   Psychosocial Re-Evaluation: Psychosocial Re-Evaluation    [place] Name 07/05/19 1026 07/26/19 1608 08/21/19 1508         Psychosocial Re-Evaluation   Current issues with  None Identified  None Identified  None Identified     Comments  Patient's initial QOL score ws 24.54 and his PHQ-9 score was 0 with no psychosocial issued identified.  Patient's initial QOL score ws 24.54 and his PHQ-9 score was 0 with no psychosocial issued identified.  Patient's initial QOL score ws 24.54 and his PHQ-9 score was 0 with no psychosocial issued identified.     Expected Outcomes  Patient will have no psychosocial issued identified at discharge.  Patient will have no psychosocial issued identified at discharge.  Patient will have no psychosocial issued identified at discharge.     Interventions  Stress management education;Encouraged to attend Cardiac Rehabilitation for the exercise;Relaxation education  Stress management education;Encouraged to attend Cardiac Rehabilitation for the exercise;Relaxation education  Stress management education;Encouraged to attend Cardiac Rehabilitation for the exercise;Relaxation education     Continue Psychosocial Services   No Follow up required  No Follow up required  No Follow up required        Psychosocial Discharge (Final Psychosocial  Re-Evaluation): Psychosocial Re-Evaluation - 08/21/19 1508      Psychosocial Re-Evaluation   Current issues with  None Identified    Comments  Patient's initial QOL score ws 24.54 and his PHQ-9 score was 0 with no psychosocial issued identified.    Expected Outcomes  Patient will have no psychosocial issued identified at discharge.    Interventions  Stress management education;Encouraged to attend Cardiac Rehabilitation for the exercise;Relaxation education    Continue Psychosocial Services   No Follow up required       Vocational Rehabilitation: Provide vocational rehab assistance to qualifying candidates.   Vocational Rehab Evaluation & Intervention: Vocational Rehab - 06/19/19 1151      Initial Vocational Rehab Evaluation & Intervention   Assessment shows need for Vocational Rehabilitation  No       Education: Education Goals: Education classes will be provided on a weekly basis, covering required topics. Participant will state understanding/return demonstration of topics presented.  Learning Barriers/Preferences: Learning Barriers/Preferences - 06/19/19 1151      Learning Barriers/Preferences   Learning Barriers  None    Learning Preferences  Individual Instruction;Group Instruction;Skilled Demonstration       Education Topics: Hypertension, Hypertension Reduction -Define heart disease and high blood pressure. Discus how high blood pressure affects the body and ways to reduce high blood pressure.   Exercise and Your Heart -Discuss why it is important to exercise, the FITT principles of exercise, normal and abnormal responses to exercise, and how to exercise safely.   CARDIAC REHAB PHASE II EXERCISE from 08/16/2019 in [place]  Date  08/16/19  Educator  DC  Instruction Review Code  2- Demonstrated Understanding      Angina -Discuss definition of angina, causes of angina, treatment of angina, and how to decrease risk of having  angina.   Cardiac Medications -Review what the following cardiac medications are used for, how they affect the body, and side effects that may occur when taking the medications.  Medications include Aspirin, Beta blockers, calcium channel blockers, ACE Inhibitors, angiotensin receptor blockers, diuretics, digoxin, and antihyperlipidemics.   Congestive Heart Failure -Discuss the definition of CHF, how to live with CHF, the signs and symptoms of CHF, and how keep track of weight and sodium intake.  Heart Disease and Intimacy -Discus the effect sexual activity has on the heart, how changes occur during intimacy as we age, and safety during sexual activity.   Smoking Cessation / COPD -Discuss different methods to quit smoking, the health benefits of quitting smoking, and the definition of COPD.   Nutrition I: Fats -Discuss the types of cholesterol, what cholesterol does to the heart, and how cholesterol levels can be controlled.   Nutrition II: Labels -Discuss the different components of food labels and how to read food label   CARDIAC REHAB PHASE II EXERCISE from 08/16/2019 in [place]  Date  07/05/19  Educator  Etheleen Mayhew  Instruction Review Code  2- Demonstrated Understanding      Heart Parts/Heart Disease and PAD -Discuss the anatomy of the heart, the pathway of blood circulation through the heart, and these are affected by heart disease.   Stress I: Signs and Symptoms -Discuss the causes of stress, how stress may lead to anxiety and depression, and ways to limit stress.   CARDIAC REHAB PHASE II EXERCISE from 08/16/2019 in [place]  Date  07/19/19  Educator  Etheleen Mayhew  Instruction Review Code  2- Demonstrated Understanding      Stress II: Relaxation -Discuss different types of relaxation techniques to limit stress.   Warning Signs of Stroke / TIA -Discuss definition of a stroke, what the signs and symptoms are of a  stroke, and how to identify when someone is having stroke.   Knowledge Questionnaire Score: Knowledge Questionnaire Score - 06/19/19 1151      Knowledge Questionnaire Score   Pre Score  18/24       Core Components/Risk Factors/Patient Goals at Admission: Personal Goals and Risk Factors at Admission - 06/19/19 1151      Core Components/Risk Factors/Patient Goals on Admission    Weight Management  Weight Maintenance    Personal Goal Other  Yes    Personal Goal  Patient wants to get mobility back to 100% and to be able to maintain that level of strength.    Intervention  Atten CR 3 x week and to supplement with walking 2 x week at home.    Expected Outcomes  Reach expected outcomes       Core Components/Risk Factors/Patient Goals Review:  Goals and Risk Factor Review    Row Name 07/05/19 1021 07/26/19 1601 08/21/19 1501 09/11/19 1501       Core Components/Risk Factors/Patient Goals Review   Personal Goals Review  Weight Management/Obesity;Other Get mobility back 100%; keep mobility.  Weight Management/Obesity;Other Get mobility back to 100%. Keep his mobility.  Weight Management/Obesity;Other Get mobility back 100%; keep his mobility.  Weight Management/Obesity;Other Get mobility back 100%; keep his mobility.    Review  Patient has completed 5 sessions gaining 5 lbs since his initial visit. He is new to the program. He complained of SOB today during his session. He has an echocardiogram completed today. Dr. Letha Cape office was notified and patient was advised to go to the ED. Will continue to monitor patient's progress.  Patient has completed 9 sessions with fluctuating weight. He was not allowed to complete his session 07/21/19 due to a 10 lb weight gain in one week and noted and reported SOB. Patient was advised to call the HF clinic when he got home and report his weight gain as he had been instructed. He choose to go to the ED due to his increased SOB after he left CR. He was  evaluated and treated with IV Furosemide with improvement in symptoms. Patient is to f/u in HF clinic tomorrow 3/25. He admitts to adding salt to his food over the weekend and has been instructed on a low salt diet. He plans to return to the program 07/31/19. Will continue to monitor for progress toward meeting his goals once he returns.  Patient has completed 15 sessions with continued fluctuating weight. Patient's attendance has been inconsistent due to doctor's appointments and his 2 ED visits which required follow up MD appointments before he could return which is a barrier to his progress in the program. He is scheduled to have an ICD implanted 08/24/19 and will be out until MD clearance to return. Will continue to monitor for progress.  Patient has not attended since last 30 day review due to having an ICD implanted 08/24/19. He has been released by his MD to return but he is having a renal stone removed in late May. He plans to return 09/18/19. He plans to have a TKR 11/01/19. We informed him that he will be discharged near the end of June. He verbalized understanding and is okay with this plan. Will continue to monitor for progress.    Expected Outcomes  Patient will continue to attend sessions and complete the program meeting his personal goals.  Patient will continue to attend sessions and complete the program meeting his personal goals.  Patient will continue to attend sessions and complete the program meeting his personal goals.  Patient will continue to attend sessions and complete the program meeting his personal goals.       Core Components/Risk Factors/Patient Goals at Discharge (Final Review):  Goals and Risk Factor Review - 09/11/19 1501      Core Components/Risk Factors/Patient Goals Review   Personal Goals Review  Weight Management/Obesity;Other   Get mobility back 100%; keep his mobility.   Review  Patient has not attended since last 30 day review due to having an ICD implanted 08/24/19. He  has been released by his MD to return but he is having a renal stone removed in late May. He plans to return 09/18/19. He plans to have a TKR 11/01/19. We informed him that he will be discharged near the end of June. He verbalized understanding and is okay with this plan. Will continue to monitor for progress.    Expected Outcomes  Patient will continue to attend sessions and complete the program meeting his personal goals.       ITP Comments: ITP Comments    Row Name 06/19/19 1131 07/14/19 0909 08/07/19 1415 10/10/19 0811     ITP Comments  Patient is eager to get started. He is coming by way of [facility].  Patient was evaluated in the ED 07/05/19 for SOB and diagnosed with acute on chronic CHF. He was instructed to f/u with the HF clinic which was 3/10. They increased his Lasix to 40 mg daily. Will continue to monitor.  Patient seen in HF clinic 08/01/19 as ED follow-up. They added Ivabradine 5 mg BID for tachycardia and Prednisone 40 mg for 3 days for gout pain in his ankles. They also advised him on his diet to avoid all salt and fried foods and eat more fruits and veggies. Will continue to monitor for progress.  Patient stopped attending sessions 08/18/19 due to having procedures scheduled and has to have a total knee replacement soon. He completed 15 sessions. MD will be notified.       Comments: Patient stopped attending  sessions 08/18/19 due to having procedures scheduled and has to have a total knee replacement soon. He completed 15 sessions. MD will be notified.

## 2019-10-10 NOTE — Addendum Note (Signed)
Encounter addended by: Dwana Melena, RN on: 10/10/2019 8:17 AM  Actions taken: Flowsheet accepted, Clinical Note Signed, Episode resolved

## 2019-10-10 NOTE — Progress Notes (Signed)
Discharge Progress Report  Patient Details  Name: [name] MRN: [id number] Date of Birth: 22-Apr-1956 Referring Provider:     CARDIAC REHAB PHASE II ORIENTATION from 06/19/2019 in [place]  Referring Provider  [name]       Number of Visits: 15  Reason for Discharge:  Early Exit:  Personal  Smoking History:  Social History   Tobacco Use  Smoking Status Former Smoker  . Packs/day: 0.50  . Years: 40.00  . Pack years: 20.00  . Types: Cigarettes  . Quit date: 03/12/2019  . Years since quitting: 0.5  Smokeless Tobacco Never Used    Diagnosis:  ST elevation myocardial infarction (STEMI), unspecified artery (HCC)  Status post coronary artery stent placement  ADL [facility]:   Initial Exercise Prescription: Initial Exercise Prescription - 06/19/19 1100      Date of Initial Exercise RX and Referring Provider   Date  06/19/19    Referring Provider  [name]    Expected Discharge Date  09/16/19      Treadmill   MPH  1.3    Grade  0    Minutes  17    METs  1.99      Recumbant Elliptical   Level  1    RPM  40    Watts  28    Minutes  20    METs  2.2      Prescription Details   Frequency (times per week)  3    Duration  Progress to 30 minutes of continuous aerobic without signs/symptoms of physical distress      Intensity   THRR 40-80% of Max Heartrate  [id number]    Ratings of Perceived Exertion  11-13    Perceived Dyspnea  0-4      Progression   Progression  Continue to progress workloads to maintain intensity without signs/symptoms of physical distress.      Resistance Training   Training Prescription  Yes    Weight  1    Reps  10-15       Discharge Exercise Prescription (Final Exercise Prescription Changes): Exercise Prescription Changes - 08/21/19 1400      Response to Exercise   Blood Pressure (Admit)  100/70    Blood Pressure (Exercise)  100/62    Blood Pressure (Exit)  118/70    Heart Rate (Admit)  95 bpm     Heart Rate (Exercise)  74 bpm    Heart Rate (Exit)  86 bpm    Rating of Perceived Exertion (Exercise)  11    Symptoms  None    Comments  Patient will be out for a couple of weeks due to ICD placement    Duration  Continue with 30 min of aerobic exercise without signs/symptoms of physical distress.    Intensity  THRR unchanged      Progression   Progression  Continue to progress workloads to maintain intensity without signs/symptoms of physical distress.    Average METs  2.05      Resistance Training   Training Prescription  Yes    Weight  3    Reps  10-15      Treadmill   MPH  1.5    Grade  0    Minutes  17    METs  2.14      Arm Ergometer   Level  1.3    Watts  13    Minutes  22    METs  1.9  Home Exercise Plan   Plans to continue exercise at  Home (comment)    Frequency  Add 2 additional days to program exercise sessions.    Initial Home Exercises Provided  06/19/19       Functional Capacity: 6 Minute Walk    Row Name 06/19/19 1132         6 Minute Walk   Phase  Initial     Distance  1200 feet     Walk Time  6 minutes     # of Rest Breaks  0     MPH  2.27     METS  2.74     RPE  10     Perceived Dyspnea   11     VO2 Peak  12.31     Symptoms  No     Resting HR  83 bpm     Resting BP  90/62     Resting Oxygen Saturation   99 %     Exercise Oxygen Saturation  during 6 min walk  96 %     Max Ex. HR  114 bpm     Max Ex. BP  128/78     2 Minute Post BP  90/78        Psychological, QOL, Others - Outcomes: PHQ 2/9: Depression screen [facility] 2/9 07/27/2019 06/19/2019 04/11/2019 03/21/2019  Decreased Interest 0 0 0 0  Down, Depressed, Hopeless 0 0 0 0  PHQ - 2 Score 0 0 0 0  Altered sleeping - 0 - -  Tired, decreased energy - 0 - -  Change in appetite - 0 - -  Feeling bad or failure about yourself  - 0 - -  Trouble concentrating - 0 - -  Moving slowly or fidgety/restless - 0 - -  Suicidal thoughts - 0 - -  PHQ-9 Score - 0 - -    Quality of  Life: Quality of Life - 06/19/19 1026      Quality of Life   Select  Quality of Life      Quality of Life Scores   Health/Function Pre  23.67 %    Socioeconomic Pre  25 %    Psych/Spiritual Pre  27.92 %    Family Pre  21.75 %    GLOBAL Pre  24.54 %       Personal Goals: Goals established at orientation with interventions provided to work toward goal. Personal Goals and Risk Factors at Admission - 06/19/19 1151      Core Components/Risk Factors/Patient Goals on Admission    Weight Management  Weight Maintenance    Personal Goal Other  Yes    Personal Goal  Patient wants to get mobility back to 100% and to be able to maintain that level of strength.    Intervention  Atten CR 3 x week and to supplement with walking 2 x week at home.    Expected Outcomes  Reach expected outcomes        Personal Goals Discharge: Goals and Risk Factor Review    Row Name 07/05/19 1021 07/26/19 1601 08/21/19 1501 09/11/19 1501       Core Components/Risk Factors/Patient Goals Review   Personal Goals Review  Weight Management/Obesity;Other Get mobility back 100%; keep mobility.  Weight Management/Obesity;Other Get mobility back to 100%. Keep his mobility.  Weight Management/Obesity;Other Get mobility back 100%; keep his mobility.  Weight Management/Obesity;Other Get mobility back 100%; keep his mobility.    Review  Patient has  completed 5 sessions gaining 5 lbs since his initial visit. He is new to the program. He complained of SOB today during his session. He has an echocardiogram completed today. Dr. Letha Cape office was notified and patient was advised to go to the ED. Will continue to monitor patient's progress.  Patient has completed 9 sessions with fluctuating weight. He was not allowed to complete his session 07/21/19 due to a 10 lb weight gain in one week and noted and reported SOB. Patient was advised to call the HF clinic when he got home and report his weight gain as he had been instructed. He  choose to go to the ED due to his increased SOB after he left CR. He was evaluated and treated with IV Furosemide with improvement in symptoms. Patient is to f/u in HF clinic tomorrow 3/25. He admitts to adding salt to his food over the weekend and has been instructed on a low salt diet. He plans to return to the program 07/31/19. Will continue to monitor for progress toward meeting his goals once he returns.  Patient has completed 15 sessions with continued fluctuating weight. Patient's attendance has been inconsistent due to doctor's appointments and his 2 ED visits which required follow up MD appointments before he could return which is a barrier to his progress in the program. He is scheduled to have an ICD implanted 08/24/19 and will be out until MD clearance to return. Will continue to monitor for progress.  Patient has not attended since last 30 day review due to having an ICD implanted 08/24/19. He has been released by his MD to return but he is having a renal stone removed in late May. He plans to return 09/18/19. He plans to have a TKR 11/01/19. We informed him that he will be discharged near the end of June. He verbalized understanding and is okay with this plan. Will continue to monitor for progress.    Expected Outcomes  Patient will continue to attend sessions and complete the program meeting his personal goals.  Patient will continue to attend sessions and complete the program meeting his personal goals.  Patient will continue to attend sessions and complete the program meeting his personal goals.  Patient will continue to attend sessions and complete the program meeting his personal goals.       Exercise Goals and Review: Exercise Goals    Row Name 06/19/19 1146             Exercise Goals   Increase Physical Activity  Yes       Intervention  Provide advice, education, support and counseling about physical activity/exercise needs.;Develop an individualized exercise prescription for aerobic  and resistive training based on initial evaluation findings, risk stratification, comorbidities and participant's personal goals.       Expected Outcomes  Long Term: Add in home exercise to make exercise part of routine and to increase amount of physical activity.;Short Term: Attend rehab on a regular basis to increase amount of physical activity.       Increase Strength and Stamina  Yes       Intervention  Provide advice, education, support and counseling about physical activity/exercise needs.;Develop an individualized exercise prescription for aerobic and resistive training based on initial evaluation findings, risk stratification, comorbidities and participant's personal goals.       Expected Outcomes  Short Term: Perform resistance training exercises routinely during rehab and add in resistance training at home;Long Term: Improve cardiorespiratory fitness, muscular endurance and strength as  measured by increased METs and functional capacity (6MWT)       Able to understand and use rate of perceived exertion (RPE) scale  Yes       Intervention  Provide education and explanation on how to use RPE scale       Expected Outcomes  Short Term: Able to use RPE daily in rehab to express subjective intensity level;Long Term:  Able to use RPE to guide intensity level when exercising independently       Able to understand and use Dyspnea scale  Yes       Intervention  Provide education and explanation on how to use Dyspnea scale       Expected Outcomes  Short Term: Able to use Dyspnea scale daily in rehab to express subjective sense of shortness of breath during exertion;Long Term: Able to use Dyspnea scale to guide intensity level when exercising independently       Knowledge and understanding of Target Heart Rate Range (THRR)  Yes       Intervention  Provide education and explanation of THRR including how the numbers were predicted and where they are located for reference       Expected Outcomes  Short Term:  Able to use daily as guideline for intensity in rehab;Long Term: Able to use THRR to govern intensity when exercising independently       Able to check pulse independently  Yes       Intervention  Provide education and demonstration on how to check pulse in carotid and radial arteries.;Review the importance of being able to check your own pulse for safety during independent exercise       Expected Outcomes  Short Term: Able to explain why pulse checking is important during independent exercise;Long Term: Able to check pulse independently and accurately       Understanding of Exercise Prescription  Yes       Intervention  Provide education, explanation, and written materials on patient's individual exercise prescription       Expected Outcomes  Short Term: Able to explain program exercise prescription;Long Term: Able to explain home exercise prescription to exercise independently          Exercise Goals Re-Evaluation: Exercise Goals Re-Evaluation    Row Name 07/04/19 1527 07/25/19 0758 08/16/19 1546 08/21/19 1449       Exercise Goal Re-Evaluation   Exercise Goals Review  Increase Physical Activity;Increase Strength and Stamina  --  Increase Physical Activity;Increase Strength and Stamina  Knowledge and understanding of Target Heart Rate Range (THRR)    Comments  On patients last visit he c/o knee pain and swelling 7/10. We may have to switch the equipment that he is using if the TM and Ellipical continue to aggrivate his knee. Will cintinue to assess.  This is patients 9th visit in the program. He has been retaining fluid on a regular basis. He has had 2 trips to the ER since starting the program due to fluid retenion. He has been instructed on ways in which to change his diet to help to keep his fluid down. He has expressed an understanding.  Progress as tolerated  Patient goals are to get mobility back 100% and to keep it. Due to his poor attendance he may not reach these goals. We will continue to  encourage him to come on a more regular basis so that he can improve his mobility.    Expected Outcomes  To be able to progress as tolerated.  To keep his fluid at a healthful level so that he does not experienced SOB.  Get get mobility back to 100%  To improve his mobility.       Nutrition & Weight - Outcomes: Pre Biometrics - 06/19/19 1148      Pre Biometrics   Height  6' (1.829 m)    Weight  86.5 kg    Waist Circumference  40.5 inches    Hip Circumference  38 inches    Waist to Hip Ratio  1.07 %    BMI (Calculated)  25.86    Triceps Skinfold  6 mm    % Body Fat  22.9 %    Grip Strength  37.9 kg    Flexibility  0 in    Single Leg Stand  15.37 seconds        Nutrition: Nutrition Therapy & Goals - 08/21/19 1507      Personal Nutrition Goals   Comments  We are currently working on scheduling patients to meet with RD. Patient is supposed to be following a low Na diet and says he is trying but admitts to not always complying. He says he is working on portion sizes and trying to "eat less of everything".  Will encourage to meet with RD and continue to monitor for progress.      Intervention Plan   Intervention  Nutrition handout(s) given to patient.       Nutrition Discharge: Nutrition Assessments - 06/19/19 1151      MEDFICTS Scores   Pre Score  3       Education Questionnaire Score: Knowledge Questionnaire Score - 06/19/19 1151      Knowledge Questionnaire Score   Pre Score  18/24      Patient stopped attending sessions 08/18/19 due to having procedures scheduled and has to have a total knee replacement soon. He completed 15 sessions. MD will be notified.

## 2019-10-11 DIAGNOSIS — N200 Calculus of kidney: Secondary | ICD-10-CM | POA: Diagnosis not present

## 2019-10-11 DIAGNOSIS — N184 Chronic kidney disease, stage 4 (severe): Secondary | ICD-10-CM | POA: Diagnosis not present

## 2019-10-11 DIAGNOSIS — E872 Acidosis: Secondary | ICD-10-CM | POA: Diagnosis not present

## 2019-10-11 DIAGNOSIS — R809 Proteinuria, unspecified: Secondary | ICD-10-CM | POA: Diagnosis not present

## 2019-10-11 DIAGNOSIS — I129 Hypertensive chronic kidney disease with stage 1 through stage 4 chronic kidney disease, or unspecified chronic kidney disease: Secondary | ICD-10-CM | POA: Diagnosis not present

## 2019-10-11 DIAGNOSIS — E210 Primary hyperparathyroidism: Secondary | ICD-10-CM | POA: Diagnosis not present

## 2019-10-11 DIAGNOSIS — N189 Chronic kidney disease, unspecified: Secondary | ICD-10-CM | POA: Diagnosis not present

## 2019-10-11 DIAGNOSIS — D631 Anemia in chronic kidney disease: Secondary | ICD-10-CM | POA: Diagnosis not present

## 2019-10-19 NOTE — Progress Notes (Signed)
Subjective: 1. Hydronephrosis with renal and ureteral calculus obstruction   2. Microhematuria   3. Skin tag, acquired     Bilateral nephrolithiasis with a partial staghorn on the left and moderate left upper pole hydronephrosis with atrophy of the upper pole.  He is doing well s/p left PCNL.  He has a residual  64mm LUP stone that was in a calyx with a stenosed infundibulum that I was unable to access and has a 81mm collection of stones in the RLP.  He has some pain at the surgical site but one exam it appears to be secondary to a large skin tag that has some evidence of irritation.  His Cr is down to 2.98 from a high of 3.48 at the time of surgery.   His calcium is 10.4 and his PTH level was 225.     GU Hx: [name] is a 64yo AAM who is sent in consultation by Dr. Dicky Doe for bilateral nephrolithiasis found on a recent renal US for evaluation of CKD 4.  There is a 1.6cm RLP stone and a probable staghorn stone on the left with moderate hydronephrosis.  He had a repeat CT prior to this visit to better assess the stone burden and the findings were confirmed with left upper pole obstruction.   There was a 1.6cm LLP partial staghorn stone and an 29mm LMP stone and 1.2cm LUP stone with upper pole obstruction and atrophy.  Cysts were present bilateral on both the CT and renal US.   His recent Cr is 2.78 which is stable over the past 3 months but down from the high.  His Calcium is 10.7 and he says he is being evaluated for hyperparathyroidism.   He has no pain.  He has no hematuria. He has had prior stones and had a lithotripsy several years ago.  He is currently on Brilinta for a coronary stent that was placed in November and is going to be off of it from placement of a defibrillator next week.  He will continue an 81mg  ASA.  .   ROS:  Review of Systems  Respiratory: Positive for shortness of breath (mild and infrequent).   Cardiovascular: Negative for chest pain.  All other systems reviewed  and are negative.   IPSS    Row Name 10/20/19 1000         International Prostate Symptom Score   How often have you had the sensation of not emptying your bladder? Not at All     How often have you had to urinate less than every two hours? Not at All     How often have you found you stopped and started again several times when you urinated? Not at All     How often have you found it difficult to postpone urination? Not at All     How often have you had a weak urinary stream? Not at All     How often have you had to strain to start urination? Not at All     How many times did you typically get up at night to urinate? 1 Time     Total IPSS Score 1       Quality of Life due to urinary symptoms   If you were to spend the rest of your life with your urinary condition just the way it is now how would you feel about that? Delighted            No Known Allergies  Past Medical History:  Diagnosis Date  . Acute ST elevation myocardial infarction (STEMI) due to occlusion of mid portion of left anterior descending (LAD) coronary artery ([place]) 03/12/2019  . Acute systolic heart failure ([place])   . Arthritis   . CHF (congestive heart failure) ([place])   . CKD (chronic kidney disease) stage 4, GFR 15-29 ml/min (HCC)   . Dyspnea    with exertionm periodically  . Heart attack ([place]) 03/15/2019   anterior MI   . History of kidney stones   . Hyperparathyroidism ([place])   . Hypertension 2002  . Ischemic cardiomyopathy   . Left knee pain   . Syncope 04/04/2019   after blood draw  . Vitamin D deficiency     Past Surgical History:  Procedure Laterality Date  . AMPUTATION TOE Left 1991   2 digit of left foot  . CHOLECYSTECTOMY    . COLONOSCOPY    . CORONARY/GRAFT ACUTE MI REVASCULARIZATION N/A 03/12/2019   Procedure: Coronary/Graft Acute MI Revascularization;  Surgeon: Belva Crome, MD;  Location: [facility];  Service: Cardiovascular;  Laterality: N/A;  . ICD IMPLANT N/A 08/24/2019    Procedure: ICD IMPLANT;  Surgeon: Evans Lance, MD;  Location: [facility];  Service: Cardiovascular;  Laterality: N/A;  . IR URETERAL STENT LEFT NEW ACCESS W/O SEP NEPHROSTOMY CATH  09/26/2019  . LEFT HEART CATH AND CORONARY ANGIOGRAPHY N/A 03/12/2019   Procedure: LEFT HEART CATH AND CORONARY ANGIOGRAPHY;  Surgeon: Belva Crome, MD;  Location: [facility];  Service: Cardiovascular;  Laterality: N/A;  . LITHOTRIPSY Right   . NEPHROLITHOTOMY Left 09/26/2019   Procedure: LEFT NEPHROLITHOTOMY PERCUTANEOUS;  Surgeon: Irine Seal, MD;  Location: [facility];  Service: Urology;  Laterality: Left;    Social History   Socioeconomic History  . Marital status: Single    Spouse name: Not on file  . Number of children: 0  . Years of education: Not on file  . Highest education level: Some college, no degree  Occupational History  . Occupation: retired  Tobacco Use  . Smoking status: Former Smoker    Packs/day: 0.50    Years: 40.00    Pack years: 20.00    Types: Cigarettes    Quit date: 03/12/2019    Years since quitting: 0.6  . Smokeless tobacco: Never Used  Vaping Use  . Vaping Use: Never used  Substance and Sexual Activity  . Alcohol use: No  . Drug use: No  . Sexual activity: Not Currently  Other Topics Concern  . Not on file  Social History Narrative   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       Enjoys: fishing, shopping, working around the house       Diet: working on changes, avoiding fried foods, increased veggies   Caffeine: teas some daily   Water: 6-8 cups daily       Wears seat belt    Does not use phone while driving    Oceanographer at home    Social Determinants of Health   Financial Resource Strain: Low Risk   . Difficulty of Paying Living Expenses: Not hard at all  Food Insecurity: No Food Insecurity  . Worried About Charity fundraiser in the Last Year: Never true  . Ran Out of Food in the Last Year: Never true  Transportation Needs: No  Transportation Needs  . Lack of Transportation (Medical): No  . Lack of Transportation (Non-Medical): No  Physical Activity: Sufficiently Active  . Days of Exercise per Week: 3 days  . Minutes of Exercise per Session: 60 min  Stress: No Stress Concern Present  . Feeling of Stress : Not at all  Social Connections: Moderately Isolated  . Frequency of Communication with Friends and Family: More than three times a week  . Frequency of Social Gatherings with Friends and Family: More than three times a week  . Attends Religious Services: More than 4 times per year  . Active Member of Clubs or Organizations: No  . Attends Archivist Meetings: Never  . Marital Status: Never married  Intimate Partner Violence: Not At Risk  . Fear of Current or Ex-Partner: No  . Emotionally Abused: No  . Physically Abused: No  . Sexually Abused: No    Family History  Problem Relation Age of Onset  . Heart failure Mother   . Heart disease Mother   . Cancer Mother        breast  . Heart attack Father   . Hypertension Father   . Heart disease Father   . Diabetes Brother     Anti-infectives: Anti-infectives (From admission, onward)   None      Current Outpatient Medications  Medication Sig Dispense Refill  . allopurinol (ZYLOPRIM) 100 MG tablet Take 100 mg by mouth daily.     Marland Kitchen aspirin 81 MG chewable tablet Chew 1 tablet (81 mg total) by mouth daily. 90 tablet 1  . atorvastatin (LIPITOR) 80 MG tablet Take 1 tablet (80 mg total) by mouth daily at 6 PM. 90 tablet 1  . BIDIL 20-37.5 MG tablet Take 1 tablet by mouth twice daily 180 tablet 3  . carvedilol (COREG) 12.5 MG tablet TAKE 1 TABLET BY MOUTH TWICE DAILY WITH MEALS (Patient taking differently: Take 12.5 mg by mouth 2 (two) times daily with a meal. ) 180 tablet 3  . furosemide (LASIX) 40 MG tablet TAKE 1 TABLET BY MOUTH ONCE DAILY AS NEEDED FOR FLUID (Patient taking differently: Take 40 mg by mouth daily. ) 30 tablet 5  .  HYDROcodone-acetaminophen (NORCO/VICODIN) 5-325 MG tablet Take 1 tablet by mouth every 4 (four) hours as needed for moderate pain. 8 tablet 0  . ivabradine (CORLANOR) 5 MG TABS tablet Take 1 tablet (5 mg total) by mouth 2 (two) times daily with a meal. 60 tablet 11  . nitroGLYCERIN (NITROSTAT) 0.4 MG SL tablet Place 1 tablet (0.4 mg total) under the tongue every 5 (five) minutes as needed. 25 tablet 2  . sodium bicarbonate 650 MG tablet Take 650 mg by mouth 3 (three) times daily.    . ticagrelor (BRILINTA) 90 MG TABS tablet Take 1 tablet (90 mg total) by mouth 2 (two) times daily. 180 tablet 1   No current facility-administered medications for this visit.     Objective:  BP 135/88   Pulse 65   Temp (!) 96.8 F (36 C)   Ht 6' (1.829 m)   Wt 199 lb 9.6 oz (90.5 kg)   BMI 27.07 kg/m    Physical Exam Vitals reviewed.  Constitutional:      Appearance: Normal appearance.  Cardiovascular:     Rate and Rhythm: Normal rate and regular rhythm.     Heart sounds: Normal heart sounds.  Pulmonary:     Effort: Pulmonary effort is normal. No respiratory distress.     Breath sounds: Normal breath sounds.  Neurological:     Mental Status: He is alert.  Lab Results:  Cr was 2.98 on 6/4 and the calcium was 10.4.     BMET   Studies/Results:  Assessment/Plan: Bilateral nephrolithiasis with a partial staghorn on the left and moderate left upper pole hydronephrosis with atrophy of the upper pole.  He is doing well s/p left PCNL.  He has a residual  35mm LUP stone that was in a calyx with a stenosed infundibulum that I was unable to access and has a 19mm collection of stones in the RLP.  I will have him return in 6 months with a KUB. He has some residual pyuria and hematuria on the dip UA.  I will send a UA and culture.    He has some pain at the surgical site but one exam it appears to be secondary to a large skin tag that has some evidence of irritation. I will have him see Dermatology for  this.  I have refilled the hydrocodone with a few pills.    His Cr is down to 2.98 from a high of 3.48 at the time of surgery.   His calcium is 10.4 and his PTH level was 225.   Dr. Gaston Islam manages his renal insufficiency.        .    Meds ordered this encounter  Medications  . HYDROcodone-acetaminophen (NORCO/VICODIN) 5-325 MG tablet    Sig: Take 1 tablet by mouth every 4 (four) hours as needed for moderate pain.    Dispense:  8 tablet    Refill:  0     Orders Placed This Encounter  Procedures  . Urine Culture    Standing Status:   Future    Standing Expiration Date:   11/19/2019  . DG Abd 1 View    Standing Status:   Future    Standing Expiration Date:   10/19/2020    Order Specific Question:   Reason for Exam (SYMPTOM  OR DIAGNOSIS REQUIRED)    Answer:   nephrolithiasis    Order Specific Question:   Preferred imaging location?    Answer:   [facility]    Order Specific Question:   Radiology Contrast Protocol - do NOT remove file path    Answer:   [email or web address]  . Urinalysis, Routine w reflex microscopic    Standing Status:   Future    Standing Expiration Date:   11/19/2019  . Ambulatory referral to Dermatology    Referral Priority:   Routine    Referral Type:   Consultation    Referral Reason:   Specialty Services Required    Requested Specialty:   Dermatology    Number of Visits Requested:   1  . POCT urinalysis dipstick     Return in about 6 months (around 04/20/2020) for With KUB.    CC: Dr. Benny Lennert, Dr. Dicky Doe and Dr. Glori Bickers.      Irine Seal 10/20/2019 [phone]

## 2019-10-20 ENCOUNTER — Other Ambulatory Visit: Payer: Self-pay

## 2019-10-20 ENCOUNTER — Ambulatory Visit (INDEPENDENT_AMBULATORY_CARE_PROVIDER_SITE_OTHER): Payer: Medicare HMO | Admitting: Urology

## 2019-10-20 ENCOUNTER — Encounter: Payer: Self-pay | Admitting: Urology

## 2019-10-20 ENCOUNTER — Other Ambulatory Visit (HOSPITAL_COMMUNITY)
Admission: RE | Admit: 2019-10-20 | Discharge: 2019-10-20 | Disposition: A | Discharge status: Home / Self Care | Payer: Medicare HMO | Source: Other Acute Inpatient Hospital | Attending: Urology | Admitting: Urology

## 2019-10-20 VITALS — BP 135/88 | HR 65 | Temp 96.8°F | Ht 72.0 in | Wt 199.6 lb

## 2019-10-20 DIAGNOSIS — N132 Hydronephrosis with renal and ureteral calculous obstruction: Secondary | ICD-10-CM

## 2019-10-20 DIAGNOSIS — R3129 Other microscopic hematuria: Secondary | ICD-10-CM | POA: Insufficient documentation

## 2019-10-20 DIAGNOSIS — L918 Other hypertrophic disorders of the skin: Secondary | ICD-10-CM

## 2019-10-20 LAB — URINALYSIS, ROUTINE W REFLEX MICROSCOPIC
Bacteria, UA: NONE SEEN
Bilirubin Urine: NEGATIVE
Glucose, UA: NEGATIVE mg/dL
Ketones, ur: NEGATIVE mg/dL
Leukocytes,Ua: NEGATIVE
Nitrite: NEGATIVE
Protein, ur: NEGATIVE mg/dL
Specific Gravity, Urine: 1.01 (ref 1.005–1.030)
pH: 7 (ref 5.0–8.0)

## 2019-10-20 LAB — POCT URINALYSIS DIPSTICK
Bilirubin, UA: NEGATIVE
Glucose, UA: NEGATIVE
Ketones, UA: NEGATIVE
Nitrite, UA: NEGATIVE
Protein, UA: POSITIVE — AB
Spec Grav, UA: 1.01 (ref 1.010–1.025)
Urobilinogen, UA: 0.2 E.U./dL
pH, UA: 7.5 (ref 5.0–8.0)

## 2019-10-20 MED ORDER — HYDROCODONE-ACETAMINOPHEN 5-325 MG PO TABS: 1.0000 | ORAL_TABLET | ORAL | 0 refills | Status: DC | PRN

## 2019-10-20 NOTE — Progress Notes (Signed)
Urological Symptom Review  Patient is experiencing the following symptoms: Left side back pain   Review of Systems  Gastrointestinal (upper)  : Negative for upper GI symptoms  Gastrointestinal (lower) : Negative for lower GI symptoms  Constitutional : Negative for symptoms  Skin: Negative for skin symptoms  Eyes: Negative for eye symptoms  Ear/Nose/Throat : Negative for Ear/Nose/Throat symptoms  Hematologic/Lymphatic: Negative for Hematologic/Lymphatic symptoms  Cardiovascular : Negative for cardiovascular symptoms  Respiratory : Negative for respiratory symptoms  Endocrine: Negative for endocrine symptoms  Musculoskeletal: Back pain  Neurological: Negative for neurological symptoms  Psychologic: Negative for psychiatric symptoms

## 2019-10-21 LAB — URINE CULTURE

## 2019-10-23 ENCOUNTER — Telehealth: Payer: Self-pay

## 2019-10-23 NOTE — Telephone Encounter (Signed)
Pt called and made aware of ua/culture results

## 2019-11-01 NOTE — Progress Notes (Signed)
ADVANCED HF CLINIC PROGRESS NOTE  Primary Cardiologist: DR. Burt Knack Primary Care: Dr. Orson Ape AHF: Dr. Haroldine Laws   HPI: 64 y/o male, former smoker with HTN, CKD IV, CAD and systolic HF. Experienced a large anterolateral STEMI on 03/12/19. Has [employer] ICD.   Cath 03/12/2019  Total occlusion of the mid LAD treated with angioplasty and stenting using a 4.0 x 22 Onyx deployed at 14 atm. 0% stenosis was noted post procedure and TIMI grade III flow increased from 0 to 3. The final angiographic images of the LAD demonstrates apical occlusion due to embolus.  Luminal irregularities in the left main but no agreeable stenosis.  Large circumflex with 3 obtuse marginals. The first marginal is large and has a very distal 90% stenosis in 2 branches. There is a trifurcation distally in the first marginal.  RCA Moderate diffuse luminal irregularities but widely patent.  Mid anterior wall to apical akinesis. EF 30 to 35% acutely. LVEDP 31 mmHg.   Post cath had low BP and shock but recovered. EF 20% by echo. No significant MR. Creatinine post cath 3.1-3.4. Now followed by nephrology. Has been taking Lasix 40 mg MWF.    Had recent repeat echo 3/21. EF 20-25%. RV normal.  S/P [employer] ICD implant 08/2019   Admitted 09/26/19 with large left renal stone. S/P left nephrolithotomy. Discharged  09/30/19.  Today he returns for HF follow up.Overall feeling fine. Denies SOB/PND/Orthopnea. No chest pain. Not very acitve at home. Appetite ok. No fever or chills. Weight at home 203-205  pounds. Taking all medications.   Past Medical History:  Diagnosis Date  . Acute ST elevation myocardial infarction (STEMI) due to occlusion of mid portion of left anterior descending (LAD) coronary artery ([place]) 03/12/2019  . Acute systolic heart failure ([place])   . Arthritis   . CHF (congestive heart failure) ([place])   . CKD (chronic kidney disease) stage 4, GFR 15-29 ml/min (HCC)   . Dyspnea    with  exertionm periodically  . Heart attack ([place]) 03/15/2019   anterior MI   . History of kidney stones   . Hyperparathyroidism ([place])   . Hypertension 2002  . Ischemic cardiomyopathy   . Left knee pain   . Syncope 04/04/2019   after blood draw  . Vitamin D deficiency     Current Outpatient Medications  Medication Sig Dispense Refill  . allopurinol (ZYLOPRIM) 100 MG tablet Take 100 mg by mouth daily.     Marland Kitchen aspirin 81 MG chewable tablet Chew 1 tablet (81 mg total) by mouth daily. 90 tablet 1  . atorvastatin (LIPITOR) 80 MG tablet Take 1 tablet (80 mg total) by mouth daily at 6 PM. 90 tablet 1  . BIDIL 20-37.5 MG tablet Take 1 tablet by mouth twice daily 180 tablet 3  . carvedilol (COREG) 12.5 MG tablet TAKE 1 TABLET BY MOUTH TWICE DAILY WITH MEALS (Patient taking differently: Take 12.5 mg by mouth 2 (two) times daily with a meal. ) 180 tablet 3  . furosemide (LASIX) 40 MG tablet TAKE 1 TABLET BY MOUTH ONCE DAILY AS NEEDED FOR FLUID (Patient taking differently: Take 40 mg by mouth daily. ) 30 tablet 5  . HYDROcodone-acetaminophen (NORCO/VICODIN) 5-325 MG tablet Take 1 tablet by mouth every 4 (four) hours as needed for moderate pain. 8 tablet 0  . ivabradine (CORLANOR) 5 MG TABS tablet Take 1 tablet (5 mg total) by mouth 2 (two) times daily with a meal. 60 tablet 11  . nitroGLYCERIN (NITROSTAT)  0.4 MG SL tablet Place 1 tablet (0.4 mg total) under the tongue every 5 (five) minutes as needed. 25 tablet 2  . sodium bicarbonate 650 MG tablet Take 650 mg by mouth 3 (three) times daily.    . ticagrelor (BRILINTA) 90 MG TABS tablet Take 1 tablet (90 mg total) by mouth 2 (two) times daily. 180 tablet 1   No current facility-administered medications for this encounter.    No Known Allergies    Social History   Socioeconomic History  . Marital status: Single    Spouse name: Not on file  . Number of children: 0  . Years of education: Not on file  . Highest education level: Some college, no degree   Occupational History  . Occupation: retired  Tobacco Use  . Smoking status: Former Smoker    Packs/day: 0.50    Years: 40.00    Pack years: 20.00    Types: Cigarettes    Quit date: 03/12/2019    Years since quitting: 0.6  . Smokeless tobacco: Never Used  Vaping Use  . Vaping Use: Never used  Substance and Sexual Activity  . Alcohol use: No  . Drug use: No  . Sexual activity: Not Currently  Other Topics Concern  . Not on file  Social History Narrative   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       Enjoys: fishing, shopping, working around the house       Diet: working on changes, avoiding fried foods, increased veggies   Caffeine: teas some daily   Water: 6-8 cups daily       Wears seat belt    Does not use phone while driving    Oceanographer at home    Social Determinants of Health   Financial Resource Strain: Low Risk   . Difficulty of Paying Living Expenses: Not hard at all  Food Insecurity: No Food Insecurity  . Worried About Charity fundraiser in the Last Year: Never true  . Ran Out of Food in the Last Year: Never true  Transportation Needs: No Transportation Needs  . Lack of Transportation (Medical): No  . Lack of Transportation (Non-Medical): No  Physical Activity: Sufficiently Active  . Days of Exercise per Week: 3 days  . Minutes of Exercise per Session: 60 min  Stress: No Stress Concern Present  . Feeling of Stress : Not at all  Social Connections: Moderately Isolated  . Frequency of Communication with Friends and Family: More than three times a week  . Frequency of Social Gatherings with Friends and Family: More than three times a week  . Attends Religious Services: More than 4 times per year  . Active Member of Clubs or Organizations: No  . Attends Archivist Meetings: Never  . Marital Status: Never married  Intimate Partner Violence: Not At Risk  . Fear of Current or Ex-Partner: No  . Emotionally Abused: No  . Physically Abused: No    . Sexually Abused: No      Family History  Problem Relation Age of Onset  . Heart failure Mother   . Heart disease Mother   . Cancer Mother        breast  . Heart attack Father   . Hypertension Father   . Heart disease Father   . Diabetes Brother     Vitals:   11/02/19 0935  BP: 124/78  Pulse: 78  SpO2: 99%  Weight: 93.3 kg (205 lb 9.6 oz)  Height: 6' (1.829 m)    Wt Readings from Last 3 Encounters:  11/02/19 93.3 kg (205 lb 9.6 oz)  10/20/19 90.5 kg (199 lb 9.6 oz)  09/27/19 93 kg (205 lb 0.4 oz)     PHYSICAL EXAM: General:  Well appearing. No resp difficulty HEENT: normal Neck: supple. no JVD. Carotids 2+ bilat; no bruits. No lymphadenopathy or thryomegaly appreciated. Cor: PMI nondisplaced. Regular rate & rhythm. No rubs, gallops or murmurs. Scar left upper chest.  Lungs: clear Abdomen: soft, nontender, nondistended. No hepatosplenomegaly. No bruits or masses. Good bowel sounds. Extremities: no cyanosis, clubbing, rash, edema Neuro: alert & orientedx3, cranial nerves grossly intact. moves all 4 extremities w/o difficulty. Affect pleasant    ASSESSMENT & PLAN:  1. CAD s/p acute anterolateral STEMI 03/12/19 - s/p DES LAD - Continue DAPT + statin   2. Chronic Sstolic HF - EF 80%. Following anteriorlateral stemi - Repeat Echo 3/21: EF 20-25%. RV ok.  - Has [employer] ICD.  - NYHA II. Volume status stable. Continue lasix 40 mg daily  - Continue Bidil 1 tab TID  - Continue carvedilol 12.5 bid.  - Continue ivabradine 5 mg twice a day.  - Not candidate for Entresto, SGLT2i or spiro with CKD IV.  3. 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 Hinda Lenis - Request lab results from [facility].   4. HTN - Stable.    5. Tobacco use - congratulated on cessation  Follow up 3 months with Dr Haroldine Laws.     Darrick Grinder, NP  9:43 AM

## 2019-11-02 ENCOUNTER — Ambulatory Visit (HOSPITAL_COMMUNITY)
Admission: RE | Admit: 2019-11-02 | Discharge: 2019-11-02 | Disposition: A | Discharge status: Home / Self Care | Payer: Medicare HMO | Source: Ambulatory Visit | Attending: Adult Health | Admitting: Adult Health

## 2019-11-02 ENCOUNTER — Other Ambulatory Visit: Payer: Self-pay

## 2019-11-02 ENCOUNTER — Encounter (HOSPITAL_COMMUNITY): Payer: Self-pay

## 2019-11-02 VITALS — BP 124/78 | HR 78 | Ht 72.0 in | Wt 205.6 lb

## 2019-11-02 DIAGNOSIS — Z7982 Long term (current) use of aspirin: Secondary | ICD-10-CM | POA: Insufficient documentation

## 2019-11-02 DIAGNOSIS — I2510 Atherosclerotic heart disease of native coronary artery without angina pectoris: Secondary | ICD-10-CM | POA: Diagnosis not present

## 2019-11-02 DIAGNOSIS — I130 Hypertensive heart and chronic kidney disease with heart failure and stage 1 through stage 4 chronic kidney disease, or unspecified chronic kidney disease: Secondary | ICD-10-CM | POA: Insufficient documentation

## 2019-11-02 DIAGNOSIS — I252 Old myocardial infarction: Secondary | ICD-10-CM | POA: Diagnosis not present

## 2019-11-02 DIAGNOSIS — Z87891 Personal history of nicotine dependence: Secondary | ICD-10-CM | POA: Insufficient documentation

## 2019-11-02 DIAGNOSIS — Z8249 Family history of ischemic heart disease and other diseases of the circulatory system: Secondary | ICD-10-CM | POA: Diagnosis not present

## 2019-11-02 DIAGNOSIS — N184 Chronic kidney disease, stage 4 (severe): Secondary | ICD-10-CM | POA: Diagnosis not present

## 2019-11-02 DIAGNOSIS — Z9861 Coronary angioplasty status: Secondary | ICD-10-CM | POA: Diagnosis not present

## 2019-11-02 DIAGNOSIS — Z955 Presence of coronary angioplasty implant and graft: Secondary | ICD-10-CM | POA: Diagnosis not present

## 2019-11-02 DIAGNOSIS — I255 Ischemic cardiomyopathy: Secondary | ICD-10-CM

## 2019-11-02 DIAGNOSIS — Z79899 Other long term (current) drug therapy: Secondary | ICD-10-CM | POA: Diagnosis not present

## 2019-11-02 DIAGNOSIS — I5022 Chronic systolic (congestive) heart failure: Secondary | ICD-10-CM | POA: Insufficient documentation

## 2019-11-02 DIAGNOSIS — Z87442 Personal history of urinary calculi: Secondary | ICD-10-CM | POA: Insufficient documentation

## 2019-11-02 DIAGNOSIS — M1990 Unspecified osteoarthritis, unspecified site: Secondary | ICD-10-CM | POA: Diagnosis not present

## 2019-11-02 NOTE — Patient Instructions (Signed)
Follow up in 3 months with Dr.Bensimhon

## 2019-11-10 ENCOUNTER — Encounter (HOSPITAL_COMMUNITY): Payer: Self-pay | Admitting: Urology

## 2019-11-13 ENCOUNTER — Ambulatory Visit (INDEPENDENT_AMBULATORY_CARE_PROVIDER_SITE_OTHER): Payer: Medicare HMO | Admitting: Orthopedic Surgery

## 2019-11-13 ENCOUNTER — Other Ambulatory Visit: Payer: Self-pay

## 2019-11-13 ENCOUNTER — Encounter: Payer: Self-pay | Admitting: Orthopedic Surgery

## 2019-11-13 VITALS — BP 108/75 | HR 78 | Ht 72.0 in | Wt 208.4 lb

## 2019-11-13 DIAGNOSIS — M1712 Unilateral primary osteoarthritis, left knee: Secondary | ICD-10-CM

## 2019-11-13 DIAGNOSIS — M1710 Unilateral primary osteoarthritis, unspecified knee: Secondary | ICD-10-CM

## 2019-11-13 NOTE — Patient Instructions (Signed)
Referral to Dr. Ninfa Linden for possible left total knee

## 2019-11-13 NOTE — Progress Notes (Signed)
Chief Complaint  Patient presents with  . Knee Pain    L/hurting some today/here to discuss surgery    64 year old male with several medical problems including coronary artery disease, and 2020 November he 2 stents placed had a recent echo where he only had 20% ejection fraction is got chronic stage IV kidney disease  He did stop smoking last year presents with continued disabling left knee pain interested in left total knee  Past Medical History:  Diagnosis Date  . Acute ST elevation myocardial infarction (STEMI) due to occlusion of mid portion of left anterior descending (LAD) coronary artery ([place]) 03/12/2019  . Acute systolic heart failure ([place])   . Arthritis   . CHF (congestive heart failure) ([place])   . CKD (chronic kidney disease) stage 4, GFR 15-29 ml/min (HCC)   . Dyspnea    with exertionm periodically  . Heart attack ([place]) 03/15/2019   anterior MI   . History of kidney stones   . Hyperparathyroidism ([place])   . Hypertension 2002  . Ischemic cardiomyopathy   . Left knee pain   . Syncope 04/04/2019   after blood draw  . Vitamin D deficiency    BP 108/75   Pulse 78   Ht 6' (1.829 m)   Wt 208 lb 6 oz (94.5 kg)   BMI 28.26 kg/m   After long discussion with Dr. Philipp Ovens regarding his medical problems we discussed the possibility of him being evaluated by [facility] to see if he is a candidate for surgery for to a total knee replacement  As I told him he is at a high risk for complications postoperatively and should not have his surgery at a community hospital   Time spent reviewing old records , cardiology notes , urology notes and xrays and discusssion 20 min

## 2019-11-24 ENCOUNTER — Ambulatory Visit (INDEPENDENT_AMBULATORY_CARE_PROVIDER_SITE_OTHER): Payer: Medicare HMO | Admitting: *Deleted

## 2019-11-24 DIAGNOSIS — I255 Ischemic cardiomyopathy: Secondary | ICD-10-CM | POA: Diagnosis not present

## 2019-11-24 LAB — CUP PACEART REMOTE DEVICE CHECK
Battery Remaining Longevity: 180 mo
Battery Remaining Percentage: 100 %
Brady Statistic RV Percent Paced: 0 %
Date Time Interrogation Session: 20210723072900
HighPow Impedance: 63 Ohm
Implantable Lead Implant Date: 20210422
Implantable Lead Location: 753860
Implantable Lead Model: 138
Implantable Lead Serial Number: 302700
Implantable Pulse Generator Implant Date: 20210422
Lead Channel Impedance Value: 439 Ohm
Lead Channel Setting Pacing Amplitude: 3.5 V
Lead Channel Setting Pacing Pulse Width: 0.4 ms
Lead Channel Setting Sensing Sensitivity: 0.5 mV
Pulse Gen Serial Number: 209495

## 2019-11-27 NOTE — Progress Notes (Signed)
Remote ICD transmission.   

## 2019-12-01 ENCOUNTER — Other Ambulatory Visit: Payer: Self-pay

## 2019-12-01 ENCOUNTER — Encounter: Payer: Self-pay | Admitting: Internal Medicine

## 2019-12-01 ENCOUNTER — Ambulatory Visit (INDEPENDENT_AMBULATORY_CARE_PROVIDER_SITE_OTHER): Payer: Medicaid Other | Admitting: Internal Medicine

## 2019-12-01 DIAGNOSIS — I255 Ischemic cardiomyopathy: Secondary | ICD-10-CM

## 2019-12-01 DIAGNOSIS — I5041 Acute combined systolic (congestive) and diastolic (congestive) heart failure: Secondary | ICD-10-CM

## 2019-12-01 NOTE — Progress Notes (Signed)
HPI [name] returns for ongoing ICD followup. He is a pleasant 64 yo man with chronic systolic heart failure, an ICM and renal insufficiency. He is s/p PCI. He underwent ICD Insertion just over 3 months ago. He has done well in the interim. No residual chest pain. He has class 2 dyspnea. d No Known Allergies   Current Outpatient Medications  Medication Sig Dispense Refill  . allopurinol (ZYLOPRIM) 100 MG tablet Take 100 mg by mouth daily.     Marland Kitchen aspirin 81 MG chewable tablet Chew 1 tablet (81 mg total) by mouth daily. 90 tablet 1  . atorvastatin (LIPITOR) 80 MG tablet Take 1 tablet (80 mg total) by mouth daily at 6 PM. 90 tablet 1  . BIDIL 20-37.5 MG tablet Take 1 tablet by mouth twice daily 180 tablet 3  . carvedilol (COREG) 12.5 MG tablet TAKE 1 TABLET BY MOUTH TWICE DAILY WITH MEALS 180 tablet 3  . furosemide (LASIX) 40 MG tablet TAKE 1 TABLET BY MOUTH ONCE DAILY AS NEEDED FOR FLUID 30 tablet 5  . HYDROcodone-acetaminophen (NORCO/VICODIN) 5-325 MG tablet Take 1 tablet by mouth every 4 (four) hours as needed for moderate pain. 8 tablet 0  . ivabradine (CORLANOR) 5 MG TABS tablet Take 1 tablet (5 mg total) by mouth 2 (two) times daily with a meal. 60 tablet 11  . nitroGLYCERIN (NITROSTAT) 0.4 MG SL tablet Place 1 tablet (0.4 mg total) under the tongue every 5 (five) minutes as needed. 25 tablet 2  . potassium citrate (UROCIT-K) 10 MEQ (1080 MG) SR tablet Take 1 tablet by mouth daily.    . sodium bicarbonate 650 MG tablet Take 650 mg by mouth 3 (three) times daily.    . ticagrelor (BRILINTA) 90 MG TABS tablet Take 1 tablet (90 mg total) by mouth 2 (two) times daily. 180 tablet 1   No current facility-administered medications for this visit.     Past Medical History:  Diagnosis Date  . Acute ST elevation myocardial infarction (STEMI) due to occlusion of mid portion of left anterior descending (LAD) coronary artery ([place]) 03/12/2019  . Acute systolic heart failure ([place])   .  Arthritis   . CHF (congestive heart failure) ([place])   . CKD (chronic kidney disease) stage 4, GFR 15-29 ml/min ([facility])   . Dyspnea    with exertionm periodically  . Heart attack ([place]) 03/15/2019   anterior MI   . History of kidney stones   . Hyperparathyroidism ([place])   . Hypertension 2002  . Ischemic cardiomyopathy   . Left knee pain   . Syncope 04/04/2019   after blood draw  . Vitamin D deficiency     ROS:   All systems reviewed and negative except as noted in the HPI.   Past Surgical History:  Procedure Laterality Date  . AMPUTATION TOE Left 1991   2 digit of left foot  . CHOLECYSTECTOMY    . COLONOSCOPY    . CORONARY/GRAFT ACUTE MI REVASCULARIZATION N/A 03/12/2019   Procedure: Coronary/Graft Acute MI Revascularization;  Surgeon: Belva Crome, MD;  Location: [facility];  Service: Cardiovascular;  Laterality: N/A;  . ICD IMPLANT N/A 08/24/2019   Procedure: ICD IMPLANT;  Surgeon: Evans Lance, MD;  Location: [facility];  Service: Cardiovascular;  Laterality: N/A;  . IR URETERAL STENT LEFT NEW ACCESS W/O SEP NEPHROSTOMY CATH  09/26/2019  . LEFT HEART CATH AND CORONARY ANGIOGRAPHY N/A 03/12/2019   Procedure: LEFT HEART CATH  AND CORONARY ANGIOGRAPHY;  Surgeon: Belva Crome, MD;  Location: [facility];  Service: Cardiovascular;  Laterality: N/A;  . LITHOTRIPSY Right   . NEPHROLITHOTOMY Left 09/26/2019   Procedure: LEFT NEPHROLITHOTOMY PERCUTANEOUS;  Surgeon: Irine Seal, MD;  Location: [facility];  Service: Urology;  Laterality: Left;     Family History  Problem Relation Age of Onset  . Heart failure Mother   . Heart disease Mother   . Cancer Mother        breast  . Heart attack Father   . Hypertension Father   . Heart disease Father   . Diabetes Brother      Social History   Socioeconomic History  . Marital status: Single    Spouse name: Not on file  . Number of children: 0  . Years of education: Not on file  . Highest education level: Some  college, no degree  Occupational History  . Occupation: retired  Tobacco Use  . Smoking status: Former Smoker    Packs/day: 0.50    Years: 40.00    Pack years: 20.00    Types: Cigarettes    Quit date: 03/12/2019    Years since quitting: 0.7  . Smokeless tobacco: Never Used  Vaping Use  . Vaping Use: Never used  Substance and Sexual Activity  . Alcohol use: No  . Drug use: No  . Sexual activity: Not Currently  Other Topics Concern  . Not on file  Social History Narrative   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       Enjoys: fishing, shopping, working around the house       Diet: working on changes, avoiding fried foods, increased veggies   Caffeine: teas some daily   Water: 6-8 cups daily       Wears seat belt    Does not use phone while driving    Oceanographer at home    Social Determinants of Health   Financial Resource Strain: Low Risk   . Difficulty of Paying Living Expenses: Not hard at all  Food Insecurity: No Food Insecurity  . Worried About Charity fundraiser in the Last Year: Never true  . Ran Out of Food in the Last Year: Never true  Transportation Needs: No Transportation Needs  . Lack of Transportation (Medical): No  . Lack of Transportation (Non-Medical): No  Physical Activity: Sufficiently Active  . Days of Exercise per Week: 3 days  . Minutes of Exercise per Session: 60 min  Stress: No Stress Concern Present  . Feeling of Stress : Not at all  Social Connections: Moderately Isolated  . Frequency of Communication with Friends and Family: More than three times a week  . Frequency of Social Gatherings with Friends and Family: More than three times a week  . Attends Religious Services: More than 4 times per year  . Active Member of Clubs or Organizations: No  . Attends Archivist Meetings: Never  . Marital Status: Never married  Intimate Partner Violence: Not At Risk  . Fear of Current or Ex-Partner: No  . Emotionally Abused: No  .  Physically Abused: No  . Sexually Abused: No     BP 112/80   Pulse 70   Ht 6' (1.829 m)   Wt (!) 206 lb (93.4 kg)   SpO2 98%   BMI 27.94 kg/m   Physical Exam:  Well appearing NAD HEENT: Unremarkable Neck:  No JVD, no thyromegally Lymphatics:  No adenopathy  Back:  No CVA tenderness Lungs:  Clear HEART:  Regular rate rhythm, no murmurs, no rubs, no clicks Abd:  soft, positive bowel sounds, no organomegally, no rebound, no guarding Ext:  2 plus pulses, no edema, no cyanosis, no clubbing Skin:  No rashes no nodules Neuro:  CN II through XII intact, motor grossly intact  DEVICE  Normal device function.  See PaceArt for details.   Assess/Plan: 1. Chronic systolic heart failure - he has class 2 symptoms and we will continue his current meds. He remains with class 2 symptoms. Continue maximal medical therapy 2. CAD, s/p anterior MI - he denies anginal symptoms. No change in meds. 3. HTN - his bp is well controlled. NO change in meds fo rnow 4. ICD - his [employer] single chamber ICD is working normally. He fluid status is stable with review of heart logic. He will continue his current treatment.   Mikle Bosworth.D.

## 2019-12-01 NOTE — Patient Instructions (Signed)
Medication Instructions:  Your physician recommends that you continue on your current medications as directed. Please refer to the Current Medication list given to you today.  *If you need a refill on your cardiac medications before your next appointment, please call your pharmacy*   Lab Work: NONE   If you have labs (blood work) drawn today and your tests are completely normal, you will receive your results only by: . MyChart Message (if you have MyChart) OR . A paper copy in the mail If you have any lab test that is abnormal or we need to change your treatment, we will call you to review the results.   Testing/Procedures: NONE    Follow-Up: At [facility], you and your health needs are our priority.  As part of our continuing mission to provide you with exceptional heart care, we have created designated Provider Care Teams.  These Care Teams include your primary Cardiologist (physician) and Advanced Practice Providers (APPs -  Physician Assistants and Nurse Practitioners) who all work together to provide you with the care you need, when you need it.  We recommend signing up for the patient portal called "MyChart".  Sign up information is provided on this After Visit Summary.  MyChart is used to connect with patients for Virtual Visits (Telemedicine).  Patients are able to view lab/test results, encounter notes, upcoming appointments, etc.  Non-urgent messages can be sent to your provider as well.   To learn more about what you can do with MyChart, go to [email or web address].    Your next appointment:   1 year(s)  The format for your next appointment:   In Person  Provider:   Gregg Taylor, MD   Other Instructions Thank you for choosing [facility]!    

## 2019-12-27 ENCOUNTER — Ambulatory Visit (INDEPENDENT_AMBULATORY_CARE_PROVIDER_SITE_OTHER): Payer: Medicare HMO | Admitting: Family Medicine

## 2019-12-27 ENCOUNTER — Encounter: Payer: Self-pay | Admitting: Family Medicine

## 2019-12-27 ENCOUNTER — Other Ambulatory Visit: Payer: Self-pay

## 2019-12-27 VITALS — BP 108/78 | HR 66 | Temp 97.2°F | Resp 16 | Ht 72.0 in | Wt 209.4 lb

## 2019-12-27 DIAGNOSIS — N184 Chronic kidney disease, stage 4 (severe): Secondary | ICD-10-CM | POA: Diagnosis not present

## 2019-12-27 DIAGNOSIS — Z0000 Encounter for general adult medical examination without abnormal findings: Secondary | ICD-10-CM

## 2019-12-27 DIAGNOSIS — Z23 Encounter for immunization: Secondary | ICD-10-CM

## 2019-12-27 DIAGNOSIS — Z1211 Encounter for screening for malignant neoplasm of colon: Secondary | ICD-10-CM | POA: Diagnosis not present

## 2019-12-27 DIAGNOSIS — E785 Hyperlipidemia, unspecified: Secondary | ICD-10-CM | POA: Diagnosis not present

## 2019-12-27 DIAGNOSIS — I10 Essential (primary) hypertension: Secondary | ICD-10-CM

## 2019-12-27 LAB — POCT URINALYSIS DIP (CLINITEK)
Bilirubin, UA: NEGATIVE
Glucose, UA: NEGATIVE mg/dL
Ketones, POC UA: NEGATIVE mg/dL
Leukocytes, UA: NEGATIVE
Nitrite, UA: NEGATIVE
POC PROTEIN,UA: NEGATIVE
Spec Grav, UA: 1.02 (ref 1.010–1.025)
Urobilinogen, UA: 0.2 E.U./dL
pH, UA: 7 (ref 5.0–8.0)

## 2019-12-27 NOTE — Patient Instructions (Addendum)
[name] , Thank you for taking time to come for your Medicare Wellness Visit. I appreciate your ongoing commitment to your health goals. Please review the following plan we discussed and let me know if I can assist you in the future.   Please continue to practice social distancing to keep you, your family, and our community safe.  If you must go out, please wear a Mask and practice good handwashing.  Labs: Updated fasting labs ordered today- please get in the next week.  Screening recommendations/referrals: Colonoscopy: Cologuard ordered today Recommended yearly ophthalmology/optometry visit for glaucoma screening and checkup Recommended yearly dental visit for hygiene and checkup  Vaccinations: Influenza vaccine: Received Pneumococcal vaccine: Recommended Tdap vaccine: Needed Shingles vaccine: Recommended  Advanced directives: Paperwork provided today  Conditions/risks identified: hypertension  Next appointment: 6 months for Annual visit with labs- fasting; 1 year for annual wellness visit  Preventive Care 40-64 Years, Male Preventive care refers to lifestyle choices and visits with your health care provider that can promote health and wellness. What does preventive care include?  A yearly physical exam. This is also called an annual well check.  Dental exams once or twice a year.  Routine eye exams. Ask your health care provider how often you should have your eyes checked.  Personal lifestyle choices, including:  Daily care of your teeth and gums.  Regular physical activity.  Eating a healthy diet.  Avoiding tobacco and drug use.  Limiting alcohol use.  Practicing safe sex.  Taking low-dose aspirin every day starting at age 2. What happens during an annual well check? The services and screenings done by your health care provider during your annual well check will depend on your age, overall health, lifestyle risk factors, and family history of  disease. Counseling  Your health care provider may ask you questions about your:  Alcohol use.  Tobacco use.  Drug use.  Emotional well-being.  Home and relationship well-being.  Sexual activity.  Eating habits.  Work and work Statistician. Screening  You may have the following tests or measurements:  Height, weight, and BMI.  Blood pressure.  Lipid and cholesterol levels. These may be checked every 5 years, or more frequently if you are over 73 years old.  Skin check.  Lung cancer screening. You may have this screening every year starting at age 108 if you have a 30-pack-year history of smoking and currently smoke or have quit within the past 15 years.  Fecal occult blood test (FOBT) of the stool. You may have this test every year starting at age 66.  Flexible sigmoidoscopy or colonoscopy. You may have a sigmoidoscopy every 5 years or a colonoscopy every 10 years starting at age 66.  Prostate cancer screening. Recommendations will vary depending on your family history and other risks.  Hepatitis C blood test.  Hepatitis B blood test.  Sexually transmitted disease (STD) testing.  Diabetes screening. This is done by checking your blood sugar (glucose) after you have not eaten for a while (fasting). You may have this done every 1-3 years. Discuss your test results, treatment options, and if necessary, the need for more tests with your health care provider. Vaccines  Your health care provider may recommend certain vaccines, such as:  Influenza vaccine. This is recommended every year.  Tetanus, diphtheria, and acellular pertussis (Tdap, Td) vaccine. You may need a Td booster every 10 years.  Zoster vaccine. You may need this after age 68.  Pneumococcal 13-valent conjugate (PCV13) vaccine. You may need  this if you have certain conditions and have not been vaccinated.  Pneumococcal polysaccharide (PPSV23) vaccine. You may need one or two doses if you smoke cigarettes  or if you have certain conditions. Talk to your health care provider about which screenings and vaccines you need and how often you need them. This information is not intended to replace advice given to you by your health care provider. Make sure you discuss any questions you have with your health care provider. Document Released: 05/17/2015 Document Revised: 01/08/2016 Document Reviewed: 02/19/2015 Elsevier Interactive Patient Education  2017 [place] Prevention in the Home Falls can cause injuries. They can happen to people of all ages. There are many things you can do to make your home safe and to help prevent falls. What can I do on the outside of my home?  Regularly fix the edges of walkways and driveways and fix any cracks.  Remove anything that might make you trip as you walk through a door, such as a raised step or threshold.  Trim any bushes or trees on the path to your home.  Use bright outdoor lighting.  Clear any walking paths of anything that might make someone trip, such as rocks or tools.  Regularly check to see if handrails are loose or broken. Make sure that both sides of any steps have handrails.  Any raised decks and porches should have guardrails on the edges.  Have any leaves, snow, or ice cleared regularly.  Use sand or salt on walking paths during winter.  Clean up any spills in your garage right away. This includes oil or grease spills. What can I do in the bathroom?  Use night lights.  Install grab bars by the toilet and in the tub and shower. Do not use towel bars as grab bars.  Use non-skid mats or decals in the tub or shower.  If you need to sit down in the shower, use a plastic, non-slip stool.  Keep the floor dry. Clean up any water that spills on the floor as soon as it happens.  Remove soap buildup in the tub or shower regularly.  Attach bath mats securely with double-sided non-slip rug tape.  Do not have throw rugs and other  things on the floor that can make you trip. What can I do in the bedroom?  Use night lights.  Make sure that you have a light by your bed that is easy to reach.  Do not use any sheets or blankets that are too big for your bed. They should not hang down onto the floor.  Have a firm chair that has side arms. You can use this for support while you get dressed.  Do not have throw rugs and other things on the floor that can make you trip. What can I do in the kitchen?  Clean up any spills right away.  Avoid walking on wet floors.  Keep items that you use a lot in easy-to-reach places.  If you need to reach something above you, use a strong step stool that has a grab bar.  Keep electrical cords out of the way.  Do not use floor polish or wax that makes floors slippery. If you must use wax, use non-skid floor wax.  Do not have throw rugs and other things on the floor that can make you trip. What can I do with my stairs?  Do not leave any items on the stairs.  Make sure that there are handrails  on both sides of the stairs and use them. Fix handrails that are broken or loose. Make sure that handrails are as long as the stairways.  Check any carpeting to make sure that it is firmly attached to the stairs. Fix any carpet that is loose or worn.  Avoid having throw rugs at the top or bottom of the stairs. If you do have throw rugs, attach them to the floor with carpet tape.  Make sure that you have a light switch at the top of the stairs and the bottom of the stairs. If you do not have them, ask someone to add them for you. What else can I do to help prevent falls?  Wear shoes that:  Do not have high heels.  Have rubber bottoms.  Are comfortable and fit you well.  Are closed at the toe. Do not wear sandals.  If you use a stepladder:  Make sure that it is fully opened. Do not climb a closed stepladder.  Make sure that both sides of the stepladder are locked into place.  Ask  someone to hold it for you, if possible.  Clearly mark and make sure that you can see:  Any grab bars or handrails.  First and last steps.  Where the edge of each step is.  Use tools that help you move around (mobility aids) if they are needed. These include:  Canes.  Walkers.  Scooters.  Crutches.  Turn on the lights when you go into a dark area. Replace any light bulbs as soon as they burn out.  Set up your furniture so you have a clear path. Avoid moving your furniture around.  If any of your floors are uneven, fix them.  If there are any pets around you, be aware of where they are.  Review your medicines with your doctor. Some medicines can make you feel dizzy. This can increase your chance of falling. Ask your doctor what other things that you can do to help prevent falls. This information is not intended to replace advice given to you by your health care provider. Make sure you discuss any questions you have with your health care provider. Document Released: 02/14/2009 Document Revised: 09/26/2015 Document Reviewed: 05/25/2014 Elsevier Interactive Patient Education  2017 [employer].

## 2019-12-27 NOTE — Progress Notes (Signed)
Subjective:   [name] is a 64 y.o. male who presents for a Welcome to Medicare exam.   In office visit   Review of Systems:Yes  Cardiac Risk Factors include: advanced age (>66men, >51 women);diabetes mellitus;dyslipidemia;male gender;hypertension     Objective:    Today's Vitals   12/27/19 0905 12/27/19 0906 12/27/19 0907  BP: 108/78 108/78   Pulse: 66 66   Resp: 16    Temp: (!) 97.2 F (36.2 C)    TempSrc: Temporal    SpO2: 98% 98%   Weight: 209 lb 6.4 oz (95 kg)    Height: 6' (1.829 m)    PainSc: 0-No pain  0-No pain   Body mass index is 28.4 kg/m.  Medications Outpatient Encounter Medications as of 12/27/2019  Medication Sig  . allopurinol (ZYLOPRIM) 100 MG tablet Take 100 mg by mouth daily.   Marland Kitchen aspirin 81 MG chewable tablet Chew 1 tablet (81 mg total) by mouth daily.  Marland Kitchen atorvastatin (LIPITOR) 80 MG tablet Take 1 tablet (80 mg total) by mouth daily at 6 PM.  . BIDIL 20-37.5 MG tablet Take 1 tablet by mouth twice daily  . carvedilol (COREG) 12.5 MG tablet TAKE 1 TABLET BY MOUTH TWICE DAILY WITH MEALS  . furosemide (LASIX) 40 MG tablet TAKE 1 TABLET BY MOUTH ONCE DAILY AS NEEDED FOR FLUID  . ivabradine (CORLANOR) 5 MG TABS tablet Take 1 tablet (5 mg total) by mouth 2 (two) times daily with a meal.  . nitroGLYCERIN (NITROSTAT) 0.4 MG SL tablet Place 1 tablet (0.4 mg total) under the tongue every 5 (five) minutes as needed.  . potassium citrate (UROCIT-K) 10 MEQ (1080 MG) SR tablet Take 1 tablet by mouth daily.  . sodium bicarbonate 650 MG tablet Take 650 mg by mouth 3 (three) times daily.  . ticagrelor (BRILINTA) 90 MG TABS tablet Take 1 tablet (90 mg total) by mouth 2 (two) times daily.  Marland Kitchen HYDROcodone-acetaminophen (NORCO/VICODIN) 5-325 MG tablet Take 1 tablet by mouth every 4 (four) hours as needed for moderate pain. (Patient not taking: Reported on 12/27/2019)   No facility-administered encounter medications on file as of 12/27/2019.     History: Past  Medical History:  Diagnosis Date  . Acute ST elevation myocardial infarction (STEMI) due to occlusion of mid portion of left anterior descending (LAD) coronary artery ([place]) 03/12/2019  . Acute systolic heart failure ([place])   . Arthritis   . CHF (congestive heart failure) ([place])   . CKD (chronic kidney disease) stage 4, GFR 15-29 ml/min (HCC)   . Dyspnea    with exertionm periodically  . Heart attack ([place]) 03/15/2019   anterior MI   . History of kidney stones   . Hyperparathyroidism ([place])   . Hypertension 2002  . Ischemic cardiomyopathy   . Left knee pain   . Syncope 04/04/2019   after blood draw  . Vitamin D deficiency    Past Surgical History:  Procedure Laterality Date  . AMPUTATION TOE Left 1991   2 digit of left foot  . CHOLECYSTECTOMY    . COLONOSCOPY    . CORONARY/GRAFT ACUTE MI REVASCULARIZATION N/A 03/12/2019   Procedure: Coronary/Graft Acute MI Revascularization;  Surgeon: Belva Crome, MD;  Location: [facility];  Service: Cardiovascular;  Laterality: N/A;  . ICD IMPLANT N/A 08/24/2019   Procedure: ICD IMPLANT;  Surgeon: Evans Lance, MD;  Location: [facility];  Service: Cardiovascular;  Laterality: N/A;  . IR URETERAL STENT LEFT  NEW ACCESS W/O SEP NEPHROSTOMY CATH  09/26/2019  . LEFT HEART CATH AND CORONARY ANGIOGRAPHY N/A 03/12/2019   Procedure: LEFT HEART CATH AND CORONARY ANGIOGRAPHY;  Surgeon: Belva Crome, MD;  Location: [facility];  Service: Cardiovascular;  Laterality: N/A;  . LITHOTRIPSY Right   . NEPHROLITHOTOMY Left 09/26/2019   Procedure: LEFT NEPHROLITHOTOMY PERCUTANEOUS;  Surgeon: Irine Seal, MD;  Location: [facility];  Service: Urology;  Laterality: Left;    Family History  Problem Relation Age of Onset  . Heart failure Mother   . Heart disease Mother   . Cancer Mother        breast  . Heart attack Father   . Hypertension Father   . Heart disease Father   . Diabetes Brother    Social History   Occupational History  .  Occupation: retired  Tobacco Use  . Smoking status: Former Smoker    Packs/day: 0.50    Years: 40.00    Pack years: 20.00    Types: Cigarettes    Quit date: 03/12/2019    Years since quitting: 0.7  . Smokeless tobacco: Never Used  Vaping Use  . Vaping Use: Never used  Substance and Sexual Activity  . Alcohol use: No  . Drug use: No  . Sexual activity: Not Currently   Tobacco Counseling Counseling given: Not Answered   Immunizations and Health Maintenance Immunization History  Administered Date(s) Administered  . Janssen (J&J) SARS-COV-2 Vaccination 07/23/2019   Health Maintenance Due  Topic Date Due  . COLONOSCOPY  Never done  . INFLUENZA VACCINE  12/03/2019    Activities of Daily Living In your present state of health, do you have any difficulty performing the following activities: 12/27/2019 09/27/2019  Hearing? N N  Vision? N N  Difficulty concentrating or making decisions? N N  Walking or climbing stairs? N N  Dressing or bathing? N N  Doing errands, shopping? N N  Preparing Food and eating ? N -  Using the Toilet? N -  In the past six months, have you accidently leaked urine? N -  Do you have problems with loss of bowel control? N -  Managing your Medications? N -  Managing your Finances? N -  Housekeeping or managing your Housekeeping? N -  Some recent data might be hidden    Advanced Directives: Does Patient Have a Medical Advance Directive?: No Would patient like information on creating a medical advance directive?: Yes (MAU/Ambulatory/Procedural Areas - Information given)    Assessment:    This is a routine wellness  examination for this patient .  Overall [name] reports that he is doing well.  Not having trouble sleeping.  No changes in chewing or swallowing or dentition changes.  Did have some recent dental work done.  Denies having any trouble with bowel or bladder habits.  Denies having blood in stool or urine.  Willing to do Cologuard ordered  today.  Denies having any memory issues.  Denies having any evidence of falls or injury.  Denies having any skin changes, hearing changes or vision changes.  Vision/Hearing screen  Hearing Screening   125Hz  250Hz  500Hz  1000Hz  2000Hz  3000Hz  4000Hz  6000Hz  8000Hz   Right ear:           Left ear:             Visual Acuity Screening   Right eye Left eye Both eyes  Without correction: 20/30 20/25 20/30   With correction:  Dietary issues and exercise activities discussed:  Current Exercise Habits: Home exercise routine, Type of exercise: walking, Time (Minutes): 15, Frequency (Times/Week): 7, Weekly Exercise (Minutes/Week): 105, Intensity: Mild, Exercise limited by: None identified  Goals    . Weight (lb) < 200 lb (90.7 kg)     Would like to get down below the 200lbs       Depression Screen PHQ 2/9 Scores 12/27/2019 12/27/2019 07/27/2019 06/19/2019  PHQ - 2 Score 0 0 0 0  PHQ- 9 Score - - - 0  Exception Documentation - - - -     Fall Risk Fall Risk  12/27/2019  Falls in the past year? 0  Number falls in past yr: 0  Injury with Fall? 0  Risk for fall due to : No Fall Risks  Follow up Falls evaluation completed    Cognitive Function MMSE - Mini Mental State Exam 12/27/2019  Orientation to time 5  Orientation to Place 5  Registration 3  Attention/ Calculation 5  Recall 3  Language- name 2 objects 2  Language- repeat 1  Language- follow 3 step command 3  Language- read & follow direction 1  Write a sentence 1  Copy design 1  Total score 30     6CIT Screen 12/27/2019  What Year? 0 points  What month? 0 points  What time? 0 points  Count back from 20 0 points  Months in reverse 0 points  Repeat phrase 0 points  Total Score 0    Patient Care Team: Perlie Mayo, NP as PCP - General (Family Medicine) Belva Crome, MD as PCP - Cardiology (Cardiology)     Plan:      1. Encounter for Medicare annual wellness exam -EKG - POCT URINALYSIS DIP (CLINITEK)  2.  Encounter for screening for malignant neoplasm of colon  - Cologuard  3. Stage 4 chronic kidney disease (HCC)  - CBC - Comprehensive metabolic panel  4. Essential hypertension  - CBC - Comprehensive metabolic panel  5. Hyperlipidemia, unspecified hyperlipidemia type  - Lipid panel  6. Need for immunization against influenza  - Flu Vaccine QUAD 36+ mos IM   I have personally reviewed and noted the following in the patient's chart:   . Medical and social history . Use of alcohol, tobacco or illicit drugs  . Current medications and supplements . Functional ability and status . Nutritional status . Physical activity . Advanced directives . List of other physicians . Hospitalizations, surgeries, and ER visits in previous 12 months . Vitals . Screenings to include cognitive, depression, and falls . Referrals and appointments   In addition, I have reviewed and discussed with patient certain preventive protocols, quality metrics, and best practice recommendations. A written personalized care plan for preventive services as well as general preventive health recommendations were provided to patient.    Perlie Mayo, NP 12/27/2019

## 2020-01-01 ENCOUNTER — Telehealth: Payer: Self-pay | Admitting: Orthopedic Surgery

## 2020-01-01 DIAGNOSIS — M1710 Unilateral primary osteoarthritis, unspecified knee: Secondary | ICD-10-CM

## 2020-01-01 NOTE — Telephone Encounter (Signed)
Patient wants to know when will he get his referral to see Dr. Ninfa Linden.  Patient states Dr. Aline Brochure was going to refer him to see him,. Please see below.  Best contact number is [phone].   Office note states  Referral to Dr. Ninfa Linden for possible left total knee

## 2020-01-01 NOTE — Telephone Encounter (Signed)
Put in referral, thanks

## 2020-01-01 NOTE — Addendum Note (Signed)
Addended byCandice Camp on: 01/01/2020 11:48 AM   Modules accepted: Orders

## 2020-01-02 ENCOUNTER — Encounter: Payer: Self-pay | Admitting: Family Medicine

## 2020-01-16 ENCOUNTER — Telehealth: Payer: Self-pay | Admitting: Orthopaedic Surgery

## 2020-01-16 ENCOUNTER — Ambulatory Visit: Payer: Self-pay

## 2020-01-16 ENCOUNTER — Ambulatory Visit (INDEPENDENT_AMBULATORY_CARE_PROVIDER_SITE_OTHER): Payer: Medicare HMO | Admitting: Orthopaedic Surgery

## 2020-01-16 ENCOUNTER — Ambulatory Visit (INDEPENDENT_AMBULATORY_CARE_PROVIDER_SITE_OTHER): Payer: Medicare HMO

## 2020-01-16 ENCOUNTER — Encounter: Payer: Self-pay | Admitting: Orthopaedic Surgery

## 2020-01-16 VITALS — Ht 72.0 in | Wt 204.0 lb

## 2020-01-16 DIAGNOSIS — M25561 Pain in right knee: Secondary | ICD-10-CM

## 2020-01-16 DIAGNOSIS — G8929 Other chronic pain: Secondary | ICD-10-CM | POA: Diagnosis not present

## 2020-01-16 DIAGNOSIS — M1712 Unilateral primary osteoarthritis, left knee: Secondary | ICD-10-CM

## 2020-01-16 DIAGNOSIS — M25562 Pain in left knee: Secondary | ICD-10-CM | POA: Diagnosis not present

## 2020-01-16 DIAGNOSIS — M1711 Unilateral primary osteoarthritis, right knee: Secondary | ICD-10-CM | POA: Diagnosis not present

## 2020-01-16 MED ORDER — METHYLPREDNISOLONE ACETATE 40 MG/ML IJ SUSP
40.0000 mg | INTRAMUSCULAR | Status: AC | PRN
  Administered 2020-01-16: 40 mg via INTRA_ARTICULAR

## 2020-01-16 MED ORDER — LIDOCAINE HCL 1 % IJ SOLN
0.5000 mL | INTRAMUSCULAR | Status: AC | PRN
  Administered 2020-01-16: .5 mL

## 2020-01-16 MED ORDER — LIDOCAINE HCL 1 % IJ SOLN
5.0000 mL | INTRAMUSCULAR | Status: AC | PRN
  Administered 2020-01-16: 5 mL

## 2020-01-16 NOTE — Progress Notes (Signed)
Office Visit Note   Patient: [name]           Date of Birth: 07-Jul-1955           MRN: [id number] Visit Date: 01/16/2020              Requested by: [address], [address],  [place] [address] PCP: Perlie Mayo, NP   Assessment & Plan: Visit Diagnoses:  1. Primary osteoarthritis of left knee   2. Primary osteoarthritis of right knee     Plan: Discussed with patient findings on radiographs recommend conservative treatment as he has had no conservative treatment prior.  We will try injections in both knees which he was agreeable to today and tolerated well.  See him back in 2 weeks to see what type of response he had to the injections in both knees.  Questions were encouraged and answered by Dr. Ninfa Linden and myself.  Follow-Up Instructions: Return in about 2 weeks (around 01/30/2020).   Orders:  Orders Placed This Encounter  Procedures  . Large Joint Inj: bilateral knee  . XR Knee 1-2 Views Left  . XR Knee 1-2 Views Right   No orders of the defined types were placed in this encounter.     Procedures: Large Joint Inj: bilateral knee on 01/16/2020 9:43 AM Indications: pain Details: 22 G 1.5 in needle, anterolateral approach  Arthrogram: No  Medications (Right): 0.5 mL lidocaine 1 %; 40 mg methylPREDNISolone acetate 40 MG/ML Medications (Left): 5 mL lidocaine 1 %; 40 mg methylPREDNISolone acetate 40 MG/ML Aspirate (Left): 1 mL blood-tinged Outcome: tolerated well, no immediate complications Procedure, treatment alternatives, risks and benefits explained, specific risks discussed. Consent was given by the patient. Immediately prior to procedure a time out was called to verify the correct patient, procedure, equipment, support staff and site/side marked as required. Patient was prepped and draped in the usual sterile fashion.       Clinical Data: No additional findings.   Subjective: Chief Complaint  Patient presents with  . Right  Knee - Pain  . Left Knee - Pain    HPI  Mr. Bitting is a 64 year old male were seen for the first time for bilateral knee pain.  He states he has had knee pain for some time.  He has had no significant treatment for these.  He reports he has been told that he has osteoarthritis of both knees and needs knee replacements.  He denies any fevers chills shortness breath chest pain.  He states that the left knee pain is worse than the right.  He has giving way sensation of both knees and painful popping in both knees.  He has had no acute injury to either knee. Patient has medical history pertinent for history of MRI with stent placements on chronic anticoagulation.  History of congestive heart failure.  Stage IV chronic kidney disease and hyperparathyroidism.  Review of Systems  Constitutional: Negative for chills and fever.  Respiratory: Negative for shortness of breath.   Cardiovascular: Negative for chest pain.     Objective: Vital Signs: Ht 6' (1.829 m)   Wt 204 lb (92.5 kg)   BMI 27.67 kg/m   Physical Exam Constitutional:      Appearance: He is not ill-appearing or diaphoretic.  Pulmonary:     Effort: Pulmonary effort is normal.  Neurological:     Mental Status: He is alert and oriented to person, place, and time.  Psychiatric:  Mood and Affect: Mood normal.     Ortho Exam Bilateral knees no abnormal warmth erythema.  Slight effusion left knee.  No instability valgus varus stressing either knee.  He has tenderness along the medial lateral joint line of both knees.  Patellofemoral crepitus bilateral knees with passive range of motion.  Overall good range of motion of both knees.  Varus deformities of both knees.  He walks with an antalgic gait without the use of any assistive device. Specialty Comments:  No specialty comments available.  Imaging: XR Knee 1-2 Views Left  Result Date: 01/16/2020 Left knee 2 views: Shows bone-on-bone lateral compartment with periarticular  spurring.  Tricompartmental changes in the.  Loose body seen in the popliteal region.  Varus deformity.  Knee is well located.  No acute fractures.  XR Knee 1-2 Views Right  Result Date: 01/16/2020 Right knee 2 views: No acute fractures.  Tricompartmental arthritis with moderate lateral compartmental narrowing and moderate patellofemoral changes.  Knee is well located.  Slight varus deformity.    PMFS History: Patient Active Problem List   Diagnosis Date Noted  . SOB (shortness of breath)   . Left renal stone 09/26/2019  . CAD in native artery   . Ischemic cardiomyopathy 08/15/2019  . Congestive heart failure ([place]) 03/21/2019  . ST elevation myocardial infarction involving left anterior descending (LAD) coronary artery ([place])   . Acute combined systolic and diastolic heart failure ([place])   . Chronic kidney disease 06/11/2016  . Elevated PSA 06/11/2016  . Hyperlipidemia 06/11/2016  . Essential hypertension 05/28/2016   Past Medical History:  Diagnosis Date  . Acute ST elevation myocardial infarction (STEMI) due to occlusion of mid portion of left anterior descending (LAD) coronary artery ([place]) 03/12/2019  . Acute systolic heart failure ([facility])   . Arthritis   . CHF (congestive heart failure) ([place])   . CKD (chronic kidney disease) stage 4, GFR 15-29 ml/min (HCC)   . Dyspnea    with exertionm periodically  . Heart attack ([place]) 03/15/2019   anterior MI   . History of kidney stones   . Hyperparathyroidism ([place])   . Hypertension 2002  . Ischemic cardiomyopathy   . Left knee pain   . Syncope 04/04/2019   after blood draw  . Vitamin D deficiency     Family History  Problem Relation Age of Onset  . Heart failure Mother   . Heart disease Mother   . Cancer Mother        breast  . Heart attack Father   . Hypertension Father   . Heart disease Father   . Diabetes Brother     Past Surgical History:  Procedure Laterality Date  . AMPUTATION TOE Left 1991   2 digit of left foot  .  CHOLECYSTECTOMY    . COLONOSCOPY    . CORONARY/GRAFT ACUTE MI REVASCULARIZATION N/A 03/12/2019   Procedure: Coronary/Graft Acute MI Revascularization;  Surgeon: Belva Crome, MD;  Location: [facility];  Service: Cardiovascular;  Laterality: N/A;  . ICD IMPLANT N/A 08/24/2019   Procedure: ICD IMPLANT;  Surgeon: Evans Lance, MD;  Location: [facility];  Service: Cardiovascular;  Laterality: N/A;  . IR URETERAL STENT LEFT NEW ACCESS W/O SEP NEPHROSTOMY CATH  09/26/2019  . LEFT HEART CATH AND CORONARY ANGIOGRAPHY N/A 03/12/2019   Procedure: LEFT HEART CATH AND CORONARY ANGIOGRAPHY;  Surgeon: Belva Crome, MD;  Location: [facility];  Service: Cardiovascular;  Laterality: N/A;  . LITHOTRIPSY  Right   . NEPHROLITHOTOMY Left 09/26/2019   Procedure: LEFT NEPHROLITHOTOMY PERCUTANEOUS;  Surgeon: Irine Seal, MD;  Location: [facility];  Service: Urology;  Laterality: Left;   Social History   Occupational History  . Occupation: retired  Tobacco Use  . Smoking status: Former Smoker    Packs/day: 0.50    Years: 40.00    Pack years: 20.00    Types: Cigarettes    Quit date: 03/12/2019    Years since quitting: 0.8  . Smokeless tobacco: Never Used  Vaping Use  . Vaping Use: Never used  Substance and Sexual Activity  . Alcohol use: No  . Drug use: No  . Sexual activity: Not Currently

## 2020-01-16 NOTE — Telephone Encounter (Signed)
Patient called requesting a prescription of hydrocodone 10 mg. Please send to pharmacy on file. Patient asked to be called when prescription is sent to pharmacy. Patient phone number is [phone].

## 2020-01-17 MED ORDER — HYDROCODONE-ACETAMINOPHEN 5-325 MG PO TABS: 1.0000 | ORAL_TABLET | Freq: Four times a day (QID) | ORAL | 0 refills | Status: DC | PRN

## 2020-01-17 NOTE — Telephone Encounter (Signed)
We saw this patient yesterday for osteoarthritis of his knees.  Let him know that we will send in a prescription for Norco 5/325's and not hydrocodone tens.  Also, this is a one-time prescription.  Let them know that we do not usually provide long-term narcotics for osteoarthritis pain.

## 2020-01-17 NOTE — Telephone Encounter (Signed)
Patient aware Rx sent in  

## 2020-01-30 ENCOUNTER — Telehealth: Payer: Self-pay

## 2020-01-30 ENCOUNTER — Encounter: Payer: Self-pay | Admitting: Orthopaedic Surgery

## 2020-01-30 ENCOUNTER — Ambulatory Visit (INDEPENDENT_AMBULATORY_CARE_PROVIDER_SITE_OTHER): Payer: Medicare HMO | Admitting: Orthopaedic Surgery

## 2020-01-30 DIAGNOSIS — M1712 Unilateral primary osteoarthritis, left knee: Secondary | ICD-10-CM

## 2020-01-30 DIAGNOSIS — M1711 Unilateral primary osteoarthritis, right knee: Secondary | ICD-10-CM

## 2020-01-30 DIAGNOSIS — M170 Bilateral primary osteoarthritis of knee: Secondary | ICD-10-CM | POA: Diagnosis not present

## 2020-01-30 MED ORDER — HYDROCODONE-ACETAMINOPHEN 10-325 MG PO TABS: 1.0000 | ORAL_TABLET | Freq: Four times a day (QID) | ORAL | 0 refills | Status: DC | PRN

## 2020-01-30 NOTE — Addendum Note (Signed)
Addended byLaurann Montana on: 01/30/2020 10:31 AM   Modules accepted: Orders

## 2020-01-30 NOTE — Telephone Encounter (Signed)
Noted  

## 2020-01-30 NOTE — Progress Notes (Signed)
The patient returns 2 weeks after we placed her injections in both of his knees to treat the pain from osteoarthritis.  He says his knees do feel much better overall.  He does have severe end-stage arthritis of both knees.  He is requesting hydrocodone tens.  I explained to him that I can provide this 1 time but cannot keep him on chronic narcotic pain medication.  Hopefully he is a candidate for hyaluronic acid and we will submit this for his knees.  I would also like to send him to outpatient physical therapy to work on strengthening the muscles around both his knees.  On exam today both hips move smoothly and fluidly.  Both knees have varus malalignment and patellofemoral crepitation but no significant effusion today.  He agrees with this treatment plan with trying some physical therapy for his knees.  We will resubmit for hyaluronic acid if his insurance will cover it for both knees to treat the pain from osteoarthritis.  I will see him back in 6 weeks.  All questions concerns were answered and addressed.

## 2020-01-30 NOTE — Telephone Encounter (Signed)
Can we get auth for bilat knee gel injections?

## 2020-02-02 NOTE — Telephone Encounter (Signed)
Submitted for VOB for Monovisc-Bilateral knee  

## 2020-02-02 NOTE — Telephone Encounter (Signed)
.   Thank You Autumn

## 2020-02-05 ENCOUNTER — Telehealth: Payer: Self-pay

## 2020-02-05 NOTE — Telephone Encounter (Signed)
Called pt and scheduled

## 2020-02-05 NOTE — Telephone Encounter (Signed)
Ok to schedule - next available.

## 2020-02-05 NOTE — Telephone Encounter (Signed)
Approved for Monovisc-Bilateral knee Dr. Margarito Liner and Bill Covered @ 100%  No copay Prior auth required [employer] auth # [id number] Dates: 02/05/2020-05/04/2020

## 2020-02-12 ENCOUNTER — Encounter: Payer: Self-pay | Admitting: Orthopaedic Surgery

## 2020-02-12 ENCOUNTER — Ambulatory Visit (INDEPENDENT_AMBULATORY_CARE_PROVIDER_SITE_OTHER): Payer: Medicare HMO | Admitting: Orthopaedic Surgery

## 2020-02-12 DIAGNOSIS — M1711 Unilateral primary osteoarthritis, right knee: Secondary | ICD-10-CM | POA: Diagnosis not present

## 2020-02-12 DIAGNOSIS — M170 Bilateral primary osteoarthritis of knee: Secondary | ICD-10-CM

## 2020-02-12 DIAGNOSIS — M1712 Unilateral primary osteoarthritis, left knee: Secondary | ICD-10-CM

## 2020-02-12 MED ORDER — HYALURONAN 88 MG/4ML IX SOSY
88.0000 mg | PREFILLED_SYRINGE | INTRA_ARTICULAR | Status: AC | PRN
  Administered 2020-02-12: 88 mg via INTRA_ARTICULAR

## 2020-02-12 MED ORDER — HYDROCODONE-ACETAMINOPHEN 10-325 MG PO TABS
1.0000 | ORAL_TABLET | Freq: Four times a day (QID) | ORAL | 0 refills | Status: DC | PRN
Start: 2020-02-12 — End: 2020-02-27

## 2020-02-12 NOTE — Progress Notes (Signed)
   Procedure Note  Patient: [name]             Date of Birth: 10-25-1955           MRN: [id number]             Visit Date: 02/12/2020  Procedures: Visit Diagnoses:  1. Primary osteoarthritis of left knee   2. Primary osteoarthritis of right knee     Large Joint Inj: R knee on 02/12/2020 8:53 AM Indications: diagnostic evaluation and pain Details: 22 G 1.5 in needle, superolateral approach  Arthrogram: No  Medications: 88 mg Hyaluronan 88 MG/4ML Outcome: tolerated well, no immediate complications Procedure, treatment alternatives, risks and benefits explained, specific risks discussed. Consent was given by the patient. Immediately prior to procedure a time out was called to verify the correct patient, procedure, equipment, support staff and site/side marked as required. Patient was prepped and draped in the usual sterile fashion.   Large Joint Inj: L knee on 02/12/2020 8:54 AM Indications: diagnostic evaluation and pain Details: 22 G 1.5 in needle, superolateral approach  Arthrogram: No  Medications: 88 mg Hyaluronan 88 MG/4ML Outcome: tolerated well, no immediate complications Procedure, treatment alternatives, risks and benefits explained, specific risks discussed. Consent was given by the patient. Immediately prior to procedure a time out was called to verify the correct patient, procedure, equipment, support staff and site/side marked as required. Patient was prepped and draped in the usual sterile fashion.    The patient is here today for scheduled Monovisc injections in both knees to treat the pain from osteoarthritis.  The patient has failed other conservative treatment measures.  He is also requesting hydrocodone tens.  I did let him know that I will refill this only 1 more time and after that he would need either chronic pain management referral or getting this patient primary care physician.  There is no knee effusion on either knee today but a painful arc of  motion.  I did place Monovisc in both knees without difficulty.  All questions and concerns were answered and addressed.  Follow-up will be as needed.

## 2020-02-14 ENCOUNTER — Ambulatory Visit (HOSPITAL_COMMUNITY): Payer: Medicare HMO | Admitting: Physical Therapy

## 2020-02-15 ENCOUNTER — Encounter (HOSPITAL_COMMUNITY): Payer: Self-pay

## 2020-02-15 ENCOUNTER — Other Ambulatory Visit: Payer: Self-pay

## 2020-02-15 ENCOUNTER — Ambulatory Visit (HOSPITAL_COMMUNITY)
Admission: RE | Admit: 2020-02-15 | Discharge: 2020-02-15 | Disposition: A | Discharge status: Home / Self Care | Payer: Medicare HMO | Source: Ambulatory Visit | Attending: Cardiology | Admitting: Cardiology

## 2020-02-15 VITALS — BP 126/88 | HR 60 | Wt 201.2 lb

## 2020-02-15 DIAGNOSIS — I130 Hypertensive heart and chronic kidney disease with heart failure and stage 1 through stage 4 chronic kidney disease, or unspecified chronic kidney disease: Secondary | ICD-10-CM | POA: Insufficient documentation

## 2020-02-15 DIAGNOSIS — Z7982 Long term (current) use of aspirin: Secondary | ICD-10-CM | POA: Insufficient documentation

## 2020-02-15 DIAGNOSIS — I2510 Atherosclerotic heart disease of native coronary artery without angina pectoris: Secondary | ICD-10-CM | POA: Insufficient documentation

## 2020-02-15 DIAGNOSIS — Z87891 Personal history of nicotine dependence: Secondary | ICD-10-CM | POA: Insufficient documentation

## 2020-02-15 DIAGNOSIS — I255 Ischemic cardiomyopathy: Secondary | ICD-10-CM | POA: Diagnosis not present

## 2020-02-15 DIAGNOSIS — N184 Chronic kidney disease, stage 4 (severe): Secondary | ICD-10-CM | POA: Diagnosis not present

## 2020-02-15 DIAGNOSIS — M1990 Unspecified osteoarthritis, unspecified site: Secondary | ICD-10-CM | POA: Insufficient documentation

## 2020-02-15 DIAGNOSIS — I252 Old myocardial infarction: Secondary | ICD-10-CM | POA: Diagnosis not present

## 2020-02-15 DIAGNOSIS — Z955 Presence of coronary angioplasty implant and graft: Secondary | ICD-10-CM | POA: Diagnosis not present

## 2020-02-15 DIAGNOSIS — I5022 Chronic systolic (congestive) heart failure: Secondary | ICD-10-CM | POA: Diagnosis not present

## 2020-02-15 DIAGNOSIS — Z79899 Other long term (current) drug therapy: Secondary | ICD-10-CM | POA: Diagnosis not present

## 2020-02-15 LAB — COMPREHENSIVE METABOLIC PANEL
ALT: 12 U/L (ref 0–44)
AST: 13 U/L — ABNORMAL LOW (ref 15–41)
Albumin: 3.6 g/dL (ref 3.5–5.0)
Alkaline Phosphatase: 78 U/L (ref 38–126)
Anion gap: 11 (ref 5–15)
BUN: 40 mg/dL — ABNORMAL HIGH (ref 8–23)
CO2: 21 mmol/L — ABNORMAL LOW (ref 22–32)
Calcium: 9.9 mg/dL (ref 8.9–10.3)
Chloride: 109 mmol/L (ref 98–111)
Creatinine, Ser: 3.66 mg/dL — ABNORMAL HIGH (ref 0.61–1.24)
GFR, Estimated: 17 mL/min — ABNORMAL LOW (ref >60)
Glucose, Bld: 103 mg/dL — ABNORMAL HIGH (ref 70–99)
Potassium: 4.3 mmol/L (ref 3.5–5.1)
Sodium: 141 mmol/L (ref 135–145)
Total Bilirubin: 1.4 mg/dL — ABNORMAL HIGH (ref 0.3–1.2)
Total Protein: 6.8 g/dL (ref 6.5–8.1)

## 2020-02-15 LAB — CBC
HCT: 39.5 % (ref 39.0–52.0)
Hemoglobin: 12.5 g/dL — ABNORMAL LOW (ref 13.0–17.0)
MCH: 31 pg (ref 26.0–34.0)
MCHC: 31.6 g/dL (ref 30.0–36.0)
MCV: 98 fL (ref 80.0–100.0)
Platelets: 152 10*3/uL (ref 150–400)
RBC: 4.03 MIL/uL — ABNORMAL LOW (ref 4.22–5.81)
RDW: 14.9 % (ref 11.5–15.5)
WBC: 5.4 10*3/uL (ref 4.0–10.5)
nRBC: 0 % (ref 0.0–0.2)

## 2020-02-15 LAB — LIPID PANEL
Cholesterol: 129 mg/dL (ref 0–200)
HDL: 55 mg/dL (ref >40)
LDL Cholesterol: 42 mg/dL (ref 0–99)
Total CHOL/HDL Ratio: 2.3 RATIO
Triglycerides: 158 mg/dL — ABNORMAL HIGH (ref <150)
VLDL: 32 mg/dL (ref 0–40)

## 2020-02-15 NOTE — Patient Instructions (Signed)
Labs done today, your results will be available in MyChart, we will contact you for abnormal readings.  Your physician recommends that you have a follow up appointment in 4 months  If you have any questions or concerns before your next appointment please send [place] a message through [place] or call our office at [phone].    TO LEAVE A MESSAGE FOR THE NURSE SELECT OPTION 2, PLEASE LEAVE A MESSAGE INCLUDING: . YOUR NAME . DATE OF BIRTH . CALL BACK NUMBER . REASON FOR CALL**this is important as we prioritize the call backs  YOU WILL RECEIVE A CALL BACK THE SAME DAY AS LONG AS YOU CALL BEFORE 4:00 PM

## 2020-02-15 NOTE — Addendum Note (Signed)
Encounter addended by: Consuelo Pandy, PA-C on: 02/15/2020 12:29 PM  Actions taken: Clinical Note Signed

## 2020-02-15 NOTE — Progress Notes (Addendum)
ADVANCED HF CLINIC PROGRESS NOTE  Primary Cardiologist: DR. Burt Knack Primary Care: Dr. Orson Ape AHF: Dr. Haroldine Laws   HPI: 64 y/o male, former smoker with HTN, CKD IV, CAD and systolic HF. Experienced a large anterolateral STEMI on 03/12/19. Has [employer] ICD.   Cath 03/12/2019  Total occlusion of the mid LAD treated with angioplasty and stenting using a 4.0 x 22 Onyx deployed at 14 atm. 0% stenosis was noted post procedure and TIMI grade III flow increased from 0 to 3. The final angiographic images of the LAD demonstrates apical occlusion due to embolus.  Luminal irregularities in the left main but no agreeable stenosis.  Large circumflex with 3 obtuse marginals. The first marginal is large and has a very distal 90% stenosis in 2 branches. There is a trifurcation distally in the first marginal.  RCA Moderate diffuse luminal irregularities but widely patent.  Mid anterior wall to apical akinesis. EF 30 to 35% acutely. LVEDP 31 mmHg.   Post cath had low BP and shock but recovered. EF 20% by echo. No significant MR. Creatinine post cath 3.1-3.4. Now followed by nephrology. Has been taking Lasix 40 mg daily.    Had recent repeat echo 3/21. EF 20-25%. RV normal.  S/P [employer] ICD implant 08/2019   Admitted 09/26/19 with large left renal stone. S/P left nephrolithotomy. Discharged  09/30/19.  He presents to [facility] today for routine f/u. He is doing well. No dyspnea, orthopnea or PND. Denies Wt gain. Wt down 4 lb since last visit 7/21. No LEE. HeartLogic Score is 0. No VT on device interrogation. Denies CP. Reports full compliance w/ meds. No abnormal bleeding w/ DAPT. BP is well controlled. HR in the 60s. No longer smoking.   Review of systems complete and found to be negative unless listed in HPI.     Past Medical History:  Diagnosis Date  . Acute ST elevation myocardial infarction (STEMI) due to occlusion of mid portion of left anterior descending (LAD) coronary  artery ([place]) 03/12/2019  . Acute systolic heart failure ([place])   . Arthritis   . CHF (congestive heart failure) ([place])   . CKD (chronic kidney disease) stage 4, GFR 15-29 ml/min (HCC)   . Dyspnea    with exertionm periodically  . Heart attack ([place]) 03/15/2019   anterior MI   . History of kidney stones   . Hyperparathyroidism ([place])   . Hypertension 2002  . Ischemic cardiomyopathy   . Left knee pain   . Syncope 04/04/2019   after blood draw  . Vitamin D deficiency     Current Outpatient Medications  Medication Sig Dispense Refill  . allopurinol (ZYLOPRIM) 100 MG tablet Take 100 mg by mouth daily.     Marland Kitchen aspirin 81 MG chewable tablet Chew 1 tablet (81 mg total) by mouth daily. 90 tablet 1  . atorvastatin (LIPITOR) 80 MG tablet Take 1 tablet (80 mg total) by mouth daily at 6 PM. 90 tablet 1  . BIDIL 20-37.5 MG tablet Take 1 tablet by mouth twice daily 180 tablet 3  . carvedilol (COREG) 12.5 MG tablet TAKE 1 TABLET BY MOUTH TWICE DAILY WITH MEALS 180 tablet 3  . furosemide (LASIX) 40 MG tablet TAKE 1 TABLET BY MOUTH ONCE DAILY AS NEEDED FOR FLUID 30 tablet 5  . HYDROcodone-acetaminophen (NORCO) 10-325 MG tablet Take 1 tablet by mouth every 6 (six) hours as needed. 30 tablet 0  . ivabradine (CORLANOR) 5 MG TABS tablet Take 1 tablet (5 mg total)  by mouth 2 (two) times daily with a meal. 60 tablet 11  . potassium citrate (UROCIT-K) 10 MEQ (1080 MG) SR tablet Take 1 tablet by mouth daily.    . sodium bicarbonate 650 MG tablet Take 650 mg by mouth 3 (three) times daily.    . ticagrelor (BRILINTA) 90 MG TABS tablet Take 1 tablet (90 mg total) by mouth 2 (two) times daily. 180 tablet 1  . nitroGLYCERIN (NITROSTAT) 0.4 MG SL tablet Place 1 tablet (0.4 mg total) under the tongue every 5 (five) minutes as needed. (Patient not taking: Reported on 02/15/2020) 25 tablet 2   No current facility-administered medications for this encounter.    No Known Allergies    Social History   Socioeconomic  History  . Marital status: Single    Spouse name: Not on file  . Number of children: 0  . Years of education: Not on file  . Highest education level: Some college, no degree  Occupational History  . Occupation: retired  Tobacco Use  . Smoking status: Former Smoker    Packs/day: 0.50    Years: 40.00    Pack years: 20.00    Types: Cigarettes    Quit date: 03/12/2019    Years since quitting: 0.9  . Smokeless tobacco: Never Used  Vaping Use  . Vaping Use: Never used  Substance and Sexual Activity  . Alcohol use: No  . Drug use: No  . Sexual activity: Not Currently  Other Topics Concern  . Not on file  Social History Narrative   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       Enjoys: fishing, shopping, working around the house       Diet: working on changes, avoiding fried foods, increased veggies   Caffeine: teas some daily   Water: 6-8 cups daily       Wears seat belt    Does not use phone while driving    Oceanographer at home    Social Determinants of Health   Financial Resource Strain: Low Risk   . Difficulty of Paying Living Expenses: Not hard at all  Food Insecurity: No Food Insecurity  . Worried About Charity fundraiser in the Last Year: Never true  . Ran Out of Food in the Last Year: Never true  Transportation Needs: No Transportation Needs  . Lack of Transportation (Medical): No  . Lack of Transportation (Non-Medical): No  Physical Activity: Insufficiently Active  . Days of Exercise per Week: 7 days  . Minutes of Exercise per Session: 10 min  Stress: No Stress Concern Present  . Feeling of Stress : Not at all  Social Connections: Moderately Isolated  . Frequency of Communication with Friends and Family: More than three times a week  . Frequency of Social Gatherings with Friends and Family: More than three times a week  . Attends Religious Services: More than 4 times per year  . Active Member of Clubs or Organizations: No  . Attends Archivist  Meetings: Never  . Marital Status: Never married  Intimate Partner Violence: Not At Risk  . Fear of Current or Ex-Partner: No  . Emotionally Abused: No  . Physically Abused: No  . Sexually Abused: No      Family History  Problem Relation Age of Onset  . Heart failure Mother   . Heart disease Mother   . Cancer Mother        breast  . Heart attack Father   .  Hypertension Father   . Heart disease Father   . Diabetes Brother     Vitals:   02/15/20 0915  BP: 126/88  Pulse: 60  SpO2: 98%  Weight: 91.3 kg (201 lb 3.2 oz)    Wt Readings from Last 3 Encounters:  02/15/20 91.3 kg (201 lb 3.2 oz)  01/16/20 92.5 kg (204 lb)  12/27/19 95 kg (209 lb 6.4 oz)     PHYSICAL EXAM: General:  Well appearing. No respiratory difficulty HEENT: normal Neck: supple. no JVD. Carotids 2+ bilat; no bruits. No lymphadenopathy or thyromegaly appreciated. Cor: PMI nondisplaced. Regular rate & rhythm. No rubs, gallops or murmurs. Lungs: clear Abdomen: soft, nontender, nondistended. No hepatosplenomegaly. No bruits or masses. Good bowel sounds. Extremities: no cyanosis, clubbing, rash, edema Neuro: alert & oriented x 3, cranial nerves grossly intact. moves all 4 extremities w/o difficulty. Affect pleasant.    ASSESSMENT & PLAN:  1. CAD s/p acute anterolateral STEMI 03/12/19 - s/p DES LAD - stable w/o anginal symptoms  - Continue DAPT w/ ASA + Brilinta. After 03/11/20, can reduce Brilinta to 60 mg bid  - continue atorvastatin 80 mg. Check FLP and HFTs today. LDL goal < 70.   2. Chronic Sstolic HF - EF 28%. Following anteriorlateral stemi - Repeat Echo 3/21: EF 20-25%. RV ok.  - Has [employer] ICD.  - NYHA II. Volume status stable.  Heart Logic Score 0 - Continue lasix 40 mg daily  - Continue Bidil 1 tab TID  - Continue carvedilol 12.5 bid.  - Continue ivabradine 5 mg twice a day.  - Not candidate for Entresto, SGLT2i or spiro with CKD IV. - Check BMP today   3. CKD IV - due to  HTN nephropathy - Baseline Creatinine around 3.0 (GFR 22) - Follows with Dr. Hinda Lenis - Check BMP today  4. HTN - well controlled on current regimen - no change today     5. Tobacco use - quit after MI  - congratulated on cessation  Follow up 3-4 months with Dr Haroldine Laws.     Lyda Jester, PA-C  9:19 AM

## 2020-02-16 ENCOUNTER — Telehealth (HOSPITAL_COMMUNITY): Payer: Self-pay

## 2020-02-16 ENCOUNTER — Telehealth (HOSPITAL_COMMUNITY): Payer: Self-pay | Admitting: *Deleted

## 2020-02-16 NOTE — Telephone Encounter (Signed)
Pt left VM requesting return call. Pt asked for his hemoglobin results. Called pt no answer/left vm requesting return call.

## 2020-02-16 NOTE — Telephone Encounter (Signed)
Patient called inquiring about lab results. Please advise

## 2020-02-20 ENCOUNTER — Other Ambulatory Visit (HOSPITAL_COMMUNITY): Payer: Self-pay | Admitting: Internal Medicine

## 2020-02-20 ENCOUNTER — Other Ambulatory Visit: Payer: Self-pay

## 2020-02-20 ENCOUNTER — Other Ambulatory Visit: Payer: Self-pay | Admitting: Family Medicine

## 2020-02-20 MED ORDER — ATORVASTATIN CALCIUM 80 MG PO TABS
80.0000 mg | ORAL_TABLET | Freq: Every day | ORAL | 1 refills | Status: DC
Start: 2020-02-20 — End: 2020-03-04

## 2020-02-21 ENCOUNTER — Ambulatory Visit: Payer: Medicare HMO | Admitting: Physician Assistant

## 2020-02-23 ENCOUNTER — Ambulatory Visit (INDEPENDENT_AMBULATORY_CARE_PROVIDER_SITE_OTHER): Payer: Medicare HMO

## 2020-02-23 DIAGNOSIS — I255 Ischemic cardiomyopathy: Secondary | ICD-10-CM | POA: Diagnosis not present

## 2020-02-24 LAB — CUP PACEART REMOTE DEVICE CHECK
Battery Remaining Longevity: 174 mo
Battery Remaining Percentage: 100 %
Brady Statistic RV Percent Paced: 0 %
Date Time Interrogation Session: 20211022040200
HighPow Impedance: 72 Ohm
Implantable Lead Implant Date: 20210422
Implantable Lead Location: 753860
Implantable Lead Model: 138
Implantable Lead Serial Number: 302700
Implantable Pulse Generator Implant Date: 20210422
Lead Channel Impedance Value: 438 Ohm
Lead Channel Setting Pacing Amplitude: 2.5 V
Lead Channel Setting Pacing Pulse Width: 0.4 ms
Lead Channel Setting Sensing Sensitivity: 0.5 mV
Pulse Gen Serial Number: 209495

## 2020-02-27 ENCOUNTER — Telehealth: Payer: Self-pay

## 2020-02-27 ENCOUNTER — Other Ambulatory Visit: Payer: Self-pay | Admitting: Orthopaedic Surgery

## 2020-02-27 MED ORDER — HYDROCODONE-ACETAMINOPHEN 10-325 MG PO TABS: 1.0000 | ORAL_TABLET | Freq: Two times a day (BID) | ORAL | 0 refills | Status: DC | PRN

## 2020-02-27 NOTE — Telephone Encounter (Signed)
I did send in some more hydrocodone.  The patient needs to know that this is the last time and I can send this in.  They do not need to ask for this again is a refill.  We do not treat osteoarthritis pain with narcotics.  He can get this from his primary care physician if still needed.  The only other option from our standpoint to treat arthritis pain in her joint is a knee replacement.

## 2020-02-27 NOTE — Telephone Encounter (Signed)
Patient aware.

## 2020-02-27 NOTE — Telephone Encounter (Signed)
Patient called in wanting to refill of hydrocodone. Would like to be sent to [facility] in [place]

## 2020-02-27 NOTE — Telephone Encounter (Signed)
Please advise 

## 2020-02-28 NOTE — Progress Notes (Signed)
Remote ICD transmission.   

## 2020-03-04 ENCOUNTER — Other Ambulatory Visit: Payer: Self-pay

## 2020-03-04 MED ORDER — BLOOD GLUCOSE METER KIT: PACK | 0 refills | Status: DC

## 2020-03-04 MED ORDER — TRUEPLUS LANCETS 30G MISC: 1 refills | Status: DC

## 2020-03-04 MED ORDER — BD SWAB SINGLE USE REGULAR PADS: 1.0000 | MEDICATED_PAD | 0 refills | Status: DC

## 2020-03-04 MED ORDER — ATORVASTATIN CALCIUM 80 MG PO TABS
80.0000 mg | ORAL_TABLET | Freq: Every day | ORAL | 1 refills | Status: DC
Start: 2020-03-04 — End: 2020-04-25

## 2020-03-05 ENCOUNTER — Other Ambulatory Visit: Payer: Self-pay | Admitting: Family Medicine

## 2020-03-12 ENCOUNTER — Other Ambulatory Visit: Payer: Self-pay

## 2020-03-12 ENCOUNTER — Ambulatory Visit (INDEPENDENT_AMBULATORY_CARE_PROVIDER_SITE_OTHER): Payer: Medicare HMO | Admitting: Orthopaedic Surgery

## 2020-03-12 ENCOUNTER — Encounter: Payer: Self-pay | Admitting: Orthopaedic Surgery

## 2020-03-12 DIAGNOSIS — M170 Bilateral primary osteoarthritis of knee: Secondary | ICD-10-CM

## 2020-03-12 DIAGNOSIS — M1711 Unilateral primary osteoarthritis, right knee: Secondary | ICD-10-CM | POA: Diagnosis not present

## 2020-03-12 DIAGNOSIS — M1712 Unilateral primary osteoarthritis, left knee: Secondary | ICD-10-CM | POA: Diagnosis not present

## 2020-03-12 DIAGNOSIS — G8929 Other chronic pain: Secondary | ICD-10-CM

## 2020-03-12 DIAGNOSIS — M25562 Pain in left knee: Secondary | ICD-10-CM

## 2020-03-12 DIAGNOSIS — M25561 Pain in right knee: Secondary | ICD-10-CM

## 2020-03-12 MED ORDER — HYDROCODONE-ACETAMINOPHEN 10-325 MG PO TABS
1.0000 | ORAL_TABLET | Freq: Two times a day (BID) | ORAL | 0 refills | Status: DC | PRN
Start: 2020-03-12 — End: 2021-02-27

## 2020-03-12 NOTE — Progress Notes (Signed)
The patient comes in for continued evaluation treatment of end-stage arthritis of both his knees and chronic bilateral knee pain.  He is requesting hydrocodone 10's once again.  I tried to explain that to him on several visits that we are not a chronic pain specialist nor will we keep him on this type of medication at all.  He does need a chronic pain management referral.  He may not be a surgical candidate due to significant cardiac issues.  We have provided steroid injections in his knees in the last month provided hyaluronic acid in his knees.  He says that is not helping and is requesting hydrocodone again.  I had a long and thorough discussion with him about that is not the appropriate medical treatment from an orthopedic surgery standpoint for osteoarthritis of the knees and that if I provide this medication today I could not provide it again at all.  He agreed to at least [place] making a chronic pain management referral.   We can place steroid injections in his knees in 3 months and I would expect him to do that.  There is really nothing else that I can recommend.

## 2020-03-14 ENCOUNTER — Encounter: Payer: Self-pay | Admitting: Physical Medicine and Rehabilitation

## 2020-04-09 ENCOUNTER — Ambulatory Visit: Payer: Medicare HMO | Admitting: Dermatology

## 2020-04-19 ENCOUNTER — Ambulatory Visit: Payer: Medicare HMO | Admitting: Urology

## 2020-04-25 ENCOUNTER — Other Ambulatory Visit (HOSPITAL_COMMUNITY): Payer: Self-pay | Admitting: Internal Medicine

## 2020-04-25 ENCOUNTER — Other Ambulatory Visit: Payer: Self-pay | Admitting: Family Medicine

## 2020-04-26 ENCOUNTER — Ambulatory Visit
Admission: EM | Admit: 2020-04-26 | Discharge: 2020-04-26 | Disposition: A | Discharge status: Home / Self Care | Payer: Medicare HMO | Attending: Family Medicine | Admitting: Family Medicine

## 2020-04-26 ENCOUNTER — Encounter: Payer: Self-pay | Admitting: Emergency Medicine

## 2020-04-26 DIAGNOSIS — R059 Cough, unspecified: Secondary | ICD-10-CM

## 2020-04-26 DIAGNOSIS — M109 Gout, unspecified: Secondary | ICD-10-CM

## 2020-04-26 DIAGNOSIS — R0981 Nasal congestion: Secondary | ICD-10-CM | POA: Diagnosis not present

## 2020-04-26 DIAGNOSIS — M79675 Pain in left toe(s): Secondary | ICD-10-CM | POA: Diagnosis not present

## 2020-04-26 DIAGNOSIS — B349 Viral infection, unspecified: Secondary | ICD-10-CM | POA: Diagnosis not present

## 2020-04-26 MED ORDER — PREDNISONE 20 MG PO TABS: 20.0000 mg | ORAL_TABLET | Freq: Every day | ORAL | 0 refills | Status: AC

## 2020-04-26 MED ORDER — PROMETHAZINE-DM 6.25-15 MG/5ML PO SYRP
5.0000 mL | ORAL_SOLUTION | Freq: Four times a day (QID) | ORAL | 0 refills | Status: DC | PRN
Start: 2020-04-26 — End: 2021-03-18

## 2020-04-26 MED ORDER — DEXAMETHASONE SODIUM PHOSPHATE 10 MG/ML IJ SOLN
10.0000 mg | Freq: Once | INTRAMUSCULAR | Status: AC
  Administered 2020-04-26: 10 mg via INTRAMUSCULAR

## 2020-04-26 NOTE — Discharge Instructions (Addendum)
You have received a steroid injection in the office today  I have sent in prednisone for you to take one daily for 5 days  I have sent in cough syrup for you as well. This medication can make you sleepy. Do no drive while taking this medication.  Your COVID and Flu tests are pending.  You should self quarantine until the test results are back.    Take Tylenol or ibuprofen as needed for fever or discomfort.  Rest and keep yourself hydrated.    Follow-up with your primary care provider if your symptoms are not improving.

## 2020-04-26 NOTE — ED Triage Notes (Addendum)
Pain to LT big toe x 2 days. Hx of gout. Takes allopurinol. Also c/o cough and runny nose for past 3 days. Would like covid test and cough syrup.

## 2020-04-28 NOTE — ED Provider Notes (Signed)
[facility]   [id number] 04/26/20 Arrival Time: 1001  SF:SELTR PAIN  SUBJECTIVE: History from: patient. [name] is a 64 y.o. male complains of left great toe pain for the last 2 days.  Reports that he has a history of gout and that this feels the same.  Has not taken any over-the-counter medications for this.  Describes the pain as constant and achy in character. Cannot take NSAIDS or colchicine due to kidney function. Symptoms are made worse with activity.  Reports that he can hardly put weight on the joint.  Denies fever, chills, ecchymosis, weakness, numbness and tingling, saddle paresthesias, loss of bowel or bladder function.      Also complains that he has had a cough for the last 3 days.  Has tried over-the-counter cough and cold with no relief.  Has negative history of Covid.  Has completed Covid vaccines.  Has not had Covid booster.  Denies headache, sore throat, nausea, vomiting, diarrhea, rash, fever, other symptoms. Requesting Covid testing today.  ROS: As per HPI.  All other pertinent ROS negative.     Past Medical History:  Diagnosis Date  . Acute ST elevation myocardial infarction (STEMI) due to occlusion of mid portion of left anterior descending (LAD) coronary artery ([place]) 03/12/2019  . Acute systolic heart failure ([place])   . Arthritis   . CHF (congestive heart failure) ([place])   . CKD (chronic kidney disease) stage 4, GFR 15-29 ml/min (HCC)   . Dyspnea    with exertionm periodically  . Heart attack ([place]) 03/15/2019   anterior MI   . History of kidney stones   . Hyperparathyroidism ([place])   . Hypertension 2002  . Ischemic cardiomyopathy   . Left knee pain   . Syncope 04/04/2019   after blood draw  . Vitamin D deficiency    Past Surgical History:  Procedure Laterality Date  . AMPUTATION TOE Left 1991   2 digit of left foot  . CHOLECYSTECTOMY    . COLONOSCOPY    . CORONARY/GRAFT ACUTE MI REVASCULARIZATION N/A 03/12/2019   Procedure:  Coronary/Graft Acute MI Revascularization;  Surgeon: Belva Crome, MD;  Location: [facility];  Service: Cardiovascular;  Laterality: N/A;  . ICD IMPLANT N/A 08/24/2019   Procedure: ICD IMPLANT;  Surgeon: Evans Lance, MD;  Location: [facility];  Service: Cardiovascular;  Laterality: N/A;  . IR URETERAL STENT LEFT NEW ACCESS W/O SEP NEPHROSTOMY CATH  09/26/2019  . LEFT HEART CATH AND CORONARY ANGIOGRAPHY N/A 03/12/2019   Procedure: LEFT HEART CATH AND CORONARY ANGIOGRAPHY;  Surgeon: Belva Crome, MD;  Location: [facility];  Service: Cardiovascular;  Laterality: N/A;  . LITHOTRIPSY Right   . NEPHROLITHOTOMY Left 09/26/2019   Procedure: LEFT NEPHROLITHOTOMY PERCUTANEOUS;  Surgeon: Irine Seal, MD;  Location: [facility];  Service: Urology;  Laterality: Left;   No Known Allergies No current facility-administered medications on file prior to encounter.   Current Outpatient Medications on File Prior to Encounter  Medication Sig Dispense Refill  . Alcohol Swabs (B-D SINGLE USE SWABS REGULAR) PADS 1 each by Does not apply route as directed. 100 each 0  . allopurinol (ZYLOPRIM) 100 MG tablet Take 100 mg by mouth daily.     Marland Kitchen aspirin 81 MG chewable tablet Chew 1 tablet (81 mg total) by mouth daily. 90 tablet 1  . atorvastatin (LIPITOR) 80 MG tablet TAKE 1 TABLET BY MOUTH ONCE DAILY AT  6  PM 90 tablet 0  .  BIDIL 20-37.5 MG tablet Take 1 tablet by mouth twice daily 180 tablet 3  . blood glucose meter kit and supplies Dispense based on patient and insurance preference. Use up to four times daily as directed. (FOR ICD-10 E10.9, E11.9). 1 each 0  . BRILINTA 90 MG TABS tablet Take 1 tablet by mouth twice daily 180 tablet 0  . carvedilol (COREG) 12.5 MG tablet TAKE 1 TABLET BY MOUTH TWICE DAILY WITH MEALS 180 tablet 3  . furosemide (LASIX) 40 MG tablet TAKE 1 TABLET BY MOUTH ONCE DAILY AS NEEDED FOR FLUID 90 tablet 0  . HYDROcodone-acetaminophen (NORCO) 10-325 MG tablet Take 1 tablet by  mouth 2 (two) times daily as needed. 30 tablet 0  . ivabradine (CORLANOR) 5 MG TABS tablet Take 1 tablet (5 mg total) by mouth 2 (two) times daily with a meal. 60 tablet 11  . nitroGLYCERIN (NITROSTAT) 0.4 MG SL tablet Place 1 tablet (0.4 mg total) under the tongue every 5 (five) minutes as needed. (Patient not taking: Reported on 02/15/2020) 25 tablet 2  . potassium citrate (UROCIT-K) 10 MEQ (1080 MG) SR tablet Take 1 tablet by mouth daily.    . sodium bicarbonate 650 MG tablet Take 650 mg by mouth 3 (three) times daily.    . TRUEplus Lancets 30G MISC Use as directed to check blood sugar daily. 100 each 1   Social History   Socioeconomic History  . Marital status: Single    Spouse name: Not on file  . Number of children: 0  . Years of education: Not on file  . Highest education level: Some college, no degree  Occupational History  . Occupation: retired  Tobacco Use  . Smoking status: Former Smoker    Packs/day: 0.50    Years: 40.00    Pack years: 20.00    Types: Cigarettes    Quit date: 03/12/2019    Years since quitting: 1.1  . Smokeless tobacco: Never Used  Vaping Use  . Vaping Use: Never used  Substance and Sexual Activity  . Alcohol use: No  . Drug use: No  . Sexual activity: Not Currently  Other Topics Concern  . Not on file  Social History Narrative   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       Enjoys: fishing, shopping, working around the house       Diet: working on changes, avoiding fried foods, increased veggies   Caffeine: teas some daily   Water: 6-8 cups daily       Wears seat belt    Does not use phone while driving    Oceanographer at home    Social Determinants of Health   Financial Resource Strain: Low Risk   . Difficulty of Paying Living Expenses: Not hard at all  Food Insecurity: No Food Insecurity  . Worried About Charity fundraiser in the Last Year: Never true  . Ran Out of Food in the Last Year: Never true  Transportation Needs: No  Transportation Needs  . Lack of Transportation (Medical): No  . Lack of Transportation (Non-Medical): No  Physical Activity: Insufficiently Active  . Days of Exercise per Week: 7 days  . Minutes of Exercise per Session: 10 min  Stress: No Stress Concern Present  . Feeling of Stress : Not at all  Social Connections: Moderately Isolated  . Frequency of Communication with Friends and Family: More than three times a week  . Frequency of Social Gatherings with Friends and Family: More  than three times a week  . Attends Religious Services: More than 4 times per year  . Active Member of Clubs or Organizations: No  . Attends Archivist Meetings: Never  . Marital Status: Never married  Intimate Partner Violence: Not At Risk  . Fear of Current or Ex-Partner: No  . Emotionally Abused: No  . Physically Abused: No  . Sexually Abused: No   Family History  Problem Relation Age of Onset  . Heart failure Mother   . Heart disease Mother   . Cancer Mother        breast  . Heart attack Father   . Hypertension Father   . Heart disease Father   . Diabetes Brother     OBJECTIVE:  Vitals:   04/26/20 1055 04/26/20 1057  BP:  (!) 135/91  Pulse:  66  Resp:  19  Temp:  97.7 F (36.5 C)  TempSrc:  Oral  SpO2:  98%  Weight: 201 lb (91.2 kg)   Height: 6' (1.829 m)     General appearance: ALERT; in no acute distress.  Head: NCAT, Nose: No rhinorrhea noted; Throat: Mildly erythematous, cobblestoning present, tonsils not erythematous or enlarged Ears: EACs clear, bilateral TMs pearly gray, visible cone of light, no effusions noted, no erythema Neck: No lymphadenopathy Lungs: Normal respiratory effort CV:  pulses 2+ bilaterally. Cap refill < 2 seconds Lungs: CTAB, mild cough present Musculoskeletal:  Inspection: Skin warm, dry, clear and intact.  PIP joint of left great toe erythematous and swollen Palpation: Left great toe tender to palpation ROM: Limited ROM active and passive to  left foot, using cane today Skin: warm and dry Neurologic: Ambulates without difficulty; Sensation intact about the upper/ lower extremities Psychological: alert and cooperative; normal mood and affect  DIAGNOSTIC STUDIES:  No results found.   ASSESSMENT & PLAN:  1. Viral illness   2. Cough   3. Acute gout involving toe of left foot, unspecified cause   4. Nasal congestion   5. Great toe pain, left      Meds ordered this encounter  Medications  . dexamethasone (DECADRON) injection 10 mg  . predniSONE (DELTASONE) 20 MG tablet    Sig: Take 1 tablet (20 mg total) by mouth daily with breakfast for 5 days.    Dispense:  5 tablet    Refill:  0    Order Specific Question:   Supervising Provider    Answer:   Chase Picket [id number]  . promethazine-dextromethorphan (PROMETHAZINE-DM) 6.25-15 MG/5ML syrup    Sig: Take 5 mLs by mouth 4 (four) times daily as needed for cough.    Dispense:  118 mL    Refill:  0    Order Specific Question:   Supervising Provider    Answer:   Chase Picket [[id number]]   Gout Decadron 66m IM in office today Prescribed steroids Continue conservative management of rest, ice, and gentle stretches Take tylenol as needed for pain relief   Viral Illness Cough Nasal Congestion Prescribed promethazine syrup Covid test pending Quarantine until results are back and negative If results are positive, quarantine for 10 days from today  Follow up with PCP if symptoms persist Return or go to the ER if you have any new or worsening symptoms (fever, chills, chest pain, abdominal pain, changes in bowel or bladder habits, pain radiating into lower legs)   Reviewed expectations re: course of current medical issues. Questions answered. Outlined signs and symptoms indicating need for more acute intervention.  Patient verbalized understanding. After Visit Summary given.       Faustino Congress, NP 04/28/20 1849

## 2020-04-29 LAB — NOVEL CORONAVIRUS, NAA: SARS-CoV-2, NAA: NOT DETECTED

## 2020-05-01 ENCOUNTER — Other Ambulatory Visit: Payer: Self-pay | Admitting: Family Medicine

## 2020-05-01 ENCOUNTER — Ambulatory Visit: Payer: Medicare HMO | Admitting: Physical Medicine and Rehabilitation

## 2020-05-01 ENCOUNTER — Other Ambulatory Visit: Payer: Self-pay | Admitting: Cardiology

## 2020-05-15 ENCOUNTER — Other Ambulatory Visit: Payer: Self-pay | Admitting: Nephrology

## 2020-05-15 ENCOUNTER — Other Ambulatory Visit (HOSPITAL_COMMUNITY): Payer: Self-pay | Admitting: Nephrology

## 2020-05-15 DIAGNOSIS — I129 Hypertensive chronic kidney disease with stage 1 through stage 4 chronic kidney disease, or unspecified chronic kidney disease: Secondary | ICD-10-CM

## 2020-05-15 DIAGNOSIS — N170 Acute kidney failure with tubular necrosis: Secondary | ICD-10-CM

## 2020-05-15 DIAGNOSIS — N184 Chronic kidney disease, stage 4 (severe): Secondary | ICD-10-CM

## 2020-05-15 DIAGNOSIS — R809 Proteinuria, unspecified: Secondary | ICD-10-CM

## 2020-05-16 ENCOUNTER — Encounter
Payer: Medicare Other | Attending: Physical Medicine and Rehabilitation | Admitting: Physical Medicine and Rehabilitation

## 2020-05-16 ENCOUNTER — Encounter: Payer: Self-pay | Admitting: Physical Medicine and Rehabilitation

## 2020-05-16 ENCOUNTER — Other Ambulatory Visit: Payer: Self-pay

## 2020-05-16 VITALS — BP 131/86 | HR 66 | Temp 98.3°F | Ht 72.0 in | Wt 200.0 lb

## 2020-05-16 DIAGNOSIS — Z79891 Long term (current) use of opiate analgesic: Secondary | ICD-10-CM

## 2020-05-16 DIAGNOSIS — Z5181 Encounter for therapeutic drug level monitoring: Secondary | ICD-10-CM

## 2020-05-16 DIAGNOSIS — G894 Chronic pain syndrome: Secondary | ICD-10-CM | POA: Insufficient documentation

## 2020-05-16 MED ORDER — GABAPENTIN 100 MG PO CAPS: 100.0000 mg | ORAL_CAPSULE | Freq: Three times a day (TID) | ORAL | 0 refills | Status: DC

## 2020-05-16 NOTE — Progress Notes (Signed)
Subjective:     Patient ID: [name], male   DOB: February 18, 1956, 65 y.o.   MRN: [id number]  HPI  [name] is a 65 year old man who presents to establish care for bilateral knee OA.   He has bilateral knee pain, 10/10, that is sharp, stabbing, and aching.   She used to walk a lot in his work.   He has had injections in his bilateral knees with steroid and Monovisc without benefit.  He has tried Ibuprofen and Meloxicam without benefit.   He is unable to get knee replacements given cardiac risk factors- wears a Lifevest and has ICD- has been doing well in this regard.   As per patient he was referred here by Dr. Ninfa [name] as [name] is no longer providing benefit and Dr. Ninfa [name] felt Oxycodone may be warranted and should be managed by pain clinic.   Pain Inventory Average Pain 10 Pain Right Now 10 My pain is sharp, stabbing and aching  In the last 24 hours, has pain interfered with the following? General activity 0 Relation with others 0 Enjoyment of life 0 What TIME of day is your pain at its worst? night Sleep (in general) Poor  Pain is worse with: walking Pain improves with: medication and TENS Relief from Meds: 10  walk with assistance use a cane ability to climb steps?  no do you drive?  yes needs help with transfers  not employed: date last employed .  trouble walking  new  new    Family History  Problem Relation Age of Onset  . Heart failure Mother   . Heart disease Mother   . Cancer Mother        breast  . Heart attack Father   . Hypertension Father   . Heart disease Father   . Diabetes Brother    Social History   Socioeconomic History  . Marital status: Single    Spouse name: Not on file  . Number of children: 0  . Years of education: Not on file  . Highest education level: Some college, no degree  Occupational History  . Occupation: retired  Tobacco Use  . Smoking status: Former Smoker    Packs/day: 0.50    Years: 40.00     Pack years: 20.00    Types: Cigarettes    Quit date: 03/12/2019    Years since quitting: 1.1  . Smokeless tobacco: Never Used  Vaping Use  . Vaping Use: Never used  Substance and Sexual Activity  . Alcohol use: No  . Drug use: No  . Sexual activity: Not Currently  Other Topics Concern  . Not on file  Social History Narrative   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       Enjoys: fishing, shopping, working around the house       Diet: working on changes, avoiding fried foods, increased veggies   Caffeine: teas some daily   Water: 6-8 cups daily       Wears seat belt    Does not use phone while driving    Oceanographer at home    Social Determinants of Health   Financial Resource Strain: Low Risk   . Difficulty of Paying Living Expenses: Not hard at all  Food Insecurity: No Food Insecurity  . Worried About Charity fundraiser in the Last Year: Never true  . Ran Out of Food in the Last Year: Never true  Transportation Needs: No Transportation Needs  . Lack  of Transportation (Medical): No  . Lack of Transportation (Non-Medical): No  Physical Activity: Insufficiently Active  . Days of Exercise per Week: 7 days  . Minutes of Exercise per Session: 10 min  Stress: No Stress Concern Present  . Feeling of Stress : Not at all  Social Connections: Moderately Isolated  . Frequency of Communication with Friends and Family: More than three times a week  . Frequency of Social Gatherings with Friends and Family: More than three times a week  . Attends Religious Services: More than 4 times per year  . Active Member of Clubs or Organizations: No  . Attends Archivist Meetings: Never  . Marital Status: Never married   Past Surgical History:  Procedure Laterality Date  . AMPUTATION TOE Left 1991   2 digit of left foot  . CHOLECYSTECTOMY    . COLONOSCOPY    . CORONARY/GRAFT ACUTE MI REVASCULARIZATION N/A 03/12/2019   Procedure: Coronary/Graft Acute MI Revascularization;   Surgeon: Belva Crome, MD;  Location: [place] [facility];  Service: Cardiovascular;  Laterality: N/A;  . ICD IMPLANT N/A 08/24/2019   Procedure: ICD IMPLANT;  Surgeon: Evans Lance, MD;  Location: [place] [facility];  Service: Cardiovascular;  Laterality: N/A;  . IR URETERAL STENT LEFT NEW ACCESS W/O SEP NEPHROSTOMY CATH  09/26/2019  . LEFT HEART CATH AND CORONARY ANGIOGRAPHY N/A 03/12/2019   Procedure: LEFT HEART CATH AND CORONARY ANGIOGRAPHY;  Surgeon: Belva Crome, MD;  Location: [place] [facility];  Service: Cardiovascular;  Laterality: N/A;  . LITHOTRIPSY Right   . NEPHROLITHOTOMY Left 09/26/2019   Procedure: LEFT NEPHROLITHOTOMY PERCUTANEOUS;  Surgeon: Irine Seal, MD;  Location: [facility];  Service: Urology;  Laterality: Left;   Past Medical History:  Diagnosis Date  . Acute ST elevation myocardial infarction (STEMI) due to occlusion of mid portion of left anterior descending (LAD) coronary artery ([place]) 03/12/2019  . Acute systolic heart failure ([place])   . Arthritis   . CHF (congestive heart failure) ([place])   . CKD (chronic kidney disease) stage 4, GFR 15-29 ml/min (HCC)   . Dyspnea    with exertionm periodically  . Heart attack ([place]) 03/15/2019   anterior MI   . History of kidney stones   . Hyperparathyroidism ([place])   . Hypertension 2002  . Ischemic cardiomyopathy   . Left knee pain   . Syncope 04/04/2019   after blood draw  . Vitamin D deficiency    BP 131/86   Pulse 66   Temp 98.3 F (36.8 C)   Ht 6' (1.829 m)   Wt 200 lb (90.7 kg)   SpO2 96%   BMI 27.12 kg/m   Opioid Risk Score:   Fall Risk Score:  `1  Depression screen PHQ 2/9  Depression screen [facility] 2/9 05/16/2020 12/27/2019 12/27/2019 07/27/2019 06/19/2019 04/11/2019 03/21/2019  Decreased Interest 0 0 0 0 0 0 0  Down, Depressed, Hopeless 0 0 0 0 0 0 0  PHQ - 2 Score 0 0 0 0 0 0 0  Altered sleeping 0 - - - 0 - -  Tired, decreased energy 0 - - - 0 - -  Change in appetite 0 - - - 0 - -  Feeling bad or failure  about yourself  0 - - - 0 - -  Trouble concentrating 0 - - - 0 - -  Moving slowly or fidgety/restless 0 - - - 0 - -  Suicidal thoughts 0 - - - 0 - -  PHQ-9 Score 0 - - - 0 - -    Review of Systems  Constitutional: Negative.   HENT: Negative.   Eyes: Negative.   Respiratory: Negative.   Cardiovascular: Negative.   Gastrointestinal: Negative.   Endocrine: Negative.   Genitourinary: Negative.   Musculoskeletal: Positive for arthralgias.  Skin: Negative.   Allergic/Immunologic: Negative.   Hematological: Negative.   Psychiatric/Behavioral: Negative.   All other systems reviewed and are negative.      Objective:   Physical Exam Gen: no distress, normal appearing HEENT: oral mucosa pink and moist, NCAT Cardio: Reg rate Chest: normal effort, normal rate of breathing Abd: soft, non-distended Ext: no edema Psych: pleasant, normal affect Skin: intact Neuro: Alert and oriented x3. Ambulating in office with antalgic gait. Tender to palpation in bilateral knee joint medial to lateral with bony protrusions. +genu valgum, left>right Musculoskeletal:    Assessment:         Plan:     [name] is 54 man who presents to establish care for bilateral knee OA.  1) Chronic Pain Syndrome secondary to bilateral knee OA: -Discussed current symptoms of pain and history of pain.  -XRs reviewed and show moderate tricompartmental arthritis.  -UDS obtained today -Discussed risks and benefits of opioids medication -Provided with handicap placard given antalgic gait and limited mobility with cane -Discussed benefits of exercise in reducing pain. -Discussed following foods that may reduce pain: 1) Ginger 2) Blueberries 3) Salmon 4) Pumpkin seeds 5) dark chocolate 6) turmeric 7) tart cherries 8) virgin olive oil 9) chilli peppers 10) mint  Turmeric to reduce inflammation-can be used in cooking or taken as a supplement.  Benefits of turmeric:  -Highly  anti-inflammatory  -Increases antioxidants  -Improves memory, attention, brain disease  -Lowers risk of heart disease  -May help prevent cancer  -Decreases pain  -Alleviates depression  -Delays aging and decreases risk of chronic disease  -Consume with black pepper to increase absorption   Turmeric Milk Recipe:  1 cup milk  1 tsp turmeric  1 tsp cinnamon  1 tsp grated ginger (optional)  Black pepper (boosts the anti-inflammatory properties of turmeric).  1 tsp honey

## 2020-05-16 NOTE — Patient Instructions (Signed)
-  Discussed following foods that may reduce pain: 1) Ginger 2) Blueberries 3) Salmon 4) Pumpkin seeds 5) dark chocolate 6) turmeric 7) tart cherries 8) virgin olive oil 9) chilli peppers 10) mint  Turmeric to reduce inflammation-can be used in cooking or taken as a supplement.  Benefits of turmeric:  -Highly anti-inflammatory  -Increases antioxidants  -Improves memory, attention, brain disease  -Lowers risk of heart disease  -May help prevent cancer  -Decreases pain  -Alleviates depression  -Delays aging and decreases risk of chronic disease  -Consume with black pepper to increase absorption    Turmeric Milk Recipe:  1 cup milk  1 tsp turmeric  1 tsp cinnamon  1 tsp grated ginger (optional)  Black pepper (boosts the anti-inflammatory properties of turmeric).  1 tsp honey

## 2020-05-21 LAB — DRUG TOX MONITOR 1 W/CONF, ORAL FLD
Amphetamines: NEGATIVE ng/mL (ref <10)
Barbiturates: NEGATIVE ng/mL (ref <10)
Benzodiazepines: NEGATIVE ng/mL (ref <0.50)
Benzoylecgonine: >250 ng/mL — ABNORMAL HIGH (ref <5.0)
Buprenorphine: NEGATIVE ng/mL (ref <0.10)
Cocaine: NEGATIVE ng/mL (ref <5.0)
Cocaine: POSITIVE ng/mL — AB (ref <5.0)
Fentanyl: NEGATIVE ng/mL (ref <0.10)
Heroin Metabolite: NEGATIVE ng/mL (ref <1.0)
MARIJUANA: NEGATIVE ng/mL (ref <2.5)
MDMA: NEGATIVE ng/mL (ref <10)
Meprobamate: NEGATIVE ng/mL (ref <2.5)
Methadone: NEGATIVE ng/mL (ref <5.0)
Nicotine Metabolite: NEGATIVE ng/mL (ref <5.0)
Opiates: NEGATIVE ng/mL (ref <2.5)
Phencyclidine: NEGATIVE ng/mL (ref <10)
Tapentadol: NEGATIVE ng/mL (ref <5.0)
Tramadol: NEGATIVE ng/mL (ref <5.0)
Zolpidem: NEGATIVE ng/mL (ref <5.0)

## 2020-05-21 LAB — DRUG TOX ALC METAB W/CON, ORAL FLD: Alcohol Metabolite: NEGATIVE ng/mL (ref <25)

## 2020-05-22 ENCOUNTER — Telehealth: Payer: Self-pay | Admitting: *Deleted

## 2020-05-22 ENCOUNTER — Other Ambulatory Visit: Payer: Self-pay | Admitting: Physical Medicine and Rehabilitation

## 2020-05-22 MED ORDER — GABAPENTIN 300 MG PO CAPS: 300.0000 mg | ORAL_CAPSULE | Freq: Three times a day (TID) | ORAL | 2 refills | Status: DC

## 2020-05-22 NOTE — Telephone Encounter (Signed)
[name] called to let Dr Ranell Patrick know that he would like an increase in his gabapentin. The 100 mg is "not doing nothing". I let him know she was out of the office but I would send a message to her inbox. Please advise.

## 2020-05-22 NOTE — Telephone Encounter (Signed)
Please let him know I have sent '300mg'$  three times per day of the Gabapentin. Please also let him know that his urine sample was positive for cocaine so we will be unable to prescribe controlled substances for him but can help with non-opioid pain management.

## 2020-05-23 ENCOUNTER — Encounter (HOSPITAL_COMMUNITY): Payer: Self-pay

## 2020-05-23 ENCOUNTER — Ambulatory Visit (HOSPITAL_COMMUNITY): Payer: Medicare Other

## 2020-05-23 ENCOUNTER — Other Ambulatory Visit: Payer: Self-pay | Admitting: *Deleted

## 2020-05-23 MED ORDER — GABAPENTIN 300 MG PO CAPS: 300.0000 mg | ORAL_CAPSULE | Freq: Three times a day (TID) | ORAL | 2 refills | Status: DC

## 2020-05-23 NOTE — Telephone Encounter (Signed)
[name] asked that his gabapentin be sent to [facility].

## 2020-05-23 NOTE — Telephone Encounter (Signed)
Done

## 2020-05-24 ENCOUNTER — Ambulatory Visit (INDEPENDENT_AMBULATORY_CARE_PROVIDER_SITE_OTHER): Payer: Medicare Other

## 2020-05-24 DIAGNOSIS — I255 Ischemic cardiomyopathy: Secondary | ICD-10-CM | POA: Diagnosis not present

## 2020-05-24 LAB — CUP PACEART REMOTE DEVICE CHECK
Battery Remaining Longevity: 180 mo
Battery Remaining Percentage: 100 %
Brady Statistic RV Percent Paced: 0 %
Date Time Interrogation Session: 20220121053200
HighPow Impedance: 67 Ohm
Implantable Lead Implant Date: 20210422
Implantable Lead Location: 753860
Implantable Lead Model: 138
Implantable Lead Serial Number: 302700
Implantable Pulse Generator Implant Date: 20210422
Lead Channel Impedance Value: 425 Ohm
Lead Channel Setting Pacing Amplitude: 2.5 V
Lead Channel Setting Pacing Pulse Width: 0.4 ms
Lead Channel Setting Sensing Sensitivity: 0.5 mV
Pulse Gen Serial Number: 209495

## 2020-05-27 ENCOUNTER — Telehealth: Payer: Self-pay | Admitting: *Deleted

## 2020-05-27 NOTE — Telephone Encounter (Signed)
Oral swab drug test positive for Cocaine and its metabolite.

## 2020-06-03 ENCOUNTER — Other Ambulatory Visit (HOSPITAL_COMMUNITY): Payer: Self-pay

## 2020-06-03 MED ORDER — IVABRADINE HCL 5 MG PO TABS: 5.0000 mg | ORAL_TABLET | Freq: Two times a day (BID) | ORAL | 3 refills | Status: DC

## 2020-06-04 DIAGNOSIS — N170 Acute kidney failure with tubular necrosis: Secondary | ICD-10-CM | POA: Diagnosis not present

## 2020-06-04 DIAGNOSIS — N184 Chronic kidney disease, stage 4 (severe): Secondary | ICD-10-CM | POA: Diagnosis not present

## 2020-06-04 DIAGNOSIS — R809 Proteinuria, unspecified: Secondary | ICD-10-CM | POA: Diagnosis not present

## 2020-06-04 DIAGNOSIS — I129 Hypertensive chronic kidney disease with stage 1 through stage 4 chronic kidney disease, or unspecified chronic kidney disease: Secondary | ICD-10-CM | POA: Diagnosis not present

## 2020-06-05 ENCOUNTER — Other Ambulatory Visit: Payer: Self-pay

## 2020-06-05 ENCOUNTER — Ambulatory Visit (HOSPITAL_COMMUNITY)
Admission: RE | Admit: 2020-06-05 | Discharge: 2020-06-05 | Disposition: A | Discharge status: Home / Self Care | Payer: Medicare Other | Source: Ambulatory Visit | Attending: Nephrology | Admitting: Nephrology

## 2020-06-05 DIAGNOSIS — N184 Chronic kidney disease, stage 4 (severe): Secondary | ICD-10-CM | POA: Diagnosis not present

## 2020-06-05 DIAGNOSIS — D7389 Other diseases of spleen: Secondary | ICD-10-CM | POA: Diagnosis not present

## 2020-06-05 DIAGNOSIS — I129 Hypertensive chronic kidney disease with stage 1 through stage 4 chronic kidney disease, or unspecified chronic kidney disease: Secondary | ICD-10-CM | POA: Diagnosis not present

## 2020-06-05 DIAGNOSIS — R809 Proteinuria, unspecified: Secondary | ICD-10-CM

## 2020-06-05 DIAGNOSIS — N200 Calculus of kidney: Secondary | ICD-10-CM | POA: Diagnosis not present

## 2020-06-05 DIAGNOSIS — N170 Acute kidney failure with tubular necrosis: Secondary | ICD-10-CM | POA: Diagnosis not present

## 2020-06-05 NOTE — Progress Notes (Signed)
Remote ICD transmission.   

## 2020-06-06 ENCOUNTER — Telehealth: Payer: Self-pay

## 2020-06-06 NOTE — Telephone Encounter (Signed)
ptn called to find out if handicap form has a charge - advised no

## 2020-06-10 ENCOUNTER — Telehealth (HOSPITAL_COMMUNITY): Payer: Self-pay | Admitting: Pharmacy Technician

## 2020-06-10 NOTE — Telephone Encounter (Signed)
Patient Advocate Encounter   Received notification from [place] that prior authorization for Corlanor is required.   PA submitted on CoverMyMeds Key [id number] Status is pending   Will continue to follow.

## 2020-06-11 ENCOUNTER — Telehealth: Payer: Self-pay

## 2020-06-11 NOTE — Telephone Encounter (Signed)
Advanced Heart Failure Patient Advocate Encounter  Prior Authorization for Corlanor has been approved.    PA#  [id number] Effective dates: 06/10/20 through 05/03/21  Patients co-pay is $0  Charlann Boxer, CPhT

## 2020-06-11 NOTE — Telephone Encounter (Signed)
Gabapentin 300 mg not working. Not appt 06/18/2020.  [facility].

## 2020-06-11 NOTE — Telephone Encounter (Signed)
I have messaged [name] to see if he would like to do an 11:20 phone visit today to discuss other options for his pain

## 2020-06-12 ENCOUNTER — Other Ambulatory Visit: Payer: Self-pay | Admitting: Physical Medicine and Rehabilitation

## 2020-06-12 ENCOUNTER — Telehealth: Payer: Self-pay | Admitting: Physical Medicine and Rehabilitation

## 2020-06-12 MED ORDER — GABAPENTIN 400 MG PO CAPS: 400.0000 mg | ORAL_CAPSULE | Freq: Three times a day (TID) | ORAL | 2 refills | Status: DC

## 2020-06-12 NOTE — Telephone Encounter (Signed)
Called pt to notify of the Gabapentin and he states he wanted it sent to [facility] instead of [facility]. Called [facility] and spoke to [place] to cancel Rx. Called into [place].

## 2020-06-12 NOTE — Telephone Encounter (Signed)
Patient left a message a couple of days ago about Gabapentin not working for him.  [name] has sent a message to make an appt with you sooner, however, I offered him an appt with you this Friday, but he can't do that day.  He has an appt with you on Tuesday.  He also did say that you asked him to call you if medication wasn't working.  Please advise.

## 2020-06-12 NOTE — Telephone Encounter (Signed)
Please let him know I have sent him a higher dose and to let me know if this doesn't help. Thank you!

## 2020-06-13 DIAGNOSIS — H40023 Open angle with borderline findings, high risk, bilateral: Secondary | ICD-10-CM | POA: Diagnosis not present

## 2020-06-13 DIAGNOSIS — H52203 Unspecified astigmatism, bilateral: Secondary | ICD-10-CM | POA: Diagnosis not present

## 2020-06-13 DIAGNOSIS — H5213 Myopia, bilateral: Secondary | ICD-10-CM | POA: Diagnosis not present

## 2020-06-14 DIAGNOSIS — N281 Cyst of kidney, acquired: Secondary | ICD-10-CM | POA: Diagnosis not present

## 2020-06-14 DIAGNOSIS — N184 Chronic kidney disease, stage 4 (severe): Secondary | ICD-10-CM | POA: Diagnosis not present

## 2020-06-14 DIAGNOSIS — I129 Hypertensive chronic kidney disease with stage 1 through stage 4 chronic kidney disease, or unspecified chronic kidney disease: Secondary | ICD-10-CM | POA: Diagnosis not present

## 2020-06-14 DIAGNOSIS — E210 Primary hyperparathyroidism: Secondary | ICD-10-CM | POA: Diagnosis not present

## 2020-06-14 DIAGNOSIS — D7389 Other diseases of spleen: Secondary | ICD-10-CM | POA: Diagnosis not present

## 2020-06-16 NOTE — Progress Notes (Signed)
ADVANCED HF CLINIC  NOTE  Primary Cardiologist: DR. Burt Knack Primary Care: Dr. Orson Ape AHF: Dr. Haroldine Laws   HPI: 65 y/o male, former smoker with HTN, CKD IV, CAD and systolic HF. Experienced a large anterolateral STEMI on 03/12/19. Has [employer] ICD.   Cath 03/12/2019  Total occlusion of the mid LAD treated with angioplasty and stenting using a 4.0 x 22 Onyx deployed at 14 atm. 0% stenosis was noted post procedure and TIMI grade III flow increased from 0 to 3. The final angiographic images of the LAD demonstrates apical occlusion due to embolus.  Luminal irregularities in the left main but no agreeable stenosis.  Large circumflex with 3 obtuse marginals. The first marginal is large and has a very distal 90% stenosis in 2 branches. There is a trifurcation distally in the first marginal.  RCA Moderate diffuse luminal irregularities but widely patent.  Mid anterior wall to apical akinesis. EF 30 to 35% acutely. LVEDP 31 mmHg.   Post cath had low BP and shock but recovered. EF 20% by echo. No significant MR. Creatinine post cath 3.1-3.4. Now followed by nephrology. Has been taking Lasix 40 mg daily.    Echo 3/21. EF 20-25%. RV normal.  S/P [employer] ICD implant 08/2019   Admitted 09/26/19 with large left renal stone. S/P left nephrolithotomy. Discharged  09/30/19.  He presents for routine f/u. Says he remains active. Walks 1/2 mile day. No CP. Mild DOE if he pushes. No edema, orthopnea or PND. No problems with meds. Pending L TKA    Review of systems complete and found to be negative unless listed in HPI.     Past Medical History:  Diagnosis Date  . Acute ST elevation myocardial infarction (STEMI) due to occlusion of mid portion of left anterior descending (LAD) coronary artery ([place]) 03/12/2019  . Acute systolic heart failure ([place])   . Arthritis   . CHF (congestive heart failure) ([place])   . CKD (chronic kidney disease) stage 4, GFR 15-29 ml/min (HCC)   .  Dyspnea    with exertionm periodically  . Heart attack ([place]) 03/15/2019   anterior MI   . History of kidney stones   . Hyperparathyroidism ([place])   . Hypertension 2002  . Ischemic cardiomyopathy   . Left knee pain   . Syncope 04/04/2019   after blood draw  . Vitamin D deficiency     Current Outpatient Medications  Medication Sig Dispense Refill  . Alcohol Swabs (B-D SINGLE USE SWABS REGULAR) PADS USE AS DIRECTED 100 each 0  . allopurinol (ZYLOPRIM) 100 MG tablet Take 100 mg by mouth daily.     Marland Kitchen aspirin 81 MG chewable tablet Chew 1 tablet (81 mg total) by mouth daily. 90 tablet 1  . atorvastatin (LIPITOR) 80 MG tablet TAKE 1 TABLET BY MOUTH ONCE DAILY AT  6  PM 90 tablet 0  . BIDIL 20-37.5 MG tablet Take 1 tablet by mouth twice daily 180 tablet 3  . blood glucose meter kit and supplies Dispense based on patient and insurance preference. Use up to four times daily as directed. (FOR ICD-10 E10.9, E11.9). 1 each 0  . BRILINTA 90 MG TABS tablet Take 1 tablet by mouth twice daily 180 tablet 0  . carvedilol (COREG) 12.5 MG tablet TAKE 1 TABLET BY MOUTH TWICE DAILY WITH MEALS 180 tablet 3  . furosemide (LASIX) 40 MG tablet TAKE 1 TABLET BY MOUTH ONCE DAILY AS NEEDED FOR FLUID 90 tablet 0  . gabapentin (NEURONTIN)  400 MG capsule Take 1 capsule (400 mg total) by mouth 3 (three) times daily. 90 capsule 2  . HYDROcodone-acetaminophen (NORCO) 10-325 MG tablet Take 1 tablet by mouth 2 (two) times daily as needed. 30 tablet 0  . ivabradine (CORLANOR) 5 MG TABS tablet Take 1 tablet (5 mg total) by mouth 2 (two) times daily with a meal. 180 tablet 3  . nitroGLYCERIN (NITROSTAT) 0.4 MG SL tablet Place 1 tablet (0.4 mg total) under the tongue every 5 (five) minutes as needed. 25 tablet 2  . potassium citrate (UROCIT-K) 10 MEQ (1080 MG) SR tablet Take 1 tablet by mouth daily.    . promethazine-dextromethorphan (PROMETHAZINE-DM) 6.25-15 MG/5ML syrup Take 5 mLs by mouth 4 (four) times daily as needed for  cough. 118 mL 0  . sodium bicarbonate 650 MG tablet Take 650 mg by mouth 3 (three) times daily.    . TRUEplus Lancets 30G MISC Use as directed to check blood sugar daily. 100 each 1   No current facility-administered medications for this encounter.    No Known Allergies    Social History   Socioeconomic History  . Marital status: Single    Spouse name: Not on file  . Number of children: 0  . Years of education: Not on file  . Highest education level: Some college, no degree  Occupational History  . Occupation: retired  Tobacco Use  . Smoking status: Former Smoker    Packs/day: 0.50    Years: 40.00    Pack years: 20.00    Types: Cigarettes    Quit date: 03/12/2019    Years since quitting: 1.2  . Smokeless tobacco: Never Used  Vaping Use  . Vaping Use: Never used  Substance and Sexual Activity  . Alcohol use: No  . Drug use: No  . Sexual activity: Not Currently  Other Topics Concern  . Not on file  Social History Narrative   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       Enjoys: fishing, shopping, working around the house       Diet: working on changes, avoiding fried foods, increased veggies   Caffeine: teas some daily   Water: 6-8 cups daily       Wears seat belt    Does not use phone while driving    Oceanographer at home    Social Determinants of Health   Financial Resource Strain: Low Risk   . Difficulty of Paying Living Expenses: Not hard at all  Food Insecurity: No Food Insecurity  . Worried About Charity fundraiser in the Last Year: Never true  . Ran Out of Food in the Last Year: Never true  Transportation Needs: No Transportation Needs  . Lack of Transportation (Medical): No  . Lack of Transportation (Non-Medical): No  Physical Activity: Insufficiently Active  . Days of Exercise per Week: 7 days  . Minutes of Exercise per Session: 10 min  Stress: No Stress Concern Present  . Feeling of Stress : Not at all  Social Connections: Moderately Isolated   . Frequency of Communication with Friends and Family: More than three times a week  . Frequency of Social Gatherings with Friends and Family: More than three times a week  . Attends Religious Services: More than 4 times per year  . Active Member of Clubs or Organizations: No  . Attends Archivist Meetings: Never  . Marital Status: Never married  Intimate Partner Violence: Not At Risk  . Fear of  Current or Ex-Partner: No  . Emotionally Abused: No  . Physically Abused: No  . Sexually Abused: No      Family History  Problem Relation Age of Onset  . Heart failure Mother   . Heart disease Mother   . Cancer Mother        breast  . Heart attack Father   . Hypertension Father   . Heart disease Father   . Diabetes Brother     Vitals:   06/17/20 0852  BP: 132/80  Pulse: 61  SpO2: 99%  Weight: 92.3 kg (203 lb 6.4 oz)    Wt Readings from Last 3 Encounters:  05/16/20 90.7 kg (200 lb)  04/26/20 91.2 kg (201 lb)  02/15/20 91.3 kg (201 lb 3.2 oz)     PHYSICAL EXAM: General:  Well appearing. No resp difficulty HEENT: normal Neck: supple. no JVD. Carotids 2+ bilat; no bruits. No lymphadenopathy or thryomegaly appreciated. Cor: PMI nondisplaced. Regular rate & rhythm. No rubs, gallops or murmurs. Lungs: clear Abdomen: soft, nontender, nondistended. No hepatosplenomegaly. No bruits or masses. Good bowel sounds. Extremities: no cyanosis, clubbing, rash, edema Neuro: alert & orientedx3, cranial nerves grossly intact. moves all 4 extremities w/o difficulty. Affect pleasant   ASSESSMENT & PLAN:  1. CAD s/p acute anterolateral STEMI 03/12/19 - s/p DES LAD - No s/s angina - Continue DAPT w/ ASA + Brilinta. After 03/11/20, can reduce Brilinta to 60 mg bid  - continue atorvastatin 80 mg.   2. Chronic Systolic HF - EF 79%. Following anteriorlateral stemi - Echo 3/21: EF 20-25%. RV ok.  - Has [employer] ICD.  - NYHA II. Volume status stable.  - ICD interrogated  personally HL Score ). No VT/AF - Continue lasix 40 mg daily takes as needed (takes about 3x/week) - Increase Bidil from 1 BID to 1 TID  - Continue carvedilol 12.5 bid.  - Continue ivabradine 5 mg twice a day.  - Not candidate for Entresto or spiro with CKD IV. - Check labs today  3. CKD IV - due to HTN nephropathy - Last creatinine  (GFR 17) - Follows with Dr. Earl Lites - GFR too low for SGLT2i  4. HTN - well controlled on current regimen - do not want to drop BP too low with CKD IV  5. Tobacco use - quit after MI  - congratulated on cessation  6. Pre-op clearance for TKA - from cardiac perspective he is doing well. Moderate risk for peri-op CV complications but ok to proceed. With advanced renal disease likely needs clearance from his Nephrologist as well.   Glori Bickers, MD  11:58 AM

## 2020-06-17 ENCOUNTER — Encounter (HOSPITAL_COMMUNITY): Payer: Self-pay | Admitting: Internal Medicine

## 2020-06-17 ENCOUNTER — Other Ambulatory Visit: Payer: Self-pay

## 2020-06-17 ENCOUNTER — Ambulatory Visit (HOSPITAL_COMMUNITY)
Admission: RE | Admit: 2020-06-17 | Discharge: 2020-06-17 | Disposition: A | Discharge status: Home / Self Care | Payer: Medicare Other | Source: Ambulatory Visit | Attending: Internal Medicine | Admitting: Internal Medicine

## 2020-06-17 VITALS — BP 132/80 | HR 61 | Wt 203.4 lb

## 2020-06-17 DIAGNOSIS — I255 Ischemic cardiomyopathy: Secondary | ICD-10-CM | POA: Insufficient documentation

## 2020-06-17 DIAGNOSIS — Z7982 Long term (current) use of aspirin: Secondary | ICD-10-CM | POA: Insufficient documentation

## 2020-06-17 DIAGNOSIS — Z87891 Personal history of nicotine dependence: Secondary | ICD-10-CM | POA: Diagnosis not present

## 2020-06-17 DIAGNOSIS — Z95810 Presence of automatic (implantable) cardiac defibrillator: Secondary | ICD-10-CM | POA: Diagnosis not present

## 2020-06-17 DIAGNOSIS — I10 Essential (primary) hypertension: Secondary | ICD-10-CM | POA: Diagnosis not present

## 2020-06-17 DIAGNOSIS — I2510 Atherosclerotic heart disease of native coronary artery without angina pectoris: Secondary | ICD-10-CM | POA: Insufficient documentation

## 2020-06-17 DIAGNOSIS — Z8249 Family history of ischemic heart disease and other diseases of the circulatory system: Secondary | ICD-10-CM | POA: Diagnosis not present

## 2020-06-17 DIAGNOSIS — Z9861 Coronary angioplasty status: Secondary | ICD-10-CM

## 2020-06-17 DIAGNOSIS — I130 Hypertensive heart and chronic kidney disease with heart failure and stage 1 through stage 4 chronic kidney disease, or unspecified chronic kidney disease: Secondary | ICD-10-CM | POA: Diagnosis not present

## 2020-06-17 DIAGNOSIS — Z955 Presence of coronary angioplasty implant and graft: Secondary | ICD-10-CM | POA: Insufficient documentation

## 2020-06-17 DIAGNOSIS — N184 Chronic kidney disease, stage 4 (severe): Secondary | ICD-10-CM

## 2020-06-17 DIAGNOSIS — I5022 Chronic systolic (congestive) heart failure: Secondary | ICD-10-CM | POA: Diagnosis not present

## 2020-06-17 DIAGNOSIS — R0609 Other forms of dyspnea: Secondary | ICD-10-CM | POA: Diagnosis not present

## 2020-06-17 DIAGNOSIS — Z7901 Long term (current) use of anticoagulants: Secondary | ICD-10-CM | POA: Insufficient documentation

## 2020-06-17 DIAGNOSIS — Z79899 Other long term (current) drug therapy: Secondary | ICD-10-CM | POA: Diagnosis not present

## 2020-06-17 DIAGNOSIS — I252 Old myocardial infarction: Secondary | ICD-10-CM | POA: Insufficient documentation

## 2020-06-17 MED ORDER — BIDIL 20-37.5 MG PO TABS: 1.0000 | ORAL_TABLET | Freq: Three times a day (TID) | ORAL | 3 refills | Status: DC

## 2020-06-17 NOTE — Patient Instructions (Signed)
INCREASE Bidil to 1 tablet by mouth 3 times daily.  No other medication changes were made. Please continue all current medications as prescribed.  Your physician recommends that you schedule a follow-up appointment in: 6 months with an echo prior to your exam. Please contact our offfice in July to schedule an August appointment.   If you have any questions or concerns before your next appointment please send [place] a message through [place] or call our office at [phone].    TO LEAVE A MESSAGE FOR THE NURSE SELECT OPTION 2, PLEASE LEAVE A MESSAGE INCLUDING: . YOUR NAME . DATE OF BIRTH . CALL BACK NUMBER . REASON FOR CALL**this is important as we prioritize the call backs  YOU WILL RECEIVE A CALL BACK THE SAME DAY AS LONG AS YOU CALL BEFORE 4:00 PM   Do the following things EVERYDAY: 1) Weigh yourself in the morning before breakfast. Write it down and keep it in a log. 2) Take your medicines as prescribed 3) Eat low salt foods--Limit salt (sodium) to 2000 mg per day.  4) Stay as active as you can everyday 5) Limit all fluids for the day to less than 2 liters   At the [facility], you and your health needs are our priority. As part of our continuing mission to provide you with exceptional heart care, we have created designated Provider Care Teams. These Care Teams include your primary Cardiologist (physician) and Advanced Practice Providers (APPs- Physician Assistants and Nurse Practitioners) who all work together to provide you with the care you need, when you need it.   You may see any of the following providers on your designated Care Team at your next follow up: Marland Kitchen Dr Glori Bickers . Dr Loralie Champagne . Darrick Grinder, NP . Lyda Jester, PA . Audry Riles, PharmD   Please be sure to bring in all your medications bottles to every appointment.

## 2020-06-17 NOTE — Addendum Note (Signed)
Encounter addended by: Shonna Chock, CMA on: [id number] 9:20 AM  Actions taken: Order list changed, Clinical Note Signed, Follow-up modified

## 2020-06-17 NOTE — Addendum Note (Signed)
Encounter addended by: Shonna Chock, CMA on: [id number] 9:23 AM  Actions taken: Order list changed, Diagnosis association updated

## 2020-06-18 ENCOUNTER — Encounter: Payer: Self-pay | Admitting: Physical Medicine and Rehabilitation

## 2020-06-18 ENCOUNTER — Encounter
Payer: Medicare Other | Attending: Physical Medicine and Rehabilitation | Admitting: Physical Medicine and Rehabilitation

## 2020-06-18 VITALS — BP 140/87 | HR 62 | Temp 97.9°F | Ht 72.0 in | Wt 206.0 lb

## 2020-06-18 DIAGNOSIS — Z789 Other specified health status: Secondary | ICD-10-CM

## 2020-06-18 DIAGNOSIS — G894 Chronic pain syndrome: Secondary | ICD-10-CM

## 2020-06-18 DIAGNOSIS — M170 Bilateral primary osteoarthritis of knee: Secondary | ICD-10-CM | POA: Diagnosis not present

## 2020-06-18 DIAGNOSIS — N184 Chronic kidney disease, stage 4 (severe): Secondary | ICD-10-CM | POA: Diagnosis not present

## 2020-06-18 DIAGNOSIS — M179 Osteoarthritis of knee, unspecified: Secondary | ICD-10-CM | POA: Diagnosis not present

## 2020-06-18 DIAGNOSIS — Z7409 Other reduced mobility: Secondary | ICD-10-CM | POA: Insufficient documentation

## 2020-06-18 MED ORDER — MELOXICAM 15 MG PO TABS: 15.0000 mg | ORAL_TABLET | Freq: Every day | ORAL | 2 refills | Status: DC

## 2020-06-18 NOTE — Patient Instructions (Addendum)
Blue emu oil Icing three time per day for 15 minutes  -Discussed following foods that may reduce pain: 1) Ginger 2) Blueberries 3) Salmon 4) Pumpkin seeds 5) dark chocolate 6) turmeric 7) tart cherries 8) virgin olive oil 9) chilli peppers 10) mint  Link to further information on diet for chronic pain: http://www.randall.com/  Turmeric to reduce inflammation--can be used in cooking or taken as a supplement.  Benefits of turmeric:  -Highly anti-inflammatory  -Increases antioxidants  -Improves memory, attention, brain disease  -Lowers risk of heart disease  -May help prevent cancer  -Decreases pain  -Alleviates depression  -Delays aging and decreases risk of chronic disease  -Consume with black pepper to increase absorption    Turmeric Milk Recipe:  1 cup milk  1 tsp turmeric  1 tsp cinnamon  1 tsp grated ginger (optional)  Black pepper (boosts the anti-inflammatory properties of turmeric).  1 tsp honey

## 2020-06-18 NOTE — Progress Notes (Signed)
Subjective:     Patient ID: [name], male   DOB: 12-30-1955, 65 y.o.   MRN: [id number]  HPI  [name] is a 65 year old man who presents for follow-up of severe bilateral knee OA.   -He has bilateral knee pain, 10/10, that is sharp, stabbing, and aching.   -He used to walk a lot in his work.   -He has had injections in his bilateral knees with steroid and Monovisc without benefit. He has never tried oral steroids.   -He has tried Ibuprofen and Meloxicam without benefit. Discussed retrying this option with his cardiologist Dr. Haroldine Laws, but it is contraindicated given his stage 2 CKD  -Fortunately he was recently cleared to get a left knee replacement by Dr. Haroldine Laws at his appointment yesterday- wears a Lifevest and has ICD- has been doing well in this regard.   As per patient he was referred here by Dr. Ninfa Linden as [name] is no longer providing benefit and Dr. Ninfa Linden felt Oxycodone may be warranted and should be managed by pain clinic.   Pain Inventory Average Pain 9 Pain Right Now 10 My pain is constant and sharp  In the last 24 hours, has pain interfered with the following? General activity 8 Relation with others 8 Enjoyment of life 8 What TIME of day is your pain at its worst? morning  and night Sleep (in general) Poor  Pain is worse with: walking Pain improves with: medication Relief from Meds: 8        Family History  Problem Relation Age of Onset  . Heart failure Mother   . Heart disease Mother   . Cancer Mother        breast  . Heart attack Father   . Hypertension Father   . Heart disease Father   . Diabetes Brother    Social History   Socioeconomic History  . Marital status: Single    Spouse name: Not on file  . Number of children: 0  . Years of education: Not on file  . Highest education level: Some college, no degree  Occupational History  . Occupation: retired  Tobacco Use  . Smoking status: Former Smoker    Packs/day: 0.50     Years: 40.00    Pack years: 20.00    Types: Cigarettes    Quit date: 03/12/2019    Years since quitting: 1.2  . Smokeless tobacco: Never Used  Vaping Use  . Vaping Use: Never used  Substance and Sexual Activity  . Alcohol use: No  . Drug use: No  . Sexual activity: Not Currently  Other Topics Concern  . Not on file  Social History Narrative   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       Enjoys: fishing, shopping, working around the house       Diet: working on changes, avoiding fried foods, increased veggies   Caffeine: teas some daily   Water: 6-8 cups daily       Wears seat belt    Does not use phone while driving    Oceanographer at home    Social Determinants of Health   Financial Resource Strain: Low Risk   . Difficulty of Paying Living Expenses: Not hard at all  Food Insecurity: No Food Insecurity  . Worried About Charity fundraiser in the Last Year: Never true  . Ran Out of Food in the Last Year: Never true  Transportation Needs: No Transportation Needs  . Lack of  Transportation (Medical): No  . Lack of Transportation (Non-Medical): No  Physical Activity: Insufficiently Active  . Days of Exercise per Week: 7 days  . Minutes of Exercise per Session: 10 min  Stress: No Stress Concern Present  . Feeling of Stress : Not at all  Social Connections: Moderately Isolated  . Frequency of Communication with Friends and Family: More than three times a week  . Frequency of Social Gatherings with Friends and Family: More than three times a week  . Attends Religious Services: More than 4 times per year  . Active Member of Clubs or Organizations: No  . Attends Archivist Meetings: Never  . Marital Status: Never married   Past Surgical History:  Procedure Laterality Date  . AMPUTATION TOE Left 1991   2 digit of left foot  . CHOLECYSTECTOMY    . COLONOSCOPY    . CORONARY/GRAFT ACUTE MI REVASCULARIZATION N/A 03/12/2019   Procedure: Coronary/Graft Acute MI  Revascularization;  Surgeon: Belva Crome, MD;  Location: [facility];  Service: Cardiovascular;  Laterality: N/A;  . ICD IMPLANT N/A 08/24/2019   Procedure: ICD IMPLANT;  Surgeon: Evans Lance, MD;  Location: [facility];  Service: Cardiovascular;  Laterality: N/A;  . IR URETERAL STENT LEFT NEW ACCESS W/O SEP NEPHROSTOMY CATH  09/26/2019  . LEFT HEART CATH AND CORONARY ANGIOGRAPHY N/A 03/12/2019   Procedure: LEFT HEART CATH AND CORONARY ANGIOGRAPHY;  Surgeon: Belva Crome, MD;  Location: [facility];  Service: Cardiovascular;  Laterality: N/A;  . LITHOTRIPSY Right   . NEPHROLITHOTOMY Left 09/26/2019   Procedure: LEFT NEPHROLITHOTOMY PERCUTANEOUS;  Surgeon: Irine Seal, MD;  Location: [facility];  Service: Urology;  Laterality: Left;   Past Medical History:  Diagnosis Date  . Acute ST elevation myocardial infarction (STEMI) due to occlusion of mid portion of left anterior descending (LAD) coronary artery ([place]) 03/12/2019  . Acute systolic heart failure ([place])   . Arthritis   . CHF (congestive heart failure) ([place])   . CKD (chronic kidney disease) stage 4, GFR 15-29 ml/min (HCC)   . Dyspnea    with exertionm periodically  . Heart attack ([place]) 03/15/2019   anterior MI   . History of kidney stones   . Hyperparathyroidism ([place])   . Hypertension 2002  . Ischemic cardiomyopathy   . Left knee pain   . Syncope 04/04/2019   after blood draw  . Vitamin D deficiency    There were no vitals taken for this visit.  Opioid Risk Score:   Fall Risk Score:  `1  Depression screen PHQ 2/9  Depression screen [facility] 2/9 05/16/2020 12/27/2019 12/27/2019 07/27/2019 06/19/2019 04/11/2019 03/21/2019  Decreased Interest 0 0 0 0 0 0 0  Down, Depressed, Hopeless 0 0 0 0 0 0 0  PHQ - 2 Score 0 0 0 0 0 0 0  Altered sleeping 0 - - - 0 - -  Tired, decreased energy 0 - - - 0 - -  Change in appetite 0 - - - 0 - -  Feeling bad or failure about yourself  0 - - - 0 - -  Trouble concentrating 0 - - - 0  - -  Moving slowly or fidgety/restless 0 - - - 0 - -  Suicidal thoughts 0 - - - 0 - -  PHQ-9 Score 0 - - - 0 - -    Review of Systems  Constitutional: Negative.   HENT: Negative.   Eyes: Negative.  Respiratory: Negative.   Cardiovascular: Negative.   Gastrointestinal: Negative.   Endocrine: Negative.   Genitourinary: Negative.   Musculoskeletal: Positive for arthralgias, back pain and gait problem.       Pain in both knees  Skin: Negative.   Allergic/Immunologic: Negative.   Hematological: Negative.   Psychiatric/Behavioral: Negative.   All other systems reviewed and are negative.      Objective:   Physical Exam Gen: no distress, normal appearing HEENT: oral mucosa pink and moist, NCAT Cardio: Reg rate Chest: normal effort, normal rate of breathing Abd: soft, non-distended Ext: no edema Psych: pleasant, normal affect Skin: intact Neuro: Alert and oriented x3. Ambulating in office and outside with antalgic gait without AD. Tender to palpation in bilateral knee joint medial to lateral with bony protrusions. +genu valgum, left>right.    Assessment:         Plan:     Mr. Levontae Repass is 80 man who presents for follow-up of bilateral knee OA.  1) Chronic Pain Syndrome secondary to bilateral knee OA -Discussed current symptoms of pain and history of pain.  -Discussed benefits of exercise in reducing pain. -Discussed following foods that may reduce pain: 1) Ginger 2) Blueberries 3) Salmon 4) Pumpkin seeds 5) dark chocolate 6) turmeric 7) tart cherries 8) virgin olive oil 9) chilli peppers 10) mint  Link to further information on diet for chronic pain: [email or web address].[email or web address]   -XRs reviewed and show moderate tricompartmental arthritis.  -UDS obtained previously and is positive for cocaine. He denies cocaine use, but we cannot prescribe controlled substances in this case. Discussed with  him and offered referral to [facility] and he is agreeable. He would like to continue following here as well.  -Discussed that opioids have significant side effects of tolerance and dependence as well and are not intended for long-term treatment.  -Plan for steroid injection next visit. If he is able to get a surgical date in the near future, will cancel steroid injection.  Turmeric to reduce inflammation--can be used in cooking or taken as a supplement.  Benefits of turmeric:  -Highly anti-inflammatory  -Increases antioxidants  -Improves memory, attention, brain disease  -Lowers risk of heart disease  -May help prevent cancer  -Decreases pain  -Alleviates depression  -Delays aging and decreases risk of chronic disease  -Consume with black pepper to increase absorption    Turmeric Milk Recipe:  1 cup milk  1 tsp turmeric  1 tsp cinnamon  1 tsp grated ginger (optional)  Black pepper (boosts the anti-inflammatory properties of turmeric).  1 tsp honey  2) Impaired mobility and ADLs -Provided with handicap placard given antalgic gait and limited mobility  -Provided with script for a single-point cane.   3) stage 4 CKD: -discussed with Dr. Haroldine Laws, advised patient that Meloxicam is contraindicated given stage 4 CKD  40 minutes spent in discussion of patient's pain, treatment options, reviewing his toxicology screen with him, discussing Meloxicam and steroids as an option with patient and Dr. Haroldine Laws, discussing risks and benefits of steroid injections, discussing the importance of weight loss and anti-inflammatory foods, providing with script for single point cane and discussing pros and cons of single point cane vs. Quad cane.

## 2020-06-18 NOTE — Progress Notes (Deleted)
Subjective: No diagnosis found.  Bilateral nephrolithiasis with a partial staghorn on the left and moderate left upper pole hydronephrosis with atrophy of the upper pole.  He is doing well s/p left PCNL.  He has a residual  36m LUP stone that was in a calyx with a stenosed infundibulum that I was unable to access and has a 127mcollection of stones in the RLP.  He has some pain at the surgical site but one exam it appears to be secondary to a large skin tag that has some evidence of irritation.  His Cr is down to 2.98 from a high of 3.48 at the time of surgery.   His calcium is 10.4 and his PTH level was 225.     GU Hx: Mr. BlKneales a [age]yoAM who is sent in consultation by Dr. MaDicky Doeor bilateral nephrolithiasis found on a recent renal [place] evaluation of CKD 4.  There is a 1.6cm RLP stone and a probable staghorn stone on the left with moderate hydronephrosis.  He had a repeat CT prior to this visit to better assess the stone burden and the findings were confirmed with left upper pole obstruction.   There was a 1.6cm LLP partial staghorn stone and an 69m2mMP stone and 1.2cm LUP stone with upper pole obstruction and atrophy.  Cysts were present bilateral on both the CT and renal [place] His recent Cr is 2.78 which is stable over the past 3 months but down from the high.  His Calcium is 10.7 and he says he is being evaluated for hyperparathyroidism.   He has no pain.  He has no hematuria. He has had prior stones and had a lithotripsy several years ago.  He is currently on Brilinta for a coronary stent that was placed in November and is going to be off of it from placement of a defibrillator next week.  He will continue an 73m40mA.  .   ROS:  Review of Systems  Respiratory: Positive for shortness of breath (mild and infrequent).   Cardiovascular: Negative for chest pain.  All other systems reviewed and are negative.    No Known Allergies  Past Medical History:  Diagnosis Date  .  Acute ST elevation myocardial infarction (STEMI) due to occlusion of mid portion of left anterior descending (LAD) coronary artery (HCC)[place]/12/2018  . Acute systolic heart failure (HCC)[place]. Arthritis   . CHF (congestive heart failure) (HCC)[place]. CKD (chronic kidney disease) stage 4, GFR 15-29 ml/min (HCC)   . Dyspnea    with exertionm periodically  . Heart attack (HCC)[place]/03/2019   anterior MI   . History of kidney stones   . Hyperparathyroidism (HCC)[place]. Hypertension 2002  . Ischemic cardiomyopathy   . Left knee pain   . Syncope 04/04/2019   after blood draw  . Vitamin D deficiency     Past Surgical History:  Procedure Laterality Date  . AMPUTATION TOE Left 1991   2 digit of left foot  . CHOLECYSTECTOMY    . COLONOSCOPY    . CORONARY/GRAFT ACUTE MI REVASCULARIZATION N/A 03/12/2019   Procedure: Coronary/Graft Acute MI Revascularization;  Surgeon: SmitBelva Crome;  [facility];  Service: Cardiovascular;  Laterality: N/A;  . ICD IMPLANT N/A 08/24/2019   Procedure: ICD IMPLANT;  Surgeon: TaylEvans Lance;  [facility];  Service: Cardiovascular;  Laterality: N/A;  . IR URETERAL  STENT LEFT NEW ACCESS W/O SEP NEPHROSTOMY CATH  09/26/2019  . LEFT HEART CATH AND CORONARY ANGIOGRAPHY N/A 03/12/2019   Procedure: LEFT HEART CATH AND CORONARY ANGIOGRAPHY;  Surgeon: Belva Crome, MD;  Location: [facility];  Service: Cardiovascular;  Laterality: N/A;  . LITHOTRIPSY Right   . NEPHROLITHOTOMY Left 09/26/2019   Procedure: LEFT NEPHROLITHOTOMY PERCUTANEOUS;  Surgeon: Irine Seal, MD;  [facility];  Service: Urology;  Laterality: Left;    Social History   Socioeconomic History  . Marital status: Single    Spouse name: Not on file  . Number of children: 0  . Years of education: Not on file  . Highest education level: Some college, no degree  Occupational History  . Occupation: retired  Tobacco Use  . Smoking status: Former Smoker     Packs/day: 0.50    Years: 40.00    Pack years: 20.00    Types: Cigarettes    Quit date: 03/12/2019    Years since quitting: 1.2  . Smokeless tobacco: Never Used  Vaping Use  . Vaping Use: Never used  Substance and Sexual Activity  . Alcohol use: No  . Drug use: No  . Sexual activity: Not Currently  Other Topics Concern  . Not on file  Social History Narrative   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       Enjoys: fishing, shopping, working around the house       Diet: working on changes, avoiding fried foods, increased veggies   Caffeine: teas some daily   Water: 6-8 cups daily       Wears seat belt    Does not use phone while driving    Oceanographer at home    Social Determinants of Health   Financial Resource Strain: Low Risk   . Difficulty of Paying Living Expenses: Not hard at all  Food Insecurity: No Food Insecurity  . Worried About Charity fundraiser in the Last Year: Never true  . Ran Out of Food in the Last Year: Never true  Transportation Needs: No Transportation Needs  . Lack of Transportation (Medical): No  . Lack of Transportation (Non-Medical): No  Physical Activity: Insufficiently Active  . Days of Exercise per Week: 7 days  . Minutes of Exercise per Session: 10 min  Stress: No Stress Concern Present  . Feeling of Stress : Not at all  Social Connections: Moderately Isolated  . Frequency of Communication with Friends and Family: More than three times a week  . Frequency of Social Gatherings with Friends and Family: More than three times a week  . Attends Religious Services: More than 4 times per year  . Active Member of Clubs or Organizations: No  . Attends Archivist Meetings: Never  . Marital Status: Never married  Intimate Partner Violence: Not At Risk  . Fear of Current or Ex-Partner: No  . Emotionally Abused: No  . Physically Abused: No  . Sexually Abused: No    Family History  Problem Relation Age of Onset  . Heart failure  Mother   . Heart disease Mother   . Cancer Mother        breast  . Heart attack Father   . Hypertension Father   . Heart disease Father   . Diabetes Brother     Anti-infectives: Anti-infectives (From admission, onward)   None      Current Outpatient Medications  Medication Sig Dispense Refill  . Alcohol Swabs (B-D  SINGLE USE SWABS REGULAR) PADS USE AS DIRECTED 100 each 0  . allopurinol (ZYLOPRIM) 100 MG tablet Take 100 mg by mouth daily.     Marland Kitchen aspirin 81 MG chewable tablet Chew 1 tablet (81 mg total) by mouth daily. 90 tablet 1  . atorvastatin (LIPITOR) 80 MG tablet TAKE 1 TABLET BY MOUTH ONCE DAILY AT  6  PM 90 tablet 0  . blood glucose meter kit and supplies Dispense based on patient and insurance preference. Use up to four times daily as directed. (FOR ICD-10 E10.9, E11.9). 1 each 0  . BRILINTA 90 MG TABS tablet Take 1 tablet by mouth twice daily 180 tablet 0  . carvedilol (COREG) 12.5 MG tablet TAKE 1 TABLET BY MOUTH TWICE DAILY WITH MEALS 180 tablet 3  . furosemide (LASIX) 40 MG tablet TAKE 1 TABLET BY MOUTH ONCE DAILY AS NEEDED FOR FLUID 90 tablet 0  . gabapentin (NEURONTIN) 400 MG capsule Take 1 capsule (400 mg total) by mouth 3 (three) times daily. 90 capsule 2  . HYDROcodone-acetaminophen (NORCO) 10-325 MG tablet Take 1 tablet by mouth 2 (two) times daily as needed. (Patient not taking: Reported on 06/18/2020) 30 tablet 0  . isosorbide-hydrALAZINE (BIDIL) 20-37.5 MG tablet Take 1 tablet by mouth 3 (three) times daily. 270 tablet 3  . ivabradine (CORLANOR) 5 MG TABS tablet Take 1 tablet (5 mg total) by mouth 2 (two) times daily with a meal. (Patient not taking: Reported on 06/18/2020) 180 tablet 3  . meloxicam (MOBIC) 15 MG tablet Take 1 tablet (15 mg total) by mouth daily. 30 tablet 2  . nitroGLYCERIN (NITROSTAT) 0.4 MG SL tablet Place 1 tablet (0.4 mg total) under the tongue every 5 (five) minutes as needed. 25 tablet 2  . potassium citrate (UROCIT-K) 10 MEQ (1080 MG) SR  tablet Take 1 tablet by mouth daily.    . promethazine-dextromethorphan (PROMETHAZINE-DM) 6.25-15 MG/5ML syrup Take 5 mLs by mouth 4 (four) times daily as needed for cough. 118 mL 0  . sodium bicarbonate 650 MG tablet Take 650 mg by mouth 3 (three) times daily.    . TRUEplus Lancets 30G MISC Use as directed to check blood sugar daily. 100 each 1   No current facility-administered medications for this visit.     Objective:  There were no vitals taken for this visit.   Physical Exam Vitals reviewed.  Constitutional:      Appearance: Normal appearance.  Cardiovascular:     Rate and Rhythm: Normal rate and regular rhythm.     Heart sounds: Normal heart sounds.  Pulmonary:     Effort: Pulmonary effort is normal. No respiratory distress.     Breath sounds: Normal breath sounds.  Neurological:     Mental Status: He is alert.     Lab Results:  Cr was 2.98 on 6/4 and the calcium was 10.4.     BMET   Studies/Results:  Assessment/Plan: Bilateral nephrolithiasis with a partial staghorn on the left and moderate left upper pole hydronephrosis with atrophy of the upper pole.  He is doing well s/p left PCNL.  He has a residual  62m LUP stone that was in a calyx with a stenosed infundibulum that I was unable to access and has a 146mcollection of stones in the RLP.  I will have him return in 6 months with a KUB. He has some residual pyuria and hematuria on the dip UA.  I will send a UA and culture.    He has  some pain at the surgical site but one exam it appears to be secondary to a large skin tag that has some evidence of irritation. I will have him see Dermatology for this.  I have refilled the hydrocodone with a few pills.    His Cr is down to 2.98 from a high of 3.48 at the time of surgery.   His calcium is 10.4 and his PTH level was 225.   Dr. Gaston Islam manages his renal insufficiency.        .    No orders of the defined types were placed in this encounter.    No orders of the  defined types were placed in this encounter.    No follow-ups on file.    CC: Dr. Benny Lennert, Dr. Dicky Doe and Dr. Glori Bickers.      Irine Seal 06/18/2020 [phone]

## 2020-06-20 ENCOUNTER — Ambulatory Visit: Payer: Medicare Other | Admitting: Urology

## 2020-06-20 ENCOUNTER — Encounter: Payer: Self-pay | Admitting: Urology

## 2020-06-20 ENCOUNTER — Ambulatory Visit (INDEPENDENT_AMBULATORY_CARE_PROVIDER_SITE_OTHER): Payer: Medicare Other | Admitting: Urology

## 2020-06-20 ENCOUNTER — Other Ambulatory Visit: Payer: Self-pay

## 2020-06-20 VITALS — BP 152/94 | HR 68 | Temp 98.5°F | Ht 72.0 in | Wt 204.0 lb

## 2020-06-20 DIAGNOSIS — N200 Calculus of kidney: Secondary | ICD-10-CM | POA: Diagnosis not present

## 2020-06-20 DIAGNOSIS — N132 Hydronephrosis with renal and ureteral calculous obstruction: Secondary | ICD-10-CM

## 2020-06-20 LAB — URINALYSIS, ROUTINE W REFLEX MICROSCOPIC
Bilirubin, UA: NEGATIVE
Glucose, UA: NEGATIVE
Ketones, UA: NEGATIVE
Leukocytes,UA: NEGATIVE
Nitrite, UA: NEGATIVE
Specific Gravity, UA: 1.01 (ref 1.005–1.030)
Urobilinogen, Ur: 0.2 mg/dL (ref 0.2–1.0)
pH, UA: 5.5 (ref 5.0–7.5)

## 2020-06-20 LAB — MICROSCOPIC EXAMINATION
Bacteria, UA: NONE SEEN
Epithelial Cells (non renal): NONE SEEN /hpf (ref 0–10)
Renal Epithel, UA: NONE SEEN /hpf
WBC, UA: NONE SEEN /hpf (ref 0–5)

## 2020-06-20 NOTE — Progress Notes (Signed)
Subjective: 1. Renal stones     Bilateral nephrolithiasis with a partial staghorn on the left and moderate left upper pole hydronephrosis with atrophy of the upper pole.  He is doing well s/p left PCNL on 09/26/19.  Postop he had a residual  35m LUP stone that was in a calyx with a stenosed infundibulum that I was unable to access and has a 145mcollection of stones in the RLP.   A renal USKorearior to this visit shows a 2.1cm stone on the right and a 1.5cm stone on the left but no obstruction.  His last Cr was back up to 3.66.  His IPSS is 1 with nocturia x 1.    GU Hx: Mr. BlRapers a [age]yoAM who is sent in consultation by Dr. MaDicky Doeor bilateral nephrolithiasis found on a recent renal USKoreaor evaluation of CKD 4.  There is a 1.6cm RLP stone and a probable staghorn stone on the left with moderate hydronephrosis.  He had a repeat CT prior to this visit to better assess the stone burden and the findings were confirmed with left upper pole obstruction.   There was a 1.6cm LLP partial staghorn stone and an 19m49mMP stone and 1.2cm LUP stone with upper pole obstruction and atrophy.  Cysts were present bilateral on both the CT and renal US.[place] His recent Cr is 2.78 which is stable over the past 3 months but down from the high.  His Calcium is 10.7 and he says he is being evaluated for hyperparathyroidism.   He has no pain.  He has no hematuria. He has had prior stones and had a lithotripsy several years ago.  He is currently on Brilinta for a coronary stent that was placed in November and is going to be off of it from placement of a defibrillator next week.  He will continue an 64m92mA.  .   ROS:  ROS   No Known Allergies  Past Medical History:  Diagnosis Date  . Acute ST elevation myocardial infarction (STEMI) due to occlusion of mid portion of left anterior descending (LAD) coronary artery (HCC)[place]/12/2018  . Acute systolic heart failure (HCC)[place]. Arthritis   . CHF (congestive heart  failure) (HCC)[place]. CKD (chronic kidney disease) stage 4, GFR 15-29 ml/min (HCC)   . Dyspnea    with exertionm periodically  . Heart attack (HCC)[place]/03/2019   anterior MI   . History of kidney stones   . Hyperparathyroidism (HCC)[place]. Hypertension 2002  . Ischemic cardiomyopathy   . Left knee pain   . Syncope 04/04/2019   after blood draw  . Vitamin D deficiency     Past Surgical History:  Procedure Laterality Date  . AMPUTATION TOE Left 1991   2 digit of left foot  . CHOLECYSTECTOMY    . COLONOSCOPY    . CORONARY/GRAFT ACUTE MI REVASCULARIZATION N/A 03/12/2019   Procedure: Coronary/Graft Acute MI Revascularization;  Surgeon: SmitBelva Crome;  [facility];  Service: Cardiovascular;  Laterality: N/A;  . ICD IMPLANT N/A 08/24/2019   Procedure: ICD IMPLANT;  Surgeon: TaylEvans Lance;  [facility];  Service: Cardiovascular;  Laterality: N/A;  . IR URETERAL STENT LEFT NEW ACCESS W/O SEP NEPHROSTOMY CATH  09/26/2019  . LEFT HEART CATH AND CORONARY ANGIOGRAPHY N/A 03/12/2019   Procedure: LEFT HEART CATH AND CORONARY ANGIOGRAPHY;  Surgeon: SmitBelva Crome;  Location: [place] [facility];  Service: Cardiovascular;  Laterality: N/A;  . LITHOTRIPSY Right   . NEPHROLITHOTOMY Left 09/26/2019   Procedure: LEFT NEPHROLITHOTOMY PERCUTANEOUS;  Surgeon: Irine Seal, MD;  Location: [facility];  Service: Urology;  Laterality: Left;    Social History   Socioeconomic History  . Marital status: Single    Spouse name: Not on file  . Number of children: 0  . Years of education: Not on file  . Highest education level: Some college, no degree  Occupational History  . Occupation: retired  Tobacco Use  . Smoking status: Former Smoker    Packs/day: 0.50    Years: 40.00    Pack years: 20.00    Types: Cigarettes    Quit date: 03/12/2019    Years since quitting: 1.2  . Smokeless tobacco: Never Used  Vaping Use  . Vaping Use: Never used  Substance and Sexual  Activity  . Alcohol use: No  . Drug use: No  . Sexual activity: Not Currently  Other Topics Concern  . Not on file  Social History Narrative   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       Enjoys: fishing, shopping, working around the house       Diet: working on changes, avoiding fried foods, increased veggies   Caffeine: teas some daily   Water: 6-8 cups daily       Wears seat belt    Does not use phone while driving    Oceanographer at home    Social Determinants of Health   Financial Resource Strain: Low Risk   . Difficulty of Paying Living Expenses: Not hard at all  Food Insecurity: No Food Insecurity  . Worried About Charity fundraiser in the Last Year: Never true  . Ran Out of Food in the Last Year: Never true  Transportation Needs: No Transportation Needs  . Lack of Transportation (Medical): No  . Lack of Transportation (Non-Medical): No  Physical Activity: Insufficiently Active  . Days of Exercise per Week: 7 days  . Minutes of Exercise per Session: 10 min  Stress: No Stress Concern Present  . Feeling of Stress : Not at all  Social Connections: Moderately Isolated  . Frequency of Communication with Friends and Family: More than three times a week  . Frequency of Social Gatherings with Friends and Family: More than three times a week  . Attends Religious Services: More than 4 times per year  . Active Member of Clubs or Organizations: No  . Attends Archivist Meetings: Never  . Marital Status: Never married  Intimate Partner Violence: Not At Risk  . Fear of Current or Ex-Partner: No  . Emotionally Abused: No  . Physically Abused: No  . Sexually Abused: No    Family History  Problem Relation Age of Onset  . Heart failure Mother   . Heart disease Mother   . Cancer Mother        breast  . Heart attack Father   . Hypertension Father   . Heart disease Father   . Diabetes Brother     Anti-infectives: Anti-infectives (From admission, onward)    None      Current Outpatient Medications  Medication Sig Dispense Refill  . Alcohol Swabs (B-D SINGLE USE SWABS REGULAR) PADS USE AS DIRECTED 100 each 0  . allopurinol (ZYLOPRIM) 100 MG tablet Take 100 mg by mouth daily.     Marland Kitchen aspirin 81 MG chewable tablet Chew 1  tablet (81 mg total) by mouth daily. 90 tablet 1  . atorvastatin (LIPITOR) 80 MG tablet TAKE 1 TABLET BY MOUTH ONCE DAILY AT  6  PM 90 tablet 0  . blood glucose meter kit and supplies Dispense based on patient and insurance preference. Use up to four times daily as directed. (FOR ICD-10 E10.9, E11.9). 1 each 0  . BRILINTA 90 MG TABS tablet Take 1 tablet by mouth twice daily 180 tablet 0  . carvedilol (COREG) 12.5 MG tablet TAKE 1 TABLET BY MOUTH TWICE DAILY WITH MEALS 180 tablet 3  . furosemide (LASIX) 40 MG tablet TAKE 1 TABLET BY MOUTH ONCE DAILY AS NEEDED FOR FLUID 90 tablet 0  . gabapentin (NEURONTIN) 400 MG capsule Take 1 capsule (400 mg total) by mouth 3 (three) times daily. 90 capsule 2  . HYDROcodone-acetaminophen (NORCO) 10-325 MG tablet Take 1 tablet by mouth 2 (two) times daily as needed. 30 tablet 0  . isosorbide-hydrALAZINE (BIDIL) 20-37.5 MG tablet Take 1 tablet by mouth 3 (three) times daily. 270 tablet 3  . ivabradine (CORLANOR) 5 MG TABS tablet Take 1 tablet (5 mg total) by mouth 2 (two) times daily with a meal. 180 tablet 3  . meloxicam (MOBIC) 15 MG tablet Take 1 tablet (15 mg total) by mouth daily. 30 tablet 2  . nitroGLYCERIN (NITROSTAT) 0.4 MG SL tablet Place 1 tablet (0.4 mg total) under the tongue every 5 (five) minutes as needed. 25 tablet 2  . potassium citrate (UROCIT-K) 10 MEQ (1080 MG) SR tablet Take 1 tablet by mouth daily.    . promethazine-dextromethorphan (PROMETHAZINE-DM) 6.25-15 MG/5ML syrup Take 5 mLs by mouth 4 (four) times daily as needed for cough. 118 mL 0  . sodium bicarbonate 650 MG tablet Take 650 mg by mouth 3 (three) times daily.    . TRUEplus Lancets 30G MISC Use as directed to check blood  sugar daily. 100 each 1  . colchicine 0.6 MG tablet Take 0.6 mg by mouth daily.     No current facility-administered medications for this visit.     Objective:  BP (!) 152/94   Pulse 68   Temp 98.5 F (36.9 C)   Ht 6' (1.829 m)   Wt 204 lb (92.5 kg)   BMI 27.67 kg/m    Physical Exam  Lab Results:  Cr was 3.66 with a Ca of 9.9 on recent labs.      UA has 0-2 RBC's. Results for orders placed or performed in visit on 06/20/20 (from the past 24 hour(s))  Urinalysis, Routine w reflex microscopic     Status: Abnormal   Collection Time: 06/20/20  2:15 PM  Result Value Ref Range   Specific Gravity, UA 1.010 1.005 - 1.030   pH, UA 5.5 5.0 - 7.5   Color, UA Yellow Yellow   Appearance Ur Clear Clear   Leukocytes,UA Negative Negative   Protein,UA Trace (A) Negative/Trace   Glucose, UA Negative Negative   Ketones, UA Negative Negative   RBC, UA Trace (A) Negative   Bilirubin, UA Negative Negative   Urobilinogen, Ur 0.2 0.2 - 1.0 mg/dL   Nitrite, UA Negative Negative   Microscopic Examination See below:    Narrative   Performed at:  [facility] [address], [place], [place]  [id number] Lab Director: Mina Marble MT, Phone:  [phone]  Microscopic Examination     Status: None   Collection Time: 06/20/20  2:15 PM   Urine  Result Value Ref Range  WBC, UA None seen 0 - 5 /hpf   RBC 0-2 0 - 2 /hpf   Epithelial Cells (non renal) None seen 0 - 10 /hpf   Renal Epithel, UA None seen None seen /hpf   Bacteria, UA None seen None seen/Few   Narrative   Performed at:  [facility] [address], [place], [place]  [id number] Lab Director: [place], Phone:  [phone]    BMET   Studies/Results: CLINICAL DATA:  Stage 4 chronic kidney disease.  EXAM: RENAL / URINARY TRACT ULTRASOUND COMPLETE  COMPARISON:  07/11/2019 CT.  FINDINGS: Right Kidney:  Renal measurements: 10.3 x 5.0 x 5.3 cm = volume: 143 mL. Increased renal  echogenicity. No hydronephrosis.  Lower pole dominant 2.1 cm stone. Renal cysts or complex cysts including at 1.7 cm.  Left Kidney:  Renal measurements: 11.8 x 5.6 x 4.5 cm = volume: 157 mL. Increased echogenicity. No hydronephrosis. Stones of up to 1.5 cm including within the interpolar region.  Dominant lower pole left renal cyst of 6.0 cm.  Bladder:  Appears normal for degree of bladder distention.  Other:  Hyperechoic 3.3 cm splenic lesion including on image 38.  IMPRESSION: 1.  No hydronephrosis. 2. Increased renal echogenicity, suggesting medical renal disease. 3. Bilateral nephrolithiasis. 4. 3.3 cm nonspecific hyperechoic splenic lesion. Most likely a benign lesion such as a hemangioma or hamartoma. Consider ultrasound surveillance at 6 months.   Electronically Signed   By: Abigail Miyamoto M.D.   On: 06/05/2020 15:08     Reading Physician  Abigail Miyamoto, MD    [phone]   [phone]         Assessment/Plan: Bilateral nephrolithiasis with a partial staghorn on the left and moderate left upper pole hydronephrosis with atrophy of the upper pole.  He is doing well s/p left PCNL.  He had a residual  71m LUP stone that was in a calyx with a stenosed infundibulum that I was unable to access and has a 161mcollection of stones in the RLP.  His recent USKoreauggests that the stone in the left kidney is now 1.5cm and 2.1cm on the left but there is no obstruction.  I will get a KUB to assess further and have him return in 6 months with a KUB.  He will remain on the potassium citrate.     His Cr was back up to 3.66 from 2.98 after surgery.   His calcium was 9.9.   Dr. BuGaston Islamanages his renal insufficiency.        .    No orders of the defined types were placed in this encounter.    Orders Placed This Encounter  Procedures  . DG Abd 1 View    Standing Status:   Future    Standing Expiration Date:   07/18/2020    Order Specific Question:   Reason for  Exam (SYMPTOM  OR DIAGNOSIS REQUIRED)    Answer:   kidney stone    Order Specific Question:   [facility]    [facility]  Order Specific Question:   Radiology Contrast Protocol - do NOT remove file path    Answer:   [email or web address]  . DG Abd 1 View    Standing Status:   Future    Standing Expiration Date:   06/20/2021    Order Specific Question:   Reason for Exam (SYMPTOM  OR DIAGNOSIS REQUIRED)    Answer:   renal  stones    Order Specific Question:   Preferred imaging location?    Answer:   [facility]    Order Specific Question:   Radiology Contrast Protocol - do NOT remove file path    Answer:   [email or web address]  . Urinalysis, Routine w reflex microscopic     Return in about 6 months (around 12/18/2020) for with KUB.    CC: Dr. Benny Lennert, Dr. Dicky Doe and Dr. Glori Bickers.      Irine Seal 06/20/2020 [phone]

## 2020-06-20 NOTE — Progress Notes (Signed)

## 2020-06-28 ENCOUNTER — Encounter: Payer: Medicare HMO | Admitting: Family Medicine

## 2020-07-02 ENCOUNTER — Encounter: Payer: Medicare Other | Admitting: Internal Medicine

## 2020-07-08 IMAGING — DX DG CHEST 1V PORT
1 series · 1 of 1 positions shown · non-contrast
Comparison: 08/24/2019

CLINICAL DATA: Hypoxia

EXAM:
PORTABLE CHEST 1 VIEW

[chest ap]
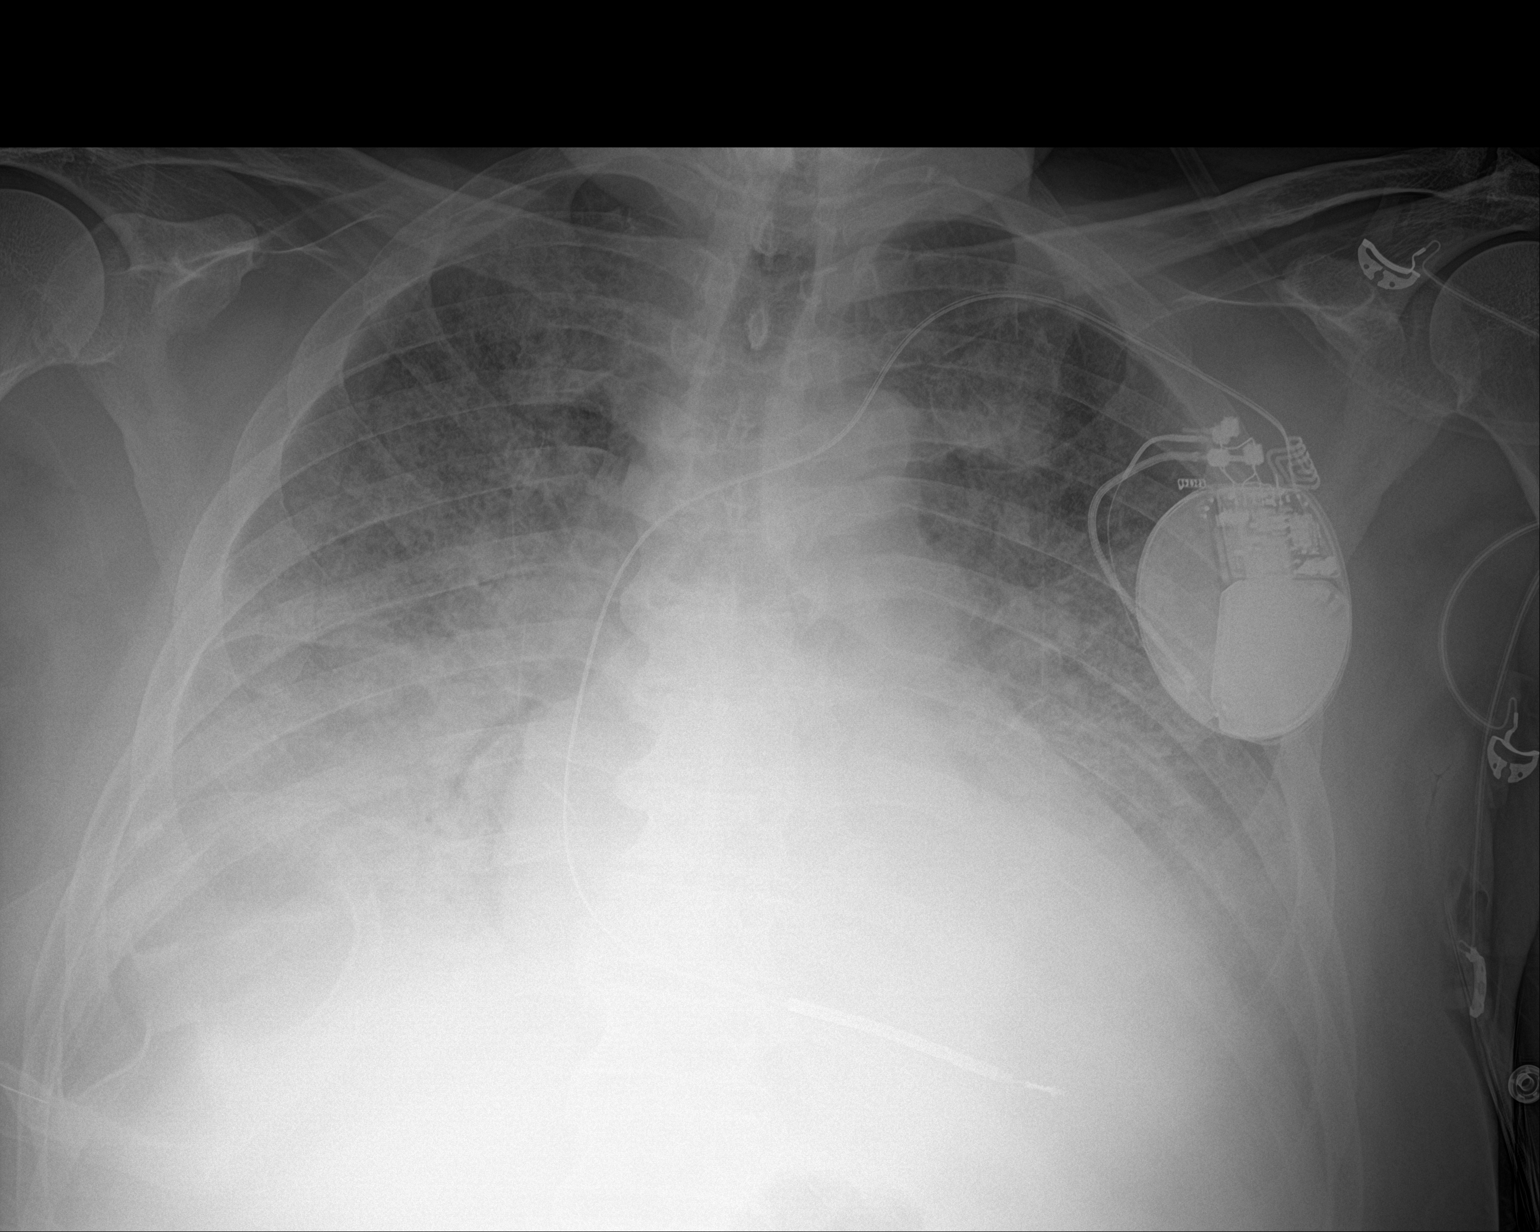

[1 of 1 positions shown; findings below may reference images not displayed]

FINDINGS: Left-sided single lead pacing device as before. Cardiomegaly with
vascular congestion. Extensive interstitial and ground-glass opacity
bilaterally suspicious for pulmonary edema. Probable pleural
effusions. Basilar consolidations. No pneumothorax. Curvilinear
opacity over the right upper quadrant suspect external artifact.
IMPRESSION: 1. Cardiomegaly with vascular congestion and extensive bilateral
interstitial and ground-glass opacity suspicious for pulmonary
edema. Probable pleural effusions.
2. Basilar consolidations may reflect atelectasis or pneumonia.

## 2020-07-09 IMAGING — DX DG CHEST 1V PORT
1 series · 1 of 1 positions shown · non-contrast
Comparison: 09/26/2019

CLINICAL DATA: Congestive heart failure.

EXAM:
PORTABLE CHEST 1 VIEW

[chest ap]
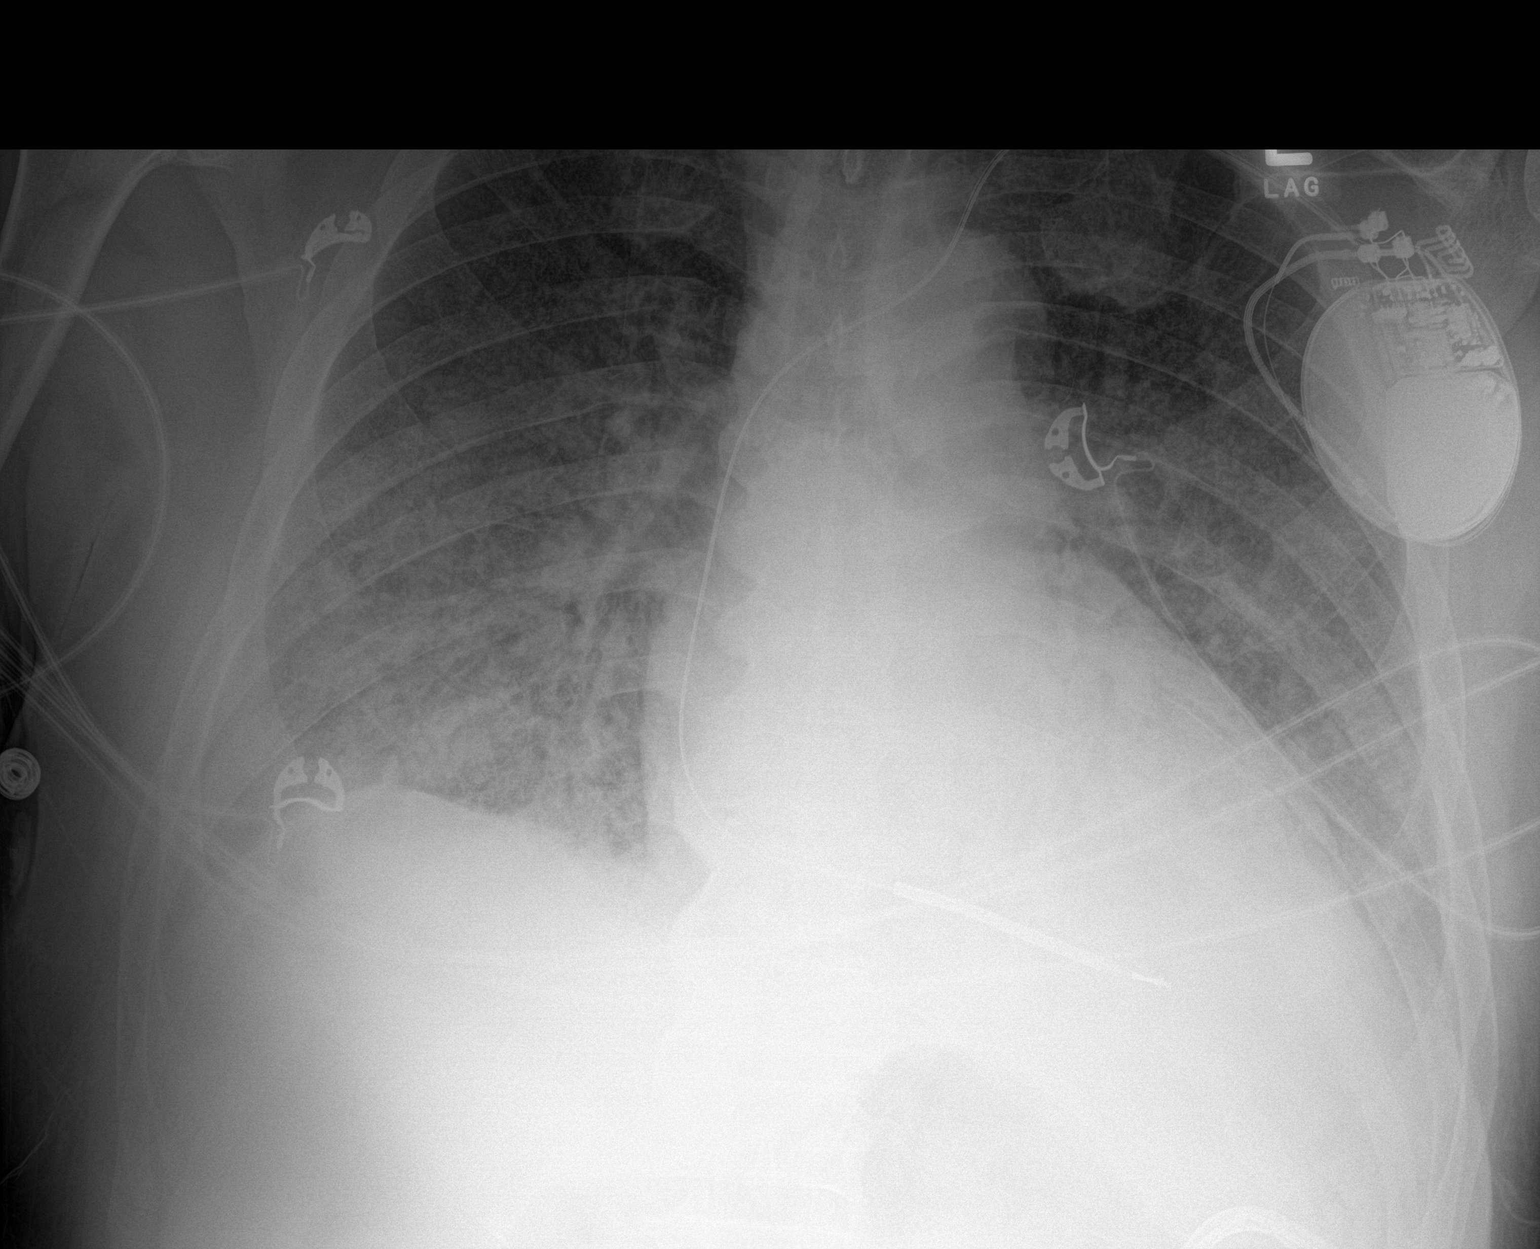

[1 of 1 positions shown; findings below may reference images not displayed]

FINDINGS: Unchanged position of left chest wall single lead AICD. Moderate
cardiomegaly with unchanged bilateral alveolar pulmonary edema. No
sizable pleural effusion.
IMPRESSION: Unchanged cardiomegaly and bilateral pulmonary edema.

## 2020-07-25 ENCOUNTER — Encounter
Payer: Medicare Other | Attending: Physical Medicine and Rehabilitation | Admitting: Physical Medicine and Rehabilitation

## 2020-07-25 ENCOUNTER — Other Ambulatory Visit: Payer: Self-pay

## 2020-07-25 ENCOUNTER — Other Ambulatory Visit: Payer: Self-pay | Admitting: Physical Medicine and Rehabilitation

## 2020-07-25 ENCOUNTER — Encounter: Payer: Self-pay | Admitting: Physical Medicine and Rehabilitation

## 2020-07-25 DIAGNOSIS — M170 Bilateral primary osteoarthritis of knee: Secondary | ICD-10-CM | POA: Insufficient documentation

## 2020-07-25 MED ORDER — IBUPROFEN 800 MG PO TABS: 800.0000 mg | ORAL_TABLET | Freq: Every day | ORAL | 0 refills | Status: DC | PRN

## 2020-07-25 NOTE — Progress Notes (Signed)
Subjective:     Patient ID: [name], male   DOB: 08-17-1955, 65 y.o.   MRN: [id number]  HPI  Due to national recommendations of social distancing because of COVID 62, an audio/video tele-health visit is felt to be the most appropriate encounter for this patient at this time. See MyChart message from today for the patient's consent to a tele-health encounter with [place]. This is a follow up tele-visit via phone. The patient is at home. MD is at office.   Mr. Buschman is a 65 year old man who presents for follow-up of severe bilateral knee OA.   -He has bilateral knee pain, 10/10, that is sharp, stabbing, and aching.   -He used to walk a lot in his work.   -He has had injections in his bilateral knees with steroid and Monovisc without benefit. He has never tried oral steroids.   -He would like a refill of Ibuprofen. Discussed risks of this medication given stage 2 CKD but he is willing to accept risks given level of pain. He is trying to use sparingly when pain is severe- no more than once per day.  -He would like steroid injection next appointment  -Fortunately he was recently cleared to get a left knee replacement by Dr. Haroldine Laws at his appointment yesterday- wears a Lifevest and has ICD- has been doing well in this regard.   -Urine sample contained cocaine so I informed him I am unable to prescribe controlled substances for him   Pain Inventory Average Pain 9 Pain Right Now 10 My pain is constant and sharp  In the last 24 hours, has pain interfered with the following? General activity 8 Relation with others 8 Enjoyment of life 8 What TIME of day is your pain at its worst? morning  and night Sleep (in general) Poor  Pain is worse with: walking Pain improves with: medication Relief from Meds: 8        Family History  Problem Relation Age of Onset  . Heart failure Mother   . Heart disease Mother   . Cancer Mother         breast  . Heart attack Father   . Hypertension Father   . Heart disease Father   . Diabetes Brother    Social History   Socioeconomic History  . Marital status: Single    Spouse name: Not on file  . Number of children: 0  . Years of education: Not on file  . Highest education level: Some college, no degree  Occupational History  . Occupation: retired  Tobacco Use  . Smoking status: Former Smoker    Packs/day: 0.50    Years: 40.00    Pack years: 20.00    Types: Cigarettes    Quit date: 03/12/2019    Years since quitting: 1.3  . Smokeless tobacco: Never Used  Vaping Use  . Vaping Use: Never used  Substance and Sexual Activity  . Alcohol use: No  . Drug use: No  . Sexual activity: Not Currently  Other Topics Concern  . Not on file  Social History Narrative   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       Enjoys: fishing, shopping, working around the house       Diet: working on changes, avoiding fried foods, increased veggies   Caffeine: teas some daily   Water: 6-8 cups daily       Wears seat belt    Does not use  phone while driving    Smoke detectors at home    Social Determinants of Health   Financial Resource Strain: Low Risk   . Difficulty of Paying Living Expenses: Not hard at all  Food Insecurity: No Food Insecurity  . Worried About Charity fundraiser in the Last Year: Never true  . Ran Out of Food in the Last Year: Never true  Transportation Needs: No Transportation Needs  . Lack of Transportation (Medical): No  . Lack of Transportation (Non-Medical): No  Physical Activity: Insufficiently Active  . Days of Exercise per Week: 7 days  . Minutes of Exercise per Session: 10 min  Stress: No Stress Concern Present  . Feeling of Stress : Not at all  Social Connections: Moderately Isolated  . Frequency of Communication with Friends and Family: More than three times a week  . Frequency of Social Gatherings with Friends and Family: More than three times a  week  . Attends Religious Services: More than 4 times per year  . Active Member of Clubs or Organizations: No  . Attends Archivist Meetings: Never  . Marital Status: Never married   Past Surgical History:  Procedure Laterality Date  . AMPUTATION TOE Left 1991   2 digit of left foot  . CHOLECYSTECTOMY    . COLONOSCOPY    . CORONARY/GRAFT ACUTE MI REVASCULARIZATION N/A 03/12/2019   Procedure: Coronary/Graft Acute MI Revascularization;  Surgeon: Belva Crome, MD;  Location: [place] [facility];  Service: Cardiovascular;  Laterality: N/A;  . ICD IMPLANT N/A 08/24/2019   Procedure: ICD IMPLANT;  Surgeon: Evans Lance, MD;  Location: [place] [facility];  Service: Cardiovascular;  Laterality: N/A;  . IR URETERAL STENT LEFT NEW ACCESS W/O SEP NEPHROSTOMY CATH  09/26/2019  . LEFT HEART CATH AND CORONARY ANGIOGRAPHY N/A 03/12/2019   Procedure: LEFT HEART CATH AND CORONARY ANGIOGRAPHY;  Surgeon: Belva Crome, MD;  Location: [place] [facility];  Service: Cardiovascular;  Laterality: N/A;  . LITHOTRIPSY Right   . NEPHROLITHOTOMY Left 09/26/2019   Procedure: LEFT NEPHROLITHOTOMY PERCUTANEOUS;  Surgeon: Irine Seal, MD;  [facility];  Service: Urology;  Laterality: Left;   Past Medical History:  Diagnosis Date  . Acute ST elevation myocardial infarction (STEMI) due to occlusion of mid portion of left anterior descending (LAD) coronary artery ([place]) 03/12/2019  . Acute systolic heart failure ([place])   . Arthritis   . CHF (congestive heart failure) ([place])   . CKD (chronic kidney disease) stage 4, GFR 15-29 ml/min (HCC)   . Dyspnea    with exertionm periodically  . Heart attack ([place]) 03/15/2019   anterior MI   . History of kidney stones   . Hyperparathyroidism ([place])   . Hypertension 2002  . Ischemic cardiomyopathy   . Left knee pain   . Syncope 04/04/2019   after blood draw  . Vitamin D deficiency    There were no vitals taken for this visit.  Opioid Risk Score:   Fall Risk  Score:  `1  Depression screen PHQ 2/9  Depression screen [facility] 2/9 05/16/2020 12/27/2019 12/27/2019 07/27/2019 06/19/2019 04/11/2019 03/21/2019  Decreased Interest 0 0 0 0 0 0 0  Down, Depressed, Hopeless 0 0 0 0 0 0 0  PHQ - 2 Score 0 0 0 0 0 0 0  Altered sleeping 0 - - - 0 - -  Tired, decreased energy 0 - - - 0 - -  Change in appetite 0 - - -  0 - -  Feeling bad or failure about yourself  0 - - - 0 - -  Trouble concentrating 0 - - - 0 - -  Moving slowly or fidgety/restless 0 - - - 0 - -  Suicidal thoughts 0 - - - 0 - -  PHQ-9 Score 0 - - - 0 - -    Review of Systems  Constitutional: Negative.   HENT: Negative.   Eyes: Negative.   Respiratory: Negative.   Cardiovascular: Negative.   Gastrointestinal: Negative.   Endocrine: Negative.   Genitourinary: Negative.   Musculoskeletal: Positive for arthralgias, back pain and gait problem.       Pain in both knees  Skin: Negative.   Allergic/Immunologic: Negative.   Hematological: Negative.   Psychiatric/Behavioral: Negative.   All other systems reviewed and are negative.      Objective:   Physical Exam Not performed as patient was seen via phone.     Assessment:         Plan:     Mr. Dontea Depaulo is 85 man who presents for follow-up of bilateral knee OA.  1) Chronic Pain Syndrome secondary to bilateral knee OA -Discussed current symptoms of pain and history of pain.  -Discussed benefits of exercise in reducing pain. -Discussed following foods that may reduce pain: 1) Ginger 2) Blueberries 3) Salmon 4) Pumpkin seeds 5) dark chocolate 6) turmeric 7) tart cherries 8) virgin olive oil 9) chilli peppers 10) mint  Link to further information on diet for chronic pain: [email or web address].randall.com/   -XRs reviewed and show moderate tricompartmental arthritis.  -UDS obtained previously and is positive for cocaine. He denies cocaine use, but we cannot  prescribe controlled substances in this case. Discussed with him and offered referral to [facility] and he is agreeable. He would like to continue following here as well.  -Discussed that opioids have significant side effects of tolerance and dependence as well and are not intended for long-term treatment.  -Plan for steroid injection next visit -He plans for TKA eventually -Prescribed ibuprofen '800mg'$  once daily prn. Advised regarding risks to kidney given stage 4 CKD but patient would like this medication anyway given severity of pain. Continue to use sparingly only for severe pain  Turmeric to reduce inflammation--can be used in cooking or taken as a supplement.  Benefits of turmeric:  -Highly anti-inflammatory  -Increases antioxidants  -Improves memory, attention, brain disease  -Lowers risk of heart disease  -May help prevent cancer  -Decreases pain  -Alleviates depression  -Delays aging and decreases risk of chronic disease  -Consume with black pepper to increase absorption    Turmeric Milk Recipe:  1 cup milk  1 tsp turmeric  1 tsp cinnamon  1 tsp grated ginger (optional)  Black pepper (boosts the anti-inflammatory properties of turmeric).  1 tsp honey  2) Impaired mobility and ADLs -Provided with handicap placard given antalgic gait and limited mobility  -Provided with script for a single-point cane.   3) stage 4 CKD: -discussed with Dr. Haroldine Laws, advised patient that Meloxicam is contraindicated given stage 4 CKD  10 minutes spent in discussion of pain, risk of NSAIDs given CKD, plan for steroid injection next visit, sending medication for patient

## 2020-07-29 ENCOUNTER — Encounter: Payer: Medicare Other | Admitting: Internal Medicine

## 2020-08-22 ENCOUNTER — Other Ambulatory Visit: Payer: Self-pay | Admitting: Cardiology

## 2020-08-22 ENCOUNTER — Other Ambulatory Visit (HOSPITAL_COMMUNITY): Payer: Self-pay | Admitting: Internal Medicine

## 2020-08-22 ENCOUNTER — Telehealth: Payer: Self-pay

## 2020-08-22 ENCOUNTER — Other Ambulatory Visit: Payer: Self-pay | Admitting: Physical Medicine and Rehabilitation

## 2020-08-22 MED ORDER — GABAPENTIN 600 MG PO TABS: 600.0000 mg | ORAL_TABLET | Freq: Three times a day (TID) | ORAL | 2 refills | Status: DC

## 2020-08-22 NOTE — Telephone Encounter (Signed)
[name] has requested Gabapentin 600 mg. If granted, please send to [facility] in [place]. Thank you.   Call back phone [phone]

## 2020-08-23 ENCOUNTER — Ambulatory Visit (INDEPENDENT_AMBULATORY_CARE_PROVIDER_SITE_OTHER): Payer: Medicare Other

## 2020-08-23 DIAGNOSIS — I5022 Chronic systolic (congestive) heart failure: Secondary | ICD-10-CM

## 2020-08-23 DIAGNOSIS — I255 Ischemic cardiomyopathy: Secondary | ICD-10-CM

## 2020-08-24 LAB — CUP PACEART REMOTE DEVICE CHECK
Battery Remaining Longevity: 180 mo
Battery Remaining Percentage: 100 %
Brady Statistic RV Percent Paced: 0 %
Date Time Interrogation Session: 20220422062400
HighPow Impedance: 64 Ohm
Implantable Lead Implant Date: 20210422
Implantable Lead Location: 753860
Implantable Lead Model: 138
Implantable Lead Serial Number: 302700
Implantable Pulse Generator Implant Date: 20210422
Lead Channel Impedance Value: 426 Ohm
Lead Channel Setting Pacing Amplitude: 2.5 V
Lead Channel Setting Pacing Pulse Width: 0.4 ms
Lead Channel Setting Sensing Sensitivity: 0.5 mV
Pulse Gen Serial Number: 209495

## 2020-08-28 ENCOUNTER — Encounter: Payer: Self-pay | Admitting: Internal Medicine

## 2020-08-28 ENCOUNTER — Other Ambulatory Visit: Payer: Self-pay

## 2020-08-28 ENCOUNTER — Ambulatory Visit (INDEPENDENT_AMBULATORY_CARE_PROVIDER_SITE_OTHER): Payer: Medicare Other | Admitting: Internal Medicine

## 2020-08-28 VITALS — BP 122/82 | HR 85 | Resp 18 | Ht 72.0 in | Wt 204.8 lb

## 2020-08-28 DIAGNOSIS — Z79899 Other long term (current) drug therapy: Secondary | ICD-10-CM | POA: Diagnosis not present

## 2020-08-28 DIAGNOSIS — I5022 Chronic systolic (congestive) heart failure: Secondary | ICD-10-CM

## 2020-08-28 DIAGNOSIS — J019 Acute sinusitis, unspecified: Secondary | ICD-10-CM | POA: Diagnosis not present

## 2020-08-28 DIAGNOSIS — Z0000 Encounter for general adult medical examination without abnormal findings: Secondary | ICD-10-CM | POA: Diagnosis not present

## 2020-08-28 DIAGNOSIS — D509 Iron deficiency anemia, unspecified: Secondary | ICD-10-CM | POA: Diagnosis not present

## 2020-08-28 DIAGNOSIS — R972 Elevated prostate specific antigen [PSA]: Secondary | ICD-10-CM

## 2020-08-28 DIAGNOSIS — N184 Chronic kidney disease, stage 4 (severe): Secondary | ICD-10-CM

## 2020-08-28 DIAGNOSIS — I10 Essential (primary) hypertension: Secondary | ICD-10-CM

## 2020-08-28 DIAGNOSIS — H6123 Impacted cerumen, bilateral: Secondary | ICD-10-CM

## 2020-08-28 DIAGNOSIS — E785 Hyperlipidemia, unspecified: Secondary | ICD-10-CM | POA: Diagnosis not present

## 2020-08-28 DIAGNOSIS — R809 Proteinuria, unspecified: Secondary | ICD-10-CM | POA: Diagnosis not present

## 2020-08-28 MED ORDER — FLUTICASONE PROPIONATE 50 MCG/ACT NA SUSP: 2.0000 | Freq: Every day | NASAL | 6 refills | Status: DC

## 2020-08-28 MED ORDER — AZITHROMYCIN 250 MG PO TABS: ORAL_TABLET | ORAL | 0 refills | Status: AC

## 2020-08-28 NOTE — Assessment & Plan Note (Signed)
BP Readings from Last 1 Encounters:  08/28/20 122/82   Well-controlled Counseled for compliance with the medications Advised DASH diet and ambulation as tolerated

## 2020-08-28 NOTE — Assessment & Plan Note (Signed)
Progressive CKD stage 4 Still takes NSAIDs PRN, understands risk of kidney damage Follows up with Nephrology [facility] and sodium bicarb tablets

## 2020-08-28 NOTE — Progress Notes (Signed)
Established Patient Office Visit  Subjective:  Patient ID: [name], male    DOB: 12-17-1955  Age: 65 y.o. MRN: [id number]  CC:  Chief Complaint  Patient presents with  . Annual Exam    Annual exam has bad sinuses right now cough congestion runny nose ears feel full also knees have been giving him trouble     HPI [name] presents for annual physical.  HTN: BP is well-controlled. Takes medications regularly. Patient denies headache, dizziness, chest pain, dyspnea or palpitations.  CKD stage 4: Progressive. Followed by Nephrology. Has follow up appointment tomorrow.  Knee OA: He takes Gabapentin and PRN Ibuprofen. He is followed by pain medicine and Orthopedic surgeon.  Sinusitis and ear discomfort: He has been having frontal headache with nasal congestion and pain behind his eyes for last 1 week. Denies any fever, chills, sore throat, dyspnea or wheezing. He also states that he feels ear discomfort and feels he has stuck ear wax on right side.  Colon cancer screening: He states that he has sent Cologuard sample, but result is not available for review in EMR. Will try to obtain result.  Past Medical History:  Diagnosis Date  . Acute ST elevation myocardial infarction (STEMI) due to occlusion of mid portion of left anterior descending (LAD) coronary artery ([place]) 03/12/2019  . Acute systolic heart failure ([place])   . Arthritis   . CHF (congestive heart failure) ([place])   . CKD (chronic kidney disease) stage 4, GFR 15-29 ml/min (HCC)   . Dyspnea    with exertionm periodically  . Heart attack ([place]) 03/15/2019   anterior MI   . History of kidney stones   . Hyperparathyroidism ([place])   . Hypertension 2002  . Ischemic cardiomyopathy   . Left knee pain   . Syncope 04/04/2019   after blood draw  . Vitamin D deficiency     Past Surgical History:  Procedure Laterality Date  . AMPUTATION TOE Left 1991   2 digit of left foot  . CHOLECYSTECTOMY    . COLONOSCOPY    .  CORONARY/GRAFT ACUTE MI REVASCULARIZATION N/A 03/12/2019   Procedure: Coronary/Graft Acute MI Revascularization;  Surgeon: Belva Crome, MD;  Location: [facility];  Service: Cardiovascular;  Laterality: N/A;  . ICD IMPLANT N/A 08/24/2019   Procedure: ICD IMPLANT;  Surgeon: Evans Lance, MD;  Location: [facility];  Service: Cardiovascular;  Laterality: N/A;  . IR URETERAL STENT LEFT NEW ACCESS W/O SEP NEPHROSTOMY CATH  09/26/2019  . LEFT HEART CATH AND CORONARY ANGIOGRAPHY N/A 03/12/2019   Procedure: LEFT HEART CATH AND CORONARY ANGIOGRAPHY;  Surgeon: Belva Crome, MD;  Location: [facility];  Service: Cardiovascular;  Laterality: N/A;  . LITHOTRIPSY Right   . NEPHROLITHOTOMY Left 09/26/2019   Procedure: LEFT NEPHROLITHOTOMY PERCUTANEOUS;  Surgeon: Irine Seal, MD;  Location: [facility];  Service: Urology;  Laterality: Left;    Family History  Problem Relation Age of Onset  . Heart failure Mother   . Heart disease Mother   . Cancer Mother        breast  . Heart attack Father   . Hypertension Father   . Heart disease Father   . Diabetes Brother     Social History   Socioeconomic History  . Marital status: Single    Spouse name: Not on file  . Number of children: 0  . Years of education: Not on file  . Highest education level: Some college, no  degree  Occupational History  . Occupation: retired  Tobacco Use  . Smoking status: Former Smoker    Packs/day: 0.50    Years: 40.00    Pack years: 20.00    Types: Cigarettes    Quit date: 03/12/2019    Years since quitting: 1.4  . Smokeless tobacco: Never Used  Vaping Use  . Vaping Use: Never used  Substance and Sexual Activity  . Alcohol use: No  . Drug use: No  . Sexual activity: Not Currently  Other Topics Concern  . Not on file  Social History Narrative   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       Enjoys: fishing, shopping, working around the house       Diet: working on changes, avoiding fried  foods, increased veggies   Caffeine: teas some daily   Water: 6-8 cups daily       Wears seat belt    Does not use phone while driving    Oceanographer at home    Social Determinants of Health   Financial Resource Strain: Low Risk   . Difficulty of Paying Living Expenses: Not hard at all  Food Insecurity: No Food Insecurity  . Worried About Charity fundraiser in the Last Year: Never true  . Ran Out of Food in the Last Year: Never true  Transportation Needs: No Transportation Needs  . Lack of Transportation (Medical): No  . Lack of Transportation (Non-Medical): No  Physical Activity: Insufficiently Active  . Days of Exercise per Week: 7 days  . Minutes of Exercise per Session: 10 min  Stress: No Stress Concern Present  . Feeling of Stress : Not at all  Social Connections: Moderately Isolated  . Frequency of Communication with Friends and Family: More than three times a week  . Frequency of Social Gatherings with Friends and Family: More than three times a week  . Attends Religious Services: More than 4 times per year  . Active Member of Clubs or Organizations: No  . Attends Archivist Meetings: Never  . Marital Status: Never married  Intimate Partner Violence: Not At Risk  . Fear of Current or Ex-Partner: No  . Emotionally Abused: No  . Physically Abused: No  . Sexually Abused: No    Outpatient Medications Prior to Visit  Medication Sig Dispense Refill  . Alcohol Swabs (B-D SINGLE USE SWABS REGULAR) PADS USE AS DIRECTED 100 each 0  . aspirin 81 MG chewable tablet Chew 1 tablet (81 mg total) by mouth daily. 90 tablet 1  . atorvastatin (LIPITOR) 80 MG tablet TAKE 1 TABLET BY MOUTH ONCE DAILY AT  6  PM 90 tablet 0  . blood glucose meter kit and supplies Dispense based on patient and insurance preference. Use up to four times daily as directed. (FOR ICD-10 E10.9, E11.9). 1 each 0  . carvedilol (COREG) 12.5 MG tablet TAKE 1 TABLET BY MOUTH TWICE DAILY WITH MEALS 60  tablet 1  . colchicine 0.6 MG tablet Take 0.6 mg by mouth daily.    . furosemide (LASIX) 40 MG tablet TAKE 1 TABLET BY MOUTH ONCE DAILY 35 tablet 0  . gabapentin (NEURONTIN) 600 MG tablet Take 1 tablet (600 mg total) by mouth 3 (three) times daily. 90 tablet 2  . HYDROcodone-acetaminophen (NORCO) 10-325 MG tablet Take 1 tablet by mouth 2 (two) times daily as needed. 30 tablet 0  . ibuprofen (ADVIL) 800 MG tablet Take 1 tablet (800 mg total) by mouth  daily as needed. 30 tablet 0  . isosorbide-hydrALAZINE (BIDIL) 20-37.5 MG tablet Take 1 tablet by mouth 3 (three) times daily. 270 tablet 3  . ivabradine (CORLANOR) 5 MG TABS tablet Take 1 tablet (5 mg total) by mouth 2 (two) times daily with a meal. 180 tablet 3  . meloxicam (MOBIC) 15 MG tablet Take 1 tablet (15 mg total) by mouth daily. 30 tablet 2  . nitroGLYCERIN (NITROSTAT) 0.4 MG SL tablet Place 1 tablet (0.4 mg total) under the tongue every 5 (five) minutes as needed. 25 tablet 2  . potassium citrate (UROCIT-K) 10 MEQ (1080 MG) SR tablet Take 1 tablet by mouth daily.    . promethazine-dextromethorphan (PROMETHAZINE-DM) 6.25-15 MG/5ML syrup Take 5 mLs by mouth 4 (four) times daily as needed for cough. 118 mL 0  . sodium bicarbonate 650 MG tablet Take 650 mg by mouth 3 (three) times daily.    . ticagrelor (BRILINTA) 60 MG TABS tablet Take 1 tablet (60 mg total) by mouth 2 (two) times daily. 90 tablet 1  . TRUEplus Lancets 30G MISC Use as directed to check blood sugar daily. 100 each 1  . allopurinol (ZYLOPRIM) 100 MG tablet Take 100 mg by mouth daily.      No facility-administered medications prior to visit.    No Known Allergies  ROS Review of Systems  Constitutional: Negative for chills and fever.  HENT: Positive for congestion, sinus pressure and sinus pain. Negative for ear discharge and sore throat.   Eyes: Negative for pain and discharge.  Respiratory: Negative for cough and shortness of breath.   Cardiovascular: Negative for chest  pain and palpitations.  Gastrointestinal: Negative for constipation, diarrhea, nausea and vomiting.  Endocrine: Negative for polydipsia and polyuria.  Genitourinary: Negative for dysuria and hematuria.  Musculoskeletal: Positive for arthralgias and back pain. Negative for neck pain and neck stiffness.  Skin: Negative for rash.  Neurological: Negative for dizziness, weakness, numbness and headaches.  Psychiatric/Behavioral: Negative for agitation and behavioral problems.      Objective:    Physical Exam Vitals reviewed.  Constitutional:      General: He is not in acute distress.    Appearance: He is not diaphoretic.  HENT:     Head: Normocephalic and atraumatic.     Right Ear: There is impacted cerumen.     Left Ear: There is impacted cerumen.     Nose: Nose normal.     Mouth/Throat:     Mouth: Mucous membranes are moist.  Eyes:     General: No scleral icterus.    Extraocular Movements: Extraocular movements intact.  Cardiovascular:     Rate and Rhythm: Normal rate and regular rhythm.     Pulses: Normal pulses.     Heart sounds: Normal heart sounds. No murmur heard.   Pulmonary:     Breath sounds: Normal breath sounds. No wheezing or rales.  Abdominal:     Palpations: Abdomen is soft.     Tenderness: There is no abdominal tenderness.  Musculoskeletal:     Cervical back: Neck supple. No tenderness.     Right lower leg: No edema.     Left lower leg: No edema.  Skin:    General: Skin is warm.     Findings: No rash.  Neurological:     General: No focal deficit present.     Mental Status: He is alert and oriented to person, place, and time.     Sensory: No sensory deficit.  Motor: No weakness.  Psychiatric:        Mood and Affect: Mood normal.        Behavior: Behavior normal.     BP 122/82 (BP Location: Right Arm, Patient Position: Sitting, Cuff Size: Normal)   Pulse 85   Resp 18   Ht 6' (1.829 m)   Wt 204 lb 12.8 oz (92.9 kg)   SpO2 98%   BMI 27.78 kg/m   Wt Readings from Last 3 Encounters:  08/28/20 204 lb 12.8 oz (92.9 kg)  06/20/20 204 lb (92.5 kg)  06/18/20 206 lb (93.4 kg)     Health Maintenance Due  Topic Date Due  . Hepatitis C Screening  Never done  . TETANUS/TDAP  Never done  . COLONOSCOPY (Pts 45-39yr Insurance coverage will need to be confirmed)  Never done  . COVID-19 Vaccine (2 - Booster for Janssen series) 09/17/2019    There are no preventive care reminders to display for this patient.  No results found for: TSH Lab Results  Component Value Date   WBC 5.4 02/15/2020   HGB 12.5 (L) 02/15/2020   HCT 39.5 02/15/2020   MCV 98.0 02/15/2020   PLT 152 02/15/2020   Lab Results  Component Value Date   NA 141 02/15/2020   K 4.3 02/15/2020   CO2 21 (L) 02/15/2020   GLUCOSE 103 (H) 02/15/2020   BUN 40 (H) 02/15/2020   CREATININE 3.66 (H) 02/15/2020   BILITOT 1.4 (H) 02/15/2020   ALKPHOS 78 02/15/2020   AST 13 (L) 02/15/2020   ALT 12 02/15/2020   PROT 6.8 02/15/2020   ALBUMIN 3.6 02/15/2020   CALCIUM 9.9 02/15/2020   ANIONGAP 11 02/15/2020   Lab Results  Component Value Date   CHOL 129 02/15/2020   Lab Results  Component Value Date   HDL 55 02/15/2020   Lab Results  Component Value Date   LDLCALC 42 02/15/2020   Lab Results  Component Value Date   TRIG 158 (H) 02/15/2020   Lab Results  Component Value Date   CHOLHDL 2.3 02/15/2020   Lab Results  Component Value Date   HGBA1C 5.4 03/13/2019      Assessment & Plan:   Problem List Items Addressed This Visit      Encounter for annual physical exam    -  Primary Annual exam as documented. Counseling done  re healthy lifestyle involving commitment to 150 minutes exercise per week, heart healthy diet, and attaining healthy weight.The importance of adequate sleep also discussed. Changes in health habits are decided on by the patient with goals and time frames  set for achieving them. Immunization and cancer screening needs are specifically  addressed at this visit.  Relevant Orders  CBC with Differential/Platelet  CMP14+EGFR  Lipid panel  HgB A1c  Vitamin D (25 hydroxy)  TSH    Cardiovascular and Mediastinum   Essential hypertension    BP Readings from Last 1 Encounters:  08/28/20 122/82   Well-controlled Counseled for compliance with the medications Advised DASH diet and ambulation as tolerated      Congestive heart failure (HCC)    Appears euvolemic On Coreg, Bidil and Lasix On Aspirin and statin for CAD F/u with Cardiology        Genitourinary   Chronic kidney disease, stage IV (severe) (HCC)    Progressive CKD stage 4 Still takes NSAIDs PRN, understands risk of kidney damage Follows up with Nephrology On Sensipar and sodium bicarb tablets  Other   Elevated PSA   Relevant Orders   PSA    Other Visit Diagnoses       Acute sinusitis, recurrence not specified, unspecified location       Relevant Medications   azithromycin (ZITHROMAX) 250 MG tablet   fluticasone (FLONASE) 50 MCG/ACT nasal spray    Impacted Cerumen R > L Ear irrigation on the right side Advised to use Debrox ear drops PRN Avoid using sharp objects for cleaning purposes  Meds ordered this encounter  Medications  . azithromycin (ZITHROMAX) 250 MG tablet    Sig: Take 2 tablets on day 1, then 1 tablet daily on days 2 through 5    Dispense:  6 tablet    Refill:  0  . fluticasone (FLONASE) 50 MCG/ACT nasal spray    Sig: Place 2 sprays into both nostrils daily.    Dispense:  16 g    Refill:  6    Follow-up: Return in about 6 months (around 02/27/2021) for CKD and HTN.    Lindell Spar, MD

## 2020-08-28 NOTE — Patient Instructions (Signed)
Please take Azithromycin for sinusitis.  Start using Flonase for sinus symptoms and allergies.  Okay to use Debrox for excess ear wax.  Please continue to take other medications as prescribed.  Please follow up with Dr Ranell Patrick for discussing pain medications.

## 2020-08-28 NOTE — Assessment & Plan Note (Signed)
Appears euvolemic On Coreg, Bidil and Lasix On Aspirin and statin for CAD F/u with Cardiology

## 2020-08-29 ENCOUNTER — Ambulatory Visit: Payer: Medicare Other | Admitting: Urology

## 2020-08-29 DIAGNOSIS — E210 Primary hyperparathyroidism: Secondary | ICD-10-CM | POA: Diagnosis not present

## 2020-08-29 DIAGNOSIS — R809 Proteinuria, unspecified: Secondary | ICD-10-CM | POA: Diagnosis not present

## 2020-08-29 DIAGNOSIS — N184 Chronic kidney disease, stage 4 (severe): Secondary | ICD-10-CM | POA: Diagnosis not present

## 2020-08-29 DIAGNOSIS — I129 Hypertensive chronic kidney disease with stage 1 through stage 4 chronic kidney disease, or unspecified chronic kidney disease: Secondary | ICD-10-CM | POA: Diagnosis not present

## 2020-08-29 DIAGNOSIS — E559 Vitamin D deficiency, unspecified: Secondary | ICD-10-CM | POA: Diagnosis not present

## 2020-08-29 DIAGNOSIS — D508 Other iron deficiency anemias: Secondary | ICD-10-CM | POA: Diagnosis not present

## 2020-08-29 LAB — CMP14+EGFR
ALT: 7 IU/L (ref 0–44)
AST: 9 IU/L (ref 0–40)
Albumin/Globulin Ratio: 1.4 (ref 1.2–2.2)
Albumin: 4.2 g/dL (ref 3.8–4.8)
Alkaline Phosphatase: 127 IU/L — ABNORMAL HIGH (ref 44–121)
BUN/Creatinine Ratio: 9 — ABNORMAL LOW (ref 10–24)
BUN: 23 mg/dL (ref 8–27)
Bilirubin Total: 0.8 mg/dL (ref 0.0–1.2)
CO2: 22 mmol/L (ref 20–29)
Calcium: 10.8 mg/dL — ABNORMAL HIGH (ref 8.6–10.2)
Chloride: 106 mmol/L (ref 96–106)
Creatinine, Ser: 2.66 mg/dL — ABNORMAL HIGH (ref 0.76–1.27)
Globulin, Total: 2.9 g/dL (ref 1.5–4.5)
Glucose: 102 mg/dL — ABNORMAL HIGH (ref 65–99)
Potassium: 4.2 mmol/L (ref 3.5–5.2)
Sodium: 141 mmol/L (ref 134–144)
Total Protein: 7.1 g/dL (ref 6.0–8.5)
eGFR: 26 mL/min/{1.73_m2} — ABNORMAL LOW (ref >59)

## 2020-08-29 LAB — LIPID PANEL
Chol/HDL Ratio: 2.4 ratio (ref 0.0–5.0)
Cholesterol, Total: 126 mg/dL (ref 100–199)
HDL: 52 mg/dL (ref >39)
LDL Chol Calc (NIH): 53 mg/dL (ref 0–99)
Triglycerides: 118 mg/dL (ref 0–149)
VLDL Cholesterol Cal: 21 mg/dL (ref 5–40)

## 2020-08-29 LAB — CBC WITH DIFFERENTIAL/PLATELET
Basophils Absolute: 0 10*3/uL (ref 0.0–0.2)
Basos: 0 %
EOS (ABSOLUTE): 0.2 10*3/uL (ref 0.0–0.4)
Eos: 2 %
Hematocrit: 35.4 % — ABNORMAL LOW (ref 37.5–51.0)
Hemoglobin: 12.1 g/dL — ABNORMAL LOW (ref 13.0–17.7)
Immature Grans (Abs): 0 10*3/uL (ref 0.0–0.1)
Immature Granulocytes: 0 %
Lymphocytes Absolute: 1.4 10*3/uL (ref 0.7–3.1)
Lymphs: 16 %
MCH: 31.6 pg (ref 26.6–33.0)
MCHC: 34.2 g/dL (ref 31.5–35.7)
MCV: 92 fL (ref 79–97)
Monocytes Absolute: 1 10*3/uL — ABNORMAL HIGH (ref 0.1–0.9)
Monocytes: 12 %
Neutrophils Absolute: 5.9 10*3/uL (ref 1.4–7.0)
Neutrophils: 70 %
Platelets: 173 10*3/uL (ref 150–450)
RBC: 3.83 x10E6/uL — ABNORMAL LOW (ref 4.14–5.80)
RDW: 13.3 % (ref 11.6–15.4)
WBC: 8.6 10*3/uL (ref 3.4–10.8)

## 2020-08-29 LAB — HEMOGLOBIN A1C
Est. average glucose Bld gHb Est-mCnc: 126 mg/dL
Hgb A1c MFr Bld: 6 % — ABNORMAL HIGH (ref 4.8–5.6)

## 2020-08-29 LAB — VITAMIN D 25 HYDROXY (VIT D DEFICIENCY, FRACTURES): Vit D, 25-Hydroxy: 14.5 ng/mL — ABNORMAL LOW (ref 30.0–100.0)

## 2020-08-29 LAB — TSH: TSH: 0.993 u[IU]/mL (ref 0.450–4.500)

## 2020-08-31 LAB — SPECIMEN STATUS REPORT

## 2020-08-31 LAB — PSA: Prostate Specific Ag, Serum: 5.4 ng/mL — ABNORMAL HIGH (ref 0.0–4.0)

## 2020-09-06 NOTE — Progress Notes (Signed)
Remote ICD transmission.   

## 2020-09-10 ENCOUNTER — Encounter: Payer: Medicare Other | Admitting: Physical Medicine and Rehabilitation

## 2020-09-23 ENCOUNTER — Telehealth: Payer: Self-pay | Admitting: Dermatology

## 2020-09-23 NOTE — Telephone Encounter (Signed)
Called to cancel appointment, EXTREMELY mad that nothing open for new patient until November. Told him if he needs something before that to try elsewhere, which made him even madder, as in "I'll have your job". Wants to talk to you.

## 2020-09-24 NOTE — Telephone Encounter (Signed)
Called and left patient a message to return my call.

## 2020-09-25 ENCOUNTER — Ambulatory Visit: Payer: Medicare Other | Admitting: Dermatology

## 2020-10-02 ENCOUNTER — Other Ambulatory Visit (HOSPITAL_COMMUNITY): Payer: Self-pay | Admitting: Internal Medicine

## 2020-10-08 ENCOUNTER — Other Ambulatory Visit (HOSPITAL_COMMUNITY): Payer: Self-pay | Admitting: Internal Medicine

## 2020-10-09 ENCOUNTER — Encounter: Payer: Self-pay | Admitting: *Deleted

## 2020-10-21 ENCOUNTER — Other Ambulatory Visit: Payer: Self-pay | Admitting: Physical Medicine and Rehabilitation

## 2020-11-09 DIAGNOSIS — R61 Generalized hyperhidrosis: Secondary | ICD-10-CM | POA: Diagnosis not present

## 2020-11-09 DIAGNOSIS — R42 Dizziness and giddiness: Secondary | ICD-10-CM | POA: Diagnosis not present

## 2020-11-11 ENCOUNTER — Telehealth (HOSPITAL_COMMUNITY): Payer: Self-pay | Admitting: *Deleted

## 2020-11-11 NOTE — Telephone Encounter (Signed)
Pt called stating since increase in bidil to 1 tab 3x daily he has been very fatigued with no energy. Pt said he experiences dizziness that comes and goes. Pt asked if he could decrease bidil to 1 tab twice daily. Per pt bp is consistently around 119/64.  Routed to Allena Katz, FNP for advice

## 2020-11-12 NOTE — Telephone Encounter (Signed)
Spoke w/pt, he states he cut his Bidil back to BID over a month ago and is still having the dizziness. He reports the dizziness mainly occurs after he gets up. His wt is stable. Will forward to provider for further recommendations

## 2020-11-12 NOTE — Telephone Encounter (Signed)
Left vm for pt to return my call.  

## 2020-11-12 NOTE — Telephone Encounter (Signed)
Pt aware and agreeable, OV sch

## 2020-11-22 ENCOUNTER — Ambulatory Visit (INDEPENDENT_AMBULATORY_CARE_PROVIDER_SITE_OTHER): Payer: Medicare Other

## 2020-11-22 DIAGNOSIS — I255 Ischemic cardiomyopathy: Secondary | ICD-10-CM | POA: Diagnosis not present

## 2020-11-25 LAB — CUP PACEART REMOTE DEVICE CHECK
Battery Remaining Longevity: 174 mo
Battery Remaining Percentage: 100 %
Brady Statistic RV Percent Paced: 0 %
Date Time Interrogation Session: 20220722045300
HighPow Impedance: 61 Ohm
Implantable Lead Implant Date: 20210422
Implantable Lead Location: 753860
Implantable Lead Model: 138
Implantable Lead Serial Number: 302700
Implantable Pulse Generator Implant Date: 20210422
Lead Channel Impedance Value: 409 Ohm
Lead Channel Setting Pacing Amplitude: 2.5 V
Lead Channel Setting Pacing Pulse Width: 0.4 ms
Lead Channel Setting Sensing Sensitivity: 0.5 mV
Pulse Gen Serial Number: 209495

## 2020-11-26 ENCOUNTER — Other Ambulatory Visit (HOSPITAL_COMMUNITY): Payer: Self-pay | Admitting: Internal Medicine

## 2020-11-27 ENCOUNTER — Other Ambulatory Visit: Payer: Self-pay | Admitting: Family Medicine

## 2020-11-27 ENCOUNTER — Encounter: Payer: Self-pay | Admitting: Internal Medicine

## 2020-11-27 ENCOUNTER — Ambulatory Visit: Payer: Medicare Other | Admitting: Internal Medicine

## 2020-11-29 ENCOUNTER — Encounter: Payer: Self-pay | Admitting: Internal Medicine

## 2020-12-12 NOTE — Progress Notes (Signed)
Remote ICD transmission.   

## 2020-12-17 ENCOUNTER — Other Ambulatory Visit: Payer: Self-pay | Admitting: Family Medicine

## 2020-12-17 ENCOUNTER — Other Ambulatory Visit: Payer: Self-pay | Admitting: Physical Medicine and Rehabilitation

## 2020-12-19 ENCOUNTER — Ambulatory Visit: Payer: Medicare Other | Admitting: Urology

## 2020-12-24 ENCOUNTER — Encounter (HOSPITAL_COMMUNITY): Payer: Medicare Other | Admitting: Internal Medicine

## 2020-12-27 ENCOUNTER — Ambulatory Visit: Payer: Medicare HMO | Admitting: Family Medicine

## 2020-12-27 ENCOUNTER — Other Ambulatory Visit: Payer: Self-pay | Admitting: Internal Medicine

## 2020-12-27 DIAGNOSIS — Z1211 Encounter for screening for malignant neoplasm of colon: Secondary | ICD-10-CM

## 2020-12-27 NOTE — Progress Notes (Signed)
Received a letter from [employer] about an expired order of Cologuard. Order placed again.

## 2020-12-28 ENCOUNTER — Other Ambulatory Visit: Payer: Self-pay | Admitting: Physical Medicine and Rehabilitation

## 2020-12-30 ENCOUNTER — Ambulatory Visit: Payer: Medicare Other

## 2020-12-31 ENCOUNTER — Ambulatory Visit (INDEPENDENT_AMBULATORY_CARE_PROVIDER_SITE_OTHER): Payer: Medicare Other

## 2020-12-31 DIAGNOSIS — Z1211 Encounter for screening for malignant neoplasm of colon: Secondary | ICD-10-CM | POA: Diagnosis not present

## 2020-12-31 DIAGNOSIS — Z0000 Encounter for general adult medical examination without abnormal findings: Secondary | ICD-10-CM

## 2020-12-31 DIAGNOSIS — Z0001 Encounter for general adult medical examination with abnormal findings: Secondary | ICD-10-CM | POA: Diagnosis not present

## 2020-12-31 NOTE — Progress Notes (Signed)
Subjective:   [name] is a 65 y.o. male who presents for Medicare Annual/Subsequent preventive examination. I connected with  [name] on 12/31/20 by a audio enabled telemedicine application and verified that I am speaking with the correct person using two identifiers.   I discussed the limitations of evaluation and management by telemedicine. The patient expressed understanding and agreed to proceed.   Location of patient:Home  Location of Provider: Office  Persons participating in virtual visit: [name] (patient) and Valli Glance Review of Systems    Defer to PCP       Objective:    There were no vitals filed for this visit. There is no height or weight on file to calculate BMI.  Advanced Directives 12/31/2020 12/27/2019 09/26/2019 09/20/2019 08/24/2019 07/24/2019 07/05/2019  Does Patient Have a Medical Advance Directive? _0  No No  Would patient like information on creating a medical advance directive? - Yes (MAU/Ambulatory/Procedural Areas - Information given) No - Patient declined - No - Patient declined - -    Current Medications (verified) Outpatient Encounter Medications as of 12/31/2020  Medication Sig   aspirin 81 MG chewable tablet Chew 1 tablet (81 mg total) by mouth daily.   atorvastatin (LIPITOR) 80 MG tablet TAKE 1 TABLET BY MOUTH DAILY AT 6PM   blood glucose meter kit and supplies Dispense based on patient and insurance preference. Use up to four times daily as directed. (FOR ICD-10 E10.9, E11.9).   BRILINTA 60 MG TABS tablet Take 1 tablet by mouth twice daily   carvedilol (COREG) 12.5 MG tablet TAKE 1 TABLET BY MOUTH TWICE DAILY WITH MEALS   cinacalcet (SENSIPAR) 30 MG tablet Take 30 mg by mouth daily.   colchicine 0.6 MG tablet Take 0.6 mg by mouth daily.   fluticasone (FLONASE) 50 MCG/ACT nasal spray Place 2 sprays into both nostrils daily.   furosemide (LASIX) 40 MG tablet Take 1 tablet (40 mg total) by mouth daily.   gabapentin  (NEURONTIN) 600 MG tablet Take 1 tablet (600 mg total) by mouth 3 (three) times daily.   HYDROcodone-acetaminophen (NORCO) 10-325 MG tablet Take 1 tablet by mouth 2 (two) times daily as needed.   ivabradine (CORLANOR) 5 MG TABS tablet Take 1 tablet (5 mg total) by mouth 2 (two) times daily with a meal.   meloxicam (MOBIC) 15 MG tablet TAKE 1 TABLET BY MOUTH ONCE A DAY.   nitroGLYCERIN (NITROSTAT) 0.4 MG SL tablet Place 1 tablet (0.4 mg total) under the tongue every 5 (five) minutes as needed.   potassium citrate (UROCIT-K) 10 MEQ (1080 MG) SR tablet Take 1 tablet by mouth daily.   TRUEplus Lancets 30G MISC Use as directed to check blood sugar daily.   Alcohol Swabs (B-D SINGLE USE SWABS REGULAR) PADS USE AS DIRECTED (Patient not taking: Reported on 12/31/2020)   allopurinol (ZYLOPRIM) 100 MG tablet Take 100 mg by mouth daily.    promethazine-dextromethorphan (PROMETHAZINE-DM) 6.25-15 MG/5ML syrup Take 5 mLs by mouth 4 (four) times daily as needed for cough. (Patient not taking: Reported on 12/31/2020)   sodium bicarbonate 650 MG tablet Take 650 mg by mouth 3 (three) times daily. (Patient not taking: Reported on 12/31/2020)   No facility-administered encounter medications on file as of 12/31/2020.    Allergies (verified) Patient has no known allergies.   History: Past Medical History:  Diagnosis Date   Acute ST elevation myocardial infarction (STEMI) due to occlusion of mid portion of left anterior descending (LAD) coronary artery ([place])  67/10/7207   Acute systolic heart failure (HCC)    Arthritis    CHF (congestive heart failure) (HCC)    CKD (chronic kidney disease) stage 4, GFR 15-29 ml/min (HCC)    Dyspnea    with exertionm periodically   Heart attack ([place]) 03/15/2019   anterior MI    History of kidney stones    Hyperparathyroidism ([place])    Hypertension 2002   Ischemic cardiomyopathy    Left knee pain    Syncope 04/04/2019   after blood draw   Vitamin D deficiency    Past Surgical  History:  Procedure Laterality Date   AMPUTATION TOE Left 1991   2 digit of left foot   CHOLECYSTECTOMY     COLONOSCOPY     CORONARY/GRAFT ACUTE MI REVASCULARIZATION N/A 03/12/2019   Procedure: Coronary/Graft Acute MI Revascularization;  Surgeon: Belva Crome, MD;  Location: [facility];  Service: Cardiovascular;  Laterality: N/A;   ICD IMPLANT N/A 08/24/2019   Procedure: ICD IMPLANT;  Surgeon: Evans Lance, MD;  Location: [facility];  Service: Cardiovascular;  Laterality: N/A;   IR URETERAL STENT LEFT NEW ACCESS W/O SEP NEPHROSTOMY CATH  09/26/2019   LEFT HEART CATH AND CORONARY ANGIOGRAPHY N/A 03/12/2019   Procedure: LEFT HEART CATH AND CORONARY ANGIOGRAPHY;  Surgeon: Belva Crome, MD;  Location: [facility];  Service: Cardiovascular;  Laterality: N/A;   LITHOTRIPSY Right    NEPHROLITHOTOMY Left 09/26/2019   Procedure: LEFT NEPHROLITHOTOMY PERCUTANEOUS;  Surgeon: Irine Seal, MD;  Location: [facility];  Service: Urology;  Laterality: Left;   Family History  Problem Relation Age of Onset   Heart failure Mother    Heart disease Mother    Cancer Mother        breast   Heart attack Father    Hypertension Father    Heart disease Father    Diabetes Brother    Social History   Socioeconomic History   Marital status: Single    Spouse name: Not on file   Number of children: 0   Years of education: Not on file   Highest education level: Some college, no degree  Occupational History   Occupation: retired  Tobacco Use   Smoking status: Former    Packs/day: 0.50    Years: 40.00    Pack years: 20.00    Types: Cigarettes    Quit date: 03/12/2019    Years since quitting: 1.8   Smokeless tobacco: Never  Vaping Use   Vaping Use: Never used  Substance and Sexual Activity   Alcohol use: No   Drug use: No   Sexual activity: Not Currently  Other Topics Concern   Not on file  Social History Narrative   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       Enjoys:  fishing, shopping, working around the house       Diet: working on changes, avoiding fried foods, increased veggies   Caffeine: teas some daily   Water: 6-8 cups daily       Wears seat belt    Does not use phone while driving    Oceanographer at home    Social Determinants of Health   Financial Resource Strain: Low Risk    Difficulty of Paying Living Expenses: Not hard at all  Food Insecurity: No Food Insecurity   Worried About Charity fundraiser in the Last Year: Never true   [place] in the Last  Year: Never true  Transportation Needs: No Transportation Needs   Lack of Transportation (Medical): No   Lack of Transportation (Non-Medical): No  Physical Activity: Insufficiently Active   Days of Exercise per Week: 7 days   Minutes of Exercise per Session: 10 min  Stress: No Stress Concern Present   Feeling of Stress : Not at all  Social Connections: Unknown   Frequency of Communication with Friends and Family: More than three times a week   Frequency of Social Gatherings with Friends and Family: More than three times a week   Attends Religious Services: More than 4 times per year   Active Member of [employer] or Organizations: No   Attends Music therapist: Never   Marital Status: Not on file    Tobacco Counseling Counseling given: Not Answered   Clinical Intake:  Pre-visit preparation completed: Yes  Pain : No/denies pain     Nutritional Risks: None Diabetes: No  How often do you need to have someone help you when you read instructions, pamphlets, or other written materials from your doctor or pharmacy?: 1 - Never What is the last grade level you completed in school?: some college  Diabetic?NO  Interpreter Needed?: No  Information entered by :: Jaymen Fetch J,CMA   Activities of Daily Living In your present state of health, do you have any difficulty performing the following activities: 12/31/2020  Hearing? N  Vision? N  Difficulty concentrating  or making decisions? N  Walking or climbing stairs? N  Dressing or bathing? N  Doing errands, shopping? N  Preparing Food and eating ? N  Using the Toilet? N  In the past six months, have you accidently leaked urine? N  Do you have problems with loss of bowel control? N  Managing your Medications? N  Managing your Finances? N  Housekeeping or managing your Housekeeping? N  Some recent data might be hidden    Patient Care Team: Lindell Spar, MD as PCP - General (Internal Medicine) Belva Crome, MD as PCP - Cardiology (Cardiology)  Indicate any recent Medical Services you may have received from other than [facility] providers in the past year (date may be approximate).     Assessment:   This is a routine wellness examination for [facility].  Hearing/Vision screen No results found.  Dietary issues and exercise activities discussed: Current Exercise Habits: Home exercise routine, Type of exercise: walking, Time (Minutes): 25, Frequency (Times/Week): 7, Weekly Exercise (Minutes/Week): 175, Intensity: Mild   Goals Addressed   None    Depression Screen PHQ 2/9 Scores 12/31/2020 08/28/2020 05/16/2020 12/27/2019 12/27/2019 07/27/2019 06/19/2019  PHQ - 2 Score 0 0 0 0 0 0 0  PHQ- 9 Score - - 0 - - - 0  Exception Documentation - - - - - - -    Fall Risk Fall Risk  12/31/2020 08/28/2020 12/27/2019 07/27/2019 04/11/2019  Falls in the past year? 0 0 0 0 0  Number falls in past yr: 0 0 0 0 0  Injury with Fall? 0 0 0 0 0  Risk for fall due to : No Fall Risks No Fall Risks No Fall Risks - -  Follow up Falls evaluation completed Falls evaluation completed Falls evaluation completed - -    FALL RISK PREVENTION PERTAINING TO THE HOME:  Any stairs in or around the home? Yes  If so, are there any without handrails? No  Home free of loose throw rugs in walkways, pet beds, electrical cords, etc? Yes  Adequate lighting in your home to reduce risk of falls? Yes   ASSISTIVE DEVICES UTILIZED TO PREVENT  FALLS:  Life alert? No  Use of a cane, walker or w/c? Yes  Grab bars in the bathroom? No  Shower chair or bench in shower? No  Elevated toilet seat or a handicapped toilet? No   TIMED UP AND GO:  Was the test performed?  N/A .  Length of time to ambulate 10 feet: N/A sec.     Cognitive Function: MMSE - Mini Mental State Exam 12/27/2019  Orientation to time 5  Orientation to Place 5  Registration 3  Attention/ Calculation 5  Recall 3  Language- name 2 objects 2  Language- repeat 1  Language- follow 3 step command 3  Language- read & follow direction 1  Write a sentence 1  Copy design 1  Total score 30     6CIT Screen 12/31/2020 12/27/2019  What Year? 0 points 0 points  What month? 0 points 0 points  What time? 0 points 0 points  Count back from 20 0 points 0 points  Months in reverse 0 points 0 points  Repeat phrase 10 points 0 points  Total Score 10 0    Immunizations Immunization History  Administered Date(s) Administered   Influenza,inj,Quad PF,6+ Mos 12/27/2019   Janssen (J&J) SARS-COV-2 Vaccination 07/23/2019    TDAP status: Due, Education has been provided regarding the importance of this vaccine. Advised may receive this vaccine at local pharmacy or Health Dept. Aware to provide a copy of the vaccination record if obtained from local pharmacy or Health Dept. Verbalized acceptance and understanding.  Flu Vaccine status: Due, Education has been provided regarding the importance of this vaccine. Advised may receive this vaccine at local pharmacy or Health Dept. Aware to provide a copy of the vaccination record if obtained from local pharmacy or Health Dept. Verbalized acceptance and understanding.  Pneumococcal vaccine status: Due, Education has been provided regarding the importance of this vaccine. Advised may receive this vaccine at local pharmacy or Health Dept. Aware to provide a copy of the vaccination record if obtained from local pharmacy or Health Dept.  Verbalized acceptance and understanding.  Covid-19 vaccine status: Information provided on how to obtain vaccines.   Qualifies for Shingles Vaccine? Yes   Zostavax completed No   Shingrix Completed?: No.    Education has been provided regarding the importance of this vaccine. Patient has been advised to call insurance company to determine out of pocket expense if they have not yet received this vaccine. Advised may also receive vaccine at local pharmacy or Health Dept. Verbalized acceptance and understanding.  Screening Tests Health Maintenance  Topic Date Due   Pneumococcal Vaccine 10-31 Years old (1 - PCV) Never done   Hepatitis C Screening  Never done   TETANUS/TDAP  Never done   Zoster Vaccines- Shingrix (1 of 2) Never done   COLONOSCOPY (Pts 45-54yr Insurance coverage will need to be confirmed)  Never done   COVID-19 Vaccine (2 - Janssen risk series) 08/20/2019   INFLUENZA VACCINE  12/02/2020   HIV Screening  Completed   HPV VACCINES  Aged Out    Health Maintenance  Health Maintenance Due  Topic Date Due   Pneumococcal Vaccine 047683Years old (1 - PCV) Never done   Hepatitis C Screening  Never done   TETANUS/TDAP  Never done   Zoster Vaccines- Shingrix (1 of 2) Never done   COLONOSCOPY (Pts 45-479yrInsurance coverage will need  to be confirmed)  Never done   COVID-19 Vaccine (2 - Janssen risk series) 08/20/2019   INFLUENZA VACCINE  12/02/2020    Colorectal cancer screening: Referral to GI placed 12/31/2020. Pt aware the office will call re: appt.  Lung Cancer Screening: (Low Dose CT Chest recommended if Age 65-80 years, 30 pack-year currently smoking OR have quit w/in 15years.) does qualify.   Lung Cancer Screening Referral: no  Additional Screening:  Hepatitis C Screening: does qualify; Not Completed   Vision Screening: Recommended annual ophthalmology exams for early detection of glaucoma and other disorders of the eye. Is the patient up to date with their annual  eye exam?  Yes  Who is the provider or what is the name of the office in which the patient attends annual eye exams? Dr. Delman Cheadle If pt is not established with a provider, would they like to be referred to a provider to establish care? No .   Dental Screening: Recommended annual dental exams for proper oral hygiene  Community Resource Referral / Chronic Care Management: CRR required this visit?  No   CCM required this visit?  No      Plan:     I have personally reviewed and noted the following in the patient's chart:   Medical and social history Use of alcohol, tobacco or illicit drugs  Current medications and supplements including opioid prescriptions. Patient is currently taking opioid prescriptions. Information provided to patient regarding non-opioid alternatives. Patient advised to discuss non-opioid treatment plan with their provider. Functional ability and status Nutritional status Physical activity Advanced directives List of other physicians Hospitalizations, surgeries, and ER visits in previous 12 months Vitals Screenings to include cognitive, depression, and falls Referrals and appointments  In addition, I have reviewed and discussed with patient certain preventive protocols, quality metrics, and best practice recommendations. A written personalized care plan for preventive services as well as general preventive health recommendations were provided to patient.     Edgar Frisk, [place]   12/31/2020   Nurse Notes: Non Face to Face 30 minute visit Encounter.   Mr. Beckel , Thank you for taking time to come for your Medicare Wellness Visit. I appreciate your ongoing commitment to your health goals. Please review the following plan we discussed and let me know if I can assist you in the future.   These are the goals we discussed:  Goals      Weight (lb) < 200 lb (90.7 kg)     Would like to get down below the 200lbs        This is a list of the screening  recommended for you and due dates:  Health Maintenance  Topic Date Due   Pneumococcal Vaccination (1 - PCV) Never done   Hepatitis C Screening: USPSTF Recommendation to screen - Ages 81-79 yo.  Never done   Tetanus Vaccine  Never done   Zoster (Shingles) Vaccine (1 of 2) Never done   Colon Cancer Screening  Never done   COVID-19 Vaccine (2 - Janssen risk series) 08/20/2019   Flu Shot  12/02/2020   HIV Screening  Completed   HPV Vaccine  Aged Out    Patient has been advised that a referral was placed to [facility] to have colon screening and he'd be contacted to schedule appointment.

## 2020-12-31 NOTE — Patient Instructions (Signed)
Health Maintenance, Male Adopting a healthy lifestyle and getting preventive care are important in promoting health and wellness. Ask your health care provider about: The right schedule for you to have regular tests and exams. Things you can do on your own to prevent diseases and keep yourself healthy. What should I know about diet, weight, and exercise? Eat a healthy diet  Eat a diet that includes plenty of vegetables, fruits, low-fat dairy products, and lean protein. Do not eat a lot of foods that are high in solid fats, added sugars, or sodium.  Maintain a healthy weight Body mass index (BMI) is a measurement that can be used to identify possible weight problems. It estimates body fat based on height and weight. Your health care provider can help determine your BMI and help you achieve or maintain ahealthy weight. Get regular exercise Get regular exercise. This is one of the most important things you can do for your health. Most adults should: Exercise for at least 150 minutes each week. The exercise should increase your heart rate and make you sweat (moderate-intensity exercise). Do strengthening exercises at least twice a week. This is in addition to the moderate-intensity exercise. Spend less time sitting. Even light physical activity can be beneficial. Watch cholesterol and blood lipids Have your blood tested for lipids and cholesterol at 63 years of age, then havethis test every 5 years. You may need to have your cholesterol levels checked more often if: Your lipid or cholesterol levels are high. You are older than 63 years of age. You are at high risk for heart disease. What should I know about cancer screening? Many types of cancers can be detected early and may often be prevented. Depending on your health history and family history, you may need to have cancer screening at various ages. This may include screening for: Colorectal cancer. Prostate cancer. Skin cancer. Lung  cancer. What should I know about heart disease, diabetes, and high blood pressure? Blood pressure and heart disease High blood pressure causes heart disease and increases the risk of stroke. This is more likely to develop in people who have high blood pressure readings, are of African descent, or are overweight. Talk with your health care provider about your target blood pressure readings. Have your blood pressure checked: Every 3-5 years if you are 18-39 years of age. Every year if you are 40 years old or older. If you are between the ages of 65 and 75 and are a current or former smoker, ask your health care provider if you should have a one-time screening for abdominal aortic aneurysm (AAA). Diabetes Have regular diabetes screenings. This checks your fasting blood sugar level. Have the screening done: Once every three years after age 45 if you are at a normal weight and have a low risk for diabetes. More often and at a younger age if you are overweight or have a high risk for diabetes. What should I know about preventing infection? Hepatitis B If you have a higher risk for hepatitis B, you should be screened for this virus. Talk with your health care provider to find out if you are at risk forhepatitis B infection. Hepatitis C Blood testing is recommended for: Everyone born from 1945 through 1965. Anyone with known risk factors for hepatitis C. Sexually transmitted infections (STIs) You should be screened each year for STIs, including gonorrhea and chlamydia, if: You are sexually active and are younger than 63 years of age. You are older than 63 years of age   and your health care provider tells you that you are at risk for this type of infection. Your sexual activity has changed since you were last screened, and you are at increased risk for chlamydia or gonorrhea. Ask your health care provider if you are at risk. Ask your health care provider about whether you are at high risk for HIV.  Your health care provider may recommend a prescription medicine to help prevent HIV infection. If you choose to take medicine to prevent HIV, you should first get tested for HIV. You should then be tested every 3 months for as long as you are taking the medicine. Follow these instructions at home: Lifestyle Do not use any products that contain nicotine or tobacco, such as cigarettes, e-cigarettes, and chewing tobacco. If you need help quitting, ask your health care provider. Do not use street drugs. Do not share needles. Ask your health care provider for help if you need support or information about quitting drugs. Alcohol use Do not drink alcohol if your health care provider tells you not to drink. If you drink alcohol: Limit how much you have to 0-2 drinks a day. Be aware of how much alcohol is in your drink. In the [place] one drink equals one 12 oz bottle of beer (355 mL), one 5 oz glass of wine (148 mL), or one 1 oz glass of hard liquor (44 mL). General instructions Schedule regular health, dental, and eye exams. Stay current with your vaccines. Tell your health care provider if: You often feel depressed. You have ever been abused or do not feel safe at home. Summary Adopting a healthy lifestyle and getting preventive care are important in promoting health and wellness. Follow your health care provider's instructions about healthy diet, exercising, and getting tested or screened for diseases. Follow your health care provider's instructions on monitoring your cholesterol and blood pressure. This information is not intended to replace advice given to you by your health care provider. Make sure you discuss any questions you have with your healthcare provider. Document Revised: 04/13/2018 Document Reviewed: 04/13/2018 Elsevier Patient Education  2022 [employer] Inc[employer]  

## 2021-01-08 ENCOUNTER — Encounter: Payer: Medicare Other | Admitting: Internal Medicine

## 2021-01-09 ENCOUNTER — Ambulatory Visit: Payer: Medicare Other | Admitting: Internal Medicine

## 2021-01-16 ENCOUNTER — Ambulatory Visit: Payer: Medicare Other | Admitting: Internal Medicine

## 2021-01-21 ENCOUNTER — Other Ambulatory Visit: Payer: Self-pay | Admitting: Physical Medicine and Rehabilitation

## 2021-01-21 ENCOUNTER — Other Ambulatory Visit: Payer: Self-pay | Admitting: Cardiology

## 2021-01-23 ENCOUNTER — Ambulatory Visit: Payer: Medicare Other | Admitting: Internal Medicine

## 2021-02-13 ENCOUNTER — Telehealth: Payer: Self-pay | Admitting: Internal Medicine

## 2021-02-13 NOTE — Telephone Encounter (Signed)
Pt is calling wants blood work --states he is tired

## 2021-02-13 NOTE — Telephone Encounter (Signed)
Pt has an appt with patel 02-27-21 will be ordered at that visit if provider would like to

## 2021-02-20 ENCOUNTER — Ambulatory Visit: Payer: Medicare Other | Admitting: Urology

## 2021-02-20 DIAGNOSIS — N200 Calculus of kidney: Secondary | ICD-10-CM

## 2021-02-20 DIAGNOSIS — N281 Cyst of kidney, acquired: Secondary | ICD-10-CM

## 2021-02-21 ENCOUNTER — Ambulatory Visit (INDEPENDENT_AMBULATORY_CARE_PROVIDER_SITE_OTHER): Payer: Medicare Other

## 2021-02-21 DIAGNOSIS — I255 Ischemic cardiomyopathy: Secondary | ICD-10-CM | POA: Diagnosis not present

## 2021-02-22 ENCOUNTER — Encounter (HOSPITAL_COMMUNITY): Payer: Self-pay

## 2021-02-22 ENCOUNTER — Emergency Department (HOSPITAL_COMMUNITY): Payer: Medicare Other

## 2021-02-22 ENCOUNTER — Inpatient Hospital Stay (HOSPITAL_COMMUNITY)
Admission: EM | Admit: 2021-02-22 | Discharge: 2021-02-27 | DRG: 811 | Disposition: A | Discharge status: Home / Self Care | Payer: Medicare Other | Attending: Internal Medicine | Admitting: Internal Medicine

## 2021-02-22 ENCOUNTER — Other Ambulatory Visit: Payer: Self-pay

## 2021-02-22 DIAGNOSIS — K921 Melena: Secondary | ICD-10-CM | POA: Diagnosis not present

## 2021-02-22 DIAGNOSIS — E1121 Type 2 diabetes mellitus with diabetic nephropathy: Secondary | ICD-10-CM | POA: Diagnosis not present

## 2021-02-22 DIAGNOSIS — R7303 Prediabetes: Secondary | ICD-10-CM | POA: Insufficient documentation

## 2021-02-22 DIAGNOSIS — N179 Acute kidney failure, unspecified: Secondary | ICD-10-CM | POA: Diagnosis not present

## 2021-02-22 DIAGNOSIS — I2510 Atherosclerotic heart disease of native coronary artery without angina pectoris: Secondary | ICD-10-CM | POA: Diagnosis not present

## 2021-02-22 DIAGNOSIS — Z7902 Long term (current) use of antithrombotics/antiplatelets: Secondary | ICD-10-CM

## 2021-02-22 DIAGNOSIS — K2980 Duodenitis without bleeding: Secondary | ICD-10-CM | POA: Diagnosis not present

## 2021-02-22 DIAGNOSIS — K5730 Diverticulosis of large intestine without perforation or abscess without bleeding: Secondary | ICD-10-CM | POA: Diagnosis not present

## 2021-02-22 DIAGNOSIS — E785 Hyperlipidemia, unspecified: Secondary | ICD-10-CM | POA: Diagnosis present

## 2021-02-22 DIAGNOSIS — Z8249 Family history of ischemic heart disease and other diseases of the circulatory system: Secondary | ICD-10-CM

## 2021-02-22 DIAGNOSIS — Z95810 Presence of automatic (implantable) cardiac defibrillator: Secondary | ICD-10-CM

## 2021-02-22 DIAGNOSIS — E213 Hyperparathyroidism, unspecified: Secondary | ICD-10-CM | POA: Diagnosis present

## 2021-02-22 DIAGNOSIS — K644 Residual hemorrhoidal skin tags: Secondary | ICD-10-CM | POA: Diagnosis not present

## 2021-02-22 DIAGNOSIS — Z833 Family history of diabetes mellitus: Secondary | ICD-10-CM

## 2021-02-22 DIAGNOSIS — N209 Urinary calculus, unspecified: Secondary | ICD-10-CM | POA: Diagnosis not present

## 2021-02-22 DIAGNOSIS — Z89422 Acquired absence of other left toe(s): Secondary | ICD-10-CM

## 2021-02-22 DIAGNOSIS — R1012 Left upper quadrant pain: Secondary | ICD-10-CM

## 2021-02-22 DIAGNOSIS — K2981 Duodenitis with bleeding: Secondary | ICD-10-CM | POA: Diagnosis not present

## 2021-02-22 DIAGNOSIS — K2971 Gastritis, unspecified, with bleeding: Secondary | ICD-10-CM | POA: Diagnosis not present

## 2021-02-22 DIAGNOSIS — K5790 Diverticulosis of intestine, part unspecified, without perforation or abscess without bleeding: Secondary | ICD-10-CM | POA: Diagnosis present

## 2021-02-22 DIAGNOSIS — I5022 Chronic systolic (congestive) heart failure: Secondary | ICD-10-CM | POA: Diagnosis not present

## 2021-02-22 DIAGNOSIS — I130 Hypertensive heart and chronic kidney disease with heart failure and stage 1 through stage 4 chronic kidney disease, or unspecified chronic kidney disease: Secondary | ICD-10-CM | POA: Diagnosis present

## 2021-02-22 DIAGNOSIS — I700 Atherosclerosis of aorta: Secondary | ICD-10-CM | POA: Diagnosis not present

## 2021-02-22 DIAGNOSIS — D509 Iron deficiency anemia, unspecified: Principal | ICD-10-CM | POA: Diagnosis present

## 2021-02-22 DIAGNOSIS — Z7982 Long term (current) use of aspirin: Secondary | ICD-10-CM

## 2021-02-22 DIAGNOSIS — K257 Chronic gastric ulcer without hemorrhage or perforation: Secondary | ICD-10-CM | POA: Diagnosis present

## 2021-02-22 DIAGNOSIS — D631 Anemia in chronic kidney disease: Secondary | ICD-10-CM | POA: Diagnosis present

## 2021-02-22 DIAGNOSIS — Z23 Encounter for immunization: Secondary | ICD-10-CM

## 2021-02-22 DIAGNOSIS — I10 Essential (primary) hypertension: Secondary | ICD-10-CM | POA: Diagnosis not present

## 2021-02-22 DIAGNOSIS — D649 Anemia, unspecified: Secondary | ICD-10-CM | POA: Diagnosis not present

## 2021-02-22 DIAGNOSIS — Z20822 Contact with and (suspected) exposure to covid-19: Secondary | ICD-10-CM | POA: Diagnosis present

## 2021-02-22 DIAGNOSIS — K2970 Gastritis, unspecified, without bleeding: Secondary | ICD-10-CM | POA: Diagnosis not present

## 2021-02-22 DIAGNOSIS — N184 Chronic kidney disease, stage 4 (severe): Secondary | ICD-10-CM | POA: Diagnosis present

## 2021-02-22 DIAGNOSIS — Z791 Long term (current) use of non-steroidal anti-inflammatories (NSAID): Secondary | ICD-10-CM

## 2021-02-22 DIAGNOSIS — E1122 Type 2 diabetes mellitus with diabetic chronic kidney disease: Secondary | ICD-10-CM | POA: Diagnosis not present

## 2021-02-22 DIAGNOSIS — Z79899 Other long term (current) drug therapy: Secondary | ICD-10-CM

## 2021-02-22 DIAGNOSIS — R109 Unspecified abdominal pain: Secondary | ICD-10-CM | POA: Diagnosis not present

## 2021-02-22 DIAGNOSIS — K259 Gastric ulcer, unspecified as acute or chronic, without hemorrhage or perforation: Secondary | ICD-10-CM | POA: Diagnosis not present

## 2021-02-22 DIAGNOSIS — I255 Ischemic cardiomyopathy: Secondary | ICD-10-CM | POA: Diagnosis not present

## 2021-02-22 DIAGNOSIS — R0602 Shortness of breath: Secondary | ICD-10-CM | POA: Diagnosis not present

## 2021-02-22 DIAGNOSIS — N400 Enlarged prostate without lower urinary tract symptoms: Secondary | ICD-10-CM | POA: Diagnosis present

## 2021-02-22 DIAGNOSIS — M1990 Unspecified osteoarthritis, unspecified site: Secondary | ICD-10-CM | POA: Diagnosis present

## 2021-02-22 DIAGNOSIS — I878 Other specified disorders of veins: Secondary | ICD-10-CM | POA: Diagnosis not present

## 2021-02-22 DIAGNOSIS — I252 Old myocardial infarction: Secondary | ICD-10-CM | POA: Diagnosis not present

## 2021-02-22 DIAGNOSIS — N3289 Other specified disorders of bladder: Secondary | ICD-10-CM | POA: Diagnosis not present

## 2021-02-22 DIAGNOSIS — Z87891 Personal history of nicotine dependence: Secondary | ICD-10-CM

## 2021-02-22 DIAGNOSIS — I509 Heart failure, unspecified: Secondary | ICD-10-CM

## 2021-02-22 DIAGNOSIS — Z955 Presence of coronary angioplasty implant and graft: Secondary | ICD-10-CM

## 2021-02-22 DIAGNOSIS — I517 Cardiomegaly: Secondary | ICD-10-CM | POA: Diagnosis not present

## 2021-02-22 DIAGNOSIS — Z9049 Acquired absence of other specified parts of digestive tract: Secondary | ICD-10-CM

## 2021-02-22 LAB — ABO/RH: ABO/RH(D): A POS

## 2021-02-22 LAB — RETICULOCYTES
Immature Retic Fract: 31.2 % — ABNORMAL HIGH (ref 2.3–15.9)
RBC.: 1.67 MIL/uL — ABNORMAL LOW (ref 4.22–5.81)
Retic Count, Absolute: 51.1 10*3/uL (ref 19.0–186.0)
Retic Ct Pct: 3.1 % (ref 0.4–3.1)

## 2021-02-22 LAB — CBC WITH DIFFERENTIAL/PLATELET
Abs Immature Granulocytes: 0.04 10*3/uL (ref 0.00–0.07)
Basophils Absolute: 0 10*3/uL (ref 0.0–0.1)
Basophils Relative: 0 %
Eosinophils Absolute: 0.1 10*3/uL (ref 0.0–0.5)
Eosinophils Relative: 2 %
HCT: 15.7 % — ABNORMAL LOW (ref 39.0–52.0)
Hemoglobin: 4.6 g/dL — CL (ref 13.0–17.0)
Immature Granulocytes: 1 %
Lymphocytes Relative: 13 %
Lymphs Abs: 0.9 10*3/uL (ref 0.7–4.0)
MCH: 27.2 pg (ref 26.0–34.0)
MCHC: 29.3 g/dL — ABNORMAL LOW (ref 30.0–36.0)
MCV: 92.9 fL (ref 80.0–100.0)
Monocytes Absolute: 0.4 10*3/uL (ref 0.1–1.0)
Monocytes Relative: 6 %
Neutro Abs: 5.7 10*3/uL (ref 1.7–7.7)
Neutrophils Relative %: 78 %
Platelets: 335 10*3/uL (ref 150–400)
RBC: 1.69 MIL/uL — ABNORMAL LOW (ref 4.22–5.81)
RDW: 16.8 % — ABNORMAL HIGH (ref 11.5–15.5)
WBC: 7.2 10*3/uL (ref 4.0–10.5)
nRBC: 0 % (ref 0.0–0.2)

## 2021-02-22 LAB — COMPREHENSIVE METABOLIC PANEL
ALT: 11 U/L (ref 0–44)
AST: 23 U/L (ref 15–41)
Albumin: 3.6 g/dL (ref 3.5–5.0)
Alkaline Phosphatase: 106 U/L (ref 38–126)
Anion gap: 7 (ref 5–15)
BUN: 37 mg/dL — ABNORMAL HIGH (ref 8–23)
CO2: 20 mmol/L — ABNORMAL LOW (ref 22–32)
Calcium: 9 mg/dL (ref 8.9–10.3)
Chloride: 111 mmol/L (ref 98–111)
Creatinine, Ser: 4.18 mg/dL — ABNORMAL HIGH (ref 0.61–1.24)
GFR, Estimated: 15 mL/min — ABNORMAL LOW (ref >60)
Glucose, Bld: 142 mg/dL — ABNORMAL HIGH (ref 70–99)
Potassium: 4.1 mmol/L (ref 3.5–5.1)
Sodium: 138 mmol/L (ref 135–145)
Total Bilirubin: 0.9 mg/dL (ref 0.3–1.2)
Total Protein: 7.1 g/dL (ref 6.5–8.1)

## 2021-02-22 LAB — URINALYSIS, ROUTINE W REFLEX MICROSCOPIC
Bilirubin Urine: NEGATIVE
Glucose, UA: NEGATIVE mg/dL
Hgb urine dipstick: NEGATIVE
Ketones, ur: NEGATIVE mg/dL
Leukocytes,Ua: NEGATIVE
Nitrite: NEGATIVE
Protein, ur: NEGATIVE mg/dL
Specific Gravity, Urine: 1.012 (ref 1.005–1.030)
pH: 5 (ref 5.0–8.0)

## 2021-02-22 LAB — TROPONIN I (HIGH SENSITIVITY)
Troponin I (High Sensitivity): 9 ng/L (ref <18)
Troponin I (High Sensitivity): 8 ng/L (ref <18)

## 2021-02-22 LAB — RESP PANEL BY RT-PCR (FLU A&B, COVID) ARPGX2
Influenza A by PCR: NEGATIVE
Influenza B by PCR: NEGATIVE
SARS Coronavirus 2 by RT PCR: NEGATIVE

## 2021-02-22 LAB — FOLATE: Folate: 8.6 ng/mL (ref >5.9)

## 2021-02-22 LAB — BRAIN NATRIURETIC PEPTIDE: B Natriuretic Peptide: 157 pg/mL — ABNORMAL HIGH (ref 0.0–100.0)

## 2021-02-22 LAB — LIPASE, BLOOD: Lipase: 45 U/L (ref 11–51)

## 2021-02-22 LAB — IRON AND TIBC
Iron: 8 ug/dL — ABNORMAL LOW (ref 45–182)
Saturation Ratios: 2 % — ABNORMAL LOW (ref 17.9–39.5)
TIBC: 345 ug/dL (ref 250–450)
UIBC: 337 ug/dL

## 2021-02-22 LAB — FERRITIN: Ferritin: 5 ng/mL — ABNORMAL LOW (ref 24–336)

## 2021-02-22 LAB — POC OCCULT BLOOD, ED: Fecal Occult Bld: NEGATIVE

## 2021-02-22 LAB — PREPARE RBC (CROSSMATCH)

## 2021-02-22 LAB — D-DIMER, QUANTITATIVE: D-Dimer, Quant: 2.83 ug/mL-FEU — ABNORMAL HIGH (ref 0.00–0.50)

## 2021-02-22 LAB — VITAMIN B12: Vitamin B-12: 283 pg/mL (ref 180–914)

## 2021-02-22 MED ORDER — ATORVASTATIN CALCIUM 40 MG PO TABS
80.0000 mg | ORAL_TABLET | Freq: Every evening | ORAL | Status: DC
  Administered 2021-02-22 – 2021-02-26 (×5): 80 mg via ORAL
  Filled 2021-02-22 (×5): qty 2

## 2021-02-22 MED ORDER — SODIUM CHLORIDE 0.9 % IV SOLN
10.0000 mL/h | Freq: Once | INTRAVENOUS | Status: AC
  Administered 2021-02-22: 10 mL/h via INTRAVENOUS

## 2021-02-22 MED ORDER — ONDANSETRON HCL 4 MG/2ML IJ SOLN
4.0000 mg | Freq: Four times a day (QID) | INTRAMUSCULAR | Status: DC | PRN
  Administered 2021-02-22 – 2021-02-23 (×2): 4 mg via INTRAVENOUS
  Filled 2021-02-22 (×2): qty 2

## 2021-02-22 MED ORDER — HYDROCODONE-ACETAMINOPHEN 10-325 MG PO TABS
1.0000 | ORAL_TABLET | Freq: Two times a day (BID) | ORAL | Status: DC | PRN
  Administered 2021-02-22 – 2021-02-24 (×4): 1 via ORAL
  Filled 2021-02-22 (×6): qty 1

## 2021-02-22 MED ORDER — GABAPENTIN 300 MG PO CAPS
600.0000 mg | ORAL_CAPSULE | Freq: Three times a day (TID) | ORAL | Status: DC
  Administered 2021-02-22 – 2021-02-27 (×13): 600 mg via ORAL
  Filled 2021-02-22 (×14): qty 2

## 2021-02-22 MED ORDER — POTASSIUM CITRATE ER 10 MEQ (1080 MG) PO TBCR
10.0000 meq | EXTENDED_RELEASE_TABLET | Freq: Every day | ORAL | Status: DC
  Administered 2021-02-23 – 2021-02-27 (×5): 10 meq via ORAL
  Filled 2021-02-22 (×6): qty 1

## 2021-02-22 MED ORDER — ONDANSETRON HCL 4 MG PO TABS: 4.0000 mg | ORAL_TABLET | Freq: Four times a day (QID) | ORAL | Status: DC | PRN

## 2021-02-22 MED ORDER — DICYCLOMINE HCL 10 MG PO CAPS: 10.0000 mg | ORAL_CAPSULE | Freq: Three times a day (TID) | ORAL | Status: DC | PRN

## 2021-02-22 MED ORDER — GABAPENTIN 600 MG PO TABS
600.0000 mg | ORAL_TABLET | Freq: Three times a day (TID) | ORAL | Status: DC
  Filled 2021-02-22 (×6): qty 1

## 2021-02-22 MED ORDER — FUROSEMIDE 40 MG PO TABS
40.0000 mg | ORAL_TABLET | Freq: Every day | ORAL | Status: DC
  Administered 2021-02-23 – 2021-02-27 (×5): 40 mg via ORAL
  Filled 2021-02-22 (×6): qty 1

## 2021-02-22 MED ORDER — IVABRADINE HCL 5 MG PO TABS
5.0000 mg | ORAL_TABLET | Freq: Two times a day (BID) | ORAL | Status: DC
  Administered 2021-02-23 – 2021-02-27 (×9): 5 mg via ORAL
  Filled 2021-02-22 (×12): qty 1

## 2021-02-22 MED ORDER — FLUTICASONE PROPIONATE 50 MCG/ACT NA SUSP: 2.0000 | Freq: Every day | NASAL | Status: DC

## 2021-02-22 MED ORDER — ACETAMINOPHEN 325 MG PO TABS
650.0000 mg | ORAL_TABLET | Freq: Once | ORAL | Status: AC
  Administered 2021-02-22: 650 mg via ORAL
  Filled 2021-02-22: qty 2

## 2021-02-22 MED ORDER — OXYCODONE HCL 5 MG PO TABS
5.0000 mg | ORAL_TABLET | Freq: Once | ORAL | Status: AC
  Administered 2021-02-22: 5 mg via ORAL
  Filled 2021-02-22: qty 1

## 2021-02-22 MED ORDER — CARVEDILOL 12.5 MG PO TABS
12.5000 mg | ORAL_TABLET | Freq: Two times a day (BID) | ORAL | Status: DC
  Administered 2021-02-22 – 2021-02-27 (×9): 12.5 mg via ORAL
  Filled 2021-02-22 (×10): qty 1

## 2021-02-22 MED ORDER — SODIUM CHLORIDE 0.9% IV SOLUTION: Freq: Once | INTRAVENOUS | Status: AC

## 2021-02-22 MED ORDER — CINACALCET HCL 30 MG PO TABS
30.0000 mg | ORAL_TABLET | Freq: Every day | ORAL | Status: DC
  Administered 2021-02-23 – 2021-02-26 (×4): 30 mg via ORAL
  Filled 2021-02-22 (×4): qty 1

## 2021-02-22 MED ORDER — PANTOPRAZOLE SODIUM 40 MG IV SOLR
40.0000 mg | Freq: Once | INTRAVENOUS | Status: AC
  Administered 2021-02-22: 40 mg via INTRAVENOUS
  Filled 2021-02-22: qty 40

## 2021-02-22 NOTE — ED Notes (Signed)
Tolerating blood well.  C/o to left lateral abdomen.  Rating pain 10/10.

## 2021-02-22 NOTE — H&P (Signed)
History and Physical  [name] ION:[id number] DOB: May 29, 1955 DOA: 02/22/2021  Referring physician: Kem Parkinson, PA-C, EDP PCP: Lindell Spar, MD  Outpatient Specialists:   Patient Coming From: home  Chief Complaint: SOB, weakness  HPI: [name] is a 65 y.o. male with a history of hypertension, coronary artery disease, diabetes, chronic kidney disease, HFrEF with history of ischemic cardiomyopathy and EF of 20 to 25% on echocardiogram 07/2019.  Patient seen for worsening shortness of breath, weakness, fatigue over the past couple of weeks.  Worse with moving around and improved with rest.  He does report darker stools, but no frank melena or blood in stools.  Has not been taking large amounts of NSAIDs and does not drink alcohol.  Diet and appetite have been normal.  No other palliating or provoking factors.  He does complain of abdominal pain that is in his left upper abdomen abdomen, which started earlier today.  Denies diarrhea, constipation.  Emergency Department Course: Hemoglobin 6.  Creatinine 4.18. CT abdomen shows nephrolithiasis without obstruction. Troponins negative. BNP 157  Review of Systems:   Pt denies any fevers, chills, nausea, vomiting, diarrhea, constipation, abdominal pain, palpitations, headache, vision changes, lightheadedness, dizziness, melena, rectal bleeding.  Review of systems are otherwise negative  Past Medical History:  Diagnosis Date   Acute ST elevation myocardial infarction (STEMI) due to occlusion of mid portion of left anterior descending (LAD) coronary artery ([place]) 24/08/100   Acute systolic heart failure (HCC)    Arthritis    CHF (congestive heart failure) (HCC)    CKD (chronic kidney disease) stage 4, GFR 15-29 ml/min (HCC)    Dyspnea    with exertionm periodically   Heart attack ([place]) 03/15/2019   anterior MI    History of kidney stones    Hyperparathyroidism ([place])    Hypertension 2002   Ischemic cardiomyopathy    Left  knee pain    Syncope 04/04/2019   after blood draw   Vitamin D deficiency    Past Surgical History:  Procedure Laterality Date   AMPUTATION TOE Left 1991   2 digit of left foot   CHOLECYSTECTOMY     COLONOSCOPY     CORONARY/GRAFT ACUTE MI REVASCULARIZATION N/A 03/12/2019   Procedure: Coronary/Graft Acute MI Revascularization;  Surgeon: Belva Crome, MD;  Location: [facility];  Service: Cardiovascular;  Laterality: N/A;   ICD IMPLANT N/A 08/24/2019   Procedure: ICD IMPLANT;  Surgeon: Evans Lance, MD;  Location: [facility];  Service: Cardiovascular;  Laterality: N/A;   IR URETERAL STENT LEFT NEW ACCESS W/O SEP NEPHROSTOMY CATH  09/26/2019   LEFT HEART CATH AND CORONARY ANGIOGRAPHY N/A 03/12/2019   Procedure: LEFT HEART CATH AND CORONARY ANGIOGRAPHY;  Surgeon: Belva Crome, MD;  Location: [facility];  Service: Cardiovascular;  Laterality: N/A;   LITHOTRIPSY Right    NEPHROLITHOTOMY Left 09/26/2019   Procedure: LEFT NEPHROLITHOTOMY PERCUTANEOUS;  Surgeon: Irine Seal, MD;  Location: [facility];  Service: Urology;  Laterality: Left;   Social History:  reports that he quit smoking about 1 years ago. His smoking use included cigarettes. He has a 20.00 pack-year smoking history. He has never used smokeless tobacco. He reports that he does not drink alcohol and does not use drugs. Patient lives at home  No Known Allergies  Family History  Problem Relation Age of Onset   Heart failure Mother    Heart disease Mother    Cancer Mother  breast   Heart attack Father    Hypertension Father    Heart disease Father    Diabetes Brother       Prior to Admission medications   Medication Sig Start Date End Date Taking? Authorizing Provider  allopurinol (ZYLOPRIM) 100 MG tablet Take 1 tablet by mouth daily. 01/22/21  Yes [provider]  Alcohol Swabs (B-D SINGLE USE SWABS REGULAR) PADS USE AS DIRECTED Patient not taking: Reported on 12/31/2020 05/02/20    Fayrene Helper, MD  allopurinol (ZYLOPRIM) 100 MG tablet Take 100 mg by mouth daily.  04/25/19 06/27/20  [provider]  aspirin 81 MG chewable tablet Chew 1 tablet (81 mg total) by mouth daily. 03/16/19   Cheryln Manly, NP  atorvastatin (LIPITOR) 80 MG tablet TAKE 1 TABLET BY MOUTH DAILY AT 6PM Patient taking differently: Take 80 mg by mouth daily. 12/17/20   Fayrene Helper, MD  blood glucose meter kit and supplies Dispense based on patient and insurance preference. Use up to four times daily as directed. (FOR ICD-10 E10.9, E11.9). 03/04/20   Perlie Mayo, NP  BRILINTA 60 MG TABS tablet Take 1 tablet by mouth twice daily 10/02/20   Bensimhon, Shaune Pascal, MD  carvedilol (COREG) 12.5 MG tablet TAKE 1 TABLET BY MOUTH TWICE DAILY WITH MEALS 11/26/20   Bensimhon, Shaune Pascal, MD  cinacalcet (SENSIPAR) 30 MG tablet Take 30 mg by mouth daily. 12/28/20   [provider]  colchicine 0.6 MG tablet Take 0.6 mg by mouth daily. 05/15/20   [provider]  fluticasone (FLONASE) 50 MCG/ACT nasal spray Place 2 sprays into both nostrils daily. 08/28/20   Lindell Spar, MD  furosemide (LASIX) 40 MG tablet Take 1 tablet (40 mg total) by mouth daily. 10/10/20   Bensimhon, Shaune Pascal, MD  gabapentin (NEURONTIN) 600 MG tablet Take 1 tablet (600 mg total) by mouth 3 (three) times daily. 08/22/20 08/22/21  Raulkar, Clide Deutscher, MD  HYDROcodone-acetaminophen (NORCO) 10-325 MG tablet Take 1 tablet by mouth 2 (two) times daily as needed. 03/12/20   Mcarthur Rossetti, MD  ivabradine (CORLANOR) 5 MG TABS tablet Take 1 tablet (5 mg total) by mouth 2 (two) times daily with a meal. 06/03/20   Rosita Fire, Brittainy M, PA-C  meloxicam (MOBIC) 15 MG tablet TAKE 1 TABLET BY MOUTH ONCE A DAY. 01/21/21   Raulkar, Clide Deutscher, MD  nitroGLYCERIN (NITROSTAT) 0.4 MG SL tablet Place 1 tablet (0.4 mg total) under the tongue every 5 (five) minutes as needed. 04/11/19   Corum, Rex Kras, MD  potassium citrate (UROCIT-K) 10  MEQ (1080 MG) SR tablet Take 1 tablet by mouth daily. 10/11/19   [provider]  promethazine-dextromethorphan (PROMETHAZINE-DM) 6.25-15 MG/5ML syrup Take 5 mLs by mouth 4 (four) times daily as needed for cough. Patient not taking: Reported on 12/31/2020 04/26/20   Faustino Congress, NP  sodium bicarbonate 650 MG tablet Take 650 mg by mouth 3 (three) times daily. Patient not taking: Reported on 12/31/2020    [provider]  TRUEplus Lancets 30G MISC Use as directed to check blood sugar daily. 03/04/20   Fayrene Helper, MD    Physical Exam: BP (!) 126/92   Pulse (!) 132   Temp 97.9 F (36.6 C) (Oral)   Resp 19   Ht 6' (1.829 m)   Wt 89.8 kg   SpO2 98%   BMI 26.85 kg/m   General: Elderly male. Awake and alert and oriented x3. No acute cardiopulmonary distress.  HEENT: Normocephalic atraumatic.  Right and left ears normal in appearance.  Pupils equal, round, reactive to light. Extraocular muscles are intact. Sclerae anicteric and noninjected.  Moist mucosal membranes. No mucosal lesions.  Neck: Neck supple without lymphadenopathy. No carotid bruits. No masses palpated.  Cardiovascular: Regular rate with normal S1-S2 sounds. No murmurs, rubs, gallops auscultated. No JVD.  Respiratory: Good respiratory effort with no wheezes, rales, rhonchi. Lungs clear to auscultation bilaterally.  No accessory muscle use. Abdomen: Soft, tender to palpation. No rebound or guarding. Nondistended. Active bowel sounds. No masses or hepatosplenomegaly  Skin: No rashes, lesions, or ulcerations.  Dry, warm to touch. 2+ dorsalis pedis and radial pulses. Musculoskeletal: No calf or leg pain. All major joints not erythematous nontender.  No upper or lower joint deformation.  Good ROM.  No contractures  Psychiatric: Intact judgment and insight. Pleasant and cooperative. Neurologic: No focal neurological deficits. Strength is 5/5 and symmetric in upper and lower extremities.  Cranial nerves II  through XII are grossly intact.           Labs on Admission: I have personally reviewed following labs and imaging studies  CBC: Recent Labs  Lab 02/22/21 1100  WBC 7.2  NEUTROABS 5.7  HGB 4.6*  HCT 15.7*  MCV 92.9  PLT 166   Basic Metabolic Panel: Recent Labs  Lab 02/22/21 1100  NA 138  K 4.1  CL 111  CO2 20*  GLUCOSE 142*  BUN 37*  CREATININE 4.18*  CALCIUM 9.0   GFR: Estimated Creatinine Clearance: 19.9 mL/min (A) (by C-G formula based on SCr of 4.18 mg/dL (H)). Liver Function Tests: Recent Labs  Lab 02/22/21 1100  AST 23  ALT 11  ALKPHOS 106  BILITOT 0.9  PROT 7.1  ALBUMIN 3.6   Recent Labs  Lab 02/22/21 1100  LIPASE 45   No results for input(s): AMMONIA in the last 168 hours. Coagulation Profile: No results for input(s): INR, PROTIME in the last 168 hours. Cardiac Enzymes: No results for input(s): CKTOTAL, CKMB, CKMBINDEX, TROPONINI in the last 168 hours. BNP (last 3 results) No results for input(s): PROBNP in the last 8760 hours. HbA1C: No results for input(s): HGBA1C in the last 72 hours. CBG: No results for input(s): GLUCAP in the last 168 hours. Lipid Profile: No results for input(s): CHOL, HDL, LDLCALC, TRIG, CHOLHDL, LDLDIRECT in the last 72 hours. Thyroid Function Tests: No results for input(s): TSH, T4TOTAL, FREET4, T3FREE, THYROIDAB in the last 72 hours. Anemia Panel: Recent Labs    02/22/21 1100 02/22/21 1201  VITAMINB12  --  283  FOLATE  --  8.6  FERRITIN  --  5*  TIBC  --  345  IRON  --  8*  RETICCTPCT 3.1  --    Urine analysis:    Component Value Date/Time   COLORURINE YELLOW 02/22/2021 1225   APPEARANCEUR CLEAR 02/22/2021 1225   APPEARANCEUR Clear 06/20/2020 1415   LABSPEC 1.012 02/22/2021 1225   PHURINE 5.0 02/22/2021 1225   GLUCOSEU NEGATIVE 02/22/2021 [place] 02/22/2021 [place] 02/22/2021 1225   BILIRUBINUR Negative 06/20/2020 [place] 02/22/2021 1225    PROTEINUR NEGATIVE 02/22/2021 1225   UROBILINOGEN 0.2 12/27/2019 0937   UROBILINOGEN 0.2 04/08/2011 1232   NITRITE NEGATIVE 02/22/2021 1225   LEUKOCYTESUR NEGATIVE 02/22/2021 1225   Sepsis Labs: '@LABRCNTIP' (procalcitonin:4,lacticidven:4) ) Recent Results (from the past 240 hour(s))  Resp Panel by RT-PCR (Flu A&B, Covid) Nasopharyngeal Swab     Status:  None   Collection Time: 02/22/21  1:10 PM   Specimen: Nasopharyngeal Swab; Nasopharyngeal(NP) swabs in vial transport medium  Result Value Ref Range Status   SARS Coronavirus 2 by RT PCR NEGATIVE NEGATIVE Final    Comment: (NOTE) SARS-CoV-2 target nucleic acids are NOT DETECTED.  The SARS-CoV-2 RNA is generally detectable in upper respiratory specimens during the acute phase of infection. The lowest concentration of SARS-CoV-2 viral copies this assay can detect is 138 copies/mL. A negative result does not preclude SARS-Cov-2 infection and should not be used as the sole basis for treatment or other patient management decisions. A negative result may occur with  improper specimen collection/handling, submission of specimen other than nasopharyngeal swab, presence of viral mutation(s) within the areas targeted by this assay, and inadequate number of viral copies(<138 copies/mL). A negative result must be combined with clinical observations, patient history, and epidemiological information. The expected result is Negative.  Fact Sheet for Patients:  EntrepreneurPulse.com.au  Fact Sheet for Healthcare Providers:  IncredibleEmployment.be  This test is no t yet approved or cleared by the [place] FDA and  has been authorized for detection and/or diagnosis of SARS-CoV-2 by FDA under an Emergency Use Authorization (EUA). This EUA will remain  in effect (meaning this test can be used) for the duration of the COVID-19 declaration under Section 564(b)(1) of the Act, 21 U.S.C.section 360bbb-3(b)(1),  unless the authorization is terminated  or revoked sooner.       Influenza A by PCR NEGATIVE NEGATIVE Final   Influenza B by PCR NEGATIVE NEGATIVE Final    Comment: (NOTE) The Xpert Xpress SARS-CoV-2/FLU/RSV plus assay is intended as an aid in the diagnosis of influenza from Nasopharyngeal swab specimens and should not be used as a sole basis for treatment. Nasal washings and aspirates are unacceptable for Xpert Xpress SARS-CoV-2/FLU/RSV testing.  Fact Sheet for Patients: EntrepreneurPulse.com.au  Fact Sheet for Healthcare Providers: IncredibleEmployment.be  This test is not yet approved or cleared by the [place] FDA and has been authorized for detection and/or diagnosis of SARS-CoV-2 by FDA under an Emergency Use Authorization (EUA). This EUA will remain in effect (meaning this test can be used) for the duration of the COVID-19 declaration under Section 564(b)(1) of the Act, 21 U.S.C. section 360bbb-3(b)(1), unless the authorization is terminated or revoked.  Performed at [facility], [address], [place], [place] [address]      Radiological Exams on Admission: CT ABDOMEN PELVIS WO CONTRAST  Result Date: 02/22/2021 CLINICAL DATA:  Abdominal pain EXAM: CT ABDOMEN AND PELVIS WITHOUT CONTRAST TECHNIQUE: Multidetector CT imaging of the abdomen and pelvis was performed following the standard protocol without IV contrast. COMPARISON:  07/11/2019 FINDINGS: Lower chest: Coronary stent. Transvenous lead extends towards the RV apex. No pleural or pericardial effusion. Visualized lung bases clear. Hepatobiliary: No focal liver abnormality is seen. Status post cholecystectomy. No biliary dilatation. Pancreas: Unremarkable. No pancreatic ductal dilatation or surrounding inflammatory changes. Spleen: Normal in size without focal abnormality. Adrenals/Urinary Tract: Adrenal glands unremarkable. Bilateral urolithiasis, largest stone 1.7 cm right lower  pole collecting system. There is a 1 cm mid left calculus. No hydronephrosis. Stable 5.1 cm left lower pole probable cyst. Urinary bladder incompletely distended. Stomach/Bowel: Stomach is partially distended, unremarkable. The small bowel is nondilated. Normal appendix. The colon is nondilated, unremarkable. Vascular/Lymphatic: Moderate calcified aortoiliac plaque without aneurysm. No abdominal or pelvic adenopathy. Reproductive: Prostate enlargement with central calcifications. Other: No ascites.  Right pelvic phleboliths.  No free air. Musculoskeletal: Exuberant anterior endplate  spurring in the thoracolumbar spine. No fracture or worrisome bone lesion. IMPRESSION: 1. No acute findings. 2. Nonobstructive bilateral urolithiasis. 3.  Aortic Atherosclerosis (ICD10-170.0). Electronically Signed   By: Lucrezia Europe M.D.   On: 02/22/2021 14:41   DG Chest Portable 1 View  Result Date: 02/22/2021 CLINICAL DATA:  Shortness of breath.  Lethargy and abdominal pain. EXAM: PORTABLE CHEST 1 VIEW COMPARISON:  09/27/2019 FINDINGS: Patient has LEFT-sided AICD with leads to the RIGHT ventricle. The heart is mildly enlarged and stable in configuration. There are no focal consolidations or pleural effusions. No edema. IMPRESSION: Stable cardiomegaly. Electronically Signed   By: Nolon Nations M.D.   On: 02/22/2021 11:37    EKG: Independently reviewed. Sinus rhythm.  Nonspecific T wave abnormality.  No change from prior.  Assessment/Plan: Principal Problem:   Acute anemia Active Problems:   Essential hypertension   Chronic kidney disease, stage IV (severe) (HCC)   Congestive heart failure (HCC)   Ischemic cardiomyopathy   CAD in native artery   Type 2 diabetes mellitus without complication [facility])    This patient was discussed with the ED physician, including pertinent vitals, physical exam findings, labs, and imaging.  We also discussed care given by the ED provider.  Acute anemia Observation Clear liquid  diet Hemoccult negative GI consulted Transfuse 3 units Nonpharmacologic DVT prophylaxis H&H in the morning Stage IV chronic kidney disease Creatinine appears a little above baseline Recheck creatinine in the morning CAD with history of ischemic cardiomyopathy and HFrEF at 20 to 25% and hypertension Continue with diuretics especially after large volume with blood transfusion Will monitor fluid status closely Prediabetes   DVT prophylaxis: SCDs Consultants: GI Code Status: Full code Family Communication: None Disposition Plan:    Truett Mainland, DO

## 2021-02-22 NOTE — Progress Notes (Signed)
Second unit of prbc infusing with no adverse reaction.  Contacted pharmacy to get home meds started.

## 2021-02-22 NOTE — ED Provider Notes (Signed)
[facility] EMERGENCY DEPARTMENT Provider Note   CSN: [id number] Arrival date & time: 02/22/21  5284     History Chief Complaint  Patient presents with   Shortness of Breath    [name] is a 65 y.o. male.   Shortness of Breath Associated symptoms: abdominal pain   Associated symptoms: no chest pain, no cough, no fever, no headaches, no neck pain, no rash, no vomiting and no wheezing       [name] is a 65 y.o. male with past medical history of CHF, stage IV CKD, hypertension, ischemic cardiomyopathy, and prior MI in 2020 who presents to the Emergency Department complaining of generalized weakness, dyspnea on exertion, and diffuse abdominal pain.  His symptoms have been present for several weeks, gradually worsening.  He states that he fatigues easily with the slightest exertion.  Has diffuse abdominal pain not associated with food intake.  This morning, he describes a near syncopal episode upon standing and this prompted his ER visit today.  He has an appointment with gastroenterology on November 2.  He also endorses some black stools at times.  No diarrhea, fever, chills, or vomiting.  Takes aspirin daily but denies any other NSAID use.  No history of alcohol use or hx of GI bleed.  He denies any chest pain, cough, or headaches.   Past Medical History:  Diagnosis Date   Acute ST elevation myocardial infarction (STEMI) due to occlusion of mid portion of left anterior descending (LAD) coronary artery ([place]) 13/06/4399   Acute systolic heart failure (HCC)    Arthritis    CHF (congestive heart failure) (HCC)    CKD (chronic kidney disease) stage 4, GFR 15-29 ml/min (HCC)    Dyspnea    with exertionm periodically   Heart attack ([place]) 03/15/2019   anterior MI    History of kidney stones    Hyperparathyroidism ([place])    Hypertension 2002   Ischemic cardiomyopathy    Left knee pain    Syncope 04/04/2019   after blood draw   Vitamin D deficiency     Patient Active  Problem List   Diagnosis Date Noted   SOB (shortness of breath)    Left renal stone 09/26/2019   CAD in native artery    Ischemic cardiomyopathy 02/72/5366   Acute systolic heart failure ([place]) 03/22/2019   Congestive heart failure ([place]) 03/21/2019   ST elevation myocardial infarction involving left anterior descending (LAD) coronary artery (HCC)    Chronic kidney disease, stage IV (severe) ([place]) 06/11/2016   Elevated PSA 06/11/2016   Hyperlipidemia 06/11/2016   Essential hypertension 05/28/2016    Past Surgical History:  Procedure Laterality Date   AMPUTATION TOE Left 1991   2 digit of left foot   CHOLECYSTECTOMY     COLONOSCOPY     CORONARY/GRAFT ACUTE MI REVASCULARIZATION N/A 03/12/2019   Procedure: Coronary/Graft Acute MI Revascularization;  Surgeon: Belva Crome, MD;  Location: [facility];  Service: Cardiovascular;  Laterality: N/A;   ICD IMPLANT N/A 08/24/2019   Procedure: ICD IMPLANT;  Surgeon: Evans Lance, MD;  Location: [place] [facility];  Service: Cardiovascular;  Laterality: N/A;   IR URETERAL STENT LEFT NEW ACCESS W/O SEP NEPHROSTOMY CATH  09/26/2019   LEFT HEART CATH AND CORONARY ANGIOGRAPHY N/A 03/12/2019   Procedure: LEFT HEART CATH AND CORONARY ANGIOGRAPHY;  Surgeon: Belva Crome, MD;  Location: [facility];  Service: Cardiovascular;  Laterality: N/A;   LITHOTRIPSY Right    NEPHROLITHOTOMY  Left 09/26/2019   Procedure: LEFT NEPHROLITHOTOMY PERCUTANEOUS;  Surgeon: Irine Seal, MD;  Location: [facility];  Service: Urology;  Laterality: Left;       Family History  Problem Relation Age of Onset   Heart failure Mother    Heart disease Mother    Cancer Mother        breast   Heart attack Father    Hypertension Father    Heart disease Father    Diabetes Brother     Social History   Tobacco Use   Smoking status: Former    Packs/day: 0.50    Years: 40.00    Pack years: 20.00    Types: Cigarettes    Quit date: 03/12/2019    Years since  quitting: 1.9   Smokeless tobacco: Never  Vaping Use   Vaping Use: Never used  Substance Use Topics   Alcohol use: No   Drug use: No    Home Medications Prior to Admission medications   Medication Sig Start Date End Date Taking? Authorizing Provider  Alcohol Swabs (B-D SINGLE USE SWABS REGULAR) PADS USE AS DIRECTED Patient not taking: Reported on 12/31/2020 05/02/20   Fayrene Helper, MD  allopurinol (ZYLOPRIM) 100 MG tablet Take 100 mg by mouth daily.  04/25/19 06/27/20  [provider]  aspirin 81 MG chewable tablet Chew 1 tablet (81 mg total) by mouth daily. 03/16/19   Cheryln Manly, NP  atorvastatin (LIPITOR) 80 MG tablet TAKE 1 TABLET BY MOUTH DAILY AT 6PM 12/17/20   Fayrene Helper, MD  blood glucose meter kit and supplies Dispense based on patient and insurance preference. Use up to four times daily as directed. (FOR ICD-10 E10.9, E11.9). 03/04/20   Perlie Mayo, NP  BRILINTA 60 MG TABS tablet Take 1 tablet by mouth twice daily 10/02/20   Bensimhon, Shaune Pascal, MD  carvedilol (COREG) 12.5 MG tablet TAKE 1 TABLET BY MOUTH TWICE DAILY WITH MEALS 11/26/20   Bensimhon, Shaune Pascal, MD  cinacalcet (SENSIPAR) 30 MG tablet Take 30 mg by mouth daily. 12/28/20   [provider]  colchicine 0.6 MG tablet Take 0.6 mg by mouth daily. 05/15/20   [provider]  fluticasone (FLONASE) 50 MCG/ACT nasal spray Place 2 sprays into both nostrils daily. 08/28/20   Lindell Spar, MD  furosemide (LASIX) 40 MG tablet Take 1 tablet (40 mg total) by mouth daily. 10/10/20   Bensimhon, Shaune Pascal, MD  gabapentin (NEURONTIN) 600 MG tablet Take 1 tablet (600 mg total) by mouth 3 (three) times daily. 08/22/20 08/22/21  Raulkar, Clide Deutscher, MD  HYDROcodone-acetaminophen (NORCO) 10-325 MG tablet Take 1 tablet by mouth 2 (two) times daily as needed. 03/12/20   Mcarthur Rossetti, MD  ivabradine (CORLANOR) 5 MG TABS tablet Take 1 tablet (5 mg total) by mouth 2 (two) times daily with a meal.  06/03/20   Rosita Fire, Brittainy M, PA-C  meloxicam (MOBIC) 15 MG tablet TAKE 1 TABLET BY MOUTH ONCE A DAY. 01/21/21   Raulkar, Clide Deutscher, MD  nitroGLYCERIN (NITROSTAT) 0.4 MG SL tablet Place 1 tablet (0.4 mg total) under the tongue every 5 (five) minutes as needed. 04/11/19   Corum, Rex Kras, MD  potassium citrate (UROCIT-K) 10 MEQ (1080 MG) SR tablet Take 1 tablet by mouth daily. 10/11/19   [provider]  promethazine-dextromethorphan (PROMETHAZINE-DM) 6.25-15 MG/5ML syrup Take 5 mLs by mouth 4 (four) times daily as needed for cough. Patient not taking: Reported on 12/31/2020 04/26/20   Zigmund Daniel,  Colletta Maryland, NP  sodium bicarbonate 650 MG tablet Take 650 mg by mouth 3 (three) times daily. Patient not taking: Reported on 12/31/2020    [provider]  TRUEplus Lancets 30G MISC Use as directed to check blood sugar daily. 03/04/20   Fayrene Helper, MD    Allergies    Patient has no known allergies.  Review of Systems   Review of Systems  Constitutional:  Positive for fatigue. Negative for chills and fever.  HENT:  Negative for trouble swallowing.   Eyes:  Negative for visual disturbance.  Respiratory:  Positive for shortness of breath. Negative for cough and wheezing.   Cardiovascular:  Negative for chest pain and palpitations.  Gastrointestinal:  Positive for abdominal pain. Negative for blood in stool, diarrhea, nausea and vomiting.  Genitourinary:  Negative for dysuria and flank pain.  Musculoskeletal:  Negative for back pain, myalgias, neck pain and neck stiffness.  Skin:  Negative for rash.  Neurological:  Positive for weakness (generalized weakness) and light-headedness. Negative for dizziness, syncope, facial asymmetry, numbness and headaches.  Hematological:  Does not bruise/bleed easily.   Physical Exam Updated Vital Signs BP 101/65   Pulse 75   Temp 97.8 F (36.6 C) (Oral)   Resp (!) 21   Ht 6' (1.829 m)   Wt 89.8 kg   SpO2 100%   BMI 26.85 kg/m   Physical  Exam Vitals and nursing note reviewed.  Constitutional:      General: He is not in acute distress.    Appearance: Normal appearance. He is well-developed. He is not ill-appearing or toxic-appearing.  HENT:     Mouth/Throat:     Mouth: Mucous membranes are moist.  Eyes:     Conjunctiva/sclera: Conjunctivae normal.  Cardiovascular:     Rate and Rhythm: Normal rate and regular rhythm.     Pulses: Normal pulses.  Pulmonary:     Effort: Pulmonary effort is normal.     Breath sounds: Normal breath sounds.  Abdominal:     General: There is no distension.     Palpations: Abdomen is soft.     Tenderness: There is abdominal tenderness. There is no guarding or rebound.  Genitourinary:    Rectum: Guaiac result negative. No mass. Normal anal tone.     Comments: DRE performed by me.  Dark brown heme-negative stool present.  No palpable rectal masses or abnormal rectal tone. Musculoskeletal:        General: Normal range of motion.     Right lower leg: No edema.     Left lower leg: No edema.  Skin:    General: Skin is warm.     Capillary Refill: Capillary refill takes less than 2 seconds.  Neurological:     General: No focal deficit present.     Mental Status: He is alert.     Sensory: Sensation is intact. No sensory deficit.     Motor: Motor function is intact. No weakness.     Comments: Cranial nerves II through XII grossly intact.  Speech clear.  No facial droop or pronator drift.  Mentating well.    ED Results / Procedures / Treatments   Labs (all labs ordered are listed, but only abnormal results are displayed) Labs Reviewed  COMPREHENSIVE METABOLIC PANEL - Abnormal; Notable for the following components:      Result Value   CO2 20 (*)    Glucose, Bld 142 (*)    BUN 37 (*)    Creatinine, Ser 4.18 (*)  GFR, Estimated 15 (*)    All other components within normal limits  CBC WITH DIFFERENTIAL/PLATELET - Abnormal; Notable for the following components:   RBC 1.69 (*)     Hemoglobin 4.6 (*)    HCT 15.7 (*)    MCHC 29.3 (*)    RDW 16.8 (*)    All other components within normal limits  BRAIN NATRIURETIC PEPTIDE - Abnormal; Notable for the following components:   B Natriuretic Peptide 157.0 (*)    All other components within normal limits  D-DIMER, QUANTITATIVE - Abnormal; Notable for the following components:   D-Dimer, Quant 2.83 (*)    All other components within normal limits  IRON AND TIBC - Abnormal; Notable for the following components:   Iron 8 (*)    Saturation Ratios 2 (*)    All other components within normal limits  FERRITIN - Abnormal; Notable for the following components:   Ferritin 5 (*)    All other components within normal limits  RETICULOCYTES - Abnormal; Notable for the following components:   RBC. 1.67 (*)    Immature Retic Fract 31.2 (*)    All other components within normal limits  RESP PANEL BY RT-PCR (FLU A&B, COVID) ARPGX2  LIPASE, BLOOD  URINALYSIS, ROUTINE W REFLEX MICROSCOPIC  VITAMIN B12  FOLATE  POC OCCULT BLOOD, ED  TYPE AND SCREEN  PREPARE RBC (CROSSMATCH)  ABO/RH  TROPONIN I (HIGH SENSITIVITY)  TROPONIN I (HIGH SENSITIVITY)    EKG EKG Interpretation  Date/Time:  Saturday February 22 2021 10:08:21 EDT Ventricular Rate:  66 PR Interval:  163 QRS Duration: 103 QT Interval:  470 QTC Calculation: 493 R Axis:   -9 Text Interpretation: Sinus rhythm Nonspecific T wave abnormality No significant change since last tracing Confirmed by Lajean Saver [phone]) on 02/22/2021 10:15:55 AM  Radiology CT ABDOMEN PELVIS WO CONTRAST  Result Date: 02/22/2021 CLINICAL DATA:  Abdominal pain EXAM: CT ABDOMEN AND PELVIS WITHOUT CONTRAST TECHNIQUE: Multidetector CT imaging of the abdomen and pelvis was performed following the standard protocol without IV contrast. COMPARISON:  07/11/2019 FINDINGS: Lower chest: Coronary stent. Transvenous lead extends towards the RV apex. No pleural or pericardial effusion. Visualized lung bases  clear. Hepatobiliary: No focal liver abnormality is seen. Status post cholecystectomy. No biliary dilatation. Pancreas: Unremarkable. No pancreatic ductal dilatation or surrounding inflammatory changes. Spleen: Normal in size without focal abnormality. Adrenals/Urinary Tract: Adrenal glands unremarkable. Bilateral urolithiasis, largest stone 1.7 cm right lower pole collecting system. There is a 1 cm mid left calculus. No hydronephrosis. Stable 5.1 cm left lower pole probable cyst. Urinary bladder incompletely distended. Stomach/Bowel: Stomach is partially distended, unremarkable. The small bowel is nondilated. Normal appendix. The colon is nondilated, unremarkable. Vascular/Lymphatic: Moderate calcified aortoiliac plaque without aneurysm. No abdominal or pelvic adenopathy. Reproductive: Prostate enlargement with central calcifications. Other: No ascites.  Right pelvic phleboliths.  No free air. Musculoskeletal: Exuberant anterior endplate spurring in the thoracolumbar spine. No fracture or worrisome bone lesion. IMPRESSION: 1. No acute findings. 2. Nonobstructive bilateral urolithiasis. 3.  Aortic Atherosclerosis (ICD10-170.0). Electronically Signed   By: Lucrezia Europe M.D.   On: 02/22/2021 14:41   DG Chest Portable 1 View  Result Date: 02/22/2021 CLINICAL DATA:  Shortness of breath.  Lethargy and abdominal pain. EXAM: PORTABLE CHEST 1 VIEW COMPARISON:  09/27/2019 FINDINGS: Patient has LEFT-sided AICD with leads to the RIGHT ventricle. The heart is mildly enlarged and stable in configuration. There are no focal consolidations or pleural effusions. No edema. IMPRESSION: Stable cardiomegaly. Electronically Signed  By: Nolon Nations M.D.   On: 02/22/2021 11:37    Procedures Procedures   Medications Ordered in ED Medications  0.9 %  sodium chloride infusion (10 mL/hr Intravenous New Bag/Given 02/22/21 1214)  pantoprazole (PROTONIX) injection 40 mg (40 mg Intravenous Given 02/22/21 1304)  acetaminophen  (TYLENOL) tablet 650 mg (650 mg Oral Given 02/22/21 1304)  oxyCODONE (Oxy IR/ROXICODONE) immediate release tablet 5 mg (5 mg Oral Given 02/22/21 1502)    ED Course  I have reviewed the triage vital signs and the nursing notes.  Pertinent labs & imaging results that were available during my care of the patient were reviewed by me and considered in my medical decision making (see chart for details).   CRITICAL CARE Performed by: Daleisa Halperin Total critical care time:  40 minutes Critical care time was exclusive of separately billable procedures and treating other patients. Critical care was necessary to treat or prevent imminent or life-threatening deterioration. Critical care was time spent personally by me on the following activities: development of treatment plan with patient and/or surrogate as well as nursing, discussions with consultants, evaluation of patient's response to treatment, examination of patient, obtaining history from patient or surrogate, ordering and performing treatments and interventions, ordering and review of laboratory studies, ordering and review of radiographic studies, pulse oximetry and re-evaluation of patient's condition.    MDM Rules/Calculators/A&P                           Patient here for evaluation of diffuse abdominal pain, generalized weakness and dyspnea upon exertion.  Patient does have cardiac history, but denies any chest pain recently.  He does have internal defibrillator.  Defibrillator was interrogated yesterday per pt  On exam, patient well-appearing nontoxic.  Vital signs reassuring.  Blood pressure is soft.  Diffuse abdominal tenderness without guarding or rebound.  We will check labs, chest x-ray, EKG  Labs interpreted by me, hemoglobin of 4.6.  His hemoglobin was 12 in April of this year.  BNP mildly elevated at 157.  D-dimer 2.83, felt to be elevated due to Hgb level.  No chest pain, tachycardia or tachypnea.  No increased work of  breathing.  Serum creatinine of 4.18 and BUN elevated form baseline.  This is increased from his baseline of 2-3 from 5 months ago.  Lipase and delta troponin unremarkable.  Iron level is 8 today.    CT abdomen and pelvis without acute findings.  Additional history, patient has not had prior colonoscopy or EGD.  1500 Discussed findings with GI, Dr. Laural Golden.  Requests hospitalist admit and patient remain on clear liquids and he will see in consultation in the morning and arrange likely colonoscopy and EGD.  Discussed findings with [facility] hospitalist, Dr. Nehemiah Settle who is agreeable to admit.  Final Clinical Impression(s) / ED Diagnoses Final diagnoses:  Symptomatic anemia    Rx / DC Orders ED Discharge Orders     None        Kem Parkinson, PA-C 02/22/21 1543    Lajean Saver, MD 02/23/21 [phone]

## 2021-02-22 NOTE — ED Notes (Signed)
Tammy Triplett, PA says pt can have clear liquids until tomorrow per GI.

## 2021-02-22 NOTE — ED Notes (Signed)
Pt just returned form CT

## 2021-02-22 NOTE — ED Triage Notes (Signed)
Pt c/o SOB, lethargic and abdominal pain. Pt has appt GI Dr on Nov. 2nd.

## 2021-02-23 DIAGNOSIS — I5022 Chronic systolic (congestive) heart failure: Secondary | ICD-10-CM

## 2021-02-23 DIAGNOSIS — D649 Anemia, unspecified: Secondary | ICD-10-CM | POA: Diagnosis not present

## 2021-02-23 DIAGNOSIS — N184 Chronic kidney disease, stage 4 (severe): Secondary | ICD-10-CM | POA: Diagnosis not present

## 2021-02-23 DIAGNOSIS — I10 Essential (primary) hypertension: Secondary | ICD-10-CM

## 2021-02-23 DIAGNOSIS — I2510 Atherosclerotic heart disease of native coronary artery without angina pectoris: Secondary | ICD-10-CM | POA: Diagnosis not present

## 2021-02-23 DIAGNOSIS — D509 Iron deficiency anemia, unspecified: Secondary | ICD-10-CM | POA: Diagnosis not present

## 2021-02-23 LAB — HEMOGLOBIN AND HEMATOCRIT, BLOOD
HCT: 20.9 % — ABNORMAL LOW (ref 39.0–52.0)
HCT: 20.7 % — ABNORMAL LOW (ref 39.0–52.0)
HCT: 23.3 % — ABNORMAL LOW (ref 39.0–52.0)
Hemoglobin: 6.6 g/dL — CL (ref 13.0–17.0)
Hemoglobin: 6.5 g/dL — CL (ref 13.0–17.0)
Hemoglobin: 7.3 g/dL — ABNORMAL LOW (ref 13.0–17.0)

## 2021-02-23 LAB — PREPARE RBC (CROSSMATCH)

## 2021-02-23 MED ORDER — SODIUM CHLORIDE 0.9 % IV SOLN
200.0000 mg | Freq: Once | INTRAVENOUS | Status: AC
  Administered 2021-02-23: 200 mg via INTRAVENOUS
  Filled 2021-02-23: qty 10

## 2021-02-23 MED ORDER — SODIUM CHLORIDE 0.9% IV SOLUTION: Freq: Once | INTRAVENOUS | Status: DC

## 2021-02-23 NOTE — Progress Notes (Signed)
PROGRESS NOTE    [name]  WCH:[id number] DOB: Aug 13, 1955 DOA: 02/22/2021 PCP: Lindell Spar, MD    Chief Complaint  Patient presents with   Shortness of Breath    Brief Narrative:  As per H&P written by Dr. Nehemiah Settle on 02/22/2021 [name] is a 65 y.o. male with a history of hypertension, coronary artery disease, diabetes, chronic kidney disease, HFrEF with history of ischemic cardiomyopathy and EF of 20 to 25% on echocardiogram 07/2019.  Patient seen for worsening shortness of breath, weakness, fatigue over the past couple of weeks.  Worse with moving around and improved with rest.  He does report darker stools, but no frank melena or blood in stools.  Has not been taking large amounts of NSAIDs and does not drink alcohol.  Diet and appetite have been normal.  No other palliating or provoking factors.  He does complain of abdominal pain that is in his left upper abdomen abdomen, which started earlier today.  Denies diarrhea, constipation.   Emergency Department Course: Hemoglobin 6.  Creatinine 4.18. CT abdomen shows nephrolithiasis without obstruction. Troponins negative. BNP 157  Assessment & Plan: 1-acute on chronic anemia -Patient with chronic anemia in the setting of chronic renal failure (stage IV at baseline) -Presenting with symptomatic anemia and a hemoglobin level of 4.6 -Status post 4 units of PRBCs -Current hemoglobin level 7.3 -Reporting significant improvement in his symptoms -No overt bleeding. -Positive fecal occult blood test appreciated at time of admission -Case has been discussed with gastroenterology service who after seeing patient and offering work-up has decided that if the patient remains stable can have an outpatient follow-up and work-up. -Will advance diet -Reassess hemoglobin level in a.m. and further decides intervention. -IV iron will be provided. -Avoid the use of NSAIDs  2-Essential hypertension -Stable overall -Advised to follow  low-sodium diet -Continue current antihypertensive agents.  3-chronic systolic heart failure -Last ejection fraction 20 to 25% -Continue to follow daily weights -Maintain adequate hydration and follow low-sodium diet -Continue outpatient follow-up with cardiology service -Condition is compensated currently.  4-Chronic kidney disease, stage IV (severe) ([place]) -Continue outpatient follow-up with nephrology service -Renal function appears to be at his baseline.  5-CAD in native artery -Patient denies chest pain -Given prior history of coronary disease transfusion threshold is for hemoglobin less than 7.5. -Continue the use of Brilinta.  DVT prophylaxis: SCDs Code Status: Full code Family Communication: No family at bedside; patient expressed that he will update his family on his own. Disposition:   Status is: Observation   Consultants:  Gastroenterology service  Procedures:  None  Antimicrobials: None   Subjective: No fever, no chest pain, no nausea, no vomiting.  Reports improvement in his breathing and overall feeling better.  Patient reports significant hunger.  No operative bleeding reported overnight.  Objective: Vitals:   02/23/21 0642 02/23/21 0831 02/23/21 1228 02/23/21 1617  BP: 119/82 112/71 113/89 122/81  Pulse: (!) 54 (!) 54 (!) 50 (!) 53  Resp: 17  17   Temp: 98 F (36.7 C)  98 F (36.7 C)   TempSrc: Oral  Oral   SpO2: 100%  100%   Weight:      Height:        Intake/Output Summary (Last 24 hours) at 02/23/2021 1743 Last data filed at 02/23/2021 0920 Gross per 24 hour  Intake 2177 ml  Output --  Net 2177 ml   Filed Weights   02/22/21 1009  Weight: 89.8 kg    Examination:  General  exam: Appears calm and comfortable; denies chest pain, no nausea, no vomiting.  Patient expressed to be super hungry and reported no overt bleeding. Respiratory system: Clear to auscultation. Respiratory effort normal.  Good saturation on room air. Cardiovascular  system: S1 and S2 appreciated; no rubs, no gallops, no JVD. Gastrointestinal system: Abdomen is nondistended, soft and nontender. No organomegaly or masses felt. Normal bowel sounds heard. Central nervous system: Alert and oriented. No focal neurological deficits. Extremities: No cyanosis or clubbing Skin: No no petechiae Psychiatry: Judgement and insight appear normal. Mood & affect appropriate.     Data Reviewed: I have personally reviewed following labs and imaging studies  CBC: Recent Labs  Lab 02/22/21 1100 02/23/21 0213 02/23/21 0411 02/23/21 1026  WBC 7.2  --   --   --   NEUTROABS 5.7  --   --   --   HGB 4.6* 6.6* 6.5* 7.3*  HCT 15.7* 20.9* 20.7* 23.3*  MCV 92.9  --   --   --   PLT 335  --   --   --     Basic Metabolic Panel: Recent Labs  Lab 02/22/21 1100  NA 138  K 4.1  CL 111  CO2 20*  GLUCOSE 142*  BUN 37*  CREATININE 4.18*  CALCIUM 9.0    GFR: Estimated Creatinine Clearance: 19.9 mL/min (A) (by C-G formula based on SCr of 4.18 mg/dL (H)).  Liver Function Tests: Recent Labs  Lab 02/22/21 1100  AST 23  ALT 11  ALKPHOS 106  BILITOT 0.9  PROT 7.1  ALBUMIN 3.6    CBG: No results for input(s): GLUCAP in the last 168 hours.  Recent Results (from the past 240 hour(s))  Resp Panel by RT-PCR (Flu A&B, Covid) Nasopharyngeal Swab     Status: None   Collection Time: 02/22/21  1:10 PM   Specimen: Nasopharyngeal Swab; Nasopharyngeal(NP) swabs in vial transport medium  Result Value Ref Range Status   SARS Coronavirus 2 by RT PCR NEGATIVE NEGATIVE Final    Comment: (NOTE) SARS-CoV-2 target nucleic acids are NOT DETECTED.  The SARS-CoV-2 RNA is generally detectable in upper respiratory specimens during the acute phase of infection. The lowest concentration of SARS-CoV-2 viral copies this assay can detect is 138 copies/mL. A negative result does not preclude SARS-Cov-2 infection and should not be used as the sole basis for treatment or other patient  management decisions. A negative result may occur with  improper specimen collection/handling, submission of specimen other than nasopharyngeal swab, presence of viral mutation(s) within the areas targeted by this assay, and inadequate number of viral copies(<138 copies/mL). A negative result must be combined with clinical observations, patient history, and epidemiological information. The expected result is Negative.  Fact Sheet for Patients:  EntrepreneurPulse.com.au  Fact Sheet for Healthcare Providers:  IncredibleEmployment.be  This test is no t yet approved or cleared by the [place] FDA and  has been authorized for detection and/or diagnosis of SARS-CoV-2 by FDA under an Emergency Use Authorization (EUA). This EUA will remain  in effect (meaning this test can be used) for the duration of the COVID-19 declaration under Section 564(b)(1) of the Act, 21 U.S.C.section 360bbb-3(b)(1), unless the authorization is terminated  or revoked sooner.       Influenza A by PCR NEGATIVE NEGATIVE Final   Influenza B by PCR NEGATIVE NEGATIVE Final    Comment: (NOTE) The Xpert Xpress SARS-CoV-2/FLU/RSV plus assay is intended as an aid in the diagnosis of influenza from Nasopharyngeal swab specimens  and should not be used as a sole basis for treatment. Nasal washings and aspirates are unacceptable for Xpert Xpress SARS-CoV-2/FLU/RSV testing.  Fact Sheet for Patients: EntrepreneurPulse.com.au  Fact Sheet for Healthcare Providers: IncredibleEmployment.be  This test is not yet approved or cleared by the [place] FDA and has been authorized for detection and/or diagnosis of SARS-CoV-2 by FDA under an Emergency Use Authorization (EUA). This EUA will remain in effect (meaning this test can be used) for the duration of the COVID-19 declaration under Section 564(b)(1) of the Act, 21 U.S.C. section 360bbb-3(b)(1),  unless the authorization is terminated or revoked.  Performed at [facility], [address], [place], [place] [address]      Radiology Studies: CT ABDOMEN PELVIS WO CONTRAST  Result Date: 02/22/2021 CLINICAL DATA:  Abdominal pain EXAM: CT ABDOMEN AND PELVIS WITHOUT CONTRAST TECHNIQUE: Multidetector CT imaging of the abdomen and pelvis was performed following the standard protocol without IV contrast. COMPARISON:  07/11/2019 FINDINGS: Lower chest: Coronary stent. Transvenous lead extends towards the RV apex. No pleural or pericardial effusion. Visualized lung bases clear. Hepatobiliary: No focal liver abnormality is seen. Status post cholecystectomy. No biliary dilatation. Pancreas: Unremarkable. No pancreatic ductal dilatation or surrounding inflammatory changes. Spleen: Normal in size without focal abnormality. Adrenals/Urinary Tract: Adrenal glands unremarkable. Bilateral urolithiasis, largest stone 1.7 cm right lower pole collecting system. There is a 1 cm mid left calculus. No hydronephrosis. Stable 5.1 cm left lower pole probable cyst. Urinary bladder incompletely distended. Stomach/Bowel: Stomach is partially distended, unremarkable. The small bowel is nondilated. Normal appendix. The colon is nondilated, unremarkable. Vascular/Lymphatic: Moderate calcified aortoiliac plaque without aneurysm. No abdominal or pelvic adenopathy. Reproductive: Prostate enlargement with central calcifications. Other: No ascites.  Right pelvic phleboliths.  No free air. Musculoskeletal: Exuberant anterior endplate spurring in the thoracolumbar spine. No fracture or worrisome bone lesion. IMPRESSION: 1. No acute findings. 2. Nonobstructive bilateral urolithiasis. 3.  Aortic Atherosclerosis (ICD10-170.0). Electronically Signed   By: Lucrezia Europe M.D.   On: 02/22/2021 14:41   DG Chest Portable 1 View  Result Date: 02/22/2021 CLINICAL DATA:  Shortness of breath.  Lethargy and abdominal pain. EXAM: PORTABLE CHEST 1 VIEW  COMPARISON:  09/27/2019 FINDINGS: Patient has LEFT-sided AICD with leads to the RIGHT ventricle. The heart is mildly enlarged and stable in configuration. There are no focal consolidations or pleural effusions. No edema. IMPRESSION: Stable cardiomegaly. Electronically Signed   By: Nolon Nations M.D.   On: 02/22/2021 11:37    Scheduled Meds:  sodium chloride   Intravenous Once   atorvastatin  80 mg Oral QPM   carvedilol  12.5 mg Oral BID WC   cinacalcet  30 mg Oral Q supper   fluticasone  2 spray Each Nare Daily   furosemide  40 mg Oral Daily   gabapentin  600 mg Oral TID   ivabradine  5 mg Oral BID WC   potassium citrate  10 mEq Oral Daily   Continuous Infusions:  iron sucrose       LOS: 0 days    Time spent: 35 minutes.   Barton Dubois, MD [facility]   To contact the attending provider between 7A-7P or the covering provider during after hours 7P-7A, please log into the web site [email or web address] and access using universal [place] password for that web site. If you do not have the password, please call the hospital operator.  02/23/2021, 5:43 PM

## 2021-02-23 NOTE — Consult Note (Signed)
Referring Provider: Loma Boston, DO Primary Care Physician:  Lindell Spar, MD Primary Gastroenterologist:  Dr. Laural Golden  Reason for Consultation:    Iron deficiency anemia.  HPI:   Patient is 65 year old African-American male with coronary artery disease status post angioplasty and stenting November 2020, ischemic cardiomyopathy who has ICD implanted in April 2021, chronic kidney disease hypertension and prediabetes who presented to emergency room yesterday with progressive weakness and exertional dyspnea.  He recalls that his symptoms started about a month ago and and he could walk to the bathroom.he did not experience chest pain shortness of breath or diaphoresis.  He therefore came to emergency room for evaluation.  His H&H was 4.6 and 15.7.  His stool was brown and guaiac negative.  Iron studies confirmed iron deficiency anemia.  Serum B12 level was normal. Patient was admitted to hospitalist service.  He has received 4 units of PRBCs.  Hemoglobin is up to 7.3 g.  He feels much better.  Patient says he has never had colonoscopy. He denies heartburn nausea vomiting dysphagia abdominal pain melena or rectal bleeding.  His bowels move daily.  He also denies hematuria.  His appetite is good and his weight has been stable.  He is not sure how long he has been on meloxicam.  He is also on Brilinta. Patient says he is starving and wants to eat.  He does not want to continue with clear liquids or take a prep for colonoscopy. Family history is negative for colon carcinoma.  Patient is never married.  He does not have any children.  He does not drink alcohol.  He used to smoke cigarettes about half a pack a day for 40 years but quit in November 2020 when he had MI.  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  His father died of MI at age 88.   He has 2 siblings and they are in good health.     Past Medical History:  Diagnosis Date   Acute ST elevation myocardial infarction (STEMI) due to occlusion of mid portion of left anterior descending (LAD) coronary artery ([place]) 05/10/7937   Acute systolic heart failure (HCC)    Arthritis    CHF (congestive heart failure) (HCC)    CKD (chronic kidney disease) stage 4, GFR 15-29 ml/min (HCC)    Dyspnea    with exertionm periodically   Heart attack ([place]) 03/15/2019   anterior MI    History of kidney stones    Hyperparathyroidism ([place])    Hypertension 2002   Ischemic cardiomyopathy    Left knee pain    Syncope 04/04/2019   after blood draw   Vitamin D deficiency     Past Surgical History:  Procedure Laterality Date   AMPUTATION TOE Left 1991   2 digit of left foot   CHOLECYSTECTOMY     COLONOSCOPY     CORONARY/GRAFT ACUTE MI REVASCULARIZATION N/A 03/12/2019   Procedure: Coronary/Graft Acute MI Revascularization;  Surgeon: Belva Crome, MD;  Location: [facility];  Service: Cardiovascular;  Laterality: N/A;   ICD IMPLANT N/A 08/24/2019   Procedure: ICD IMPLANT;  Surgeon: Evans Lance, MD;  Location: [facility];  Service: Cardiovascular;  Laterality: N/A;   IR URETERAL STENT LEFT NEW ACCESS W/O SEP NEPHROSTOMY CATH  09/26/2019   LEFT HEART CATH AND CORONARY ANGIOGRAPHY N/A 03/12/2019   Procedure: LEFT HEART CATH AND CORONARY ANGIOGRAPHY;  Surgeon: Belva Crome, MD;  Location: [facility];  Service: Cardiovascular;  Laterality: N/A;   LITHOTRIPSY Right    NEPHROLITHOTOMY Left 09/26/2019   Procedure: LEFT NEPHROLITHOTOMY PERCUTANEOUS;  Surgeon: Irine Seal, MD;  Location: [facility];  Service: Urology;  Laterality: Left;    Prior to Admission medications   Medication Sig Start Date End Date Taking? Authorizing Provider  allopurinol (ZYLOPRIM) 100 MG tablet Take 1 tablet by mouth daily. 01/22/21  Yes [provider]  Alcohol Swabs (B-D SINGLE USE SWABS  REGULAR) PADS USE AS DIRECTED Patient not taking: Reported on 12/31/2020 05/02/20   Fayrene Helper, MD  allopurinol (ZYLOPRIM) 100 MG tablet Take 100 mg by mouth daily.  04/25/19 06/27/20  [provider]  aspirin 81 MG chewable tablet Chew 1 tablet (81 mg total) by mouth daily. 03/16/19   Cheryln Manly, NP  atorvastatin (LIPITOR) 80 MG tablet TAKE 1 TABLET BY MOUTH DAILY AT 6PM Patient taking differently: Take 80 mg by mouth daily. 12/17/20   Fayrene Helper, MD  blood glucose meter kit and supplies Dispense based on patient and insurance preference. Use up to four times daily as directed. (FOR ICD-10 E10.9, E11.9). 03/04/20   Perlie Mayo, NP  BRILINTA 60 MG TABS tablet Take 1 tablet by mouth twice daily 10/02/20   Bensimhon, Shaune Pascal, MD  carvedilol (COREG) 12.5 MG tablet TAKE 1 TABLET BY MOUTH TWICE DAILY WITH MEALS 11/26/20   Bensimhon, Shaune Pascal, MD  cinacalcet (SENSIPAR) 30 MG tablet Take 30 mg by mouth daily. 12/28/20   [provider]  colchicine 0.6 MG tablet Take 0.6 mg by mouth daily. 05/15/20   [provider]  fluticasone (FLONASE) 50 MCG/ACT nasal spray Place 2 sprays into both nostrils daily. 08/28/20   Lindell Spar, MD  furosemide (LASIX) 40 MG tablet Take 1 tablet (40 mg total) by mouth daily. 10/10/20   Bensimhon, Shaune Pascal, MD  gabapentin (NEURONTIN) 600 MG tablet Take 1 tablet (600 mg total) by mouth 3 (three) times daily. 08/22/20 08/22/21  Raulkar, Clide Deutscher, MD  HYDROcodone-acetaminophen (NORCO) 10-325 MG tablet Take 1 tablet by mouth 2 (two) times daily as needed. 03/12/20   Mcarthur Rossetti, MD  ivabradine (CORLANOR) 5 MG TABS tablet Take 1 tablet (5 mg total) by mouth 2 (two) times daily with a meal. 06/03/20   Rosita Fire, Brittainy M, PA-C  meloxicam (MOBIC) 15 MG tablet TAKE 1 TABLET BY MOUTH ONCE A DAY. 01/21/21   Raulkar, Clide Deutscher, MD  nitroGLYCERIN (NITROSTAT) 0.4 MG SL tablet Place 1 tablet (0.4 mg total) under the tongue every 5  (five) minutes as needed. 04/11/19   Corum, Rex Kras, MD  potassium citrate (UROCIT-K) 10 MEQ (1080 MG) SR tablet Take 1 tablet by mouth daily. 10/11/19   [provider]  promethazine-dextromethorphan (PROMETHAZINE-DM) 6.25-15 MG/5ML syrup Take 5 mLs by mouth 4 (four) times daily as needed for cough. Patient not taking: Reported on 12/31/2020 04/26/20   Faustino Congress, NP  sodium bicarbonate 650 MG tablet Take 650 mg by mouth 3 (three) times daily. Patient not taking: Reported on 12/31/2020    [provider]  TRUEplus Lancets 30G MISC Use as directed to check blood sugar daily. 03/04/20   Fayrene Helper, MD    Current Facility-Administered Medications  Medication Dose Route Frequency Provider Last Rate Last Admin  0.9 %  sodium chloride infusion (Manually program via Guardrails IV Fluids)   Intravenous Once Adefeso, Oladapo, DO       atorvastatin (LIPITOR) tablet 80 mg  80 mg Oral QPM Truett Mainland, DO   80 mg at 02/22/21 1839   carvedilol (COREG) tablet 12.5 mg  12.5 mg Oral BID WC Truett Mainland, DO   12.5 mg at 02/23/21 0831   cinacalcet (SENSIPAR) tablet 30 mg  30 mg Oral Q supper Stinson, Jacob J, DO       dicyclomine (BENTYL) capsule 10 mg  10 mg Oral TID PRN Truett Mainland, DO       fluticasone (FLONASE) 50 MCG/ACT nasal spray 2 spray  2 spray Each Nare Daily Truett Mainland, DO       furosemide (LASIX) tablet 40 mg  40 mg Oral Daily Truett Mainland, DO   40 mg at 02/23/21 0831   gabapentin (NEURONTIN) capsule 600 mg  600 mg Oral TID Truett Mainland, DO   600 mg at 02/23/21 0831   HYDROcodone-acetaminophen (NORCO) 10-325 MG per tablet 1 tablet  1 tablet Oral BID PRN Truett Mainland, DO   1 tablet at 02/23/21 1610   ivabradine (CORLANOR) tablet 5 mg  5 mg Oral BID WC Truett Mainland, DO   5 mg at 02/23/21 1029   ondansetron (ZOFRAN) tablet 4 mg  4 mg Oral Q6H PRN Truett Mainland, DO       Or   ondansetron [facility]) injection 4 mg  4 mg Intravenous Q6H  PRN Truett Mainland, DO   4 mg at 02/22/21 2209   potassium citrate (UROCIT-K) SR tablet 10 mEq  10 mEq Oral Daily Truett Mainland, DO   10 mEq at 02/23/21 9604    Allergies as of 02/22/2021   (No Known Allergies)    Family History  Problem Relation Age of Onset   Heart failure Mother    Heart disease Mother    Cancer Mother        breast   Heart attack Father    Hypertension Father    Heart disease Father    Diabetes Brother     Social History   Socioeconomic History   Marital status: Single    Spouse name: Not on file   Number of children: 0   Years of education: Not on file   Highest education level: Some college, no degree  Occupational History   Occupation: retired  Tobacco Use   Smoking status: Former    Packs/day: 0.50    Years: 40.00    Pack years: 20.00    Types: Cigarettes    Quit date: 03/12/2019    Years since quitting: 1.9   Smokeless tobacco: Never  Vaping Use   Vaping Use: Never used  Substance and Sexual Activity   Alcohol use: No   Drug use: No   Sexual activity: Not Currently  Other Topics Concern   Not on file  Social History Narrative   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       Enjoys: fishing, shopping, working around the house       Diet: working on changes, avoiding fried foods, increased veggies   Caffeine: teas some daily   Water: 6-8 cups daily       Wears seat belt    Does not use phone while driving    Oceanographer at home    Social Determinants of Health  Financial Resource Strain: Low Risk    Difficulty of Paying Living Expenses: Not hard at all  Food Insecurity: No Food Insecurity   Worried About Charity fundraiser in the Last Year: Never true   Ran Out of Food in the Last Year: Never true  Transportation Needs: No Transportation Needs   Lack of Transportation (Medical): No   Lack of Transportation (Non-Medical): No  Physical Activity: Insufficiently Active   Days of Exercise per Week: 7 days   Minutes of  Exercise per Session: 10 min  Stress: No Stress Concern Present   Feeling of Stress : Not at all  Social Connections: Unknown   Frequency of Communication with Friends and Family: More than three times a week   Frequency of Social Gatherings with Friends and Family: More than three times a week   Attends Religious Services: More than 4 times per year   Active Member of Clubs or Organizations: No   Attends Music therapist: Never   Marital Status: Not on file  Intimate Partner Violence: Not At Risk   Fear of Current or Ex-Partner: No   Emotionally Abused: No   Physically Abused: No   Sexually Abused: No    Review of Systems: See HPI, otherwise normal ROS  Physical Exam: Temp:  [97.5 F (36.4 C)-98.4 F (36.9 C)] 98 F (36.7 C) (10/23 0642) Pulse Rate:  [52-86] 54 (10/23 0831) Resp:  [12-22] 17 (10/23 0642) BP: (99-134)/(66-95) 112/71 (10/23 0831) SpO2:  [71 %-100 %] 100 % (10/23 0642) Last BM Date: 02/21/21  Patient is alert and in no acute distress. Conjunctiva is pale.  Sclera is nonicteric. Oropharyngeal mucosa is normal. He has upper and lower dentures in place. No neck masses or thyromegaly noted. Cardiac exam regular rhythm normal S1 and S2.  No murmur or gallop noted. Auscultation of lungs reveal vesicular breath sounds bilaterally. Abdomen is full.  He has scars across upper abdomen from prior cholecystectomy.  Bowel sounds are normal.  On palpation is soft and nontender with organomegaly or masses. He does not have clubbing or koilonychia. He does not have peripheral edema either.   Lab Results: Recent Labs    02/22/21 1100 02/23/21 0213 02/23/21 0411 02/23/21 1026  WBC 7.2  --   --   --   HGB 4.6* 6.6* 6.5* 7.3*  HCT 15.7* 20.9* 20.7* 23.3*  PLT 335  --   --   --    BMET Recent Labs    02/22/21 1100  NA 138  K 4.1  CL 111  CO2 20*  GLUCOSE 142*  BUN 37*  CREATININE 4.18*  CALCIUM 9.0   LFT Recent Labs    02/22/21 1100  PROT  7.1  ALBUMIN 3.6  AST 23  ALT 11  ALKPHOS 106  BILITOT 0.9   Serum iron 8, TIBC 345 and saturation 2% Serum ferritin 5 Serum folate 8.6 Serum B12 23  Studies/Results: CT ABDOMEN PELVIS WO CONTRAST  Result Date: 02/22/2021 CLINICAL DATA:  Abdominal pain EXAM: CT ABDOMEN AND PELVIS WITHOUT CONTRAST TECHNIQUE: Multidetector CT imaging of the abdomen and pelvis was performed following the standard protocol without IV contrast. COMPARISON:  07/11/2019 FINDINGS: Lower chest: Coronary stent. Transvenous lead extends towards the RV apex. No pleural or pericardial effusion. Visualized lung bases clear. Hepatobiliary: No focal liver abnormality is seen. Status post cholecystectomy. No biliary dilatation. Pancreas: Unremarkable. No pancreatic ductal dilatation or surrounding inflammatory changes. Spleen: Normal in size without focal abnormality. Adrenals/Urinary Tract: Adrenal  glands unremarkable. Bilateral urolithiasis, largest stone 1.7 cm right lower pole collecting system. There is a 1 cm mid left calculus. No hydronephrosis. Stable 5.1 cm left lower pole probable cyst. Urinary bladder incompletely distended. Stomach/Bowel: Stomach is partially distended, unremarkable. The small bowel is nondilated. Normal appendix. The colon is nondilated, unremarkable. Vascular/Lymphatic: Moderate calcified aortoiliac plaque without aneurysm. No abdominal or pelvic adenopathy. Reproductive: Prostate enlargement with central calcifications. Other: No ascites.  Right pelvic phleboliths.  No free air. Musculoskeletal: Exuberant anterior endplate spurring in the thoracolumbar spine. No fracture or worrisome bone lesion. IMPRESSION: 1. No acute findings. 2. Nonobstructive bilateral urolithiasis. 3.  Aortic Atherosclerosis (ICD10-170.0). Electronically Signed   By: Lucrezia Europe M.D.   On: 02/22/2021 14:41   DG Chest Portable 1 View  Result Date: 02/22/2021 CLINICAL DATA:  Shortness of breath.  Lethargy and abdominal pain.  EXAM: PORTABLE CHEST 1 VIEW COMPARISON:  09/27/2019 FINDINGS: Patient has LEFT-sided AICD with leads to the RIGHT ventricle. The heart is mildly enlarged and stable in configuration. There are no focal consolidations or pleural effusions. No edema. IMPRESSION: Stable cardiomegaly. Electronically Signed   By: Nolon Nations M.D.   On: 02/22/2021 11:37    Abdominal pelvic CT reviewed.  No abnormality to stomach or bowel.  Assessment;  Patient is 65 year old African-American male with coronary artery disease ischemic cardiomyopathy who is on Ticagrelor, low-dose aspirin as well as meloxicam who presents with exertional dyspnea and progressive weakness and found to be profoundly anemic.  No history of melena or rectal bleeding or hematuria but iron studies confirm iron deficiency anemia. Patient has received 4 units of PRBCs and his hemoglobin has increased from 4.6g to 7.3 g and he feels much better.  Given that he is on an NSAID and 2 antiplatelet agents he could have an losing blood from his GI track intermittently.  Differential diagnosis includes peptic ulcer disease or colonic neoplasm.  Iron malabsorption is also possibility but he does not have any symptoms to suggest malabsorption. Patient would benefit from diagnostic esophagogastroduodenoscopy and colonoscopy but he is not interested in having procedures tomorrow.   As discussed with Dr. Dyann Kief since he is not actively bleeding he could be evaluated in an outpatient basis. However I would recommend that he should refrain from using OTC NSAIDs since he is on 2 antiplatelet agents.  Recommendations;  Patient advised not to take any NSAIDs. Diagnostic esophagogastroduodenoscopy followed by colonoscopy to be scheduled when patient is ready.  Discussed with Dr. Barton Dubois.   LOS: 0 days   Robin Petrakis  02/23/2021, 12:04 PM

## 2021-02-23 NOTE — Progress Notes (Signed)
Patient's hemoglobin 6.5. MD ordered another 1 unit of blood to be transfused. Retrieved unit of blood from lab but was unable to scan/find the released blood in Epic. Spoke with lab who advised to bring the unit of blood back to them and they would reissue another bag. Did this, retrieved second/replacement bag and had same issue. Returning unit of blood to lab for a second time.

## 2021-02-24 ENCOUNTER — Observation Stay (HOSPITAL_COMMUNITY): Payer: Medicare Other

## 2021-02-24 DIAGNOSIS — D649 Anemia, unspecified: Secondary | ICD-10-CM | POA: Diagnosis not present

## 2021-02-24 DIAGNOSIS — I255 Ischemic cardiomyopathy: Secondary | ICD-10-CM | POA: Diagnosis not present

## 2021-02-24 DIAGNOSIS — I5022 Chronic systolic (congestive) heart failure: Secondary | ICD-10-CM | POA: Diagnosis not present

## 2021-02-24 DIAGNOSIS — N184 Chronic kidney disease, stage 4 (severe): Secondary | ICD-10-CM | POA: Diagnosis not present

## 2021-02-24 DIAGNOSIS — I2510 Atherosclerotic heart disease of native coronary artery without angina pectoris: Secondary | ICD-10-CM | POA: Diagnosis not present

## 2021-02-24 LAB — BASIC METABOLIC PANEL
Anion gap: 7 (ref 5–15)
BUN: 36 mg/dL — ABNORMAL HIGH (ref 8–23)
CO2: 21 mmol/L — ABNORMAL LOW (ref 22–32)
Calcium: 8.8 mg/dL — ABNORMAL LOW (ref 8.9–10.3)
Chloride: 108 mmol/L (ref 98–111)
Creatinine, Ser: 3.89 mg/dL — ABNORMAL HIGH (ref 0.61–1.24)
GFR, Estimated: 17 mL/min — ABNORMAL LOW (ref >60)
Glucose, Bld: 126 mg/dL — ABNORMAL HIGH (ref 70–99)
Potassium: 4.1 mmol/L (ref 3.5–5.1)
Sodium: 136 mmol/L (ref 135–145)

## 2021-02-24 LAB — BPAM RBC
Blood Product Expiration Date: 202211152359
Blood Product Expiration Date: 202211192359
Blood Product Expiration Date: 202211192359
Blood Product Expiration Date: 202211192359
Blood Product Expiration Date: 202211192359
ISSUE DATE / TIME: 202210221353
ISSUE DATE / TIME: 202210221730
ISSUE DATE / TIME: 202210222206
ISSUE DATE / TIME: 202210230526
ISSUE DATE / TIME: 202210230608
Unit Type and Rh: 6200
Unit Type and Rh: 6200
Unit Type and Rh: 6200
Unit Type and Rh: 6200
Unit Type and Rh: 6200

## 2021-02-24 LAB — TYPE AND SCREEN
ABO/RH(D): A POS
Antibody Screen: NEGATIVE
Unit division: 0
Unit division: 0
Unit division: 0
Unit division: 0
Unit division: 0

## 2021-02-24 LAB — ECHOCARDIOGRAM COMPLETE
AR max vel: 2.28 cm2
AV Area VTI: 2.27 cm2
AV Area mean vel: 2.17 cm2
AV Mean grad: 5 mmHg
AV Peak grad: 9.6 mmHg
Ao pk vel: 1.55 m/s
Area-P 1/2: 4.52 cm2
Calc EF: 31.1 %
Height: 72 in
MV VTI: 1.71 cm2
S' Lateral: 4.9 cm
Single Plane A2C EF: 30.9 %
Single Plane A4C EF: 30.4 %
Weight: 3168 oz

## 2021-02-24 LAB — CUP PACEART REMOTE DEVICE CHECK
Battery Remaining Longevity: 174 mo
Battery Remaining Longevity: 174 mo
Battery Remaining Percentage: 100 %
Battery Remaining Percentage: 100 %
Brady Statistic RV Percent Paced: 0 %
Brady Statistic RV Percent Paced: 0 %
Date Time Interrogation Session: 20221021101900
Date Time Interrogation Session: 20221021114000
HighPow Impedance: 66 Ohm
HighPow Impedance: 66 Ohm
Implantable Lead Implant Date: 20210422
Implantable Lead Implant Date: 20210422
Implantable Lead Location: 753860
Implantable Lead Location: 753860
Implantable Lead Model: 138
Implantable Lead Model: 138
Implantable Lead Serial Number: 302700
Implantable Lead Serial Number: 302700
Implantable Pulse Generator Implant Date: 20210422
Implantable Pulse Generator Implant Date: 20210422
Lead Channel Impedance Value: 400 Ohm
Lead Channel Impedance Value: 400 Ohm
Lead Channel Setting Pacing Amplitude: 2.5 V
Lead Channel Setting Pacing Amplitude: 2.5 V
Lead Channel Setting Pacing Pulse Width: 0.4 ms
Lead Channel Setting Pacing Pulse Width: 0.4 ms
Lead Channel Setting Sensing Sensitivity: 0.5 mV
Lead Channel Setting Sensing Sensitivity: 0.5 mV
Pulse Gen Serial Number: 209495
Pulse Gen Serial Number: 209495

## 2021-02-24 LAB — CBC
HCT: 26.5 % — ABNORMAL LOW (ref 39.0–52.0)
Hemoglobin: 8.2 g/dL — ABNORMAL LOW (ref 13.0–17.0)
MCH: 28.2 pg (ref 26.0–34.0)
MCHC: 30.9 g/dL (ref 30.0–36.0)
MCV: 91.1 fL (ref 80.0–100.0)
Platelets: 282 10*3/uL (ref 150–400)
RBC: 2.91 MIL/uL — ABNORMAL LOW (ref 4.22–5.81)
RDW: 15.5 % (ref 11.5–15.5)
WBC: 7.1 10*3/uL (ref 4.0–10.5)
nRBC: 0 % (ref 0.0–0.2)

## 2021-02-24 NOTE — Progress Notes (Signed)
PROGRESS NOTE    [name]  GEX:[id number] DOB: Apr 09, 1956 DOA: 02/22/2021 PCP: Lindell Spar, MD    Chief Complaint  Patient presents with   Shortness of Breath    Brief Narrative:  As per H&P written by Dr. Nehemiah Settle on 02/22/2021 [name] is a 65 y.o. male with a history of hypertension, coronary artery disease, diabetes, chronic kidney disease, HFrEF with history of ischemic cardiomyopathy and EF of 20 to 25% on echocardiogram 07/2019.  Patient seen for worsening shortness of breath, weakness, fatigue over the past couple of weeks.  Worse with moving around and improved with rest.  He does report darker stools, but no frank melena or blood in stools.  Has not been taking large amounts of NSAIDs and does not drink alcohol.  Diet and appetite have been normal.  No other palliating or provoking factors.  He does complain of abdominal pain that is in his left upper abdomen abdomen, which started earlier today.  Denies diarrhea, constipation.   Emergency Department Course: Hemoglobin 6.  Creatinine 4.18. CT abdomen shows nephrolithiasis without obstruction. Troponins negative. BNP 157  Assessment & Plan: 1-acute on chronic anemia -Patient with chronic anemia in the setting of chronic renal failure (stage IV at baseline) -Presenting with symptomatic anemia and a hemoglobin level of 4.6 -Positive fecal occult blood test appreciated at time of admission.  No overt bleeding. -Case has been discussed with gastroenterology service who after initially discussing with patient was planning for potential outpatient work-up; after talking again with patient today patient would like to proceed with inpatient evaluation as he is frail and at risk for decompensation.  Plan is for endoscopy and colonoscopy in the next 24-48 hours. -Patient is currently hemodynamically stable. -Will follow GI service recommendations as part of diet and bowel prep. -Hemoglobin improved after 4 units PRBCs;  currently 8.3. -IV iron provided. -Continue to avoid the use of NSAIDs. -Patient will benefit of outpatient Epogen therapy per nephrology discretion.  2-Essential hypertension -Stable overall -Advised to follow low-sodium diet -Continue current antihypertensive agents.  3-chronic systolic heart failure -Last ejection fraction 20 to 25% -Continue to follow daily weights -Maintain adequate hydration and follow low-sodium diet -Continue outpatient follow-up with cardiology service -Condition is compensated currently. -Following request by anesthesia service 2D echo will be reassessed.  4-Chronic kidney disease, stage IV (severe) ([place]) -Continue outpatient follow-up with nephrology service -Renal function worse at time of admission in the setting of decreased perfusion from severe anemia -Has a stabilized and essentially back to baseline -Continue to follow renal function trend and avoid the use of nephrotoxic agents.  5-CAD in native artery -Patient denies chest pain -Given prior history of coronary disease transfusion threshold is for hemoglobin less than 7.5. -Continue the use of Brilinta at discharge.  DVT prophylaxis: SCDs Code Status: Full code Family Communication: No family at bedside; patient expressed that he will update his family on his own. Disposition:   Status is: Observation   Consultants:  Gastroenterology service  Procedures:  None  Antimicrobials: None   Subjective: No fever, no chest pain, no nausea, no vomiting.  Expressed no overt bleeding appreciated in his stools.  Good saturation on room air.  Patient describes intermittent left upper quadrant discomfort.  Overall feeling significant better after hemoglobin is stable above 8 range post-transfusion.  Objective: Vitals:   02/23/21 1617 02/23/21 2050 02/24/21 0428 02/24/21 1349  BP: 122/81 103/70 115/87 (!) 89/61  Pulse: (!) 53 (!) 54 (!) 53 (!) 53  Resp:  14 15 20   Temp:  98.1 F (36.7 C)  97.9 F (36.6 C) 97.8 F (36.6 C)  TempSrc:  Oral Oral Oral  SpO2:  100% 100% 100%  Weight:      Height:        Intake/Output Summary (Last 24 hours) at 02/24/2021 1617 Last data filed at 02/24/2021 1300 Gross per 24 hour  Intake 1080 ml  Output --  Net 1080 ml   Filed Weights   02/22/21 1009  Weight: 89.8 kg    Examination: General exam: Alert, awake, oriented x 3; denies chest pain, no nausea, no vomiting, no shortness of breath.  Reports intermittent left upper abdominal discomfort.  Denies overt bleeding. Respiratory system: No requiring oxygen supplementation; no using accessory muscle.  Good saturation on room air. Cardiovascular system:RRR. No rubs, gallops or JVD. Gastrointestinal system: Abdomen is nondistended, soft and nontender to palpation (patient expressed intermittent discomfort in his left upper quadrant).. No organomegaly or masses felt. Normal bowel sounds heard. Central nervous system: Alert and oriented. No focal neurological deficits. Extremities: No cyanosis, clubbing or edema. Skin: No petechiae. Psychiatry: Judgement and insight appear normal. Mood & affect appropriate.   Data Reviewed: I have personally reviewed following labs and imaging studies  CBC: Recent Labs  Lab 02/22/21 1100 02/23/21 0213 02/23/21 0411 02/23/21 1026 02/24/21 0344  WBC 7.2  --   --   --  7.1  NEUTROABS 5.7  --   --   --   --   HGB 4.6* 6.6* 6.5* 7.3* 8.2*  HCT 15.7* 20.9* 20.7* 23.3* 26.5*  MCV 92.9  --   --   --  91.1  PLT 335  --   --   --  102    Basic Metabolic Panel: Recent Labs  Lab 02/22/21 1100 02/24/21 1233  NA 138 136  K 4.1 4.1  CL 111 108  CO2 20* 21*  GLUCOSE 142* 126*  BUN 37* 36*  CREATININE 4.18* 3.89*  CALCIUM 9.0 8.8*    GFR: Estimated Creatinine Clearance: 21.3 mL/min (A) (by C-G formula based on SCr of 3.89 mg/dL (H)).  Liver Function Tests: Recent Labs  Lab 02/22/21 1100  AST 23  ALT 11  ALKPHOS 106  BILITOT 0.9  PROT 7.1   ALBUMIN 3.6    CBG: No results for input(s): GLUCAP in the last 168 hours.  Recent Results (from the past 240 hour(s))  Resp Panel by RT-PCR (Flu A&B, Covid) Nasopharyngeal Swab     Status: None   Collection Time: 02/22/21  1:10 PM   Specimen: Nasopharyngeal Swab; Nasopharyngeal(NP) swabs in vial transport medium  Result Value Ref Range Status   SARS Coronavirus 2 by RT PCR NEGATIVE NEGATIVE Final    Comment: (NOTE) SARS-CoV-2 target nucleic acids are NOT DETECTED.  The SARS-CoV-2 RNA is generally detectable in upper respiratory specimens during the acute phase of infection. The lowest concentration of SARS-CoV-2 viral copies this assay can detect is 138 copies/mL. A negative result does not preclude SARS-Cov-2 infection and should not be used as the sole basis for treatment or other patient management decisions. A negative result may occur with  improper specimen collection/handling, submission of specimen other than nasopharyngeal swab, presence of viral mutation(s) within the areas targeted by this assay, and inadequate number of viral copies(<138 copies/mL). A negative result must be combined with clinical observations, patient history, and epidemiological information. The expected result is Negative.  Fact Sheet for Patients:  EntrepreneurPulse.com.au  Fact Sheet for Healthcare Providers:  IncredibleEmployment.be  This test is no t yet approved or cleared by the [place] and  has been authorized for detection and/or diagnosis of SARS-CoV-2 by FDA under an Emergency Use Authorization (EUA). This EUA will remain  in effect (meaning this test can be used) for the duration of the COVID-19 declaration under Section 564(b)(1) of the Act, 21 U.S.C.section 360bbb-3(b)(1), unless the authorization is terminated  or revoked sooner.       Influenza A by PCR NEGATIVE NEGATIVE Final   Influenza B by PCR NEGATIVE NEGATIVE Final     Comment: (NOTE) The Xpert Xpress SARS-CoV-2/FLU/RSV plus assay is intended as an aid in the diagnosis of influenza from Nasopharyngeal swab specimens and should not be used as a sole basis for treatment. Nasal washings and aspirates are unacceptable for Xpert Xpress SARS-CoV-2/FLU/RSV testing.  Fact Sheet for Patients: EntrepreneurPulse.com.au  Fact Sheet for Healthcare Providers: IncredibleEmployment.be  This test is not yet approved or cleared by the [place] FDA and has been authorized for detection and/or diagnosis of SARS-CoV-2 by FDA under an Emergency Use Authorization (EUA). This EUA will remain in effect (meaning this test can be used) for the duration of the COVID-19 declaration under Section 564(b)(1) of the Act, 21 U.S.C. section 360bbb-3(b)(1), unless the authorization is terminated or revoked.  Performed at [facility], [address], [place], [place] [address]      Radiology Studies: No results found.  Scheduled Meds:  sodium chloride   Intravenous Once   atorvastatin  80 mg Oral QPM   carvedilol  12.5 mg Oral BID WC   cinacalcet  30 mg Oral Q supper   fluticasone  2 spray Each Nare Daily   furosemide  40 mg Oral Daily   gabapentin  600 mg Oral TID   ivabradine  5 mg Oral BID WC   potassium citrate  10 mEq Oral Daily   Continuous Infusions:   LOS: 0 days    Time spent: 35 minutes.   Barton Dubois, MD [facility]   To contact the attending provider between 7A-7P or the covering provider during after hours 7P-7A, please log into the web site [email or web address] and access using universal [place] password for that web site. If you do not have the password, please call the hospital operator.  02/24/2021, 4:17 PM

## 2021-02-24 NOTE — Progress Notes (Signed)
Subjective: No abdominal pain. Reports eating a fish sandwich with fries last night and vomiting thereafter. No chronic history of N/V. No dysphagia or GERD. No overt GI bleeding. Denies any unintentional weight loss or lack of appetite.   Objective: Vital signs in last 24 hours: Temp:  [97.9 F (36.6 C)-98.1 F (36.7 C)] 97.9 F (36.6 C) (10/24 0428) Pulse Rate:  [50-54] 53 (10/24 0428) Resp:  [14-17] 15 (10/24 0428) BP: (103-122)/(70-89) 115/87 (10/24 0428) SpO2:  [100 %] 100 % (10/24 0428) Last BM Date: 02/21/21 General:   Alert and oriented, pleasant Head:  Normocephalic and atraumatic. Abdomen:  Bowel sounds present, soft, non-tender, non-distended. No HSM or hernias noted. No rebound or guarding. No masses appreciated  Neurologic:  Alert and  oriented x4 Psych:  Normal mood and affect.  Intake/Output from previous day: 10/23 0701 - 10/24 0700 In: 677 [P.O.:352; Blood:325] Out: -  Intake/Output this shift: No intake/output data recorded.  Lab Results: Recent Labs    02/22/21 1100 02/23/21 0213 02/23/21 0411 02/23/21 1026 02/24/21 0344  WBC 7.2  --   --   --  7.1  HGB 4.6*   < > 6.5* 7.3* 8.2*  HCT 15.7*   < > 20.7* 23.3* 26.5*  PLT 335  --   --   --  282   < > = values in this interval not displayed.   BMET Recent Labs    02/22/21 1100  NA 138  K 4.1  CL 111  CO2 20*  GLUCOSE 142*  BUN 37*  CREATININE 4.18*  CALCIUM 9.0   LFT Recent Labs    02/22/21 1100  PROT 7.1  ALBUMIN 3.6  AST 23  ALT 11  ALKPHOS 106  BILITOT 0.9     Studies/Results: CT ABDOMEN PELVIS WO CONTRAST  Result Date: 02/22/2021 CLINICAL DATA:  Abdominal pain EXAM: CT ABDOMEN AND PELVIS WITHOUT CONTRAST TECHNIQUE: Multidetector CT imaging of the abdomen and pelvis was performed following the standard protocol without IV contrast. COMPARISON:  07/11/2019 FINDINGS: Lower chest: Coronary stent. Transvenous lead extends towards the RV apex. No pleural or pericardial effusion.  Visualized lung bases clear. Hepatobiliary: No focal liver abnormality is seen. Status post cholecystectomy. No biliary dilatation. Pancreas: Unremarkable. No pancreatic ductal dilatation or surrounding inflammatory changes. Spleen: Normal in size without focal abnormality. Adrenals/Urinary Tract: Adrenal glands unremarkable. Bilateral urolithiasis, largest stone 1.7 cm right lower pole collecting system. There is a 1 cm mid left calculus. No hydronephrosis. Stable 5.1 cm left lower pole probable cyst. Urinary bladder incompletely distended. Stomach/Bowel: Stomach is partially distended, unremarkable. The small bowel is nondilated. Normal appendix. The colon is nondilated, unremarkable. Vascular/Lymphatic: Moderate calcified aortoiliac plaque without aneurysm. No abdominal or pelvic adenopathy. Reproductive: Prostate enlargement with central calcifications. Other: No ascites.  Right pelvic phleboliths.  No free air. Musculoskeletal: Exuberant anterior endplate spurring in the thoracolumbar spine. No fracture or worrisome bone lesion. IMPRESSION: 1. No acute findings. 2. Nonobstructive bilateral urolithiasis. 3.  Aortic Atherosclerosis (ICD10-170.0). Electronically Signed   By: Lucrezia Europe M.D.   On: 02/22/2021 14:41   DG Chest Portable 1 View  Result Date: 02/22/2021 CLINICAL DATA:  Shortness of breath.  Lethargy and abdominal pain. EXAM: PORTABLE CHEST 1 VIEW COMPARISON:  09/27/2019 FINDINGS: Patient has LEFT-sided AICD with leads to the RIGHT ventricle. The heart is mildly enlarged and stable in configuration. There are no focal consolidations or pleural effusions. No edema. IMPRESSION: Stable cardiomegaly. Electronically Signed   By: Nolon Nations M.D.  On: 02/22/2021 11:37    Assessment/PLAN: 65 year old male with history of CAD s/p stent in 2020 and on Brilinta, ischemic cardiomyopathy s/p ICD in 2021, EF 20-25% in 2021, CKD, HTN, presenting with profound symptomatic anemia and Hgb 4.6. Heme negative  and without overt GI bleeding. Received 4 units PRBCs with improvement in Hgb to 8.2. Symptomatically improved.  No prior colonoscopy or EGD; he also denies any family history of colorectal cancer. No chronic history of concerning upper or lower GI signs/symptoms. Initially, he was hesitant to pursue procedures while inpatient but now desires to move forward.   As of note, he did have isolated incidence of vomiting yesterday evening after eating fish sandwich from a local fast food location.  Brilinta has been on hold since admission 10/22. Will change to clear liquid diet for this morning and remainder of today. NPO after midnight. Tentatively plan on colonoscopy/EGD on 02/25/21. Discussed risks and benefits with patient, who stated understanding and desire to proceed.   Annitta Needs, PhD, ANP-BC [facility]    LOS: 0 days    02/24/2021, 9:19 AM

## 2021-02-24 NOTE — Progress Notes (Signed)
*  PRELIMINARY RESULTS* Echocardiogram 2D Echocardiogram has been performed.  [name] 02/24/2021, 3:22 PM

## 2021-02-24 NOTE — Plan of Care (Signed)

## 2021-02-25 ENCOUNTER — Encounter: Payer: Medicare Other | Admitting: Internal Medicine

## 2021-02-25 DIAGNOSIS — K259 Gastric ulcer, unspecified as acute or chronic, without hemorrhage or perforation: Secondary | ICD-10-CM | POA: Diagnosis not present

## 2021-02-25 DIAGNOSIS — K2971 Gastritis, unspecified, with bleeding: Secondary | ICD-10-CM | POA: Diagnosis not present

## 2021-02-25 DIAGNOSIS — I700 Atherosclerosis of aorta: Secondary | ICD-10-CM | POA: Diagnosis not present

## 2021-02-25 DIAGNOSIS — K2950 Unspecified chronic gastritis without bleeding: Secondary | ICD-10-CM | POA: Diagnosis not present

## 2021-02-25 DIAGNOSIS — K5730 Diverticulosis of large intestine without perforation or abscess without bleeding: Secondary | ICD-10-CM | POA: Diagnosis not present

## 2021-02-25 DIAGNOSIS — K921 Melena: Secondary | ICD-10-CM | POA: Diagnosis not present

## 2021-02-25 DIAGNOSIS — Z7902 Long term (current) use of antithrombotics/antiplatelets: Secondary | ICD-10-CM | POA: Diagnosis not present

## 2021-02-25 DIAGNOSIS — D509 Iron deficiency anemia, unspecified: Secondary | ICD-10-CM | POA: Diagnosis not present

## 2021-02-25 DIAGNOSIS — Z87891 Personal history of nicotine dependence: Secondary | ICD-10-CM | POA: Diagnosis not present

## 2021-02-25 DIAGNOSIS — I2510 Atherosclerotic heart disease of native coronary artery without angina pectoris: Secondary | ICD-10-CM | POA: Diagnosis not present

## 2021-02-25 DIAGNOSIS — K2981 Duodenitis with bleeding: Secondary | ICD-10-CM | POA: Diagnosis not present

## 2021-02-25 DIAGNOSIS — R1012 Left upper quadrant pain: Secondary | ICD-10-CM | POA: Diagnosis not present

## 2021-02-25 DIAGNOSIS — D649 Anemia, unspecified: Secondary | ICD-10-CM | POA: Diagnosis not present

## 2021-02-25 DIAGNOSIS — E1121 Type 2 diabetes mellitus with diabetic nephropathy: Secondary | ICD-10-CM | POA: Diagnosis not present

## 2021-02-25 DIAGNOSIS — I10 Essential (primary) hypertension: Secondary | ICD-10-CM | POA: Diagnosis not present

## 2021-02-25 DIAGNOSIS — N400 Enlarged prostate without lower urinary tract symptoms: Secondary | ICD-10-CM | POA: Diagnosis present

## 2021-02-25 DIAGNOSIS — Z20822 Contact with and (suspected) exposure to covid-19: Secondary | ICD-10-CM | POA: Diagnosis not present

## 2021-02-25 DIAGNOSIS — Z23 Encounter for immunization: Secondary | ICD-10-CM | POA: Diagnosis not present

## 2021-02-25 DIAGNOSIS — E785 Hyperlipidemia, unspecified: Secondary | ICD-10-CM | POA: Diagnosis present

## 2021-02-25 DIAGNOSIS — K257 Chronic gastric ulcer without hemorrhage or perforation: Secondary | ICD-10-CM | POA: Diagnosis present

## 2021-02-25 DIAGNOSIS — K2980 Duodenitis without bleeding: Secondary | ICD-10-CM | POA: Diagnosis not present

## 2021-02-25 DIAGNOSIS — I255 Ischemic cardiomyopathy: Secondary | ICD-10-CM | POA: Diagnosis not present

## 2021-02-25 DIAGNOSIS — E1122 Type 2 diabetes mellitus with diabetic chronic kidney disease: Secondary | ICD-10-CM | POA: Diagnosis not present

## 2021-02-25 DIAGNOSIS — N184 Chronic kidney disease, stage 4 (severe): Secondary | ICD-10-CM | POA: Diagnosis not present

## 2021-02-25 DIAGNOSIS — I252 Old myocardial infarction: Secondary | ICD-10-CM | POA: Diagnosis not present

## 2021-02-25 DIAGNOSIS — D631 Anemia in chronic kidney disease: Secondary | ICD-10-CM | POA: Diagnosis not present

## 2021-02-25 DIAGNOSIS — I130 Hypertensive heart and chronic kidney disease with heart failure and stage 1 through stage 4 chronic kidney disease, or unspecified chronic kidney disease: Secondary | ICD-10-CM | POA: Diagnosis not present

## 2021-02-25 DIAGNOSIS — K644 Residual hemorrhoidal skin tags: Secondary | ICD-10-CM | POA: Diagnosis not present

## 2021-02-25 DIAGNOSIS — I5022 Chronic systolic (congestive) heart failure: Secondary | ICD-10-CM | POA: Diagnosis not present

## 2021-02-25 DIAGNOSIS — K5790 Diverticulosis of intestine, part unspecified, without perforation or abscess without bleeding: Secondary | ICD-10-CM | POA: Diagnosis present

## 2021-02-25 DIAGNOSIS — K2970 Gastritis, unspecified, without bleeding: Secondary | ICD-10-CM | POA: Diagnosis not present

## 2021-02-25 DIAGNOSIS — N179 Acute kidney failure, unspecified: Secondary | ICD-10-CM | POA: Diagnosis not present

## 2021-02-25 DIAGNOSIS — E213 Hyperparathyroidism, unspecified: Secondary | ICD-10-CM | POA: Diagnosis present

## 2021-02-25 LAB — CBC
HCT: 25.9 % — ABNORMAL LOW (ref 39.0–52.0)
Hemoglobin: 7.9 g/dL — ABNORMAL LOW (ref 13.0–17.0)
MCH: 28.3 pg (ref 26.0–34.0)
MCHC: 30.5 g/dL (ref 30.0–36.0)
MCV: 92.8 fL (ref 80.0–100.0)
Platelets: 242 10*3/uL (ref 150–400)
RBC: 2.79 MIL/uL — ABNORMAL LOW (ref 4.22–5.81)
RDW: 15.7 % — ABNORMAL HIGH (ref 11.5–15.5)
WBC: 7.3 10*3/uL (ref 4.0–10.5)
nRBC: 0.3 % — ABNORMAL HIGH (ref 0.0–0.2)

## 2021-02-25 LAB — BASIC METABOLIC PANEL
Anion gap: 6 (ref 5–15)
BUN: 37 mg/dL — ABNORMAL HIGH (ref 8–23)
CO2: 25 mmol/L (ref 22–32)
Calcium: 8.7 mg/dL — ABNORMAL LOW (ref 8.9–10.3)
Chloride: 108 mmol/L (ref 98–111)
Creatinine, Ser: 3.56 mg/dL — ABNORMAL HIGH (ref 0.61–1.24)
GFR, Estimated: 18 mL/min — ABNORMAL LOW (ref >60)
Glucose, Bld: 101 mg/dL — ABNORMAL HIGH (ref 70–99)
Potassium: 4.2 mmol/L (ref 3.5–5.1)
Sodium: 139 mmol/L (ref 135–145)

## 2021-02-25 MED ORDER — PEG 3350-KCL-NA BICARB-NACL 420 G PO SOLR
4000.0000 mL | Freq: Once | ORAL | Status: AC
  Administered 2021-02-25: 4000 mL via ORAL

## 2021-02-25 MED ORDER — PANTOPRAZOLE SODIUM 40 MG IV SOLR
40.0000 mg | Freq: Two times a day (BID) | INTRAVENOUS | Status: DC
  Administered 2021-02-25 – 2021-02-26 (×3): 40 mg via INTRAVENOUS
  Filled 2021-02-25 (×3): qty 40

## 2021-02-25 NOTE — Progress Notes (Signed)
PROGRESS NOTE    [name]  IOX:[id number] DOB: 05-03-1956 DOA: 02/22/2021 PCP: Lindell Spar, MD    Chief Complaint  Patient presents with   Shortness of Breath    Brief Narrative:  As per H&P written by Dr. Nehemiah Settle on 02/22/2021 [name] is a 65 y.o. male with a history of hypertension, coronary artery disease, diabetes, chronic kidney disease, HFrEF with history of ischemic cardiomyopathy and EF of 20 to 25% on echocardiogram 07/2019.  Patient seen for worsening shortness of breath, weakness, fatigue over the past couple of weeks.  Worse with moving around and improved with rest.  He does report darker stools, but no frank melena or blood in stools.  Has not been taking large amounts of NSAIDs and does not drink alcohol.  Diet and appetite have been normal.  No other palliating or provoking factors.  He does complain of abdominal pain that is in his left upper abdomen abdomen, which started earlier today.  Denies diarrhea, constipation.   Emergency Department Course: Hemoglobin 6.  Creatinine 4.18. CT abdomen shows nephrolithiasis without obstruction. Troponins negative. BNP 157  Assessment & Plan: 1-acute on chronic anemia -Patient with chronic anemia in the setting of chronic renal failure (stage IV at baseline) -Presenting with symptomatic anemia and a hemoglobin level of 4.6 -Positive fecal occult blood test appreciated at time of admission.  No overt bleeding. -Case has been discussed with gastroenterology service who after initially discussing with patient was planning for potential outpatient work-up; after talking again with patient today patient would like to proceed with inpatient evaluation as he is frail and at risk for decompensation.  Plan is for endoscopy and colonoscopy in the next 24 hours. -Patient is currently hemodynamically stable. -Will follow GI service recommendations  -N.p.o. after midnight. -Hemoglobin improved after 4 units PRBCs; currently  7.9. -IV iron also provided. -Continue to avoid the use of NSAIDs. -Patient will benefit of outpatient Epogen therapy per nephrology discretion.  2-Essential hypertension -Stable overall -Advised to follow low-sodium diet -Continue current antihypertensive agents.  3-chronic systolic heart failure -Last ejection fraction 20 to 25% -Continue to follow daily weights -Maintain adequate hydration and follow low-sodium diet -Continue outpatient follow-up with cardiology service -Condition remains compensated currently. -Repeat 2D echo on 02/24/2021 demonstrating ejection fraction of 30 to 35%.  No significant valvulopathy.  4-Chronic kidney disease, stage IV (severe) ([place]) -Continue outpatient follow-up with nephrology service -Renal function worse at time of admission in the setting of decreased perfusion from severe anemia -Has a stabilized and essentially back to baseline -Continue to follow renal function trend and avoid the use of nephrotoxic agents.  5-CAD in native artery -Patient denies chest pain -Given prior history of coronary disease transfusion threshold is for hemoglobin less than 7.5. -Continue the use of Brilinta at discharge.  DVT prophylaxis: SCDs Code Status: Full code Family Communication: No family at bedside; patient expressed that he will update his family on his own. Disposition:   Status is: Observation   Consultants:  Gastroenterology service  Procedures:  None  Antimicrobials: None   Subjective: No fever, no chest pain, no nausea, no vomiting, no shortness of breath.  Patient expressed no overt bleeding overnight.  Objective: Vitals:   02/25/21 0528 02/25/21 0827 02/25/21 1357 02/25/21 1730  BP: 107/74 103/66 103/77 119/81  Pulse: 61 61 62 (!) 57  Resp: 14  18   Temp: 98.6 F (37 C)  98.2 F (36.8 C)   TempSrc: Oral  Oral   SpO2: 99%  98%   Weight:      Height:        Intake/Output Summary (Last 24 hours) at 02/25/2021 1759 Last  data filed at 02/25/2021 1736 Gross per 24 hour  Intake 2400 ml  Output 600 ml  Net 1800 ml   Filed Weights   02/22/21 1009  Weight: 89.8 kg    Examination: General exam: Alert, awake, oriented x 3; no abdominal pain, no nausea or vomiting.  Patient reports no overt bleeding overnight.  In agreement to proceed with endoscopy and colonoscopy.  There is no chest pain, shortness of breath or orthopnea. Respiratory system: Clear to auscultation. Respiratory effort normal.  Good saturation on room air. Cardiovascular system:RRR. No rubs or gallops; no JVD. Gastrointestinal system: Abdomen is nondistended, soft and nontender. No organomegaly or masses felt. Normal bowel sounds heard. Central nervous system: Alert and oriented. No focal neurological deficits. Extremities: No cyanosis, clubbing or edema. Skin: No petechiae. Psychiatry: Judgement and insight appear normal. Mood & affect appropriate.    Data Reviewed: I have personally reviewed following labs and imaging studies  CBC: Recent Labs  Lab 02/22/21 1100 02/23/21 0213 02/23/21 0411 02/23/21 1026 02/24/21 0344 02/25/21 0805  WBC 7.2  --   --   --  7.1 7.3  NEUTROABS 5.7  --   --   --   --   --   HGB 4.6* 6.6* 6.5* 7.3* 8.2* 7.9*  HCT 15.7* 20.9* 20.7* 23.3* 26.5* 25.9*  MCV 92.9  --   --   --  91.1 92.8  PLT 335  --   --   --  282 671    Basic Metabolic Panel: Recent Labs  Lab 02/22/21 1100 02/24/21 1233 02/25/21 0805  NA 138 136 139  K 4.1 4.1 4.2  CL 111 108 108  CO2 20* 21* 25  GLUCOSE 142* 126* 101*  BUN 37* 36* 37*  CREATININE 4.18* 3.89* 3.56*  CALCIUM 9.0 8.8* 8.7*    GFR: Estimated Creatinine Clearance: 23.3 mL/min (A) (by C-G formula based on SCr of 3.56 mg/dL (H)).  Liver Function Tests: Recent Labs  Lab 02/22/21 1100  AST 23  ALT 11  ALKPHOS 106  BILITOT 0.9  PROT 7.1  ALBUMIN 3.6   CBG: No results for input(s): GLUCAP in the last 168 hours.  Recent Results (from the past 240  hour(s))  Resp Panel by RT-PCR (Flu A&B, Covid) Nasopharyngeal Swab     Status: None   Collection Time: 02/22/21  1:10 PM   Specimen: Nasopharyngeal Swab; Nasopharyngeal(NP) swabs in vial transport medium  Result Value Ref Range Status   SARS Coronavirus 2 by RT PCR NEGATIVE NEGATIVE Final    Comment: (NOTE) SARS-CoV-2 target nucleic acids are NOT DETECTED.  The SARS-CoV-2 RNA is generally detectable in upper respiratory specimens during the acute phase of infection. The lowest concentration of SARS-CoV-2 viral copies this assay can detect is 138 copies/mL. A negative result does not preclude SARS-Cov-2 infection and should not be used as the sole basis for treatment or other patient management decisions. A negative result may occur with  improper specimen collection/handling, submission of specimen other than nasopharyngeal swab, presence of viral mutation(s) within the areas targeted by this assay, and inadequate number of viral copies(<138 copies/mL). A negative result must be combined with clinical observations, patient history, and epidemiological information. The expected result is Negative.  Fact Sheet for Patients:  EntrepreneurPulse.com.au  Fact Sheet for Healthcare Providers:  IncredibleEmployment.be  This test is no  t yet approved or cleared by the [place] and  has been authorized for detection and/or diagnosis of SARS-CoV-2 by FDA under an Emergency Use Authorization (EUA). This EUA will remain  in effect (meaning this test can be used) for the duration of the COVID-19 declaration under Section 564(b)(1) of the Act, 21 U.S.C.section 360bbb-3(b)(1), unless the authorization is terminated  or revoked sooner.       Influenza A by PCR NEGATIVE NEGATIVE Final   Influenza B by PCR NEGATIVE NEGATIVE Final    Comment: (NOTE) The Xpert Xpress SARS-CoV-2/FLU/RSV plus assay is intended as an aid in the diagnosis of influenza from  Nasopharyngeal swab specimens and should not be used as a sole basis for treatment. Nasal washings and aspirates are unacceptable for Xpert Xpress SARS-CoV-2/FLU/RSV testing.  Fact Sheet for Patients: EntrepreneurPulse.com.au  Fact Sheet for Healthcare Providers: IncredibleEmployment.be  This test is not yet approved or cleared by the [place] FDA and has been authorized for detection and/or diagnosis of SARS-CoV-2 by FDA under an Emergency Use Authorization (EUA). This EUA will remain in effect (meaning this test can be used) for the duration of the COVID-19 declaration under Section 564(b)(1) of the Act, 21 U.S.C. section 360bbb-3(b)(1), unless the authorization is terminated or revoked.  Performed at [facility], [address] [place], [place] [address]     Radiology Studies: ECHOCARDIOGRAM COMPLETE  Result Date: 02/24/2021    ECHOCARDIOGRAM REPORT   Patient Name:   SANJITH SIWEK Date of Exam: 02/24/2021 Medical Rec #:  [id number]        Height:       72.0 in Accession #:    [id number]       Weight:       198.0 lb Date of Birth:  07/20/1955        BSA:          2.121 m Patient Age:    85 years         BP:           115/87 mmHg Patient Gender: M                HR:           61 bpm. Exam Location:  [facility] Procedure: 2D Echo, Cardiac Doppler and Color Doppler Indications:    Cardiomyopathy-Ischemic, CHF  History:        Patient has prior history of Echocardiogram examinations, most                 recent 07/05/2019. CHF, Previous Myocardial Infarction and CAD,                 Defibrillator, Signs/Symptoms:Shortness of Breath; Risk                 Factors:Hypertension, Dyslipidemia and Former Smoker.  Sonographer:    Wenda Low Referring Phys: [place]  1. LVEF is depressed with hypokinesis of the base / mid septal wall; akinesis of the distal inferior, distal septal, distal lateral and apical wall Compared to echo from  2021, LVEF is mildly improved. . Left ventricular ejection fraction, by estimation,  is 30 to 35%. The left ventricle has moderately decreased function. The left ventricular internal cavity size was moderately dilated. Left ventricular diastolic parameters are consistent with Grade II diastolic dysfunction (pseudonormalization).  2. Right ventricular systolic function is normal. The right ventricular size is normal. There is moderately elevated pulmonary artery systolic pressure.  3.  Left atrial size was moderately dilated.  4. Mild mitral valve regurgitation.  5. The aortic valve is normal in structure. Aortic valve regurgitation is not visualized. FINDINGS  Left Ventricle: LVEF is depressed with hypokinesis of the base / mid septal wall; akinesis of the distal inferior, distal septal, distal lateral and apical wall Compared to echo from 2021, LVEF is mildly improved. Left ventricular ejection fraction, by estimation, is 30 to 35%. The left ventricle has moderately decreased function. The left ventricular internal cavity size was moderately dilated. There is no left ventricular hypertrophy. Left ventricular diastolic parameters are consistent with Grade II  diastolic dysfunction (pseudonormalization). Right Ventricle: The right ventricular size is normal. Right vetricular wall thickness was not assessed. Right ventricular systolic function is normal. There is moderately elevated pulmonary artery systolic pressure. The tricuspid regurgitant velocity is  3.22 m/s, and with an assumed right atrial pressure of 8 mmHg, the estimated right ventricular systolic pressure is 10.2 mmHg. Left Atrium: Left atrial size was moderately dilated. Right Atrium: Right atrial size was normal in size. Pericardium: There is no evidence of pericardial effusion. Mitral Valve: There is mild thickening of the mitral valve leaflet(s). Mild mitral valve regurgitation. MV peak gradient, 4.1 mmHg. The mean mitral valve gradient is 1.0 mmHg.  Tricuspid Valve: The tricuspid valve is normal in structure. Tricuspid valve regurgitation is mild. Aortic Valve: The aortic valve is normal in structure. Aortic valve regurgitation is not visualized. Aortic valve mean gradient measures 5.0 mmHg. Aortic valve peak gradient measures 9.6 mmHg. Aortic valve area, by VTI measures 2.27 cm. Pulmonic Valve: The pulmonic valve was normal in structure. Pulmonic valve regurgitation is not visualized. Aorta: The aortic root is normal in size and structure. IAS/Shunts: No atrial level shunt detected by color flow Doppler. Additional Comments: A device lead is visualized.  LEFT VENTRICLE PLAX 2D LVIDd:         5.80 cm      Diastology LVIDs:         4.90 cm      LV e' medial:    5.33 cm/s LV PW:         1.10 cm      LV E/e' medial:  18.5 LV IVS:        1.00 cm      LV e' lateral:   5.11 cm/s LVOT diam:     2.10 cm      LV E/e' lateral: 19.3 LV SV:         78 LV SV Index:   37 LVOT Area:     3.46 cm  LV Volumes (MOD) LV vol d, MOD A2C: 178.0 ml LV vol d, MOD A4C: 184.0 ml LV vol s, MOD A2C: 123.0 ml LV vol s, MOD A4C: 128.0 ml LV SV MOD A2C:     55.0 ml LV SV MOD A4C:     184.0 ml LV SV MOD BP:      56.2 ml RIGHT VENTRICLE RV Basal diam:  3.75 cm RV Mid diam:    3.40 cm LEFT ATRIUM              Index        RIGHT ATRIUM           Index LA diam:        4.20 cm  1.98 cm/m   RA Area:     20.40 cm LA Vol (A2C):   118.0 ml 55.62 ml/m  RA Volume:   60.70 ml  28.61 ml/m LA Vol (A4C):   109.0 ml 51.38 ml/m LA Biplane Vol: 116.0 ml 54.68 ml/m  AORTIC VALVE                     PULMONIC VALVE AV Area (Vmax):    2.28 cm      PV Vmax:       0.98 m/s AV Area (Vmean):   2.17 cm      PV Peak grad:  3.8 mmHg AV Area (VTI):     2.27 cm AV Vmax:           155.00 cm/s AV Vmean:          106.000 cm/s AV VTI:            0.342 m AV Peak Grad:      9.6 mmHg AV Mean Grad:      5.0 mmHg LVOT Vmax:         102.00 cm/s LVOT Vmean:        66.300 cm/s LVOT VTI:          0.224 m LVOT/AV VTI ratio:  0.65  AORTA Ao Root diam: 2.80 cm Ao Asc diam:  2.90 cm MITRAL VALVE               TRICUSPID VALVE MV Area (PHT): 4.52 cm    TR Peak grad:   41.5 mmHg MV Area VTI:   1.71 cm    TR Vmax:        322.00 cm/s MV Peak grad:  4.1 mmHg MV Mean grad:  1.0 mmHg    SHUNTS MV Vmax:       1.01 m/s    Systemic VTI:  0.22 m MV Vmean:      50.0 cm/s   Systemic Diam: 2.10 cm MV Decel Time: 168 msec MV E velocity: 98.50 cm/s MV A velocity: 60.00 cm/s MV E/A ratio:  1.64 Dorris Carnes MD Electronically signed by Dorris Carnes MD Signature Date/Time: 02/24/2021/7:42:14 PM    Final     Scheduled Meds:  sodium chloride   Intravenous Once   atorvastatin  80 mg Oral QPM   carvedilol  12.5 mg Oral BID WC   cinacalcet  30 mg Oral Q supper   fluticasone  2 spray Each Nare Daily   furosemide  40 mg Oral Daily   gabapentin  600 mg Oral TID   ivabradine  5 mg Oral BID WC   pantoprazole (PROTONIX) IV  40 mg Intravenous Q12H   polyethylene glycol-electrolytes  4,000 mL Oral Once   potassium citrate  10 mEq Oral Daily   Continuous Infusions:   LOS: 0 days    Time spent: 35 minutes.   Barton Dubois, MD [facility]   To contact the attending provider between 7A-7P or the covering provider during after hours 7P-7A, please log into the web site [email or web address] and access using universal [place] password for that web site. If you do not have the password, please call the hospital operator.  02/25/2021, 5:59 PM

## 2021-02-25 NOTE — Progress Notes (Signed)
Subjective: Feeling well this morning.  He did have 1/2 piece of [place] toast this morning despite clear liquid diet, but nothing else.  Reports 3 days of intermittent mild, nagging type pain in the left upper quadrant lasting for about 10 minutes without associated nausea or vomiting.  Only occurs once a day, unclear if symptoms start after meals, but they do improve with passing gas.  Denies any significant history of reflux.  He does take meloxicam daily, rare ibuprofen use.  Denies BRBPR or melena this admission, but does report 2 episodes of black stools on October 17 and 18.  He thought this might have been secondary to chocolate Ensure.  Objective: Vital signs in last 24 hours: Temp:  [97.8 F (36.6 C)-98.6 F (37 C)] 98.6 F (37 C) (10/25 0528) Pulse Rate:  [53-61] 61 (10/25 0827) Resp:  [14-20] 14 (10/25 0528) BP: (89-107)/(61-74) 103/66 (10/25 0827) SpO2:  [99 %-100 %] 99 % (10/25 0528) Last BM Date: 02/21/21 General:   Alert and oriented, pleasant Head:  Normocephalic and atraumatic. Eyes:  No icterus, sclera clear. Conjuctiva pink.  Abdomen:  Bowel sounds present, soft, non-tender, non-distended. No HSM or hernias noted. No rebound or guarding. No masses appreciated  Msk:  Symmetrical without gross deformities. Normal posture. Extremities:  Without edema. Neurologic:  Alert and  oriented x4;  grossly normal neurologically. Skin:  Warm and dry, intact without significant lesions.  Psych: Normal mood and affect.  Intake/Output from previous day: 10/24 0701 - 10/25 0700 In: 1800 [P.O.:1800] Out: -  Intake/Output this shift: No intake/output data recorded.  Lab Results: Recent Labs    02/22/21 1100 02/23/21 0213 02/23/21 1026 02/24/21 0344 02/25/21 0805  WBC 7.2  --   --  7.1 7.3  HGB 4.6*   < > 7.3* 8.2* 7.9*  HCT 15.7*   < > 23.3* 26.5* 25.9*  PLT 335  --   --  282 242   < > = values in this interval not displayed.   BMET Recent Labs    02/22/21 1100  02/24/21 1233 02/25/21 0805  NA 138 136 139  K 4.1 4.1 4.2  CL 111 108 108  CO2 20* 21* 25  GLUCOSE 142* 126* 101*  BUN 37* 36* 37*  CREATININE 4.18* 3.89* 3.56*  CALCIUM 9.0 8.8* 8.7*   LFT Recent Labs    02/22/21 1100  PROT 7.1  ALBUMIN 3.6  AST 23  ALT 11  ALKPHOS 106  BILITOT 0.9    Studies/Results: ECHOCARDIOGRAM COMPLETE  Result Date: 02/24/2021    ECHOCARDIOGRAM REPORT   Patient Name:   ZAK GONDEK Date of Exam: 02/24/2021 Medical Rec #:  [id number]        Height:       72.0 in Accession #:    [id number]       Weight:       198.0 lb Date of Birth:  Sep 15, 1955        BSA:          2.121 m Patient Age:    65 years         BP:           115/87 mmHg Patient Gender: M                HR:           61 bpm. Exam Location:  [facility] Procedure: 2D Echo, Cardiac Doppler and Color Doppler Indications:    Cardiomyopathy-Ischemic, CHF  History:  Patient has prior history of Echocardiogram examinations, most                 recent 07/05/2019. CHF, Previous Myocardial Infarction and CAD,                 Defibrillator, Signs/Symptoms:Shortness of Breath; Risk                 Factors:Hypertension, Dyslipidemia and Former Smoker.  Sonographer:    Wenda Low Referring Phys: [place]  1. LVEF is depressed with hypokinesis of the base / mid septal wall; akinesis of the distal inferior, distal septal, distal lateral and apical wall Compared to echo from 2021, LVEF is mildly improved. . Left ventricular ejection fraction, by estimation,  is 30 to 35%. The left ventricle has moderately decreased function. The left ventricular internal cavity size was moderately dilated. Left ventricular diastolic parameters are consistent with Grade II diastolic dysfunction (pseudonormalization).  2. Right ventricular systolic function is normal. The right ventricular size is normal. There is moderately elevated pulmonary artery systolic pressure.  3. Left atrial size was moderately  dilated.  4. Mild mitral valve regurgitation.  5. The aortic valve is normal in structure. Aortic valve regurgitation is not visualized. FINDINGS  Left Ventricle: LVEF is depressed with hypokinesis of the base / mid septal wall; akinesis of the distal inferior, distal septal, distal lateral and apical wall Compared to echo from 2021, LVEF is mildly improved. Left ventricular ejection fraction, by estimation, is 30 to 35%. The left ventricle has moderately decreased function. The left ventricular internal cavity size was moderately dilated. There is no left ventricular hypertrophy. Left ventricular diastolic parameters are consistent with Grade II  diastolic dysfunction (pseudonormalization). Right Ventricle: The right ventricular size is normal. Right vetricular wall thickness was not assessed. Right ventricular systolic function is normal. There is moderately elevated pulmonary artery systolic pressure. The tricuspid regurgitant velocity is  3.22 m/s, and with an assumed right atrial pressure of 8 mmHg, the estimated right ventricular systolic pressure is 29.5 mmHg. Left Atrium: Left atrial size was moderately dilated. Right Atrium: Right atrial size was normal in size. Pericardium: There is no evidence of pericardial effusion. Mitral Valve: There is mild thickening of the mitral valve leaflet(s). Mild mitral valve regurgitation. MV peak gradient, 4.1 mmHg. The mean mitral valve gradient is 1.0 mmHg. Tricuspid Valve: The tricuspid valve is normal in structure. Tricuspid valve regurgitation is mild. Aortic Valve: The aortic valve is normal in structure. Aortic valve regurgitation is not visualized. Aortic valve mean gradient measures 5.0 mmHg. Aortic valve peak gradient measures 9.6 mmHg. Aortic valve area, by VTI measures 2.27 cm. Pulmonic Valve: The pulmonic valve was normal in structure. Pulmonic valve regurgitation is not visualized. Aorta: The aortic root is normal in size and structure. IAS/Shunts: No atrial  level shunt detected by color flow Doppler. Additional Comments: A device lead is visualized.  LEFT VENTRICLE PLAX 2D LVIDd:         5.80 cm      Diastology LVIDs:         4.90 cm      LV e' medial:    5.33 cm/s LV PW:         1.10 cm      LV E/e' medial:  18.5 LV IVS:        1.00 cm      LV e' lateral:   5.11 cm/s LVOT diam:     2.10 cm  LV E/e' lateral: 19.3 LV SV:         78 LV SV Index:   37 LVOT Area:     3.46 cm  LV Volumes (MOD) LV vol d, MOD A2C: 178.0 ml LV vol d, MOD A4C: 184.0 ml LV vol s, MOD A2C: 123.0 ml LV vol s, MOD A4C: 128.0 ml LV SV MOD A2C:     55.0 ml LV SV MOD A4C:     184.0 ml LV SV MOD BP:      56.2 ml RIGHT VENTRICLE RV Basal diam:  3.75 cm RV Mid diam:    3.40 cm LEFT ATRIUM              Index        RIGHT ATRIUM           Index LA diam:        4.20 cm  1.98 cm/m   RA Area:     20.40 cm LA Vol (A2C):   118.0 ml 55.62 ml/m  RA Volume:   60.70 ml  28.61 ml/m LA Vol (A4C):   109.0 ml 51.38 ml/m LA Biplane Vol: 116.0 ml 54.68 ml/m  AORTIC VALVE                     PULMONIC VALVE AV Area (Vmax):    2.28 cm      PV Vmax:       0.98 m/s AV Area (Vmean):   2.17 cm      PV Peak grad:  3.8 mmHg AV Area (VTI):     2.27 cm AV Vmax:           155.00 cm/s AV Vmean:          106.000 cm/s AV VTI:            0.342 m AV Peak Grad:      9.6 mmHg AV Mean Grad:      5.0 mmHg LVOT Vmax:         102.00 cm/s LVOT Vmean:        66.300 cm/s LVOT VTI:          0.224 m LVOT/AV VTI ratio: 0.65  AORTA Ao Root diam: 2.80 cm Ao Asc diam:  2.90 cm MITRAL VALVE               TRICUSPID VALVE MV Area (PHT): 4.52 cm    TR Peak grad:   41.5 mmHg MV Area VTI:   1.71 cm    TR Vmax:        322.00 cm/s MV Peak grad:  4.1 mmHg MV Mean grad:  1.0 mmHg    SHUNTS MV Vmax:       1.01 m/s    Systemic VTI:  0.22 m MV Vmean:      50.0 cm/s   Systemic Diam: 2.10 cm MV Decel Time: 168 msec MV E velocity: 98.50 cm/s MV A velocity: 60.00 cm/s MV E/A ratio:  1.64 Dorris Carnes MD Electronically signed by Dorris Carnes MD Signature  Date/Time: 02/24/2021/7:42:14 PM    Final     Assessment: 65 year old male with history of CAD s/p stent in 2020 and on Brilinta, ischemic cardiomyopathy s/p ICD in 2021, EF 20-25% in 2021, CKD, HTN, presenting with profound symptomatic anemia with Hgb 4.6. Heme negative.  Iron studies confirmed iron deficiency anemia. BUN chronically elevated in the setting of CKD. He reports 2 episodes of black stool on 10/17 and 10/18  which she thought may have been secondary to chocolate Ensure though no similar symptoms with Ensure previously, otherwise no return of melena and denies hematochezia. Received 4 units PRBCs with improvement in Hgb to 8.2 yesterday, 7.9 this morning. Symptomatically improved.   No prior colonoscopy or EGD; he also denies any family history of colorectal cancer. On meloxicam as well as Brilinta outpatient. No chronic history of concerning upper or lower GI signs/symptoms. He does report 3 days of intermittent LUQ nagging pain, occurring once a day, lasting about 10 minutes and resolving. No associated N/V and unclear if symptoms are related to meals, but improves with passing gas. Abdominal exam is benign. CT on admission with no acute findings. LUQ pain may be secondary to gas, not consistent with pancreatitis, possible PUD/gastritis in the setting of daily meloxicam.    Patient needs EGD and colonoscopy for further evaluation of IDA. EGD will also help evaluate vague LUQ pain. I will also start empiric PPI BID.   Brilinta has been on hold since admission, 10/22. Anesthesia requested updating echocardiogram prior to procedures.  Echocardiogram completed 10/24 with mild improvement in LVEF compared to 2021, now 30-35%.   Plan: Clear liquid diet today.  NPO at midnight.  Continue to hold Brilinta.  Start empiric IV PPI BID due to prior reports of melena. Can be adjusted based on EGD findings.   EGD and colonoscopy with propofol with Dr. Laural Golden tomorrow.The risks, benefits, and  alternatives have been discussed with the patient in detail. The patient states understanding and desires to proceed.  Avoid NSAIDs.  Will need to start oral iron at discharge.      LOS: 0 days    02/25/2021, 9:19 AM   Aliene Altes, PA-C [facility]

## 2021-02-26 ENCOUNTER — Encounter (HOSPITAL_COMMUNITY): Payer: Self-pay | Admitting: Family Medicine

## 2021-02-26 ENCOUNTER — Inpatient Hospital Stay (HOSPITAL_COMMUNITY): Payer: Medicare Other | Admitting: Anesthesiology

## 2021-02-26 ENCOUNTER — Encounter (HOSPITAL_COMMUNITY)
Admission: EM | Disposition: A | Discharge status: Home / Self Care | Payer: Self-pay | Source: Home / Self Care | Attending: Internal Medicine

## 2021-02-26 DIAGNOSIS — K2980 Duodenitis without bleeding: Secondary | ICD-10-CM

## 2021-02-26 DIAGNOSIS — K2970 Gastritis, unspecified, without bleeding: Secondary | ICD-10-CM | POA: Diagnosis not present

## 2021-02-26 DIAGNOSIS — D509 Iron deficiency anemia, unspecified: Secondary | ICD-10-CM | POA: Diagnosis not present

## 2021-02-26 DIAGNOSIS — K5730 Diverticulosis of large intestine without perforation or abscess without bleeding: Secondary | ICD-10-CM | POA: Diagnosis not present

## 2021-02-26 DIAGNOSIS — K921 Melena: Secondary | ICD-10-CM | POA: Diagnosis not present

## 2021-02-26 DIAGNOSIS — K644 Residual hemorrhoidal skin tags: Secondary | ICD-10-CM | POA: Diagnosis not present

## 2021-02-26 DIAGNOSIS — K259 Gastric ulcer, unspecified as acute or chronic, without hemorrhage or perforation: Secondary | ICD-10-CM | POA: Diagnosis not present

## 2021-02-26 HISTORY — PX: BIOPSY: SHX5522

## 2021-02-26 HISTORY — PX: ESOPHAGOGASTRODUODENOSCOPY (EGD) WITH PROPOFOL: SHX5813

## 2021-02-26 HISTORY — PX: COLONOSCOPY WITH PROPOFOL: SHX5780

## 2021-02-26 LAB — CBC
HCT: 27.2 % — ABNORMAL LOW (ref 39.0–52.0)
Hemoglobin: 8.4 g/dL — ABNORMAL LOW (ref 13.0–17.0)
MCH: 28.6 pg (ref 26.0–34.0)
MCHC: 30.9 g/dL (ref 30.0–36.0)
MCV: 92.5 fL (ref 80.0–100.0)
Platelets: 224 10*3/uL (ref 150–400)
RBC: 2.94 MIL/uL — ABNORMAL LOW (ref 4.22–5.81)
RDW: 15.4 % (ref 11.5–15.5)
WBC: 7.2 10*3/uL (ref 4.0–10.5)
nRBC: 0 % (ref 0.0–0.2)

## 2021-02-26 SURGERY — COLONOSCOPY WITH PROPOFOL
Anesthesia: General

## 2021-02-26 MED ORDER — PHENYLEPHRINE 40 MCG/ML (10ML) SYRINGE FOR IV PUSH (FOR BLOOD PRESSURE SUPPORT)
PREFILLED_SYRINGE | INTRAVENOUS | Status: AC
  Filled 2021-02-26: qty 30

## 2021-02-26 MED ORDER — PHENYLEPHRINE 40 MCG/ML (10ML) SYRINGE FOR IV PUSH (FOR BLOOD PRESSURE SUPPORT)
PREFILLED_SYRINGE | INTRAVENOUS | Status: AC
  Filled 2021-02-26: qty 10

## 2021-02-26 MED ORDER — PROPOFOL 500 MG/50ML IV EMUL
INTRAVENOUS | Status: DC | PRN
  Administered 2021-02-26: 150 ug/kg/min via INTRAVENOUS

## 2021-02-26 MED ORDER — LACTATED RINGERS IV SOLN: INTRAVENOUS | Status: DC | PRN

## 2021-02-26 MED ORDER — EPHEDRINE 5 MG/ML INJ
INTRAVENOUS | Status: AC
  Filled 2021-02-26: qty 10

## 2021-02-26 MED ORDER — PROPOFOL 10 MG/ML IV BOLUS
INTRAVENOUS | Status: DC | PRN
  Administered 2021-02-26: 50 mg via INTRAVENOUS

## 2021-02-26 MED ORDER — LIDOCAINE HCL (CARDIAC) PF 100 MG/5ML IV SOSY: PREFILLED_SYRINGE | INTRAVENOUS | Status: DC | PRN

## 2021-02-26 MED ORDER — PANTOPRAZOLE SODIUM 40 MG PO TBEC
40.0000 mg | DELAYED_RELEASE_TABLET | Freq: Two times a day (BID) | ORAL | Status: DC
  Administered 2021-02-26 – 2021-02-27 (×2): 40 mg via ORAL
  Filled 2021-02-26: qty 1

## 2021-02-26 MED ORDER — PHENYLEPHRINE HCL (PRESSORS) 10 MG/ML IV SOLN
INTRAVENOUS | Status: DC | PRN
  Administered 2021-02-26 (×4): 100 ug via INTRAVENOUS

## 2021-02-26 NOTE — Anesthesia Preprocedure Evaluation (Addendum)
Anesthesia Evaluation  Patient identified by MRN, date of birth, ID band Patient awake    Reviewed: Allergy & Precautions, NPO status , Patient's Chart, lab work & pertinent test results, reviewed documented beta blocker date and time   History of Anesthesia Complications Negative for: history of anesthetic complications  Airway Mallampati: II  TM Distance: >3 FB Neck ROM: Full    Dental  (+) Edentulous Upper, Edentulous Lower   Pulmonary shortness of breath and with exertion, former smoker,    Pulmonary exam normal breath sounds clear to auscultation       Cardiovascular Exercise Tolerance: Poor hypertension, Pt. on medications and Pt. on home beta blockers + CAD, + Past MI, + Cardiac Stents and +CHF  + Cardiac Defibrillator  Rhythm:Regular Rate:Abnormal  1. LVEF is depressed with hypokinesis of the base / mid septal wall; akinesis of the distal inferior, distal septal, distal lateral and apical wall Compared to echo from 2021, LVEF is mildly improved. Left ventricular ejection fraction, by estimation, is 30 to 35%. The left ventricle has moderately decreased function. The left ventricular internal cavity size was moderately dilated. Left  ventricular diastolic parameters are consistent with Grade II diastolic dysfunction (pseudonormalization).  2. Right ventricular systolic function is normal. The right ventricular size is normal. There is moderately elevated pulmonary artery systolic pressure.  3. Left atrial size was moderately dilated.  4. Mild mitral valve regurgitation.  5. The aortic valve is normal in structure. Aortic valve regurgitation is  not visualized.    Neuro/Psych negative neurological ROS  negative psych ROS   GI/Hepatic Neg liver ROS, GI Bleed?   Endo/Other  negative endocrine ROS  Renal/GU Renal Insufficiency and CRFRenal disease (AKI on CRI)     Musculoskeletal  (+) Arthritis , Osteoarthritis,     Abdominal   Peds  Hematology  (+) anemia ,   Anesthesia Other Findings   Reproductive/Obstetrics                           Anesthesia Physical Anesthesia Plan  ASA: 4  Anesthesia Plan: General   Post-op Pain Management:    Induction: Intravenous  PONV Risk Score and Plan: TIVA  Airway Management Planned: Nasal Cannula, Natural Airway and Simple Face Mask  Additional Equipment:   Intra-op Plan:   Post-operative Plan: Possible Post-op intubation/ventilation  Informed Consent: I have reviewed the patients History and Physical, chart, labs and discussed the procedure including the risks, benefits and alternatives for the proposed anesthesia with the patient or authorized representative who has indicated his/her understanding and acceptance.       Plan Discussed with: CRNA and Surgeon  Anesthesia Plan Comments:        Anesthesia Quick Evaluation

## 2021-02-26 NOTE — Transfer of Care (Signed)
Immediate Anesthesia Transfer of Care Note  Patient: [name]  Procedure(s) Performed: COLONOSCOPY WITH PROPOFOL ESOPHAGOGASTRODUODENOSCOPY (EGD) WITH PROPOFOL BIOPSY  Patient Location: PACU  Anesthesia Type:General  Level of Consciousness: awake, alert , oriented and patient cooperative  Airway & Oxygen Therapy: Patient Spontanous Breathing  Post-op Assessment: Report given to RN, Post -op Vital signs reviewed and stable and Patient moving all extremities X 4  Post vital signs: Reviewed and stable  Last Vitals:  Vitals Value Taken Time  BP 96/74 02/26/21 1415  Temp 36.3 C 02/26/21 1411  Pulse 56 02/26/21 1415  Resp 10 02/26/21 1415  SpO2 98 % 02/26/21 1415  Vitals shown include unvalidated device data.  Last Pain:  Vitals:   02/26/21 1325  TempSrc:   PainSc: 8       Patients Stated Pain Goal: 5 (65/03/54 6568)  Complications: No notable events documented.

## 2021-02-26 NOTE — Progress Notes (Signed)
Patient requested that his IV 20G in left hand be removed because he it was painful and bothering him with every movement, but refused for another IV to be placed. Advised patient of IV route importance and made physician aware to change IV meds to PO route.

## 2021-02-26 NOTE — Anesthesia Postprocedure Evaluation (Signed)
Anesthesia Post Note  Patient: [name]  Procedure(s) Performed: COLONOSCOPY WITH PROPOFOL ESOPHAGOGASTRODUODENOSCOPY (EGD) WITH PROPOFOL BIOPSY  Patient location during evaluation: PACU Anesthesia Type: General Level of consciousness: awake and alert and oriented Pain management: pain level controlled Vital Signs Assessment: post-procedure vital signs reviewed and stable Respiratory status: spontaneous breathing, nonlabored ventilation and respiratory function stable Cardiovascular status: blood pressure returned to baseline and stable Postop Assessment: no apparent nausea or vomiting Anesthetic complications: no   No notable events documented.   Last Vitals:  Vitals:   02/26/21 1430 02/26/21 1441  BP: 109/85 117/80  Pulse: (!) 57 (!) 54  Resp: (!) 21 18  Temp: 36.9 C (!) 36.3 C  SpO2: 99% 91%    Last Pain:  Vitals:   02/26/21 1441  TempSrc: Oral  PainSc:                  Zykeem Bauserman C Lorilynn Lehr

## 2021-02-26 NOTE — Progress Notes (Signed)
PROGRESS NOTE    [name]  RSW:[id number] DOB: [birth year] DOA: 02/22/2021 PCP: Lindell Spar, MD    Chief Complaint  Patient presents with   Shortness of Breath    Brief Narrative:  As per H&P written by Dr. Nehemiah Settle on 02/22/2021 [name] is a 65 y.o. male with a history of hypertension, coronary artery disease, diabetes, chronic kidney disease, HFrEF with history of ischemic cardiomyopathy and EF of 20 to 25% on echocardiogram 07/2019.  Patient seen for worsening shortness of breath, weakness, fatigue over the past couple of weeks.  Worse with moving around and improved with rest.  He does report darker stools, but no frank melena or blood in stools.  Has not been taking large amounts of NSAIDs and does not drink alcohol.  Diet and appetite have been normal.  No other palliating or provoking factors.  He does complain of abdominal pain that is in his left upper abdomen abdomen, which started earlier today.  Denies diarrhea, constipation.   Emergency Department Course: Hemoglobin 6.  Creatinine 4.18. CT abdomen shows nephrolithiasis without obstruction. Troponins negative. BNP 157  Assessment & Plan: 1-acute on chronic anemia -Patient with chronic anemia in the setting of chronic renal failure (stage IV at baseline) -Presenting with symptomatic anemia and a hemoglobin level of 4.6.  Positive fecal occult blood test. -Case discussed with GI who proceeded with endoscopy and colonoscopy; see below for reports -Patient is currently hemodynamically stable and in no major distress.  Denies overt bleeding. -Status post endoscopy and colonoscopy demonstrating 4 x 8 prepyloric ulcer with pigmented but no bleeding.  Biopsy has been taken from the ulcer margin.  There is also prepyloric gastritis and duodenitis as the most likely cause of patient blood loss.  Colonoscopy with normal terminal ileum and single diverticulum in the hepatic flexure, there is a small external hemorrhoids  without signs of active bleeding.  Otherwise normal colonoscopy evaluation. -Safe to advance diet. -Continue the use of PPI and avoid the use of any NSAIDs.  Okay to resume Brilinta in the next 3 days as per GI recommendation. -Will repeat CBC in AM. -Hopefully discharge home on 02/27/2021 -Hemoglobin improved after 4 units PRBCs; currently 8.4. -IV iron also provided. -Patient will benefit of outpatient Epogen therapy per nephrology discretion.  2-Essential hypertension -Stable overall -Advised to follow low-sodium diet -Continue current antihypertensive agents.  3-chronic systolic heart failure -Continue to follow daily weights -Maintain adequate hydration and follow low-sodium diet -Continue outpatient follow-up with cardiology service -Condition remains stable and compensated currently. -Repeat 2D echo on 02/24/2021 demonstrating ejection fraction of 30 to 35%.  No significant valvulopathy. -Heart healthy/low-sodium diet has been encouraged.  4-Chronic kidney disease, stage IV (severe) ([place]) -Continue outpatient follow-up with nephrology service -Renal function worse at time of admission in the setting of decreased perfusion from severe anemia -Overall tabilized and essentially back to baseline after fluid resuscitation and transfusion. -Continue to follow renal function trend and avoid the use of nephrotoxic agents. -Repeat basic metabolic panel in a.m.  5-CAD in native artery -Patient denies chest pain or shortness of breath. -Given prior history of coronary disease transfusion threshold is for hemoglobin less than 7.5. -Continue the use of Brilinta at discharge.  DVT prophylaxis: SCDs Code Status: Full code Family Communication: No family at bedside; patient expressed that he will update his family on his own. Disposition:   Status is: Observation   Consultants:  Gastroenterology service  Procedures:  None  Antimicrobials: None   Subjective: Patient  expressed feeling hungry. No overt bleeding overnight.  Denies chest pain, no nausea, no vomiting, no shortness of breath.  Objective: Vitals:   02/26/21 1411 02/26/21 1415 02/26/21 1430 02/26/21 1441  BP: 92/68 96/74 109/85 117/80  Pulse: (!) 59 (!) 57 (!) 57 (!) 54  Resp: 13 11 (!) 21 18  Temp: (!) 97.4 F (36.3 C)  98.4 F (36.9 C) (!) 97.3 F (36.3 C)  TempSrc:    Oral  SpO2: 97% 98% 99% 91%  Weight:      Height:        Intake/Output Summary (Last 24 hours) at 02/26/2021 1629 Last data filed at 02/26/2021 1404 Gross per 24 hour  Intake 1000 ml  Output 1500 ml  Net -500 ml   Filed Weights   02/22/21 1009  Weight: 89.8 kg    Examination: General exam: Alert, awake, oriented x 3; in no major distress.  Hemodynamically stable.  Denies chest pain, nausea, vomiting, shortness of breath and abdominal discomfort.  Feeling tired after bowel prep overnight. Respiratory system: Clear to auscultation. Respiratory effort normal.  No requiring oxygen supplementation.  No using accessory muscles. Cardiovascular system:RRR. No rubs or gallops; no JVD. Gastrointestinal system: Abdomen is nondistended, soft and nontender. No organomegaly or masses felt. Normal bowel sounds heard. Central nervous system: Alert and oriented. No focal neurological deficits. Extremities: No cyanosis, clubbing or edema. Skin: No rashes, no petechiae. Psychiatry: Judgement and insight appear normal. Mood & affect appropriate.   Data Reviewed: I have personally reviewed following labs and imaging studies  CBC: Recent Labs  Lab 02/22/21 1100 02/23/21 0213 02/23/21 0411 02/23/21 1026 02/24/21 0344 02/25/21 0805 02/26/21 0925  WBC 7.2  --   --   --  7.1 7.3 7.2  NEUTROABS 5.7  --   --   --   --   --   --   HGB 4.6*   < > 6.5* 7.3* 8.2* 7.9* 8.4*  HCT 15.7*   < > 20.7* 23.3* 26.5* 25.9* 27.2*  MCV 92.9  --   --   --  91.1 92.8 92.5  PLT 335  --   --   --  282 242 224   < > = values in this interval  not displayed.    Basic Metabolic Panel: Recent Labs  Lab 02/22/21 1100 02/24/21 1233 02/25/21 0805  NA 138 136 139  K 4.1 4.1 4.2  CL 111 108 108  CO2 20* 21* 25  GLUCOSE 142* 126* 101*  BUN 37* 36* 37*  CREATININE 4.18* 3.89* 3.56*  CALCIUM 9.0 8.8* 8.7*    GFR: Estimated Creatinine Clearance: 23.3 mL/min (A) (by C-G formula based on SCr of 3.56 mg/dL (H)).  Liver Function Tests: Recent Labs  Lab 02/22/21 1100  AST 23  ALT 11  ALKPHOS 106  BILITOT 0.9  PROT 7.1  ALBUMIN 3.6   CBG: No results for input(s): GLUCAP in the last 168 hours.  Recent Results (from the past 240 hour(s))  Resp Panel by RT-PCR (Flu A&B, Covid) Nasopharyngeal Swab     Status: None   Collection Time: 02/22/21  1:10 PM   Specimen: Nasopharyngeal Swab; Nasopharyngeal(NP) swabs in vial transport medium  Result Value Ref Range Status   SARS Coronavirus 2 by RT PCR NEGATIVE NEGATIVE Final    Comment: (NOTE) SARS-CoV-2 target nucleic acids are NOT DETECTED.  The SARS-CoV-2 RNA is generally detectable in upper respiratory specimens during the acute phase of infection. The lowest concentration of SARS-CoV-2 viral copies  this assay can detect is 138 copies/mL. A negative result does not preclude SARS-Cov-2 infection and should not be used as the sole basis for treatment or other patient management decisions. A negative result may occur with  improper specimen collection/handling, submission of specimen other than nasopharyngeal swab, presence of viral mutation(s) within the areas targeted by this assay, and inadequate number of viral copies(<138 copies/mL). A negative result must be combined with clinical observations, patient history, and epidemiological information. The expected result is Negative.  Fact Sheet for Patients:  EntrepreneurPulse.com.au  Fact Sheet for Healthcare Providers:  IncredibleEmployment.be  This test is no t yet approved or cleared  by the [place] FDA and  has been authorized for detection and/or diagnosis of SARS-CoV-2 by FDA under an Emergency Use Authorization (EUA). This EUA will remain  in effect (meaning this test can be used) for the duration of the COVID-19 declaration under Section 564(b)(1) of the Act, 21 U.S.C.section 360bbb-3(b)(1), unless the authorization is terminated  or revoked sooner.       Influenza A by PCR NEGATIVE NEGATIVE Final   Influenza B by PCR NEGATIVE NEGATIVE Final    Comment: (NOTE) The Xpert Xpress SARS-CoV-2/FLU/RSV plus assay is intended as an aid in the diagnosis of influenza from Nasopharyngeal swab specimens and should not be used as a sole basis for treatment. Nasal washings and aspirates are unacceptable for Xpert Xpress SARS-CoV-2/FLU/RSV testing.  Fact Sheet for Patients: EntrepreneurPulse.com.au  Fact Sheet for Healthcare Providers: IncredibleEmployment.be  This test is not yet approved or cleared by the [place] FDA and has been authorized for detection and/or diagnosis of SARS-CoV-2 by FDA under an Emergency Use Authorization (EUA). This EUA will remain in effect (meaning this test can be used) for the duration of the COVID-19 declaration under Section 564(b)(1) of the Act, 21 U.S.C. section 360bbb-3(b)(1), unless the authorization is terminated or revoked.  Performed at [facility], [address] [place], [place] [address]     Radiology Studies: No results found.  Scheduled Meds:  sodium chloride   Intravenous Once   atorvastatin  80 mg Oral QPM   carvedilol  12.5 mg Oral BID WC   cinacalcet  30 mg Oral Q supper   fluticasone  2 spray Each Nare Daily   furosemide  40 mg Oral Daily   gabapentin  600 mg Oral TID   ivabradine  5 mg Oral BID WC   pantoprazole (PROTONIX) IV  40 mg Intravenous Q12H   potassium citrate  10 mEq Oral Daily   Continuous Infusions:   LOS: 1 day    Time spent: 30  minutes.   Barton Dubois, MD [facility]   To contact the attending provider between 7A-7P or the covering provider during after hours 7P-7A, please log into the web site [email or web address] and access using universal [place] password for that web site. If you do not have the password, please call the hospital operator.  02/26/2021, 4:29 PM

## 2021-02-26 NOTE — Op Note (Signed)
[facility] Patient Name: [name] Procedure Date: 02/26/2021 12:31 PM MRN: [id number] Date of Birth: 17-Jul-1955 Attending MD: Hildred Laser , MD CSN: [id number] Age: 65 Admit Type: Outpatient Procedure:                Upper GI endoscopy Indications:              Melena Providers:                Hildred Laser, MD, Charlsie Quest. Theda Sers RN, RN,                            [place] Page Referring MD:             Lorenda Peck, MD Medicines:                Propofol per Anesthesia Complications:            No immediate complications. Estimated Blood Loss:     Estimated blood loss was minimal. Procedure:                Pre-Anesthesia Assessment:                           - Prior to the procedure, a History and Physical                            was performed, and patient medications and                            allergies were reviewed. The patient's tolerance of                            previous anesthesia was also reviewed. The risks                            and benefits of the procedure and the sedation                            options and risks were discussed with the patient.                            All questions were answered, and informed consent                            was obtained. Prior Anticoagulants: The patient has                            taken no previous anticoagulant or antiplatelet                            agents except for aspirin and has taken no previous                            anticoagulant or antiplatelet agents except for  NSAID medication. ASA Grade Assessment: III - A                            patient with severe systemic disease. After                            reviewing the risks and benefits, the patient was                            deemed in satisfactory condition to undergo the                            procedure.                           After obtaining informed consent, the endoscope was                             passed under direct vision. Throughout the                            procedure, the patient's blood pressure, pulse, and                            oxygen saturations were monitored continuously. The                            [id number]-[id number] ([id number]) scope was introduced through the                            mouth, and advanced to the second part of duodenum.                            The upper GI endoscopy was accomplished without                            difficulty. The patient tolerated the procedure                            well. Scope In: 1:29:34 PM Scope Out: 1:36:12 PM Total Procedure Duration: 0 hours 6 minutes 38 seconds  Findings:      The hypopharynx was normal.      The examined esophagus was normal.      The Z-line was regular and was found 40 cm from the incisors.      One non-bleeding cratered gastric ulcer with pigmented material was       found in the prepyloric region of the stomach. The lesion was four mm by       eight mm in largest dimension. Biopsies were taken with a cold forceps       for histology.      Localized mild inflammation was found in the prepyloric region of the       stomach.      The exam of the stomach was otherwise normal.      Patchy mild inflammation characterized by congestion (edema),  erosions       and erythema was found in the duodenal bulb.      The in the duodenum was normal. Impression:               - Normal hypopharynx.                           - Normal esophagus.                           - Z-line regular, 40 cm from the incisors.                           - Non-bleeding gastric ulcer with pigmented                            material. Biopsied.                           - Gastritis.                           - Duodenitis.                           - Normal. Moderate Sedation:      Per Anesthesia Care Recommendation:           - Resume previous diet today.                           - Continue present  medications.                           - No ibuprofen, naproxen, or other non-steroidal                            anti-inflammatory drugs.                           - Resume Aspirin after 3 days.                           - Await pathology results. Procedure Code(s):        --- Professional ---                           [id number], Esophagogastroduodenoscopy, flexible,                            transoral; with biopsy, single or multiple Diagnosis Code(s):        --- Professional ---                           K25.9, Gastric ulcer, unspecified as acute or                            chronic, without hemorrhage or perforation  K29.70, Gastritis, unspecified, without bleeding                           K29.80, Duodenitis without bleeding                           K92.1, Melena (includes Hematochezia) CPT copyright 2019 American Medical Association. All rights reserved. The codes documented in this report are preliminary and upon coder review may  be revised to meet current compliance requirements. Hildred Laser, MD Hildred Laser, MD 02/26/2021 2:13:45 PM This report has been signed electronically. Number of Addenda: 0

## 2021-02-26 NOTE — Op Note (Signed)
[facility] Patient Name: [name] Procedure Date: 02/26/2021 1:40 PM MRN: [id number] Date of Birth: 01/07/1956 Attending MD: Hildred Laser , MD CSN: [id number] Age: 65 Admit Type: Inpatient Procedure:                Colonoscopy Indications:              Iron deficiency anemia Providers:                Hildred Laser, MD, [place] Page, [place] Theda Sers                            RN, RN Referring MD:             Barton Dubois, MD Medicines:                Propofol per Anesthesia Complications:            No immediate complications. Estimated Blood Loss:     Estimated blood loss: none. Procedure:                Pre-Anesthesia Assessment:                           - Prior to the procedure, a History and Physical                            was performed, and patient medications and                            allergies were reviewed. The patient's tolerance of                            previous anesthesia was also reviewed. The risks                            and benefits of the procedure and the sedation                            options and risks were discussed with the patient.                            All questions were answered, and informed consent                            was obtained. Prior Anticoagulants: The patient has                            taken no previous anticoagulant or antiplatelet                            agents except for aspirin and has taken no previous                            anticoagulant or antiplatelet agents except for  NSAID medication. ASA Grade Assessment: III - A                            patient with severe systemic disease. After                            reviewing the risks and benefits, the patient was                            deemed in satisfactory condition to undergo the                            procedure.                           After obtaining informed consent, the colonoscope                             was passed under direct vision. Throughout the                            procedure, the patient's blood pressure, pulse, and                            oxygen saturations were monitored continuously. The                            [id number]-[id number] ([id number]) scope was introduced through                            the anus and advanced to the the terminal ileum,                            with identification of the appendiceal orifice and                            IC valve. The colonoscopy was performed without                            difficulty. The patient tolerated the procedure                            well. The quality of the bowel preparation was                            good. The terminal ileum, ileocecal valve,                            appendiceal orifice, and rectum were photographed. Scope In: 1:42:44 PM Scope Out: 2:02:49 PM Scope Withdrawal Time: 0 hours 12 minutes 44 seconds  Total Procedure Duration: 0 hours 20 minutes 5 seconds  Findings:      The perianal and digital rectal examinations were normal.      The terminal ileum appeared normal.  A single medium-mouthed diverticulum was found in the hepatic flexure.      The exam was otherwise normal throughout the examined colon.      External hemorrhoids were found during retroflexion. The hemorrhoids       were small. Impression:               - The examined portion of the ileum was normal.                           - Diverticulosis at the hepatic flexure.                           - External hemorrhoids.                           - No specimens collected. Moderate Sedation:      Per Anesthesia Care Recommendation:           - Patient has a contact number available for                            emergencies. The signs and symptoms of potential                            delayed complications were discussed with the                            patient. Return to normal activities tomorrow.                             Written discharge instructions were provided to the                            patient.                           - Resume previous diet today.                           - Continue present medications.                           - Repeat colonoscopy in 10 years for screening                            purposes. Procedure Code(s):        --- Professional ---                           [phone], Colonoscopy, flexible; diagnostic, including                            collection of specimen(s) by brushing or washing,                            when performed (separate procedure) Diagnosis Code(s):        --- Professional ---  K64.4, Residual hemorrhoidal skin tags                           D50.9, Iron deficiency anemia, unspecified                           K57.30, Diverticulosis of large intestine without                            perforation or abscess without bleeding CPT copyright 2019 American Medical Association. All rights reserved. The codes documented in this report are preliminary and upon coder review may  be revised to meet current compliance requirements. Hildred Laser, MD Hildred Laser, MD 02/26/2021 2:17:34 PM This report has been signed electronically. Number of Addenda: 0

## 2021-02-26 NOTE — Progress Notes (Signed)
Subjective:  Patient has no complaints.  He states he is walking the bathroom without any shortness of breath.  He denies chest pain.  He denies abdominal pain.  He is hungry.  Current Medications:  Current Facility-Administered Medications:    [MAR Hold] 0.9 %  sodium chloride infusion (Manually program via Guardrails IV Fluids), , Intravenous, Once, Adefeso, Oladapo, DO   [MAR Hold] atorvastatin (LIPITOR) tablet 80 mg, 80 mg, Oral, QPM, Truett Mainland, DO, 80 mg at 02/25/21 1730   [MAR Hold] carvedilol (COREG) tablet 12.5 mg, 12.5 mg, Oral, BID WC, Stinson, Jacob J, DO, 12.5 mg at 02/25/21 1730   [MAR Hold] cinacalcet (SENSIPAR) tablet 30 mg, 30 mg, Oral, Q supper, Stinson, Jacob J, DO, 30 mg at 02/25/21 1730   [MAR Hold] dicyclomine (BENTYL) capsule 10 mg, 10 mg, Oral, TID PRN, Stinson, Jacob J, DO   [MAR Hold] fluticasone (FLONASE) 50 MCG/ACT nasal spray 2 spray, 2 spray, Each Nare, Daily, Truett Mainland, DO   [MAR Hold] furosemide (LASIX) tablet 40 mg, 40 mg, Oral, Daily, Truett Mainland, DO, 40 mg at 02/26/21 0909   [facility] Hold] gabapentin (NEURONTIN) capsule 600 mg, 600 mg, Oral, TID, Truett Mainland, DO, 600 mg at 02/26/21 0909   [MAR Hold] HYDROcodone-acetaminophen (NORCO) 10-325 MG per tablet 1 tablet, 1 tablet, Oral, BID PRN, Truett Mainland, DO, 1 tablet at 02/24/21 0405   [MAR Hold] ivabradine (CORLANOR) tablet 5 mg, 5 mg, Oral, BID WC, Stinson, Jacob J, DO, 5 mg at 02/26/21 0910   [MAR Hold] ondansetron (ZOFRAN) tablet 4 mg, 4 mg, Oral, Q6H PRN **OR** [MAR Hold] ondansetron (ZOFRAN) injection 4 mg, 4 mg, Intravenous, Q6H PRN, Truett Mainland, DO, 4 mg at 02/23/21 1856   [MAR Hold] pantoprazole (PROTONIX) injection 40 mg, 40 mg, Intravenous, Q12H, Harper, Kristen S, PA-C, 40 mg at 02/26/21 0910   [MAR Hold] potassium citrate (UROCIT-K) SR tablet 10 mEq, 10 mEq, Oral, Daily, Truett Mainland, DO, 10 mEq at 02/26/21 0909   Objective: Blood pressure 130/84, pulse (!) 49,  temperature 98.2 F (36.8 C), temperature source Oral, resp. rate 14, height 6' (1.829 m), weight 89.8 kg, SpO2 98 %. Patient is alert and in no acute distress. Conjunctiva is pink. Sclera is nonicteric Oropharyngeal mucosa is normal. No neck masses or thyromegaly noted. Cardiac exam with regular rhythm normal S1 and S2. No murmur or gallop noted. Lungs are clear to auscultation. Abdomen is symmetrical soft and nontender.  He has localized tender over left costal margin along anterior axillary line.  No organomegaly or masses. No LE edema or clubbing noted.  Labs/studies Results:   CBC Latest Ref Rng & Units 02/26/2021 02/25/2021 02/24/2021  WBC 4.0 - 10.5 K/uL 7.2 7.3 7.1  Hemoglobin 13.0 - 17.0 g/dL 8.4(L) 7.9(L) 8.2(L)  Hematocrit 39.0 - 52.0 % 27.2(L) 25.9(L) 26.5(L)  Platelets 150 - 400 K/uL 224 242 282    CMP Latest Ref Rng & Units 02/25/2021 02/24/2021 02/22/2021  Glucose 70 - 99 mg/dL 101(H) 126(H) 142(H)  BUN 8 - 23 mg/dL 37(H) 36(H) 37(H)  Creatinine 0.61 - 1.24 mg/dL 3.56(H) 3.89(H) 4.18(H)  Sodium 135 - 145 mmol/L 139 136 138  Potassium 3.5 - 5.1 mmol/L 4.2 4.1 4.1  Chloride 98 - 111 mmol/L 108 108 111  CO2 22 - 32 mmol/L 25 21(L) 20(L)  Calcium 8.9 - 10.3 mg/dL 8.7(L) 8.8(L) 9.0  Total Protein 6.5 - 8.1 g/dL - - 7.1  Total Bilirubin 0.3 - 1.2 mg/dL - -  0.9  Alkaline Phos 38 - 126 U/L - - 106  AST 15 - 41 U/L - - 23  ALT 0 - 44 U/L - - 11    Hepatic Function Latest Ref Rng & Units 02/22/2021 08/28/2020 02/15/2020  Total Protein 6.5 - 8.1 g/dL 7.1 7.1 6.8  Albumin 3.5 - 5.0 g/dL 3.6 4.2 3.6  AST 15 - 41 U/L 23 9 13(L)  ALT 0 - 44 U/L '11 7 12  ' Alk Phosphatase 38 - 126 U/L 106 127(H) 78  Total Bilirubin 0.3 - 1.2 mg/dL 0.9 0.8 1.4(H)     Assessment:  #1.  Iron deficiency anemia.  Patient has received 4 units of PRBCs and his hemoglobin is up from 4.6g to 8.2 g.  He feels much better.  He has a history of melena.  His stool was guaiac negative at admission.  He is  on ticagrelor, low-dose aspirin as well as meloxicam.  He therefore could have peptic ulcer disease.  He could also have NSAID induced injury to lower GI tract.  Colonic neoplasm remains in differential diagnosis.  #2.  Ischemic cardiomyopathy.  Patient remains in compensated stage.  He underwent echo yesterday revealing EF of 30 to 35% better than on his last study.  #3.  Chronic kidney disease.  Plan:  Proceed with diagnostic esophagogastroduodenoscopy and colonoscopy under monitored anesthesia care.

## 2021-02-26 NOTE — Progress Notes (Signed)
Brief EGD and colonoscopy notes  EGD  4 x 8 deep prepyloric ulcer with pigmented but no bleeding.  Biopsy taken from ulcer margin. Prepyloric gastritis and duodenitis.   Colonoscopy Normal terminal ileum Single diverticulum hepatic flexure and small external hemorrhoids otherwise normal colonoscopy.

## 2021-02-27 ENCOUNTER — Ambulatory Visit: Payer: Medicare Other | Admitting: Internal Medicine

## 2021-02-27 ENCOUNTER — Encounter (HOSPITAL_COMMUNITY): Payer: Self-pay | Admitting: Internal Medicine

## 2021-02-27 ENCOUNTER — Ambulatory Visit: Payer: Medicare Other | Admitting: Family Medicine

## 2021-02-27 DIAGNOSIS — E1121 Type 2 diabetes mellitus with diabetic nephropathy: Secondary | ICD-10-CM

## 2021-02-27 LAB — BASIC METABOLIC PANEL
Anion gap: 8 (ref 5–15)
BUN: 31 mg/dL — ABNORMAL HIGH (ref 8–23)
CO2: 23 mmol/L (ref 22–32)
Calcium: 8.9 mg/dL (ref 8.9–10.3)
Chloride: 104 mmol/L (ref 98–111)
Creatinine, Ser: 3.31 mg/dL — ABNORMAL HIGH (ref 0.61–1.24)
GFR, Estimated: 20 mL/min — ABNORMAL LOW (ref >60)
Glucose, Bld: 99 mg/dL (ref 70–99)
Potassium: 4.5 mmol/L (ref 3.5–5.1)
Sodium: 135 mmol/L (ref 135–145)

## 2021-02-27 LAB — CBC
HCT: 25.4 % — ABNORMAL LOW (ref 39.0–52.0)
Hemoglobin: 8 g/dL — ABNORMAL LOW (ref 13.0–17.0)
MCH: 29 pg (ref 26.0–34.0)
MCHC: 31.5 g/dL (ref 30.0–36.0)
MCV: 92 fL (ref 80.0–100.0)
Platelets: 226 10*3/uL (ref 150–400)
RBC: 2.76 MIL/uL — ABNORMAL LOW (ref 4.22–5.81)
RDW: 15.8 % — ABNORMAL HIGH (ref 11.5–15.5)
WBC: 6.9 10*3/uL (ref 4.0–10.5)
nRBC: 0 % (ref 0.0–0.2)

## 2021-02-27 MED ORDER — INFLUENZA VAC A&B SA ADJ QUAD 0.5 ML IM PRSY: 0.5000 mL | PREFILLED_SYRINGE | INTRAMUSCULAR | Status: DC

## 2021-02-27 MED ORDER — PANTOPRAZOLE SODIUM 40 MG PO TBEC
40.0000 mg | DELAYED_RELEASE_TABLET | Freq: Two times a day (BID) | ORAL | 2 refills | Status: DC
Start: 2021-02-27 — End: 2021-03-05

## 2021-02-27 MED ORDER — INFLUENZA VAC A&B SA ADJ QUAD 0.5 ML IM PRSY
0.5000 mL | PREFILLED_SYRINGE | INTRAMUSCULAR | Status: AC
  Administered 2021-02-27: 0.5 mL via INTRAMUSCULAR
  Filled 2021-02-27: qty 0.5

## 2021-02-27 MED ORDER — ASPIRIN 81 MG PO CHEW
81.0000 mg | CHEWABLE_TABLET | Freq: Every day | ORAL | Status: DC
Start: 2021-03-04 — End: 2022-02-06

## 2021-02-27 NOTE — Discharge Summary (Signed)
Physician Discharge Summary  [name]:[id number] DOB: 09-13-1955 DOA: 02/22/2021  PCP: Lindell Spar, MD  Admit date: 02/22/2021 Discharge date: 02/27/2021  Time spent: 35 minutes  Recommendations for Outpatient Follow-up:  Repeat CBC to follow hemoglobin trend and instability Repeat basic metabolic panel to follow electrolytes and renal function.  Discharge Diagnoses:  Principal Problem:   Symptomatic anemia Active Problems:   Essential hypertension   Chronic kidney disease, stage IV (severe) (HCC)   Congestive heart failure (HCC)   Ischemic cardiomyopathy   CAD in native artery   Prediabetes   LUQ pain   Acute anemia   Type 2 diabetes with nephropathy ([place])   Discharge Condition: Stable and improved.  Discharged home with instruction to follow-up with PCP, gastroenterology and cardiology service as an outpatient.  CODE STATUS: Full code.  Diet recommendation: Heart healthy/low-sodium diet.  Filed Weights   02/22/21 1009  Weight: 89.8 kg    History of present illness:  As per H&P written by Dr. Nehemiah Settle on 02/22/2021 [name] is a 65 y.o. male with a history of hypertension, coronary artery disease, diabetes, chronic kidney disease, HFrEF with history of ischemic cardiomyopathy and EF of 20 to 25% on echocardiogram 07/2019.  Patient seen for worsening shortness of breath, weakness, fatigue over the past couple of weeks.  Worse with moving around and improved with rest.  He does report darker stools, but no frank melena or blood in stools.  Has not been taking large amounts of NSAIDs and does not drink alcohol.  Diet and appetite have been normal.  No other palliating or provoking factors.  He does complain of abdominal pain that is in his left upper abdomen abdomen, which started earlier today.  Denies diarrhea, constipation.   Emergency Department Course: Hemoglobin 6.  Creatinine 4.18. CT abdomen shows nephrolithiasis without obstruction. Troponins  negative. BNP 157  Hospital Course:  1-acute on chronic anemia -Patient with chronic anemia in the setting of chronic renal failure (stage IV at baseline) -Presenting with symptomatic anemia and a hemoglobin level of 4.6.  Positive fecal occult blood test. -Case discussed with GI who proceeded with endoscopy and colonoscopy. -Patient is currently hemodynamically stable and in no major distress.  Denies overt bleeding. -Status post endoscopy and colonoscopy demonstrating 4 x 8 prepyloric ulcer with pigmented but no bleeding.  Biopsy has been taken from the ulcer margin.  There is also prepyloric gastritis and duodenitis as the most likely cause of patient blood loss.  Colonoscopy with normal terminal ileum and single diverticulum in the hepatic flexure, there is a small external hemorrhoids without signs of active bleeding.  Otherwise normal colonoscopy evaluation. -In advance and well-tolerated at discharge. -Continue the use of PPI twice a day and avoid the use of any NSAIDs.  Okay to resume Brilinta at discharge and to resume baby aspirin in 4 > days, following GI service recommendations. -Hemoglobin improved after 4 units PRBCs and IV iron; currently 8-8.4 at discharge.  No overt bleeding. -Patient will benefit of outpatient Epogen and IV iron therapy per nephrology discretion.   2-Essential hypertension -Stable and well-controlled overall -Advised to follow heart healthy/low-sodium diet -Continue current antihypertensive agents.   3-chronic systolic heart failure -Continue outpatient follow-up with cardiology service -Condition remains stable and compensated currently. -Repeat 2D echo on 02/24/2021 demonstrating ejection fraction of 30 to 35%.  No significant valvulopathy. -Heart healthy/low-sodium diet has been encouraged. -Patient instructed to continue checking his weight on daily basis and to resume the use of home  cardiogenic regimen.   4-acute on chronic kidney disease, stage IV  (severe) ([place]) -Continue outpatient follow-up with nephrology service -Renal function worse at time of admission in the setting of decreased perfusion from severe anemia -Overall tabilized and essentially back to his baseline after fluid resuscitation and transfusion. -Patient advised to minimize the use of nephrotoxic agents and to avoid the use of NSAIDs. -Repeat basic metabolic panel at follow-up visit to assess renal function and stability.   5-CAD in native artery -Patient denies chest pain or shortness of breath. -Given prior history of coronary disease transfusion threshold is for hemoglobin less than 7.5. -Continue the use of Brilinta at discharge. -Per GI service okay to resume the use of baby aspirin in 4 days.  Procedures: See below for x-ray reports. EGD/colonoscopy 02/26/2021: Demonstrating followed by a prepyloric ulcer with pigmented blood but no active bleeding; biopsies has been taking from the ulcer margin.  There was also prepyloric gastritis and duodenitis.  Colonoscopy evaluation with external hemorrhoids and a single diverticulum in the hepatic flexure; Normal otherwise colonoscopy evaluation.   Consultations: Gastroenterology service   Discharge Exam: Vitals:   02/26/21 2312 02/27/21 0500  BP: 129/82 113/77  Pulse: (!) 58 64  Resp: 19 19  Temp: 98 F (36.7 C) 97.9 F (36.6 C)  SpO2: 100% 100%    General: Afebrile, oriented x3; in no major distress.  Hemodynamically stable, reporting no abdominal pain and expressing no overt bleeding.  Patient feels ready to go home. Cardiovascular: S1 and S2, no rubs, no gallops, no JVD on exam. Respiratory: Good air movement bilaterally; no using accessory muscles.  Good saturation on room air. Abdomen: Soft, nontender, no guarding, positive bowel sounds. Extremities: No cyanosis, no clubbing, no edema.  Discharge Instructions   Discharge Instructions     (HEART FAILURE PATIENTS) Call MD:  Anytime you have any of  the following symptoms: 1) 3 pound weight gain in 24 hours or 5 pounds in 1 week 2) shortness of breath, with or without a dry hacking cough 3) swelling in the hands, feet or stomach 4) if you have to sleep on extra pillows at night in order to breathe.   Complete by: As directed    Diet - low sodium heart healthy   Complete by: As directed    Diet Carb Modified   Complete by: As directed    Discharge instructions   Complete by: As directed    Take medications as prescribed Maintain adequate hydration Follow low-sodium/heart healthy diet and watch amount of carbohydrates intake. Check weight on daily basis Arrange follow-up with PCP in 10 days Follow-up with gastroenterology as instructed (office will contact you with appointment details). Avoid the use of NSAIDs (ibuprofen, Motrin, Goody powders, meloxicam, Aleve, Excedrin, etc.).      Allergies as of 02/27/2021   No Known Allergies      Medication List     STOP taking these medications    gabapentin 600 MG tablet Commonly known as: Neurontin   HYDROcodone-acetaminophen 10-325 MG tablet Commonly known as: NORCO   meloxicam 15 MG tablet Commonly known as: MOBIC   sodium bicarbonate 650 MG tablet       TAKE these medications    allopurinol 100 MG tablet Commonly known as: ZYLOPRIM Take 100 mg by mouth daily. What changed: Another medication with the same name was removed. Continue taking this medication, and follow the directions you see here.   aspirin 81 MG chewable tablet Chew 1 tablet (81 mg total)  by mouth daily. Start taking on: March 04, 2021 What changed: These instructions start on March 04, 2021. If you are unsure what to do until then, ask your doctor or other care provider.   atorvastatin 80 MG tablet Commonly known as: LIPITOR TAKE 1 TABLET BY MOUTH DAILY AT 6PM What changed: See the new instructions.   B-D SINGLE USE SWABS REGULAR Pads USE AS DIRECTED   blood glucose meter kit and  supplies Dispense based on patient and insurance preference. Use up to four times daily as directed. (FOR ICD-10 E10.9, E11.9).   Brilinta 60 MG Tabs tablet Generic drug: ticagrelor Take 1 tablet by mouth twice daily   carvedilol 12.5 MG tablet Commonly known as: COREG TAKE 1 TABLET BY MOUTH TWICE DAILY WITH MEALS   cinacalcet 30 MG tablet Commonly known as: SENSIPAR Take 30 mg by mouth daily.   colchicine 0.6 MG tablet Take 0.6 mg by mouth daily.   fluticasone 50 MCG/ACT nasal spray Commonly known as: FLONASE Place 2 sprays into both nostrils daily.   furosemide 40 MG tablet Commonly known as: LASIX Take 1 tablet (40 mg total) by mouth daily.   ivabradine 5 MG Tabs tablet Commonly known as: Corlanor Take 1 tablet (5 mg total) by mouth 2 (two) times daily with a meal.   nitroGLYCERIN 0.4 MG SL tablet Commonly known as: Nitrostat Place 1 tablet (0.4 mg total) under the tongue every 5 (five) minutes as needed.   oxyCODONE-acetaminophen 10-325 MG tablet Commonly known as: PERCOCET Take 1 tablet by mouth every 6 (six) hours as needed for pain.   pantoprazole 40 MG tablet Commonly known as: PROTONIX Take 1 tablet (40 mg total) by mouth 2 (two) times daily.   potassium citrate 10 MEQ (1080 MG) SR tablet Commonly known as: UROCIT-K Take 1 tablet by mouth daily.   promethazine-dextromethorphan 6.25-15 MG/5ML syrup Commonly known as: PROMETHAZINE-DM Take 5 mLs by mouth 4 (four) times daily as needed for cough.   TRUEplus Lancets 30G Misc Use as directed to check blood sugar daily.       No Known Allergies  Follow-up Information     Lindell Spar, MD. Schedule an appointment as soon as possible for a visit in 10 day(s).   Specialty: Internal Medicine Contact information: [address] [place] [address] [phone]         Belva Crome, MD .   Specialty: Cardiology Contact information: [phone] [address] [place]  [address] [phone]                 The results of significant diagnostics from this hospitalization (including imaging, microbiology, ancillary and laboratory) are listed below for reference.    Significant Diagnostic Studies: CT ABDOMEN PELVIS WO CONTRAST  Result Date: 02/22/2021 CLINICAL DATA:  Abdominal pain EXAM: CT ABDOMEN AND PELVIS WITHOUT CONTRAST TECHNIQUE: Multidetector CT imaging of the abdomen and pelvis was performed following the standard protocol without IV contrast. COMPARISON:  07/11/2019 FINDINGS: Lower chest: Coronary stent. Transvenous lead extends towards the RV apex. No pleural or pericardial effusion. Visualized lung bases clear. Hepatobiliary: No focal liver abnormality is seen. Status post cholecystectomy. No biliary dilatation. Pancreas: Unremarkable. No pancreatic ductal dilatation or surrounding inflammatory changes. Spleen: Normal in size without focal abnormality. Adrenals/Urinary Tract: Adrenal glands unremarkable. Bilateral urolithiasis, largest stone 1.7 cm right lower pole collecting system. There is a 1 cm mid left calculus. No hydronephrosis. Stable 5.1 cm left lower pole probable cyst. Urinary bladder  incompletely distended. Stomach/Bowel: Stomach is partially distended, unremarkable. The small bowel is nondilated. Normal appendix. The colon is nondilated, unremarkable. Vascular/Lymphatic: Moderate calcified aortoiliac plaque without aneurysm. No abdominal or pelvic adenopathy. Reproductive: Prostate enlargement with central calcifications. Other: No ascites.  Right pelvic phleboliths.  No free air. Musculoskeletal: Exuberant anterior endplate spurring in the thoracolumbar spine. No fracture or worrisome bone lesion. IMPRESSION: 1. No acute findings. 2. Nonobstructive bilateral urolithiasis. 3.  Aortic Atherosclerosis (ICD10-170.0). Electronically Signed   By: Lucrezia Europe M.D.   On: 02/22/2021 14:41   DG Chest Portable 1 View  Result Date:  02/22/2021 CLINICAL DATA:  Shortness of breath.  Lethargy and abdominal pain. EXAM: PORTABLE CHEST 1 VIEW COMPARISON:  09/27/2019 FINDINGS: Patient has LEFT-sided AICD with leads to the RIGHT ventricle. The heart is mildly enlarged and stable in configuration. There are no focal consolidations or pleural effusions. No edema. IMPRESSION: Stable cardiomegaly. Electronically Signed   By: Nolon Nations M.D.   On: 02/22/2021 11:37   ECHOCARDIOGRAM COMPLETE  Result Date: 02/24/2021    ECHOCARDIOGRAM REPORT   Patient Name:   [name] Date of Exam: 02/24/2021 Medical Rec #:  [id number]        Height:       72.0 in Accession #:    [id number]       Weight:       198.0 lb Date of Birth:  July 12, 1955        BSA:          2.121 m Patient Age:    38 years         BP:           115/87 mmHg Patient Gender: M                HR:           61 bpm. Exam Location:  [facility] Procedure: 2D Echo, Cardiac Doppler and Color Doppler Indications:    Cardiomyopathy-Ischemic, CHF  History:        Patient has prior history of Echocardiogram examinations, most                 recent 07/05/2019. CHF, Previous Myocardial Infarction and CAD,                 Defibrillator, Signs/Symptoms:Shortness of Breath; Risk                 Factors:Hypertension, Dyslipidemia and Former Smoker.  Sonographer:    Wenda Low Referring Phys: Alcan Border  1. LVEF is depressed with hypokinesis of the base / mid septal wall; akinesis of the distal inferior, distal septal, distal lateral and apical wall Compared to echo from 2021, LVEF is mildly improved. . Left ventricular ejection fraction, by estimation,  is 30 to 35%. The left ventricle has moderately decreased function. The left ventricular internal cavity size was moderately dilated. Left ventricular diastolic parameters are consistent with Grade II diastolic dysfunction (pseudonormalization).  2. Right ventricular systolic function is normal. The right ventricular size is  normal. There is moderately elevated pulmonary artery systolic pressure.  3. Left atrial size was moderately dilated.  4. Mild mitral valve regurgitation.  5. The aortic valve is normal in structure. Aortic valve regurgitation is not visualized. FINDINGS  Left Ventricle: LVEF is depressed with hypokinesis of the base / mid septal wall; akinesis of the distal inferior, distal septal, distal lateral and apical wall Compared to echo from 2021, LVEF is mildly improved.  Left ventricular ejection fraction, by estimation, is 30 to 35%. The left ventricle has moderately decreased function. The left ventricular internal cavity size was moderately dilated. There is no left ventricular hypertrophy. Left ventricular diastolic parameters are consistent with Grade II  diastolic dysfunction (pseudonormalization). Right Ventricle: The right ventricular size is normal. Right vetricular wall thickness was not assessed. Right ventricular systolic function is normal. There is moderately elevated pulmonary artery systolic pressure. The tricuspid regurgitant velocity is  3.22 m/s, and with an assumed right atrial pressure of 8 mmHg, the estimated right ventricular systolic pressure is 63.3 mmHg. Left Atrium: Left atrial size was moderately dilated. Right Atrium: Right atrial size was normal in size. Pericardium: There is no evidence of pericardial effusion. Mitral Valve: There is mild thickening of the mitral valve leaflet(s). Mild mitral valve regurgitation. MV peak gradient, 4.1 mmHg. The mean mitral valve gradient is 1.0 mmHg. Tricuspid Valve: The tricuspid valve is normal in structure. Tricuspid valve regurgitation is mild. Aortic Valve: The aortic valve is normal in structure. Aortic valve regurgitation is not visualized. Aortic valve mean gradient measures 5.0 mmHg. Aortic valve peak gradient measures 9.6 mmHg. Aortic valve area, by VTI measures 2.27 cm. Pulmonic Valve: The pulmonic valve was normal in structure. Pulmonic valve  regurgitation is not visualized. Aorta: The aortic root is normal in size and structure. IAS/Shunts: No atrial level shunt detected by color flow Doppler. Additional Comments: A device lead is visualized.  LEFT VENTRICLE PLAX 2D LVIDd:         5.80 cm      Diastology LVIDs:         4.90 cm      LV e' medial:    5.33 cm/s LV PW:         1.10 cm      LV E/e' medial:  18.5 LV IVS:        1.00 cm      LV e' lateral:   5.11 cm/s LVOT diam:     2.10 cm      LV E/e' lateral: 19.3 LV SV:         78 LV SV Index:   37 LVOT Area:     3.46 cm  LV Volumes (MOD) LV vol d, MOD A2C: 178.0 ml LV vol d, MOD A4C: 184.0 ml LV vol s, MOD A2C: 123.0 ml LV vol s, MOD A4C: 128.0 ml LV SV MOD A2C:     55.0 ml LV SV MOD A4C:     184.0 ml LV SV MOD BP:      56.2 ml RIGHT VENTRICLE RV Basal diam:  3.75 cm RV Mid diam:    3.40 cm LEFT ATRIUM              Index        RIGHT ATRIUM           Index LA diam:        4.20 cm  1.98 cm/m   RA Area:     20.40 cm LA Vol (A2C):   118.0 ml 55.62 ml/m  RA Volume:   60.70 ml  28.61 ml/m LA Vol (A4C):   109.0 ml 51.38 ml/m LA Biplane Vol: 116.0 ml 54.68 ml/m  AORTIC VALVE                     PULMONIC VALVE AV Area (Vmax):    2.28 cm      PV Vmax:  0.98 m/s AV Area (Vmean):   2.17 cm      PV Peak grad:  3.8 mmHg AV Area (VTI):     2.27 cm AV Vmax:           155.00 cm/s AV Vmean:          106.000 cm/s AV VTI:            0.342 m AV Peak Grad:      9.6 mmHg AV Mean Grad:      5.0 mmHg LVOT Vmax:         102.00 cm/s LVOT Vmean:        66.300 cm/s LVOT VTI:          0.224 m LVOT/AV VTI ratio: 0.65  AORTA Ao Root diam: 2.80 cm Ao Asc diam:  2.90 cm MITRAL VALVE               TRICUSPID VALVE MV Area (PHT): 4.52 cm    TR Peak grad:   41.5 mmHg MV Area VTI:   1.71 cm    TR Vmax:        322.00 cm/s MV Peak grad:  4.1 mmHg MV Mean grad:  1.0 mmHg    SHUNTS MV Vmax:       1.01 m/s    Systemic VTI:  0.22 m MV Vmean:      50.0 cm/s   Systemic Diam: 2.10 cm MV Decel Time: 168 msec MV E velocity: 98.50 cm/s  MV A velocity: 60.00 cm/s MV E/A ratio:  1.64 Dorris Carnes MD Electronically signed by Dorris Carnes MD Signature Date/Time: 02/24/2021/7:42:14 PM    Final    CUP PACEART REMOTE DEVICE CHECK  Result Date: 02/24/2021 Scheduled remote reviewed. Normal device function.  HL=28, followed by HF Next remote 91 days. LR  CUP PACEART REMOTE DEVICE CHECK  Result Date: 02/24/2021 Scheduled remote reviewed. Normal device function.  Heart logic elevated at 28, sent to triage. Next remote 91 days. Kathy Breach, RN, CCDS, [employer]   Microbiology: Recent Results (from the past 240 hour(s))  Resp Panel by RT-PCR (Flu A&B, Covid) Nasopharyngeal Swab     Status: None   Collection Time: 02/22/21  1:10 PM   Specimen: Nasopharyngeal Swab; Nasopharyngeal(NP) swabs in vial transport medium  Result Value Ref Range Status   SARS Coronavirus 2 by RT PCR NEGATIVE NEGATIVE Final    Comment: (NOTE) SARS-CoV-2 target nucleic acids are NOT DETECTED.  The SARS-CoV-2 RNA is generally detectable in upper respiratory specimens during the acute phase of infection. The lowest concentration of SARS-CoV-2 viral copies this assay can detect is 138 copies/mL. A negative result does not preclude SARS-Cov-2 infection and should not be used as the sole basis for treatment or other patient management decisions. A negative result may occur with  improper specimen collection/handling, submission of specimen other than nasopharyngeal swab, presence of viral mutation(s) within the areas targeted by this assay, and inadequate number of viral copies(<138 copies/mL). A negative result must be combined with clinical observations, patient history, and epidemiological information. The expected result is Negative.  Fact Sheet for Patients:  EntrepreneurPulse.com.au  Fact Sheet for Healthcare Providers:  IncredibleEmployment.be  This test is no t yet approved or cleared by the [place]  FDA and  has been authorized for detection and/or diagnosis of SARS-CoV-2 by FDA under an Emergency Use Authorization (EUA). This EUA will remain  in effect (meaning this test can be used) for the duration of  the COVID-19 declaration under Section 564(b)(1) of the Act, 21 U.S.C.section 360bbb-3(b)(1), unless the authorization is terminated  or revoked sooner.       Influenza A by PCR NEGATIVE NEGATIVE Final   Influenza B by PCR NEGATIVE NEGATIVE Final    Comment: (NOTE) The Xpert Xpress SARS-CoV-2/FLU/RSV plus assay is intended as an aid in the diagnosis of influenza from Nasopharyngeal swab specimens and should not be used as a sole basis for treatment. Nasal washings and aspirates are unacceptable for Xpert Xpress SARS-CoV-2/FLU/RSV testing.  Fact Sheet for Patients: EntrepreneurPulse.com.au  Fact Sheet for Healthcare Providers: IncredibleEmployment.be  This test is not yet approved or cleared by the [place] FDA and has been authorized for detection and/or diagnosis of SARS-CoV-2 by FDA under an Emergency Use Authorization (EUA). This EUA will remain in effect (meaning this test can be used) for the duration of the COVID-19 declaration under Section 564(b)(1) of the Act, 21 U.S.C. section 360bbb-3(b)(1), unless the authorization is terminated or revoked.  Performed at [facility], [address] [place], [place] [address]      Labs: Basic Metabolic Panel: Recent Labs  Lab 02/22/21 1100 02/24/21 1233 02/25/21 0805 02/27/21 0500  NA 138 136 139 135  K 4.1 4.1 4.2 4.5  CL 111 108 108 104  CO2 20* 21* 25 23  GLUCOSE 142* 126* 101* 99  BUN 37* 36* 37* 31*  CREATININE 4.18* 3.89* 3.56* 3.31*  CALCIUM 9.0 8.8* 8.7* 8.9   Liver Function Tests: Recent Labs  Lab 02/22/21 1100  AST 23  ALT 11  ALKPHOS 106  BILITOT 0.9  PROT 7.1  ALBUMIN 3.6   Recent Labs  Lab 02/22/21 1100  LIPASE 45   CBC: Recent Labs  Lab  02/22/21 1100 02/23/21 0213 02/23/21 1026 02/24/21 0344 02/25/21 0805 02/26/21 0925 02/27/21 0500  WBC 7.2  --   --  7.1 7.3 7.2 6.9  NEUTROABS 5.7  --   --   --   --   --   --   HGB 4.6*   < > 7.3* 8.2* 7.9* 8.4* 8.0*  HCT 15.7*   < > 23.3* 26.5* 25.9* 27.2* 25.4*  MCV 92.9  --   --  91.1 92.8 92.5 92.0  PLT 335  --   --  282 242 224 226   < > = values in this interval not displayed.    BNP (last 3 results) Recent Labs    02/22/21 1100  BNP 157.0*    Signed:  Barton Dubois MD.  [facility] 02/27/2021, 2:04 PM

## 2021-02-28 ENCOUNTER — Other Ambulatory Visit: Payer: Self-pay | Admitting: Family Medicine

## 2021-02-28 ENCOUNTER — Telehealth: Payer: Self-pay

## 2021-02-28 ENCOUNTER — Other Ambulatory Visit: Payer: Self-pay

## 2021-02-28 ENCOUNTER — Other Ambulatory Visit (HOSPITAL_COMMUNITY): Payer: Self-pay | Admitting: Internal Medicine

## 2021-02-28 ENCOUNTER — Telehealth: Payer: Self-pay | Admitting: *Deleted

## 2021-02-28 DIAGNOSIS — I10 Essential (primary) hypertension: Secondary | ICD-10-CM

## 2021-02-28 DIAGNOSIS — I5022 Chronic systolic (congestive) heart failure: Secondary | ICD-10-CM

## 2021-02-28 LAB — SURGICAL PATHOLOGY

## 2021-02-28 NOTE — Chronic Care Management (AMB) (Signed)
  Chronic Care Management   Note  02/28/2021 Name: [name] MRN: [id number] DOB: May 01, 1956  [name] is a 65 y.o. year old male who is a primary care patient of Lindell Spar, MD. I reached out to [name] by phone today in response to a referral sent by [name] PCP.  [name] was given information about Chronic Care Management services today including:  CCM service includes personalized support from designated clinical staff supervised by his physician, including individualized plan of care and coordination with other care providers 24/7 contact phone numbers for assistance for urgent and routine care needs. Service will only be billed when office clinical staff spend 20 minutes or more in a month to coordinate care. Only one practitioner may furnish and bill the service in a calendar month. The patient may stop CCM services at any time (effective at the end of the month) by phone call to the office staff. The patient is responsible for co-pay (up to 20% after annual deductible is met) if co-pay is required by the individual health plan.   Patient agreed to services and verbal consent obtained.   Follow up plan: Telephone appointment with care management team member scheduled for:04/01/21  [facility]  Direct Dial: [phone]

## 2021-02-28 NOTE — Progress Notes (Signed)
Remote ICD transmission.   

## 2021-02-28 NOTE — Telephone Encounter (Signed)
Transition Care Management Unsuccessful Follow-up Telephone Call  Date of discharge and from where:  02/27/21 [facility]  Diagnosis: SYMPTOMATIC ANEMIA  Attempts:  1st Attempt  Reason for unsuccessful TCM follow-up call:  Left voice message

## 2021-03-02 NOTE — Progress Notes (Signed)
ADVANCED HF CLINIC  NOTE    Patient did not show for appt. Note left for templating purposes only.      Primary Cardiologist: Dr. Burt Knack Primary Care: Dr. Orson Ape AHF: Dr. Haroldine Laws   HPI:  [name] is a 65 y/o male, former smoker with HTN, CKD IV, CAD s/p large anterolateral STEMI 4/17 and systolic HF. Has [employer] ICD.   Cath 03/12/2019 Total occlusion of the mid LAD treated with angioplasty and stenting using a 4.0 x 22 Onyx deployed at 14 atm.  0% stenosis was noted post procedure and TIMI grade III flow increased from 0 to 3.  The final angiographic images of the LAD demonstrates apical occlusion due to embolus. Luminal irregularities in the left main but no agreeable stenosis. Large circumflex with 3 obtuse marginals.  The first marginal is large and has a very distal 90% stenosis in 2 branches.  There is a trifurcation distally in the first marginal. RCA Moderate diffuse luminal irregularities but widely patent. Mid anterior wall to apical akinesis.  EF 30 to 35% acutely.  LVEDP 31 mmHg.   Post cath had low BP and shock but recovered. EF 20% by echo. No significant MR. Creatinine post cath 3.1-3.4. Now followed by nephrology.  Echo 3/21. EF 20-25%. RV normal.  S/P [employer] ICD implant 08/2019   Admitted 5/21 with large left renal stone. S/P left nephrolithotomy.  Admitted to [facility] earlier this month with hgb 4.6 and SCr up to 4.2.Transfused 4u RBCs. Underwent EGD/colonoscopy 58m x 8 mm prepyloric ulcer wuth prepyloric gastritis and duodenitis. Colonoscopy with single diverticulum in the hepatic flexure and small external hemorrhoids. Started on epogen. Scr 3.3 on d/c on 02/27/21  Bidil stopped due to low BP  Echo 10/22 EF 30-35%   He presents for post-hospital f/u.  Review of systems complete and found to be negative unless listed in HPI.     Past Medical History:  Diagnosis Date   Acute ST elevation myocardial infarction (STEMI) due to  occlusion of mid portion of left anterior descending (LAD) coronary artery [place] 140/12/1446  Acute systolic heart failure (HCC)    Arthritis    CHF (congestive heart failure) (HCC)    CKD (chronic kidney disease) stage 4, GFR 15-29 ml/min (HCC)    Dyspnea    with exertionm periodically   [place]   anterior MI    History of kidney stones    Hyperparathyroidism ([place]    Hypertension 2002   Ischemic cardiomyopathy    Left knee pain    Syncope 04/04/2019   after blood draw   Vitamin D deficiency     Current Outpatient Medications  Medication Sig Dispense Refill   Alcohol Swabs (B-D SINGLE USE SWABS REGULAR) PADS USE AS DIRECTED 100 each 0   allopurinol (ZYLOPRIM) 100 MG tablet Take 100 mg by mouth daily.      [START ON 03/04/2021] aspirin 81 MG chewable tablet Chew 1 tablet (81 mg total) by mouth daily.     atorvastatin (LIPITOR) 80 MG tablet TAKE 1 TABLET BY MOUTH DAILY AT 6PM 90 tablet 0   blood glucose meter kit and supplies Dispense based on patient and insurance preference. Use up to four times daily as directed. (FOR ICD-10 E10.9, E11.9). 1 each 0   BRILINTA 60 MG TABS tablet Take 1 tablet by mouth twice daily 180 tablet 3   carvedilol (COREG) 12.5 MG tablet TAKE 1 TABLET BY MOUTH TWICE DAILY WITH MEALS  180 tablet 0   cinacalcet (SENSIPAR) 30 MG tablet Take 30 mg by mouth daily.     colchicine 0.6 MG tablet Take 0.6 mg by mouth daily.     fluticasone (FLONASE) 50 MCG/ACT nasal spray Place 2 sprays into both nostrils daily. 16 g 6   furosemide (LASIX) 40 MG tablet Take 1 tablet (40 mg total) by mouth daily. 30 tablet 11   ivabradine (CORLANOR) 5 MG TABS tablet Take 1 tablet (5 mg total) by mouth 2 (two) times daily with a meal. 180 tablet 3   nitroGLYCERIN (NITROSTAT) 0.4 MG SL tablet Place 1 tablet (0.4 mg total) under the tongue every 5 (five) minutes as needed. 25 tablet 2   oxyCODONE-acetaminophen (PERCOCET) 10-325 MG tablet Take 1 tablet by mouth every 6  (six) hours as needed for pain.     pantoprazole (PROTONIX) 40 MG tablet Take 1 tablet (40 mg total) by mouth 2 (two) times daily. 60 tablet 2   potassium citrate (UROCIT-K) 10 MEQ (1080 MG) SR tablet Take 1 tablet by mouth daily.     promethazine-dextromethorphan (PROMETHAZINE-DM) 6.25-15 MG/5ML syrup Take 5 mLs by mouth 4 (four) times daily as needed for cough. (Patient not taking: Reported on 12/31/2020) 118 mL 0   TRUEplus Lancets 30G MISC Use as directed to check blood sugar daily. 100 each 1   No current facility-administered medications for this encounter.    No Known Allergies    Social History   Socioeconomic History   Marital status: Single    Spouse name: Not on file   Number of children: 0   Years of education: Not on file   Highest education level: Some college, no degree  Occupational History   Occupation: retired  Tobacco Use   Smoking status: Former    Packs/day: 0.50    Years: 40.00    Pack years: 20.00    Types: Cigarettes    Quit date: 03/12/2019    Years since quitting: 1.9   Smokeless tobacco: Never  Vaping Use   Vaping Use: Never used  Substance and Sexual Activity   Alcohol use: No   Drug use: No   Sexual activity: Not Currently  Other Topics Concern   Not on file  Social History Narrative   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       Enjoys: fishing, shopping, working around the house       Diet: working on changes, avoiding fried foods, increased veggies   Caffeine: teas some daily   Water: 6-8 cups daily       Wears seat belt    Does not use phone while driving    Oceanographer at home    Social Determinants of Health   Financial Resource Strain: Low Risk    Difficulty of Paying Living Expenses: Not hard at all  Food Insecurity: No Food Insecurity   Worried About Charity fundraiser in the Last Year: Never true   Arboriculturist in the Last Year: Never true  Transportation Needs: No Transportation Needs   Lack of Transportation  (Medical): No   Lack of Transportation (Non-Medical): No  Physical Activity: Insufficiently Active   Days of Exercise per Week: 7 days   Minutes of Exercise per Session: 10 min  Stress: No Stress Concern Present   Feeling of Stress : Not at all  Social Connections: Unknown   Frequency of Communication with Friends and Family: More than three times a week  Frequency of Social Gatherings with Friends and Family: More than three times a week   Attends Religious Services: More than 4 times per year   Active Member of [employer] or Organizations: No   Attends Music therapist: Never   Marital Status: Not on file  Intimate Partner Violence: Not At Risk   Fear of Current or Ex-Partner: No   Emotionally Abused: No   Physically Abused: No   Sexually Abused: No      Family History  Problem Relation Age of Onset   Heart failure Mother    Heart disease Mother    Cancer Mother        breast   Heart attack Father    Hypertension Father    Heart disease Father    Diabetes Brother     There were no vitals filed for this visit.   Wt Readings from Last 3 Encounters:  02/22/21 89.8 kg (198 lb)  08/28/20 92.9 kg (204 lb 12.8 oz)  06/20/20 92.5 kg (204 lb)     PHYSICAL EXAM: General:  Well appearing. No resp difficulty HEENT: normal Neck: supple. no JVD. Carotids 2+ bilat; no bruits. No lymphadenopathy or thryomegaly appreciated. Cor: PMI nondisplaced. Regular rate & rhythm. No rubs, gallops or murmurs. Lungs: clear Abdomen: soft, nontender, nondistended. No hepatosplenomegaly. No bruits or masses. Good bowel sounds. Extremities: no cyanosis, clubbing, rash, edema Neuro: alert & orientedx3, cranial nerves grossly intact. moves all 4 extremities w/o difficulty. Affect pleasant   ASSESSMENT & PLAN:  1. CAD s/p acute anterolateral STEMI 03/12/19 - s/p DES LAD 11/20 - No s/s angina - With recent GI bleed would stop Brilinta. Continue ASA 81 - continue atorvastatin 80 mg.    2. Chronic Systolic HF - EF 16%. Following anteriorlateral stemi - Echo 3/21: EF 20-25%. RV ok.  - Echo 10/22 EF 30-35% in setting of severe anemia - Has [employer] ICD.  - NYHA II. Volume status stable.  - ICD interrogated personally HL Score ). No VT/AF - Continue lasix 40 mg daily takes as needed (takes about 3x/week) - Bidil recently stopped due to low BP (had recent Fe-deficient anemia) - Continue carvedilol 12.5 bid.  - Continue ivabradine 5 mg twice a day.  - Not candidate for Entresto or spiro with CKD IV. - Check labs today  3. CKD IV - due to HTN nephropathy - Last creatinine 3.31 (GFR has ranged 10-20) - Follows with Dr. Earl Lites - GFR too low for SGLT2i  4. HTN - well controlled on current regimen - do not want to drop BP too low with CKD IV  5. Tobacco use - quit after MI  - congratulated on cessation  6. Acute blood loss anemia  - recent admit for severe anemia with hgb 4.6 - EGD/colon as per HPI - continue PPI -   Glori Bickers, MD  10:22 PM

## 2021-03-03 ENCOUNTER — Other Ambulatory Visit (HOSPITAL_COMMUNITY): Payer: Self-pay | Admitting: *Deleted

## 2021-03-03 ENCOUNTER — Inpatient Hospital Stay (HOSPITAL_COMMUNITY)
Admission: RE | Admit: 2021-03-03 | Discharge: 2021-03-03 | Disposition: A | Discharge status: Home / Self Care | Payer: Medicare Other | Source: Ambulatory Visit | Attending: Internal Medicine | Admitting: Internal Medicine

## 2021-03-03 ENCOUNTER — Telehealth (HOSPITAL_COMMUNITY): Payer: Self-pay | Admitting: Vascular Surgery

## 2021-03-03 DIAGNOSIS — I5022 Chronic systolic (congestive) heart failure: Secondary | ICD-10-CM

## 2021-03-03 NOTE — Telephone Encounter (Signed)
Returned pt call  

## 2021-03-04 ENCOUNTER — Encounter (HOSPITAL_COMMUNITY): Payer: Medicare Other | Admitting: Internal Medicine

## 2021-03-04 ENCOUNTER — Other Ambulatory Visit: Payer: Self-pay | Admitting: Cardiology

## 2021-03-04 NOTE — Telephone Encounter (Signed)
Encounter open in error 

## 2021-03-05 ENCOUNTER — Ambulatory Visit (INDEPENDENT_AMBULATORY_CARE_PROVIDER_SITE_OTHER): Payer: Medicare Other | Admitting: Internal Medicine

## 2021-03-05 ENCOUNTER — Encounter: Payer: Self-pay | Admitting: Internal Medicine

## 2021-03-05 ENCOUNTER — Other Ambulatory Visit: Payer: Self-pay

## 2021-03-05 DIAGNOSIS — D500 Iron deficiency anemia secondary to blood loss (chronic): Secondary | ICD-10-CM | POA: Diagnosis not present

## 2021-03-05 DIAGNOSIS — K259 Gastric ulcer, unspecified as acute or chronic, without hemorrhage or perforation: Secondary | ICD-10-CM | POA: Diagnosis not present

## 2021-03-05 MED ORDER — PANTOPRAZOLE SODIUM 40 MG PO TBEC: 40.0000 mg | DELAYED_RELEASE_TABLET | Freq: Two times a day (BID) | ORAL | 5 refills | Status: DC

## 2021-03-05 NOTE — Patient Instructions (Signed)
I am happy to hear you are feeling better.  We will plan on repeat EGD in 8 to 10 weeks to evaluate healing of your ulcer.  Continue on pantoprazole 40 mg twice daily.  I have sent refills to [facility].  Avoid NSAIDs as best as you can.  I am going to order blood work today at [facility] labs to ensure your blood counts are stable.  Follow-up with cardiology and nephrology.  Okay to resume your baby aspirin today.  It was great seeing you again today.  Dr. Abbey Chatters  At [facility] we value your feedback. You may receive a survey about your visit today. Please share your experience as we strive to create trusting relationships with our patients to provide genuine, compassionate, quality care.  We appreciate your understanding and patience as we review any laboratory studies, imaging, and other diagnostic tests that are ordered as we care for you. Our office policy is 5 business days for review of these results, and any emergent or urgent results are addressed in a timely manner for your best interest. If you do not hear from our office in 1 week, please contact us.   We also encourage the use of MyChart, which contains your medical information for your review as well. If you are not enrolled in this feature, an access code is on this after visit summary for your convenience. Thank you for allowing [place] to be involved in your care.  It was great to see you today!  I hope you have a great rest of your Fall!    Elon Alas. Abbey Chatters, D.O. Gastroenterology and Hepatology [facility]

## 2021-03-05 NOTE — Progress Notes (Signed)
Referring Provider: Perlie Mayo, NP Primary Care Physician:  Lindell Spar, MD Primary GI:  Dr. Abbey Chatters  Chief Complaint  Patient presents with   Anemia    Recent hosp admission; No bleeding/dark stool    HPI:   [name] is a 65 y.o. male who presents to clinic today for hospital follow-up visit.  Admitted to [facility] 02/22/2021 for acute on chronic anemia.  Hemoglobin was 4.6 on initial presentation.  He received 4 units of PRBCs.  Underwent EGD which showed prepyloric cratered ulcer with no stigmata or active bleeding.  Colonoscopy WNL besides external hemorrhoids and one single diverticulum.  Patient was discharged on Protonix 40 mg twice daily.  Gastric ulcer felt to be due to chronic NSAID use with meloxicam.  No longer taking this medication.  Biopsies negative for H. pylori. Also with significant cardiac history with ischemic cardiomyopathy with reduced LVEF of 30 to 35% on aspirin and Brilinta.  Has been holding his aspirin.  Today, patient states he is doing well.  No longer having melena.  Abdominal pain improved.  No weakness or fatigue.  Past Medical History:  Diagnosis Date   Acute ST elevation myocardial infarction (STEMI) due to occlusion of mid portion of left anterior descending (LAD) coronary artery ([place]) 63/05/4968   Acute systolic heart failure (HCC)    Arthritis    CHF (congestive heart failure) (HCC)    CKD (chronic kidney disease) stage 4, GFR 15-29 ml/min (HCC)    Dyspnea    with exertionm periodically   Heart attack ([place]) 03/15/2019   anterior MI    History of kidney stones    Hyperparathyroidism ([place])    Hypertension 2002   Ischemic cardiomyopathy    Left knee pain    Syncope 04/04/2019   after blood draw   Vitamin D deficiency     Past Surgical History:  Procedure Laterality Date   AMPUTATION TOE Left 1991   2 digit of left foot   BIOPSY  02/26/2021   Procedure: BIOPSY;  Surgeon: Rogene Houston, MD;  [facility];  Service: Endoscopy;;   CHOLECYSTECTOMY     COLONOSCOPY     COLONOSCOPY WITH PROPOFOL N/A 02/26/2021   Procedure: COLONOSCOPY WITH PROPOFOL;  Surgeon: Rogene Houston, MD;  Location: [facility];  Service: Endoscopy;  Laterality: N/A;   CORONARY/GRAFT ACUTE MI REVASCULARIZATION N/A 03/12/2019   Procedure: Coronary/Graft Acute MI Revascularization;  Surgeon: Belva Crome, MD;  Location: [place] CV LAB;  Service: Cardiovascular;  Laterality: N/A;   ESOPHAGOGASTRODUODENOSCOPY (EGD) WITH PROPOFOL N/A 02/26/2021   Procedure: ESOPHAGOGASTRODUODENOSCOPY (EGD) WITH PROPOFOL;  Surgeon: Rogene Houston, MD;  Location: [facility];  Service: Endoscopy;  Laterality: N/A;   ICD IMPLANT N/A 08/24/2019   Procedure: ICD IMPLANT;  Surgeon: Evans Lance, MD;  Location: [place] CV LAB;  Service: Cardiovascular;  Laterality: N/A;   IR URETERAL STENT LEFT NEW ACCESS W/O SEP NEPHROSTOMY CATH  09/26/2019   LEFT HEART CATH AND CORONARY ANGIOGRAPHY N/A 03/12/2019   Procedure: LEFT HEART CATH AND CORONARY ANGIOGRAPHY;  Surgeon: Belva Crome, MD;  Location: [place] CV LAB;  Service: Cardiovascular;  Laterality: N/A;   LITHOTRIPSY Right    NEPHROLITHOTOMY Left 09/26/2019   Procedure: LEFT NEPHROLITHOTOMY PERCUTANEOUS;  Surgeon: Irine Seal, MD;  Location: [facility] OR[facility];  Service: Urology;  Laterality: Left;    Current Outpatient Medications  Medication Sig Dispense Refill   Alcohol Swabs (B-D SINGLE USE  SWABS REGULAR) PADS USE AS DIRECTED 100 each 0   allopurinol (ZYLOPRIM) 100 MG tablet Take 100 mg by mouth daily.      aspirin 81 MG chewable tablet Chew 1 tablet (81 mg total) by mouth daily.     atorvastatin (LIPITOR) 80 MG tablet TAKE 1 TABLET BY MOUTH DAILY AT 6PM 90 tablet 0   blood glucose meter kit and supplies Dispense based on patient and insurance preference. Use up to four times daily as directed. (FOR ICD-10 E10.9, E11.9). 1 each 0   BRILINTA 60 MG TABS tablet Take 1 tablet by mouth  twice daily 180 tablet 3   carvedilol (COREG) 12.5 MG tablet TAKE 1 TABLET BY MOUTH TWICE DAILY WITH MEALS 180 tablet 0   cinacalcet (SENSIPAR) 30 MG tablet Take 30 mg by mouth daily.     colchicine 0.6 MG tablet Take 0.6 mg by mouth daily.     CORLANOR 5 MG TABS tablet TAKE 1 TABLET BY MOUTH TWICE DAILY WITH A MEAL 60 tablet 0   furosemide (LASIX) 40 MG tablet Take 1 tablet (40 mg total) by mouth daily. (Patient taking differently: Take 40 mg by mouth as needed.) 30 tablet 11   nitroGLYCERIN (NITROSTAT) 0.4 MG SL tablet Place 1 tablet (0.4 mg total) under the tongue every 5 (five) minutes as needed. 25 tablet 2   oxyCODONE-acetaminophen (PERCOCET) 10-325 MG tablet Take 1 tablet by mouth every 6 (six) hours as needed for pain.     potassium citrate (UROCIT-K) 10 MEQ (1080 MG) SR tablet Take 1 tablet by mouth daily.     TRUEplus Lancets 30G MISC Use as directed to check blood sugar daily. 100 each 1   fluticasone (FLONASE) 50 MCG/ACT nasal spray Place 2 sprays into both nostrils daily. (Patient not taking: Reported on 03/05/2021) 16 g 6   pantoprazole (PROTONIX) 40 MG tablet Take 1 tablet (40 mg total) by mouth 2 (two) times daily before a meal. 60 tablet 5   promethazine-dextromethorphan (PROMETHAZINE-DM) 6.25-15 MG/5ML syrup Take 5 mLs by mouth 4 (four) times daily as needed for cough. (Patient not taking: Reported on 03/05/2021) 118 mL 0   No current facility-administered medications for this visit.    Allergies as of 03/05/2021   (No Known Allergies)    Family History  Problem Relation Age of Onset   Heart failure Mother    Heart disease Mother    Cancer Mother        breast   Heart attack Father    Hypertension Father    Heart disease Father    Diabetes Brother     Social History   Socioeconomic History   Marital status: Single    Spouse name: Not on file   Number of children: 0   Years of education: Not on file   Highest education level: Some college, no degree   Occupational History   Occupation: retired  Tobacco Use   Smoking status: Former    Packs/day: 0.50    Years: 40.00    Pack years: 20.00    Types: Cigarettes    Quit date: 03/12/2019    Years since quitting: 1.9   Smokeless tobacco: Never  Vaping Use   Vaping Use: Never used  Substance and Sexual Activity   Alcohol use: No   Drug use: No   Sexual activity: Not Currently  Other Topics Concern   Not on file  Social History Narrative   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  Enjoys: fishing, shopping, working around the house       Diet: working on changes, avoiding fried foods, increased veggies   Caffeine: teas some daily   Water: 6-8 cups daily       Wears seat belt    Does not use phone while driving    Oceanographer at home    Social Determinants of Radio broadcast assistant Strain: Low Risk    Difficulty of Paying Living Expenses: Not hard at all  Food Insecurity: No Food Insecurity   Worried About Charity fundraiser in the Last Year: Never true   Arboriculturist in the Last Year: Never true  Transportation Needs: No Transportation Needs   Lack of Transportation (Medical): No   Lack of Transportation (Non-Medical): No  Physical Activity: Insufficiently Active   Days of Exercise per Week: 7 days   Minutes of Exercise per Session: 10 min  Stress: No Stress Concern Present   Feeling of Stress : Not at all  Social Connections: Unknown   Frequency of Communication with Friends and Family: More than three times a week   Frequency of Social Gatherings with Friends and Family: More than three times a week   Attends Religious Services: More than 4 times per year   Active Member of [employer] or Organizations: No   Attends Archivist Meetings: Never   Marital Status: Not on file    Subjective: Review of Systems  Constitutional:  Negative for chills and fever.  HENT:  Negative for congestion and hearing loss.   Eyes:  Negative for blurred vision and  double vision.  Respiratory:  Negative for cough and shortness of breath.   Cardiovascular:  Negative for chest pain and palpitations.  Gastrointestinal:  Negative for abdominal pain, blood in stool, constipation, diarrhea, heartburn, melena and vomiting.  Genitourinary:  Negative for dysuria and urgency.  Musculoskeletal:  Negative for joint pain and myalgias.  Skin:  Negative for itching and rash.  Neurological:  Negative for dizziness and headaches.  Psychiatric/Behavioral:  Negative for depression. The patient is not nervous/anxious.     Objective: BP 127/86   Pulse 67   Temp (!) 96.9 F (36.1 C) (Temporal)   Ht 6' (1.829 m)   Wt 195 lb 12.8 oz (88.8 kg)   BMI 26.56 kg/m  Physical Exam Constitutional:      Appearance: Normal appearance.  HENT:     Head: Normocephalic and atraumatic.  Eyes:     Extraocular Movements: Extraocular movements intact.     Conjunctiva/sclera: Conjunctivae normal.  Cardiovascular:     Rate and Rhythm: Normal rate and regular rhythm.  Pulmonary:     Effort: Pulmonary effort is normal.     Breath sounds: Normal breath sounds.  Abdominal:     General: Bowel sounds are normal.     Palpations: Abdomen is soft.  Musculoskeletal:        General: Normal range of motion.     Cervical back: Normal range of motion and neck supple.  Skin:    General: Skin is warm.  Neurological:     General: No focal deficit present.     Mental Status: He is alert and oriented to person, place, and time.  Psychiatric:        Mood and Affect: Mood normal.        Behavior: Behavior normal.     Assessment: *Gastric ulcer-likely NSAID induced *Acute on chronic anemia *Abdominal pain-improved *Previous NSAID use  Plan: Patient appears clinically improved today.  States his abdominal pain is improved.  Currently taking pantoprazole twice daily.  Discussed his recent endoscopic procedures and showed him pictures of gastric ulcer.  Continue on pantoprazole 40 mg  twice daily.  Refills sent today.  I will check CBC to ensure his hemoglobin is stable.  Okay to continue on aspirin and Brilinta.  We will plan on repeat EGD in 8 to 10 weeks to ensure healing of his gastric ulcer.  Recent biopsies negative for H. pylori.  As this will be a diagnostic procedure, I think it will be safe for him to continue on aspirin and Brilinta up to time of endoscopy.  Further recommendations to follow.  03/05/2021 8:59 AM   Disclaimer: This note was dictated with voice recognition software. Similar sounding words can inadvertently be transcribed and may not be corrected upon review.

## 2021-03-13 DIAGNOSIS — D500 Iron deficiency anemia secondary to blood loss (chronic): Secondary | ICD-10-CM | POA: Diagnosis not present

## 2021-03-13 LAB — CBC
HCT: 29.2 % — ABNORMAL LOW (ref 38.5–50.0)
Hemoglobin: 9.2 g/dL — ABNORMAL LOW (ref 13.2–17.1)
MCH: 28.5 pg (ref 27.0–33.0)
MCHC: 31.5 g/dL — ABNORMAL LOW (ref 32.0–36.0)
MCV: 90.4 fL (ref 80.0–100.0)
MPV: 10.4 fL (ref 7.5–12.5)
Platelets: 254 10*3/uL (ref 140–400)
RBC: 3.23 10*6/uL — ABNORMAL LOW (ref 4.20–5.80)
RDW: 16.1 % — ABNORMAL HIGH (ref 11.0–15.0)
WBC: 6.1 10*3/uL (ref 3.8–10.8)

## 2021-03-17 NOTE — Progress Notes (Signed)
ADVANCED HF CLINIC NOTE   Primary Cardiologist: Dr. Burt Knack Primary Care: Dr. Orson Ape AHF: Dr. Haroldine Laws   HPI: Mr. Stith is a 65 y.o. male, former smoker with HTN, CKD IV, CAD s/p large anterolateral STEMI 9/79 and systolic HF. Has [employer] ICD.   Cath 03/12/2019 Total occlusion of the mid LAD treated with angioplasty and stenting using a 4.0 x 22 Onyx deployed at 14 atm.  0% stenosis was noted post procedure and TIMI grade III flow increased from 0 to 3.  The final angiographic images of the LAD demonstrates apical occlusion due to embolus. Luminal irregularities in the left main but no agreeable stenosis. Large circumflex with 3 obtuse marginals.  The first marginal is large and has a very distal 90% stenosis in 2 branches.  There is a trifurcation distally in the first marginal. RCA Moderate diffuse luminal irregularities but widely patent. Mid anterior wall to apical akinesis.  EF 30 to 35% acutely.  LVEDP 31 mmHg.  Post cath had low BP and shock but recovered. EF 20% by echo. No significant MR. Creatinine post cath 3.1-3.4. Now followed by nephrology.  Echo 3/21. EF 20-25%. RV normal.  S/P [employer] ICD implant 08/2019   Admitted 5/21 with large left renal stone. S/P left nephrolithotomy.  Admitted to [facility] 10/22 with hgb 4.6 and SCr up to 4.2.Transfused 4u RBCs. Underwent EGD/colonoscopy 28m x 8 mm prepyloric ulcer wuth prepyloric gastritis and duodenitis. Colonoscopy with single diverticulum in the hepatic flexure and small external hemorrhoids. Started on epogen. Scr 3.3 on d/c on 02/27/21.   Bidil stopped due to low BP.  Echo 10/22 EF 30-35%   Today he returns for post hospitalization HF follow up. Breathing has gotten a lot better since hospitalization, can walk on flat ground without dyspnea. Takes Lasix 3x week. Main complaint is knee pain, planning on TKR soon. Overall feeling fine. Denies CP, abnormal bleeding, dizziness, edema, or PND/Orthopnea.  Appetite ok. No fever or chills. Weight at home 191-194 pounds. Taking all medications. No further cocaine use. BP at home ~110s/60s. Asking for refill on pain medication.  Review of systems complete and found to be negative unless listed in HPI.    Past Medical History:  Diagnosis Date   Acute ST elevation myocardial infarction (STEMI) due to occlusion of mid portion of left anterior descending (LAD) coronary artery ([facility] 189/06/1192  Acute systolic heart failure (HCC)    Arthritis    CHF (congestive heart failure) (HCC)    CKD (chronic kidney disease) stage 4, GFR 15-29 ml/min (HCC)    Dyspnea    with exertionm periodically   Heart attack ([facility] 03/15/2019   anterior MI    History of kidney stones    Hyperparathyroidism ([facility]    Hypertension 2002   Ischemic cardiomyopathy    Left knee pain    Syncope 04/04/2019   after blood draw   Vitamin D deficiency    Current Outpatient Medications  Medication Sig Dispense Refill   Alcohol Swabs (B-D SINGLE USE SWABS REGULAR) PADS USE AS DIRECTED 100 each 0   allopurinol (ZYLOPRIM) 100 MG tablet Take 100 mg by mouth daily.      aspirin 81 MG chewable tablet Chew 1 tablet (81 mg total) by mouth daily.     atorvastatin (LIPITOR) 80 MG tablet TAKE 1 TABLET BY MOUTH DAILY AT 6PM 90 tablet 0   blood glucose meter kit and supplies Dispense based on patient and insurance preference. Use up to four  times daily as directed. (FOR ICD-10 E10.9, E11.9). 1 each 0   BRILINTA 60 MG TABS tablet Take 1 tablet by mouth twice daily 180 tablet 3   carvedilol (COREG) 12.5 MG tablet TAKE 1 TABLET BY MOUTH TWICE DAILY WITH MEALS 180 tablet 0   colchicine 0.6 MG tablet Take 0.6 mg by mouth daily.     CORLANOR 5 MG TABS tablet TAKE 1 TABLET BY MOUTH TWICE DAILY WITH A MEAL 60 tablet 0   fluticasone (FLONASE) 50 MCG/ACT nasal spray Place 2 sprays into both nostrils daily. 16 g 6   furosemide (LASIX) 40 MG tablet Take 1 tablet (40 mg total) by mouth daily. (Patient  taking differently: Take 40 mg by mouth as needed.) 30 tablet 11   nitroGLYCERIN (NITROSTAT) 0.4 MG SL tablet Place 1 tablet (0.4 mg total) under the tongue every 5 (five) minutes as needed. 25 tablet 2   oxyCODONE-acetaminophen (PERCOCET) 10-325 MG tablet Take 1 tablet by mouth every 6 (six) hours as needed for pain.     pantoprazole (PROTONIX) 40 MG tablet Take 1 tablet (40 mg total) by mouth 2 (two) times daily before a meal. 60 tablet 5   potassium citrate (UROCIT-K) 10 MEQ (1080 MG) SR tablet Take 1 tablet by mouth daily.     TRUEplus Lancets 30G MISC Use as directed to check blood sugar daily. 100 each 1   cinacalcet (SENSIPAR) 30 MG tablet Take 30 mg by mouth daily. (Patient not taking: Reported on 03/18/2021)     No current facility-administered medications for this encounter.   No Known Allergies  Social History   Socioeconomic History   Marital status: Single    Spouse name: Not on file   Number of children: 0   Years of education: Not on file   Highest education level: Some college, no degree  Occupational History   Occupation: retired  Tobacco Use   Smoking status: Former    Packs/day: 0.50    Years: 40.00    Pack years: 20.00    Types: Cigarettes    Quit date: 03/12/2019    Years since quitting: 2.0   Smokeless tobacco: Never  Vaping Use   Vaping Use: Never used  Substance and Sexual Activity   Alcohol use: No   Drug use: No   Sexual activity: Not Currently  Other Topics Concern   Not on file  Social History Narrative   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       Enjoys: fishing, shopping, working around the house       Diet: working on changes, avoiding fried foods, increased veggies   Caffeine: teas some daily   Water: 6-8 cups daily       Wears seat belt    Does not use phone while driving    Oceanographer at home    Social Determinants of Health   Financial Resource Strain: Low Risk    Difficulty of Paying Living Expenses: Not hard at all   Food Insecurity: No Food Insecurity   Worried About Charity fundraiser in the Last Year: Never true   Arboriculturist in the Last Year: Never true  Transportation Needs: No Transportation Needs   Lack of Transportation (Medical): No   Lack of Transportation (Non-Medical): No  Physical Activity: Insufficiently Active   Days of Exercise per Week: 7 days   Minutes of Exercise per Session: 10 min  Stress: No Stress Concern Present   Feeling of Stress :  Not at all  Social Connections: Unknown   Frequency of Communication with Friends and Family: More than three times a week   Frequency of Social Gatherings with Friends and Family: More than three times a week   Attends Religious Services: More than 4 times per year   Active Member of [employer] or Organizations: No   Attends Music therapist: Never   Marital Status: Not on file  Intimate Partner Violence: Not At Risk   Fear of Current or Ex-Partner: No   Emotionally Abused: No   Physically Abused: No   Sexually Abused: No    Family History  Problem Relation Age of Onset   Heart failure Mother    Heart disease Mother    Cancer Mother        breast   Heart attack Father    Hypertension Father    Heart disease Father    Diabetes Brother    BP (!) 148/97   Pulse 63   Wt 86.6 kg (191 lb)   SpO2 100%   BMI 25.90 kg/m   Wt Readings from Last 3 Encounters:  03/18/21 86.6 kg  03/05/21 88.8 kg  02/22/21 89.8 kg   PHYSICAL EXAM: General:  NAD. No resp difficulty HEENT: Normal Neck: Supple. No JVD. Carotids 2+ bilat; no bruits. No lymphadenopathy or thryomegaly appreciated. Cor: PMI nondisplaced. Regular rate & rhythm. No rubs, gallops or murmurs. Lungs: Clear Abdomen: Soft, nontender, nondistended. No hepatosplenomegaly. No bruits or masses. Good bowel sounds. Extremities: No cyanosis, clubbing, rash, edema Neuro: Alert & oriented x 3, cranial nerves grossly intact. Moves all 4 extremities w/o difficulty. Affect  pleasant.  ECG (personally reviewed): SR with 1st degree AVB PR 220 ms  ICD interrogation: HL Score disabled as of 02/20/21. Thoracic impedence appears stable. No VT/AF, daily activity 1.1 hours.  ASSESSMENT & PLAN: 1. CAD s/p acute anterolateral STEMI 03/12/19 - s/p DES LAD 11/20. - No s/s angina. - Continue ASA 81 + Brilinta.  - With recent GIB, favor stopping Brilinta, however OK to continue per GI. - Continue atorvastatin 80 mg.   2. Chronic Systolic HF - EF 54%. Following anteriorlateral stemi - Echo 3/21: EF 20-25%. RV ok.  - Echo 10/22: EF 30-35% in setting of severe anemia - Has [employer] ICD.  - NYHA II. Volume status stable.  - ICD interrogated personally HL Score disabled as of 02/20/21. Thoracic impedence appears stable. No VT/AF, daily activity 1.1 hours. - Continue lasix 40 mg daily takes as needed (takes about 3x/week). - Bidil recently stopped due to low BP (had recent Fe-deficient anemia). - Continue carvedilol 12.5 mg bid.  - Continue ivabradine 5 mg bid. HR 63 today. - Not candidate for Entresto or spiro with CKD IV. - Check labs today  3. CKD IV - Due to HTN nephropathy - Last creatinine 3.31 (GFR has ranged 10-20) - Follows with Dr. Earl Lites - GFR too low for SGLT2i  4. HTN - BP elevated today, but he has not had his medications yet. Well-controlled on home readings. - Keep off BiDil for now. May add back later. - Do not want to drop BP too low with CKD IV.  5. Tobacco use - Quit after MI.  - Congratulated on continued cessation.  6. Acute blood loss anemia - Recent admit for severe anemia with hgb 4.6. - EGD/colon as per HPI - Continue PPI. - CBC 11/22 stable, hgb 9.2 - GI planning repeat diagnostic EGD in 8-10 weeks  7. Pre-op  clearance for TKA - Per Dr. Clayborne Dana previous recommendations from office visit 06/27/20, from cardiac perspective, he is doing well.  - Moderate risk for peri-op CV complications but ok to proceed.  - With  advanced renal disease likely needs clearance from his Nephrologist as well.  - Needs follow up with Pain management for refills for narcotic pain medication for knee OA.   Follow up 3-4 months with Dr. Haroldine Laws.  Rafael Bihari, FNP  12:28 PM

## 2021-03-18 ENCOUNTER — Ambulatory Visit (HOSPITAL_COMMUNITY)
Admission: RE | Admit: 2021-03-18 | Discharge: 2021-03-18 | Disposition: A | Discharge status: Home / Self Care | Payer: Medicare Other | Source: Ambulatory Visit | Attending: Family Medicine | Admitting: Family Medicine

## 2021-03-18 ENCOUNTER — Encounter (HOSPITAL_COMMUNITY): Payer: Self-pay

## 2021-03-18 VITALS — BP 148/97 | HR 63 | Wt 191.0 lb

## 2021-03-18 DIAGNOSIS — Z9861 Coronary angioplasty status: Secondary | ICD-10-CM | POA: Diagnosis not present

## 2021-03-18 DIAGNOSIS — I130 Hypertensive heart and chronic kidney disease with heart failure and stage 1 through stage 4 chronic kidney disease, or unspecified chronic kidney disease: Secondary | ICD-10-CM | POA: Diagnosis not present

## 2021-03-18 DIAGNOSIS — I10 Essential (primary) hypertension: Secondary | ICD-10-CM | POA: Diagnosis not present

## 2021-03-18 DIAGNOSIS — I5022 Chronic systolic (congestive) heart failure: Secondary | ICD-10-CM

## 2021-03-18 DIAGNOSIS — N184 Chronic kidney disease, stage 4 (severe): Secondary | ICD-10-CM | POA: Diagnosis not present

## 2021-03-18 DIAGNOSIS — Z87891 Personal history of nicotine dependence: Secondary | ICD-10-CM

## 2021-03-18 DIAGNOSIS — M25569 Pain in unspecified knee: Secondary | ICD-10-CM | POA: Insufficient documentation

## 2021-03-18 DIAGNOSIS — Z7901 Long term (current) use of anticoagulants: Secondary | ICD-10-CM | POA: Diagnosis not present

## 2021-03-18 DIAGNOSIS — D649 Anemia, unspecified: Secondary | ICD-10-CM

## 2021-03-18 DIAGNOSIS — Z955 Presence of coronary angioplasty implant and graft: Secondary | ICD-10-CM | POA: Diagnosis not present

## 2021-03-18 DIAGNOSIS — I2510 Atherosclerotic heart disease of native coronary artery without angina pectoris: Secondary | ICD-10-CM

## 2021-03-18 DIAGNOSIS — I252 Old myocardial infarction: Secondary | ICD-10-CM | POA: Diagnosis not present

## 2021-03-18 DIAGNOSIS — D500 Iron deficiency anemia secondary to blood loss (chronic): Secondary | ICD-10-CM | POA: Insufficient documentation

## 2021-03-18 DIAGNOSIS — Z09 Encounter for follow-up examination after completed treatment for conditions other than malignant neoplasm: Secondary | ICD-10-CM | POA: Diagnosis not present

## 2021-03-18 DIAGNOSIS — Z01818 Encounter for other preprocedural examination: Secondary | ICD-10-CM

## 2021-03-18 DIAGNOSIS — Z79899 Other long term (current) drug therapy: Secondary | ICD-10-CM | POA: Diagnosis not present

## 2021-03-18 LAB — BASIC METABOLIC PANEL
Anion gap: 8 (ref 5–15)
BUN: 26 mg/dL — ABNORMAL HIGH (ref 8–23)
CO2: 23 mmol/L (ref 22–32)
Calcium: 10.4 mg/dL — ABNORMAL HIGH (ref 8.9–10.3)
Chloride: 108 mmol/L (ref 98–111)
Creatinine, Ser: 3.48 mg/dL — ABNORMAL HIGH (ref 0.61–1.24)
GFR, Estimated: 19 mL/min — ABNORMAL LOW (ref >60)
Glucose, Bld: 128 mg/dL — ABNORMAL HIGH (ref 70–99)
Potassium: 4.2 mmol/L (ref 3.5–5.1)
Sodium: 139 mmol/L (ref 135–145)

## 2021-03-18 NOTE — Patient Instructions (Addendum)
It was great to see you today! No medication changes are needed at this time.  Labs today We will only contact you if something comes back abnormal or we need to make some changes. Otherwise no news is good news!  Please be sure to schedule a follow up with Dr Adam Phenix  Your physician recommends that you schedule a follow-up appointment in: 3-4 months with Dr Haroldine Laws  Do the following things EVERYDAY: Weigh yourself in the morning before breakfast. Write it down and keep it in a log. Take your medicines as prescribed Eat low salt foods--Limit salt (sodium) to 2000 mg per day.  Stay as active as you can everyday Limit all fluids for the day to less than 2 liters  At the [facility], you and your health needs are our priority. As part of our continuing mission to provide you with exceptional heart care, we have created designated Provider Care Teams. These Care Teams include your primary Cardiologist (physician) and Advanced Practice Providers (APPs- Physician Assistants and Nurse Practitioners) who all work together to provide you with the care you need, when you need it.   You may see any of the following providers on your designated Care Team at your next follow up: Dr Glori Bickers Dr Haynes Kerns, NP Lyda Jester, [place] [facility] [place], [place] Audry Riles, PharmD   Please be sure to bring in all your medications bottles to every appointment.

## 2021-04-01 ENCOUNTER — Ambulatory Visit (INDEPENDENT_AMBULATORY_CARE_PROVIDER_SITE_OTHER): Payer: Medicare Other | Admitting: *Deleted

## 2021-04-01 DIAGNOSIS — I5022 Chronic systolic (congestive) heart failure: Secondary | ICD-10-CM

## 2021-04-01 DIAGNOSIS — I2510 Atherosclerotic heart disease of native coronary artery without angina pectoris: Secondary | ICD-10-CM

## 2021-04-01 NOTE — Chronic Care Management (AMB) (Signed)
Chronic Care Management   CCM RN Visit Note  04/01/2021 Name: Danel Requena MRN: [id number] DOB: 1956-02-04  Subjective: Alejandra Barna is a 65 y.o. year old male who is a primary care patient of Lindell Spar, MD. The care management team was consulted for assistance with disease management and care coordination needs.    Engaged with patient by telephone for initial visit in response to provider referral for case management and/or care coordination services.   Consent to Services:  The patient was given the following information about Chronic Care Management services today, agreed to services, and gave verbal consent: 1. CCM service includes personalized support from designated clinical staff supervised by the primary care provider, including individualized plan of care and coordination with other care providers 2. 24/7 contact phone numbers for assistance for urgent and routine care needs. 3. Service will only be billed when office clinical staff spend 20 minutes or more in a month to coordinate care. 4. Only one practitioner may furnish and bill the service in a calendar month. 5.The patient may stop CCM services at any time (effective at the end of the month) by phone call to the office staff. 6. The patient will be responsible for cost sharing (co-pay) of up to 20% of the service fee (after annual deductible is met). Patient agreed to services and consent obtained.  Patient agreed to services and verbal consent obtained.   Assessment: Review of patient past medical history, allergies, medications, health status, including review of consultants reports, laboratory and other test data, was performed as part of comprehensive evaluation and provision of chronic care management services.   SDOH (Social Determinants of Health) assessments and interventions performed:  SDOH Interventions    Flowsheet Row Most Recent Value  SDOH Interventions   Food Insecurity Interventions Intervention  Not Indicated  Transportation Interventions Intervention Not Indicated        CCM Care Plan  No Known Allergies  Outpatient Encounter Medications as of 04/01/2021  Medication Sig Note   allopurinol (ZYLOPRIM) 100 MG tablet Take 100 mg by mouth daily.     aspirin 81 MG chewable tablet Chew 1 tablet (81 mg total) by mouth daily.    atorvastatin (LIPITOR) 80 MG tablet TAKE 1 TABLET BY MOUTH DAILY AT 6PM    BRILINTA 60 MG TABS tablet Take 1 tablet by mouth twice daily    carvedilol (COREG) 12.5 MG tablet TAKE 1 TABLET BY MOUTH TWICE DAILY WITH MEALS    colchicine 0.6 MG tablet Take 0.6 mg by mouth daily.    CORLANOR 5 MG TABS tablet TAKE 1 TABLET BY MOUTH TWICE DAILY WITH A MEAL    fluticasone (FLONASE) 50 MCG/ACT nasal spray Place 2 sprays into both nostrils daily.    furosemide (LASIX) 40 MG tablet Take 1 tablet (40 mg total) by mouth daily. (Patient taking differently: Take 40 mg by mouth as needed.)    nitroGLYCERIN (NITROSTAT) 0.4 MG SL tablet Place 1 tablet (0.4 mg total) under the tongue every 5 (five) minutes as needed. 02/23/2021: Need refill   pantoprazole (PROTONIX) 40 MG tablet Take 1 tablet (40 mg total) by mouth 2 (two) times daily before a meal.    potassium citrate (UROCIT-K) 10 MEQ (1080 MG) SR tablet Take 1 tablet by mouth daily.    Alcohol Swabs (B-D SINGLE USE SWABS REGULAR) PADS USE AS DIRECTED (Patient not taking: Reported on 04/01/2021)    blood glucose meter kit and supplies Dispense based on patient and insurance preference. Use  up to four times daily as directed. (FOR ICD-10 E10.9, E11.9). (Patient not taking: Reported on 04/01/2021)    cinacalcet (SENSIPAR) 30 MG tablet Take 30 mg by mouth daily. (Patient not taking: Reported on 03/18/2021)    oxyCODONE-acetaminophen (PERCOCET) 10-325 MG tablet Take 1 tablet by mouth every 6 (six) hours as needed for pain. (Patient not taking: Reported on 04/01/2021) 02/23/2021: No pharmacy records to verify    TRUEplus Lancets 30G  MISC Use as directed to check blood sugar daily. (Patient not taking: Reported on 04/01/2021)    No facility-administered encounter medications on file as of 04/01/2021.    Patient Active Problem List   Diagnosis Date Noted   Anemia due to chronic blood loss 03/05/2021   Gastric ulcer 03/05/2021   Type 2 diabetes with nephropathy (HCC)    Acute anemia 02/25/2021   LUQ pain    Symptomatic anemia 02/22/2021   Prediabetes 02/22/2021   SOB (shortness of breath)    Left renal stone 09/26/2019   CAD in native artery    Ischemic cardiomyopathy 16/02/9603   Acute systolic heart failure ([place]) 03/22/2019   Congestive heart failure ([place]) 03/21/2019   ST elevation myocardial infarction involving left anterior descending (LAD) coronary artery (HCC)    Chronic kidney disease, stage IV (severe) ([place]) 06/11/2016   Elevated PSA 06/11/2016   Hyperlipidemia 06/11/2016   Essential hypertension 05/28/2016    Conditions to be addressed/monitored:CHF and CAD  Care Plan : RN Care Manager plan of care  Updates made by Kassie Mends, RN since 04/01/2021 12:00 AM     Problem: No plan of care established for management of chronic disease states (CHF, CAD, CKD stage 4, HLD, Anemia)   Priority: High     Long-Range Goal: Development of plan of care for chronic disease management (CHF, CAD, CKD stage 4, HLD, Anemia)   Start Date: 04/01/2021  Expected End Date: 09/28/2021  Note:   Current Barriers:  Knowledge Deficits related to plan of care for management of CHF and CAD - Patient can benefit from teaching/ reinforcement of CHF action plan, diet. Patient reports he and elderly mother live together and he is her primary caregiver, has 2 brothers that he can call on for assistance if needed.  Pt reports he does not have advanced directives and requests documents be mailed.  Patient reports he does weigh daily, reports he does not have diabetes and does not check CBG and is not on medication.  Recent  hospitalization 02/22/21 for Anemia- chronic blood loss, reports feeling better since this hospitalization, pt reports he walks daily, is independent with ADL, IADL's, continues to drive.   RNCM Clinical Goal(s):  Patient will verbalize understanding of plan for management of CHF and CAD as evidenced by patient report, review of EHR and  through collaboration with RN Care manager, provider, and care team.   Interventions: 1:1 collaboration with primary care provider regarding development and update of comprehensive plan of care as evidenced by provider attestation and co-signature Inter-disciplinary care team collaboration (see longitudinal plan of care) Evaluation of current treatment plan related to  self management and patient's adherence to plan as established by provider  CAD Interventions: (Status:  New goal.) Long Term Goal Assessed understanding of CAD diagnosis Medications reviewed including medications utilized in CAD treatment plan Provided education on importance of blood pressure control in management of CAD Counseled on importance of regular laboratory monitoring as prescribed Counseled on the importance of exercise goals with target of 150 minutes per  week Reviewed Importance of taking all medications as prescribed Reviewed Importance of attending all scheduled provider appointments Screening for signs and symptoms of depression related to chronic disease state Assessed social determinant of health barriers Advanced directives packed mailed to patient  Heart Failure Interventions:  (Status:  New goal.) Long Term Goal Basic overview and discussion of pathophysiology of Heart Failure reviewed Provided education on low sodium diet Reviewed Heart Failure Action Plan in depth and provided written copy Assessed need for readable accurate scales in home Provided education about placing scale on hard, flat surface Advised patient to weigh each morning after emptying  bladder Discussed importance of daily weight and advised patient to weigh and record daily Reviewed upcoming scheduled appointments- 04/23/21 primary care provider, 06/05/21 Dr. Jeffie Pollock, 06/18/21 Dr. Haroldine Laws  Patient Goals/Self-Care Activities: Take all medications as prescribed Attend all scheduled provider appointments Perform all self care activities independently  Perform IADL's (shopping, preparing meals, housekeeping, managing finances) independently Call provider office for new concerns or questions  Continue to weigh daily and record Follow low sodium diet- avoid fast food and salty snacks Look over education mailed- Heart Failure action plan and low sodium diet Advanced directives packet mailed to you- contact RN care manager if any questions at [phone]  Follow Up Plan:  Telephone follow up appointment with care management team member scheduled for:  06/03/2020       Plan:Telephone follow up appointment with care management team member scheduled for:  06/03/2020  Jacqlyn Larsen [facility], BSN RN Case Manager [facility] [phone]

## 2021-04-01 NOTE — Patient Instructions (Addendum)
Visit Information   Thank you for taking time to visit with me today. Please don't hesitate to contact me if I can be of assistance to you before our next scheduled telephone appointment.  Following are the goals we discussed today:  Take all medications as prescribed Attend all scheduled provider appointments Perform all self care activities independently  Perform IADL's (shopping, preparing meals, housekeeping, managing finances) independently Call provider office for new concerns or questions  Continue to weigh daily and record Follow low sodium diet- avoid fast food and salty snacks Look over education mailed- Heart Failure action plan and low sodium diet Advanced directives packet mailed to you- contact RN care manager if any questions at [phone]  Low-Sodium Eating Plan Sodium, which is an element that makes up salt, helps you maintain a healthy balance of fluids in your body. Too much sodium can increase your blood pressure and cause fluid and waste to be held in your body. Your health care provider or dietitian may recommend following this plan if you have high blood pressure (hypertension), kidney disease, liver disease, or heart failure. Eating less sodium can help lower your blood pressure, reduce swelling, and protect your heart, liver, and kidneys. What are tips for following this plan? Reading food labels The Nutrition Facts label lists the amount of sodium in one serving of the food. If you eat more than one serving, you must multiply the listed amount of sodium by the number of servings. Choose foods with less than 140 mg of sodium per serving. Avoid foods with 300 mg of sodium or more per serving. Shopping  Look for lower-sodium products, often labeled as "low-sodium" or "no salt added." Always check the sodium content, even if foods are labeled as "unsalted" or "no salt added." Buy fresh foods. Avoid canned foods and pre-made or frozen meals. Avoid canned, cured, or  processed meats. Buy breads that have less than 80 mg of sodium per slice. Cooking  Eat more home-cooked food and less restaurant, buffet, and fast food. Avoid adding salt when cooking. Use salt-free seasonings or herbs instead of table salt or sea salt. Check with your health care provider or pharmacist before using salt substitutes. Cook with plant-based oils, such as canola, sunflower, or olive oil. Meal planning When eating at a restaurant, ask that your food be prepared with less salt or no salt, if possible. Avoid dishes labeled as brined, pickled, cured, smoked, or made with soy sauce, miso, or teriyaki sauce. Avoid foods that contain MSG (monosodium glutamate). MSG is sometimes added to [place] food, bouillon, and some canned foods. Make meals that can be grilled, baked, poached, roasted, or steamed. These are generally made with less sodium. General information Most people on this plan should limit their sodium intake to 1,500-2,000 mg (milligrams) of sodium each day. What foods should I eat? Fruits Fresh, frozen, or canned fruit. Fruit juice. Vegetables Fresh or frozen vegetables. "No salt added" canned vegetables. "No salt added" tomato sauce and paste. Low-sodium or reduced-sodium tomato and vegetable juice. Grains Low-sodium cereals, including oats, puffed wheat and rice, and shredded wheat. Low-sodium crackers. Unsalted rice. Unsalted pasta. Low-sodium bread. Whole-grain breads and whole-grain pasta. Meats and other proteins Fresh or frozen (no salt added) meat, poultry, seafood, and fish. Low-sodium canned tuna and salmon. Unsalted nuts. Dried peas, beans, and lentils without added salt. Unsalted canned beans. Eggs. Unsalted nut butters. Dairy Milk. Soy milk. Cheese that is naturally low in sodium, such as ricotta cheese, fresh mozzarella, or Swiss  cheese. Low-sodium or reduced-sodium cheese. Cream cheese. Yogurt. Seasonings and condiments Fresh and dried herbs and spices.  Salt-free seasonings. Low-sodium mustard and ketchup. Sodium-free salad dressing. Sodium-free light mayonnaise. Fresh or refrigerated horseradish. Lemon juice. Vinegar. Other foods Homemade, reduced-sodium, or low-sodium soups. Unsalted popcorn and pretzels. Low-salt or salt-free chips. The items listed above may not be a complete list of foods and beverages you can eat. Contact a dietitian for more information. What foods should I avoid? Vegetables Sauerkraut, pickled vegetables, and relishes. Olives. [place] fries. Onion rings. Regular canned vegetables (not low-sodium or reduced-sodium). Regular canned tomato sauce and paste (not low-sodium or reduced-sodium). Regular tomato and vegetable juice (not low-sodium or reduced-sodium). Frozen vegetables in sauces. Grains Instant hot cereals. Bread stuffing, pancake, and biscuit mixes. Croutons. Seasoned rice or pasta mixes. Noodle soup cups. Boxed or frozen macaroni and cheese. Regular salted crackers. Self-rising flour. Meats and other proteins Meat or fish that is salted, canned, smoked, spiced, or pickled. Precooked or cured meat, such as sausages or meat loaves. Berniece Salines. Ham. Pepperoni. Hot dogs. Corned beef. Chipped beef. Salt pork. Jerky. Pickled herring. Anchovies and sardines. Regular canned tuna. Salted nuts. Dairy Processed cheese and cheese spreads. Hard cheeses. Cheese curds. Blue cheese. Feta cheese. String cheese. Regular cottage cheese. Buttermilk. Canned milk. Fats and oils Salted butter. Regular margarine. Ghee. Bacon fat. Seasonings and condiments Onion salt, garlic salt, seasoned salt, table salt, and sea salt. Canned and packaged gravies. Worcestershire sauce. Tartar sauce. Barbecue sauce. Teriyaki sauce. Soy sauce, including reduced-sodium. Steak sauce. Fish sauce. Oyster sauce. Cocktail sauce. Horseradish that you find on the shelf. Regular ketchup and mustard. Meat flavorings and tenderizers. Bouillon cubes. Hot sauce. Pre-made or  packaged marinades. Pre-made or packaged taco seasonings. Relishes. Regular salad dressings. Salsa. Other foods Salted popcorn and pretzels. Corn chips and puffs. Potato and tortilla chips. Canned or dried soups. Pizza. Frozen entrees and pot pies. The items listed above may not be a complete list of foods and beverages you should avoid. Contact a dietitian for more information. Summary Eating less sodium can help lower your blood pressure, reduce swelling, and protect your heart, liver, and kidneys. Most people on this plan should limit their sodium intake to 1,500-2,000 mg (milligrams) of sodium each day. Canned, boxed, and frozen foods are high in sodium. Restaurant foods, fast foods, and pizza are also very high in sodium. You also get sodium by adding salt to food. Try to cook at home, eat more fresh fruits and vegetables, and eat less fast food and canned, processed, or prepared foods. This information is not intended to replace advice given to you by your health care provider. Make sure you discuss any questions you have with your health care provider. Document Revised: 05/26/2019 Document Reviewed: 03/22/2019 Elsevier Patient Education  2022 [place]. Heart Failure Action Plan A heart failure action plan helps you understand what to do when you have symptoms of heart failure. Your action plan is a color-coded plan that lists the symptoms to watch for and indicates what actions to take. If you have symptoms in the red zone, you need medical care right away. If you have symptoms in the yellow zone, you are having problems. If you have symptoms in the green zone, you are doing well. Follow the plan that was created by you and your health care provider. Review your plan each time you visit your health care provider. Red zone These signs and symptoms mean you should get medical help right away: You have  trouble breathing when resting. You have a dry cough that is getting worse. You have  swelling or pain in your legs or abdomen that is getting worse. You suddenly gain more than 2-3 lb (0.9-1.4 kg) in 24 hours, or more than 5 lb (2.3 kg) in a week. This amount may be more or less depending on your condition. You have trouble staying awake or you feel confused. You have chest pain. You do not have an appetite. You pass out. You have worsening sadness or depression. If you have any of these symptoms, call your local emergency services (911 in the [place] right away. Do not drive yourself to the hospital. Yellow zone These signs and symptoms mean your condition may be getting worse and you should make some changes: You have trouble breathing when you are active, or you need to sleep with your head raised on extra pillows to help you breathe. You have swelling in your legs or abdomen. You gain 2-3 lb (0.9-1.4 kg) in 24 hours, or 5 lb (2.3 kg) in a week. This amount may be more or less depending on your condition. You get tired easily. You have trouble sleeping. You have a dry cough. If you have any of these symptoms: Contact your health care provider within the next day. Your health care provider may adjust your medicines. Green zone These signs mean you are doing well and can continue what you are doing: You do not have shortness of breath. You have very little swelling or no new swelling. Your weight is stable (no gain or loss). You have a normal activity level. You do not have chest pain or any other new symptoms. Follow these instructions at home: Take over-the-counter and prescription medicines only as told by your health care provider. Weigh yourself daily. Your target weight is __________ lb (__________ kg). Call your health care provider if you gain more than __________ lb (__________ kg) in 24 hours, or more than __________ lb (__________ kg) in a week. Health care provider name: _____________________________________________________ Health care provider phone number:  _____________________________________________________ Eat a heart-healthy diet. Work with a diet and nutrition specialist (dietitian) to create an eating plan that is best for you. Keep all follow-up visits. This is important. Where to find more information American Heart Association: www.heart.org Summary A heart failure action plan helps you understand what to do when you have symptoms of heart failure. Follow the action plan that was created by you and your health care provider. Get help right away if you have any symptoms in the red zone. This information is not intended to replace advice given to you by your health care provider. Make sure you discuss any questions you have with your health care provider. Document Revised: 12/04/2019 Document Reviewed: 12/04/2019 Elsevier Patient Education  2022 [place] next appointment is by telephone on 06/03/2020 at 915 am  Please call the care guide team at [phone] if you need to cancel or reschedule your appointment.   If you are experiencing a Mental Health or [place] or need someone to talk to, please call the [place] National Suicide Prevention Lifeline: 805 074 9488 or TTY: 212-428-8123 TTY 818-187-5472) to talk to a trained counselor go to [facility] [address] [place] [phone]) call the [place]: [phone] call 911   Following is a copy of your full care plan:  Care Plan : RN Care Manager plan of care  Updates made by Jacqlyn Larsen  A, RN since 04/01/2021 12:00 AM     Problem: No plan of care established for management of chronic disease states (CHF, CAD, CKD stage 4, HLD, Anemia)   Priority: High     Long-Range Goal: Development of plan of care for chronic disease management (CHF, CAD, CKD stage 4, HLD, Anemia)   Start Date: 04/01/2021  Expected End Date: 09/28/2021  Note:   Current Barriers:  Knowledge Deficits related to  plan of care for management of CHF and CAD - Patient can benefit from teaching/ reinforcement of CHF action plan, diet. Patient reports he and elderly mother live together and he is her primary caregiver, has 2 brothers that he can call on for assistance if needed.  Pt reports he does not have advanced directives and requests documents be mailed.  Patient reports he does weigh daily, reports he does not have diabetes and does not check CBG and is not on medication.  Recent hospitalization 02/22/21 for Anemia- chronic blood loss, reports feeling better since this hospitalization, pt reports he walks daily, is independent with ADL, IADL's, continues to drive.   RNCM Clinical Goal(s):  Patient will verbalize understanding of plan for management of CHF and CAD as evidenced by patient report, review of EHR and  through collaboration with RN Care manager, provider, and care team.   Interventions: 1:1 collaboration with primary care provider regarding development and update of comprehensive plan of care as evidenced by provider attestation and co-signature Inter-disciplinary care team collaboration (see longitudinal plan of care) Evaluation of current treatment plan related to  self management and patient's adherence to plan as established by provider  CAD Interventions: (Status:  New goal.) Long Term Goal Assessed understanding of CAD diagnosis Medications reviewed including medications utilized in CAD treatment plan Provided education on importance of blood pressure control in management of CAD Counseled on importance of regular laboratory monitoring as prescribed Counseled on the importance of exercise goals with target of 150 minutes per week Reviewed Importance of taking all medications as prescribed Reviewed Importance of attending all scheduled provider appointments Screening for signs and symptoms of depression related to chronic disease state Assessed social determinant of health  barriers Advanced directives packed mailed to patient  Heart Failure Interventions:  (Status:  New goal.) Long Term Goal Basic overview and discussion of pathophysiology of Heart Failure reviewed Provided education on low sodium diet Reviewed Heart Failure Action Plan in depth and provided written copy Assessed need for readable accurate scales in home Provided education about placing scale on hard, flat surface Advised patient to weigh each morning after emptying bladder Discussed importance of daily weight and advised patient to weigh and record daily Reviewed upcoming scheduled appointments- 04/23/21 primary care provider, 06/05/21 Dr. Jeffie Pollock, 06/18/21 Dr. Haroldine Laws  Patient Goals/Self-Care Activities: Take all medications as prescribed Attend all scheduled provider appointments Perform all self care activities independently  Perform IADL's (shopping, preparing meals, housekeeping, managing finances) independently Call provider office for new concerns or questions  Continue to weigh daily and record Follow low sodium diet- avoid fast food and salty snacks Look over education mailed- Heart Failure action plan and low sodium diet Advanced directives packet mailed to you- contact RN care manager if any questions at [phone]  Follow Up Plan:  Telephone follow up appointment with care management team member scheduled for:  06/03/2020       Consent to CCM Services: Mr. Katich was given information about Chronic Care Management services including:  CCM service includes personalized support from  designated clinical staff supervised by his physician, including individualized plan of care and coordination with other care providers 24/7 contact phone numbers for assistance for urgent and routine care needs. Service will only be billed when office clinical staff spend 20 minutes or more in a month to coordinate care. Only one practitioner may furnish and bill the service in a calendar  month. The patient may stop CCM services at any time (effective at the end of the month) by phone call to the office staff. The patient will be responsible for cost sharing (co-pay) of up to 20% of the service fee (after annual deductible is met).  Patient agreed to services and verbal consent obtained.   The patient verbalized understanding of instructions, educational materials, and care plan provided today and agreed to receive a mailed copy of patient instructions, educational materials, and care plan.   Telephone follow up appointment with care management team member scheduled for:  06/03/2020

## 2021-04-02 DIAGNOSIS — I5022 Chronic systolic (congestive) heart failure: Secondary | ICD-10-CM | POA: Diagnosis not present

## 2021-04-02 DIAGNOSIS — I2510 Atherosclerotic heart disease of native coronary artery without angina pectoris: Secondary | ICD-10-CM

## 2021-04-02 DIAGNOSIS — N184 Chronic kidney disease, stage 4 (severe): Secondary | ICD-10-CM | POA: Diagnosis not present

## 2021-04-02 DIAGNOSIS — E210 Primary hyperparathyroidism: Secondary | ICD-10-CM | POA: Diagnosis not present

## 2021-04-02 DIAGNOSIS — I129 Hypertensive chronic kidney disease with stage 1 through stage 4 chronic kidney disease, or unspecified chronic kidney disease: Secondary | ICD-10-CM | POA: Diagnosis not present

## 2021-04-02 DIAGNOSIS — D508 Other iron deficiency anemias: Secondary | ICD-10-CM | POA: Diagnosis not present

## 2021-04-03 ENCOUNTER — Ambulatory Visit: Payer: Medicare Other | Admitting: Urology

## 2021-04-03 DIAGNOSIS — E210 Primary hyperparathyroidism: Secondary | ICD-10-CM | POA: Diagnosis not present

## 2021-04-03 DIAGNOSIS — I129 Hypertensive chronic kidney disease with stage 1 through stage 4 chronic kidney disease, or unspecified chronic kidney disease: Secondary | ICD-10-CM | POA: Diagnosis not present

## 2021-04-03 DIAGNOSIS — D500 Iron deficiency anemia secondary to blood loss (chronic): Secondary | ICD-10-CM | POA: Diagnosis not present

## 2021-04-03 DIAGNOSIS — N184 Chronic kidney disease, stage 4 (severe): Secondary | ICD-10-CM | POA: Diagnosis not present

## 2021-04-03 DIAGNOSIS — D508 Other iron deficiency anemias: Secondary | ICD-10-CM | POA: Diagnosis not present

## 2021-04-03 DIAGNOSIS — I5022 Chronic systolic (congestive) heart failure: Secondary | ICD-10-CM | POA: Diagnosis not present

## 2021-04-04 ENCOUNTER — Other Ambulatory Visit: Payer: Self-pay | Admitting: *Deleted

## 2021-04-04 ENCOUNTER — Telehealth: Payer: Self-pay | Admitting: Internal Medicine

## 2021-04-04 MED ORDER — NITROGLYCERIN 0.4 MG SL SUBL: 0.4000 mg | SUBLINGUAL_TABLET | SUBLINGUAL | 2 refills | Status: DC | PRN

## 2021-04-04 NOTE — Telephone Encounter (Signed)
Rx sent in to pharmacy. 

## 2021-04-04 NOTE — Telephone Encounter (Signed)
Pt called in for refill on   nitroGLYCERIN (NITROSTAT) 0.4 MG SL tablet    Sent to [facility]

## 2021-04-08 ENCOUNTER — Other Ambulatory Visit (HOSPITAL_COMMUNITY): Payer: Self-pay | Admitting: Nephrology

## 2021-04-08 DIAGNOSIS — E210 Primary hyperparathyroidism: Secondary | ICD-10-CM

## 2021-04-11 ENCOUNTER — Encounter (HOSPITAL_COMMUNITY)
Admission: RE | Admit: 2021-04-11 | Discharge: 2021-04-11 | Disposition: A | Discharge status: Home / Self Care | Payer: Medicare Other | Source: Ambulatory Visit | Attending: Nephrology | Admitting: Nephrology

## 2021-04-11 ENCOUNTER — Other Ambulatory Visit: Payer: Self-pay

## 2021-04-11 DIAGNOSIS — E210 Primary hyperparathyroidism: Secondary | ICD-10-CM

## 2021-04-11 MED ORDER — TECHNETIUM TC 99M SESTAMIBI - CARDIOLITE
25.0000 | Freq: Once | INTRAVENOUS | Status: AC | PRN
  Administered 2021-04-11: 10:00:00 26 via INTRAVENOUS

## 2021-04-14 NOTE — Progress Notes (Signed)
Subjective:     Patient ID: [name], male   DOB: November 09, 1955, 65 y.o.   MRN: [id number]  HPI  [name] is a 65 year old man who presents for follow-up of severe bilateral knee OA.   -the oxycodone was the only thing that helped. He says he has been clean of any recreational drugs. He asks whether we can give him another chance to prescribe this for him. Discussed unfortunately for him that our clinic rules do not permit prescription of controlled medications when there has been cocaine present in urine sample. He is not sleeping well due to his pain. Discussed trying amitriptyline at night for pain and insomnia but I would like him to clear this with his cardiologist first.    -He has bilateral knee pain, 10/10, that is sharp, stabbing, and aching.   -He used to walk a lot in his work.   -He has had injections in his bilateral knees with steroid and Monovisc without benefit. He has never tried oral steroids.   -He has tried Ibuprofen and Meloxicam without benefit. Discussed retrying this option with his cardiologist Dr. Haroldine Laws, but it is contraindicated given his stage 2 CKD  -Fortunately he was recently cleared to get a left knee replacement by Dr. Haroldine Laws at his appointment yesterday- wears a Lifevest and has ICD- has been doing well in this regard.   As per patient he was referred here by Dr. Ninfa Linden as [name] is no longer providing benefit and Dr. Ninfa Linden felt Oxycodone may be warranted and should be managed by pain clinic.   Pain Inventory Average Pain 9 Pain Right Now 9 My pain is constant, sharp, and tingling  In the last 24 hours, has pain interfered with the following? General activity 8 Relation with others 8 Enjoyment of life 8 What TIME of day is your pain at its worst? morning  and night Sleep (in general) Poor  Pain is worse with: walking and sitting Pain improves with: medication Relief from Meds: 10        Family History  Problem  Relation Age of Onset   Heart failure Mother    Heart disease Mother    Cancer Mother        breast   Heart attack Father    Hypertension Father    Heart disease Father    Diabetes Brother    Social History   Socioeconomic History   Marital status: Single    Spouse name: Not on file   Number of children: 0   Years of education: Not on file   Highest education level: Some college, no degree  Occupational History   Occupation: retired  Tobacco Use   Smoking status: Former    Packs/day: 0.50    Years: 40.00    Pack years: 20.00    Types: Cigarettes    Quit date: 03/12/2019    Years since quitting: 2.0   Smokeless tobacco: Never  Vaping Use   Vaping Use: Never used  Substance and Sexual Activity   Alcohol use: No   Drug use: No   Sexual activity: Not Currently  Other Topics Concern   Not on file  Social History Narrative   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       Enjoys: fishing, shopping, working around the house       Diet: working on changes, avoiding fried foods, increased veggies   Caffeine: teas some daily   Water: 6-8 cups daily  Wears seat belt    Does not use phone while driving    Smoke detectors at home    Social Determinants of Health   Financial Resource Strain: Low Risk    Difficulty of Paying Living Expenses: Not hard at all  Food Insecurity: No Food Insecurity   Worried About Charity fundraiser in the Last Year: Never true   Arboriculturist in the Last Year: Never true  Transportation Needs: No Transportation Needs   Lack of Transportation (Medical): No   Lack of Transportation (Non-Medical): No  Physical Activity: Insufficiently Active   Days of Exercise per Week: 7 days   Minutes of Exercise per Session: 10 min  Stress: No Stress Concern Present   Feeling of Stress : Not at all  Social Connections: Unknown   Frequency of Communication with Friends and Family: More than three times a week   Frequency of Social Gatherings with  Friends and Family: More than three times a week   Attends Religious Services: More than 4 times per year   Active Member of Clubs or Organizations: No   Attends Archivist Meetings: Never   Marital Status: Not on file   Past Surgical History:  Procedure Laterality Date   AMPUTATION TOE Left 1991   2 digit of left foot   BIOPSY  02/26/2021   Procedure: BIOPSY;  Surgeon: Rogene Houston, MD;  Location: [facility];  Service: Endoscopy;;   CHOLECYSTECTOMY     COLONOSCOPY     COLONOSCOPY WITH PROPOFOL N/A 02/26/2021   Procedure: COLONOSCOPY WITH PROPOFOL;  Surgeon: Rogene Houston, MD;  Location: [facility];  Service: Endoscopy;  Laterality: N/A;   CORONARY/GRAFT ACUTE MI REVASCULARIZATION N/A 03/12/2019   Procedure: Coronary/Graft Acute MI Revascularization;  Surgeon: Belva Crome, MD;  Location: [facility];  Service: Cardiovascular;  Laterality: N/A;   ESOPHAGOGASTRODUODENOSCOPY (EGD) WITH PROPOFOL N/A 02/26/2021   Procedure: ESOPHAGOGASTRODUODENOSCOPY (EGD) WITH PROPOFOL;  Surgeon: Rogene Houston, MD;  Location: [facility];  Service: Endoscopy;  Laterality: N/A;   ICD IMPLANT N/A 08/24/2019   Procedure: ICD IMPLANT;  Surgeon: Evans Lance, MD;  Location: [facility];  Service: Cardiovascular;  Laterality: N/A;   IR URETERAL STENT LEFT NEW ACCESS W/O SEP NEPHROSTOMY CATH  09/26/2019   LEFT HEART CATH AND CORONARY ANGIOGRAPHY N/A 03/12/2019   Procedure: LEFT HEART CATH AND CORONARY ANGIOGRAPHY;  Surgeon: Belva Crome, MD;  Location: [facility];  Service: Cardiovascular;  Laterality: N/A;   LITHOTRIPSY Right    NEPHROLITHOTOMY Left 09/26/2019   Procedure: LEFT NEPHROLITHOTOMY PERCUTANEOUS;  Surgeon: Irine Seal, MD;  Location: [facility];  Service: Urology;  Laterality: Left;   Past Medical History:  Diagnosis Date   Acute ST elevation myocardial infarction (STEMI) due to occlusion of mid portion of left anterior descending (LAD) coronary artery  ([place]) 17/08/9447   Acute systolic heart failure (HCC)    Arthritis    CHF (congestive heart failure) (HCC)    CKD (chronic kidney disease) stage 4, GFR 15-29 ml/min (HCC)    Dyspnea    with exertionm periodically   Heart attack ([place]) 03/15/2019   anterior MI    History of kidney stones    Hyperparathyroidism ([place])    Hypertension 2002   Ischemic cardiomyopathy    Left knee pain    Syncope 04/04/2019   after blood draw   Vitamin D deficiency    BP (!) 149/86  Pulse 68   Temp 98 F (36.7 C) (Oral)   Ht 6' (1.829 m)   Wt 198 lb (89.8 kg)   SpO2 99%   BMI 26.85 kg/m   Opioid Risk Score:   Fall Risk Score:  `1  Depression screen PHQ 2/9  Depression screen [facility] 2/9 04/01/2021 12/31/2020 08/28/2020 05/16/2020 12/27/2019 12/27/2019 07/27/2019  Decreased Interest 0 0 0 0 0 0 0  Down, Depressed, Hopeless 0 0 0 0 0 0 0  PHQ - 2 Score 0 0 0 0 0 0 0  Altered sleeping - - - 0 - - -  Tired, decreased energy - - - 0 - - -  Change in appetite - - - 0 - - -  Feeling bad or failure about yourself  - - - 0 - - -  Trouble concentrating - - - 0 - - -  Moving slowly or fidgety/restless - - - 0 - - -  Suicidal thoughts - - - 0 - - -  PHQ-9 Score - - - 0 - - -    Review of Systems  Constitutional: Negative.   HENT: Negative.    Eyes: Negative.   Respiratory: Negative.    Cardiovascular: Negative.   Gastrointestinal: Negative.   Endocrine: Negative.   Genitourinary: Negative.   Musculoskeletal:  Positive for arthralgias, back pain and gait problem.       Pain in both knees  Skin: Negative.   Allergic/Immunologic: Negative.   Hematological: Negative.   Psychiatric/Behavioral: Negative.    All other systems reviewed and are negative.     Objective:   Physical Exam Gen: no distress, normal appearing, BP 149/86, BMI 26.85 HEENT: oral mucosa pink and moist, NCAT Cardio: Reg rate Chest: normal effort, normal rate of breathing Abd: soft, non-distended Ext: no edema Psych: pleasant,  normal affect Skin: intact Neuro: Alert and oriented x3. Ambulating in office and outside with antalgic gait without AD. Tender to palpation in bilateral knee joint medial to lateral with bony protrusions. +genu valgum, left>right.    Assessment:         Plan:     Mr. Nethaniel Mattie is 73 man who presents for follow-up of bilateral knee OA.  1) Chronic Pain Syndrome secondary to bilateral knee OA -Discussed current symptoms of pain and history of pain.  -Provided with a pain relief journal and discussed that it contains foods and lifestyle tips to naturally help to improve pain. Discussed that these lifestyle strategies are also very good for health unlike some medications which can have negative side effects. Discussed that the act of keeping a journal can be therapeutic and helpful to realize patterns what helps to trigger and alleviate pain.   -Discussed Qutenza as an option for neuropathic pain control. Discussed that this is a capsaicin patch, stronger than capsaicin cream. Discussed that it is currently approved for diabetic peripheral neuropathy and post-herpetic neuralgia, but that it has also shown benefit in treating other forms of neuropathy. Provided patient with link to site to learn more about the patch: CinemaBon[employer].fr. Discussed that the patch would be placed in office and benefits usually last 3 months. Discussed that unintended exposure to capsaicin can cause severe irritation of eyes, mucous membranes, respiratory tract, and skin, but that Qutenza is a local treatment and does not have the systemic side effects of other nerve medications. Discussed that there may be pain, itching, erythema, and decreased sensory function associated with the application of Qutenza. Side effects usually subside within 1  week. A cold pack of analgesic medications can help with these side effects. Blood pressure can also be increased due to pain associated with administration of the patch.   Recommended Wobenzymes -Discussed benefits of exercise in reducing pain. -Discussed following foods that may reduce pain: 1) Ginger (especially studied for arthritis)- reduce leukotriene production to decrease inflammation 2) Blueberries- high in phytonutrients that decrease inflammation 3) Salmon- marine omega-3s reduce joint swelling and pain 4) Pumpkin seeds- reduce inflammation 5) dark chocolate- reduces inflammation 6) turmeric- reduces inflammation 7) tart cherries - reduce pain and stiffness 8) extra virgin olive oil - its compound olecanthal helps to block prostaglandins  9) chili peppers- can be eaten or applied topically via capsaicin 10) mint- helpful for headache, muscle aches, joint pain, and itching 11) garlic- reduces inflammation  Link to further information on diet for chronic pain: http://www.randall.com/    -XRs reviewed and show moderate tricompartmental arthritis.  -UDS obtained previously and is positive for cocaine. He denies cocaine use, but we cannot prescribe controlled substances in this case. Discussed with him and offered referral to [facility] and he is agreeable. He would like to continue following here as well.  -Discussed that opioids have significant side effects of tolerance and dependence as well and are not intended for long-term treatment.  -Plan for steroid injection next visit. If he is able to get a surgical date in the near future, will cancel steroid injection.  Turmeric to reduce inflammation--can be used in cooking or taken as a supplement.  Benefits of turmeric:  -Highly anti-inflammatory  -Increases antioxidants  -Improves memory, attention, brain disease  -Lowers risk of heart disease  -May help prevent cancer  -Decreases pain  -Alleviates depression  -Delays aging and decreases risk of chronic disease  -Consume with black pepper to increase  absorption    Turmeric Milk Recipe:  1 cup milk  1 tsp turmeric  1 tsp cinnamon  1 tsp grated ginger (optional)  Black pepper (boosts the anti-inflammatory properties of turmeric).  1 tsp honey  2) Impaired mobility and ADLs -Provided with handicap placard given antalgic gait and limited mobility  -Provided with script for a single-point cane.   3) stage 4 CKD: -discussed with Dr. Haroldine Laws, advised patient that Meloxicam is contraindicated given stage 4 CKD  4) insomnia -recommended clearing amitriptyline with his cardiologist.

## 2021-04-15 DIAGNOSIS — D508 Other iron deficiency anemias: Secondary | ICD-10-CM | POA: Diagnosis not present

## 2021-04-15 DIAGNOSIS — E210 Primary hyperparathyroidism: Secondary | ICD-10-CM | POA: Diagnosis not present

## 2021-04-15 DIAGNOSIS — N184 Chronic kidney disease, stage 4 (severe): Secondary | ICD-10-CM | POA: Diagnosis not present

## 2021-04-15 DIAGNOSIS — D500 Iron deficiency anemia secondary to blood loss (chronic): Secondary | ICD-10-CM | POA: Diagnosis not present

## 2021-04-15 DIAGNOSIS — I129 Hypertensive chronic kidney disease with stage 1 through stage 4 chronic kidney disease, or unspecified chronic kidney disease: Secondary | ICD-10-CM | POA: Diagnosis not present

## 2021-04-17 DIAGNOSIS — E210 Primary hyperparathyroidism: Secondary | ICD-10-CM | POA: Diagnosis not present

## 2021-04-17 DIAGNOSIS — N184 Chronic kidney disease, stage 4 (severe): Secondary | ICD-10-CM | POA: Diagnosis not present

## 2021-04-17 DIAGNOSIS — D508 Other iron deficiency anemias: Secondary | ICD-10-CM | POA: Diagnosis not present

## 2021-04-17 DIAGNOSIS — I129 Hypertensive chronic kidney disease with stage 1 through stage 4 chronic kidney disease, or unspecified chronic kidney disease: Secondary | ICD-10-CM | POA: Diagnosis not present

## 2021-04-17 DIAGNOSIS — N170 Acute kidney failure with tubular necrosis: Secondary | ICD-10-CM | POA: Diagnosis not present

## 2021-04-17 DIAGNOSIS — N200 Calculus of kidney: Secondary | ICD-10-CM | POA: Diagnosis not present

## 2021-04-17 DIAGNOSIS — E559 Vitamin D deficiency, unspecified: Secondary | ICD-10-CM | POA: Diagnosis not present

## 2021-04-17 DIAGNOSIS — D500 Iron deficiency anemia secondary to blood loss (chronic): Secondary | ICD-10-CM | POA: Diagnosis not present

## 2021-04-17 DIAGNOSIS — I5022 Chronic systolic (congestive) heart failure: Secondary | ICD-10-CM | POA: Diagnosis not present

## 2021-04-17 DIAGNOSIS — E8722 Chronic metabolic acidosis: Secondary | ICD-10-CM | POA: Diagnosis not present

## 2021-04-18 ENCOUNTER — Encounter
Payer: Medicare Other | Attending: Physical Medicine and Rehabilitation | Admitting: Physical Medicine and Rehabilitation

## 2021-04-18 ENCOUNTER — Other Ambulatory Visit: Payer: Self-pay

## 2021-04-18 VITALS — BP 149/86 | HR 68 | Temp 98.0°F | Ht 72.0 in | Wt 198.0 lb

## 2021-04-18 DIAGNOSIS — G4701 Insomnia due to medical condition: Secondary | ICD-10-CM | POA: Insufficient documentation

## 2021-04-18 DIAGNOSIS — M170 Bilateral primary osteoarthritis of knee: Secondary | ICD-10-CM | POA: Diagnosis not present

## 2021-04-23 ENCOUNTER — Encounter: Payer: Self-pay | Admitting: Internal Medicine

## 2021-04-23 ENCOUNTER — Ambulatory Visit (INDEPENDENT_AMBULATORY_CARE_PROVIDER_SITE_OTHER): Payer: Medicare Other | Admitting: Internal Medicine

## 2021-04-23 ENCOUNTER — Other Ambulatory Visit: Payer: Self-pay

## 2021-04-23 VITALS — BP 148/86 | HR 61 | Resp 17 | Ht 72.0 in | Wt 201.0 lb

## 2021-04-23 DIAGNOSIS — N184 Chronic kidney disease, stage 4 (severe): Secondary | ICD-10-CM

## 2021-04-23 DIAGNOSIS — I10 Essential (primary) hypertension: Secondary | ICD-10-CM

## 2021-04-23 DIAGNOSIS — I5022 Chronic systolic (congestive) heart failure: Secondary | ICD-10-CM

## 2021-04-23 DIAGNOSIS — R519 Headache, unspecified: Secondary | ICD-10-CM | POA: Diagnosis not present

## 2021-04-23 DIAGNOSIS — M170 Bilateral primary osteoarthritis of knee: Secondary | ICD-10-CM

## 2021-04-23 DIAGNOSIS — I2510 Atherosclerotic heart disease of native coronary artery without angina pectoris: Secondary | ICD-10-CM | POA: Diagnosis not present

## 2021-04-23 MED ORDER — OXYCODONE-ACETAMINOPHEN 10-325 MG PO TABS: 1.0000 | ORAL_TABLET | Freq: Four times a day (QID) | ORAL | 0 refills | Status: DC | PRN

## 2021-04-23 NOTE — Assessment & Plan Note (Signed)
Progressive CKD stage 4 Follows up with Nephrology On Sensipar and sodium bicarb tablets Avoid nephrotoxic agents, including NSAIDs

## 2021-04-23 NOTE — Assessment & Plan Note (Signed)
BP Readings from Last 1 Encounters:  04/23/21 (!) 148/86   Uncontrolled, followed by Nephrology Counseled for compliance with the medications Advised DASH diet and ambulation as tolerated

## 2021-04-23 NOTE — Assessment & Plan Note (Signed)
Has seen Orthopedic surgery, planning to get TKA Has had steroid injection in the past Unable to take NSAIDs due to CKD and CAD Followed by pain management/PMNR -was on hydrocodone in the past, requests a refill of Percocet as it worked better [employer] a short supply of Percocet for severe pain, discussed that I do not do chronic pain management and needs to discuss with pain clinic.

## 2021-04-23 NOTE — Patient Instructions (Signed)
Please take Oxycodone as needed for severe pain.  Please avoid taking Ibuprofen or Aleve for knee pain.  Please continue to follow up with your Orthopedic surgeon for knee arthritis.

## 2021-04-23 NOTE — Assessment & Plan Note (Signed)
Likely positional, as it has improved as he stopped using his new pillow Sinusitis can also present with headache Since it has improved now, no active management for now

## 2021-04-23 NOTE — Assessment & Plan Note (Signed)
Appears euvolemic On Coreg, Bidil and Lasix On Aspirin and statin for CAD F/u with Cardiology

## 2021-04-23 NOTE — Assessment & Plan Note (Signed)
H/o STEMI, s/p CABG On Aspirin, statin and B-blocker No active chest pain Followed by Cardiology

## 2021-04-23 NOTE — Progress Notes (Signed)
Acute Office Visit  Subjective:    Patient ID: [name], male    DOB: 04/03/1956, 65 y.o.   MRN: [id number]  Chief Complaint  Patient presents with   Pain    Pt states that he had pain in the top of his head since the last week of November.  Pain is now better. But pt still knows it is there.  Also pt states his legs are hurting from bone on bone knee pain.  Pt denies having diabetes.    HPI Patient is in today for c/o mild, intermittent headache about 2 weeks ago.  He reports that he had started using a new pillow before his headache appeared.  His headache has improved since he stopped using pillow.  Denies any nausea, watery eyes, runny nose, blurred vision or dizziness.  Of note, his BP was elevated in the office today.  He denies any chest pain, dyspnea or palpitations.  Followed by Cardiology for CAD.  He also has a history of CKD stage IV, followed by Dr. Wolfgang Phoenix.  Denies any dysuria or hematuria currently.  He complains of chronic bilateral knee pain, for which he has received steroid injections in the past.  He is going to get TKA with orthopedic surgeon, but requests refill of pain medicine for now.  He has been seeing Dr. Carlis Abbott for pain management.  Chart review suggest that he has tried Norco in the past, but states that oxycodone works better for him.  I agreed to provide a short supply of Percocet, but I had discussion with him that I do not do chronic pain management and to discuss it with pain clinic.  Past Medical History:  Diagnosis Date   Acute ST elevation myocardial infarction (STEMI) due to occlusion of mid portion of left anterior descending (LAD) coronary artery (HCC) 03/12/2019   Acute systolic heart failure (HCC)    Arthritis    CHF (congestive heart failure) (HCC)    CKD (chronic kidney disease) stage 4, GFR 15-29 ml/min (HCC)    Dyspnea    with exertionm periodically   Heart attack (HCC) 03/15/2019   anterior MI    History of kidney stones     Hyperparathyroidism (HCC)    Hypertension 2002   Ischemic cardiomyopathy    Left knee pain    Syncope 04/04/2019   after blood draw   Vitamin D deficiency     Past Surgical History:  Procedure Laterality Date   AMPUTATION TOE Left 1991   2 digit of left foot   BIOPSY  02/26/2021   Procedure: BIOPSY;  Surgeon: Malissa Hippo, MD;  Location: [facility];  Service: Endoscopy;;   CHOLECYSTECTOMY     COLONOSCOPY     COLONOSCOPY WITH PROPOFOL N/A 02/26/2021   Procedure: COLONOSCOPY WITH PROPOFOL;  Surgeon: Malissa Hippo, MD;  Location: [facility];  Service: Endoscopy;  Laterality: N/A;   CORONARY/GRAFT ACUTE MI REVASCULARIZATION N/A 03/12/2019   Procedure: Coronary/Graft Acute MI Revascularization;  Surgeon: Lyn Records, MD;  Location: [facility];  Service: Cardiovascular;  Laterality: N/A;   ESOPHAGOGASTRODUODENOSCOPY (EGD) WITH PROPOFOL N/A 02/26/2021   Procedure: ESOPHAGOGASTRODUODENOSCOPY (EGD) WITH PROPOFOL;  Surgeon: Malissa Hippo, MD;  Location: [facility];  Service: Endoscopy;  Laterality: N/A;   ICD IMPLANT N/A 08/24/2019   Procedure: ICD IMPLANT;  Surgeon: Marinus Maw, MD;  [facility]: [facility] LAB;  Service: Cardiovascular;  Laterality: N/A;   IR URETERAL STENT LEFT NEW ACCESS W/O  SEP NEPHROSTOMY CATH  09/26/2019   LEFT HEART CATH AND CORONARY ANGIOGRAPHY N/A 03/12/2019   Procedure: LEFT HEART CATH AND CORONARY ANGIOGRAPHY;  Surgeon: Belva Crome, MD;  Location: [place] [facility];  Service: Cardiovascular;  Laterality: N/A;   LITHOTRIPSY Right    NEPHROLITHOTOMY Left 09/26/2019   Procedure: LEFT NEPHROLITHOTOMY PERCUTANEOUS;  Surgeon: Irine Seal, MD;  Location: [facility];  Service: Urology;  Laterality: Left;    Family History  Problem Relation Age of Onset   Heart failure Mother    Heart disease Mother    Cancer Mother        breast   Heart attack Father    Hypertension Father    Heart disease Father    Diabetes Brother     Social  History   Socioeconomic History   Marital status: Single    Spouse name: Not on file   Number of children: 0   Years of education: Not on file   Highest education level: Some college, no degree  Occupational History   Occupation: retired  Tobacco Use   Smoking status: Former    Packs/day: 0.50    Years: 40.00    Pack years: 20.00    Types: Cigarettes    Quit date: 03/12/2019    Years since quitting: 2.1   Smokeless tobacco: Never  Vaping Use   Vaping Use: Never used  Substance and Sexual Activity   Alcohol use: No   Drug use: No   Sexual activity: Not Currently  Other Topics Concern   Not on file  Social History Narrative   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       Enjoys: fishing, shopping, working around the house       Diet: working on changes, avoiding fried foods, increased veggies   Caffeine: teas some daily   Water: 6-8 cups daily       Wears seat belt    Does not use phone while driving    Oceanographer at home    Social Determinants of Health   Financial Resource Strain: Low Risk    Difficulty of Paying Living Expenses: Not hard at all  Food Insecurity: No Food Insecurity   Worried About Charity fundraiser in the Last Year: Never true   Arboriculturist in the Last Year: Never true  Transportation Needs: No Transportation Needs   Lack of Transportation (Medical): No   Lack of Transportation (Non-Medical): No  Physical Activity: Insufficiently Active   Days of Exercise per Week: 7 days   Minutes of Exercise per Session: 10 min  Stress: No Stress Concern Present   Feeling of Stress : Not at all  Social Connections: Unknown   Frequency of Communication with Friends and Family: More than three times a week   Frequency of Social Gatherings with Friends and Family: More than three times a week   Attends Religious Services: More than 4 times per year   Active Member of [employer] or Organizations: No   Attends Music therapist: Never    Marital Status: Not on file  Intimate Partner Violence: Not At Risk   Fear of Current or Ex-Partner: No   Emotionally Abused: No   Physically Abused: No   Sexually Abused: No    Outpatient Medications Prior to Visit  Medication Sig Dispense Refill   allopurinol (ZYLOPRIM) 100 MG tablet Take 100 mg by mouth daily.      aspirin 81 MG chewable tablet  Chew 1 tablet (81 mg total) by mouth daily.     atorvastatin (LIPITOR) 80 MG tablet TAKE 1 TABLET BY MOUTH DAILY AT 6PM 90 tablet 0   BRILINTA 60 MG TABS tablet Take 1 tablet by mouth twice daily 180 tablet 3   carvedilol (COREG) 12.5 MG tablet TAKE 1 TABLET BY MOUTH TWICE DAILY WITH MEALS 180 tablet 0   cinacalcet (SENSIPAR) 30 MG tablet Take 30 mg by mouth daily.     colchicine 0.6 MG tablet Take 0.6 mg by mouth daily.     CORLANOR 5 MG TABS tablet TAKE 1 TABLET BY MOUTH TWICE DAILY WITH A MEAL 60 tablet 0   fluticasone (FLONASE) 50 MCG/ACT nasal spray Place 2 sprays into both nostrils daily. 16 g 6   furosemide (LASIX) 40 MG tablet Take 1 tablet (40 mg total) by mouth daily. (Patient taking differently: Take 40 mg by mouth as needed.) 30 tablet 11   nitroGLYCERIN (NITROSTAT) 0.4 MG SL tablet Place 1 tablet (0.4 mg total) under the tongue every 5 (five) minutes as needed. 25 tablet 2   pantoprazole (PROTONIX) 40 MG tablet Take 1 tablet (40 mg total) by mouth 2 (two) times daily before a meal. 60 tablet 5   potassium citrate (UROCIT-K) 10 MEQ (1080 MG) SR tablet Take 1 tablet by mouth daily.     oxyCODONE-acetaminophen (PERCOCET) 10-325 MG tablet Take 1 tablet by mouth every 6 (six) hours as needed for pain.     Alcohol Swabs (B-D SINGLE USE SWABS REGULAR) PADS USE AS DIRECTED (Patient not taking: Reported on 04/23/2021) 100 each 0   blood glucose meter kit and supplies Dispense based on patient and insurance preference. Use up to four times daily as directed. (FOR ICD-10 E10.9, E11.9). (Patient not taking: Reported on 04/01/2021) 1 each 0    TRUEplus Lancets 30G MISC Use as directed to check blood sugar daily. (Patient not taking: Reported on 04/01/2021) 100 each 1   No facility-administered medications prior to visit.    No Known Allergies  Review of Systems  Constitutional:  Negative for chills and fever.  HENT:  Negative for ear discharge and sore throat.   Eyes:  Negative for pain and discharge.  Respiratory:  Negative for cough and shortness of breath.   Cardiovascular:  Negative for chest pain and palpitations.  Gastrointestinal:  Negative for constipation, diarrhea, nausea and vomiting.  Endocrine: Negative for polydipsia and polyuria.  Genitourinary:  Negative for dysuria and hematuria.  Musculoskeletal:  Positive for arthralgias and back pain. Negative for neck pain and neck stiffness.  Skin:  Negative for rash.  Neurological:  Positive for headaches. Negative for dizziness, weakness and numbness.  Psychiatric/Behavioral:  Negative for agitation and behavioral problems.       Objective:    Physical Exam Vitals reviewed.  Constitutional:      General: He is not in acute distress.    Appearance: He is not diaphoretic.  HENT:     Head: Normocephalic and atraumatic.     Right Ear: There is no impacted cerumen.     Nose: Nose normal.     Mouth/Throat:     Mouth: Mucous membranes are moist.  Eyes:     General: No scleral icterus.    Extraocular Movements: Extraocular movements intact.  Cardiovascular:     Rate and Rhythm: Normal rate and regular rhythm.     Pulses: Normal pulses.     Heart sounds: Normal heart sounds. No murmur heard. Pulmonary:  Breath sounds: Normal breath sounds. No wheezing or rales.  Abdominal:     Palpations: Abdomen is soft.     Tenderness: There is no abdominal tenderness.  Musculoskeletal:        General: Tenderness (B/l knee, with mild swelling) present.     Cervical back: Neck supple. No tenderness.     Right lower leg: No edema.     Left lower leg: No edema.  Skin:     General: Skin is warm.     Findings: No rash.  Neurological:     General: No focal deficit present.     Mental Status: He is alert and oriented to person, place, and time.     Sensory: No sensory deficit.     Motor: No weakness.  Psychiatric:        Mood and Affect: Mood normal.        Behavior: Behavior normal.    BP (!) 148/86    Pulse 61    Resp 17    Ht 6' (1.829 m)    Wt 201 lb 0.6 oz (91.2 kg)    SpO2 99%    BMI 27.27 kg/m  Wt Readings from Last 3 Encounters:  04/23/21 201 lb 0.6 oz (91.2 kg)  04/18/21 198 lb (89.8 kg)  03/18/21 191 lb (86.6 kg)        Assessment & Plan:   Problem List Items Addressed This Visit       Cardiovascular and Mediastinum   Essential hypertension    BP Readings from Last 1 Encounters:  04/23/21 (!) 148/86  Uncontrolled, followed by Nephrology Counseled for compliance with the medications Advised DASH diet and ambulation as tolerated      Congestive heart failure ([place])    Appears euvolemic On Coreg, Bidil and Lasix On Aspirin and statin for CAD F/u with Cardiology      CAD in native artery    H/o STEMI, s/p CABG On Aspirin, statin and B-blocker No active chest pain Followed by Cardiology        Musculoskeletal and Integument   Primary osteoarthritis of both knees - Primary    Has seen Orthopedic surgery, planning to get TKA Has had steroid injection in the past Unable to take NSAIDs due to CKD and CAD Followed by pain management/PMNR -was on hydrocodone in the past, requests a refill of Percocet as it worked better [employer] a short supply of Percocet for severe pain, discussed that I do not do chronic pain management and needs to discuss with pain clinic.      Relevant Medications   oxyCODONE-acetaminophen (PERCOCET) 10-325 MG tablet     Genitourinary   Chronic kidney disease, stage IV (severe) (HCC)    Progressive CKD stage 4 Follows up with Nephrology On Sensipar and sodium bicarb tablets Avoid nephrotoxic agents,  including NSAIDs        Other   Nonintractable episodic headache    Likely positional, as it has improved as he stopped using his new pillow Sinusitis can also present with headache Since it has improved now, no active management for now      Relevant Medications   oxyCODONE-acetaminophen (PERCOCET) 10-325 MG tablet     Meds ordered this encounter  Medications   oxyCODONE-acetaminophen (PERCOCET) 10-325 MG tablet    Sig: Take 1 tablet by mouth every 6 (six) hours as needed for pain.    Dispense:  30 tablet    Refill:  0     Pariss Hommes Keith Rake,  MD

## 2021-05-07 ENCOUNTER — Encounter (HOSPITAL_COMMUNITY): Payer: Self-pay

## 2021-05-07 ENCOUNTER — Encounter (HOSPITAL_COMMUNITY)
Admission: RE | Admit: 2021-05-07 | Discharge: 2021-05-07 | Disposition: A | Discharge status: Home / Self Care | Payer: Medicare Other | Source: Ambulatory Visit | Attending: Nephrology | Admitting: Nephrology

## 2021-05-07 DIAGNOSIS — D509 Iron deficiency anemia, unspecified: Secondary | ICD-10-CM | POA: Diagnosis not present

## 2021-05-07 MED ORDER — SODIUM CHLORIDE 0.9 % IV SOLN: INTRAVENOUS | Status: DC

## 2021-05-07 MED ORDER — SODIUM CHLORIDE 0.9 % IV SOLN
510.0000 mg | Freq: Once | INTRAVENOUS | Status: AC
  Administered 2021-05-07: 510 mg via INTRAVENOUS
  Filled 2021-05-07: qty 17

## 2021-05-14 ENCOUNTER — Encounter (HOSPITAL_COMMUNITY)
Admission: RE | Admit: 2021-05-14 | Discharge: 2021-05-14 | Disposition: A | Discharge status: Home / Self Care | Payer: Medicare Other | Source: Ambulatory Visit | Attending: Nephrology | Admitting: Nephrology

## 2021-05-14 ENCOUNTER — Encounter (HOSPITAL_COMMUNITY): Payer: Self-pay

## 2021-05-14 ENCOUNTER — Other Ambulatory Visit: Payer: Self-pay

## 2021-05-14 DIAGNOSIS — D509 Iron deficiency anemia, unspecified: Secondary | ICD-10-CM | POA: Diagnosis not present

## 2021-05-14 MED ORDER — SODIUM CHLORIDE 0.9 % IV SOLN: Freq: Once | INTRAVENOUS | Status: AC

## 2021-05-14 MED ORDER — SODIUM CHLORIDE 0.9 % IV SOLN
510.0000 mg | Freq: Once | INTRAVENOUS | Status: AC
  Administered 2021-05-14: 510 mg via INTRAVENOUS
  Filled 2021-05-14: qty 17

## 2021-05-16 ENCOUNTER — Other Ambulatory Visit: Payer: Self-pay | Admitting: Internal Medicine

## 2021-05-16 ENCOUNTER — Telehealth: Payer: Self-pay | Admitting: Internal Medicine

## 2021-05-16 DIAGNOSIS — M170 Bilateral primary osteoarthritis of knee: Secondary | ICD-10-CM

## 2021-05-16 MED ORDER — OXYCODONE-ACETAMINOPHEN 10-325 MG PO TABS: 1.0000 | ORAL_TABLET | Freq: Four times a day (QID) | ORAL | 0 refills | Status: DC | PRN

## 2021-05-16 NOTE — Telephone Encounter (Signed)
Please call in Oxycodone [phone]

## 2021-05-16 NOTE — Telephone Encounter (Signed)
He is requesting refill of oxycodone but this was in the last OV note:  Followed by pain management/PMNR -was on hydrocodone in the past, requests a refill of Percocet as it worked better [employer] a short supply of Percocet for severe pain, discussed that I do not do chronic pain management and needs to discuss with pain clinic.

## 2021-05-22 LAB — CUP PACEART REMOTE DEVICE CHECK
Battery Remaining Longevity: 174 mo
Battery Remaining Percentage: 100 %
Brady Statistic RV Percent Paced: 0 %
Date Time Interrogation Session: 20230119040100
HighPow Impedance: 76 Ohm
Implantable Lead Implant Date: 20210422
Implantable Lead Location: 753860
Implantable Lead Model: 138
Implantable Lead Serial Number: 302700
Implantable Pulse Generator Implant Date: 20210422
Lead Channel Impedance Value: 446 Ohm
Lead Channel Setting Pacing Amplitude: 2.5 V
Lead Channel Setting Pacing Pulse Width: 0.4 ms
Lead Channel Setting Sensing Sensitivity: 0.5 mV
Pulse Gen Serial Number: 209495

## 2021-05-23 ENCOUNTER — Ambulatory Visit (INDEPENDENT_AMBULATORY_CARE_PROVIDER_SITE_OTHER): Payer: Medicare Other

## 2021-05-23 DIAGNOSIS — I255 Ischemic cardiomyopathy: Secondary | ICD-10-CM | POA: Diagnosis not present

## 2021-05-27 ENCOUNTER — Other Ambulatory Visit: Payer: Self-pay | Admitting: Internal Medicine

## 2021-05-27 ENCOUNTER — Telehealth: Payer: Self-pay | Admitting: Internal Medicine

## 2021-05-27 ENCOUNTER — Encounter: Payer: Self-pay | Admitting: Internal Medicine

## 2021-05-27 ENCOUNTER — Telehealth (HOSPITAL_COMMUNITY): Payer: Self-pay | Admitting: Vascular Surgery

## 2021-05-27 DIAGNOSIS — I5022 Chronic systolic (congestive) heart failure: Secondary | ICD-10-CM | POA: Diagnosis not present

## 2021-05-27 DIAGNOSIS — E559 Vitamin D deficiency, unspecified: Secondary | ICD-10-CM | POA: Diagnosis not present

## 2021-05-27 DIAGNOSIS — I129 Hypertensive chronic kidney disease with stage 1 through stage 4 chronic kidney disease, or unspecified chronic kidney disease: Secondary | ICD-10-CM | POA: Diagnosis not present

## 2021-05-27 DIAGNOSIS — M170 Bilateral primary osteoarthritis of knee: Secondary | ICD-10-CM

## 2021-05-27 DIAGNOSIS — N184 Chronic kidney disease, stage 4 (severe): Secondary | ICD-10-CM | POA: Diagnosis not present

## 2021-05-27 DIAGNOSIS — E210 Primary hyperparathyroidism: Secondary | ICD-10-CM | POA: Diagnosis not present

## 2021-05-27 DIAGNOSIS — N170 Acute kidney failure with tubular necrosis: Secondary | ICD-10-CM | POA: Diagnosis not present

## 2021-05-27 DIAGNOSIS — D638 Anemia in other chronic diseases classified elsewhere: Secondary | ICD-10-CM | POA: Diagnosis not present

## 2021-05-27 DIAGNOSIS — E8722 Chronic metabolic acidosis: Secondary | ICD-10-CM | POA: Diagnosis not present

## 2021-05-27 NOTE — Telephone Encounter (Signed)
Please call the pt and advise them when they can get there pain pills

## 2021-05-27 NOTE — Telephone Encounter (Signed)
Pain medication was just submitted 05-16-21 for one month please let him know

## 2021-05-27 NOTE — Telephone Encounter (Signed)
Returned pt call left on scheduling line, lvm to call back

## 2021-05-28 ENCOUNTER — Other Ambulatory Visit: Payer: Self-pay | Admitting: Internal Medicine

## 2021-05-28 DIAGNOSIS — M170 Bilateral primary osteoarthritis of knee: Secondary | ICD-10-CM

## 2021-05-28 MED ORDER — OXYCODONE-ACETAMINOPHEN 10-325 MG PO TABS: 1.0000 | ORAL_TABLET | Freq: Three times a day (TID) | ORAL | 0 refills | Status: DC | PRN

## 2021-05-28 NOTE — Telephone Encounter (Signed)
Patient called and said he has had a heart attack in the past and the oxycodone is the only thing that helps the knee pain bone on bone pain, needs pain medicine until he gets his knee surgery.  This is the only medicine that helps the patient with his pain. Patient asked if nurse will please return his call at [phone].

## 2021-05-28 NOTE — Telephone Encounter (Signed)
Pt takes this for severe pain advised pt this should be as needed for pain he said he has about 12 left per provider will send in 1 refill and let pt know

## 2021-06-03 ENCOUNTER — Other Ambulatory Visit (HOSPITAL_COMMUNITY): Payer: Self-pay | Admitting: Internal Medicine

## 2021-06-03 ENCOUNTER — Other Ambulatory Visit: Payer: Self-pay | Admitting: Internal Medicine

## 2021-06-03 ENCOUNTER — Ambulatory Visit (INDEPENDENT_AMBULATORY_CARE_PROVIDER_SITE_OTHER): Payer: Medicare Other | Admitting: *Deleted

## 2021-06-03 DIAGNOSIS — I5022 Chronic systolic (congestive) heart failure: Secondary | ICD-10-CM

## 2021-06-03 DIAGNOSIS — I2510 Atherosclerotic heart disease of native coronary artery without angina pectoris: Secondary | ICD-10-CM

## 2021-06-03 NOTE — Chronic Care Management (AMB) (Signed)
Chronic Care Management   CCM RN Visit Note  06/03/2021 Name: [name] MRN: [id number] DOB: 1955-07-14  Subjective: [name] is a 66 y.o. year old male who is a primary care patient of Lindell Spar, MD. The care management team was consulted for assistance with disease management and care coordination needs.    Engaged with patient by telephone for follow up visit in response to provider referral for case management and/or care coordination services.   Consent to Services:  The patient was given information about Chronic Care Management services, agreed to services, and gave verbal consent prior to initiation of services.  Please see initial visit note for detailed documentation.   Patient agreed to services and verbal consent obtained.   Assessment: Review of patient past medical history, allergies, medications, health status, including review of consultants reports, laboratory and other test data, was performed as part of comprehensive evaluation and provision of chronic care management services.   SDOH (Social Determinants of Health) assessments and interventions performed:    CCM Care Plan  No Known Allergies  Outpatient Encounter Medications as of 06/03/2021  Medication Sig   allopurinol (ZYLOPRIM) 100 MG tablet Take 100 mg by mouth daily.    aspirin 81 MG chewable tablet Chew 1 tablet (81 mg total) by mouth daily.   atorvastatin (LIPITOR) 80 MG tablet TAKE 1 TABLET BY MOUTH DAILY AT 6PM   BRILINTA 60 MG TABS tablet Take 1 tablet by mouth twice daily   carvedilol (COREG) 12.5 MG tablet TAKE 1 TABLET BY MOUTH TWICE DAILY WITH MEALS   cinacalcet (SENSIPAR) 30 MG tablet Take 30 mg by mouth daily.   colchicine 0.6 MG tablet Take 0.6 mg by mouth daily.   CORLANOR 5 MG TABS tablet TAKE 1 TABLET BY MOUTH TWICE DAILY WITH A MEAL   fluticasone (FLONASE) 50 MCG/ACT nasal spray Place 2 sprays into both nostrils daily.   furosemide (LASIX) 40 MG tablet Take 1 tablet (40  mg total) by mouth daily. (Patient taking differently: Take 40 mg by mouth as needed.)   nitroGLYCERIN (NITROSTAT) 0.4 MG SL tablet Place 1 tablet (0.4 mg total) under the tongue every 5 (five) minutes as needed.   oxyCODONE-acetaminophen (PERCOCET) 10-325 MG tablet Take 1 tablet by mouth every 8 (eight) hours as needed for pain.   pantoprazole (PROTONIX) 40 MG tablet Take 1 tablet (40 mg total) by mouth 2 (two) times daily before a meal.   potassium citrate (UROCIT-K) 10 MEQ (1080 MG) SR tablet Take 1 tablet by mouth daily.   Alcohol Swabs (B-D SINGLE USE SWABS REGULAR) PADS USE AS DIRECTED (Patient not taking: Reported on 04/23/2021)   blood glucose meter kit and supplies Dispense based on patient and insurance preference. Use up to four times daily as directed. (FOR ICD-10 E10.9, E11.9). (Patient not taking: Reported on 04/01/2021)   TRUEplus Lancets 30G MISC Use as directed to check blood sugar daily. (Patient not taking: Reported on 04/01/2021)   No facility-administered encounter medications on file as of 06/03/2021.    Patient Active Problem List   Diagnosis Date Noted   Nonintractable episodic headache 04/23/2021   Primary osteoarthritis of both knees 04/23/2021   Anemia due to chronic blood loss 03/05/2021   Gastric ulcer 03/05/2021   Type 2 diabetes with nephropathy (HCC)    Acute anemia 02/25/2021   LUQ pain    Symptomatic anemia 02/22/2021   Prediabetes 02/22/2021   SOB (shortness of breath)    Left renal stone 09/26/2019  CAD in native artery    Ischemic cardiomyopathy 08/15/2019   Congestive heart failure ([place]) 03/21/2019   ST elevation myocardial infarction involving left anterior descending (LAD) coronary artery (HCC)    Chronic kidney disease, stage IV (severe) ([place]) 06/11/2016   Elevated PSA 06/11/2016   Hyperlipidemia 06/11/2016   Essential hypertension 05/28/2016    Conditions to be addressed/monitored:CHF and CAD  Care Plan : RN Care Manager plan of care   Updates made by Kassie Mends, RN since 06/03/2021 12:00 AM     Problem: No plan of care established for management of chronic disease states (CHF, CAD, CKD stage 4, HLD, Anemia)   Priority: High     Long-Range Goal: Development of plan of care for chronic disease management (CHF, CAD, CKD stage 4, HLD, Anemia)   Start Date: 04/01/2021  Expected End Date: 09/28/2021  Note:   Current Barriers:  Knowledge Deficits related to plan of care for management of CHF and CAD - Patient can benefit from teaching/ reinforcement of CHF action plan, diet. Patient reports he and elderly mother live together and he is her primary caregiver, has 2 brothers that he can call on for assistance if needed. Patient reports he does weigh daily, reports he does not have diabetes and does not check CBG and is not on medication. Hospitalization 02/22/21 for Anemia- chronic blood loss, reports feeling better since this hospitalization, pt reports he walks daily, is independent with ADL, IADL's, continues to drive, reports has all medications and taking as prescribed.  RNCM Clinical Goal(s):  Patient will verbalize understanding of plan for management of CHF and CAD as evidenced by patient report, review of EHR and  through collaboration with RN Care manager, provider, and care team.   Interventions: 1:1 collaboration with primary care provider regarding development and update of comprehensive plan of care as evidenced by provider attestation and co-signature Inter-disciplinary care team collaboration (see longitudinal plan of care) Evaluation of current treatment plan related to  self management and patient's adherence to plan as established by provider  CAD Interventions: (Status:  New goal.) Long Term Goal Assessed understanding of CAD diagnosis Medications reviewed including medications utilized in CAD treatment plan Provided education on importance of blood pressure control in management of CAD Provided  education on Importance of limiting foods high in cholesterol Counseled on the importance of exercise goals with target of 150 minutes per week Reviewed Importance of taking all medications as prescribed Reviewed Importance of attending all scheduled provider appointments  Heart Failure Interventions:  (Status:  New goal.) Long Term Goal Provided education on low sodium diet Discussed importance of daily weight and advised patient to weigh and record daily Reviewed role of diuretics in prevention of fluid overload and management of heart failure; Provided patient with education about the role of exercise in the management of heart failure Reviewed upcoming scheduled appointments  Patient Goals/Self-Care Activities: Take all medications as prescribed Attend all scheduled provider appointments Perform all self care activities independently  Perform IADL's (shopping, preparing meals, housekeeping, managing finances) independently Call provider office for new concerns or questions  follow rescue plan if symptoms flare-up dress right for the weather, hot or cold Continue to weigh daily and record Follow heart failure action plan for weight gain over 3 pounds in one day or 5 pounds in one week Follow low sodium diet- avoid fast food and salty snacks Try to do some type of exercise daily- walking is good  Follow Up Plan:  Telephone follow up  appointment with care management team member scheduled for:  07/29/2020       Plan:Telephone follow up appointment with care management team member scheduled for:  07/29/21  Jacqlyn Larsen [facility], BSN RN Case Manager [facility] [phone]

## 2021-06-03 NOTE — Telephone Encounter (Signed)
This is a [place] pt.  °

## 2021-06-03 NOTE — Patient Instructions (Signed)
Visit Information  Thank you for taking time to visit with me today. Please don't hesitate to contact me if I can be of assistance to you before our next scheduled telephone appointment.  Following are the goals we discussed today:  Take all medications as prescribed Attend all scheduled provider appointments Perform all self care activities independently  Perform IADL's (shopping, preparing meals, housekeeping, managing finances) independently Call provider office for new concerns or questions  follow rescue plan if symptoms flare-up dress right for the weather, hot or cold Continue to weigh daily and record Follow heart failure action plan for weight gain over 3 pounds in one day or 5 pounds in one week Follow low sodium diet- avoid fast food and salty snacks Try to do some type of exercise daily- walking is good  Our next appointment is by telephone on 07/29/21 at 1045 am  Please call the care guide team at [phone] if you need to cancel or reschedule your appointment.   If you are experiencing a Mental Health or [place] or need someone to talk to, please call 1-800-273-TALK (toll free, 24 hour hotline) go to [facility] [address] [place] [phone]) call the [place]: [phone] call 911   The patient verbalized understanding of instructions, educational materials, and care plan provided today and declined offer to receive copy of patient instructions, educational materials, and care plan.   Jacqlyn Larsen [facility], BSN RN Case Manager [facility] [phone]

## 2021-06-04 ENCOUNTER — Telehealth (HOSPITAL_COMMUNITY): Payer: Self-pay | Admitting: Cardiology

## 2021-06-04 MED ORDER — CARVEDILOL 12.5 MG PO TABS: 12.5000 mg | ORAL_TABLET | Freq: Two times a day (BID) | ORAL | 3 refills | Status: DC

## 2021-06-04 NOTE — Progress Notes (Signed)
Remote ICD transmission.   

## 2021-06-04 NOTE — Telephone Encounter (Signed)
done

## 2021-06-04 NOTE — Telephone Encounter (Signed)
Pt request Carvedilol refill, pharmacist faxed request from [facility], [place], pt stated he is out of meds, please advise

## 2021-06-05 ENCOUNTER — Ambulatory Visit: Payer: Medicare Other | Admitting: Urology

## 2021-06-10 ENCOUNTER — Encounter: Payer: Self-pay | Admitting: Physical Medicine and Rehabilitation

## 2021-06-10 ENCOUNTER — Encounter
Payer: Medicare Other | Attending: Physical Medicine and Rehabilitation | Admitting: Physical Medicine and Rehabilitation

## 2021-06-10 ENCOUNTER — Other Ambulatory Visit: Payer: Self-pay

## 2021-06-10 VITALS — BP 144/88 | HR 62 | Ht 72.0 in | Wt 204.0 lb

## 2021-06-10 DIAGNOSIS — G8929 Other chronic pain: Secondary | ICD-10-CM | POA: Diagnosis not present

## 2021-06-10 DIAGNOSIS — M25561 Pain in right knee: Secondary | ICD-10-CM | POA: Diagnosis not present

## 2021-06-10 DIAGNOSIS — M25562 Pain in left knee: Secondary | ICD-10-CM | POA: Insufficient documentation

## 2021-06-10 NOTE — Progress Notes (Signed)
Subjective:     Patient ID: [name], male   DOB: 1955/06/01, 66 y.o.   MRN: [id number]  HPI  [name] is a 66 year old man who presents for follow-up of severe bilateral knee OA.   -the oxycodone was the only thing that helped. He says he has been clean of any recreational drugs. He asks whether we can give him another chance to prescribe this for him. Discussed unfortunately for him that our clinic rules do not permit prescription of controlled medications when there has been cocaine present in urine sample. He is not sleeping well due to his pain. Discussed trying amitriptyline at night for pain and insomnia but I would like him to clear this with his cardiologist first.    -He has bilateral knee pain, 10/10, that is sharp, stabbing, and aching.  -he is ready to try Qutenza today -he is still frustrated that we cannot prescribe any opioids for him- his PCP did prescribe them for a short term -he asks why he can't have a second chance  -He used to walk a lot in his work.   -He has had injections in his bilateral knees with steroid and Monovisc without benefit. He has never tried oral steroids.   -He has tried Ibuprofen and Meloxicam without benefit. Discussed retrying this option with his cardiologist Dr. Haroldine Laws, but it is contraindicated given his stage 2 CKD  -Fortunately he was recently cleared to get a left knee replacement by Dr. Haroldine Laws at his appointment yesterday- wears a Lifevest and has ICD- has been doing well in this regard.   As per patient he was referred here by Dr. Ninfa Linden as [name] is no longer providing benefit and Dr. Ninfa Linden felt Oxycodone may be warranted and should be managed by pain clinic.   Pain Inventory Average Pain 9 Pain Right Now 9 My pain is constant, sharp, and tingling  In the last 24 hours, has pain interfered with the following? General activity 8 Relation with others 8 Enjoyment of life 8 What TIME of day is your pain at its  worst? morning  and night Sleep (in general) Poor  Pain is worse with: walking and sitting Pain improves with: medication Relief from Meds: 10        Family History  Problem Relation Age of Onset   Heart failure Mother    Heart disease Mother    Cancer Mother        breast   Heart attack Father    Hypertension Father    Heart disease Father    Diabetes Brother    Social History   Socioeconomic History   Marital status: Single    Spouse name: Not on file   Number of children: 0   Years of education: Not on file   Highest education level: Some college, no degree  Occupational History   Occupation: retired  Tobacco Use   Smoking status: Former    Packs/day: 0.50    Years: 40.00    Pack years: 20.00    Types: Cigarettes    Quit date: 03/12/2019    Years since quitting: 2.2   Smokeless tobacco: Never  Vaping Use   Vaping Use: Never used  Substance and Sexual Activity   Alcohol use: No   Drug use: No   Sexual activity: Not Currently  Other Topics Concern   Not on file  Social History Narrative   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  Enjoys: fishing, shopping, working around the house       Diet: working on changes, avoiding fried foods, increased veggies   Caffeine: teas some daily   Water: 6-8 cups daily       Wears seat belt    Does not use phone while driving    Oceanographer at home    Social Determinants of Radio broadcast assistant Strain: Low Risk    Difficulty of Paying Living Expenses: Not hard at all  Food Insecurity: No Food Insecurity   Worried About Charity fundraiser in the Last Year: Never true   Arboriculturist in the Last Year: Never true  Transportation Needs: No Transportation Needs   Lack of Transportation (Medical): No   Lack of Transportation (Non-Medical): No  Physical Activity: Insufficiently Active   Days of Exercise per Week: 7 days   Minutes of Exercise per Session: 10 min  Stress: No Stress Concern Present    Feeling of Stress : Not at all  Social Connections: Unknown   Frequency of Communication with Friends and Family: More than three times a week   Frequency of Social Gatherings with Friends and Family: More than three times a week   Attends Religious Services: More than 4 times per year   Active Member of Clubs or Organizations: No   Attends Archivist Meetings: Never   Marital Status: Not on file   Past Surgical History:  Procedure Laterality Date   AMPUTATION TOE Left 1991   2 digit of left foot   BIOPSY  02/26/2021   Procedure: BIOPSY;  Surgeon: Rogene Houston, MD;  Location: [facility];  Service: Endoscopy;;   CHOLECYSTECTOMY     COLONOSCOPY     COLONOSCOPY WITH PROPOFOL N/A 02/26/2021   Procedure: COLONOSCOPY WITH PROPOFOL;  Surgeon: Rogene Houston, MD;  Location: [facility];  Service: Endoscopy;  Laterality: N/A;   CORONARY/GRAFT ACUTE MI REVASCULARIZATION N/A 03/12/2019   Procedure: Coronary/Graft Acute MI Revascularization;  Surgeon: Belva Crome, MD;  Location: [place];  Service: Cardiovascular;  Laterality: N/A;   ESOPHAGOGASTRODUODENOSCOPY (EGD) WITH PROPOFOL N/A 02/26/2021   Procedure: ESOPHAGOGASTRODUODENOSCOPY (EGD) WITH PROPOFOL;  Surgeon: Rogene Houston, MD;  Location: [facility];  Service: Endoscopy;  Laterality: N/A;   ICD IMPLANT N/A 08/24/2019   Procedure: ICD IMPLANT;  Surgeon: Evans Lance, MD;  Location: [place] [facility];  Service: Cardiovascular;  Laterality: N/A;   IR URETERAL STENT LEFT NEW ACCESS W/O SEP NEPHROSTOMY CATH  09/26/2019   LEFT HEART CATH AND CORONARY ANGIOGRAPHY N/A 03/12/2019   Procedure: LEFT HEART CATH AND CORONARY ANGIOGRAPHY;  Surgeon: Belva Crome, MD;  Location: [facility];  Service: Cardiovascular;  Laterality: N/A;   LITHOTRIPSY Right    NEPHROLITHOTOMY Left 09/26/2019   Procedure: LEFT NEPHROLITHOTOMY PERCUTANEOUS;  Surgeon: Irine Seal, MD;  Location: [facility];  Service: Urology;   Laterality: Left;   Past Medical History:  Diagnosis Date   Acute ST elevation myocardial infarction (STEMI) due to occlusion of mid portion of left anterior descending (LAD) coronary artery ([place]) 51/11/15   Acute systolic heart failure (HCC)    Arthritis    CHF (congestive heart failure) (HCC)    CKD (chronic kidney disease) stage 4, GFR 15-29 ml/min (HCC)    Dyspnea    with exertionm periodically   Heart attack ([place]) 03/15/2019   anterior MI    History of kidney stones  Hyperparathyroidism ([place])    Hypertension 2002   Ischemic cardiomyopathy    Left knee pain    Syncope 04/04/2019   after blood draw   Vitamin D deficiency    BP (!) 144/88    Pulse 62    Ht 6' (1.829 m)    Wt 204 lb (92.5 kg)    SpO2 97%    BMI 27.67 kg/m   Opioid Risk Score:   Fall Risk Score:  `1  Depression screen PHQ 2/9  Depression screen [facility] 2/9 06/10/2021 04/23/2021 04/01/2021 12/31/2020 08/28/2020 05/16/2020 12/27/2019  Decreased Interest 0 0 0 0 0 0 0  Down, Depressed, Hopeless 0 0 0 0 0 0 0  PHQ - 2 Score 0 0 0 0 0 0 0  Altered sleeping - - - - - 0 -  Tired, decreased energy - - - - - 0 -  Change in appetite - - - - - 0 -  Feeling bad or failure about yourself  - - - - - 0 -  Trouble concentrating - - - - - 0 -  Moving slowly or fidgety/restless - - - - - 0 -  Suicidal thoughts - - - - - 0 -  PHQ-9 Score - - - - - 0 -  Some recent data might be hidden    Review of Systems  Constitutional: Negative.   HENT: Negative.    Eyes: Negative.   Respiratory: Negative.    Cardiovascular: Negative.   Gastrointestinal: Negative.   Endocrine: Negative.   Genitourinary: Negative.   Musculoskeletal:  Positive for arthralgias, back pain and gait problem.       Pain in both knees  Skin: Negative.   Allergic/Immunologic: Negative.   Hematological: Negative.   Psychiatric/Behavioral: Negative.    All other systems reviewed and are negative.     Objective:   Physical Exam Gen: no distress, normal  appearing, BP 149/86, BMI 26.85 HEENT: oral mucosa pink and moist, NCAT Cardio: Reg rate Chest: normal effort, normal rate of breathing Abd: soft, non-distended Ext: no edema Psych: pleasant, normal affect Skin: dry Neuro: Alert and oriented x3. Ambulating in office and outside with antalgic gait without AD. Tender to palpation in bilateral knee joint medial to lateral with bony protrusions. +genu valgum, left>right.    Assessment:         Plan:     [name] is 23 man who presents for follow-up of bilateral knee OA.  1) Chronic Pain Syndrome secondary to bilateral knee OA -Discussed current symptoms of pain and history of pain.  -Provided with a pain relief journal and discussed that it contains foods and lifestyle tips to naturally help to improve pain. Discussed that these lifestyle strategies are also very good for health unlike some medications which can have negative side effects. Discussed that the act of keeping a journal can be therapeutic and helpful to realize patterns what helps to trigger and alleviate pain.   -Discussed Qutenza as an option for neuropathic pain control. Discussed that this is a capsaicin patch, stronger than capsaicin cream. Discussed that it is currently approved for diabetic peripheral neuropathy and post-herpetic neuralgia, but that it has also shown benefit in treating other forms of neuropathy. Provided patient with link to site to learn more about the patch: [employer].fr. Discussed that the patch would be placed in office and benefits usually last 3 months. Discussed that unintended exposure to capsaicin can cause severe irritation of eyes, mucous membranes, respiratory tract, and  skin, but that Qutenza is a local treatment and does not have the systemic side effects of other nerve medications. Discussed that there may be pain, itching, erythema, and decreased sensory function associated with the application of Qutenza. Side effects  usually subside within 1 week. A cold pack of analgesic medications can help with these side effects. Blood pressure can also be increased due to pain associated with administration of the patch.  1 patch of Qutenza was applied to the area of pain. Ice packs were applied during the procedure to ensure patient comfort. Blood pressure was monitored every 15 minutes. The patient tolerated the procedure well. Post-procedure instructions were given and follow-up has been scheduled.   Recommended [employer] -referred to [facility] for second opinion -Discussed benefits of exercise in reducing pain. -Discussed following foods that may reduce pain: 1) Ginger (especially studied for arthritis)- reduce leukotriene production to decrease inflammation 2) Blueberries- high in phytonutrients that decrease inflammation 3) Salmon- marine omega-3s reduce joint swelling and pain 4) Pumpkin seeds- reduce inflammation 5) dark chocolate- reduces inflammation 6) turmeric- reduces inflammation 7) tart cherries - reduce pain and stiffness 8) extra virgin olive oil - its compound olecanthal helps to block prostaglandins  9) chili peppers- can be eaten or applied topically via capsaicin 10) mint- helpful for headache, muscle aches, joint pain, and itching 11) garlic- reduces inflammation  Link to further information on diet for chronic pain: http://www.randall.com/    -XRs reviewed and show moderate tricompartmental arthritis.  -UDS obtained previously and is positive for cocaine. He denies cocaine use, but we cannot prescribe controlled substances in this case. Discussed with him and offered referral to [facility] and he is agreeable. He would like to continue following here as well.  -Discussed that opioids have significant side effects of tolerance and dependence as well and are not intended for long-term treatment.  -Plan  for steroid injection next visit. If he is able to get a surgical date in the near future, will cancel steroid injection.  Turmeric to reduce inflammation--can be used in cooking or taken as a supplement.  Benefits of turmeric:  -Highly anti-inflammatory  -Increases antioxidants  -Improves memory, attention, brain disease  -Lowers risk of heart disease  -May help prevent cancer  -Decreases pain  -Alleviates depression  -Delays aging and decreases risk of chronic disease  -Consume with black pepper to increase absorption    Turmeric Milk Recipe:  1 cup milk  1 tsp turmeric  1 tsp cinnamon  1 tsp grated ginger (optional)  Black pepper (boosts the anti-inflammatory properties of turmeric).  1 tsp honey  2) Impaired mobility and ADLs -Provided with handicap placard given antalgic gait and limited mobility  -Provided with script for a single-point cane.   3) stage 4 CKD: -discussed with Dr. Haroldine Laws, advised patient that Meloxicam is contraindicated given stage 4 CKD  4) insomnia -recommended clearing amitriptyline with his cardiologist.

## 2021-06-12 ENCOUNTER — Ambulatory Visit (INDEPENDENT_AMBULATORY_CARE_PROVIDER_SITE_OTHER): Payer: Medicare Other | Admitting: "Endocrinology

## 2021-06-12 ENCOUNTER — Other Ambulatory Visit: Payer: Self-pay | Admitting: Internal Medicine

## 2021-06-12 ENCOUNTER — Encounter: Payer: Self-pay | Admitting: "Endocrinology

## 2021-06-12 ENCOUNTER — Other Ambulatory Visit: Payer: Self-pay

## 2021-06-12 ENCOUNTER — Telehealth: Payer: Self-pay

## 2021-06-12 VITALS — BP 124/84 | HR 68 | Ht 72.0 in | Wt 207.4 lb

## 2021-06-12 DIAGNOSIS — E212 Other hyperparathyroidism: Secondary | ICD-10-CM | POA: Diagnosis not present

## 2021-06-12 DIAGNOSIS — E559 Vitamin D deficiency, unspecified: Secondary | ICD-10-CM | POA: Diagnosis not present

## 2021-06-12 DIAGNOSIS — M170 Bilateral primary osteoarthritis of knee: Secondary | ICD-10-CM

## 2021-06-12 MED ORDER — OXYCODONE-ACETAMINOPHEN 10-325 MG PO TABS: 1.0000 | ORAL_TABLET | Freq: Three times a day (TID) | ORAL | 0 refills | Status: DC | PRN

## 2021-06-12 NOTE — Telephone Encounter (Signed)
Patient called need med refill  oxyCODONE-acetaminophen (PERCOCET) 10-325 MG tablet   Pharmacy: [facility]

## 2021-06-12 NOTE — Progress Notes (Signed)
Endocrinology Consult Note                                            06/12/2021, 11:44 AM   Subjective:    Patient ID: [name], male    DOB: 08/28/1955, PCP Lindell Spar, MD   Past Medical History:  Diagnosis Date   Acute ST elevation myocardial infarction (STEMI) due to occlusion of mid portion of left anterior descending (LAD) coronary artery ([place]) 37/62/8315   Acute systolic heart failure (HCC)    Arthritis    CHF (congestive heart failure) (HCC)    CKD (chronic kidney disease) stage 4, GFR 15-29 ml/min (HCC)    Diabetes mellitus, type II ([place])    Dyspnea    with exertionm periodically   Heart attack ([place]) 03/15/2019   anterior MI    History of kidney stones    Hyperparathyroidism ([place])    Hypertension 2002   Ischemic cardiomyopathy    Left knee pain    Syncope 04/04/2019   after blood draw   Vitamin D deficiency    Past Surgical History:  Procedure Laterality Date   AMPUTATION TOE Left 1991   2 digit of left foot   BIOPSY  02/26/2021   Procedure: BIOPSY;  Surgeon: Rogene Houston, MD;  [facility];  Service: Endoscopy;;   CHOLECYSTECTOMY     COLONOSCOPY     COLONOSCOPY WITH PROPOFOL N/A 02/26/2021   Procedure: COLONOSCOPY WITH PROPOFOL;  Surgeon: Rogene Houston, MD;  [facility];  Service: Endoscopy;  Laterality: N/A;   CORONARY/GRAFT ACUTE MI REVASCULARIZATION N/A 03/12/2019   Procedure: Coronary/Graft Acute MI Revascularization;  Surgeon: Belva Crome, MD;  Location: [place] [facility];  Service: Cardiovascular;  Laterality: N/A;   ESOPHAGOGASTRODUODENOSCOPY (EGD) WITH PROPOFOL N/A 02/26/2021   Procedure: ESOPHAGOGASTRODUODENOSCOPY (EGD) WITH PROPOFOL;  Surgeon: Rogene Houston, MD;  [facility];  Service: Endoscopy;  Laterality: N/A;   ICD IMPLANT N/A 08/24/2019   Procedure: ICD IMPLANT;  Surgeon: Evans Lance, MD;  Location: [place] [facility];  Service: Cardiovascular;  Laterality: N/A;   IR  URETERAL STENT LEFT NEW ACCESS W/O SEP NEPHROSTOMY CATH  09/26/2019   LEFT HEART CATH AND CORONARY ANGIOGRAPHY N/A 03/12/2019   Procedure: LEFT HEART CATH AND CORONARY ANGIOGRAPHY;  Surgeon: Belva Crome, MD;  Location: [place] [facility];  Service: Cardiovascular;  Laterality: N/A;   LITHOTRIPSY Right    NEPHROLITHOTOMY Left 09/26/2019   Procedure: LEFT NEPHROLITHOTOMY PERCUTANEOUS;  Surgeon: Irine Seal, MD;  [facility];  Service: Urology;  Laterality: Left;   Social History   Socioeconomic History   Marital status: Single    Spouse name: Not on file   Number of children: 0   Years of education: Not on file   Highest education level: Some college, no degree  Occupational History   Occupation: retired  Tobacco Use   Smoking status: Former    Packs/day: 0.50    Years: 40.00    Pack years: 20.00    Types: Cigarettes    Quit date: 03/12/2019    Years since quitting: 2.2   Smokeless tobacco: Never  Vaping Use   Vaping Use: Never used  Substance and Sexual Activity   Alcohol use: No   Drug use: No   Sexual activity: Not Currently  Other Topics Concern   Not on file  Social History Narrative   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       Enjoys: fishing, shopping, working around the house       Diet: working on changes, avoiding fried foods, increased veggies   Caffeine: teas some daily   Water: 6-8 cups daily       Wears seat belt    Does not use phone while driving    Oceanographer at home    Social Determinants of Health   Financial Resource Strain: Low Risk    Difficulty of Paying Living Expenses: Not hard at all  Food Insecurity: No Food Insecurity   Worried About Charity fundraiser in the Last Year: Never true   Arboriculturist in the Last Year: Never true  Transportation Needs: No Transportation Needs   Lack of Transportation (Medical): No   Lack of Transportation (Non-Medical): No  Physical Activity: Insufficiently Active   Days of Exercise per  Week: 7 days   Minutes of Exercise per Session: 10 min  Stress: No Stress Concern Present   Feeling of Stress : Not at all  Social Connections: Unknown   Frequency of Communication with Friends and Family: More than three times a week   Frequency of Social Gatherings with Friends and Family: More than three times a week   Attends Religious Services: More than 4 times per year   Active Member of [employer] or Organizations: No   Attends Archivist Meetings: Never   Marital Status: Not on file   Family History  Problem Relation Age of Onset   Heart failure Mother    Heart disease Mother    Cancer Mother        breast   Heart attack Father    Hypertension Father    Heart disease Father    Diabetes Brother    Outpatient Encounter Medications as of 06/12/2021  Medication Sig   Cholecalciferol (VITAMIN D) 50 MCG (2000 UT) CAPS Take 2,000 Units by mouth daily with breakfast.   Alcohol Swabs (B-D SINGLE USE SWABS REGULAR) PADS USE AS DIRECTED (Patient not taking: Reported on 04/23/2021)   allopurinol (ZYLOPRIM) 100 MG tablet Take 100 mg by mouth daily.    aspirin 81 MG chewable tablet Chew 1 tablet (81 mg total) by mouth daily.   atorvastatin (LIPITOR) 80 MG tablet TAKE 1 TABLET BY MOUTH DAILY AT 6PM   blood glucose meter kit and supplies Dispense based on patient and insurance preference. Use up to four times daily as directed. (FOR ICD-10 E10.9, E11.9). (Patient not taking: Reported on 04/01/2021)   BRILINTA 60 MG TABS tablet TAKE (1) TABLET BY MOUTH TWICE DAILY.   carvedilol (COREG) 12.5 MG tablet Take 1 tablet (12.5 mg total) by mouth 2 (two) times daily with a meal.   cinacalcet (SENSIPAR) 30 MG tablet Take 30 mg by mouth daily.   colchicine 0.6 MG tablet Take 0.6 mg by mouth daily.   CORLANOR 5 MG TABS tablet TAKE 1 TABLET BY MOUTH TWICE DAILY WITH A MEAL   fluticasone (FLONASE) 50 MCG/ACT nasal spray Place 2 sprays into both nostrils daily.   furosemide (LASIX) 40 MG tablet  Take 1 tablet (40 mg total) by mouth daily. (Patient taking differently: Take 40 mg by mouth as needed.)   nitroGLYCERIN (NITROSTAT) 0.4 MG SL tablet Place 1 tablet (0.4 mg total) under the tongue every 5 (five) minutes as needed.  oxyCODONE-acetaminophen (PERCOCET) 10-325 MG tablet Take 1 tablet by mouth every 8 (eight) hours as needed for pain.   pantoprazole (PROTONIX) 40 MG tablet Take 1 tablet (40 mg total) by mouth 2 (two) times daily before a meal.   potassium citrate (UROCIT-K) 10 MEQ (1080 MG) SR tablet Take 1 tablet by mouth daily. (Patient not taking: Reported on 06/12/2021)   TRUEplus Lancets 30G MISC Use as directed to check blood sugar daily. (Patient not taking: Reported on 04/01/2021)   No facility-administered encounter medications on file as of 06/12/2021.   ALLERGIES: No Known Allergies  VACCINATION STATUS: Immunization History  Administered Date(s) Administered   Fluad Quad(high Dose 65+) 02/27/2021   Influenza,inj,Quad PF,6+ Mos 12/27/2019   Janssen (J&J) SARS-COV-2 Vaccination 07/23/2019    HPI Takoda Janowiak is 66 y.o. male who presents today with a medical history as above. he is being seen in consultation for hyperparathyroidism  requested by Lindell Spar, MD.  History is obtained mainly from chart review .  Patient is not optimal historian. Per his medical review, he has stage 4-5 CKD on nephrology follow-up. His calcium ranged between 9.3-10.5.  Recently his PTH was elevated at 148 along with vitamin D deficiency of 26.  He did have 24-hour urine calcium measurement on 2 separate occasions showing 70 and 78 mg / 24 hours. He does have a remote past history of nephrolithiasis.  No recent bone density.  Patient denies any history of fragility fractures.  He is not on calcium supplements.  He is on Sensipar 30 mg p.o. daily. He has multiple other medical problems including hypertension, coronary disease, CHF, CKD, prediabetes. Patient denies any prior history of  parathyroid/thyroid dysfunction.  No family history of such disorders . He does not have acute complaints/symptoms at this time.  Review of Systems  Constitutional: + Fluctuating body weight, no fatigue, no subjective hyperthermia, no subjective hypothermia Eyes: no blurry vision, no xerophthalmia ENT: no sore throat, no nodules palpated in throat, no dysphagia/odynophagia, no hoarseness Cardiovascular: no Chest Pain, no Shortness of Breath, no palpitations, no leg swelling Respiratory: no cough, no shortness of breath Gastrointestinal: no Nausea/Vomiting/Diarhhea Musculoskeletal: no muscle/joint aches Skin: no rashes Neurological: no tremors, no numbness, no tingling, no dizziness Psychiatric: no depression, no anxiety  Objective:    Vitals with BMI 06/12/2021 06/10/2021 05/14/2021  Height _0  _1  _2   Weight 207 lbs 6 oz 204 lbs 201 lbs 1 oz  BMI 28.12 63.84 66.59  Systolic 935 701 -  Diastolic 84 88 -  Pulse 68 62 -    BP 124/84    Pulse 68    Ht 6' (1.829 m)    Wt 207 lb 6.4 oz (94.1 kg)    BMI 28.13 kg/m   Wt Readings from Last 3 Encounters:  06/12/21 207 lb 6.4 oz (94.1 kg)  06/10/21 204 lb (92.5 kg)  05/14/21 201 lb 0.6 oz (91.2 kg)    Physical Exam  Constitutional:  Body mass index is 28.13 kg/m.,  not in acute distress, normal state of mind Eyes: PERRLA, EOMI, no exophthalmos ENT: moist mucous membranes, no gross thyromegaly, no gross cervical lymphadenopathy Cardiovascular: normal precordial activity, Regular Rate and Rhythm, no Murmur/Rubs/Gallops Respiratory:  adequate breathing efforts, no gross chest deformity, Clear to auscultation bilaterally Gastrointestinal: abdomen soft, Non -tender, No distension, Bowel Sounds present, no gross organomegaly Musculoskeletal: no gross deformities, strength intact in all four extremities Skin: moist, warm, no rashes Neurological: no tremor with outstretched hands, Deep tendon  reflexes normal in bilateral lower  extremities.  CMP ( most recent) CMP     Component Value Date/Time   NA 139 03/18/2021 1311   NA 141 08/28/2020 1009   K 4.2 03/18/2021 1311   CL 108 03/18/2021 1311   CO2 23 03/18/2021 1311   GLUCOSE 128 (H) 03/18/2021 1311   BUN 26 (H) 03/18/2021 1311   BUN 23 08/28/2020 1009   CREATININE 3.48 (H) 03/18/2021 1311   CREATININE 2.35 (H) 05/28/2016 0938   CALCIUM 10.4 (H) 03/18/2021 1311   PROT 7.1 02/22/2021 1100   PROT 7.1 08/28/2020 1009   ALBUMIN 3.6 02/22/2021 1100   ALBUMIN 4.2 08/28/2020 1009   AST 23 02/22/2021 1100   ALT 11 02/22/2021 1100   ALKPHOS 106 02/22/2021 1100   BILITOT 0.9 02/22/2021 1100   BILITOT 0.8 08/28/2020 1009   GFRNONAA 19 (L) 03/18/2021 1311   GFRAA 23 (L) 09/29/2019 0744     Diabetic Labs (most recent): Lab Results  Component Value Date   HGBA1C 6.0 (H) 08/28/2020   HGBA1C 5.4 03/13/2019   HGBA1C 5.0 05/28/2016     Lipid Panel ( most recent) Lipid Panel     Component Value Date/Time   CHOL 126 08/28/2020 1009   TRIG 118 08/28/2020 1009   HDL 52 08/28/2020 1009   CHOLHDL 2.4 08/28/2020 1009   CHOLHDL 2.3 02/15/2020 0930   VLDL 32 02/15/2020 0930   LDLCALC 53 08/28/2020 1009   LABVLDL 21 08/28/2020 1009      Lab Results  Component Value Date   TSH 0.993 08/28/2020           Assessment & Plan:   1. Other hyperparathyroidism ([place])  - Rogan Wigley  is being seen at a kind request of Lindell Spar, MD. - I have reviewed his available  records and clinically evaluated the patient. - Based on these reviews, he has elevated PTH most likely related to CKD.  Review of his company medical records did not reveal significant hypercalcemia.  -Not enough evidence for primary hyperparathyroidism at this time.  It is likely that he has secondary hyperparathyroidism driven by CKD. -He will not need surgical intervention.  He is advised to continue Sensipar 30 mg p.o. daily.  He will benefit from vitamin D3 supplements. Patient  will be considered for repeat 24-hour urine calcium, PTH/calcium, phosphorus, magnesium and office visit in 6 months. He will also be considered for screening bone density after his next visit. He - he is advised to maintain close follow up with Lindell Spar, MD for primary care needs, as well as his nephrologist for advancing CKD.   - Time spent with the patient: 60 minutes, of which >50% was spent in  counseling him about his secondary hyperparathyroidism, vitamin D deficiency and the rest in obtaining information about his symptoms, reviewing his previous labs/studies ( including abstractions from other facilities),  evaluations, and treatments,  and developing a plan to confirm diagnosis and long term treatment based on the latest standards of care/guidelines; and documenting his care.  [name] participated in the discussions, expressed understanding, and voiced agreement with the above plans.  All questions were answered to his satisfaction. he is encouraged to contact clinic should he have any questions or concerns prior to his return visit.  Follow up plan: Return in about 6 months (around 12/10/2021) for F/U with Pre-visit Labs, [place], MD [facility]  [address] [place], [place] [address] Phone: [phone]  Fax: [phone]     06/12/2021, 11:44 AM  This note was partially dictated with voice recognition software. Similar sounding words can be transcribed inadequately or may not  be corrected upon review.

## 2021-06-12 NOTE — Telephone Encounter (Signed)
Pt advised with verbal understanding  °

## 2021-06-13 DIAGNOSIS — E559 Vitamin D deficiency, unspecified: Secondary | ICD-10-CM | POA: Insufficient documentation

## 2021-06-13 DIAGNOSIS — E212 Other hyperparathyroidism: Secondary | ICD-10-CM | POA: Insufficient documentation

## 2021-06-18 ENCOUNTER — Encounter (HOSPITAL_COMMUNITY): Payer: Medicare Other | Admitting: Internal Medicine

## 2021-06-18 DIAGNOSIS — H401223 Low-tension glaucoma, left eye, severe stage: Secondary | ICD-10-CM | POA: Diagnosis not present

## 2021-06-18 DIAGNOSIS — H5213 Myopia, bilateral: Secondary | ICD-10-CM | POA: Diagnosis not present

## 2021-06-18 LAB — HM DIABETES EYE EXAM

## 2021-07-01 ENCOUNTER — Other Ambulatory Visit: Payer: Self-pay | Admitting: Internal Medicine

## 2021-07-01 ENCOUNTER — Telehealth: Payer: Self-pay | Admitting: Internal Medicine

## 2021-07-01 ENCOUNTER — Encounter: Payer: Self-pay | Admitting: Internal Medicine

## 2021-07-01 ENCOUNTER — Other Ambulatory Visit: Payer: Self-pay

## 2021-07-01 ENCOUNTER — Ambulatory Visit (INDEPENDENT_AMBULATORY_CARE_PROVIDER_SITE_OTHER): Payer: Medicare Other | Admitting: Internal Medicine

## 2021-07-01 VITALS — BP 124/84 | HR 60 | Ht 72.0 in | Wt 210.0 lb

## 2021-07-01 DIAGNOSIS — M170 Bilateral primary osteoarthritis of knee: Secondary | ICD-10-CM

## 2021-07-01 DIAGNOSIS — G894 Chronic pain syndrome: Secondary | ICD-10-CM

## 2021-07-01 DIAGNOSIS — I255 Ischemic cardiomyopathy: Secondary | ICD-10-CM | POA: Diagnosis not present

## 2021-07-01 MED ORDER — OXYCODONE-ACETAMINOPHEN 10-325 MG PO TABS: 1.0000 | ORAL_TABLET | Freq: Two times a day (BID) | ORAL | 0 refills | Status: DC | PRN

## 2021-07-01 NOTE — Telephone Encounter (Signed)
Patient came by office to give FYI patient is high risk for knee surgery and patient has appt with Dr. Haroldine Laws on march 15th.

## 2021-07-01 NOTE — Progress Notes (Signed)
HPI Mr. Swindler returns for ongoing ICD followup. He is a pleasant 66 yo man with chronic systolic heart failure, an ICM and renal insufficiency. He is s/p PCI. He underwent ICD Insertion almost 2 years ago. He has done well in the interim. No residual chest pain. He has class 2 dyspnea. His main complaint is bilateral knee pain.  No Known Allergies   Current Outpatient Medications  Medication Sig Dispense Refill   allopurinol (ZYLOPRIM) 100 MG tablet Take 100 mg by mouth daily.      aspirin 81 MG chewable tablet Chew 1 tablet (81 mg total) by mouth daily.     atorvastatin (LIPITOR) 80 MG tablet TAKE 1 TABLET BY MOUTH DAILY AT 6PM 90 tablet 0   BRILINTA 60 MG TABS tablet TAKE (1) TABLET BY MOUTH TWICE DAILY. 180 tablet 1   carvedilol (COREG) 12.5 MG tablet Take 1 tablet (12.5 mg total) by mouth 2 (two) times daily with a meal. 180 tablet 3   Cholecalciferol (VITAMIN D) 50 MCG (2000 UT) CAPS Take 2,000 Units by mouth daily with breakfast.     cinacalcet (SENSIPAR) 30 MG tablet Take 30 mg by mouth daily.     colchicine 0.6 MG tablet Take 0.6 mg by mouth daily.     CORLANOR 5 MG TABS tablet TAKE 1 TABLET BY MOUTH TWICE DAILY WITH A MEAL 60 tablet 1   fluticasone (FLONASE) 50 MCG/ACT nasal spray Place 2 sprays into both nostrils daily. 16 g 6   furosemide (LASIX) 40 MG tablet Take 1 tablet (40 mg total) by mouth daily. (Patient taking differently: Take 40 mg by mouth as needed.) 30 tablet 11   nitroGLYCERIN (NITROSTAT) 0.4 MG SL tablet Place 1 tablet (0.4 mg total) under the tongue every 5 (five) minutes as needed. 25 tablet 2   oxyCODONE-acetaminophen (PERCOCET) 10-325 MG tablet Take 1 tablet by mouth every 8 (eight) hours as needed for pain. 30 tablet 0   pantoprazole (PROTONIX) 40 MG tablet Take 1 tablet (40 mg total) by mouth 2 (two) times daily before a meal. 60 tablet 5   Alcohol Swabs (B-D SINGLE USE SWABS REGULAR) PADS USE AS DIRECTED (Patient not taking: Reported on 04/23/2021)  100 each 0   blood glucose meter kit and supplies Dispense based on patient and insurance preference. Use up to four times daily as directed. (FOR ICD-10 E10.9, E11.9). (Patient not taking: Reported on 04/01/2021) 1 each 0   potassium citrate (UROCIT-K) 10 MEQ (1080 MG) SR tablet Take 1 tablet by mouth daily. (Patient not taking: Reported on 06/12/2021)     TRUEplus Lancets 30G MISC Use as directed to check blood sugar daily. (Patient not taking: Reported on 04/01/2021) 100 each 1   No current facility-administered medications for this visit.     Past Medical History:  Diagnosis Date   Acute ST elevation myocardial infarction (STEMI) due to occlusion of mid portion of left anterior descending (LAD) coronary artery ([place]) 54/56/2563   Acute systolic heart failure (HCC)    Arthritis    CHF (congestive heart failure) (HCC)    CKD (chronic kidney disease) stage 4, GFR 15-29 ml/min (HCC)    Diabetes mellitus, type II ([place])    Dyspnea    with exertionm periodically   Heart attack ([place]) 03/15/2019   anterior MI    History of kidney stones    Hyperparathyroidism ([place])    Hypertension 2002   Ischemic cardiomyopathy    Left knee pain  Syncope 04/04/2019   after blood draw   Vitamin D deficiency     ROS:   All systems reviewed and negative except as noted in the HPI.   Past Surgical History:  Procedure Laterality Date   AMPUTATION TOE Left 1991   2 digit of left foot   BIOPSY  02/26/2021   Procedure: BIOPSY;  Surgeon: Rogene Houston, MD;  [facility];  Service: Endoscopy;;   CHOLECYSTECTOMY     COLONOSCOPY     COLONOSCOPY WITH PROPOFOL N/A 02/26/2021   Procedure: COLONOSCOPY WITH PROPOFOL;  Surgeon: Rogene Houston, MD;  [facility];  Service: Endoscopy;  Laterality: N/A;   CORONARY/GRAFT ACUTE MI REVASCULARIZATION N/A 03/12/2019   Procedure: Coronary/Graft Acute MI Revascularization;  Surgeon: Belva Crome, MD;  Location: [place] [facility];  Service:  Cardiovascular;  Laterality: N/A;   ESOPHAGOGASTRODUODENOSCOPY (EGD) WITH PROPOFOL N/A 02/26/2021   Procedure: ESOPHAGOGASTRODUODENOSCOPY (EGD) WITH PROPOFOL;  Surgeon: Rogene Houston, MD;  [facility];  Service: Endoscopy;  Laterality: N/A;   ICD IMPLANT N/A 08/24/2019   Procedure: ICD IMPLANT;  Surgeon: Evans Lance, MD;  Location: [place] [facility];  Service: Cardiovascular;  Laterality: N/A;   IR URETERAL STENT LEFT NEW ACCESS W/O SEP NEPHROSTOMY CATH  09/26/2019   LEFT HEART CATH AND CORONARY ANGIOGRAPHY N/A 03/12/2019   Procedure: LEFT HEART CATH AND CORONARY ANGIOGRAPHY;  Surgeon: Belva Crome, MD;  Location: [place] [facility];  Service: Cardiovascular;  Laterality: N/A;   LITHOTRIPSY Right    NEPHROLITHOTOMY Left 09/26/2019   Procedure: LEFT NEPHROLITHOTOMY PERCUTANEOUS;  Surgeon: Irine Seal, MD;  [facility];  Service: Urology;  Laterality: Left;     Family History  Problem Relation Age of Onset   Heart failure Mother    Heart disease Mother    Cancer Mother        breast   Heart attack Father    Hypertension Father    Heart disease Father    Diabetes Brother      Social History   Socioeconomic History   Marital status: Single    Spouse name: Not on file   Number of children: 0   Years of education: Not on file   Highest education level: Some college, no degree  Occupational History   Occupation: retired  Tobacco Use   Smoking status: Former    Packs/day: 0.50    Years: 40.00    Pack years: 20.00    Types: Cigarettes    Quit date: 03/12/2019    Years since quitting: 2.3   Smokeless tobacco: Never  Vaping Use   Vaping Use: Never used  Substance and Sexual Activity   Alcohol use: No   Drug use: No   Sexual activity: Not Currently  Other Topics Concern   Not on file  Social History Narrative   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       Enjoys: fishing, shopping, working around the house       Diet: working on changes, avoiding  fried foods, increased veggies   Caffeine: teas some daily   Water: 6-8 cups daily       Wears seat belt    Does not use phone while driving    Oceanographer at home    Social Determinants of Health   Financial Resource Strain: Low Risk    Difficulty of Paying Living Expenses: Not hard at all  Food Insecurity: No Food Insecurity  Worried About Charity fundraiser in the Last Year: Never true   [place] in the Last Year: Never true  Transportation Needs: No Transportation Needs   Lack of Transportation (Medical): No   Lack of Transportation (Non-Medical): No  Physical Activity: Insufficiently Active   Days of Exercise per Week: 7 days   Minutes of Exercise per Session: 10 min  Stress: No Stress Concern Present   Feeling of Stress : Not at all  Social Connections: Unknown   Frequency of Communication with Friends and Family: More than three times a week   Frequency of Social Gatherings with Friends and Family: More than three times a week   Attends Religious Services: More than 4 times per year   Active Member of [employer] or Organizations: No   Attends Music therapist: Never   Marital Status: Not on file  Intimate Partner Violence: Not At Risk   Fear of Current or Ex-Partner: No   Emotionally Abused: No   Physically Abused: No   Sexually Abused: No     BP 124/84    Pulse 60    Ht 6' (1.829 m)    Wt 210 lb (95.3 kg)    SpO2 99%    BMI 28.48 kg/m   Physical Exam:  Well appearing 66 yo man, NAD HEENT: Unremarkable Neck:  No JVD, no thyromegally Lymphatics:  No adenopathy Back:  No CVA tenderness Lungs:  Clear with no wheezes HEART:  Regular rate rhythm, no murmurs, no rubs, no clicks Abd:  soft, positive bowel sounds, no organomegally, no rebound, no guarding Ext:  2 plus pulses, no edema, no cyanosis, no clubbing Skin:  No rashes no nodules Neuro:  CN II through XII intact, motor grossly intact   DEVICE  Normal device function.  See PaceArt  for details.   Assess/Plan:  1. Chronic systolic heart failure - he has class 2 symptoms and we will continue his current meds. He remains with class 2 symptoms. Continue maximal medical therapy 2. CAD, s/p anterior MI - he denies anginal symptoms. No change in meds. 3. HTN - his bp is well controlled. NO change in meds fo rnow 4. ICD - his [place] [employer] single chamber ICD is working normally. He fluid status is stable with review of heart logic. He will continue his current treatment.  Carleene Overlie Christella App,MD

## 2021-07-01 NOTE — Telephone Encounter (Signed)
Patient called in this morning requesting refill on  oxyCODONE-acetaminophen (PERCOCET) 10-325 MG tablet   Patients sates that heart clinic is postponing surgery. They do not want patient to undergo anesthesia at the current time may pose risk.   Pt will be needing meds for knees ( bone on bone ) until surgery can be done.   Patient is requesting a call back.

## 2021-07-01 NOTE — Telephone Encounter (Signed)
Pt advised we do not do chronic pain management he saw cardiologist this am and they told him to contact pcp he said he does have appt with [facility] April 24 let pt know I would send a message to provider to see if he would refill pt stated he doesn't understand why dr patel is his pcp but will not continue to fill his pain medicine

## 2021-07-01 NOTE — Patient Instructions (Signed)
Medication Instructions:  Your physician recommends that you continue on your current medications as directed. Please refer to the Current Medication list given to you today.  *If you need a refill on your cardiac medications before your next appointment, please call your pharmacy*   Lab Work: NONE   If you have labs (blood work) drawn today and your tests are completely normal, you will receive your results only by: . MyChart Message (if you have MyChart) OR . A paper copy in the mail If you have any lab test that is abnormal or we need to change your treatment, we will call you to review the results.   Testing/Procedures: NONE    Follow-Up: At [facility], you and your health needs are our priority.  As part of our continuing mission to provide you with exceptional heart care, we have created designated Provider Care Teams.  These Care Teams include your primary Cardiologist (physician) and Advanced Practice Providers (APPs -  Physician Assistants and Nurse Practitioners) who all work together to provide you with the care you need, when you need it.  We recommend signing up for the patient portal called "MyChart".  Sign up information is provided on this After Visit Summary.  MyChart is used to connect with patients for Virtual Visits (Telemedicine).  Patients are able to view lab/test results, encounter notes, upcoming appointments, etc.  Non-urgent messages can be sent to your provider as well.   To learn more about what you can do with MyChart, go to [email or web address].    Your next appointment:   1 year(s)  The format for your next appointment:   In Person  Provider:   Gregg Taylor, MD   Other Instructions Thank you for choosing [facility]!    

## 2021-07-10 ENCOUNTER — Ambulatory Visit (INDEPENDENT_AMBULATORY_CARE_PROVIDER_SITE_OTHER): Payer: Medicare Other | Admitting: Urology

## 2021-07-10 ENCOUNTER — Ambulatory Visit (HOSPITAL_COMMUNITY)
Admission: RE | Admit: 2021-07-10 | Discharge: 2021-07-10 | Disposition: A | Discharge status: Home / Self Care | Payer: Medicare Other | Source: Ambulatory Visit | Attending: Urology | Admitting: Urology

## 2021-07-10 ENCOUNTER — Other Ambulatory Visit: Payer: Self-pay

## 2021-07-10 VITALS — BP 135/85 | HR 60 | Ht 72.0 in | Wt 210.0 lb

## 2021-07-10 DIAGNOSIS — N200 Calculus of kidney: Secondary | ICD-10-CM

## 2021-07-10 DIAGNOSIS — M47816 Spondylosis without myelopathy or radiculopathy, lumbar region: Secondary | ICD-10-CM | POA: Diagnosis not present

## 2021-07-10 DIAGNOSIS — I878 Other specified disorders of veins: Secondary | ICD-10-CM | POA: Diagnosis not present

## 2021-07-10 DIAGNOSIS — Z9049 Acquired absence of other specified parts of digestive tract: Secondary | ICD-10-CM | POA: Diagnosis not present

## 2021-07-10 NOTE — Progress Notes (Signed)
Subjective: 1. Renal stones   2. Hypercalcemia     07/10/21:  Fadi returns today in f/u for his history of stones.  He has had no flank pain or hematuria.  KUB today stones stable bilateral lower pole stones with at most minimal further enlargement of the RLP stone.  He had a parathyroid scan for hyperparathyroidism and was found the have a left parathyroid nodule.   He has a history of hyperparathyroidism.   06/20/20: Bilateral nephrolithiasis with a partial staghorn on the left and moderate left upper pole hydronephrosis with atrophy of the upper pole.  He is doing well s/p left PCNL on 09/26/19.  Postop he had a residual  70m LUP stone that was in a calyx with a stenosed infundibulum that I was unable to access and has a 124mcollection of stones in the RLP.   A renal [place] to this visit shows a 2.1cm stone on the right and a 1.5cm stone on the left but no obstruction.  His last Cr was back up to 3.66.  His IPSS is 1 with nocturia x 1.    GU Hx: Mr. BlJoungs a [age]yoAM who is sent in consultation by Dr. MaDicky Doeor bilateral nephrolithiasis found on a recent renal US[place] evaluation of CKD 4.  There is a 1.6cm RLP stone and a probable staghorn stone on the left with moderate hydronephrosis.  He had a repeat CT prior to this visit to better assess the stone burden and the findings were confirmed with left upper pole obstruction.   There was a 1.6cm LLP partial staghorn stone and an 28m90mMP stone and 1.2cm LUP stone with upper pole obstruction and atrophy.  Cysts were present bilateral on both the CT and renal US.[place] His recent Cr is 2.78 which is stable over the past 3 months but down from the high.  His Calcium is 10.7 and he says he is being evaluated for hyperparathyroidism.   He has no pain.  He has no hematuria. He has had prior stones and had a lithotripsy several years ago.  He is currently on Brilinta for a coronary stent that was placed in November and is going to be off of it from  placement of a defibrillator next week.  He will continue an 7m2mA.  .   ROS:  ROS   No Known Allergies  Past Medical History:  Diagnosis Date   Acute ST elevation myocardial infarction (STEMI) due to occlusion of mid portion of left anterior descending (LAD) coronary artery (HCC)[place]/[id number]/5284[place]ute systolic heart failure (HCC)    Arthritis    CHF (congestive heart failure) (HCC)    CKD (chronic kidney disease) stage 4, GFR 15-29 ml/min (HCC)    Diabetes mellitus, type II (HCC)[place] Dyspnea    with exertionm periodically   Heart attack (HCC)[place]/03/2019   anterior MI    History of kidney stones    Hyperparathyroidism (HCC)[place] Hypertension 2002   Ischemic cardiomyopathy    Left knee pain    Syncope 04/04/2019   after blood draw   Vitamin D deficiency     Past Surgical History:  Procedure Laterality Date   AMPUTATION TOE Left 1991   2 digit of left foot   BIOPSY  02/26/2021   Procedure: BIOPSY;  Surgeon: RehmRogene Houston;  [facility];  Service: Endoscopy;;   CHOLECYSTECTOMY     COLONOSCOPY  COLONOSCOPY WITH PROPOFOL N/A 02/26/2021   Procedure: COLONOSCOPY WITH PROPOFOL;  Surgeon: Rogene Houston, MD;  [facility];  Service: Endoscopy;  Laterality: N/A;   CORONARY/GRAFT ACUTE MI REVASCULARIZATION N/A 03/12/2019   Procedure: Coronary/Graft Acute MI Revascularization;  Surgeon: Belva Crome, MD;  Location: [facility];  Service: Cardiovascular;  Laterality: N/A;   ESOPHAGOGASTRODUODENOSCOPY (EGD) WITH PROPOFOL N/A 02/26/2021   Procedure: ESOPHAGOGASTRODUODENOSCOPY (EGD) WITH PROPOFOL;  Surgeon: Rogene Houston, MD;  [facility];  Service: Endoscopy;  Laterality: N/A;   ICD IMPLANT N/A 08/24/2019   Procedure: ICD IMPLANT;  Surgeon: Evans Lance, MD;  Location: [facility];  Service: Cardiovascular;  Laterality: N/A;   IR URETERAL STENT LEFT NEW ACCESS W/O SEP NEPHROSTOMY CATH  09/26/2019   LEFT HEART CATH AND  CORONARY ANGIOGRAPHY N/A 03/12/2019   Procedure: LEFT HEART CATH AND CORONARY ANGIOGRAPHY;  Surgeon: Belva Crome, MD;  Location: [facility];  Service: Cardiovascular;  Laterality: N/A;   LITHOTRIPSY Right    NEPHROLITHOTOMY Left 09/26/2019   Procedure: LEFT NEPHROLITHOTOMY PERCUTANEOUS;  Surgeon: Irine Seal, MD;  Location: [facility];  Service: Urology;  Laterality: Left;    Social History   Socioeconomic History   Marital status: Single    Spouse name: Not on file   Number of children: 0   Years of education: Not on file   Highest education level: Some college, no degree  Occupational History   Occupation: retired  Tobacco Use   Smoking status: Former    Packs/day: 0.50    Years: 40.00    Pack years: 20.00    Types: Cigarettes    Quit date: 03/12/2019    Years since quitting: 2.3   Smokeless tobacco: Never  Vaping Use   Vaping Use: Never used  Substance and Sexual Activity   Alcohol use: No   Drug use: No   Sexual activity: Not Currently  Other Topics Concern   Not on file  Social History Narrative   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       Enjoys: fishing, shopping, working around the house       Diet: working on changes, avoiding fried foods, increased veggies   Caffeine: teas some daily   Water: 6-8 cups daily       Wears seat belt    Does not use phone while driving    Oceanographer at home    Social Determinants of Health   Financial Resource Strain: Low Risk    Difficulty of Paying Living Expenses: Not hard at all  Food Insecurity: No Food Insecurity   Worried About Charity fundraiser in the Last Year: Never true   Arboriculturist in the Last Year: Never true  Transportation Needs: No Transportation Needs   Lack of Transportation (Medical): No   Lack of Transportation (Non-Medical): No  Physical Activity: Insufficiently Active   Days of Exercise per Week: 7 days   Minutes of Exercise per Session: 10 min  Stress: No Stress Concern Present    Feeling of Stress : Not at all  Social Connections: Unknown   Frequency of Communication with Friends and Family: More than three times a week   Frequency of Social Gatherings with Friends and Family: More than three times a week   Attends Religious Services: More than 4 times per year   Active Member of [employer] or Organizations: No   Attends Archivist Meetings:  Never   Marital Status: Not on file  Intimate Partner Violence: Not At Risk   Fear of Current or Ex-Partner: No   Emotionally Abused: No   Physically Abused: No   Sexually Abused: No    Family History  Problem Relation Age of Onset   Heart failure Mother    Heart disease Mother    Cancer Mother        breast   Heart attack Father    Hypertension Father    Heart disease Father    Diabetes Brother     Anti-infectives: Anti-infectives (From admission, onward)    None       Current Outpatient Medications  Medication Sig Dispense Refill   allopurinol (ZYLOPRIM) 100 MG tablet Take 100 mg by mouth daily.      aspirin 81 MG chewable tablet Chew 1 tablet (81 mg total) by mouth daily.     atorvastatin (LIPITOR) 80 MG tablet TAKE 1 TABLET BY MOUTH DAILY AT 6PM 90 tablet 0   BRILINTA 60 MG TABS tablet TAKE (1) TABLET BY MOUTH TWICE DAILY. 180 tablet 1   carvedilol (COREG) 12.5 MG tablet Take 1 tablet (12.5 mg total) by mouth 2 (two) times daily with a meal. 180 tablet 3   Cholecalciferol (VITAMIN D) 50 MCG (2000 UT) CAPS Take 2,000 Units by mouth daily with breakfast.     cinacalcet (SENSIPAR) 30 MG tablet Take 30 mg by mouth daily.     colchicine 0.6 MG tablet Take 0.6 mg by mouth daily.     CORLANOR 5 MG TABS tablet TAKE 1 TABLET BY MOUTH TWICE DAILY WITH A MEAL 60 tablet 1   fluticasone (FLONASE) 50 MCG/ACT nasal spray Place 2 sprays into both nostrils daily. 16 g 6   furosemide (LASIX) 40 MG tablet Take 1 tablet (40 mg total) by mouth daily. (Patient taking differently: Take 40 mg by mouth as needed.) 30  tablet 11   nitroGLYCERIN (NITROSTAT) 0.4 MG SL tablet Place 1 tablet (0.4 mg total) under the tongue every 5 (five) minutes as needed. 25 tablet 2   oxyCODONE-acetaminophen (PERCOCET) 10-325 MG tablet Take 1 tablet by mouth every 12 (twelve) hours as needed for pain. 60 tablet 0   pantoprazole (PROTONIX) 40 MG tablet Take 1 tablet (40 mg total) by mouth 2 (two) times daily before a meal. 60 tablet 5   Alcohol Swabs (B-D SINGLE USE SWABS REGULAR) PADS USE AS DIRECTED (Patient not taking: Reported on 04/23/2021) 100 each 0   blood glucose meter kit and supplies Dispense based on patient and insurance preference. Use up to four times daily as directed. (FOR ICD-10 E10.9, E11.9). (Patient not taking: Reported on 04/01/2021) 1 each 0   TRUEplus Lancets 30G MISC Use as directed to check blood sugar daily. (Patient not taking: Reported on 04/01/2021) 100 each 1   No current facility-administered medications for this visit.     Objective:  BP 135/85    Pulse 60    Ht 6' (1.829 m)    Wt 210 lb (95.3 kg)    BMI 28.48 kg/m    Physical Exam  Lab Results:  Cr was 3.31with a Ca of 10.2 and a PTH of 148 on recent labs.      UA has 6-10 WBC's No results found for this or any previous visit (from the past 24 hour(s)).   BMET   Studies/Results: CLINICAL DATA:  Stage 4 chronic kidney disease.   EXAM: RENAL / URINARY TRACT ULTRASOUND COMPLETE  COMPARISON:  07/11/2019 CT.   FINDINGS: Right Kidney:   Renal measurements: 10.3 x 5.0 x 5.3 cm = volume: 143 mL. Increased renal echogenicity. No hydronephrosis.   Lower pole dominant 2.1 cm stone. Renal cysts or complex cysts including at 1.7 cm.   Left Kidney:   Renal measurements: 11.8 x 5.6 x 4.5 cm = volume: 157 mL. Increased echogenicity. No hydronephrosis. Stones of up to 1.5 cm including within the interpolar region.   Dominant lower pole left renal cyst of 6.0 cm.   Bladder:   Appears normal for degree of bladder distention.    Other:   Hyperechoic 3.3 cm splenic lesion including on image 38.   IMPRESSION: 1.  No hydronephrosis. 2. Increased renal echogenicity, suggesting medical renal disease. 3. Bilateral nephrolithiasis. 4. 3.3 cm nonspecific hyperechoic splenic lesion. Most likely a benign lesion such as a hemangioma or hamartoma. Consider ultrasound surveillance at 6 months.     Electronically Signed   By: Abigail Miyamoto M.D.   On: 06/05/2020 15:08      Reading Physician  Abigail Miyamoto, MD   [phone]  [phone]       Parathyroid scan reviewed.  Left adenoma noted.  KUB reviewed.  Stable lower pole stones noted.  DG Abd 1 View  Result Date: 07/10/2021 CLINICAL DATA:  renal stones EXAM: ABDOMEN - 1 VIEW COMPARISON:  February 22, 2021 FINDINGS: Nonobstructive bowel gas pattern. Multiple radiopaque foci project over the contours of bilateral kidneys. Largest conglomeration of radiopaque foci over the RIGHT kidney measures 13 mm. Largest over the LEFT kidney measures 10 mm. This is similar in comparison to prior CT. Status post cholecystectomy. Pelvic phleboliths. Degenerative changes of the lumbar spine. IMPRESSION: Multiple bilateral nephrolithiasis. Electronically Signed   By: Valentino Saxon M.D.   On: 07/10/2021 13:06    Assessment/Plan: Bilateral nephrolithiasis remains stable on imaging today.  I will have him return in 1 year with a KUB.     Hypercalciuria.  He has hyperparathyroidism with an adenoma but is not interested in surgical therapy at this time.      .    No orders of the defined types were placed in this encounter.    Orders Placed This Encounter  Procedures   DG Abd 1 View    Order Specific Question:   Reason for Exam (SYMPTOM  OR DIAGNOSIS REQUIRED)    Answer:   renal stones    Order Specific Question:   Preferred imaging location?    Answer:   [facility]    Order Specific Question:   Radiology Contrast Protocol - do NOT remove file path    Answer:    [email or web address]   DG Abd 1 View    Standing Status:   Future    Standing Expiration Date:   07/11/2022    Order Specific Question:   Reason for Exam (SYMPTOM  OR DIAGNOSIS REQUIRED)    Answer:   renal stones    Order Specific Question:   Preferred imaging location?    Answer:   [facility]    Order Specific Question:   Radiology Contrast Protocol - do NOT remove file path    Answer:   [email or web address]   Urinalysis, Routine w reflex microscopic     Return in about 1 year (around 07/11/2022) for with KUB.    CC: Dr. Boris Sharper, Dr. Dicky Doe and Dr. Glori Bickers.      Irine Seal 07/11/2021 [phone]

## 2021-07-11 LAB — MICROSCOPIC EXAMINATION
Bacteria, UA: NONE SEEN
Renal Epithel, UA: NONE SEEN /hpf

## 2021-07-11 LAB — URINALYSIS, ROUTINE W REFLEX MICROSCOPIC
Bilirubin, UA: NEGATIVE
Glucose, UA: NEGATIVE
Ketones, UA: NEGATIVE
Nitrite, UA: NEGATIVE
Protein,UA: NEGATIVE
Specific Gravity, UA: 1.015 (ref 1.005–1.030)
Urobilinogen, Ur: 0.2 mg/dL (ref 0.2–1.0)
pH, UA: 6 (ref 5.0–7.5)

## 2021-07-16 ENCOUNTER — Other Ambulatory Visit: Payer: Self-pay

## 2021-07-16 ENCOUNTER — Ambulatory Visit (HOSPITAL_COMMUNITY)
Admission: RE | Admit: 2021-07-16 | Discharge: 2021-07-16 | Disposition: A | Discharge status: Home / Self Care | Payer: Medicare Other | Source: Ambulatory Visit | Attending: Internal Medicine | Admitting: Internal Medicine

## 2021-07-16 ENCOUNTER — Encounter (HOSPITAL_COMMUNITY): Payer: Self-pay | Admitting: Internal Medicine

## 2021-07-16 VITALS — BP 120/80 | HR 61 | Wt 212.2 lb

## 2021-07-16 DIAGNOSIS — D62 Acute posthemorrhagic anemia: Secondary | ICD-10-CM | POA: Diagnosis not present

## 2021-07-16 DIAGNOSIS — I130 Hypertensive heart and chronic kidney disease with heart failure and stage 1 through stage 4 chronic kidney disease, or unspecified chronic kidney disease: Secondary | ICD-10-CM | POA: Diagnosis not present

## 2021-07-16 DIAGNOSIS — Z79899 Other long term (current) drug therapy: Secondary | ICD-10-CM | POA: Insufficient documentation

## 2021-07-16 DIAGNOSIS — I5022 Chronic systolic (congestive) heart failure: Secondary | ICD-10-CM | POA: Diagnosis not present

## 2021-07-16 DIAGNOSIS — N184 Chronic kidney disease, stage 4 (severe): Secondary | ICD-10-CM

## 2021-07-16 DIAGNOSIS — Z955 Presence of coronary angioplasty implant and graft: Secondary | ICD-10-CM | POA: Diagnosis not present

## 2021-07-16 DIAGNOSIS — I255 Ischemic cardiomyopathy: Secondary | ICD-10-CM | POA: Insufficient documentation

## 2021-07-16 DIAGNOSIS — I2510 Atherosclerotic heart disease of native coronary artery without angina pectoris: Secondary | ICD-10-CM | POA: Diagnosis not present

## 2021-07-16 DIAGNOSIS — Z87891 Personal history of nicotine dependence: Secondary | ICD-10-CM | POA: Diagnosis not present

## 2021-07-16 DIAGNOSIS — E1122 Type 2 diabetes mellitus with diabetic chronic kidney disease: Secondary | ICD-10-CM | POA: Insufficient documentation

## 2021-07-16 DIAGNOSIS — I252 Old myocardial infarction: Secondary | ICD-10-CM | POA: Insufficient documentation

## 2021-07-16 DIAGNOSIS — Z01818 Encounter for other preprocedural examination: Secondary | ICD-10-CM | POA: Diagnosis not present

## 2021-07-16 NOTE — Patient Instructions (Signed)
Your physician recommends that you schedule a follow-up appointment in: 9 months (Dec 2023), **PLEASE CALL OUR OFFICE IN October TO SCHEDULE THIS APPOINTMENT ? ?If you have any questions or concerns before your next appointment please send [place] a message through [place] or call our office at [phone].   ? ?TO LEAVE A MESSAGE FOR THE NURSE SELECT OPTION 2, PLEASE LEAVE A MESSAGE INCLUDING: ?YOUR NAME ?DATE OF BIRTH ?CALL BACK NUMBER ?REASON FOR CALL**this is important as we prioritize the call backs ? ?YOU WILL RECEIVE A CALL BACK THE SAME DAY AS LONG AS YOU CALL BEFORE 4:00 PM ? ?At the [facility], you and your health needs are our priority. As part of our continuing mission to provide you with exceptional heart care, we have created designated Provider Care Teams. These Care Teams include your primary Cardiologist (physician) and Advanced Practice Providers (APPs- Physician Assistants and Nurse Practitioners) who all work together to provide you with the care you need, when you need it.  ? ?You may see any of the following providers on your designated Care Team at your next follow up: ?Dr Glori Bickers ?Dr Loralie Champagne ?Darrick Grinder, NP ?Lyda Jester, PA ?Jessica Milford,NP ?Marlyce Huge, PA ?Audry Riles, PharmD ? ? ?Please be sure to bring in all your medications bottles to every appointment.  ? ? ?

## 2021-07-16 NOTE — Progress Notes (Signed)
? ?ADVANCED HF CLINIC NOTE ? ? ?Primary Cardiologist: Dr. Burt Knack ?Primary Care: Dr. Lattie Haw Corrum ?AHF: Dr. Haroldine Laws  ? ?HPI: ?[name] is a 66 y.o. male, former smoker with HTN, CKD IV, CAD s/p large anterolateral STEMI 8/11 and systolic HF. Has [employer] ICD.  ? ?Cath 03/12/2019 ?Total occlusion of the mid LAD treated with angioplasty and stenting using a 4.0 x 22 Onyx deployed at 14 atm.  0% stenosis was noted post procedure and TIMI grade III flow increased from 0 to 3.  The final angiographic images of the LAD demonstrates apical occlusion due to embolus. ?Luminal irregularities in the left main but no agreeable stenosis. ?Large circumflex with 3 obtuse marginals.  The first marginal is large and has a very distal 90% stenosis in 2 branches.  There is a trifurcation distally in the first marginal. ?RCA Moderate diffuse luminal irregularities but widely patent. ?Mid anterior wall to apical akinesis.  EF 30 to 35% acutely.  LVEDP 31 mmHg. ? ?Post cath had low BP and shock but recovered. EF 20% by echo. No significant MR. Creatinine post cath 3.1-3.4. Now followed by nephrology. ? ?Echo 3/21. EF 20-25%. RV normal. ? ?S/P [employer] ICD implant 08/2019  ? ?Admitted 5/21 with large left renal stone. S/P left nephrolithotomy. ? ?Admitted to [facility] 10/22 with hgb 4.6 and SCr up to 4.2.Transfused 4u RBCs. Underwent EGD/colonoscopy 80m x 8 mm prepyloric ulcer wuth prepyloric gastritis and duodenitis. Colonoscopy with single diverticulum in the hepatic flexure and small external hemorrhoids. Started on epogen. Scr 3.3 on d/c on 02/27/21.  ? ?Bidil stopped due to low BP. ? ?Echo 10/22 EF 30-35%  ? ?Today he returns for HF follow up. Overall feeling fine. Main issue it knee OA, had a fall recently. No SOB with activity or walking. Denies abnormal bleeding, palpitations, CP, dizziness, edema, or PND/Orthopnea. Appetite ok. No fever or chills. Weight at home 212-214 pounds. Taking all medications. Requires  lasix 2x/week. Planning on knee replacement when he can arrange for care for his mother (he is her main care taker). No further cocaine use. ? ? ?Review of systems complete and found to be negative unless listed in HPI.   ? ?Past Medical History:  ?Diagnosis Date  ? Acute ST elevation myocardial infarction (STEMI) due to occlusion of mid portion of left anterior descending (LAD) coronary artery ([place] 03/12/2019  ? Acute systolic heart failure ([place]   ? Arthritis   ? CHF (congestive heart failure) ([place]   ? CKD (chronic kidney disease) stage 4, GFR 15-29 ml/min (HCC)   ? Diabetes mellitus, type II ([place]   ? Dyspnea   ? with exertionm periodically  ? Heart attack ([place] 03/15/2019  ? anterior MI   ? History of kidney stones   ? Hyperparathyroidism ([place]   ? Hypertension 2002  ? Ischemic cardiomyopathy   ? Left knee pain   ? Syncope 04/04/2019  ? after blood draw  ? Vitamin D deficiency   ? ?Current Outpatient Medications  ?Medication Sig Dispense Refill  ? allopurinol (ZYLOPRIM) 100 MG tablet Take 100 mg by mouth daily.     ? aspirin 81 MG chewable tablet Chew 1 tablet (81 mg total) by mouth daily.    ? atorvastatin (LIPITOR) 80 MG tablet TAKE 1 TABLET BY MOUTH DAILY AT 6PM 90 tablet 0  ? BRILINTA 60 MG TABS tablet TAKE (1) TABLET BY MOUTH TWICE DAILY. 180 tablet 1  ? carvedilol (COREG) 12.5 MG tablet Take 1 tablet (12.5  mg total) by mouth 2 (two) times daily with a meal. 180 tablet 3  ? Cholecalciferol (VITAMIN D) 50 MCG (2000 UT) CAPS Take 2,000 Units by mouth daily with breakfast.    ? cinacalcet (SENSIPAR) 30 MG tablet Take 30 mg by mouth daily.    ? colchicine 0.6 MG tablet Take 0.6 mg by mouth daily.    ? CORLANOR 5 MG TABS tablet TAKE 1 TABLET BY MOUTH TWICE DAILY WITH A MEAL 60 tablet 1  ? fluticasone (FLONASE) 50 MCG/ACT nasal spray Place into both nostrils as needed for allergies or rhinitis.    ? furosemide (LASIX) 40 MG tablet Take 40 mg by mouth as needed.    ? nitroGLYCERIN (NITROSTAT) 0.4 MG SL tablet  Place 1 tablet (0.4 mg total) under the tongue every 5 (five) minutes as needed. 25 tablet 2  ? oxyCODONE-acetaminophen (PERCOCET) 10-325 MG tablet Take 1 tablet by mouth every 12 (twelve) hours as needed for pain. 60 tablet 0  ? pantoprazole (PROTONIX) 40 MG tablet Take 1 tablet (40 mg total) by mouth 2 (two) times daily before a meal. 60 tablet 5  ? ?No current facility-administered medications for this encounter.  ? ?No Known Allergies ? ?Social History  ? ?Socioeconomic History  ? Marital status: Single  ?  Spouse name: Not on file  ? Number of children: 0  ? Years of education: Not on file  ? Highest education level: Some college, no degree  ?Occupational History  ? Occupation: retired  ?Tobacco Use  ? Smoking status: Former  ?  Packs/day: 0.50  ?  Years: 40.00  ?  Pack years: 20.00  ?  Types: Cigarettes  ?  Quit date: 03/12/2019  ?  Years since quitting: 2.3  ? Smokeless tobacco: Never  ?Vaping Use  ? Vaping Use: Never used  ?Substance and Sexual Activity  ? Alcohol use: No  ? Drug use: No  ? Sexual activity: Not Currently  ?Other Topics Concern  ? Not on file  ?Social History Narrative  ? 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   ? Enjoys: fishing, shopping, working around the house   ?   ? Diet: working on changes, avoiding fried foods, increased veggies  ? Caffeine: teas some daily  ? Water: 6-8 cups daily   ?   ? Wears seat belt   ? Does not use phone while driving   ? Smoke detectors at home   ? ?Social Determinants of Health  ? ?Financial Resource Strain: Low Risk   ? Difficulty of Paying Living Expenses: Not hard at all  ?Food Insecurity: No Food Insecurity  ? Worried About Charity fundraiser in the Last Year: Never true  ? Ran Out of Food in the Last Year: Never true  ?Transportation Needs: No Transportation Needs  ? Lack of Transportation (Medical): No  ? Lack of Transportation (Non-Medical): No  ?Physical Activity: Insufficiently Active  ? Days of Exercise per Week: 7 days  ? Minutes of Exercise  per Session: 10 min  ?Stress: No Stress Concern Present  ? Feeling of Stress : Not at all  ?Social Connections: Unknown  ? Frequency of Communication with Friends and Family: More than three times a week  ? Frequency of Social Gatherings with Friends and Family: More than three times a week  ? Attends Religious Services: More than 4 times per year  ? Active Member of Clubs or Organizations: No  ? Attends Archivist  Meetings: Never  ? Marital Status: Not on file  ?Intimate Partner Violence: Not At Risk  ? Fear of Current or Ex-Partner: No  ? Emotionally Abused: No  ? Physically Abused: No  ? Sexually Abused: No  ? ? ?Family History  ?Problem Relation Age of Onset  ? Heart failure Mother   ? Heart disease Mother   ? Cancer Mother   ?     breast  ? Heart attack Father   ? Hypertension Father   ? Heart disease Father   ? Diabetes Brother   ? ?BP 120/80 (BP Location: Right Arm, Patient Position: Sitting)   Pulse 61   Wt 96.3 kg (212 lb 3.2 oz)   SpO2 99%   BMI 28.78 kg/m?  ? ?Wt Readings from Last 3 Encounters:  ?07/16/21 96.3 kg (212 lb 3.2 oz)  ?07/10/21 95.3 kg (210 lb)  ?07/01/21 95.3 kg (210 lb)  ? ?PHYSICAL EXAM: ?General:  NAD. No resp difficulty ?HEENT: Normal ?Neck: Supple. No JVD. Carotids 2+ bilat; no bruits. No lymphadenopathy or thryomegaly appreciated. ?Cor: PMI nondisplaced. Regular rate & rhythm. No rubs, gallops or murmurs. ?Lungs: Clear ?Abdomen: Soft, nontender, nondistended. No hepatosplenomegaly. No bruits or masses. Good bowel sounds. ?Extremities: No cyanosis, clubbing, rash, edema ?Neuro: Alert & oriented x 3, cranial nerves grossly intact. Moves all 4 extremities w/o difficulty. Affect pleasant. ? ?ICD interrogation: HL Score 6. Thoracic impedence appears stable. No VT/AF, daily activity 1.1 hours. ? ?ASSESSMENT & PLAN: ?1. CAD s/p acute anterolateral STEMI 03/12/19 ?- s/p DES LAD 11/20. ?- No s/s angina. ?- Continue ASA 81 + Brilinta.  ?- Continue atorvastatin 80 mg.  ? ?2. Chronic  Systolic HF ?- EF 41%. Following anteriorlateral stemi ?- Echo 3/21: EF 20-25%. RV ok.  ?- Echo 10/22: EF 30-35% in setting of severe anemia ?- Has [employer] ICD.  ?- NYHA II. Volume status stable.  ?-

## 2021-07-18 DIAGNOSIS — N184 Chronic kidney disease, stage 4 (severe): Secondary | ICD-10-CM | POA: Diagnosis not present

## 2021-07-18 DIAGNOSIS — E210 Primary hyperparathyroidism: Secondary | ICD-10-CM | POA: Diagnosis not present

## 2021-07-18 DIAGNOSIS — E559 Vitamin D deficiency, unspecified: Secondary | ICD-10-CM | POA: Diagnosis not present

## 2021-07-18 DIAGNOSIS — E8722 Chronic metabolic acidosis: Secondary | ICD-10-CM | POA: Diagnosis not present

## 2021-07-21 ENCOUNTER — Other Ambulatory Visit: Payer: Self-pay

## 2021-07-21 ENCOUNTER — Encounter: Payer: Self-pay | Admitting: Internal Medicine

## 2021-07-21 ENCOUNTER — Ambulatory Visit (INDEPENDENT_AMBULATORY_CARE_PROVIDER_SITE_OTHER): Payer: Medicare Other | Admitting: Internal Medicine

## 2021-07-21 DIAGNOSIS — M170 Bilateral primary osteoarthritis of knee: Secondary | ICD-10-CM | POA: Diagnosis not present

## 2021-07-21 DIAGNOSIS — G894 Chronic pain syndrome: Secondary | ICD-10-CM

## 2021-07-21 MED ORDER — OXYCODONE-ACETAMINOPHEN 10-325 MG PO TABS: 1.0000 | ORAL_TABLET | Freq: Two times a day (BID) | ORAL | 0 refills | Status: DC | PRN

## 2021-07-21 NOTE — Progress Notes (Signed)
?  ? ?Virtual Visit via Telephone Note  ? ?This visit type was conducted due to national recommendations for restrictions regarding the COVID-19 Pandemic (e.g. social distancing) in an effort to limit this patient's exposure and mitigate transmission in our community.  Due to his co-morbid illnesses, this patient is at least at moderate risk for complications without adequate follow up.  This format is felt to be most appropriate for this patient at this time.  The patient did not have access to video technology/had technical difficulties with video requiring transitioning to audio format only (telephone).  All issues noted in this document were discussed and addressed.  No physical exam could be performed with this format. ? ?Evaluation Performed:  Follow-up visit ? ?Date:  07/21/2021  ? ?ID:  [name], DOB 02/25/1956, MRN [id number] ? ?Patient Location: Home ?Provider Location: Office/Clinic ? ?Participants: Patient ?Location of Patient: Home ?Location of Provider: Telehealth ?Consent was obtain for visit to be over via telehealth. ?I verified that I am speaking with the correct person using two identifiers. ? ?PCP:  Lindell Spar, MD  ? ?Chief Complaint: B/l knee pain ? ?History of Present Illness:   ? ?[name] is a 66 y.o. male who has a televisit for complaint of chronic bilateral knee pain, for which he has received steroid injections in the past.  He is going to get TKA with orthopedic surgeon, but requests refill of pain medicine for now.  He has history of CKD and HFrEF, due to which he cannot take NSAIDs. ? ?The patient does not have symptoms concerning for COVID-19 infection (fever, chills, cough, or new shortness of breath).  ? ?Past Medical, Surgical, Social History, Allergies, and Medications have been Reviewed. ? ?Past Medical History:  ?Diagnosis Date  ? Acute ST elevation myocardial infarction (STEMI) due to occlusion of mid portion of left anterior descending (LAD) coronary artery  ([place]) 03/12/2019  ? Acute systolic heart failure ([place])   ? Arthritis   ? CHF (congestive heart failure) ([place])   ? CKD (chronic kidney disease) stage 4, GFR 15-29 ml/min (HCC)   ? Diabetes mellitus, type II ([place])   ? Dyspnea   ? with exertionm periodically  ? Heart attack ([place]) 03/15/2019  ? anterior MI   ? History of kidney stones   ? Hyperparathyroidism ([place])   ? Hypertension 2002  ? Ischemic cardiomyopathy   ? Left knee pain   ? Syncope 04/04/2019  ? after blood draw  ? Vitamin D deficiency   ? ?Past Surgical History:  ?Procedure Laterality Date  ? AMPUTATION TOE Left 1991  ? 2 digit of left foot  ? BIOPSY  02/26/2021  ? Procedure: BIOPSY;  Surgeon: Rogene Houston, MD;  [facility];  Service: Endoscopy;;  ? CHOLECYSTECTOMY    ? COLONOSCOPY    ? COLONOSCOPY WITH PROPOFOL N/A 02/26/2021  ? Procedure: COLONOSCOPY WITH PROPOFOL;  Surgeon: Rogene Houston, MD;  [facility];  Service: Endoscopy;  Laterality: N/A;  ? CORONARY/GRAFT ACUTE MI REVASCULARIZATION N/A 03/12/2019  ? Procedure: Coronary/Graft Acute MI Revascularization;  Surgeon: Belva Crome, MD;  Location: [facility];  Service: Cardiovascular;  Laterality: N/A;  ? ESOPHAGOGASTRODUODENOSCOPY (EGD) WITH PROPOFOL N/A 02/26/2021  ? Procedure: ESOPHAGOGASTRODUODENOSCOPY (EGD) WITH PROPOFOL;  Surgeon: Rogene Houston, MD;  [facility];  Service: Endoscopy;  Laterality: N/A;  ? ICD IMPLANT N/A 08/24/2019  ? Procedure: ICD IMPLANT;  Surgeon: Evans Lance, MD;  Location: [facility];  Service: Cardiovascular;  Laterality: N/A;  ? IR URETERAL STENT LEFT NEW ACCESS W/O SEP NEPHROSTOMY CATH  09/26/2019  ? LEFT HEART CATH AND CORONARY ANGIOGRAPHY N/A 03/12/2019  ? Procedure: LEFT HEART CATH AND CORONARY ANGIOGRAPHY;  Surgeon: Belva Crome, MD;  Location: [facility];  Service: Cardiovascular;  Laterality: N/A;  ? LITHOTRIPSY Right   ? NEPHROLITHOTOMY Left 09/26/2019  ? Procedure: LEFT NEPHROLITHOTOMY  PERCUTANEOUS;  Surgeon: Irine Seal, MD;  [facility];  Service: Urology;  Laterality: Left;  ?  ? ?Current Meds  ?Medication Sig  ? allopurinol (ZYLOPRIM) 100 MG tablet Take 100 mg by mouth daily.   ? aspirin 81 MG chewable tablet Chew 1 tablet (81 mg total) by mouth daily.  ? atorvastatin (LIPITOR) 80 MG tablet TAKE 1 TABLET BY MOUTH DAILY AT 6PM  ? BRILINTA 60 MG TABS tablet TAKE (1) TABLET BY MOUTH TWICE DAILY.  ? carvedilol (COREG) 12.5 MG tablet Take 1 tablet (12.5 mg total) by mouth 2 (two) times daily with a meal.  ? Cholecalciferol (VITAMIN D) 50 MCG (2000 UT) CAPS Take 2,000 Units by mouth daily with breakfast.  ? cinacalcet (SENSIPAR) 30 MG tablet Take 30 mg by mouth daily.  ? colchicine 0.6 MG tablet Take 0.6 mg by mouth daily.  ? CORLANOR 5 MG TABS tablet TAKE 1 TABLET BY MOUTH TWICE DAILY WITH A MEAL  ? fluticasone (FLONASE) 50 MCG/ACT nasal spray Place into both nostrils as needed for allergies or rhinitis.  ? furosemide (LASIX) 40 MG tablet Take 40 mg by mouth as needed.  ? nitroGLYCERIN (NITROSTAT) 0.4 MG SL tablet Place 1 tablet (0.4 mg total) under the tongue every 5 (five) minutes as needed.  ? pantoprazole (PROTONIX) 40 MG tablet Take 1 tablet (40 mg total) by mouth 2 (two) times daily before a meal.  ? [DISCONTINUED] oxyCODONE-acetaminophen (PERCOCET) 10-325 MG tablet Take 1 tablet by mouth every 12 (twelve) hours as needed for pain.  ?  ? ?Allergies:   Patient has no known allergies.  ? ?ROS:   ?Please see the history of present illness.    ? ?All other systems reviewed and are negative. ? ? ?Labs/Other Tests and Data Reviewed:   ? ?Recent Labs: ?08/28/2020: TSH 0.993 ?02/22/2021: ALT 11; B Natriuretic Peptide 157.0 ?03/13/2021: Hemoglobin 9.2; Platelets 254 ?03/18/2021: BUN 26; Creatinine, Ser 3.48; Potassium 4.2; Sodium 139  ? ?Recent Lipid Panel ?Lab Results  ?Component Value Date/Time  ? CHOL 126 08/28/2020 10:09 AM  ? TRIG 118 08/28/2020 10:09 AM  ? HDL 52 08/28/2020 10:09 AM  ?  CHOLHDL 2.4 08/28/2020 10:09 AM  ? CHOLHDL 2.3 02/15/2020 09:30 AM  ? LDLCALC 53 08/28/2020 10:09 AM  ? ? ?Wt Readings from Last 3 Encounters:  ?07/16/21 212 lb 3.2 oz (96.3 kg)  ?07/10/21 210 lb (95.3 kg)  ?07/01/21 210 lb (95.3 kg)  ?  ? ?ASSESSMENT & PLAN:   ? ?Primary osteoarthritis of both knees ?Has seen Orthopedic surgery, planning to get TKA ?Has had steroid injection in the past ?Unable to take NSAIDs due to CKD and CAD ?Followed by pain management/PMNR ?On Percocet currently, advised to take it BID PRN and avoid TID dose ? ? ? ?Time:   ?Today, I have spent 12 minutes reviewing the chart, including problem list, medications, and with the patient with telehealth technology discussing the above problems. ? ? ?Medication Adjustments/Labs and Tests Ordered: ?Current medicines are reviewed at length with the patient today.  Concerns regarding  medicines are outlined above.  ? ?Tests Ordered: ?No orders of the defined types were placed in this encounter. ? ? ?Medication Changes: ?Meds ordered this encounter  ?Medications  ? oxyCODONE-acetaminophen (PERCOCET) 10-325 MG tablet  ?  Sig: Take 1 tablet by mouth every 12 (twelve) hours as needed for pain.  ?  Dispense:  60 tablet  ?  Refill:  0  ? ? ? ?Note: This dictation was prepared with Dragon dictation along with smaller phrase technology. Similar sounding words can be transcribed inadequately or may not be corrected upon review. Any transcriptional errors that result from this process are unintentional.  ?  ? ? ?Disposition:  Follow up  ?Signed, ?Lindell Spar, MD  ?07/21/2021 12:08 PM    ? ?[facility] ?[facility] ?

## 2021-07-21 NOTE — Patient Instructions (Signed)
Please continue taking medications as prescribed.

## 2021-07-21 NOTE — Assessment & Plan Note (Addendum)
Has seen Orthopedic surgery, planning to get TKA ?Has had steroid injection in the past ?Unable to take NSAIDs due to CKD and CAD ?Followed by pain management/PMNR ?On Percocet currently, advised to take it BID PRN and avoid TID dose ?

## 2021-07-23 DIAGNOSIS — Z131 Encounter for screening for diabetes mellitus: Secondary | ICD-10-CM | POA: Diagnosis not present

## 2021-07-23 DIAGNOSIS — R030 Elevated blood-pressure reading, without diagnosis of hypertension: Secondary | ICD-10-CM | POA: Diagnosis not present

## 2021-07-23 DIAGNOSIS — M25562 Pain in left knee: Secondary | ICD-10-CM | POA: Diagnosis not present

## 2021-07-23 DIAGNOSIS — M179 Osteoarthritis of knee, unspecified: Secondary | ICD-10-CM | POA: Diagnosis not present

## 2021-07-23 DIAGNOSIS — N189 Chronic kidney disease, unspecified: Secondary | ICD-10-CM | POA: Diagnosis not present

## 2021-07-23 DIAGNOSIS — Z8679 Personal history of other diseases of the circulatory system: Secondary | ICD-10-CM | POA: Diagnosis not present

## 2021-07-23 DIAGNOSIS — Z0000 Encounter for general adult medical examination without abnormal findings: Secondary | ICD-10-CM | POA: Diagnosis not present

## 2021-07-23 DIAGNOSIS — Z1159 Encounter for screening for other viral diseases: Secondary | ICD-10-CM | POA: Diagnosis not present

## 2021-07-23 DIAGNOSIS — M25561 Pain in right knee: Secondary | ICD-10-CM | POA: Diagnosis not present

## 2021-07-23 DIAGNOSIS — E559 Vitamin D deficiency, unspecified: Secondary | ICD-10-CM | POA: Diagnosis not present

## 2021-07-23 DIAGNOSIS — Z79899 Other long term (current) drug therapy: Secondary | ICD-10-CM | POA: Diagnosis not present

## 2021-07-28 ENCOUNTER — Other Ambulatory Visit: Payer: Self-pay | Admitting: Internal Medicine

## 2021-07-29 ENCOUNTER — Ambulatory Visit (INDEPENDENT_AMBULATORY_CARE_PROVIDER_SITE_OTHER): Payer: Medicare Other | Admitting: *Deleted

## 2021-07-29 DIAGNOSIS — I2510 Atherosclerotic heart disease of native coronary artery without angina pectoris: Secondary | ICD-10-CM

## 2021-07-29 DIAGNOSIS — I5022 Chronic systolic (congestive) heart failure: Secondary | ICD-10-CM

## 2021-07-29 NOTE — Chronic Care Management (AMB) (Signed)
? ?  07/29/2021 ? ?[name] ?07/10/1955 ?[id number] ? ? ?Telephone call to patient for follow up, spoke with patient who reports he wishes to be discharged from [facility] program. ? ?Case closed ? ?Jacqlyn Larsen RNC, BSN ?RN Case Manager ?[place] ?[phone] ? ?

## 2021-07-30 DIAGNOSIS — N170 Acute kidney failure with tubular necrosis: Secondary | ICD-10-CM | POA: Diagnosis not present

## 2021-07-30 DIAGNOSIS — D638 Anemia in other chronic diseases classified elsewhere: Secondary | ICD-10-CM | POA: Diagnosis not present

## 2021-07-30 DIAGNOSIS — N184 Chronic kidney disease, stage 4 (severe): Secondary | ICD-10-CM | POA: Diagnosis not present

## 2021-07-30 DIAGNOSIS — E210 Primary hyperparathyroidism: Secondary | ICD-10-CM | POA: Diagnosis not present

## 2021-07-30 DIAGNOSIS — I5022 Chronic systolic (congestive) heart failure: Secondary | ICD-10-CM | POA: Diagnosis not present

## 2021-07-30 DIAGNOSIS — I129 Hypertensive chronic kidney disease with stage 1 through stage 4 chronic kidney disease, or unspecified chronic kidney disease: Secondary | ICD-10-CM | POA: Diagnosis not present

## 2021-08-01 DIAGNOSIS — I2510 Atherosclerotic heart disease of native coronary artery without angina pectoris: Secondary | ICD-10-CM

## 2021-08-01 DIAGNOSIS — I5022 Chronic systolic (congestive) heart failure: Secondary | ICD-10-CM

## 2021-08-04 ENCOUNTER — Other Ambulatory Visit: Payer: Self-pay | Admitting: Internal Medicine

## 2021-08-04 ENCOUNTER — Other Ambulatory Visit: Payer: Self-pay | Admitting: Family Medicine

## 2021-08-07 DIAGNOSIS — R030 Elevated blood-pressure reading, without diagnosis of hypertension: Secondary | ICD-10-CM | POA: Diagnosis not present

## 2021-08-07 DIAGNOSIS — Z79899 Other long term (current) drug therapy: Secondary | ICD-10-CM | POA: Diagnosis not present

## 2021-08-07 DIAGNOSIS — N189 Chronic kidney disease, unspecified: Secondary | ICD-10-CM | POA: Diagnosis not present

## 2021-08-07 DIAGNOSIS — R748 Abnormal levels of other serum enzymes: Secondary | ICD-10-CM | POA: Diagnosis not present

## 2021-08-15 ENCOUNTER — Telehealth: Payer: Self-pay

## 2021-08-15 DIAGNOSIS — M21162 Varus deformity, not elsewhere classified, left knee: Secondary | ICD-10-CM | POA: Diagnosis not present

## 2021-08-15 DIAGNOSIS — M170 Bilateral primary osteoarthritis of knee: Secondary | ICD-10-CM | POA: Diagnosis not present

## 2021-08-15 DIAGNOSIS — M21161 Varus deformity, not elsewhere classified, right knee: Secondary | ICD-10-CM | POA: Diagnosis not present

## 2021-08-15 NOTE — Telephone Encounter (Signed)
Patient dropped off sports medicine and join replacement form for medical clearance form appt for 05.17.2023 to fill out. FORMS IN FRONT FOLDER ?

## 2021-08-21 ENCOUNTER — Telehealth: Payer: Self-pay

## 2021-08-21 ENCOUNTER — Other Ambulatory Visit: Payer: Self-pay | Admitting: Internal Medicine

## 2021-08-21 DIAGNOSIS — M170 Bilateral primary osteoarthritis of knee: Secondary | ICD-10-CM

## 2021-08-21 DIAGNOSIS — G894 Chronic pain syndrome: Secondary | ICD-10-CM

## 2021-08-21 MED ORDER — OXYCODONE-ACETAMINOPHEN 10-325 MG PO TABS: 1.0000 | ORAL_TABLET | Freq: Two times a day (BID) | ORAL | 0 refills | Status: DC | PRN

## 2021-08-21 NOTE — Telephone Encounter (Signed)
Pt requesting refill on meds - please call when ready - oxyCODONE-acetaminophen (PERCOCET) 10-325 MG tablet ? ?

## 2021-08-22 ENCOUNTER — Ambulatory Visit (INDEPENDENT_AMBULATORY_CARE_PROVIDER_SITE_OTHER): Payer: Medicare Other

## 2021-08-22 DIAGNOSIS — I255 Ischemic cardiomyopathy: Secondary | ICD-10-CM

## 2021-08-25 ENCOUNTER — Ambulatory Visit: Payer: Medicare Other | Admitting: Internal Medicine

## 2021-08-26 LAB — CUP PACEART REMOTE DEVICE CHECK
Battery Remaining Longevity: 162 mo
Battery Remaining Percentage: 100 %
Brady Statistic RV Percent Paced: 0 %
Date Time Interrogation Session: 20230421044000
HighPow Impedance: 70 Ohm
Implantable Lead Implant Date: 20210422
Implantable Lead Location: 753860
Implantable Lead Model: 138
Implantable Lead Serial Number: 302700
Implantable Pulse Generator Implant Date: 20210422
Lead Channel Impedance Value: 440 Ohm
Lead Channel Setting Pacing Amplitude: 2.5 V
Lead Channel Setting Pacing Pulse Width: 0.4 ms
Lead Channel Setting Sensing Sensitivity: 0.5 mV
Pulse Gen Serial Number: 209495

## 2021-08-28 DIAGNOSIS — R748 Abnormal levels of other serum enzymes: Secondary | ICD-10-CM | POA: Diagnosis not present

## 2021-08-28 DIAGNOSIS — N189 Chronic kidney disease, unspecified: Secondary | ICD-10-CM | POA: Diagnosis not present

## 2021-08-28 DIAGNOSIS — R030 Elevated blood-pressure reading, without diagnosis of hypertension: Secondary | ICD-10-CM | POA: Diagnosis not present

## 2021-08-28 DIAGNOSIS — Z79899 Other long term (current) drug therapy: Secondary | ICD-10-CM | POA: Diagnosis not present

## 2021-09-01 ENCOUNTER — Ambulatory Visit: Payer: Medicare Other | Admitting: Internal Medicine

## 2021-09-08 NOTE — Progress Notes (Signed)
Remote ICD transmission.   

## 2021-09-15 ENCOUNTER — Other Ambulatory Visit: Payer: Self-pay | Admitting: Internal Medicine

## 2021-09-15 DIAGNOSIS — G894 Chronic pain syndrome: Secondary | ICD-10-CM

## 2021-09-15 DIAGNOSIS — M170 Bilateral primary osteoarthritis of knee: Secondary | ICD-10-CM

## 2021-09-17 ENCOUNTER — Ambulatory Visit (INDEPENDENT_AMBULATORY_CARE_PROVIDER_SITE_OTHER): Payer: Medicare Other | Admitting: Internal Medicine

## 2021-09-17 ENCOUNTER — Encounter: Payer: Self-pay | Admitting: Internal Medicine

## 2021-09-17 VITALS — BP 138/88 | HR 61 | Resp 18 | Ht 72.0 in | Wt 217.4 lb

## 2021-09-17 DIAGNOSIS — I2510 Atherosclerotic heart disease of native coronary artery without angina pectoris: Secondary | ICD-10-CM | POA: Diagnosis not present

## 2021-09-17 DIAGNOSIS — I10 Essential (primary) hypertension: Secondary | ICD-10-CM | POA: Diagnosis not present

## 2021-09-17 DIAGNOSIS — N184 Chronic kidney disease, stage 4 (severe): Secondary | ICD-10-CM | POA: Diagnosis not present

## 2021-09-17 DIAGNOSIS — M170 Bilateral primary osteoarthritis of knee: Secondary | ICD-10-CM | POA: Diagnosis not present

## 2021-09-17 DIAGNOSIS — E1121 Type 2 diabetes mellitus with diabetic nephropathy: Secondary | ICD-10-CM | POA: Diagnosis not present

## 2021-09-17 DIAGNOSIS — Z0001 Encounter for general adult medical examination with abnormal findings: Secondary | ICD-10-CM | POA: Diagnosis not present

## 2021-09-17 LAB — POCT GLYCOSYLATED HEMOGLOBIN (HGB A1C)
HbA1c, POC (controlled diabetic range): 5.8 % (ref 0.0–7.0)
HbA1c, POC (prediabetic range): 5.8 % (ref 5.7–6.4)

## 2021-09-17 NOTE — Assessment & Plan Note (Signed)
Lab Results  ?Component Value Date  ? HGBA1C 5.8 09/17/2021  ? HGBA1C 5.8 09/17/2021  ? ?Diet controlled ?On statin ?

## 2021-09-17 NOTE — Progress Notes (Signed)
? ?Established Patient Office Visit ? ?Subjective:  ?Patient ID: [name], male    DOB: 02-13-1956  Age: 66 y.o. MRN: [id number] ? ?CC:  ?Chief Complaint  ?Patient presents with  ? Follow-up  ?  Pt having knee surgery left knee coming up here for surgery clearance  ? Annual Exam  ? ? ?HPI ?[name] is a 66 y.o. male with past medical history of CAD s/p CABG, ischemic cardiomyopathy, CKD and OA who presents for annual physical. ? ?He is planned to get left TKA for OA of knee.  He is currently taking Percocet as needed for severe knee pain.  He is not able to take oral NSAIDs due to his history of CKD and CAD. ? ?He is currently on DAPT and statin for CAD. BP is well-controlled. Takes medications regularly. Patient denies headache, dizziness, chest pain, dyspnea or palpitations. ? ? ? ? ?Past Medical History:  ?Diagnosis Date  ? Acute ST elevation myocardial infarction (STEMI) due to occlusion of mid portion of left anterior descending (LAD) coronary artery ([place]) 03/12/2019  ? Acute systolic heart failure ([place])   ? Arthritis   ? CHF (congestive heart failure) ([place])   ? CKD (chronic kidney disease) stage 4, GFR 15-29 ml/min (HCC)   ? Diabetes mellitus, type II ([place])   ? Dyspnea   ? with exertionm periodically  ? Heart attack ([place]) 03/15/2019  ? anterior MI   ? History of kidney stones   ? Hyperparathyroidism ([place])   ? Hypertension 2002  ? Ischemic cardiomyopathy   ? Left knee pain   ? Syncope 04/04/2019  ? after blood draw  ? Vitamin D deficiency   ? ? ?Past Surgical History:  ?Procedure Laterality Date  ? AMPUTATION TOE Left 1991  ? 2 digit of left foot  ? BIOPSY  02/26/2021  ? Procedure: BIOPSY;  Surgeon: Rogene Houston, MD;  [facility];  Service: Endoscopy;;  ? CHOLECYSTECTOMY    ? COLONOSCOPY    ? COLONOSCOPY WITH PROPOFOL N/A 02/26/2021  ? Procedure: COLONOSCOPY WITH PROPOFOL;  Surgeon: Rogene Houston, MD;  [facility];  Service: Endoscopy;  Laterality: N/A;  ?  CORONARY/GRAFT ACUTE MI REVASCULARIZATION N/A 03/12/2019  ? Procedure: Coronary/Graft Acute MI Revascularization;  Surgeon: Belva Crome, MD;  Location: [place] [facility];  Service: Cardiovascular;  Laterality: N/A;  ? ESOPHAGOGASTRODUODENOSCOPY (EGD) WITH PROPOFOL N/A 02/26/2021  ? Procedure: ESOPHAGOGASTRODUODENOSCOPY (EGD) WITH PROPOFOL;  Surgeon: Rogene Houston, MD;  [facility];  Service: Endoscopy;  Laterality: N/A;  ? ICD IMPLANT N/A 08/24/2019  ? Procedure: ICD IMPLANT;  Surgeon: Evans Lance, MD;  Location: [place] [facility];  Service: Cardiovascular;  Laterality: N/A;  ? IR URETERAL STENT LEFT NEW ACCESS W/O SEP NEPHROSTOMY CATH  09/26/2019  ? LEFT HEART CATH AND CORONARY ANGIOGRAPHY N/A 03/12/2019  ? Procedure: LEFT HEART CATH AND CORONARY ANGIOGRAPHY;  Surgeon: Belva Crome, MD;  Location: [facility];  Service: Cardiovascular;  Laterality: N/A;  ? LITHOTRIPSY Right   ? NEPHROLITHOTOMY Left 09/26/2019  ? Procedure: LEFT NEPHROLITHOTOMY PERCUTANEOUS;  Surgeon: Irine Seal, MD;  [facility];  Service: Urology;  Laterality: Left;  ? ? ?Family History  ?Problem Relation Age of Onset  ? Heart failure Mother   ? Heart disease Mother   ? Cancer Mother   ?     breast  ? Heart attack Father   ? Hypertension Father   ? Heart disease Father   ?  Diabetes Brother   ? ? ?Social History  ? ?Socioeconomic History  ? Marital status: Single  ?  Spouse name: Not on file  ? Number of children: 0  ? Years of education: Not on file  ? Highest education level: Some college, no degree  ?Occupational History  ? Occupation: retired  ?Tobacco Use  ? Smoking status: Former  ?  Packs/day: 0.50  ?  Years: 40.00  ?  Pack years: 20.00  ?  Types: Cigarettes  ?  Quit date: 03/12/2019  ?  Years since quitting: 2.5  ? Smokeless tobacco: Never  ?Vaping Use  ? Vaping Use: Never used  ?Substance and Sexual Activity  ? Alcohol use: No  ? Drug use: No  ? Sexual activity: Not Currently  ?Other Topics Concern  ? Not  on file  ?Social History Narrative  ? 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   ? Enjoys: fishing, shopping, working around the house   ?   ? Diet: working on changes, avoiding fried foods, increased veggies  ? Caffeine: teas some daily  ? Water: 6-8 cups daily   ?   ? Wears seat belt   ? Does not use phone while driving   ? Smoke detectors at home   ? ?Social Determinants of Health  ? ?Financial Resource Strain: Low Risk   ? Difficulty of Paying Living Expenses: Not hard at all  ?Food Insecurity: No Food Insecurity  ? Worried About Charity fundraiser in the Last Year: Never true  ? Ran Out of Food in the Last Year: Never true  ?Transportation Needs: No Transportation Needs  ? Lack of Transportation (Medical): No  ? Lack of Transportation (Non-Medical): No  ?Physical Activity: Insufficiently Active  ? Days of Exercise per Week: 7 days  ? Minutes of Exercise per Session: 10 min  ?Stress: No Stress Concern Present  ? Feeling of Stress : Not at all  ?Social Connections: Unknown  ? Frequency of Communication with Friends and Family: More than three times a week  ? Frequency of Social Gatherings with Friends and Family: More than three times a week  ? Attends Religious Services: More than 4 times per year  ? Active Member of Clubs or Organizations: No  ? Attends Archivist Meetings: Never  ? Marital Status: Not on file  ?Intimate Partner Violence: Not At Risk  ? Fear of Current or Ex-Partner: No  ? Emotionally Abused: No  ? Physically Abused: No  ? Sexually Abused: No  ? ? ?Outpatient Medications Prior to Visit  ?Medication Sig Dispense Refill  ? allopurinol (ZYLOPRIM) 100 MG tablet Take 100 mg by mouth daily.     ? aspirin 81 MG chewable tablet Chew 1 tablet (81 mg total) by mouth daily.    ? atorvastatin (LIPITOR) 80 MG tablet TAKE 1 TABLET BY MOUTH DAILY AT 6PM 90 tablet 0  ? BRILINTA 60 MG TABS tablet TAKE (1) TABLET BY MOUTH TWICE DAILY. 180 tablet 1  ? carvedilol (COREG) 12.5 MG tablet Take 1 tablet  (12.5 mg total) by mouth 2 (two) times daily with a meal. 180 tablet 3  ? Cholecalciferol (VITAMIN D) 50 MCG (2000 UT) CAPS Take 2,000 Units by mouth daily with breakfast.    ? cinacalcet (SENSIPAR) 30 MG tablet Take 30 mg by mouth daily.    ? colchicine 0.6 MG tablet Take 0.6 mg by mouth daily.    ? CORLANOR 5 MG TABS tablet  TAKE 1 TABLET BY MOUTH TWICE DAILY WITH A MEAL 180 tablet 2  ? fluticasone (FLONASE) 50 MCG/ACT nasal spray Place into both nostrils as needed for allergies or rhinitis.    ? furosemide (LASIX) 40 MG tablet Take 40 mg by mouth as needed.    ? nitroGLYCERIN (NITROSTAT) 0.4 MG SL tablet Place 1 tablet (0.4 mg total) under the tongue every 5 (five) minutes as needed. 25 tablet 2  ? [START ON 09/23/2021] oxyCODONE-acetaminophen (PERCOCET) 10-325 MG tablet TAKE 1 TABLET BY MOUTH EVERY 12 HOURS AS NEEDED FOR PAIN. 60 tablet 0  ? pantoprazole (PROTONIX) 40 MG tablet Take 1 tablet (40 mg total) by mouth 2 (two) times daily before a meal. 60 tablet 5  ? ?No facility-administered medications prior to visit.  ? ? ?No Known Allergies ? ?ROS ?Review of Systems  ?Constitutional:  Negative for chills and fever.  ?HENT:  Negative for ear discharge and sore throat.   ?Eyes:  Negative for pain and discharge.  ?Respiratory:  Negative for cough and shortness of breath.   ?Cardiovascular:  Negative for chest pain and palpitations.  ?Gastrointestinal:  Negative for constipation, diarrhea, nausea and vomiting.  ?Endocrine: Negative for polydipsia and polyuria.  ?Genitourinary:  Negative for dysuria and hematuria.  ?Musculoskeletal:  Positive for arthralgias and back pain. Negative for neck pain and neck stiffness.  ?Skin:  Negative for rash.  ?Neurological:  Negative for dizziness, weakness and numbness.  ?Psychiatric/Behavioral:  Negative for agitation and behavioral problems.   ? ?  ?Objective:  ?  ?Physical Exam ?Vitals reviewed.  ?Constitutional:   ?   General: He is not in acute distress. ?   Appearance: He is  not diaphoretic.  ?HENT:  ?   Head: Normocephalic and atraumatic.  ?   Right Ear: There is no impacted cerumen.  ?   Nose: Nose normal.  ?   Mouth/Throat:  ?   Mouth: Mucous membranes are moist.  ?Eyes:  ?   Gener

## 2021-09-17 NOTE — Assessment & Plan Note (Signed)
Progressive CKD stage 4 ?Follows up with Nephrology ?On Sensipar and sodium bicarb tablets ?Avoid nephrotoxic agents, including NSAIDs ?

## 2021-09-17 NOTE — Assessment & Plan Note (Signed)
BP Readings from Last 1 Encounters:  ?09/17/21 138/88  ? ?Well-controlled, followed by Nephrology ?Counseled for compliance with the medications ?Advised DASH diet and ambulation as tolerated ?

## 2021-09-17 NOTE — Patient Instructions (Signed)
Please continue taking medications as prescribed.  Please continue to follow low salt diet and ambulate as tolerated. 

## 2021-09-17 NOTE — Assessment & Plan Note (Signed)

## 2021-09-17 NOTE — Assessment & Plan Note (Signed)
H/o STEMI, s/p CABG ?On Aspirin, statin and B-blocker ?No active chest pain ?Followed by Cardiology ?

## 2021-09-17 NOTE — Assessment & Plan Note (Signed)
Has seen Orthopedic surgery, planning to get TKA ?Has had steroid injection in the past ?Unable to take NSAIDs due to CKD and CAD ?On Percocet currently, advised to take it BID PRN and avoid TID dose ? ?Preop exam: ?Medically optimized with moderate risk for left TKA ?Needs to clarify with Cardiology about antiplatelets ?

## 2021-09-18 ENCOUNTER — Encounter: Payer: Self-pay | Admitting: *Deleted

## 2021-09-24 ENCOUNTER — Ambulatory Visit
Admission: EM | Admit: 2021-09-24 | Discharge: 2021-09-24 | Disposition: A | Discharge status: Home / Self Care | Payer: Medicare Other | Attending: Family Medicine | Admitting: Family Medicine

## 2021-09-24 DIAGNOSIS — K068 Other specified disorders of gingiva and edentulous alveolar ridge: Secondary | ICD-10-CM

## 2021-09-24 MED ORDER — LIDOCAINE VISCOUS HCL 2 % MT SOLN: 10.0000 mL | OROMUCOSAL | 0 refills | Status: DC | PRN

## 2021-09-24 MED ORDER — KETOROLAC TROMETHAMINE 30 MG/ML IJ SOLN
30.0000 mg | Freq: Once | INTRAMUSCULAR | Status: AC
  Administered 2021-09-24: 30 mg via INTRAMUSCULAR

## 2021-09-24 MED ORDER — AMOXICILLIN-POT CLAVULANATE 875-125 MG PO TABS: 1.0000 | ORAL_TABLET | Freq: Two times a day (BID) | ORAL | 0 refills | Status: DC

## 2021-09-24 MED ORDER — CHLORHEXIDINE GLUCONATE 0.12 % MT SOLN: 15.0000 mL | Freq: Two times a day (BID) | OROMUCOSAL | 0 refills | Status: DC

## 2021-09-24 NOTE — ED Provider Notes (Signed)
[facility]    CSN: [id number] Arrival date & time: 09/24/21  0900      History   Chief Complaint Chief Complaint  Patient presents with   Jaw Pain    Right side jaw pain and gum pain    HPI [name] is a 66 y.o. male.   Presenting today with almost a week of severe sharp shooting pains in his right lower gums.  Denies significant swelling or COVID notice of draining or bleeding from the area, injury to the area, fever, chills, facial swelling.  Had his lower teeth removed years ago with no problems since.  Trying Campho-Phenique with no relief.  Tried to get in with his dentist but waiting for callback.   Past Medical History:  Diagnosis Date   Acute ST elevation myocardial infarction (STEMI) due to occlusion of mid portion of left anterior descending (LAD) coronary artery ([place]) 76/73/4193   Acute systolic heart failure (HCC)    Arthritis    CHF (congestive heart failure) (HCC)    CKD (chronic kidney disease) stage 4, GFR 15-29 ml/min (HCC)    Diabetes mellitus, type II ([place])    Dyspnea    with exertionm periodically   Heart attack ([place]) 03/15/2019   anterior MI    History of kidney stones    Hyperparathyroidism ([place])    Hypertension 2002   Ischemic cardiomyopathy    Left knee pain    Syncope 04/04/2019   after blood draw   Vitamin D deficiency     Patient Active Problem List   Diagnosis Date Noted   Encounter for general adult medical examination with abnormal findings 09/17/2021   Other hyperparathyroidism ([place]) 06/13/2021   Vitamin D deficiency 06/13/2021   Nonintractable episodic headache 04/23/2021   Primary osteoarthritis of both knees 04/23/2021   Anemia due to chronic blood loss 03/05/2021   Gastric ulcer 03/05/2021   Type 2 diabetes with nephropathy ([place])    LUQ pain    Prediabetes 02/22/2021   Left renal stone 09/26/2019   CAD in native artery    Ischemic cardiomyopathy 08/15/2019   Congestive heart failure ([place]) 03/21/2019    ST elevation myocardial infarction involving left anterior descending (LAD) coronary artery (HCC)    Chronic kidney disease, stage IV (severe) ([place]) 06/11/2016   Elevated PSA 06/11/2016   Hyperlipidemia 06/11/2016   Essential hypertension 05/28/2016    Past Surgical History:  Procedure Laterality Date   AMPUTATION TOE Left 1991   2 digit of left foot   BIOPSY  02/26/2021   Procedure: BIOPSY;  Surgeon: Rogene Houston, MD;  Location: [facility];  Service: Endoscopy;;   CHOLECYSTECTOMY     COLONOSCOPY     COLONOSCOPY WITH PROPOFOL N/A 02/26/2021   Procedure: COLONOSCOPY WITH PROPOFOL;  Surgeon: Rogene Houston, MD;  Location: [facility];  Service: Endoscopy;  Laterality: N/A;   CORONARY/GRAFT ACUTE MI REVASCULARIZATION N/A 03/12/2019   Procedure: Coronary/Graft Acute MI Revascularization;  Surgeon: Belva Crome, MD;  Location: [place] [facility];  Service: Cardiovascular;  Laterality: N/A;   ESOPHAGOGASTRODUODENOSCOPY (EGD) WITH PROPOFOL N/A 02/26/2021   Procedure: ESOPHAGOGASTRODUODENOSCOPY (EGD) WITH PROPOFOL;  Surgeon: Rogene Houston, MD;  Location: [facility];  Service: Endoscopy;  Laterality: N/A;   ICD IMPLANT N/A 08/24/2019   Procedure: ICD IMPLANT;  Surgeon: Evans Lance, MD;  Location: [place] [facility];  Service: Cardiovascular;  Laterality: N/A;   IR URETERAL STENT LEFT NEW ACCESS W/O SEP NEPHROSTOMY CATH  09/26/2019   LEFT HEART CATH AND CORONARY ANGIOGRAPHY N/A 03/12/2019   Procedure: LEFT HEART CATH AND CORONARY ANGIOGRAPHY;  Surgeon: Belva Crome, MD;  Location: [place] [facility];  Service: Cardiovascular;  Laterality: N/A;   LITHOTRIPSY Right    NEPHROLITHOTOMY Left 09/26/2019   Procedure: LEFT NEPHROLITHOTOMY PERCUTANEOUS;  Surgeon: Irine Seal, MD;  Location: [facility];  Service: Urology;  Laterality: Left;       Home Medications    Prior to Admission medications   Medication Sig Start Date End Date Taking? Authorizing Provider   amoxicillin-clavulanate (AUGMENTIN) 875-125 MG tablet Take 1 tablet by mouth every 12 (twelve) hours. 09/24/21  Yes Volney American, PA-C  chlorhexidine (PERIDEX) 0.12 % solution Use as directed 15 mLs in the mouth or throat 2 (two) times daily. 09/24/21  Yes Volney American, PA-C  lidocaine (XYLOCAINE) 2 % solution Use as directed 10 mLs in the mouth or throat every 3 (three) hours as needed for mouth pain. 09/24/21  Yes Volney American, PA-C  allopurinol (ZYLOPRIM) 100 MG tablet Take 100 mg by mouth daily.  04/25/19 03/18/22  [provider]  aspirin 81 MG chewable tablet Chew 1 tablet (81 mg total) by mouth daily. 03/04/21   Barton Dubois, MD  atorvastatin (LIPITOR) 80 MG tablet TAKE 1 TABLET BY MOUTH DAILY AT [facility] 08/05/21   Lindell Spar, MD  BRILINTA 60 MG TABS tablet TAKE (1) TABLET BY MOUTH TWICE DAILY. 06/05/21   Bensimhon, Shaune Pascal, MD  carvedilol (COREG) 12.5 MG tablet Take 1 tablet (12.5 mg total) by mouth 2 (two) times daily with a meal. 06/04/21   Bensimhon, Shaune Pascal, MD  Cholecalciferol (VITAMIN D) 50 MCG (2000 UT) CAPS Take 2,000 Units by mouth daily with breakfast.    [provider]  cinacalcet (SENSIPAR) 30 MG tablet Take 30 mg by mouth daily. 12/28/20   [provider]  colchicine 0.6 MG tablet Take 0.6 mg by mouth daily. 05/15/20   [provider]  CORLANOR 5 MG TABS tablet TAKE 1 TABLET BY MOUTH TWICE DAILY WITH A MEAL 08/05/21   Evans Lance, MD  fluticasone [facility]) 50 MCG/ACT nasal spray Place into both nostrils as needed for allergies or rhinitis.    [provider]  furosemide (LASIX) 40 MG tablet Take 40 mg by mouth as needed.    [provider]  nitroGLYCERIN (NITROSTAT) 0.4 MG SL tablet Place 1 tablet (0.4 mg total) under the tongue every 5 (five) minutes as needed. 04/04/21   Lindell Spar, MD  oxyCODONE-acetaminophen (PERCOCET) 10-325 MG tablet TAKE 1 TABLET BY MOUTH EVERY 12 HOURS AS NEEDED FOR PAIN.  09/23/21   Lindell Spar, MD  pantoprazole (PROTONIX) 40 MG tablet Take 1 tablet (40 mg total) by mouth 2 (two) times daily before a meal. 03/05/21 09/01/21  Eloise Harman, DO    Family History Family History  Problem Relation Age of Onset   Heart failure Mother    Heart disease Mother    Cancer Mother        breast   Heart attack Father    Hypertension Father    Heart disease Father    Diabetes Brother     Social History Social History   Tobacco Use   Smoking status: Former    Packs/day: 0.50    Years: 40.00    Pack years: 20.00    Types: Cigarettes    Quit date: 03/12/2019    Years since quitting:  2.5   Smokeless tobacco: Never  Vaping Use   Vaping Use: Never used  Substance Use Topics   Alcohol use: No   Drug use: No     Allergies   Patient has no known allergies.   Review of Systems Review of Systems   Physical Exam Triage Vital Signs ED Triage Vitals  Enc Vitals Group     BP 09/24/21 0932 (!) 151/101     Pulse Rate 09/24/21 0932 61     Resp 09/24/21 0932 18     Temp 09/24/21 0932 97.7 F (36.5 C)     Temp Source 09/24/21 0932 Oral     SpO2 09/24/21 0932 99 %     Weight --      Height --      Head Circumference --      Peak Flow --      Pain Score 09/24/21 0930 10     Pain Loc --      Pain Edu? --      Excl. in [place]? --    No data found.  Updated Vital Signs BP (!) 151/101 (BP Location: Right Arm)   Pulse 61   Temp 97.7 F (36.5 C) (Oral)   Resp 18   SpO2 99%   Visual Acuity Right Eye Distance:   Left Eye Distance:   Bilateral Distance:    Right Eye Near:   Left Eye Near:    Bilateral Near:     Physical Exam Vitals and nursing note reviewed.  Constitutional:      Appearance: Normal appearance.  HENT:     Head: Atraumatic.     Mouth/Throat:     Mouth: Mucous membranes are moist.     Comments: Minimal right lower gum erythema, edema.  No obvious abscess, sores Eyes:     Extraocular Movements: Extraocular movements intact.      Conjunctiva/sclera: Conjunctivae normal.  Cardiovascular:     Rate and Rhythm: Normal rate and regular rhythm.  Pulmonary:     Effort: Pulmonary effort is normal.     Breath sounds: Normal breath sounds. No wheezing or rales.  Musculoskeletal:        General: Normal range of motion.     Cervical back: Normal range of motion and neck supple.  Lymphadenopathy:     Cervical: No cervical adenopathy.  Skin:    General: Skin is warm and dry.  Neurological:     General: No focal deficit present.     Mental Status: He is oriented to person, place, and time.     Motor: No weakness.     Gait: Gait normal.  Psychiatric:        Mood and Affect: Mood normal.        Thought Content: Thought content normal.        Judgment: Judgment normal.     UC Treatments / Results  Labs (all labs ordered are listed, but only abnormal results are displayed) Labs Reviewed - No data to display  EKG   Radiology No results found.  Procedures Procedures (including critical care time)  Medications Ordered in UC Medications  ketorolac (TORADOL) 30 MG/ML injection 30 mg (30 mg Intramuscular Given 09/24/21 1007)    Initial Impression / Assessment and Plan / UC Course  I have reviewed the triage vital signs and the nursing notes.  Pertinent labs & imaging results that were available during my care of the patient were reviewed by me and considered in my medical decision  making (see chart for details).     Unclear etiology of pain, treat with Augmentin, Peridex, viscous lidocaine while awaiting dental follow-up.  IM Toradol for pain today.  Return for worsening symptoms.  Final Clinical Impressions(s) / UC Diagnoses   Final diagnoses:  Pain in gums   Discharge Instructions   None    ED Prescriptions     Medication Sig Dispense Auth. Provider   amoxicillin-clavulanate (AUGMENTIN) 875-125 MG tablet Take 1 tablet by mouth every 12 (twelve) hours. 14 tablet Volney American, [place]    lidocaine (XYLOCAINE) 2 % solution Use as directed 10 mLs in the mouth or throat every 3 (three) hours as needed for mouth pain. 100 mL Volney American, PA-C   chlorhexidine (PERIDEX) 0.12 % solution Use as directed 15 mLs in the mouth or throat 2 (two) times daily. 120 mL Volney American, [place]      PDMP not reviewed this encounter.   Volney American, [place] 09/24/21 1008

## 2021-09-24 NOTE — ED Triage Notes (Addendum)
Pt states his right lower right gums are in a lot of sharp pain for a few days  Pt states he has tried tea oil and Campho-Phenique  without any relief

## 2021-09-25 ENCOUNTER — Telehealth (HOSPITAL_COMMUNITY): Payer: Self-pay | Admitting: *Deleted

## 2021-09-25 DIAGNOSIS — I5022 Chronic systolic (congestive) heart failure: Secondary | ICD-10-CM

## 2021-09-25 NOTE — Telephone Encounter (Signed)
Received fax from Sports Medicine, pt needs clearance for TKA   Per Dr Haroldine Laws: "optimized for surgery from cardiac standpoint, moderate risk, would repeat echo prior to surgery to make sure EF is stable"  Order for echo placed, form faxed back to them at [phone]

## 2021-09-30 NOTE — Telephone Encounter (Signed)
Echo sch for 6/1

## 2021-10-01 DIAGNOSIS — D638 Anemia in other chronic diseases classified elsewhere: Secondary | ICD-10-CM | POA: Diagnosis not present

## 2021-10-01 DIAGNOSIS — E559 Vitamin D deficiency, unspecified: Secondary | ICD-10-CM | POA: Diagnosis not present

## 2021-10-01 DIAGNOSIS — E8722 Chronic metabolic acidosis: Secondary | ICD-10-CM | POA: Diagnosis not present

## 2021-10-01 DIAGNOSIS — N184 Chronic kidney disease, stage 4 (severe): Secondary | ICD-10-CM | POA: Diagnosis not present

## 2021-10-01 DIAGNOSIS — E210 Primary hyperparathyroidism: Secondary | ICD-10-CM | POA: Diagnosis not present

## 2021-10-02 ENCOUNTER — Ambulatory Visit (HOSPITAL_COMMUNITY)
Admission: RE | Admit: 2021-10-02 | Discharge: 2021-10-02 | Disposition: A | Discharge status: Home / Self Care | Payer: Medicare Other | Source: Ambulatory Visit | Attending: Internal Medicine | Admitting: Internal Medicine

## 2021-10-02 DIAGNOSIS — I5022 Chronic systolic (congestive) heart failure: Secondary | ICD-10-CM | POA: Diagnosis not present

## 2021-10-02 LAB — ECHOCARDIOGRAM COMPLETE
AR max vel: 1.5 cm2
AV Area VTI: 1.48 cm2
AV Area mean vel: 1.81 cm2
AV Mean grad: 3.2 mmHg
AV Peak grad: 7.3 mmHg
Ao pk vel: 1.35 m/s
Area-P 1/2: 2.83 cm2
Calc EF: 32.4 %
S' Lateral: 5.2 cm
Single Plane A2C EF: 27.4 %
Single Plane A4C EF: 36 %

## 2021-10-02 NOTE — Progress Notes (Signed)
*  PRELIMINARY RESULTS* Echocardiogram 2D Echocardiogram has been performed.  [name] 10/02/2021, 12:26 PM

## 2021-10-08 DIAGNOSIS — E8722 Chronic metabolic acidosis: Secondary | ICD-10-CM | POA: Diagnosis not present

## 2021-10-08 DIAGNOSIS — I129 Hypertensive chronic kidney disease with stage 1 through stage 4 chronic kidney disease, or unspecified chronic kidney disease: Secondary | ICD-10-CM | POA: Diagnosis not present

## 2021-10-08 DIAGNOSIS — E210 Primary hyperparathyroidism: Secondary | ICD-10-CM | POA: Diagnosis not present

## 2021-10-08 DIAGNOSIS — E870 Hyperosmolality and hypernatremia: Secondary | ICD-10-CM | POA: Diagnosis not present

## 2021-10-08 DIAGNOSIS — R809 Proteinuria, unspecified: Secondary | ICD-10-CM | POA: Diagnosis not present

## 2021-10-08 DIAGNOSIS — N189 Chronic kidney disease, unspecified: Secondary | ICD-10-CM | POA: Diagnosis not present

## 2021-10-08 DIAGNOSIS — N184 Chronic kidney disease, stage 4 (severe): Secondary | ICD-10-CM | POA: Diagnosis not present

## 2021-10-08 DIAGNOSIS — D631 Anemia in chronic kidney disease: Secondary | ICD-10-CM | POA: Diagnosis not present

## 2021-10-08 DIAGNOSIS — I5022 Chronic systolic (congestive) heart failure: Secondary | ICD-10-CM | POA: Diagnosis not present

## 2021-10-08 DIAGNOSIS — D638 Anemia in other chronic diseases classified elsewhere: Secondary | ICD-10-CM | POA: Diagnosis not present

## 2021-10-14 ENCOUNTER — Telehealth: Payer: Self-pay

## 2021-10-14 NOTE — Telephone Encounter (Signed)
Please advice  

## 2021-10-14 NOTE — Telephone Encounter (Signed)
Patient called need med refill  oxyCODONE-acetaminophen (PERCOCET) 10-325 MG tablet   Pharmacy: [facility]

## 2021-10-15 NOTE — Telephone Encounter (Signed)
Please advise 

## 2021-10-15 NOTE — Telephone Encounter (Signed)
Patient called asking can this be refilled today?

## 2021-10-15 NOTE — Telephone Encounter (Signed)
Echo done 6/1, slightly reduced EF at 20-25% (30-35% in Oct 2022), Dr Haroldine Laws would like to discuss with patient before clearing for surgery, will pt to discuss today or tomorrow.

## 2021-10-16 ENCOUNTER — Telehealth: Payer: Self-pay | Admitting: Internal Medicine

## 2021-10-16 ENCOUNTER — Other Ambulatory Visit: Payer: Self-pay | Admitting: Family Medicine

## 2021-10-16 DIAGNOSIS — M170 Bilateral primary osteoarthritis of knee: Secondary | ICD-10-CM

## 2021-10-16 DIAGNOSIS — G894 Chronic pain syndrome: Secondary | ICD-10-CM

## 2021-10-16 MED ORDER — OXYCODONE-ACETAMINOPHEN 10-325 MG PO TABS: 1.0000 | ORAL_TABLET | Freq: Two times a day (BID) | ORAL | 0 refills | Status: DC | PRN

## 2021-10-16 NOTE — Telephone Encounter (Signed)
Note faxed to Sports Medicine

## 2021-10-16 NOTE — Telephone Encounter (Signed)
Called pt no answer left vm [place] sent rx to pharmacy

## 2021-10-16 NOTE — Telephone Encounter (Signed)
Pt called stating he was supposed to call you back today in regards to the medication that was discussed yesterday. Can you please contact patient when available?

## 2021-10-16 NOTE — Telephone Encounter (Signed)
Called pt to let him know rx has been sent to pharmacy no answer left vm

## 2021-10-16 NOTE — Telephone Encounter (Signed)
Medication sent.

## 2021-10-17 NOTE — Telephone Encounter (Signed)
Spoke with pt he got rx

## 2021-10-21 ENCOUNTER — Telehealth: Payer: Self-pay | Admitting: Internal Medicine

## 2021-10-21 ENCOUNTER — Other Ambulatory Visit: Payer: Self-pay | Admitting: Internal Medicine

## 2021-10-21 DIAGNOSIS — M170 Bilateral primary osteoarthritis of knee: Secondary | ICD-10-CM

## 2021-10-21 DIAGNOSIS — H2513 Age-related nuclear cataract, bilateral: Secondary | ICD-10-CM | POA: Diagnosis not present

## 2021-10-21 DIAGNOSIS — H401223 Low-tension glaucoma, left eye, severe stage: Secondary | ICD-10-CM | POA: Diagnosis not present

## 2021-10-21 DIAGNOSIS — G894 Chronic pain syndrome: Secondary | ICD-10-CM

## 2021-10-21 MED ORDER — OXYCODONE-ACETAMINOPHEN 10-325 MG PO TABS: 1.0000 | ORAL_TABLET | Freq: Two times a day (BID) | ORAL | 0 refills | Status: DC | PRN

## 2021-10-21 NOTE — Telephone Encounter (Signed)
Patient came by office in regard to    oxyCODONE-acetaminophen (PERCOCET) 10-325 MG tablet   Med was filled but for only 14 tabs   Patient wants a call back as in to why full amount was not sent in and would like full amount sent in.

## 2021-10-21 NOTE — Telephone Encounter (Signed)
Pt advised provider is out of the office and this will be taken care of when you return

## 2021-10-22 ENCOUNTER — Other Ambulatory Visit: Payer: Self-pay | Admitting: Internal Medicine

## 2021-10-22 ENCOUNTER — Telehealth: Payer: Self-pay | Admitting: Internal Medicine

## 2021-10-22 MED ORDER — PANTOPRAZOLE SODIUM 40 MG PO TBEC: 40.0000 mg | DELAYED_RELEASE_TABLET | Freq: Two times a day (BID) | ORAL | 11 refills | Status: DC

## 2021-10-22 NOTE — Telephone Encounter (Signed)
Patient needs his Pantoprozole sent for refill to [facility]

## 2021-10-22 NOTE — Telephone Encounter (Signed)
noted 

## 2021-10-22 NOTE — Telephone Encounter (Signed)
Done, thanks

## 2021-10-22 NOTE — Telephone Encounter (Signed)
Dr Abbey Chatters,  The pt phoned stating he needed a refill of his Pantoprazole sent to [facility]. His last ov was 03-05-2021

## 2021-11-01 ENCOUNTER — Other Ambulatory Visit: Payer: Self-pay | Admitting: Internal Medicine

## 2021-11-10 ENCOUNTER — Other Ambulatory Visit: Payer: Self-pay | Admitting: Internal Medicine

## 2021-11-17 ENCOUNTER — Telehealth: Payer: Self-pay

## 2021-11-17 ENCOUNTER — Other Ambulatory Visit: Payer: Self-pay | Admitting: Internal Medicine

## 2021-11-17 DIAGNOSIS — M170 Bilateral primary osteoarthritis of knee: Secondary | ICD-10-CM

## 2021-11-17 DIAGNOSIS — G894 Chronic pain syndrome: Secondary | ICD-10-CM

## 2021-11-17 MED ORDER — OXYCODONE-ACETAMINOPHEN 10-325 MG PO TABS: 1.0000 | ORAL_TABLET | Freq: Two times a day (BID) | ORAL | 0 refills | Status: DC | PRN

## 2021-11-17 NOTE — Telephone Encounter (Signed)
Patient called said need med refill, please call patient once medicine has been sent into the pharmacy.   oxyCODONE-acetaminophen (PERCOCET) 10-325 MG tablet   Pharmacy: [facility]

## 2021-11-19 NOTE — Progress Notes (Signed)
Subjective:     Patient ID: [name], male   DOB: 1955/12/25, 66 y.o.   MRN: [id number]  HPI  Mr. Zwahlen is a 66 year old man who presents for f/u of severe bilateral knee OA.   -the oxycodone was the only thing that helped. He says he has been clean of any recreational drugs. He asks whether we can give him another chance to prescribe this for him. Discussed unfortunately for him that our clinic rules do not permit prescription of controlled medications when there has been cocaine present in urine sample. He is not sleeping well due to his pain. Discussed trying amitriptyline at night for pain and insomnia but I would like him to clear this with his cardiologist first.  -He has bilateral knee pain, 10/10, that is sharp, stabbing, and aching.  -he is ready to try Qutenza today -he is still frustrated that we cannot prescribe any opioids for him- his PCP did prescribe them for a short term -he asks why he can't have a second chance  -He used to walk a lot in his work.   -He has had injections in his bilateral knees with steroid and Monovisc without benefit. He has never tried oral steroids.   -He has tried Ibuprofen and Meloxicam without benefit. Discussed retrying this option with his cardiologist Dr. Haroldine Laws, but it is contraindicated given his stage 2 CKD  -Fortunately he was recently cleared to get a left knee replacement by Dr. Haroldine Laws at his appointment yesterday- wears a Lifevest and has ICD- has been doing well in this regard.   As per patient he was referred here by Dr. Ninfa Linden as [name] is no longer providing benefit and Dr. Ninfa Linden felt Oxycodone may be warranted and should be managed by pain clinic.   Pain Inventory Average Pain 9 Pain Right Now 9 My pain is constant, sharp, and tingling  In the last 24 hours, has pain interfered with the following? General activity 8 Relation with others 8 Enjoyment of life 8 What TIME of day is your pain at its worst?  morning  and night Sleep (in general) Poor  Pain is worse with: walking and sitting Pain improves with: medication Relief from Meds: 10        Family History  Problem Relation Age of Onset  . Heart failure Mother   . Heart disease Mother   . Cancer Mother        breast  . Heart attack Father   . Hypertension Father   . Heart disease Father   . Diabetes Brother    Social History   Socioeconomic History  . Marital status: Single    Spouse name: Not on file  . Number of children: 0  . Years of education: Not on file  . Highest education level: Some college, no degree  Occupational History  . Occupation: retired  Tobacco Use  . Smoking status: Former    Packs/day: 0.50    Years: 40.00    Total pack years: 20.00    Types: Cigarettes    Quit date: 03/12/2019    Years since quitting: 2.8  . Smokeless tobacco: Never  Vaping Use  . Vaping Use: Never used  Substance and Sexual Activity  . Alcohol use: No  . Drug use: No  . Sexual activity: Not Currently  Other Topics Concern  . Not on file  Social History Narrative   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       Enjoys:  fishing, shopping, working around the house       Diet: working on changes, avoiding fried foods, increased veggies   Caffeine: teas some daily   Water: 6-8 cups daily       Wears seat belt    Does not use phone while driving    Oceanographer at home    Social Determinants of Health   Financial Resource Strain: Low Risk  (01/19/2022)   Overall Financial Resource Strain (CARDIA)   . Difficulty of Paying Living Expenses: Not hard at all  Food Insecurity: No Food Insecurity (01/19/2022)   Hunger Vital Sign   . Worried About Charity fundraiser in the Last Year: Never true   . Ran Out of Food in the Last Year: Never true  Transportation Needs: No Transportation Needs (01/19/2022)   PRAPARE - Transportation   . Lack of Transportation (Medical): No   . Lack of Transportation (Non-Medical): No   Physical Activity: Sufficiently Active (01/19/2022)   Exercise Vital Sign   . Days of Exercise per Week: 7 days   . Minutes of Exercise per Session: 30 min  Stress: No Stress Concern Present (01/19/2022)   [place]   . Feeling of Stress : Not at all  Social Connections: Moderately Isolated (01/19/2022)   Social Connection and Isolation Panel [NHANES]   . Frequency of Communication with Friends and Family: More than three times a week   . Frequency of Social Gatherings with Friends and Family: More than three times a week   . Attends Religious Services: More than 4 times per year   . Active Member of Clubs or Organizations: No   . Attends Archivist Meetings: Never   . Marital Status: Never married   Past Surgical History:  Procedure Laterality Date  . AMPUTATION TOE Left 1991   2 digit of left foot  . BIOPSY  02/26/2021   Procedure: BIOPSY;  Surgeon: Rogene Houston, MD;  [facility]: [facility];  Service: Endoscopy;;  . CHOLECYSTECTOMY    . COLONOSCOPY    . COLONOSCOPY WITH PROPOFOL N/A 02/26/2021   Procedure: COLONOSCOPY WITH PROPOFOL;  Surgeon: Rogene Houston, MD;  [facility]: [facility];  Service: Endoscopy;  Laterality: N/A;  . CORONARY/GRAFT ACUTE MI REVASCULARIZATION N/A 03/12/2019   Procedure: Coronary/Graft Acute MI Revascularization;  Surgeon: Belva Crome, MD;  Location: [facility];  Service: Cardiovascular;  Laterality: N/A;  . ESOPHAGOGASTRODUODENOSCOPY (EGD) WITH PROPOFOL N/A 02/26/2021   Procedure: ESOPHAGOGASTRODUODENOSCOPY (EGD) WITH PROPOFOL;  Surgeon: Rogene Houston, MD;  [facility];  Service: Endoscopy;  Laterality: N/A;  . ICD IMPLANT N/A 08/24/2019   Procedure: ICD IMPLANT;  Surgeon: Evans Lance, MD;  Location: [facility];  Service: Cardiovascular;  Laterality: N/A;  . IR URETERAL STENT LEFT NEW ACCESS W/O SEP NEPHROSTOMY CATH  09/26/2019  . LEFT  HEART CATH AND CORONARY ANGIOGRAPHY N/A 03/12/2019   Procedure: LEFT HEART CATH AND CORONARY ANGIOGRAPHY;  Surgeon: Belva Crome, MD;  Location: [facility];  Service: Cardiovascular;  Laterality: N/A;  . LITHOTRIPSY Right   . NEPHROLITHOTOMY Left 09/26/2019   Procedure: LEFT NEPHROLITHOTOMY PERCUTANEOUS;  Surgeon: Irine Seal, MD;  Location: [facility];  Service: Urology;  Laterality: Left;   Past Medical History:  Diagnosis Date  . Acute ST elevation myocardial infarction (STEMI) due to occlusion of mid portion of left anterior descending (LAD) coronary artery () 03/12/2019  .  Acute systolic heart failure ([place])   . AICD (automatic cardioverter/defibrillator) present   . Arthritis   . CHF (congestive heart failure) ([place])   . CKD (chronic kidney disease) stage 4, GFR 15-29 ml/min (HCC)   . Diabetes mellitus, type II ([place])   . Dyspnea    with exertionm periodically  . Heart attack ([place]) 03/15/2019   anterior MI   . History of kidney stones   . Hyperparathyroidism ([place])   . Hypertension 2002  . Ischemic cardiomyopathy   . Left knee pain   . Syncope 04/04/2019   after blood draw  . Vitamin D deficiency    There were no vitals taken for this visit.  Opioid Risk Score:   Fall Risk Score:  `1  Depression screen [facility] 2/9     01/19/2022    3:19 PM 01/19/2022    3:18 PM 09/17/2021   11:16 AM 07/21/2021    9:43 AM 06/10/2021    2:17 PM 04/23/2021   10:20 AM 04/01/2021    1:17 PM  Depression screen [facility] 2/9  Decreased Interest 0 0 0 0 0 0 0  Down, Depressed, Hopeless 0 0 0 0 0 0 0  PHQ - 2 Score 0 0 0 0 0 0 0    Review of Systems  Constitutional: Negative.   HENT: Negative.    Eyes: Negative.   Respiratory: Negative.    Cardiovascular: Negative.   Gastrointestinal: Negative.   Endocrine: Negative.   Genitourinary: Negative.   Musculoskeletal:  Positive for arthralgias, back pain and gait problem.       Pain in both knees  Skin: Negative.   Allergic/Immunologic:  Negative.   Hematological: Negative.   Psychiatric/Behavioral: Negative.    All other systems reviewed and are negative.     Objective:   Physical Exam Gen: no distress, normal appearing, BP 149/86, BMI 26.85 HEENT: oral mucosa pink and moist, NCAT Cardio: Reg rate Chest: normal effort, normal rate of breathing Abd: soft, non-distended Ext: no edema Psych: pleasant, normal affect Skin: dry Neuro: Alert and oriented x3. Ambulating in office and outside with antalgic gait without AD. Tender to palpation in bilateral knee joint medial to lateral with bony protrusions. +genu valgum, left>right.    Assessment:         Plan:     Mr. Fredrick Geoghegan is 82 man who presents for follow-up of bilateral knee OA.  1) Chronic Pain Syndrome secondary to bilateral knee OA -Discussed current symptoms of pain and history of pain.  -Provided with a pain relief journal and discussed that it contains foods and lifestyle tips to naturally help to improve pain. Discussed that these lifestyle strategies are also very good for health unlike some medications which can have negative side effects. Discussed that the act of keeping a journal can be therapeutic and helpful to realize patterns what helps to trigger and alleviate pain.   -Discussed Qutenza as an option for neuropathic pain control. Discussed that this is a capsaicin patch, stronger than capsaicin cream. Discussed that it is currently approved for diabetic peripheral neuropathy and post-herpetic neuralgia, but that it has also shown benefit in treating other forms of neuropathy. Provided patient with link to site to learn more about the patch: CinemaBonus.fr. Discussed that the patch would be placed in office and benefits usually last 3 months. Discussed that unintended exposure to capsaicin can cause severe irritation of eyes, mucous membranes, respiratory tract, and skin, but that Qutenza is a local treatment and does not  have the systemic  side effects of other nerve medications. Discussed that there may be pain, itching, erythema, and decreased sensory function associated with the application of Qutenza. Side effects usually subside within 1 week. A cold pack of analgesic medications can help with these side effects. Blood pressure can also be increased due to pain associated with administration of the patch.  1 patch of Qutenza was applied to the area of pain. Ice packs were applied during the procedure to ensure patient comfort. Blood pressure was monitored every 15 minutes. The patient tolerated the procedure well. Post-procedure instructions were given and follow-up has been scheduled.   Recommended [employer] -referred to [facility] for second opinion -Discussed benefits of exercise in reducing pain. -Discussed following foods that may reduce pain: 1) Ginger (especially studied for arthritis)- reduce leukotriene production to decrease inflammation 2) Blueberries- high in phytonutrients that decrease inflammation 3) Salmon- marine omega-3s reduce joint swelling and pain 4) Pumpkin seeds- reduce inflammation 5) dark chocolate- reduces inflammation 6) turmeric- reduces inflammation 7) tart cherries - reduce pain and stiffness 8) extra virgin olive oil - its compound olecanthal helps to block prostaglandins  9) chili peppers- can be eaten or applied topically via capsaicin 10) mint- helpful for headache, muscle aches, joint pain, and itching 11) garlic- reduces inflammation  Link to further information on diet for chronic pain: http://www.randall.com/    -XRs reviewed and show moderate tricompartmental arthritis.  -UDS obtained previously and is positive for cocaine. He denies cocaine use, but we cannot prescribe controlled substances in this case. Discussed with him and offered referral to [facility] and he is agreeable. He would like  to continue following here as well.  -Discussed that opioids have significant side effects of tolerance and dependence as well and are not intended for long-term treatment.  -Plan for steroid injection next visit. If he is able to get a surgical date in the near future, will cancel steroid injection.  Turmeric to reduce inflammation--can be used in cooking or taken as a supplement.  Benefits of turmeric:  -Highly anti-inflammatory  -Increases antioxidants  -Improves memory, attention, brain disease  -Lowers risk of heart disease  -May help prevent cancer  -Decreases pain  -Alleviates depression  -Delays aging and decreases risk of chronic disease  -Consume with black pepper to increase absorption    Turmeric Milk Recipe:  1 cup milk  1 tsp turmeric  1 tsp cinnamon  1 tsp grated ginger (optional)  Black pepper (boosts the anti-inflammatory properties of turmeric).  1 tsp honey  2) Impaired mobility and ADLs -Provided with handicap placard given antalgic gait and limited mobility  -Provided with script for a single-point cane.   3) stage 4 CKD: -discussed with Dr. Haroldine Laws, advised patient that Meloxicam is contraindicated given stage 4 CKD  4) insomnia -recommended clearing amitriptyline with his cardiologist.  -Try to go outside near sunrise -Get exercise during the day.  -Turn off all devices an hour before bedtime.  -Teas that can benefit: chamomile, valerian root, Brahmi (Bacopa) -Can consider over the counter melatonin, magnesium, and/or L-theanine. Melatonin is an anti-oxidant with multiple health benefits. Magnesium is involved in greater than 300 enzymatic reactions in the body and most of [place] are deficient as our soil is often depleted. There are 7 different types of magnesium- Bioptemizer's is a supplement with all 7 types, and each has unique benefits. Magnesium can also help with constipation and anxiety.  -Pistachios naturally increase the  production of melatonin -Cozy  Earth bamboo bed sheets are free from toxic chemicals.  -Tart cherry juice or a tart cherry supplement can improve sleep and soreness post-workout       This encounter was created in error - please disregard.

## 2021-11-20 ENCOUNTER — Encounter
Payer: Medicare Other | Attending: Physical Medicine and Rehabilitation | Admitting: Physical Medicine and Rehabilitation

## 2021-11-20 DIAGNOSIS — M170 Bilateral primary osteoarthritis of knee: Secondary | ICD-10-CM

## 2021-11-20 DIAGNOSIS — G4701 Insomnia due to medical condition: Secondary | ICD-10-CM

## 2021-11-28 ENCOUNTER — Ambulatory Visit (INDEPENDENT_AMBULATORY_CARE_PROVIDER_SITE_OTHER): Payer: Medicare Other

## 2021-11-28 DIAGNOSIS — I255 Ischemic cardiomyopathy: Secondary | ICD-10-CM | POA: Diagnosis not present

## 2021-11-28 LAB — CUP PACEART REMOTE DEVICE CHECK
Battery Remaining Longevity: 162 mo
Battery Remaining Percentage: 100 %
Brady Statistic RV Percent Paced: 0 %
Date Time Interrogation Session: 20230728041400
HighPow Impedance: 72 Ohm
Implantable Lead Implant Date: 20210422
Implantable Lead Location: 753860
Implantable Lead Model: 138
Implantable Lead Serial Number: 302700
Implantable Pulse Generator Implant Date: 20210422
Lead Channel Impedance Value: 441 Ohm
Lead Channel Setting Pacing Amplitude: 2.5 V
Lead Channel Setting Pacing Pulse Width: 0.4 ms
Lead Channel Setting Sensing Sensitivity: 0.5 mV
Pulse Gen Serial Number: 209495

## 2021-12-09 ENCOUNTER — Other Ambulatory Visit: Payer: Self-pay | Admitting: Internal Medicine

## 2021-12-09 ENCOUNTER — Telehealth: Payer: Self-pay | Admitting: Internal Medicine

## 2021-12-09 DIAGNOSIS — M170 Bilateral primary osteoarthritis of knee: Secondary | ICD-10-CM

## 2021-12-09 DIAGNOSIS — G894 Chronic pain syndrome: Secondary | ICD-10-CM

## 2021-12-09 NOTE — Telephone Encounter (Signed)
Patient needs refill on  oxyCODONE-acetaminophen (PERCOCET) 10-325 MG tablet   

## 2021-12-09 NOTE — Telephone Encounter (Signed)
LVM for pt to call the office.

## 2021-12-10 ENCOUNTER — Ambulatory Visit: Payer: Medicare Other | Admitting: "Endocrinology

## 2021-12-10 NOTE — Telephone Encounter (Signed)
Patient stated he does not need a refill right now he knows he cannot get filled until closer to 8/25

## 2021-12-17 NOTE — Progress Notes (Signed)
Remote ICD transmission.   

## 2021-12-19 ENCOUNTER — Other Ambulatory Visit: Payer: Self-pay | Admitting: Internal Medicine

## 2021-12-19 ENCOUNTER — Telehealth: Payer: Self-pay

## 2021-12-19 DIAGNOSIS — G894 Chronic pain syndrome: Secondary | ICD-10-CM

## 2021-12-19 DIAGNOSIS — M170 Bilateral primary osteoarthritis of knee: Secondary | ICD-10-CM

## 2021-12-19 MED ORDER — OXYCODONE-ACETAMINOPHEN 10-325 MG PO TABS
1.0000 | ORAL_TABLET | Freq: Two times a day (BID) | ORAL | 0 refills | Status: DC | PRN
Start: 2021-12-19 — End: 2022-01-07

## 2021-12-19 NOTE — Telephone Encounter (Signed)
Patient called need med refill  oxyCODONE-acetaminophen (PERCOCET) 10-325 MG tablet   [place] [place] [facility] [address]  [place], [place] [address]  Phone:  [phone]  Fax:  [phone]

## 2021-12-19 NOTE — Telephone Encounter (Signed)
Patient called in regard to  oxyCODONE-acetaminophen (PERCOCET) 10-325 MG tablet    States that it has wrong refill date. Date is 9/22 instead needs to be 8/22

## 2021-12-22 ENCOUNTER — Telehealth: Payer: Self-pay | Admitting: Internal Medicine

## 2021-12-22 NOTE — Telephone Encounter (Signed)
Spoke with pharmacy

## 2021-12-22 NOTE — Telephone Encounter (Signed)
Patient  called back  again in regard to last tele convo.  Pharm will not release medication until tomorrow. Patient needs new script .

## 2021-12-22 NOTE — Telephone Encounter (Signed)
Patient called in regard to last tele. Patient states that he went by pharm on Friday and was still unable to pick up med.   Provider needs to call pharm in regard to misdate.

## 2021-12-26 ENCOUNTER — Ambulatory Visit
Admission: EM | Admit: 2021-12-26 | Discharge: 2021-12-26 | Disposition: A | Discharge status: Home / Self Care | Payer: Medicare Other

## 2021-12-26 DIAGNOSIS — M25562 Pain in left knee: Secondary | ICD-10-CM

## 2021-12-26 MED ORDER — DICLOFENAC SODIUM 1 % EX GEL: 2.0000 g | Freq: Four times a day (QID) | CUTANEOUS | 0 refills | Status: DC

## 2021-12-26 NOTE — Discharge Instructions (Addendum)
-   Please do not take NSAIDs that are pills (ibuprofen, Aleve, Motrin, BC goody) for pain relief because of your kidneys - I have prescribed Voltaren gel for you to use to the knee to help with pain - Follow up with your Orthopedic or primary care provider if the pain worsens or does not improve with the Voltaren

## 2021-12-26 NOTE — ED Provider Notes (Signed)
[facility]    CSN: [id number] Arrival date & time: 12/26/21  1309      History   Chief Complaint Chief Complaint  Patient presents with   Knee Pain    HPI [name] is a 66 y.o. male.   Patient presents with left knee pain that is "bone-on-bone".  Reports he has been seen by an orthopedic provider and has surgery scheduled for October.  He is requesting ibuprofen 800 mg today.  He reports his knee pain is recently worsened.  Denies any recent fall, accident, injury, or trauma to the knee.  Reports the pain is worse with weightbearing or any movement.  He is unable to take NSAIDs secondary to chronic kidney disease as well as heart disease.  He denies fevers, nausea/vomiting since the pain worsened.  Denies any worsening swelling, redness.  No numbness or tingling in his toes or down his leg.  Review of chart shows that he receives Percocet from his primary care provider.  He also has a an orthopedic provider that he last saw yesterday.      Past Medical History:  Diagnosis Date   Acute ST elevation myocardial infarction (STEMI) due to occlusion of mid portion of left anterior descending (LAD) coronary artery ([place]) 29/47/6546   Acute systolic heart failure (HCC)    Arthritis    CHF (congestive heart failure) (HCC)    CKD (chronic kidney disease) stage 4, GFR 15-29 ml/min (HCC)    Diabetes mellitus, type II ([place])    Dyspnea    with exertionm periodically   Heart attack ([place]) 03/15/2019   anterior MI    History of kidney stones    Hyperparathyroidism ([place])    Hypertension 2002   Ischemic cardiomyopathy    Left knee pain    Syncope 04/04/2019   after blood draw   Vitamin D deficiency     Patient Active Problem List   Diagnosis Date Noted   Encounter for general adult medical examination with abnormal findings 09/17/2021   Other hyperparathyroidism ([place]) 06/13/2021   Vitamin D deficiency 06/13/2021   Nonintractable episodic headache 04/23/2021    Primary osteoarthritis of both knees 04/23/2021   Anemia due to chronic blood loss 03/05/2021   Gastric ulcer 03/05/2021   Type 2 diabetes with nephropathy ([place])    LUQ pain    Prediabetes 02/22/2021   Left renal stone 09/26/2019   CAD in native artery    Ischemic cardiomyopathy 08/15/2019   Congestive heart failure ([place]) 03/21/2019   ST elevation myocardial infarction involving left anterior descending (LAD) coronary artery (HCC)    Chronic kidney disease, stage IV (severe) ([place]) 06/11/2016   Elevated PSA 06/11/2016   Hyperlipidemia 06/11/2016   Essential hypertension 05/28/2016    Past Surgical History:  Procedure Laterality Date   AMPUTATION TOE Left 1991   2 digit of left foot   BIOPSY  02/26/2021   Procedure: BIOPSY;  Surgeon: Rogene Houston, MD;  [facility]: [facility];  Service: Endoscopy;;   CHOLECYSTECTOMY     COLONOSCOPY     COLONOSCOPY WITH PROPOFOL N/A 02/26/2021   Procedure: COLONOSCOPY WITH PROPOFOL;  Surgeon: Rogene Houston, MD;  [facility]: [facility];  Service: Endoscopy;  Laterality: N/A;   CORONARY/GRAFT ACUTE MI REVASCULARIZATION N/A 03/12/2019   Procedure: Coronary/Graft Acute MI Revascularization;  Surgeon: Belva Crome, MD;  Location: [facility];  Service: Cardiovascular;  Laterality: N/A;   ESOPHAGOGASTRODUODENOSCOPY (EGD) WITH PROPOFOL N/A 02/26/2021   Procedure: ESOPHAGOGASTRODUODENOSCOPY (EGD)  WITH PROPOFOL;  Surgeon: Rogene Houston, MD;  [facility];  Service: Endoscopy;  Laterality: N/A;   ICD IMPLANT N/A 08/24/2019   Procedure: ICD IMPLANT;  Surgeon: Evans Lance, MD;  Location: [facility];  Service: Cardiovascular;  Laterality: N/A;   IR URETERAL STENT LEFT NEW ACCESS W/O SEP NEPHROSTOMY CATH  09/26/2019   LEFT HEART CATH AND CORONARY ANGIOGRAPHY N/A 03/12/2019   Procedure: LEFT HEART CATH AND CORONARY ANGIOGRAPHY;  Surgeon: Belva Crome, MD;  Location: [facility];  Service: Cardiovascular;  Laterality:  N/A;   LITHOTRIPSY Right    NEPHROLITHOTOMY Left 09/26/2019   Procedure: LEFT NEPHROLITHOTOMY PERCUTANEOUS;  Surgeon: Irine Seal, MD;  Location: [facility];  Service: Urology;  Laterality: Left;       Home Medications    Prior to Admission medications   Medication Sig Start Date End Date Taking? Authorizing Provider  diclofenac Sodium (VOLTAREN) 1 % GEL Apply 2 g topically 4 (four) times daily. 12/26/21  Yes Noemi Chapel A, NP  ibuprofen (ADVIL) 200 MG tablet Take 200 mg by mouth every 6 (six) hours as needed.   Yes [provider]  allopurinol (ZYLOPRIM) 100 MG tablet Take 100 mg by mouth daily.  04/25/19 03/18/22  [provider]  amoxicillin-clavulanate (AUGMENTIN) 875-125 MG tablet Take 1 tablet by mouth every 12 (twelve) hours. 09/24/21   Volney American, PA-C  aspirin 81 MG chewable tablet Chew 1 tablet (81 mg total) by mouth daily. 03/04/21   Barton Dubois, MD  atorvastatin (LIPITOR) 80 MG tablet TAKE 1 TABLET BY MOUTH DAILY AT [facility] 11/03/21   Lindell Spar, MD  BRILINTA 60 MG TABS tablet TAKE (1) TABLET BY MOUTH TWICE DAILY. 06/05/21   Bensimhon, Shaune Pascal, MD  carvedilol (COREG) 12.5 MG tablet Take 1 tablet (12.5 mg total) by mouth 2 (two) times daily with a meal. 06/04/21   Bensimhon, Shaune Pascal, MD  chlorhexidine (PERIDEX) 0.12 % solution Use as directed 15 mLs in the mouth or throat 2 (two) times daily. 09/24/21   Volney American, PA-C  Cholecalciferol (VITAMIN D) 50 MCG (2000 UT) CAPS Take 2,000 Units by mouth daily with breakfast.    [provider]  cinacalcet (SENSIPAR) 30 MG tablet Take 30 mg by mouth daily. 12/28/20   [provider]  colchicine 0.6 MG tablet Take 0.6 mg by mouth daily. 05/15/20   [provider]  CORLANOR 5 MG TABS tablet TAKE 1 TABLET BY MOUTH TWICE DAILY WITH A MEAL 08/05/21   Evans Lance, MD  fluticasone [facility]) 50 MCG/ACT nasal spray Place into both nostrils as needed for allergies or rhinitis.     [provider]  furosemide (LASIX) 40 MG tablet Take 40 mg by mouth as needed.    [provider]  lidocaine (XYLOCAINE) 2 % solution Use as directed 10 mLs in the mouth or throat every 3 (three) hours as needed for mouth pain. 09/24/21   Volney American, PA-C  nitroGLYCERIN (NITROSTAT) 0.4 MG SL tablet Place 1 tablet (0.4 mg total) under the tongue every 5 (five) minutes as needed. 04/04/21   Lindell Spar, MD  oxyCODONE-acetaminophen (PERCOCET) 10-325 MG tablet Take 1 tablet by mouth every 12 (twelve) hours as needed for pain. 12/19/21   Lindell Spar, MD  pantoprazole (PROTONIX) 40 MG tablet Take 1 tablet (40 mg total) by mouth 2 (two) times daily before a meal. 10/22/21 10/22/22  Eloise Harman, DO  Family History Family History  Problem Relation Age of Onset   Heart failure Mother    Heart disease Mother    Cancer Mother        breast   Heart attack Father    Hypertension Father    Heart disease Father    Diabetes Brother     Social History Social History   Tobacco Use   Smoking status: Former    Packs/day: 0.50    Years: 40.00    Total pack years: 20.00    Types: Cigarettes    Quit date: 03/12/2019    Years since quitting: 2.7   Smokeless tobacco: Never  Vaping Use   Vaping Use: Never used  Substance Use Topics   Alcohol use: No   Drug use: No     Allergies   Patient has no known allergies.   Review of Systems Review of Systems Per HPI  Physical Exam Triage Vital Signs ED Triage Vitals  Enc Vitals Group     BP 12/26/21 1340 (!) 142/89     Pulse Rate 12/26/21 1340 60     Resp 12/26/21 1340 18     Temp 12/26/21 1340 98.2 F (36.8 C)     Temp Source 12/26/21 1340 Oral     SpO2 12/26/21 1340 98 %     Weight --      Height --      Head Circumference --      Peak Flow --      Pain Score 12/26/21 1338 10     Pain Loc --      Pain Edu? --      Excl. in [place]? --    No data found.  Updated Vital Signs BP (!) 142/89 (BP  Location: Right Arm)   Pulse 60   Temp 98.2 F (36.8 C) (Oral)   Resp 18   SpO2 98%   Visual Acuity Right Eye Distance:   Left Eye Distance:   Bilateral Distance:    Right Eye Near:   Left Eye Near:    Bilateral Near:     Physical Exam Vitals and nursing note reviewed.  Constitutional:      General: He is not in acute distress.    Appearance: Normal appearance. He is not toxic-appearing.  HENT:     Mouth/Throat:     Mouth: Mucous membranes are moist.     Pharynx: Oropharynx is clear.  Pulmonary:     Effort: Pulmonary effort is normal. No respiratory distress.  Musculoskeletal:     Left knee: Effusion and bony tenderness present. No erythema or ecchymosis. Normal range of motion. Tenderness present.     Right lower leg: No edema.     Left lower leg: No edema.     Comments: Inspection: Mild swelling/effusion to the left knee.  No obvious deformity or redness.  Palpation: Left knee tender to palpation diffusely; no obvious deformities palpated ROM: did not assess secondary to pain Strength: did not assess secondary to pain Neurovascular: neurovascularly intact in left lower extremity  Skin:    General: Skin is warm and dry.     Capillary Refill: Capillary refill takes less than 2 seconds.     Coloration: Skin is not jaundiced or pale.     Findings: No erythema.  Neurological:     Mental Status: He is alert and oriented to person, place, and time.  Psychiatric:        Behavior: Behavior is cooperative.  UC Treatments / Results  Labs (all labs ordered are listed, but only abnormal results are displayed) Labs Reviewed - No data to display  EKG   Radiology No results found.  Procedures Procedures (including critical care time)  Medications Ordered in UC Medications - No data to display  Initial Impression / Assessment and Plan / UC Course  I have reviewed the triage vital signs and the nursing notes.  Pertinent labs & imaging results that were  available during my care of the patient were reviewed by me and considered in my medical decision making (see chart for details).    Patient is a well-appearing 66 year old male presenting for bilateral knee pain, worse on left.  In triage, his vital signs are stable, he has slightly hypertensive, afebrile, not tachycardic, not tachypneic, next needing well on room air.  He is specifically requesting ibuprofen 800 mg today.  We discussed at length that ibuprofen is contraindicated for him secondary to chronic kidney disease.  Encouraged use of Tylenol and previously prescribed Percocet from primary care provider for pain.  We did discuss use of Voltaren gel and that is an NSAID, but he could still try in the short-term basis to help with his knee pain.  Encourage close follow-up with orthopedic provider primary care provider if pain worsens or does not improve with Voltaren gel. Final Clinical Impressions(s) / UC Diagnoses   Final diagnoses:  Acute pain of left knee     Discharge Instructions      - Please do not take NSAIDs that are pills (ibuprofen, Aleve, Motrin, BC goody) for pain relief because of your kidneys - I have prescribed Voltaren gel for you to use to the knee to help with pain - Follow up with your Orthopedic or primary care provider if the pain worsens or does not improve with the Voltaren    ED Prescriptions     Medication Sig Dispense Auth. Provider   diclofenac Sodium (VOLTAREN) 1 % GEL Apply 2 g topically 4 (four) times daily. 50 g Eulogio Bear, NP      I have reviewed the PDMP during this encounter.   Eulogio Bear, NP 12/26/21 [phone]

## 2021-12-26 NOTE — ED Triage Notes (Signed)
Pt reports left knee pain for couple of month. Pt reports the is having left knee surgery on 02/02/2022 because he has "bone on bone". Pt requested ibuprofen 800 mg, states this helped fin the pass for the knee pain.

## 2022-01-07 ENCOUNTER — Other Ambulatory Visit: Payer: Self-pay | Admitting: Internal Medicine

## 2022-01-07 ENCOUNTER — Telehealth: Payer: Self-pay | Admitting: Internal Medicine

## 2022-01-07 DIAGNOSIS — M170 Bilateral primary osteoarthritis of knee: Secondary | ICD-10-CM

## 2022-01-07 DIAGNOSIS — G894 Chronic pain syndrome: Secondary | ICD-10-CM

## 2022-01-07 MED ORDER — OXYCODONE-ACETAMINOPHEN 10-325 MG PO TABS: 1.0000 | ORAL_TABLET | Freq: Two times a day (BID) | ORAL | 0 refills | Status: DC | PRN

## 2022-01-07 NOTE — Telephone Encounter (Signed)
Patient called in for refill on oxyCODONE-acetaminophen (PERCOCET) 10-325 MG tablet    Just wants to make sure it goes through when time comes

## 2022-01-14 ENCOUNTER — Telehealth: Payer: Self-pay | Admitting: Internal Medicine

## 2022-01-14 NOTE — Telephone Encounter (Signed)
Patient needs refill on  oxyCODONE-acetaminophen (PERCOCET) 10-325 MG tablet   

## 2022-01-14 NOTE — Telephone Encounter (Signed)
Please let pt know this has been sent to the pharmacy and cannot be filled until 9-18 per insurance regulations

## 2022-01-15 DIAGNOSIS — E210 Primary hyperparathyroidism: Secondary | ICD-10-CM | POA: Diagnosis not present

## 2022-01-15 DIAGNOSIS — D638 Anemia in other chronic diseases classified elsewhere: Secondary | ICD-10-CM | POA: Diagnosis not present

## 2022-01-15 DIAGNOSIS — I5022 Chronic systolic (congestive) heart failure: Secondary | ICD-10-CM | POA: Diagnosis not present

## 2022-01-15 DIAGNOSIS — N184 Chronic kidney disease, stage 4 (severe): Secondary | ICD-10-CM | POA: Diagnosis not present

## 2022-01-16 NOTE — Progress Notes (Signed)
Sent message, via epic in basket, requesting orders in epic from surgeon.  

## 2022-01-19 ENCOUNTER — Ambulatory Visit (INDEPENDENT_AMBULATORY_CARE_PROVIDER_SITE_OTHER): Payer: Medicare Other

## 2022-01-19 ENCOUNTER — Other Ambulatory Visit (HOSPITAL_COMMUNITY): Payer: Self-pay | Admitting: Internal Medicine

## 2022-01-19 DIAGNOSIS — Z0000 Encounter for general adult medical examination without abnormal findings: Secondary | ICD-10-CM | POA: Diagnosis not present

## 2022-01-19 NOTE — Progress Notes (Signed)
Subjective:   [name] is a 66 y.o. male who presents for Medicare Annual/Subsequent preventive examination. I connected with  [name] on 01/19/22 by a audio enabled telemedicine application and verified that I am speaking with the correct person using two identifiers.  Patient Location: Home  Provider Location: Office/Clinic  I discussed the limitations of evaluation and management by telemedicine. The patient expressed understanding and agreed to proceed.  Review of Systems     [name] , Thank you for taking time to come for your Medicare Wellness Visit. I appreciate your ongoing commitment to your health goals. Please review the following plan we discussed and let me know if I can assist you in the future.   These are the goals we discussed:  Goals      Weight (lb) < 200 lb (90.7 kg)     Would like to get down below the 200lbs        This is a list of the screening recommended for you and due dates:  Health Maintenance  Topic Date Due   Complete foot exam   Never done   Hepatitis C Screening: USPSTF Recommendation to screen - Ages 90-79 yo.  Never done   Tetanus Vaccine  Never done   Zoster (Shingles) Vaccine (1 of 2) Never done   COVID-19 Vaccine (2 - Janssen risk series) 08/20/2019   Flu Shot  12/02/2021   Yearly kidney function blood test for diabetes  03/18/2022   Hemoglobin A1C  03/20/2022   Eye exam for diabetics  06/18/2022   Yearly kidney health urinalysis for diabetes  01/16/2023   Colon Cancer Screening  02/27/2031   HIV Screening  Completed   HPV Vaccine  Aged Out          Objective:    There were no vitals filed for this visit. There is no height or weight on file to calculate BMI.     04/01/2021    1:50 PM 02/22/2021    4:00 PM 02/22/2021   10:12 AM 12/31/2020    4:14 PM 12/27/2019    9:17 AM 09/26/2019    7:33 AM 09/20/2019    8:13 AM  Advanced Directives  Does Patient Have a Medical Advance Directive? No No No No No No  No  Would patient like information on creating a medical advance directive? Yes (MAU/Ambulatory/Procedural Areas - Information given) No - Patient declined No - Patient declined  Yes (MAU/Ambulatory/Procedural Areas - Information given) No - Patient declined     Current Medications (verified) Outpatient Encounter Medications as of 01/19/2022  Medication Sig   allopurinol (ZYLOPRIM) 100 MG tablet Take 100 mg by mouth daily.    amoxicillin-clavulanate (AUGMENTIN) 875-125 MG tablet Take 1 tablet by mouth every 12 (twelve) hours.   aspirin 81 MG chewable tablet Chew 1 tablet (81 mg total) by mouth daily.   atorvastatin (LIPITOR) 80 MG tablet TAKE 1 TABLET BY MOUTH DAILY AT 6PM   carvedilol (COREG) 12.5 MG tablet Take 1 tablet (12.5 mg total) by mouth 2 (two) times daily with a meal.   chlorhexidine (PERIDEX) 0.12 % solution Use as directed 15 mLs in the mouth or throat 2 (two) times daily.   Cholecalciferol (VITAMIN D) 50 MCG (2000 UT) CAPS Take 2,000 Units by mouth daily with breakfast.   cinacalcet (SENSIPAR) 30 MG tablet Take 30 mg by mouth daily.   colchicine 0.6 MG tablet Take 0.6 mg by mouth daily.   CORLANOR 5 MG TABS tablet TAKE  1 TABLET BY MOUTH TWICE DAILY WITH A MEAL   diclofenac Sodium (VOLTAREN) 1 % GEL Apply 2 g topically 4 (four) times daily.   fluticasone (FLONASE) 50 MCG/ACT nasal spray Place into both nostrils as needed for allergies or rhinitis.   furosemide (LASIX) 40 MG tablet Take 40 mg by mouth as needed.   ibuprofen (ADVIL) 200 MG tablet Take 200 mg by mouth every 6 (six) hours as needed.   lidocaine (XYLOCAINE) 2 % solution Use as directed 10 mLs in the mouth or throat every 3 (three) hours as needed for mouth pain.   nitroGLYCERIN (NITROSTAT) 0.4 MG SL tablet Place 1 tablet (0.4 mg total) under the tongue every 5 (five) minutes as needed.   oxyCODONE-acetaminophen (PERCOCET) 10-325 MG tablet Take 1 tablet by mouth every 12 (twelve) hours as needed for pain.   pantoprazole  (PROTONIX) 40 MG tablet Take 1 tablet (40 mg total) by mouth 2 (two) times daily before a meal.   No facility-administered encounter medications on file as of 01/19/2022.    Allergies (verified) Patient has no known allergies.   History: Past Medical History:  Diagnosis Date   Acute ST elevation myocardial infarction (STEMI) due to occlusion of mid portion of left anterior descending (LAD) coronary artery ([place]) 09/60/4540   Acute systolic heart failure (HCC)    Arthritis    CHF (congestive heart failure) (HCC)    CKD (chronic kidney disease) stage 4, GFR 15-29 ml/min (HCC)    Diabetes mellitus, type II ([place])    Dyspnea    with exertionm periodically   Heart attack ([place]) 03/15/2019   anterior MI    History of kidney stones    Hyperparathyroidism ([place])    Hypertension 2002   Ischemic cardiomyopathy    Left knee pain    Syncope 04/04/2019   after blood draw   Vitamin D deficiency    Past Surgical History:  Procedure Laterality Date   AMPUTATION TOE Left 1991   2 digit of left foot   BIOPSY  02/26/2021   Procedure: BIOPSY;  Surgeon: Rogene Houston, MD;  [facility];  Service: Endoscopy;;   CHOLECYSTECTOMY     COLONOSCOPY     COLONOSCOPY WITH PROPOFOL N/A 02/26/2021   Procedure: COLONOSCOPY WITH PROPOFOL;  Surgeon: Rogene Houston, MD;  Location: [facility];  Service: Endoscopy;  Laterality: N/A;   CORONARY/GRAFT ACUTE MI REVASCULARIZATION N/A 03/12/2019   Procedure: Coronary/Graft Acute MI Revascularization;  Surgeon: Belva Crome, MD;  Location: [facility];  Service: Cardiovascular;  Laterality: N/A;   ESOPHAGOGASTRODUODENOSCOPY (EGD) WITH PROPOFOL N/A 02/26/2021   Procedure: ESOPHAGOGASTRODUODENOSCOPY (EGD) WITH PROPOFOL;  Surgeon: Rogene Houston, MD;  [facility];  Service: Endoscopy;  Laterality: N/A;   ICD IMPLANT N/A 08/24/2019   Procedure: ICD IMPLANT;  Surgeon: Evans Lance, MD;  Location: [facility];  Service:  Cardiovascular;  Laterality: N/A;   IR URETERAL STENT LEFT NEW ACCESS W/O SEP NEPHROSTOMY CATH  09/26/2019   LEFT HEART CATH AND CORONARY ANGIOGRAPHY N/A 03/12/2019   Procedure: LEFT HEART CATH AND CORONARY ANGIOGRAPHY;  Surgeon: Belva Crome, MD;  Location: [facility];  Service: Cardiovascular;  Laterality: N/A;   LITHOTRIPSY Right    NEPHROLITHOTOMY Left 09/26/2019   Procedure: LEFT NEPHROLITHOTOMY PERCUTANEOUS;  Surgeon: Irine Seal, MD;  Location: [facility];  Service: Urology;  Laterality: Left;   Family History  Problem Relation Age of Onset   Heart failure Mother  Heart disease Mother    Cancer Mother        breast   Heart attack Father    Hypertension Father    Heart disease Father    Diabetes Brother    Social History   Socioeconomic History   Marital status: Single    Spouse name: Not on file   Number of children: 0   Years of education: Not on file   Highest education level: Some college, no degree  Occupational History   Occupation: retired  Tobacco Use   Smoking status: Former    Packs/day: 0.50    Years: 40.00    Total pack years: 20.00    Types: Cigarettes    Quit date: 03/12/2019    Years since quitting: 2.8   Smokeless tobacco: Never  Vaping Use   Vaping Use: Never used  Substance and Sexual Activity   Alcohol use: No   Drug use: No   Sexual activity: Not Currently  Other Topics Concern   Not on file  Social History Narrative   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       Enjoys: fishing, shopping, working around the house       Diet: working on changes, avoiding fried foods, increased veggies   Caffeine: teas some daily   Water: 6-8 cups daily       Wears seat belt    Does not use phone while driving    Oceanographer at home    Social Determinants of Health   Financial Resource Strain: [place]  (12/31/2020)   Overall Financial Resource Strain (CARDIA)    Difficulty of Paying Living Expenses: Not hard at all  Food Insecurity: No  Food Insecurity (04/01/2021)   Hunger Vital Sign    Worried About Running Out of Food in the Last Year: Never true    [place] in the Last Year: Never true  Transportation Needs: No Transportation Needs (04/01/2021)   PRAPARE - Hydrologist (Medical): No    Lack of Transportation (Non-Medical): No  Physical Activity: Insufficiently Active (12/31/2020)   Exercise Vital Sign    Days of Exercise per Week: 7 days    Minutes of Exercise per Session: 10 min  Stress: No Stress Concern Present (12/31/2020)   [place]    Feeling of Stress : Not at all  Social Connections: Unknown (12/31/2020)   Social Connection and Isolation Panel [NHANES]    Frequency of Communication with Friends and Family: More than three times a week    Frequency of Social Gatherings with Friends and Family: More than three times a week    Attends Religious Services: More than 4 times per year    Active Member of [employer] or Organizations: No    Attends Archivist Meetings: Never    Marital Status: Not on file    Tobacco Counseling Counseling given: Not Answered   Clinical Intake:                 Diabetic? Yes Nutrition Risk Assessment:  Has the patient had any N/V/D within the last 2 months?  No  Does the patient have any non-healing wounds?  No  Has the patient had any unintentional weight loss or weight gain?  No   Diabetes:  Is the patient diabetic?  Yes  If diabetic, was a CBG obtained today?  No  Did the patient bring in  their glucometer from home?  No  How often do you monitor your CBG's? Patient doesn't check blood sugar.   Financial Strains and Diabetes Management:  Are you having any financial strains with the device, your supplies or your medication? No .  Does the patient want to be seen by Chronic Care Management for management of their diabetes?  No  Would the patient like to  be referred to a Nutritionist or for Diabetic Management?  No   Diabetic Exams:  Diabetic Eye Exam: Completed 06/18/2021.   Diabetic Foot Exam: Pt has been advised about the importance in completing this exam.        Activities of Daily Living    02/22/2021    4:00 PM  In your present state of health, do you have any difficulty performing the following activities:  Hearing? 0  Vision? 0  Difficulty concentrating or making decisions? 0  Walking or climbing stairs? 0  Dressing or bathing? 0  Doing errands, shopping? 1    Patient Care Team: Lindell Spar, MD as PCP - General (Internal Medicine) Belva Crome, MD as PCP - Cardiology (Cardiology)  Indicate any recent Medical Services you may have received from other than [facility] providers in the past year (date may be approximate).     Assessment:   This is a routine wellness examination for [facility].  Hearing/Vision screen No results found.  Dietary issues and exercise activities discussed:     Goals Addressed   None   Depression Screen    09/17/2021   11:16 AM 07/21/2021    9:43 AM 06/10/2021    2:17 PM 04/23/2021   10:20 AM 04/01/2021    1:17 PM 12/31/2020    4:15 PM 08/28/2020    9:11 AM  PHQ 2/9 Scores  PHQ - 2 Score 0 0 0 0 0 0 0    Fall Risk    09/17/2021   11:16 AM 07/21/2021    9:43 AM 06/10/2021    2:17 PM 04/23/2021   10:20 AM 04/01/2021    1:12 PM  [place] in the past year? 0 1 0 0 0  Number falls in past yr: 0 1 0 0   Injury with Fall? 0 1  0   Risk for fall due to : No Fall Risks History of fall(s);Impaired mobility     Follow up Falls evaluation completed Falls evaluation completed;Falls prevention discussed       FALL RISK PREVENTION PERTAINING TO THE HOME:  Any stairs in or around the home? Yes  If so, are there any without handrails? No  Home free of loose throw rugs in walkways, pet beds, electrical cords, etc? Yes  Adequate lighting in your home to reduce risk of falls? Yes    ASSISTIVE DEVICES UTILIZED TO PREVENT FALLS:  Life alert? No  Use of a cane, walker or w/c? No  Grab bars in the bathroom? Yes  Shower chair or bench in shower? No  Elevated toilet seat or a handicapped toilet? Yes    Cognitive Function:    12/27/2019    9:20 AM  MMSE - Mini Mental State Exam  Orientation to time 5  Orientation to Place 5  Registration 3  Attention/ Calculation 5  Recall 3  Language- name 2 objects 2  Language- repeat 1  Language- follow 3 step command 3  Language- read & follow direction 1  Write a sentence 1  Copy design 1  Total score 30  12/31/2020    4:18 PM 12/27/2019    9:20 AM  6CIT Screen  What Year? 0 points 0 points  What month? 0 points 0 points  What time? 0 points 0 points  Count back from 20 0 points 0 points  Months in reverse 0 points 0 points  Repeat phrase 10 points 0 points  Total Score 10 points 0 points    Immunizations Immunization History  Administered Date(s) Administered   Fluad Quad(high Dose 65+) 02/27/2021   Influenza,inj,Quad PF,6+ Mos 12/27/2019   Janssen (J&J) SARS-COV-2 Vaccination 07/23/2019    TDAP status: Due, Education has been provided regarding the importance of this vaccine. Advised may receive this vaccine at local pharmacy or Health Dept. Aware to provide a copy of the vaccination record if obtained from local pharmacy or Health Dept. Verbalized acceptance and understanding.  Flu Vaccine status: Due, Education has been provided regarding the importance of this vaccine. Advised may receive this vaccine at local pharmacy or Health Dept. Aware to provide a copy of the vaccination record if obtained from local pharmacy or Health Dept. Verbalized acceptance and understanding.   Covid-19 vaccine status: Completed vaccines  Qualifies for Shingles Vaccine? Yes   Zostavax completed No   Shingrix Completed?: No.    Education has been provided regarding the importance of this vaccine. Patient has been  advised to call insurance company to determine out of pocket expense if they have not yet received this vaccine. Advised may also receive vaccine at local pharmacy or Health Dept. Verbalized acceptance and understanding.  Screening Tests Health Maintenance  Topic Date Due   FOOT EXAM  Never done   Hepatitis C Screening  Never done   TETANUS/TDAP  Never done   Zoster Vaccines- Shingrix (1 of 2) Never done   COVID-19 Vaccine (2 - Janssen risk series) 08/20/2019   INFLUENZA VACCINE  12/02/2021   Diabetic kidney evaluation - GFR measurement  03/18/2022   HEMOGLOBIN A1C  03/20/2022   OPHTHALMOLOGY EXAM  06/18/2022   Diabetic kidney evaluation - Urine ACR  01/16/2023   COLONOSCOPY (Pts 45-41yrs Insurance coverage will need to be confirmed)  02/27/2031   HIV Screening  Completed   HPV VACCINES  Aged Out    Health Maintenance  Health Maintenance Due  Topic Date Due   FOOT EXAM  Never done   Hepatitis C Screening  Never done   TETANUS/TDAP  Never done   Zoster Vaccines- Shingrix (1 of 2) Never done   COVID-19 Vaccine (2 - Janssen risk series) 08/20/2019   INFLUENZA VACCINE  12/02/2021    Colorectal cancer screening: Type of screening: Colonoscopy. Completed 02/26/21. Repeat every 10 years  Lung Cancer Screening: (Low Dose CT Chest recommended if Age 58-80 years, 30 pack-year currently smoking OR have quit w/in 15years.) does qualify.   Lung Cancer Screening Referral: declined  Additional Screening:  Hepatitis C Screening: does qualify; Completed   Vision Screening: Recommended annual ophthalmology exams for early detection of glaucoma and other disorders of the eye. Is the patient up to date with their annual eye exam?  Yes  Who is the provider or what is the name of the office in which the patient attends annual eye exams? [facility]   Dental Screening: Recommended annual dental exams for proper oral hygiene  Community Resource Referral / Chronic Care Management: CRR  required this visit?  No   CCM required this visit?  No      Plan:     I have personally  reviewed and noted the following in the patient's chart:   Medical and social history Use of alcohol, tobacco or illicit drugs  Current medications and supplements including opioid prescriptions. Patient is not currently taking opioid prescriptions. Functional ability and status Nutritional status Physical activity Advanced directives List of other physicians Hospitalizations, surgeries, and ER visits in previous 12 months Vitals Screenings to include cognitive, depression, and falls Referrals and appointments  In addition, I have reviewed and discussed with patient certain preventive protocols, quality metrics, and best practice recommendations. A written personalized care plan for preventive services as well as general preventive health recommendations were provided to patient.     Johny Drilling, [place]   01/19/2022   Nurse Notes:  Mr. Hilligoss , Thank you for taking time to come for your Medicare Wellness Visit. I appreciate your ongoing commitment to your health goals. Please review the following plan we discussed and let me know if I can assist you in the future.   These are the goals we discussed:  Goals      Weight (lb) < 200 lb (90.7 kg)     Would like to get down below the 200lbs        This is a list of the screening recommended for you and due dates:  Health Maintenance  Topic Date Due   Complete foot exam   Never done   Hepatitis C Screening: USPSTF Recommendation to screen - Ages 59-79 yo.  Never done   Tetanus Vaccine  Never done   Zoster (Shingles) Vaccine (1 of 2) Never done   COVID-19 Vaccine (2 - Janssen risk series) 08/20/2019   Flu Shot  12/02/2021   Yearly kidney function blood test for diabetes  03/18/2022   Hemoglobin A1C  03/20/2022   Eye exam for diabetics  06/18/2022   Yearly kidney health urinalysis for diabetes  01/16/2023   Colon Cancer  Screening  02/27/2031   HIV Screening  Completed   HPV Vaccine  Aged Out

## 2022-01-19 NOTE — Patient Instructions (Signed)
[name] , Thank you for taking time to come for your Medicare Wellness Visit. I appreciate your ongoing commitment to your health goals. Please review the following plan we discussed and let me know if I can assist you in the future.   Screening recommendations/referrals: Colonoscopy: Up to Date Recommended yearly ophthalmology/optometry visit for glaucoma screening and checkup Recommended yearly dental visit for hygiene and checkup  Vaccinations: Influenza vaccine: Due Tdap vaccine: Due Shingles vaccine: Due    Advanced directives: patient declined  Conditions/risks identified: falls, hypertension, diabetes  Next appointment: 1 year  Preventive Care 40-64 Years, Male Preventive care refers to lifestyle choices and visits with your health care provider that can promote health and wellness. What does preventive care include? A yearly physical exam. This is also called an annual well check. Dental exams once or twice a year. Routine eye exams. Ask your health care provider how often you should have your eyes checked. Personal lifestyle choices, including: Daily care of your teeth and gums. Regular physical activity. Eating a healthy diet. Avoiding tobacco and drug use. Limiting alcohol use. Practicing safe sex. Taking low-dose aspirin every day starting at age 23. What happens during an annual well check? The services and screenings done by your health care provider during your annual well check will depend on your age, overall health, lifestyle risk factors, and family history of disease. Counseling  Your health care provider may ask you questions about your: Alcohol use. Tobacco use. Drug use. Emotional well-being. Home and relationship well-being. Sexual activity. Eating habits. Work and work Statistician. Screening  You may have the following tests or measurements: Height, weight, and BMI. Blood pressure. Lipid and cholesterol levels. These may be checked every  5 years, or more frequently if you are over 91 years old. Skin check. Lung cancer screening. You may have this screening every year starting at age 70 if you have a 30-pack-year history of smoking and currently smoke or have quit within the past 15 years. Fecal occult blood test (FOBT) of the stool. You may have this test every year starting at age 23. Flexible sigmoidoscopy or colonoscopy. You may have a sigmoidoscopy every 5 years or a colonoscopy every 10 years starting at age 99. Prostate cancer screening. Recommendations will vary depending on your family history and other risks. Hepatitis C blood test. Hepatitis B blood test. Sexually transmitted disease (STD) testing. Diabetes screening. This is done by checking your blood sugar (glucose) after you have not eaten for a while (fasting). You may have this done every 1-3 years. Discuss your test results, treatment options, and if necessary, the need for more tests with your health care provider. Vaccines  Your health care provider may recommend certain vaccines, such as: Influenza vaccine. This is recommended every year. Tetanus, diphtheria, and acellular pertussis (Tdap, Td) vaccine. You may need a Td booster every 10 years. Zoster vaccine. You may need this after age 71. Pneumococcal 13-valent conjugate (PCV13) vaccine. You may need this if you have certain conditions and have not been vaccinated. Pneumococcal polysaccharide (PPSV23) vaccine. You may need one or two doses if you smoke cigarettes or if you have certain conditions. Talk to your health care provider about which screenings and vaccines you need and how often you need them. This information is not intended to replace advice given to you by your health care provider. Make sure you discuss any questions you have with your health care provider. Document Released: 05/17/2015 Document Revised: 01/08/2016 Document Reviewed: 02/19/2015 Elsevier  Interactive Patient Education  2017  [place] Prevention in the Home Falls can cause injuries. They can happen to people of all ages. There are many things you can do to make your home safe and to help prevent falls. What can I do on the outside of my home? Regularly fix the edges of walkways and driveways and fix any cracks. Remove anything that might make you trip as you walk through a door, such as a raised step or threshold. Trim any bushes or trees on the path to your home. Use bright outdoor lighting. Clear any walking paths of anything that might make someone trip, such as rocks or tools. Regularly check to see if handrails are loose or broken. Make sure that both sides of any steps have handrails. Any raised decks and porches should have guardrails on the edges. Have any leaves, snow, or ice cleared regularly. Use sand or salt on walking paths during winter. Clean up any spills in your garage right away. This includes oil or grease spills. What can I do in the bathroom? Use night lights. Install grab bars by the toilet and in the tub and shower. Do not use towel bars as grab bars. Use non-skid mats or decals in the tub or shower. If you need to sit down in the shower, use a plastic, non-slip stool. Keep the floor dry. Clean up any water that spills on the floor as soon as it happens. Remove soap buildup in the tub or shower regularly. Attach bath mats securely with double-sided non-slip rug tape. Do not have throw rugs and other things on the floor that can make you trip. What can I do in the bedroom? Use night lights. Make sure that you have a light by your bed that is easy to reach. Do not use any sheets or blankets that are too big for your bed. They should not hang down onto the floor. Have a firm chair that has side arms. You can use this for support while you get dressed. Do not have throw rugs and other things on the floor that can make you trip. What can I do in the kitchen? Clean up any spills  right away. Avoid walking on wet floors. Keep items that you use a lot in easy-to-reach places. If you need to reach something above you, use a strong step stool that has a grab bar. Keep electrical cords out of the way. Do not use floor polish or wax that makes floors slippery. If you must use wax, use non-skid floor wax. Do not have throw rugs and other things on the floor that can make you trip. What can I do with my stairs? Do not leave any items on the stairs. Make sure that there are handrails on both sides of the stairs and use them. Fix handrails that are broken or loose. Make sure that handrails are as long as the stairways. Check any carpeting to make sure that it is firmly attached to the stairs. Fix any carpet that is loose or worn. Avoid having throw rugs at the top or bottom of the stairs. If you do have throw rugs, attach them to the floor with carpet tape. Make sure that you have a light switch at the top of the stairs and the bottom of the stairs. If you do not have them, ask someone to add them for you. What else can I do to help prevent falls? Wear shoes that: Do not have high  heels. Have rubber bottoms. Are comfortable and fit you well. Are closed at the toe. Do not wear sandals. If you use a stepladder: Make sure that it is fully opened. Do not climb a closed stepladder. Make sure that both sides of the stepladder are locked into place. Ask someone to hold it for you, if possible. Clearly mark and make sure that you can see: Any grab bars or handrails. First and last steps. Where the edge of each step is. Use tools that help you move around (mobility aids) if they are needed. These include: Canes. Walkers. Scooters. Crutches. Turn on the lights when you go into a dark area. Replace any light bulbs as soon as they burn out. Set up your furniture so you have a clear path. Avoid moving your furniture around. If any of your floors are uneven, fix them. If there are  any pets around you, be aware of where they are. Review your medicines with your doctor. Some medicines can make you feel dizzy. This can increase your chance of falling. Ask your doctor what other things that you can do to help prevent falls. This information is not intended to replace advice given to you by your health care provider. Make sure you discuss any questions you have with your health care provider. Document Released: 02/14/2009 Document Revised: 09/26/2015 Document Reviewed: 05/25/2014 Elsevier Interactive Patient Education  2017 [employer].

## 2022-01-20 ENCOUNTER — Ambulatory Visit: Payer: Medicare Other | Admitting: Internal Medicine

## 2022-01-22 NOTE — Progress Notes (Signed)
PERIOPERATIVE PRESCRIPTION FOR IMPLANTED CARDIAC DEVICE PROGRAMMING  Patient Information: Name:  [name]  DOB:  Sep 19, 1955  MRN:  [id number]    Planned Procedure:  Left Total Knee Arthroplasty.  Surgeon:  Dr. Vickey Huger.  Date of Procedure:  02/02/22  Cautery will be used.  Position during surgery:   (not given)    Device Information:  Clinic EP Physician:  [name], MD   Device Type:  Defibrillator Manufacturer and Phone #:  [employer]: [phone] Pacemaker Dependent?:  No. Date of Last Device Check:  11/28/21 Normal Device Function?:  Yes.    Electrophysiologist's Recommendations:  Have magnet available. Provide continuous ECG monitoring when magnet is used or reprogramming is to be performed.  Procedure should not interfere with device function.  No device programming or magnet placement needed.  Per Device Clinic Standing Orders, Diamond Nickel, RN  3:50 PM 01/22/2022

## 2022-01-22 NOTE — Patient Instructions (Addendum)
DUE TO COVID-19 ONLY TWO VISITORS  (aged 64 and older)  ARE ALLOWED TO COME WITH YOU AND STAY IN THE WAITING ROOM ONLY DURING PRE OP AND PROCEDURE.   **NO VISITORS ARE ALLOWED IN THE SHORT STAY AREA OR RECOVERY ROOM!!**  IF YOU WILL BE ADMITTED INTO THE HOSPITAL YOU ARE ALLOWED ONLY FOUR SUPPORT PEOPLE DURING VISITATION HOURS ONLY (7 AM -8PM)   The support person(s) must pass our screening, gel in and out, and wear a mask at all times, including in the patient's room. Patients must also wear a mask when staff or their support person are in the room. Visitors GUEST BADGE MUST BE WORN VISIBLY  One adult visitor may remain with you overnight and MUST be in the room by 8 P.M.     Your procedure is scheduled on: 02/02/22   Report to [facility] Main Entrance    Report to admitting at : 7:00 AM   Call this number if you have problems the morning of surgery [phone]   Do not eat food :After Midnight.   After Midnight you may have the following liquids until : 6:15 AM DAY OF SURGERY  Water Black Coffee (sugar ok, NO MILK/CREAM OR CREAMERS)  Tea (sugar ok, NO MILK/CREAM OR CREAMERS) regular and decaf                             Plain Jell-O (NO RED)                                           Fruit ices (not with fruit pulp, NO RED)                                     Popsicles (NO RED)                                                                  Juice: apple, WHITE grape, WHITE cranberry Sports drinks like Gatorade (NO RED)   Oral Hygiene is also important to reduce your risk of infection.                                    Remember - BRUSH YOUR TEETH THE MORNING OF SURGERY WITH YOUR REGULAR TOOTHPASTE   Do NOT smoke after Midnight   Take these medicines the morning of surgery with A SIP OF WATER: carvedilol,corlanor,allopurinol,cinacalcet,pantoprazole.  DO NOT TAKE ANY ORAL DIABETIC MEDICATIONS DAY OF YOUR SURGERY  Bring CPAP mask and tubing day of surgery.                               You may not have any metal on your body including hair pins, jewelry, and body piercing             Do not wear lotions, powders, perfumes/cologne, or deodorant  Men may shave face and neck.   Do not bring valuables to the hospital. [place].   Contacts, dentures or bridgework may not be worn into surgery.   Bring small overnight bag day of surgery.   DO NOT [place]. PHARMACY WILL DISPENSE MEDICATIONS LISTED ON YOUR MEDICATION LIST TO YOU DURING YOUR ADMISSION [place]!    Patients discharged on the day of surgery will not be allowed to drive home.  Someone NEEDS to stay with you for the first 24 hours after anesthesia.   Special Instructions: Bring a copy of your healthcare power of attorney and living will documents         the day of surgery if you haven't scanned them before.              Please read over the following fact sheets you were given: IF YOU HAVE QUESTIONS ABOUT YOUR PRE-OP INSTRUCTIONS PLEASE CALL [phone]     [facility] - Preparing for Surgery Before surgery, you can play an important role.  Because skin is not sterile, your skin needs to be as free of germs as possible.  You can reduce the number of germs on your skin by washing with CHG (chlorahexidine gluconate) soap before surgery.  CHG is an antiseptic cleaner which kills germs and bonds with the skin to continue killing germs even after washing. Please DO NOT use if you have an allergy to CHG or antibacterial soaps.  If your skin becomes reddened/irritated stop using the CHG and inform your nurse when you arrive at Short Stay. Do not shave (including legs and underarms) for at least 48 hours prior to the first CHG shower.  You may shave your face/neck. Please follow these instructions carefully:  1.  Shower with CHG Soap the night before surgery and the  morning of Surgery.  2.  If you  choose to wash your hair, wash your hair first as usual with your  normal  shampoo.  3.  After you shampoo, rinse your hair and body thoroughly to remove the  shampoo.                           4.  Use CHG as you would any other liquid soap.  You can apply chg directly  to the skin and wash                       Gently with a scrungie or clean washcloth.  5.  Apply the CHG Soap to your body ONLY FROM THE NECK DOWN.   Do not use on face/ open                           Wound or open sores. Avoid contact with eyes, ears mouth and genitals (private parts).                       Wash face,  Genitals (private parts) with your normal soap.             6.  Wash thoroughly, paying special attention to the area where your surgery  will be performed.  7.  Thoroughly rinse your body with warm water from the neck down.  8.  DO NOT shower/wash with your normal soap after using and rinsing off  the CHG Soap.                9.  Pat yourself dry with a clean towel.            10.  Wear clean pajamas.            11.  Place clean sheets on your bed the night of your first shower and do not  sleep with pets. Day of Surgery : Do not apply any lotions/deodorants the morning of surgery.  Please wear clean clothes to the hospital/surgery center.  FAILURE TO FOLLOW THESE INSTRUCTIONS MAY RESULT IN THE CANCELLATION OF YOUR SURGERY PATIENT SIGNATURE_________________________________  NURSE SIGNATURE__________________________________  ________________________________________________________________________

## 2022-01-23 ENCOUNTER — Other Ambulatory Visit: Payer: Self-pay

## 2022-01-23 ENCOUNTER — Encounter (HOSPITAL_COMMUNITY): Payer: Self-pay

## 2022-01-23 ENCOUNTER — Encounter (HOSPITAL_COMMUNITY)
Admission: RE | Admit: 2022-01-23 | Discharge: 2022-01-23 | Disposition: A | Discharge status: Home / Self Care | Payer: Medicare Other | Source: Ambulatory Visit | Attending: Orthopedic Surgery | Admitting: Orthopedic Surgery

## 2022-01-23 VITALS — BP 154/109 | HR 61 | Temp 97.5°F | Ht 72.0 in | Wt 211.0 lb

## 2022-01-23 DIAGNOSIS — I130 Hypertensive heart and chronic kidney disease with heart failure and stage 1 through stage 4 chronic kidney disease, or unspecified chronic kidney disease: Secondary | ICD-10-CM | POA: Insufficient documentation

## 2022-01-23 DIAGNOSIS — M1712 Unilateral primary osteoarthritis, left knee: Secondary | ICD-10-CM | POA: Insufficient documentation

## 2022-01-23 DIAGNOSIS — E1121 Type 2 diabetes mellitus with diabetic nephropathy: Secondary | ICD-10-CM | POA: Insufficient documentation

## 2022-01-23 DIAGNOSIS — Z01818 Encounter for other preprocedural examination: Secondary | ICD-10-CM

## 2022-01-23 DIAGNOSIS — E1122 Type 2 diabetes mellitus with diabetic chronic kidney disease: Secondary | ICD-10-CM | POA: Insufficient documentation

## 2022-01-23 DIAGNOSIS — I252 Old myocardial infarction: Secondary | ICD-10-CM | POA: Diagnosis not present

## 2022-01-23 DIAGNOSIS — I509 Heart failure, unspecified: Secondary | ICD-10-CM | POA: Insufficient documentation

## 2022-01-23 DIAGNOSIS — Z95810 Presence of automatic (implantable) cardiac defibrillator: Secondary | ICD-10-CM | POA: Insufficient documentation

## 2022-01-23 DIAGNOSIS — I10 Essential (primary) hypertension: Secondary | ICD-10-CM | POA: Diagnosis not present

## 2022-01-23 DIAGNOSIS — Z01812 Encounter for preprocedural laboratory examination: Secondary | ICD-10-CM | POA: Diagnosis not present

## 2022-01-23 DIAGNOSIS — N184 Chronic kidney disease, stage 4 (severe): Secondary | ICD-10-CM | POA: Insufficient documentation

## 2022-01-23 HISTORY — DX: Presence of automatic (implantable) cardiac defibrillator: Z95.810

## 2022-01-23 LAB — BASIC METABOLIC PANEL
Anion gap: 6 (ref 5–15)
BUN: 31 mg/dL — ABNORMAL HIGH (ref 8–23)
CO2: 20 mmol/L — ABNORMAL LOW (ref 22–32)
Calcium: 10 mg/dL (ref 8.9–10.3)
Chloride: 113 mmol/L — ABNORMAL HIGH (ref 98–111)
Creatinine, Ser: 2.86 mg/dL — ABNORMAL HIGH (ref 0.61–1.24)
GFR, Estimated: 24 mL/min — ABNORMAL LOW (ref >60)
Glucose, Bld: 95 mg/dL (ref 70–99)
Potassium: 4.7 mmol/L (ref 3.5–5.1)
Sodium: 139 mmol/L (ref 135–145)

## 2022-01-23 LAB — CBC
HCT: 39.5 % (ref 39.0–52.0)
Hemoglobin: 13.1 g/dL (ref 13.0–17.0)
MCH: 31.2 pg (ref 26.0–34.0)
MCHC: 33.2 g/dL (ref 30.0–36.0)
MCV: 94 fL (ref 80.0–100.0)
Platelets: 199 10*3/uL (ref 150–400)
RBC: 4.2 MIL/uL — ABNORMAL LOW (ref 4.22–5.81)
RDW: 13.9 % (ref 11.5–15.5)
WBC: 5.9 10*3/uL (ref 4.0–10.5)
nRBC: 0 % (ref 0.0–0.2)

## 2022-01-23 LAB — HEMOGLOBIN A1C
Hgb A1c MFr Bld: 5.8 % — ABNORMAL HIGH (ref 4.8–5.6)
Mean Plasma Glucose: 119.76 mg/dL

## 2022-01-23 LAB — SURGICAL PCR SCREEN
MRSA, PCR: NEGATIVE
Staphylococcus aureus: NEGATIVE

## 2022-01-23 LAB — GLUCOSE, CAPILLARY: Glucose-Capillary: 103 mg/dL — ABNORMAL HIGH (ref 70–99)

## 2022-01-23 NOTE — Progress Notes (Signed)
For Short Stay: [place] appointment date: Date of COVID positive in last 52 days:  Bowel Prep reminder:   For Anesthesia: PCP - Dr. Ok Anis Cardiologist - Dr. Daneen Schick  Chest x-ray - 02/22/21 EKG - 03/18/21 Stress Test -  ECHO - 10/02/21 Cardiac Cath -  Pacemaker/ICD device last checked:11/28/21 Pacemaker orders received: Yes: Chart/EPIC Device Rep notified: N/A  Spinal Cord Stimulator:  Sleep Study -  CPAP -   Fasting Blood Sugar - N/A Checks Blood Sugar ___0__ times a day Date and result of last Hgb A1c-  Blood Thinner Instructions: Brillinta: NO instructions yet,pt. Has an appoinment with surgeon office today. Aspirin Instructions: No instructions Last Dose:  Activity level: Can go up a flight of stairs and activities of daily living without stopping and without chest pain and/or shortness of breath   Able to exercise without chest pain and/or shortness of breath   Unable to go up a flight of stairs without chest pain and/or shortness of breath     Anesthesia review: Hx: CAD,CABG,CKD IV,MI,HTN,CHF,DIA  Patient denies shortness of breath, fever, cough and chest pain at PAT appointment   Patient verbalized understanding of instructions that were given to them at the PAT appointment. Patient was also instructed that they will need to review over the PAT instructions again at home before surgery.

## 2022-01-23 NOTE — Progress Notes (Signed)
Creatinine: 2.86

## 2022-01-26 ENCOUNTER — Telehealth (HOSPITAL_COMMUNITY): Payer: Self-pay | Admitting: *Deleted

## 2022-01-26 NOTE — Anesthesia Preprocedure Evaluation (Addendum)
Anesthesia Evaluation  Patient identified by MRN, date of birth, ID band Patient awake    Reviewed: Allergy & Precautions, NPO status , Patient's Chart, lab work & pertinent test results, reviewed documented beta blocker date and time   History of Anesthesia Complications Negative for: history of anesthetic complications  Airway Mallampati: III  TM Distance: >3 FB Neck ROM: Full    Dental  (+) Edentulous Upper, Edentulous Lower   Pulmonary former smoker,    Pulmonary exam normal        Cardiovascular hypertension, Pt. on home beta blockers and Pt. on medications (-) angina+ CAD, + Past MI, + Cardiac Stents and +CHF  Normal cardiovascular exam+ Cardiac Defibrillator    '23 TTE - EF 20 to 25%. The left ventricular internal cavity size was mildly dilated. Grade I diastolic dysfunction (impaired relaxation). Mild mitral valve regurgitation. The tricuspid valve is abnormal, mild TR.   See cardiac clearance note    Neuro/Psych  Headaches, negative psych ROS   GI/Hepatic PUD, (+)     substance abuse  cocaine use,   Endo/Other  diabetes (denies, listed in chart), Type 2  Renal/GU CRFRenal disease     Musculoskeletal  (+) Arthritis ,   Abdominal   Peds  Hematology  (+) Blood dyscrasia, anemia ,  On brilinta, last dose 7 days ago    Anesthesia Other Findings   Reproductive/Obstetrics                          Anesthesia Physical Anesthesia Plan  ASA: 4  Anesthesia Plan: Spinal   Post-op Pain Management: Regional block* and Tylenol PO (pre-op)*   Induction:   PONV Risk Score and Plan: 1 and Treatment may vary due to age or medical condition and Propofol infusion  Airway Management Planned: Natural Airway and Simple Face Mask  Additional Equipment: None  Intra-op Plan:   Post-operative Plan:   Informed Consent: I have reviewed the patients History and Physical, chart, labs and  discussed the procedure including the risks, benefits and alternatives for the proposed anesthesia with the patient or authorized representative who has indicated his/her understanding and acceptance.       Plan Discussed with: CRNA and Anesthesiologist  Anesthesia Plan Comments:       Anesthesia Quick Evaluation

## 2022-01-26 NOTE — Progress Notes (Addendum)
Anesthesia Chart Review   Case: [id number] Date/Time: 02/02/22 0910   Procedure: TOTAL KNEE ARTHROPLASTY (Left: Knee)   Anesthesia type: Spinal   Pre-op diagnosis: Osteoarthritis of left knee M17.12   Location: [facility] ROOM 07 / [facility]   Surgeons: Vickey Huger, MD       DISCUSSION:66 y.o. former smoker with h/o HTN, CAD s/p anterior MI, CHF EF 20-25% on Echo 6/12023 this is stable, AICD in place (device orders in 01/22/22 progress note), DM II, CKD Stage IV, left knee OA scheduled for above procedure 02/02/2022 with Dr. Vickey Huger.   Clearance received from PCP.   Clearance received from cardiology which states pt is moderate risk for planned procedure.   At PAT visit pt with no instructions for holding Brilinta.  Discussed with Dr. Ruel Favors office, they will contact patient.   Pt last seen by nephrology 10/08/2021. Creatinine has been stable around 3.0.   VS: BP (!) 154/109   Pulse 61   Temp (!) 36.4 C (Oral)   Ht 6' (1.829 m)   Wt 95.7 kg   SpO2 98%   BMI 28.62 kg/m   PROVIDERS: Lindell Spar, MD is PCP   Cardiologist - Dr. Daneen Schick LABS: Labs reviewed: Acceptable for surgery. (all labs ordered are listed, but only abnormal results are displayed)  Labs Reviewed  HEMOGLOBIN A1C - Abnormal; Notable for the following components:      Result Value   Hgb A1c MFr Bld 5.8 (*)    All other components within normal limits  BASIC METABOLIC PANEL - Abnormal; Notable for the following components:   Chloride 113 (*)    CO2 20 (*)    BUN 31 (*)    Creatinine, Ser 2.86 (*)    GFR, Estimated 24 (*)    All other components within normal limits  CBC - Abnormal; Notable for the following components:   RBC 4.20 (*)    All other components within normal limits  GLUCOSE, CAPILLARY - Abnormal; Notable for the following components:   Glucose-Capillary 103 (*)    All other components within normal limits  SURGICAL PCR SCREEN     IMAGES:   EKG:   CV: Echo 10/02/2021 1. Left  ventricular ejection fraction, by estimation, is 20 to 25%. The  left ventricle has severely decreased function. The left ventricle  demonstrates regional wall motion abnormalities (see scoring  diagram/findings for description). The left  ventricular internal cavity size was mildly dilated. Left ventricular  diastolic parameters are consistent with Grade I diastolic dysfunction  (impaired relaxation).   2. Right ventricular systolic function is normal. The right ventricular  size is normal. There is normal pulmonary artery systolic pressure.   3. The mitral valve is abnormal. Mild mitral valve regurgitation. No  evidence of mitral stenosis.   4. The tricuspid valve is abnormal.   5. The aortic valve is tricuspid. Aortic valve regurgitation is not  visualized. No aortic stenosis is present.   6. IVC appears small suggesting low RA pressure and hypovolemia.  Past Medical History:  Diagnosis Date   Acute ST elevation myocardial infarction (STEMI) due to occlusion of mid portion of left anterior descending (LAD) coronary artery ([place]) 69/62/9528   Acute systolic heart failure (HCC)    AICD (automatic cardioverter/defibrillator) present    Arthritis    CHF (congestive heart failure) (HCC)    CKD (chronic kidney disease) stage 4, GFR 15-29 ml/min (HCC)    Diabetes mellitus, type II (HCC)    Dyspnea  with exertionm periodically   Heart attack ([place]) 03/15/2019   anterior MI    History of kidney stones    Hyperparathyroidism ([place])    Hypertension 2002   Ischemic cardiomyopathy    Left knee pain    Syncope 04/04/2019   after blood draw   Vitamin D deficiency     Past Surgical History:  Procedure Laterality Date   AMPUTATION TOE Left 1991   2 digit of left foot   BIOPSY  02/26/2021   Procedure: BIOPSY;  Surgeon: Rogene Houston, MD;  [facility];  Service: Endoscopy;;   CHOLECYSTECTOMY     COLONOSCOPY     COLONOSCOPY WITH PROPOFOL N/A 02/26/2021   Procedure:  COLONOSCOPY WITH PROPOFOL;  Surgeon: Rogene Houston, MD;  [facility];  Service: Endoscopy;  Laterality: N/A;   CORONARY/GRAFT ACUTE MI REVASCULARIZATION N/A 03/12/2019   Procedure: Coronary/Graft Acute MI Revascularization;  Surgeon: Belva Crome, MD;  Location: [place] [facility];  Service: Cardiovascular;  Laterality: N/A;   ESOPHAGOGASTRODUODENOSCOPY (EGD) WITH PROPOFOL N/A 02/26/2021   Procedure: ESOPHAGOGASTRODUODENOSCOPY (EGD) WITH PROPOFOL;  Surgeon: Rogene Houston, MD;  [facility];  Service: Endoscopy;  Laterality: N/A;   ICD IMPLANT N/A 08/24/2019   Procedure: ICD IMPLANT;  Surgeon: Evans Lance, MD;  Location: [place] [facility];  Service: Cardiovascular;  Laterality: N/A;   IR URETERAL STENT LEFT NEW ACCESS W/O SEP NEPHROSTOMY CATH  09/26/2019   LEFT HEART CATH AND CORONARY ANGIOGRAPHY N/A 03/12/2019   Procedure: LEFT HEART CATH AND CORONARY ANGIOGRAPHY;  Surgeon: Belva Crome, MD;  Location: [facility] [facility];  Service: Cardiovascular;  Laterality: N/A;   LITHOTRIPSY Right    NEPHROLITHOTOMY Left 09/26/2019   Procedure: LEFT NEPHROLITHOTOMY PERCUTANEOUS;  Surgeon: Irine Seal, MD;  [facility];  Service: Urology;  Laterality: Left;    MEDICATIONS:  allopurinol (ZYLOPRIM) 100 MG tablet   amoxicillin-clavulanate (AUGMENTIN) 875-125 MG tablet   aspirin 81 MG chewable tablet   atorvastatin (LIPITOR) 80 MG tablet   BRILINTA 60 MG TABS tablet   carvedilol (COREG) 12.5 MG tablet   chlorhexidine (PERIDEX) 0.12 % solution   Cholecalciferol (VITAMIN D) 50 MCG (2000 UT) CAPS   cinacalcet (SENSIPAR) 30 MG tablet   CORLANOR 5 MG TABS tablet   diclofenac Sodium (VOLTAREN) 1 % GEL   furosemide (LASIX) 40 MG tablet   lidocaine (XYLOCAINE) 2 % solution   nitroGLYCERIN (NITROSTAT) 0.4 MG SL tablet   oxyCODONE-acetaminophen (PERCOCET) 10-325 MG tablet   pantoprazole (PROTONIX) 40 MG tablet   sodium bicarbonate 650 MG tablet   No current  facility-administered medications for this encounter.     Konrad Felix Ward, PA-C [facility] [phone]

## 2022-01-26 NOTE — Telephone Encounter (Signed)
Courtney with surgeons office called stating pt is scheduled for total knee replacement Monday 10/2 she asked if it is ok for pt to hold Brilinta 5-7 days prior to surgery.   Routed to [place] for advice  Call back # [phone]

## 2022-01-28 ENCOUNTER — Other Ambulatory Visit: Payer: Self-pay | Admitting: Orthopedic Surgery

## 2022-01-28 ENCOUNTER — Ambulatory Visit: Payer: Medicare Other

## 2022-01-28 DIAGNOSIS — G8929 Other chronic pain: Secondary | ICD-10-CM

## 2022-01-29 ENCOUNTER — Ambulatory Visit: Payer: Medicare Other | Admitting: Internal Medicine

## 2022-01-29 ENCOUNTER — Ambulatory Visit (INDEPENDENT_AMBULATORY_CARE_PROVIDER_SITE_OTHER): Payer: Medicare Other

## 2022-01-29 DIAGNOSIS — Z23 Encounter for immunization: Secondary | ICD-10-CM | POA: Diagnosis not present

## 2022-01-31 DIAGNOSIS — R809 Proteinuria, unspecified: Secondary | ICD-10-CM | POA: Diagnosis not present

## 2022-01-31 DIAGNOSIS — E8722 Chronic metabolic acidosis: Secondary | ICD-10-CM | POA: Diagnosis not present

## 2022-01-31 DIAGNOSIS — E210 Primary hyperparathyroidism: Secondary | ICD-10-CM | POA: Diagnosis not present

## 2022-01-31 DIAGNOSIS — N184 Chronic kidney disease, stage 4 (severe): Secondary | ICD-10-CM | POA: Diagnosis not present

## 2022-01-31 DIAGNOSIS — D638 Anemia in other chronic diseases classified elsewhere: Secondary | ICD-10-CM | POA: Diagnosis not present

## 2022-01-31 DIAGNOSIS — I129 Hypertensive chronic kidney disease with stage 1 through stage 4 chronic kidney disease, or unspecified chronic kidney disease: Secondary | ICD-10-CM | POA: Diagnosis not present

## 2022-01-31 DIAGNOSIS — I5022 Chronic systolic (congestive) heart failure: Secondary | ICD-10-CM | POA: Diagnosis not present

## 2022-02-02 ENCOUNTER — Encounter (HOSPITAL_COMMUNITY)
Admission: AD | Disposition: A | Discharge status: Home Health | Payer: Self-pay | Source: Home / Self Care | Attending: Orthopedic Surgery

## 2022-02-02 ENCOUNTER — Other Ambulatory Visit: Payer: Self-pay

## 2022-02-02 ENCOUNTER — Inpatient Hospital Stay (HOSPITAL_COMMUNITY)
Admission: AD | Admit: 2022-02-02 | Discharge: 2022-02-06 | DRG: 470 | Disposition: A | Discharge status: Home Health | Payer: Medicare Other | Attending: Orthopedic Surgery | Admitting: Orthopedic Surgery

## 2022-02-02 ENCOUNTER — Ambulatory Visit (HOSPITAL_BASED_OUTPATIENT_CLINIC_OR_DEPARTMENT_OTHER): Payer: Medicare Other | Admitting: Certified Registered Nurse Anesthetist

## 2022-02-02 ENCOUNTER — Encounter (HOSPITAL_COMMUNITY): Payer: Self-pay | Admitting: Orthopedic Surgery

## 2022-02-02 ENCOUNTER — Ambulatory Visit (HOSPITAL_COMMUNITY): Payer: Medicare Other | Admitting: Physician Assistant

## 2022-02-02 DIAGNOSIS — I2510 Atherosclerotic heart disease of native coronary artery without angina pectoris: Secondary | ICD-10-CM | POA: Diagnosis not present

## 2022-02-02 DIAGNOSIS — Z8249 Family history of ischemic heart disease and other diseases of the circulatory system: Secondary | ICD-10-CM

## 2022-02-02 DIAGNOSIS — I5022 Chronic systolic (congestive) heart failure: Secondary | ICD-10-CM | POA: Diagnosis present

## 2022-02-02 DIAGNOSIS — N189 Chronic kidney disease, unspecified: Secondary | ICD-10-CM | POA: Diagnosis not present

## 2022-02-02 DIAGNOSIS — N184 Chronic kidney disease, stage 4 (severe): Secondary | ICD-10-CM | POA: Diagnosis present

## 2022-02-02 DIAGNOSIS — I252 Old myocardial infarction: Secondary | ICD-10-CM

## 2022-02-02 DIAGNOSIS — G8929 Other chronic pain: Secondary | ICD-10-CM

## 2022-02-02 DIAGNOSIS — E1121 Type 2 diabetes mellitus with diabetic nephropathy: Secondary | ICD-10-CM | POA: Diagnosis present

## 2022-02-02 DIAGNOSIS — Z803 Family history of malignant neoplasm of breast: Secondary | ICD-10-CM | POA: Diagnosis not present

## 2022-02-02 DIAGNOSIS — Z87891 Personal history of nicotine dependence: Secondary | ICD-10-CM | POA: Diagnosis not present

## 2022-02-02 DIAGNOSIS — I255 Ischemic cardiomyopathy: Secondary | ICD-10-CM | POA: Diagnosis not present

## 2022-02-02 DIAGNOSIS — Z7982 Long term (current) use of aspirin: Secondary | ICD-10-CM

## 2022-02-02 DIAGNOSIS — M1712 Unilateral primary osteoarthritis, left knee: Principal | ICD-10-CM | POA: Diagnosis present

## 2022-02-02 DIAGNOSIS — I130 Hypertensive heart and chronic kidney disease with heart failure and stage 1 through stage 4 chronic kidney disease, or unspecified chronic kidney disease: Secondary | ICD-10-CM | POA: Diagnosis not present

## 2022-02-02 DIAGNOSIS — E785 Hyperlipidemia, unspecified: Secondary | ICD-10-CM | POA: Diagnosis present

## 2022-02-02 DIAGNOSIS — E559 Vitamin D deficiency, unspecified: Secondary | ICD-10-CM | POA: Diagnosis not present

## 2022-02-02 DIAGNOSIS — Z833 Family history of diabetes mellitus: Secondary | ICD-10-CM

## 2022-02-02 DIAGNOSIS — E1122 Type 2 diabetes mellitus with diabetic chronic kidney disease: Secondary | ICD-10-CM | POA: Diagnosis present

## 2022-02-02 DIAGNOSIS — Z96659 Presence of unspecified artificial knee joint: Principal | ICD-10-CM

## 2022-02-02 DIAGNOSIS — I509 Heart failure, unspecified: Secondary | ICD-10-CM | POA: Diagnosis not present

## 2022-02-02 DIAGNOSIS — Z79899 Other long term (current) drug therapy: Secondary | ICD-10-CM

## 2022-02-02 DIAGNOSIS — Z7902 Long term (current) use of antithrombotics/antiplatelets: Secondary | ICD-10-CM

## 2022-02-02 DIAGNOSIS — Z95810 Presence of automatic (implantable) cardiac defibrillator: Secondary | ICD-10-CM | POA: Diagnosis not present

## 2022-02-02 DIAGNOSIS — G8918 Other acute postprocedural pain: Secondary | ICD-10-CM | POA: Diagnosis not present

## 2022-02-02 HISTORY — PX: TOTAL KNEE ARTHROPLASTY: SHX125

## 2022-02-02 LAB — COMPREHENSIVE METABOLIC PANEL
ALT: 15 U/L (ref 0–44)
AST: 19 U/L (ref 15–41)
Albumin: 3.3 g/dL — ABNORMAL LOW (ref 3.5–5.0)
Alkaline Phosphatase: 95 U/L (ref 38–126)
Anion gap: 5 (ref 5–15)
BUN: 29 mg/dL — ABNORMAL HIGH (ref 8–23)
CO2: 22 mmol/L (ref 22–32)
Calcium: 10.5 mg/dL — ABNORMAL HIGH (ref 8.9–10.3)
Chloride: 113 mmol/L — ABNORMAL HIGH (ref 98–111)
Creatinine, Ser: 2.86 mg/dL — ABNORMAL HIGH (ref 0.61–1.24)
GFR, Estimated: 24 mL/min — ABNORMAL LOW (ref >60)
Glucose, Bld: 105 mg/dL — ABNORMAL HIGH (ref 70–99)
Potassium: 4.3 mmol/L (ref 3.5–5.1)
Sodium: 140 mmol/L (ref 135–145)
Total Bilirubin: 1.4 mg/dL — ABNORMAL HIGH (ref 0.3–1.2)
Total Protein: 7 g/dL (ref 6.5–8.1)

## 2022-02-02 LAB — CBC WITH DIFFERENTIAL/PLATELET
Abs Immature Granulocytes: 0.01 10*3/uL (ref 0.00–0.07)
Basophils Absolute: 0 10*3/uL (ref 0.0–0.1)
Basophils Relative: 0 %
Eosinophils Absolute: 0.2 10*3/uL (ref 0.0–0.5)
Eosinophils Relative: 4 %
HCT: 35.1 % — ABNORMAL LOW (ref 39.0–52.0)
Hemoglobin: 11.6 g/dL — ABNORMAL LOW (ref 13.0–17.0)
Immature Granulocytes: 0 %
Lymphocytes Relative: 33 %
Lymphs Abs: 1.5 10*3/uL (ref 0.7–4.0)
MCH: 31.4 pg (ref 26.0–34.0)
MCHC: 33 g/dL (ref 30.0–36.0)
MCV: 95.1 fL (ref 80.0–100.0)
Monocytes Absolute: 0.6 10*3/uL (ref 0.1–1.0)
Monocytes Relative: 12 %
Neutro Abs: 2.4 10*3/uL (ref 1.7–7.7)
Neutrophils Relative %: 51 %
Platelets: 163 10*3/uL (ref 150–400)
RBC: 3.69 MIL/uL — ABNORMAL LOW (ref 4.22–5.81)
RDW: 14.1 % (ref 11.5–15.5)
WBC: 4.7 10*3/uL (ref 4.0–10.5)
nRBC: 0 % (ref 0.0–0.2)

## 2022-02-02 LAB — GLUCOSE, CAPILLARY: Glucose-Capillary: 112 mg/dL — ABNORMAL HIGH (ref 70–99)

## 2022-02-02 SURGERY — ARTHROPLASTY, KNEE, TOTAL
Anesthesia: Spinal | Site: Knee | Laterality: Left

## 2022-02-02 MED ORDER — ASPIRIN 81 MG PO CHEW
81.0000 mg | CHEWABLE_TABLET | Freq: Every day | ORAL | Status: DC
  Administered 2022-02-03 – 2022-02-06 (×4): 81 mg via ORAL
  Filled 2022-02-02 (×4): qty 1

## 2022-02-02 MED ORDER — TICAGRELOR 60 MG PO TABS
60.0000 mg | ORAL_TABLET | Freq: Two times a day (BID) | ORAL | Status: DC
  Administered 2022-02-03 – 2022-02-06 (×7): 60 mg via ORAL
  Filled 2022-02-02 (×7): qty 1

## 2022-02-02 MED ORDER — DEXAMETHASONE SODIUM PHOSPHATE 10 MG/ML IJ SOLN
8.0000 mg | Freq: Once | INTRAMUSCULAR | Status: AC
  Administered 2022-02-02: 5 mg via INTRAVENOUS

## 2022-02-02 MED ORDER — MIDAZOLAM HCL 2 MG/2ML IJ SOLN
INTRAMUSCULAR | Status: AC
  Filled 2022-02-02: qty 2

## 2022-02-02 MED ORDER — METHOCARBAMOL 1000 MG/10ML IJ SOLN: 500.0000 mg | Freq: Four times a day (QID) | INTRAVENOUS | Status: DC | PRN

## 2022-02-02 MED ORDER — SODIUM CHLORIDE (PF) 0.9 % IJ SOLN
INTRAMUSCULAR | Status: AC
  Filled 2022-02-02: qty 20

## 2022-02-02 MED ORDER — ORAL CARE MOUTH RINSE: 15.0000 mL | Freq: Once | OROMUCOSAL | Status: AC

## 2022-02-02 MED ORDER — ALLOPURINOL 100 MG PO TABS
100.0000 mg | ORAL_TABLET | Freq: Every day | ORAL | Status: DC
  Administered 2022-02-03 – 2022-02-06 (×4): 100 mg via ORAL
  Filled 2022-02-02 (×4): qty 1

## 2022-02-02 MED ORDER — ACETAMINOPHEN 325 MG PO TABS
325.0000 mg | ORAL_TABLET | Freq: Four times a day (QID) | ORAL | Status: DC | PRN
  Administered 2022-02-03 – 2022-02-06 (×4): 650 mg via ORAL
  Filled 2022-02-02 (×4): qty 2

## 2022-02-02 MED ORDER — SENNOSIDES-DOCUSATE SODIUM 8.6-50 MG PO TABS: 1.0000 | ORAL_TABLET | Freq: Every evening | ORAL | Status: DC | PRN

## 2022-02-02 MED ORDER — OXYCODONE HCL 5 MG/5ML PO SOLN: 5.0000 mg | Freq: Once | ORAL | Status: DC | PRN

## 2022-02-02 MED ORDER — OXYCODONE HCL 5 MG PO TABS: 5.0000 mg | ORAL_TABLET | Freq: Once | ORAL | Status: DC | PRN

## 2022-02-02 MED ORDER — FLEET ENEMA 7-19 GM/118ML RE ENEM: 1.0000 | ENEMA | Freq: Once | RECTAL | Status: DC | PRN

## 2022-02-02 MED ORDER — TRANEXAMIC ACID-NACL 1000-0.7 MG/100ML-% IV SOLN
1000.0000 mg | INTRAVENOUS | Status: AC
  Administered 2022-02-02: 1000 mg via INTRAVENOUS
  Filled 2022-02-02: qty 100

## 2022-02-02 MED ORDER — PROPOFOL 10 MG/ML IV BOLUS
INTRAVENOUS | Status: AC
  Filled 2022-02-02: qty 20

## 2022-02-02 MED ORDER — DOCUSATE SODIUM 100 MG PO CAPS
100.0000 mg | ORAL_CAPSULE | Freq: Two times a day (BID) | ORAL | Status: DC
  Administered 2022-02-02 – 2022-02-06 (×8): 100 mg via ORAL
  Filled 2022-02-02 (×8): qty 1

## 2022-02-02 MED ORDER — MIDAZOLAM HCL 5 MG/5ML IJ SOLN
INTRAMUSCULAR | Status: DC | PRN
  Administered 2022-02-02 (×2): 1 mg via INTRAVENOUS

## 2022-02-02 MED ORDER — DEXAMETHASONE SODIUM PHOSPHATE 10 MG/ML IJ SOLN
10.0000 mg | Freq: Once | INTRAMUSCULAR | Status: AC
  Administered 2022-02-03: 10 mg via INTRAVENOUS
  Filled 2022-02-02: qty 1

## 2022-02-02 MED ORDER — PROPOFOL 500 MG/50ML IV EMUL
INTRAVENOUS | Status: DC | PRN
  Administered 2022-02-02: 45 ug/kg/min via INTRAVENOUS

## 2022-02-02 MED ORDER — 0.9 % SODIUM CHLORIDE (POUR BTL) OPTIME
TOPICAL | Status: DC | PRN
  Administered 2022-02-02: 1000 mL

## 2022-02-02 MED ORDER — VASOPRESSIN 20 UNIT/ML IV SOLN
INTRAVENOUS | Status: AC
  Filled 2022-02-02: qty 1

## 2022-02-02 MED ORDER — POVIDONE-IODINE 10 % EX SWAB
2.0000 | Freq: Once | CUTANEOUS | Status: AC
  Administered 2022-02-02: 2 via TOPICAL

## 2022-02-02 MED ORDER — PHENYLEPHRINE 80 MCG/ML (10ML) SYRINGE FOR IV PUSH (FOR BLOOD PRESSURE SUPPORT)
PREFILLED_SYRINGE | INTRAVENOUS | Status: DC | PRN
  Administered 2022-02-02 (×3): 80 ug via INTRAVENOUS
  Administered 2022-02-02 (×2): 40 ug via INTRAVENOUS

## 2022-02-02 MED ORDER — SODIUM CHLORIDE 0.9 % IV SOLN: INTRAVENOUS | Status: DC

## 2022-02-02 MED ORDER — PHENYLEPHRINE HCL-NACL 20-0.9 MG/250ML-% IV SOLN
INTRAVENOUS | Status: AC
  Filled 2022-02-02: qty 250

## 2022-02-02 MED ORDER — ACETAMINOPHEN 500 MG PO TABS: 1000.0000 mg | ORAL_TABLET | Freq: Once | ORAL | Status: DC

## 2022-02-02 MED ORDER — CARVEDILOL 12.5 MG PO TABS
12.5000 mg | ORAL_TABLET | Freq: Two times a day (BID) | ORAL | Status: DC
  Administered 2022-02-02 – 2022-02-06 (×8): 12.5 mg via ORAL
  Filled 2022-02-02 (×8): qty 1

## 2022-02-02 MED ORDER — ONDANSETRON HCL 4 MG/2ML IJ SOLN
4.0000 mg | Freq: Four times a day (QID) | INTRAMUSCULAR | Status: DC | PRN
  Administered 2022-02-03 – 2022-02-04 (×2): 4 mg via INTRAVENOUS
  Filled 2022-02-02 (×2): qty 2

## 2022-02-02 MED ORDER — BUPIVACAINE LIPOSOME 1.3 % IJ SUSP
INTRAMUSCULAR | Status: AC
  Filled 2022-02-02: qty 20

## 2022-02-02 MED ORDER — BUPIVACAINE HCL (PF) 0.5 % IJ SOLN
INTRAMUSCULAR | Status: DC | PRN
  Administered 2022-02-02: 2.4 mL via INTRATHECAL

## 2022-02-02 MED ORDER — METOCLOPRAMIDE HCL 5 MG/ML IJ SOLN: 5.0000 mg | Freq: Three times a day (TID) | INTRAMUSCULAR | Status: DC | PRN

## 2022-02-02 MED ORDER — BUPIVACAINE-EPINEPHRINE 0.25% -1:200000 IJ SOLN
INTRAMUSCULAR | Status: DC | PRN
  Administered 2022-02-02: 30 mL

## 2022-02-02 MED ORDER — METHOCARBAMOL 500 MG PO TABS
500.0000 mg | ORAL_TABLET | Freq: Four times a day (QID) | ORAL | Status: DC | PRN
  Administered 2022-02-02 – 2022-02-05 (×9): 500 mg via ORAL
  Filled 2022-02-02 (×9): qty 1

## 2022-02-02 MED ORDER — CEFAZOLIN SODIUM-DEXTROSE 2-4 GM/100ML-% IV SOLN
2.0000 g | INTRAVENOUS | Status: AC
  Administered 2022-02-02: 2 g via INTRAVENOUS
  Filled 2022-02-02: qty 100

## 2022-02-02 MED ORDER — ROPIVACAINE HCL 7.5 MG/ML IJ SOLN
INTRAMUSCULAR | Status: DC | PRN
  Administered 2022-02-02: 20 mL via PERINEURAL

## 2022-02-02 MED ORDER — CHLORHEXIDINE GLUCONATE 0.12 % MT SOLN
15.0000 mL | Freq: Once | OROMUCOSAL | Status: AC
  Administered 2022-02-02: 15 mL via OROMUCOSAL

## 2022-02-02 MED ORDER — BUPIVACAINE LIPOSOME 1.3 % IJ SUSP: 20.0000 mL | Freq: Once | INTRAMUSCULAR | Status: DC

## 2022-02-02 MED ORDER — OXYCODONE HCL ER 10 MG PO T12A
10.0000 mg | EXTENDED_RELEASE_TABLET | Freq: Two times a day (BID) | ORAL | Status: DC
  Administered 2022-02-02 – 2022-02-06 (×9): 10 mg via ORAL
  Filled 2022-02-02 (×9): qty 1

## 2022-02-02 MED ORDER — ALUM & MAG HYDROXIDE-SIMETH 200-200-20 MG/5ML PO SUSP: 30.0000 mL | ORAL | Status: DC | PRN

## 2022-02-02 MED ORDER — PANTOPRAZOLE SODIUM 40 MG PO TBEC: 40.0000 mg | DELAYED_RELEASE_TABLET | Freq: Every day | ORAL | Status: DC

## 2022-02-02 MED ORDER — DEXAMETHASONE SODIUM PHOSPHATE 10 MG/ML IJ SOLN
INTRAMUSCULAR | Status: AC
  Filled 2022-02-02: qty 1

## 2022-02-02 MED ORDER — LACTATED RINGERS IV SOLN: INTRAVENOUS | Status: DC

## 2022-02-02 MED ORDER — ONDANSETRON HCL 4 MG/2ML IJ SOLN
INTRAMUSCULAR | Status: DC | PRN
  Administered 2022-02-02: 4 mg via INTRAVENOUS

## 2022-02-02 MED ORDER — ACETAMINOPHEN 500 MG PO TABS
1000.0000 mg | ORAL_TABLET | Freq: Once | ORAL | Status: AC
  Administered 2022-02-02: 1000 mg via ORAL
  Filled 2022-02-02: qty 2

## 2022-02-02 MED ORDER — PANTOPRAZOLE SODIUM 40 MG PO TBEC
40.0000 mg | DELAYED_RELEASE_TABLET | Freq: Two times a day (BID) | ORAL | Status: DC
  Administered 2022-02-02 – 2022-02-06 (×8): 40 mg via ORAL
  Filled 2022-02-02 (×8): qty 1

## 2022-02-02 MED ORDER — BUPIVACAINE LIPOSOME 1.3 % IJ SUSP
INTRAMUSCULAR | Status: DC | PRN
  Administered 2022-02-02: 20 mL

## 2022-02-02 MED ORDER — WATER FOR IRRIGATION, STERILE IR SOLN
Status: DC | PRN
  Administered 2022-02-02: 2000 mL

## 2022-02-02 MED ORDER — DIPHENHYDRAMINE HCL 12.5 MG/5ML PO ELIX: 12.5000 mg | ORAL_SOLUTION | ORAL | Status: DC | PRN

## 2022-02-02 MED ORDER — ATORVASTATIN CALCIUM 40 MG PO TABS
80.0000 mg | ORAL_TABLET | Freq: Every day | ORAL | Status: DC
  Administered 2022-02-02 – 2022-02-05 (×4): 80 mg via ORAL
  Filled 2022-02-02 (×4): qty 2

## 2022-02-02 MED ORDER — ONDANSETRON HCL 4 MG/2ML IJ SOLN
INTRAMUSCULAR | Status: AC
  Filled 2022-02-02: qty 2

## 2022-02-02 MED ORDER — PHENOL 1.4 % MT LIQD: 1.0000 | OROMUCOSAL | Status: DC | PRN

## 2022-02-02 MED ORDER — SODIUM BICARBONATE 650 MG PO TABS
650.0000 mg | ORAL_TABLET | Freq: Two times a day (BID) | ORAL | Status: DC
  Administered 2022-02-02 – 2022-02-06 (×8): 650 mg via ORAL
  Filled 2022-02-02 (×8): qty 1

## 2022-02-02 MED ORDER — CINACALCET HCL 30 MG PO TABS
30.0000 mg | ORAL_TABLET | Freq: Every day | ORAL | Status: DC
  Administered 2022-02-03 – 2022-02-06 (×4): 30 mg via ORAL
  Filled 2022-02-02 (×4): qty 1

## 2022-02-02 MED ORDER — ZOLPIDEM TARTRATE 5 MG PO TABS: 5.0000 mg | ORAL_TABLET | Freq: Every evening | ORAL | Status: DC | PRN

## 2022-02-02 MED ORDER — FERROUS SULFATE 325 (65 FE) MG PO TABS
325.0000 mg | ORAL_TABLET | Freq: Three times a day (TID) | ORAL | Status: DC
  Administered 2022-02-03 – 2022-02-06 (×2): 325 mg via ORAL
  Filled 2022-02-02 (×2): qty 1

## 2022-02-02 MED ORDER — FENTANYL CITRATE PF 50 MCG/ML IJ SOSY: 25.0000 ug | PREFILLED_SYRINGE | INTRAMUSCULAR | Status: DC | PRN

## 2022-02-02 MED ORDER — BISACODYL 5 MG PO TBEC: 5.0000 mg | DELAYED_RELEASE_TABLET | Freq: Every day | ORAL | Status: DC | PRN

## 2022-02-02 MED ORDER — METOCLOPRAMIDE HCL 5 MG PO TABS: 5.0000 mg | ORAL_TABLET | Freq: Three times a day (TID) | ORAL | Status: DC | PRN

## 2022-02-02 MED ORDER — ONDANSETRON HCL 4 MG/2ML IJ SOLN: 4.0000 mg | Freq: Once | INTRAMUSCULAR | Status: DC | PRN

## 2022-02-02 MED ORDER — ONDANSETRON HCL 4 MG PO TABS
4.0000 mg | ORAL_TABLET | Freq: Four times a day (QID) | ORAL | Status: DC | PRN
  Filled 2022-02-02: qty 1

## 2022-02-02 MED ORDER — SODIUM CHLORIDE 0.9 % IR SOLN
Status: DC | PRN
  Administered 2022-02-02: 1000 mL

## 2022-02-02 MED ORDER — GABAPENTIN 300 MG PO CAPS
300.0000 mg | ORAL_CAPSULE | Freq: Once | ORAL | Status: DC
  Filled 2022-02-02: qty 1

## 2022-02-02 MED ORDER — OXYCODONE HCL 5 MG PO TABS
5.0000 mg | ORAL_TABLET | ORAL | Status: DC | PRN
  Administered 2022-02-02 – 2022-02-03 (×4): 10 mg via ORAL
  Filled 2022-02-02 (×6): qty 2

## 2022-02-02 MED ORDER — MENTHOL 3 MG MT LOZG: 1.0000 | LOZENGE | OROMUCOSAL | Status: DC | PRN

## 2022-02-02 MED ORDER — PROPOFOL 10 MG/ML IV BOLUS
INTRAVENOUS | Status: DC | PRN
  Administered 2022-02-02: 20 mg via INTRAVENOUS
  Administered 2022-02-02 (×3): 10 mg via INTRAVENOUS

## 2022-02-02 MED ORDER — FUROSEMIDE 40 MG PO TABS
40.0000 mg | ORAL_TABLET | Freq: Every day | ORAL | Status: DC | PRN
  Administered 2022-02-03 – 2022-02-05 (×2): 40 mg via ORAL
  Filled 2022-02-02 (×3): qty 1

## 2022-02-02 MED ORDER — BUPIVACAINE-EPINEPHRINE (PF) 0.25% -1:200000 IJ SOLN
INTRAMUSCULAR | Status: AC
  Filled 2022-02-02: qty 30

## 2022-02-02 MED ORDER — IVABRADINE HCL 5 MG PO TABS
5.0000 mg | ORAL_TABLET | Freq: Two times a day (BID) | ORAL | Status: DC
  Administered 2022-02-02 – 2022-02-06 (×8): 5 mg via ORAL
  Filled 2022-02-02 (×10): qty 1

## 2022-02-02 MED ORDER — SODIUM CHLORIDE 0.9% FLUSH
INTRAVENOUS | Status: DC | PRN
  Administered 2022-02-02: 20 mL

## 2022-02-02 MED ORDER — HYDROMORPHONE HCL 1 MG/ML IJ SOLN
0.5000 mg | INTRAMUSCULAR | Status: DC | PRN
  Administered 2022-02-02: 1 mg via INTRAVENOUS
  Administered 2022-02-03: 0.5 mg via INTRAVENOUS
  Administered 2022-02-03: 1 mg via INTRAVENOUS
  Administered 2022-02-03: 0.5 mg via INTRAVENOUS
  Filled 2022-02-02 (×4): qty 1

## 2022-02-02 SURGICAL SUPPLY — 61 items
ARTISURF 10M PLYL 10-12GH KNEE (Knees) IMPLANT
BAG COUNTER SPONGE SURGICOUNT (BAG) IMPLANT
BAG SPEC THK2 15X12 ZIP CLS (MISCELLANEOUS) ×1
BAG SPNG CNTER NS LX DISP (BAG)
BAG ZIPLOCK 12X15 (MISCELLANEOUS) ×2 IMPLANT
BLADE SAGITTAL 13X1.27X60 (BLADE) ×2 IMPLANT
BLADE SAW SGTL 18X1.27X75 (BLADE) ×2 IMPLANT
BLADE SURG 15 STRL LF DISP TIS (BLADE) ×2 IMPLANT
BLADE SURG 15 STRL SS (BLADE) ×1
BLADE SURG SZ10 CARB STEEL (BLADE) ×4 IMPLANT
BNDG ELASTIC 6X5.8 VLCR STR LF (GAUZE/BANDAGES/DRESSINGS) ×2 IMPLANT
BOWL SMART MIX CTS (DISPOSABLE) ×2 IMPLANT
BSPLAT TIB 5D G CMNT STM LT (Knees) ×1 IMPLANT
CEMENT BONE R 1X40 (Cement) ×4 IMPLANT
COVER SURGICAL LIGHT HANDLE (MISCELLANEOUS) ×2 IMPLANT
CUFF TOURN SGL QUICK 34 (TOURNIQUET CUFF) ×1
CUFF TRNQT CYL 34X4.125X (TOURNIQUET CUFF) ×2 IMPLANT
DRAPE INCISE IOBAN 66X45 STRL (DRAPES) ×4 IMPLANT
DRAPE U-SHAPE 47X51 STRL (DRAPES) ×2 IMPLANT
DRSG AQUACEL AG ADV 3.5X10 (GAUZE/BANDAGES/DRESSINGS) ×2 IMPLANT
DURAPREP 26ML APPLICATOR (WOUND CARE) ×4 IMPLANT
ELECT BLADE TIP CTD 4 INCH (ELECTRODE) IMPLANT
ELECT REM PT RETURN 15FT ADLT (MISCELLANEOUS) ×2 IMPLANT
FEMUR  CMT CCR STD SZ10 L KNEE (Knees) ×1 IMPLANT
FEMUR CMT CCR STD SZ10 L KNEE (Knees) ×1 IMPLANT
FEMUR CMTD CR PERS STD SZ 5 RT (Knees) IMPLANT
GLOVE BIOGEL PI IND STRL 7.5 (GLOVE) ×2 IMPLANT
GLOVE BIOGEL PI IND STRL 8.5 (GLOVE) ×4 IMPLANT
GLOVE SURG LX STRL 7.5 STRW (GLOVE) ×2 IMPLANT
GLOVE SURG LX STRL 8.0 MICRO (GLOVE) ×4 IMPLANT
GLOVE SURG ORTHO 8.0 STRL STRW (GLOVE) ×4 IMPLANT
GOWN STRL REUS W/ TWL XL LVL3 (GOWN DISPOSABLE) ×4 IMPLANT
GOWN STRL REUS W/TWL XL LVL3 (GOWN DISPOSABLE) ×2
HANDPIECE INTERPULSE COAX TIP (DISPOSABLE) ×1
HOLDER FOLEY CATH W/STRAP (MISCELLANEOUS) ×2 IMPLANT
HOOD PEEL AWAY FLYTE STAYCOOL (MISCELLANEOUS) ×6 IMPLANT
KIT TURNOVER KIT A (KITS) IMPLANT
MANIFOLD NEPTUNE II (INSTRUMENTS) ×2 IMPLANT
MARKER SKIN DUAL TIP RULER LAB (MISCELLANEOUS) IMPLANT
NDL HYPO 21X1.5 SAFETY (NEEDLE) ×2 IMPLANT
NEEDLE HYPO 21X1.5 SAFETY (NEEDLE) ×1 IMPLANT
NS IRRIG 1000ML POUR BTL (IV SOLUTION) ×2 IMPLANT
PACK TOTAL KNEE CUSTOM (KITS) ×2 IMPLANT
PROTECTOR NERVE ULNAR (MISCELLANEOUS) ×2 IMPLANT
SET HNDPC FAN SPRY TIP SCT (DISPOSABLE) ×2 IMPLANT
SPIKE FLUID TRANSFER (MISCELLANEOUS) ×4 IMPLANT
STEM POLY PAT PLY 35M KNEE (Knees) IMPLANT
STEM TIBIA 5 DEG SZ G L KNEE (Knees) IMPLANT
STRIP CLOSURE SKIN 1/2X4 (GAUZE/BANDAGES/DRESSINGS) ×2 IMPLANT
SUT BONE WAX W31G (SUTURE) ×2 IMPLANT
SUT MNCRL AB 3-0 PS2 18 (SUTURE) ×2 IMPLANT
SUT STRATAFIX 0 PDS 27 VIOLET (SUTURE) ×1
SUT STRATAFIX 1PDS 45CM VIOLET (SUTURE) ×2 IMPLANT
SUT VIC AB 1 CT1 36 (SUTURE) ×2 IMPLANT
SUTURE STRATFX 0 PDS 27 VIOLET (SUTURE) ×2 IMPLANT
SYR 30ML LL (SYRINGE) ×4 IMPLANT
TAPE STRIPS DRAPE STRL (GAUZE/BANDAGES/DRESSINGS) IMPLANT
TIBIA STEM 5 DEG SZ G L KNEE (Knees) ×1 IMPLANT
TRAY FOLEY MTR SLVR 16FR STAT (SET/KITS/TRAYS/PACK) ×2 IMPLANT
WATER STERILE IRR 1000ML POUR (IV SOLUTION) ×4 IMPLANT
WRAP KNEE MAXI GEL POST OP (GAUZE/BANDAGES/DRESSINGS) ×2 IMPLANT

## 2022-02-02 NOTE — Anesthesia Procedure Notes (Signed)
Procedure Name: MAC Date/Time: 02/02/2022 8:10 AM  Performed by: West Pugh, CRNAPre-anesthesia Checklist: Patient identified, Emergency Drugs available, Suction available, Patient being monitored and Timeout performed Patient Re-evaluated:Patient Re-evaluated prior to induction Oxygen Delivery Method: Simple face mask Preoxygenation: Pre-oxygenation with 100% oxygen Placement Confirmation: positive ETCO2 Dental Injury: Teeth and Oropharynx as per pre-operative assessment

## 2022-02-02 NOTE — Anesthesia Procedure Notes (Signed)
Anesthesia Regional Block: Adductor canal block   Pre-Anesthetic Checklist: , timeout performed,  Correct Patient, Correct Site, Correct Laterality,  Correct Procedure, Correct Position, site marked,  Risks and benefits discussed,  Surgical consent,  Pre-op evaluation,  At surgeon's request and post-op pain management  Laterality: Left  Prep: chloraprep       Needles:  Injection technique: Single-shot  Needle Type: Echogenic Needle     Needle Length: 10cm  Needle Gauge: 21     Additional Needles:   Narrative:  Start time: 02/02/2022 7:39 AM End time: 02/02/2022 7:42 AM Injection made incrementally with aspirations every 5 mL.  Performed by: Personally  Anesthesiologist: Audry Pili, MD  Additional Notes: No pain on injection. No increased resistance to injection. Injection made in 5cc increments. Good needle visualization. Patient tolerated the procedure well.

## 2022-02-02 NOTE — H&P (Signed)
[name] MRN:  [id number] DOB/SEX:  [birth year]/male  CHIEF COMPLAINT:  Painful left Knee  HISTORY: Patient is a 66 y.o. male presented with a history of pain in the left knee. Onset of symptoms was gradual starting a few years ago with gradually worsening course since that time. Patient has been treated conservatively with over-the-counter NSAIDs and activity modification. Patient currently rates pain in the knee at 10 out of 10 with activity. There is pain at night.  PAST MEDICAL HISTORY: Patient Active Problem List   Diagnosis Date Noted   Encounter for general adult medical examination with abnormal findings 09/17/2021   Other hyperparathyroidism ([place]) 06/13/2021   Vitamin D deficiency 06/13/2021   Nonintractable episodic headache 04/23/2021   Primary osteoarthritis of both knees 04/23/2021   Anemia due to chronic blood loss 03/05/2021   Gastric ulcer 03/05/2021   Type 2 diabetes with nephropathy ([place])    LUQ pain    Prediabetes 02/22/2021   Left renal stone 09/26/2019   CAD in native artery    Ischemic cardiomyopathy 08/15/2019   Congestive heart failure ([place]) 03/21/2019   ST elevation myocardial infarction involving left anterior descending (LAD) coronary artery (HCC)    Chronic kidney disease, stage IV (severe) ([place]) 06/11/2016   Elevated PSA 06/11/2016   Hyperlipidemia 06/11/2016   Essential hypertension 05/28/2016   Past Medical History:  Diagnosis Date   Acute ST elevation myocardial infarction (STEMI) due to occlusion of mid portion of left anterior descending (LAD) coronary artery ([place]) 48/88/9169   Acute systolic heart failure (HCC)    AICD (automatic cardioverter/defibrillator) present    Arthritis    CHF (congestive heart failure) (HCC)    CKD (chronic kidney disease) stage 4, GFR 15-29 ml/min (HCC)    Diabetes mellitus, type II ([place])    Dyspnea    with exertionm periodically   Heart attack ([place]) 03/15/2019   anterior MI    History of kidney stones     Hyperparathyroidism ([place])    Hypertension 2002   Ischemic cardiomyopathy    Left knee pain    Syncope 04/04/2019   after blood draw   Vitamin D deficiency    Past Surgical History:  Procedure Laterality Date   AMPUTATION TOE Left 1991   2 digit of left foot   BIOPSY  02/26/2021   Procedure: BIOPSY;  Surgeon: Rogene Houston, MD;  [facility];  Service: Endoscopy;;   CHOLECYSTECTOMY     COLONOSCOPY     COLONOSCOPY WITH PROPOFOL N/A 02/26/2021   Procedure: COLONOSCOPY WITH PROPOFOL;  Surgeon: Rogene Houston, MD;  [facility];  Service: Endoscopy;  Laterality: N/A;   CORONARY/GRAFT ACUTE MI REVASCULARIZATION N/A 03/12/2019   Procedure: Coronary/Graft Acute MI Revascularization;  Surgeon: Belva Crome, MD;  Location: [facility];  Service: Cardiovascular;  Laterality: N/A;   ESOPHAGOGASTRODUODENOSCOPY (EGD) WITH PROPOFOL N/A 02/26/2021   Procedure: ESOPHAGOGASTRODUODENOSCOPY (EGD) WITH PROPOFOL;  Surgeon: Rogene Houston, MD;  [facility];  Service: Endoscopy;  Laterality: N/A;   ICD IMPLANT N/A 08/24/2019   Procedure: ICD IMPLANT;  Surgeon: Evans Lance, MD;  Location: [place] [facility];  Service: Cardiovascular;  Laterality: N/A;   IR URETERAL STENT LEFT NEW ACCESS W/O SEP NEPHROSTOMY CATH  09/26/2019   LEFT HEART CATH AND CORONARY ANGIOGRAPHY N/A 03/12/2019   Procedure: LEFT HEART CATH AND CORONARY ANGIOGRAPHY;  Surgeon: Belva Crome, MD;  Location: [place] [facility];  Service: Cardiovascular;  Laterality: N/A;  LITHOTRIPSY Right    NEPHROLITHOTOMY Left 09/26/2019   Procedure: LEFT NEPHROLITHOTOMY PERCUTANEOUS;  Surgeon: Irine Seal, MD;  [facility];  Service: Urology;  Laterality: Left;     MEDICATIONS:   Medications Prior to Admission  Medication Sig Dispense Refill Last Dose   allopurinol (ZYLOPRIM) 100 MG tablet Take 100 mg by mouth daily.    02/02/2022 at 0400   aspirin 81 MG chewable tablet Chew 1 tablet (81 mg total)  by mouth daily.   Past Week   atorvastatin (LIPITOR) 80 MG tablet TAKE 1 TABLET BY MOUTH DAILY AT 6PM 90 tablet 0 02/01/2022   BRILINTA 60 MG TABS tablet TAKE (1) TABLET BY MOUTH TWICE DAILY. 180 tablet 3 Past Week   carvedilol (COREG) 12.5 MG tablet Take 1 tablet (12.5 mg total) by mouth 2 (two) times daily with a meal. 180 tablet 3 02/02/2022 at 0400   Cholecalciferol (VITAMIN D) 50 MCG (2000 UT) CAPS Take 2,000 Units by mouth daily with breakfast.   Past Week   cinacalcet (SENSIPAR) 30 MG tablet Take 30 mg by mouth daily.   02/02/2022 at 0400   CORLANOR 5 MG TABS tablet TAKE 1 TABLET BY MOUTH TWICE DAILY WITH A MEAL 180 tablet 2 02/02/2022 at 0400   furosemide (LASIX) 40 MG tablet Take 40 mg by mouth daily as needed for fluid.   Past Week   nitroGLYCERIN (NITROSTAT) 0.4 MG SL tablet Place 1 tablet (0.4 mg total) under the tongue every 5 (five) minutes as needed. 25 tablet 2    pantoprazole (PROTONIX) 40 MG tablet Take 1 tablet (40 mg total) by mouth 2 (two) times daily before a meal. 60 tablet 11 02/02/2022 at 0400   sodium bicarbonate 650 MG tablet Take 650 mg by mouth 2 (two) times daily.   Past Week   amoxicillin-clavulanate (AUGMENTIN) 875-125 MG tablet Take 1 tablet by mouth every 12 (twelve) hours. (Patient not taking: Reported on 01/20/2022) 14 tablet 0 Not Taking   chlorhexidine (PERIDEX) 0.12 % solution Use as directed 15 mLs in the mouth or throat 2 (two) times daily. (Patient not taking: Reported on 01/20/2022) 120 mL 0 Not Taking   diclofenac Sodium (VOLTAREN) 1 % GEL Apply 2 g topically 4 (four) times daily. (Patient not taking: Reported on 01/20/2022) 50 g 0 Not Taking   lidocaine (XYLOCAINE) 2 % solution Use as directed 10 mLs in the mouth or throat every 3 (three) hours as needed for mouth pain. (Patient not taking: Reported on 01/20/2022) 100 mL 0 Not Taking   oxyCODONE-acetaminophen (PERCOCET) 10-325 MG tablet Take 1 tablet by mouth every 12 (twelve) hours as needed for pain. 60 tablet 0  Unknown    ALLERGIES:  No Known Allergies  REVIEW OF SYSTEMS:  A comprehensive review of systems was negative except for: Musculoskeletal: positive for arthralgias and bone pain   FAMILY HISTORY:   Family History  Problem Relation Age of Onset   Heart failure Mother    Heart disease Mother    Cancer Mother        breast   Heart attack Father    Hypertension Father    Heart disease Father    Diabetes Brother     SOCIAL HISTORY:   Social History   Tobacco Use   Smoking status: Former    Packs/day: 0.50    Years: 40.00    Total pack years: 20.00    Types: Cigarettes    Quit date: 03/12/2019    Years since  quitting: 2.8   Smokeless tobacco: Never  Substance Use Topics   Alcohol use: No     EXAMINATION:  Vital signs in last 24 hours: Temp:  [98.7 F (37.1 C)] 98.7 F (37.1 C) (10/02 0542) Pulse Rate:  [70] 70 (10/02 0542) Resp:  [18] 18 (10/02 0542) BP: (153)/(103) 153/103 (10/02 0542) SpO2:  [95 %] 95 % (10/02 0542) Weight:  [95.7 kg] 95.7 kg (10/02 0534)  BP (!) 153/103   Pulse 70   Temp 98.7 F (37.1 C) (Oral)   Resp 18   Ht 6' (1.829 m)   Wt 95.7 kg   SpO2 95%   BMI 28.62 kg/m   General Appearance:    Alert, cooperative, no distress, appears stated age  Head:    Normocephalic, without obvious abnormality, atraumatic  Eyes:    PERRL, conjunctiva/corneas clear, EOM's intact, fundi    benign, both eyes       Ears:    Normal TM's and external ear canals, both ears  Nose:   Nares normal, septum midline, mucosa normal, no drainage    or sinus tenderness  Throat:   Lips, mucosa, and tongue normal; teeth and gums normal  Neck:   Supple, symmetrical, trachea midline, no adenopathy;       thyroid:  No enlargement/tenderness/nodules; no carotid   bruit or JVD  Back:     Symmetric, no curvature, ROM normal, no CVA tenderness  Lungs:     Clear to auscultation bilaterally, respirations unlabored  Chest wall:    No tenderness or deformity  Heart:    Regular  rate and rhythm, S1 and S2 normal, no murmur, rub   or gallop  Abdomen:     Soft, non-tender, bowel sounds active all four quadrants,    no masses, no organomegaly  Genitalia:    Normal male without lesion, discharge or tenderness  Rectal:    Normal tone, normal prostate, no masses or tenderness;   guaiac negative stool  Extremities:   Extremities normal, atraumatic, no cyanosis or edema  Pulses:   2+ and symmetric all extremities  Skin:   Skin color, texture, turgor normal, no rashes or lesions  Lymph nodes:   Cervical, supraclavicular, and axillary nodes normal  Neurologic:   CNII-XII intact. Normal strength, sensation and reflexes      throughout    Musculoskeletal:  ROM 0-120, Ligaments intact,  Imaging Review Plain radiographs demonstrate severe degenerative joint disease of the left knee. The overall alignment is neutral. The bone quality appears to be good for age and reported activity level.  Assessment/Plan: Primary osteoarthritis, left knee   The patient history, physical examination and imaging studies are consistent with advanced degenerative joint disease of the left knee. The patient has failed conservative treatment.  The clearance notes were reviewed.  After discussion with the patient it was felt that Total Knee Replacement was indicated. The procedure,  risks, and benefits of total knee arthroplasty were presented and reviewed. The risks including but not limited to aseptic loosening, infection, blood clots, vascular injury, stiffness, patella tracking problems complications among others were discussed. The patient acknowledged the explanation, agreed to proceed with the plan.  Preoperative templating of the joint replacement has been completed, documented, and submitted to the Operating Room personnel in order to optimize intra-operative equipment management.    Patient's anticipated LOS is less than 2 midnights, meeting these requirements: - Younger than 71 - Lives  within 1 hour of care - Has a competent adult at  home to recover with post-op recover - NO history of  - Chronic pain requiring opiods  - Diabetes  - Coronary Artery Disease  - Heart failure  - Heart attack  - Stroke  - DVT/VTE  - Cardiac arrhythmia  - Respiratory Failure/COPD  - Renal failure  - Anemia  - Advanced Liver disease     Donia Ast 02/02/2022, 6:57 AM

## 2022-02-02 NOTE — Anesthesia Procedure Notes (Signed)
Spinal  Patient location during procedure: OR Start time: 02/02/2022 8:12 AM End time: 02/02/2022 8:15 AM Reason for block: surgical anesthesia Staffing Performed: anesthesiologist  Anesthesiologist: Audry Pili, MD Performed by: Audry Pili, MD Authorized by: Audry Pili, MD   Preanesthetic Checklist Completed: patient identified, IV checked, risks and benefits discussed, surgical consent, monitors and equipment checked, pre-op evaluation and timeout performed Spinal Block Patient position: sitting Prep: DuraPrep Patient monitoring: heart rate, cardiac monitor, continuous pulse ox and blood pressure Approach: midline Location: L3-4 Injection technique: single-shot Needle Needle type: Pencan  Needle gauge: 24 G Additional Notes Consent was obtained prior to the procedure with all questions answered and concerns addressed. Risks including, but not limited to, bleeding, infection, nerve damage, paralysis, failed block, inadequate analgesia, allergic reaction, high spinal, itching, and headache were discussed and the patient wished to proceed. Functioning IV was confirmed and monitors were applied. Sterile prep and drape, including hand hygiene, mask, and sterile gloves were used. The patient was positioned and the spine was prepped. The skin was anesthetized with lidocaine. Free flow of clear CSF was obtained prior to injecting local anesthetic into the CSF. The spinal needle aspirated freely following injection. The needle was carefully withdrawn. The patient tolerated the procedure well.   Renold Don, MD

## 2022-02-02 NOTE — Progress Notes (Signed)
Orthopedic Tech Progress Note Patient Details:  [name] 1955-09-22 [id number]  CPM Left Knee CPM Left Knee: On Left Knee Flexion (Degrees): 90 Left Knee Extension (Degrees): 0  Post Interventions Patient Tolerated: Well     Post Interventions Patient Tolerated: Well  Linus Salmons Lakira Ogando 02/02/2022, 2:19 PM

## 2022-02-02 NOTE — Transfer of Care (Signed)
Immediate Anesthesia Transfer of Care Note  Patient: [name]  Procedure(s) Performed: TOTAL KNEE ARTHROPLASTY (Left: Knee)  Patient Location: PACU  Anesthesia Type:Spinal and MAC combined with regional for post-op pain  Level of Consciousness: drowsy and patient cooperative  Airway & Oxygen Therapy: Patient Spontanous Breathing and Patient connected to face mask oxygen  Post-op Assessment: Report given to RN and Post -op Vital signs reviewed and stable  Post vital signs: Reviewed and stable  Last Vitals:  Vitals Value Taken Time  BP 121/89 02/02/22 1015  Temp    Pulse 64 02/02/22 1019  Resp 14 02/02/22 1019  SpO2 98 % 02/02/22 1019  Vitals shown include unvalidated device data.  Last Pain:  Vitals:   02/02/22 0542  TempSrc: Oral         Complications: No notable events documented.

## 2022-02-02 NOTE — Anesthesia Postprocedure Evaluation (Signed)
Anesthesia Post Note  Patient: [name]  Procedure(s) Performed: TOTAL KNEE ARTHROPLASTY (Left: Knee)     Patient location during evaluation: PACU Anesthesia Type: Spinal Level of consciousness: awake and alert Pain management: pain level controlled Vital Signs Assessment: post-procedure vital signs reviewed and stable Respiratory status: spontaneous breathing and respiratory function stable Cardiovascular status: blood pressure returned to baseline and stable Postop Assessment: spinal receding and no apparent nausea or vomiting Anesthetic complications: no   No notable events documented.  Last Vitals:  Vitals:   02/02/22 1215 02/02/22 1244  BP: (!) 155/108 (!) 140/105  Pulse: 61 66  Resp: 16 17  Temp:  (!) 36.3 C  SpO2: 96% 97%    Last Pain:  Vitals:   02/02/22 1244  TempSrc: Axillary  PainSc: 0-No pain    LLE Motor Response: Purposeful movement (02/02/22 1244) LLE Sensation: Decreased (02/02/22 1244) RLE Motor Response: Purposeful movement (02/02/22 1244) RLE Sensation: Decreased (02/02/22 1244) L Sensory Level: S1-Sole of foot, small toes (02/02/22 1244) R Sensory Level: S1-Sole of foot, small toes (02/02/22 1244)  Audry Pili

## 2022-02-02 NOTE — Op Note (Signed)
TOTAL KNEE REPLACEMENT OPERATIVE NOTE:  02/02/2022  12:20 PM  PATIENT:  [name]  66 y.o. male  PRE-OPERATIVE DIAGNOSIS:  Osteoarthritis of left knee M17.12  POST-OPERATIVE DIAGNOSIS:  Osteoarthritis of left knee M17.12  PROCEDURE:  Procedure(s): TOTAL KNEE ARTHROPLASTY  SURGEON:  Surgeon(s): Vickey Huger, MD  PHYSICIAN ASSISTANT: Nehemiah Massed, PA-C  ANESTHESIA:   spinal  SPECIMEN: None  COUNTS:  Correct  TOURNIQUET:   Total Tourniquet Time Documented: Thigh (Left) - 45 minutes Total: Thigh (Left) - 45 minutes   DICTATION:  Indication for procedure:    The patient is a 66 y.o. male who has failed conservative treatment for Osteoarthritis of left knee M17.12.  Informed consent was obtained prior to anesthesia. The risks versus benefits of the operation were explain and in a way the patient can, and did, understand.     Description of procedure:     The patient was taken to the operating room and placed under anesthesia.  The patient was positioned in the usual fashion taking care that all body parts were adequately padded and/or protected.  A tourniquet was applied and the leg prepped and draped in the usual sterile fashion.  The extremity was exsanguinated with the esmarch and tourniquet inflated to 300 mmHg.  Pre-operative range of motion was normal.   A midline incision approximately 6-7 inches long was made with a #10 blade.  A new blade was used to make a parapatellar arthrotomy going 2-3 cm into the quadriceps tendon, over the patella, and alongside the medial aspect of the patellar tendon.  A synovectomy was then performed with the #10 blade and forceps. I then elevated the deep MCL off the medial tibial metaphysis subperiosteally around to the semimembranosus attachment.    I everted the patella and used calipers to measure patellar thickness.  I used the reamer to ream down to appropriate thickness to recreate the native thickness.  I then removed excess  bone with the rongeur and sagittal saw.  I used the appropriately sized template and drilled the three lug holes.  I then put the trial in place and measured the thickness with the calipers to ensure recreation of the native thickness.  The trial was then removed and the patella subluxed and the knee brought into flexion.  A homan retractor was place to retract and protect the patella and lateral structures.  A Z-retractor was place medially to protect the medial structures.  The extra-medullary alignment system was used to make cut the tibial articular surface perpendicular to the anamotic axis of the tibia and in 3 degrees of posterior slope.  The cut surface and alignment jig was removed.   I then marked out the epicondylar axis on the distal femur.   I then used the anterior referencing sizer and measured the femur to be a size 10.  The 4-In-1 cutting block was screwed into place in external rotation matching the posterior condylar angle, making our cuts perpendicular to the epicondylar axis.  Anterior, posterior and chamfer cuts were made with the sagittal saw.  The cutting block and cut pieces were removed.  A lamina spreader was placed in 90 degrees of flexion.  The ACL, PCL, menisci, and posterior condylar osteophytes were removed.  A 10 mm spacer blocked was found to offer good flexion and extension gap balance after minimal in degree releasing.   The scoop retractor was then placed and the femoral finishing block was pinned in place.  The small sagittal saw was used as  well as the lug drill to finish the femur.  The block and cut surfaces were removed and the medullary canal hole filled with autograft bone from the cut pieces.  The tibia was delivered forward in deep flexion and external rotation.  A size G tray was selected and pinned into place centered on the medial 1/3 of the tibial tubercle.  The reamer and keel was used to prepare the tibia through the tray.    I then trialed with the size  10 femur, size G tibia, a 10 mm insert and the 35 patella.  I had excellent flexion/extension gap balance, excellent patella tracking.  Flexion was full and beyond 120 degrees; extension was zero.  These components were chosen and the staff opened them to me on the back table while the knee was lavaged copiously and the cement mixed.  The soft tissue was infiltrated with 60cc of exparel 1.3% through a 21 gauge needle.  I cemented in the components and removed all excess cement.  The polyethylene tibial component was snapped into place and the knee placed in extension while cement was hardening.  The capsule was infilltrated with a 60cc exparel/marcaine/saline mixture.   Once the cement was hard, the tourniquet was let down.  Hemostasis was obtained.  The arthrotomy was closed using a #1 stratofix running suture.  The deep soft tissues were closed with #0 vicryls and the subcuticular layer closed with #2-0 vicryl.  The skin was reapproximated and closed with 3.0 Monocryl.  The wound was covered with steristrips, aquacel dressing, and a TED stocking.   The patient was then awakened, extubated, and taken to the recovery room in stable condition.  BLOOD LOSS:  [id number] COMPLICATIONS:  None.  PLAN OF CARE: Admit for overnight observation  PATIENT DISPOSITION:  PACU - hemodynamically stable.     Please fax a copy of this op note to my office at [phone] (please only include page 1 and 2 of the Case Information op note)

## 2022-02-02 NOTE — Plan of Care (Signed)

## 2022-02-02 NOTE — Evaluation (Signed)
Physical Therapy Evaluation Patient Details Name: [name] MRN: [id number] DOB: December 18, 1955 Today's Date: 02/02/2022  History of Present Illness  Pt is a 66yo male presenting s/p L-TKA on 02/02/22. PMH: hx of STEMI, AICD, CHF, CKD4, DM w/ peripheral neuropathy, hyperparathyroidism, HTN.   Clinical Impression  [name] is a 66 y.o. male POD 0 s/p L-TKA. Patient reports modified independence using SPC with mobility at baseline. Patient is now limited by functional impairments (see PT problem list below) and requires mod assist for transfer; pt unable to safely stand at present, further mobility deferred. Patient instructed in exercise to facilitate ROM and circulation to manage edema. Provided incentive spirometer and with Vcs pt able to achieve 255mL. Patient will benefit from continued skilled PT interventions to address impairments and progress towards PLOF. Acute PT will follow to progress mobility and HEP in preparation for safe discharge home.       Recommendations for follow up therapy are one component of a multi-disciplinary discharge planning process, led by the attending physician.  Recommendations may be updated based on patient status, additional functional criteria and insurance authorization.  Follow Up Recommendations Follow physician's recommendations for discharge plan and follow up therapies      Assistance Recommended at Discharge Intermittent Supervision/Assistance  Patient can return home with the following  A little help with walking and/or transfers;A little help with bathing/dressing/bathroom;Assistance with cooking/housework;Assist for transportation;Help with stairs or ramp for entrance    Equipment Recommendations Rolling walker (2 wheels);BSC/3in1  Recommendations for Other Services       Functional Status Assessment Patient has had a recent decline in their functional status and demonstrates the ability to make significant improvements in function  in a reasonable and predictable amount of time.     Precautions / Restrictions Precautions Precautions: Fall;Knee Precaution Booklet Issued: No Precaution Comments: no pillow under the knee Restrictions Weight Bearing Restrictions: Yes LLE Weight Bearing: Weight bearing as tolerated      Mobility  Bed Mobility Overal bed mobility: Needs Assistance Bed Mobility: Supine to Sit, Sit to Supine     Supine to sit: Min assist Sit to supine: Mod assist, +2 for safety/equipment   General bed mobility comments: Supine to sit: min assist for bed mobility to bring BLE off bed. Sit to supine: mod assist +2 to lower trunk and bring BLE into bed.    Transfers Overall transfer level: Needs assistance Equipment used: Rolling walker (2 wheels) Transfers: Sit to/from Stand Sit to Stand: Mod assist, +2 safety/equipment, +2 physical assistance, From elevated surface           General transfer comment: Pt required mod assist +2 for sit to stand transfer, pt's L knee buckling and pt reporting decreased sensation from knee down to foot. Pt did not have buckling of R knee but RLE did not perform much better. Attempted to block knees but pt unable to fully control BLE at hips as well. Transfer attempted x2. Further mobility unsafe at present, returned to supine.    Ambulation/Gait               General Gait Details: unsafe at present  Stairs            Wheelchair Mobility    Modified Rankin (Stroke Patients Only)       Balance Overall balance assessment: Needs assistance Sitting-balance support: Feet supported, No upper extremity supported Sitting balance-Leahy Scale: Good     Standing balance support: During functional activity, Reliant on assistive device for balance,  Bilateral upper extremity supported Standing balance-Leahy Scale: Poor                               Pertinent Vitals/Pain Pain Assessment Pain Assessment: 0-10 Pain Score: 7  Pain  Location: left knee Pain Descriptors / Indicators: Operative site guarding Pain Intervention(s): Limited activity within patient's tolerance, Monitored during session, Repositioned, Ice applied    Home Living Family/patient expects to be discharged to:: Private residence Living Arrangements: Parent (Cares for mom Rod Holler) Available Help at Discharge: Family;Available 24 hours/day Joycelyn Schmid is helping) Type of Home: House Home Access: Ramped entrance       Home Layout: One level Home Equipment: [place] - single point      Prior Function Prior Level of Function : Independent/Modified Independent;Driving             Mobility Comments: SPC ADLs Comments: ind     Hand Dominance        Extremity/Trunk Assessment   Upper Extremity Assessment Upper Extremity Assessment: Overall WFL for tasks assessed    Lower Extremity Assessment Lower Extremity Assessment: RLE deficits/detail;LLE deficits/detail RLE Deficits / Details: MMT ank DF 3/5, pf 4/5, pt reporting LLE is feeling "more numb" than R RLE Sensation: WNL LLE Deficits / Details: MMT ank DF 3/5, pf 4/5, pt unable to lift leg off bed for SLR LLE Sensation: WNL    Cervical / Trunk Assessment Cervical / Trunk Assessment: Normal  Communication   Communication: No difficulties  Cognition Arousal/Alertness: Awake/alert Behavior During Therapy: WFL for tasks assessed/performed Overall Cognitive Status: Within Functional Limits for tasks assessed                                          General Comments      Exercises Total Joint Exercises Ankle Circles/Pumps: AROM, Both, 10 reps   Assessment/Plan    PT Assessment Patient needs continued PT services  PT Problem List Decreased strength;Decreased range of motion;Decreased activity tolerance;Decreased balance;Decreased mobility;Decreased coordination;Pain       PT Treatment Interventions DME instruction;Gait training;Stair training;Functional mobility  training;Therapeutic activities;Therapeutic exercise;Balance training;Neuromuscular re-education;Patient/family education    PT Goals (Current goals can be found in the Care Plan section)  Acute Rehab PT Goals Patient Stated Goal: To walk without pain PT Goal Formulation: With patient Time For Goal Achievement: 02/09/22 Potential to Achieve Goals: Good    Frequency 7X/week     Co-evaluation               AM-PAC PT "6 Clicks" Mobility  Outcome Measure Help needed turning from your back to your side while in a flat bed without using bedrails?: None Help needed moving from lying on your back to sitting on the side of a flat bed without using bedrails?: A Little Help needed moving to and from a bed to a chair (including a wheelchair)?: A Lot Help needed standing up from a chair using your arms (e.g., wheelchair or bedside chair)?: A Little Help needed to walk in hospital room?: A Lot Help needed climbing 3-5 steps with a railing? : A Lot 6 Click Score: 16    End of Session Equipment Utilized During Treatment: Gait belt Activity Tolerance: Patient tolerated treatment well;No increased pain Patient left: in bed;with call bell/phone within reach;with bed alarm set;with SCD's reapplied Nurse Communication: Mobility status PT Visit Diagnosis:  Pain;Difficulty in walking, not elsewhere classified (R26.2) Pain - Right/Left: Left Pain - part of body: Knee    Time: 3073-5430 PT Time Calculation (min) (ACUTE ONLY): 20 min   Charges:   PT Evaluation $PT Eval Low Complexity: 1 Low          Coolidge Breeze, PT, DPT [facility] Office: [phone] Weekend pager: [phone]  Coolidge Breeze 02/02/2022, 4:15 PM

## 2022-02-03 ENCOUNTER — Encounter (HOSPITAL_COMMUNITY): Payer: Self-pay | Admitting: Orthopedic Surgery

## 2022-02-03 DIAGNOSIS — Z87891 Personal history of nicotine dependence: Secondary | ICD-10-CM | POA: Diagnosis not present

## 2022-02-03 DIAGNOSIS — M1712 Unilateral primary osteoarthritis, left knee: Secondary | ICD-10-CM | POA: Diagnosis not present

## 2022-02-03 DIAGNOSIS — E559 Vitamin D deficiency, unspecified: Secondary | ICD-10-CM | POA: Diagnosis present

## 2022-02-03 DIAGNOSIS — I5022 Chronic systolic (congestive) heart failure: Secondary | ICD-10-CM | POA: Diagnosis present

## 2022-02-03 DIAGNOSIS — Z95810 Presence of automatic (implantable) cardiac defibrillator: Secondary | ICD-10-CM | POA: Diagnosis not present

## 2022-02-03 DIAGNOSIS — Z79899 Other long term (current) drug therapy: Secondary | ICD-10-CM | POA: Diagnosis not present

## 2022-02-03 DIAGNOSIS — I255 Ischemic cardiomyopathy: Secondary | ICD-10-CM | POA: Diagnosis present

## 2022-02-03 DIAGNOSIS — I2510 Atherosclerotic heart disease of native coronary artery without angina pectoris: Secondary | ICD-10-CM | POA: Diagnosis present

## 2022-02-03 DIAGNOSIS — Z8249 Family history of ischemic heart disease and other diseases of the circulatory system: Secondary | ICD-10-CM | POA: Diagnosis not present

## 2022-02-03 DIAGNOSIS — E1121 Type 2 diabetes mellitus with diabetic nephropathy: Secondary | ICD-10-CM | POA: Diagnosis present

## 2022-02-03 DIAGNOSIS — Z7902 Long term (current) use of antithrombotics/antiplatelets: Secondary | ICD-10-CM | POA: Diagnosis not present

## 2022-02-03 DIAGNOSIS — E785 Hyperlipidemia, unspecified: Secondary | ICD-10-CM | POA: Diagnosis present

## 2022-02-03 DIAGNOSIS — Z7982 Long term (current) use of aspirin: Secondary | ICD-10-CM | POA: Diagnosis not present

## 2022-02-03 DIAGNOSIS — E1122 Type 2 diabetes mellitus with diabetic chronic kidney disease: Secondary | ICD-10-CM | POA: Diagnosis present

## 2022-02-03 DIAGNOSIS — N184 Chronic kidney disease, stage 4 (severe): Secondary | ICD-10-CM | POA: Diagnosis present

## 2022-02-03 DIAGNOSIS — I252 Old myocardial infarction: Secondary | ICD-10-CM | POA: Diagnosis not present

## 2022-02-03 DIAGNOSIS — Z833 Family history of diabetes mellitus: Secondary | ICD-10-CM | POA: Diagnosis not present

## 2022-02-03 DIAGNOSIS — I130 Hypertensive heart and chronic kidney disease with heart failure and stage 1 through stage 4 chronic kidney disease, or unspecified chronic kidney disease: Secondary | ICD-10-CM | POA: Diagnosis present

## 2022-02-03 DIAGNOSIS — Z803 Family history of malignant neoplasm of breast: Secondary | ICD-10-CM | POA: Diagnosis not present

## 2022-02-03 DIAGNOSIS — Z96652 Presence of left artificial knee joint: Secondary | ICD-10-CM | POA: Diagnosis not present

## 2022-02-03 MED ORDER — OXYCODONE HCL 5 MG PO TABS
5.0000 mg | ORAL_TABLET | ORAL | Status: DC | PRN
  Administered 2022-02-03 – 2022-02-05 (×7): 15 mg via ORAL
  Administered 2022-02-06: 10 mg via ORAL
  Filled 2022-02-03 (×3): qty 3
  Filled 2022-02-03: qty 2
  Filled 2022-02-03 (×4): qty 3

## 2022-02-03 NOTE — Progress Notes (Signed)
SPORTS MEDICINE AND JOINT REPLACEMENT  Lara Mulch, MD    Carlyon Shadow, PA-C [place], [place], [place]  [id number]                             [phone]   PROGRESS NOTE  Subjective:  negative for Chest Pain  negative for Shortness of Breath  negative for Nausea/Vomiting   negative for Calf Pain  negative for Bowel Movement   Tolerating Diet: yes         Patient reports pain as 6 on 0-10 scale.    Objective: Vital signs in last 24 hours:   Patient Vitals for the past 24 hrs:  BP Temp Temp src Pulse Resp SpO2  02/03/22 0958 (!) 141/106 98.1 F (36.7 C) -- 85 16 100 %  02/03/22 0335 (!) 121/97 98.3 F (36.8 C) Oral 99 17 93 %  02/02/22 2343 (!) 160/113 98.3 F (36.8 C) Oral 84 18 94 %  02/02/22 1850 (!) 156/109 -- -- 80 -- --  02/02/22 1838 (!) 164/119 -- -- 80 17 96 %  02/02/22 1549 (!) 136/110 97.9 F (36.6 C) Oral 74 14 99 %    @flow {1959:LAST@   Intake/Output from previous day:   10/02 0701 - 10/03 0700 In: 2469.5 [P.O.:330; I.V.:1939.5] Out: 1200 [Urine:1150]   Intake/Output this shift:   No intake/output data recorded.   Intake/Output      10/02 0701 10/03 0700 10/03 0701 10/04 0700   P.O. 330    I.V. (mL/kg) 1939.5 (20.3)    IV Piggyback 200    Total Intake(mL/kg) 2469.5 (25.8)    Urine (mL/kg/hr) 1150 (0.5)    Blood 50    Total Output 1200    Net +1269.5            LABORATORY DATA: Recent Labs    02/02/22 0635  WBC 4.7  HGB 11.6*  HCT 35.1*  PLT 163   Recent Labs    02/02/22 0635  NA 140  K 4.3  CL 113*  CO2 22  BUN 29*  CREATININE 2.86*  GLUCOSE 105*  CALCIUM 10.5*   Lab Results  Component Value Date   INR 1.0 09/26/2019   INR 1.0 03/15/2008    Examination:  General appearance: alert, cooperative, and no distress Extremities: extremities normal, atraumatic, no cyanosis or edema  Wound Exam: clean, dry, intact   Drainage:  None: wound tissue dry  Motor Exam: Quadriceps and Hamstrings Intact  Sensory  Exam: Superficial Peroneal, Deep Peroneal, and Tibial normal   Assessment:    1 Day Post-Op  Procedure(s) (LRB): TOTAL KNEE ARTHROPLASTY (Left)  ADDITIONAL DIAGNOSIS:  Principal Problem:   S/P total knee replacement     Plan: Physical Therapy as ordered Weight Bearing as Tolerated (WBAT)  DVT Prophylaxis:  Aspirin  DISCHARGE PLAN: Home  Patient still struggling with pain control and lack of progress, will stay 1 more night       Patient's anticipated LOS is less than 2 midnights, meeting these requirements: - Younger than 65 - Lives within 1 hour of care - Has a competent adult at home to recover with post-op recover - NO history of  - Chronic pain requiring opiods  - Diabetes  - Coronary Artery Disease  - Heart failure  - Heart attack  - Stroke  - DVT/VTE  - Cardiac arrhythmia  - Respiratory Failure/COPD  - Renal failure  - Anemia  - Advanced  Liver disease      Donia Ast 02/03/2022, 12:53 PM

## 2022-02-03 NOTE — Progress Notes (Signed)
PT Cancellation Note  Patient Details Name: [name] MRN: [id number] DOB: [birth year]   Cancelled Treatment:    Reason Eval/Treat Not Completed: (P) Pain limiting ability to participate, pt reporting 7/10 pain, grimacing, requesting to reposition in bed. PT adjusted pt's supine positioning and propped pt with pillows, ensuring L knee is straight. Reinforced importance of early mobilization, pt verbalized understanding. Will follow up as schedule and pt status allows and will attempt two sessions today for possible discharge home.    Coolidge Breeze, PT, DPT [place] Office: [phone] Weekend pager: [phone] Coolidge Breeze 02/03/2022, 10:28 AM

## 2022-02-03 NOTE — TOC Transition Note (Deleted)
Transition of Care [facility]) - CM/SW Discharge Note   Patient Details  Name: [name] MRN: [id number] Date of Birth: [birth year]  [facility] CM/SW Contact:  Lennart Pall, LCSW Phone Number: 02/03/2022, 11:03 AM   Clinical Narrative:    Met with pt and confirming he has all needed DME at home.  OPPT already arranged by Ortho office.  No TOC needs.   Final next level of care: OP Rehab Barriers to Discharge: No Barriers Identified   Patient Goals and CMS Choice        Discharge Placement                       Discharge Plan and Services                DME Arranged: N/A DME Agency: NA                  Social Determinants of Health (SDOH) Interventions     Readmission Risk Interventions     No data to display

## 2022-02-03 NOTE — Progress Notes (Signed)
PT Cancellation Note  Patient Details Name: [name] MRN: [id number] DOB: [birth year]   Cancelled Treatment:    Reason Eval/Treat Not Completed: (P) Pain limiting ability to participate, pt reporting high degree of pain, nausea, and fatigue, requesting to defer until tomorrow. Emphasized the importance of regular ambulation and exercises and pt remained adamant. Repositioned pt for comfort ensuring L knee is straight. We will follow up as schedule and pt status allows.  Coolidge Breeze, PT, DPT [place] Office: [phone] Weekend pager: [phone]  Coolidge Breeze 02/03/2022, 5:35 PM

## 2022-02-03 NOTE — Progress Notes (Signed)
Physical Therapy Treatment Patient Details Name: [name] MRN: [id number] DOB: 05-13-1955 Today's Date: 02/03/2022   History of Present Illness Pt is a 66yo male presenting s/p L-TKA on 02/02/22. PMH: hx of STEMI, AICD, CHF, CKD4, DM w/ peripheral neuropathy, hyperparathyroidism, HTN.    PT Comments    Pt seen POD1 supine in bed, complaining of pain and fatigue and reporting "I didn't sleep well last night." BP in supine prior to mobility 133/92. Pt required min assist for bed mobility and transfers, min guard for short-distance ambulation with RW distance limited by fatigue, pain, pt reporting dizziness and nausea; further mobility deferred. BP in recliner 157/107, RN notified. Pt able to answer questions appropriately though with delayed responses. While repositioning pt for comfort, pt fell asleep. Will attempt second session to review HEP with expectation that pt will meet acute mobility goals today; though pt is expressing desire to remain an additional night secondary to pain. We will continue to follow acutely.   Recommendations for follow up therapy are one component of a multi-disciplinary discharge planning process, led by the attending physician.  Recommendations may be updated based on patient status, additional functional criteria and insurance authorization.  Follow Up Recommendations  Follow physician's recommendations for discharge plan and follow up therapies     Assistance Recommended at Discharge Intermittent Supervision/Assistance  Patient can return home with the following A little help with walking and/or transfers;A little help with bathing/dressing/bathroom;Assistance with cooking/housework;Assist for transportation;Help with stairs or ramp for entrance   Equipment Recommendations  Rolling walker (2 wheels);BSC/3in1    Recommendations for Other Services       Precautions / Restrictions Precautions Precautions: Fall;Knee Precaution Booklet Issued:  No Precaution Comments: no pillow under the knee Restrictions Weight Bearing Restrictions: No LLE Weight Bearing: Weight bearing as tolerated     Mobility  Bed Mobility Overal bed mobility: Needs Assistance Bed Mobility: Supine to Sit     Supine to sit: Min assist     General bed mobility comments: Supine to sit: min assist for bed mobility to bring RLE off bed.    Transfers Overall transfer level: Needs assistance Equipment used: Rolling walker (2 wheels) Transfers: Sit to/from Stand Sit to Stand: Min assist           General transfer comment: Pt required min assist for sit to stand transfer for steadying only, no lift assist required. Pt required two attempts secondary to pain.    Ambulation/Gait Ambulation/Gait assistance: Min guard Gait Distance (Feet): 18 Feet Assistive device: Rolling walker (2 wheels) Gait Pattern/deviations: Step-to pattern Gait velocity: decreased     General Gait Details: Pt ambulated with RW and min guard, no physical assist required or overt LOB noted. At end of ambulation task pt reported dizziness and nausea, further mobility deferred, RN notified.   Stairs             Wheelchair Mobility    Modified Rankin (Stroke Patients Only)       Balance Overall balance assessment: Needs assistance Sitting-balance support: Feet supported, No upper extremity supported Sitting balance-Leahy Scale: Good     Standing balance support: During functional activity, Reliant on assistive device for balance, Bilateral upper extremity supported Standing balance-Leahy Scale: Poor                              Cognition Arousal/Alertness: Awake/alert Behavior During Therapy: WFL for tasks assessed/performed Overall Cognitive Status: Within Functional Limits for tasks  assessed                                          Exercises Total Joint Exercises Ankle Circles/Pumps: AROM, Both, 10 reps    General  Comments        Pertinent Vitals/Pain Pain Assessment Pain Assessment: 0-10 Pain Score: 8  Pain Location: left knee Pain Descriptors / Indicators: Operative site guarding Pain Intervention(s): Limited activity within patient's tolerance, Monitored during session, Repositioned, Ice applied    Home Living                          Prior Function            PT Goals (current goals can now be found in the care plan section) Acute Rehab PT Goals Patient Stated Goal: To walk without pain PT Goal Formulation: With patient Time For Goal Achievement: 02/09/22 Potential to Achieve Goals: Good Progress towards PT goals: Progressing toward goals    Frequency    7X/week      PT Plan Current plan remains appropriate    Co-evaluation              AM-PAC PT "6 Clicks" Mobility   Outcome Measure  Help needed turning from your back to your side while in a flat bed without using bedrails?: A Little Help needed moving from lying on your back to sitting on the side of a flat bed without using bedrails?: A Little Help needed moving to and from a bed to a chair (including a wheelchair)?: A Little Help needed standing up from a chair using your arms (e.g., wheelchair or bedside chair)?: A Little Help needed to walk in hospital room?: A Lot Help needed climbing 3-5 steps with a railing? : A Lot 6 Click Score: 16    End of Session Equipment Utilized During Treatment: Gait belt Activity Tolerance: Patient limited by fatigue Patient left: with call bell/phone within reach;in chair;with chair alarm set Nurse Communication: Mobility status (Dizzy, nauseated, BP) PT Visit Diagnosis: Pain;Difficulty in walking, not elsewhere classified (R26.2) Pain - Right/Left: Left Pain - part of body: Knee     Time: 0160-1093 PT Time Calculation (min) (ACUTE ONLY): 27 min  Charges:  $Gait Training: 23-37 mins                     Coolidge Breeze, PT, DPT [facility]  Department Office: [phone] Weekend pager: [phone]   Coolidge Breeze 02/03/2022, 12:52 PM

## 2022-02-04 NOTE — Progress Notes (Signed)
Orthopedic Tech Progress Note Patient Details:  [name] 1955/12/03 [id number]  Patient ID: [name], male   DOB: [birth year], 66 y.o.   MRN: [id number]  [name] 02/04/2022, 3:28 PM Patient turned off cpm and wanted it removed

## 2022-02-04 NOTE — Progress Notes (Signed)
Physical Therapy Treatment Patient Details Name: [name] MRN: [id number] DOB: 09-Sep-1955 Today's Date: 02/04/2022   History of Present Illness Pt is a 66yo male presenting s/p L-TKA on 02/02/22. PMH: hx of STEMI, AICD, CHF, CKD4, DM w/ peripheral neuropathy, hyperparathyroidism, HTN.    PT Comments    POD # 2 pm session Pt still groggy/drunk.  Assisted with using urinal while seated in recliner.  Assisted with amb a limited distance in hallway.  General Gait Details: pt required increased assit due to decreased level of alertness.  VERY unsteady gait.  HIGH FALL RISK.  Still drunken gait and asking for more pain medication. Asissted back to bed.  General bed mobility comments: Mod assist to support l LE back to bed, positioned to comfort and applied CPM 0 - 70 degrees.  Pt did NOT meet his mobility goals to D/C to home today.  Too sedated.   Recommendations for follow up therapy are one component of a multi-disciplinary discharge planning process, led by the attending physician.  Recommendations may be updated based on patient status, additional functional criteria and insurance authorization.  Follow Up Recommendations  Follow physician's recommendations for discharge plan and follow up therapies     Assistance Recommended at Discharge Intermittent Supervision/Assistance  Patient can return home with the following A little help with walking and/or transfers;A little help with bathing/dressing/bathroom;Assistance with cooking/housework;Assist for transportation;Help with stairs or ramp for entrance   Equipment Recommendations  Rolling walker (2 wheels);BSC/3in1    Recommendations for Other Services       Precautions / Restrictions Precautions Precautions: Fall;Knee Precaution Comments: no pillow under the knee Restrictions Weight Bearing Restrictions: No LLE Weight Bearing: Weight bearing as tolerated     Mobility  Bed Mobility Overal bed mobility: Needs  Assistance Bed Mobility: Sit to Supine     Supine to sit: Min assist Sit to supine: Mod assist   General bed mobility comments: Mod assist to support l LE back to bed, positioned to comfort and applied CPM 0 - 70 degrees.    Transfers Overall transfer level: Needs assistance Equipment used: Rolling walker (2 wheels) Transfers: Sit to/from Stand Sit to Stand: Min assist, Mod assist           General transfer comment: required increased asisst due to level of grogginess.  Unstaedy.  Still drunk and asking for more pain medication.    Ambulation/Gait Ambulation/Gait assistance: Min assist, Mod assist Gait Distance (Feet): 18 Feet Assistive device: Rolling walker (2 wheels) Gait Pattern/deviations: Step-to pattern, Decreased stance time - left Gait velocity: decreased     General Gait Details: pt required increased assit due to decreased level of alertness.  VERY unsteady gait.  HIGH FALL RISK.  Still drunken gait and asking for more pain medication.   Stairs             Wheelchair Mobility    Modified Rankin (Stroke Patients Only)       Balance                                            Cognition Arousal/Alertness: Lethargic Behavior During Therapy: Flat affect Overall Cognitive Status: Within Functional Limits for tasks assessed                                 General  Comments: pt is drowsy/drunk/sleepy requiring repeat instructions and unable to sustain alertness        Exercises      General Comments        Pertinent Vitals/Pain Pain Assessment Pain Assessment: 0-10 Pain Score: 10-Worst pain ever Pain Location: L knee Pain Descriptors / Indicators: Operative site guarding, Throbbing Pain Intervention(s): Monitored during session, Premedicated before session, Repositioned, Ice applied    Home Living                          Prior Function            PT Goals (current goals can now be found  in the care plan section) Progress towards PT goals: Progressing toward goals    Frequency    7X/week      PT Plan Current plan remains appropriate    Co-evaluation              AM-PAC PT "6 Clicks" Mobility   Outcome Measure  Help needed turning from your back to your side while in a flat bed without using bedrails?: A Little Help needed moving from lying on your back to sitting on the side of a flat bed without using bedrails?: A Little Help needed moving to and from a bed to a chair (including a wheelchair)?: A Little Help needed standing up from a chair using your arms (e.g., wheelchair or bedside chair)?: A Little Help needed to walk in hospital room?: A Lot Help needed climbing 3-5 steps with a railing? : Total 6 Click Score: 15    End of Session Equipment Utilized During Treatment: Gait belt Activity Tolerance: Other (comment) (groggy) Patient left: in bed Nurse Communication: Mobility status PT Visit Diagnosis: Pain;Difficulty in walking, not elsewhere classified (R26.2) Pain - Right/Left: Left Pain - part of body: Knee     Time: 2355-7322 PT Time Calculation (min) (ACUTE ONLY): 24 min  Charges:  $Gait Training: 8-22 mins $Therapeutic Activity: 8-22 mins                     {Leonila Speranza  PTA Acute  Rehabilitation [employer] M-F          [phone] Weekend pager [phone]

## 2022-02-04 NOTE — Progress Notes (Signed)
Physical Therapy Treatment Patient Details Name: [name] MRN: [id number] DOB: 1955-05-05 Today's Date: 02/04/2022   History of Present Illness Pt is a 66yo male presenting s/p L-TKA on 02/02/22. PMH: hx of STEMI, AICD, CHF, CKD4, DM w/ peripheral neuropathy, hyperparathyroidism, HTN.    PT Comments    POD # 2 am session General Comments: pt is drowsy/drunk/sleepy requiring repeat instructions and unable to sustain alertness Assisted OOB to amb in hallway was difficult.  General transfer comment: required increased asisst due to level of grogginess.  Unsteady.  Drunk.  Near fall when turning to get closer to recliner/Therapist recovered using safety belt. General Gait Details: pt required increased assit due to decreased level of alertness.  VERY unsteady gait.  HIGH FALL RISK. Unable to complete TE's as pt was falling asleep.   Recommendations for follow up therapy are one component of a multi-disciplinary discharge planning process, led by the attending physician.  Recommendations may be updated based on patient status, additional functional criteria and insurance authorization.  Follow Up Recommendations  Follow physician's recommendations for discharge plan and follow up therapies     Assistance Recommended at Discharge Intermittent Supervision/Assistance  Patient can return home with the following A little help with walking and/or transfers;A little help with bathing/dressing/bathroom;Assistance with cooking/housework;Assist for transportation;Help with stairs or ramp for entrance   Equipment Recommendations  Rolling walker (2 wheels);BSC/3in1    Recommendations for Other Services       Precautions / Restrictions Precautions Precautions: Fall;Knee Precaution Comments: no pillow under the knee Restrictions Weight Bearing Restrictions: No LLE Weight Bearing: Weight bearing as tolerated     Mobility  Bed Mobility Overal bed mobility: Needs Assistance Bed Mobility:  Supine to Sit     Supine to sit: Min assist     General bed mobility comments: increased time    Transfers Overall transfer level: Needs assistance Equipment used: Rolling walker (2 wheels) Transfers: Sit to/from Stand Sit to Stand: Min assist, Mod assist           General transfer comment: required increased asisst due to level of grogginess.  Unstaedy.  Drunk.  Near fall when turning to get closer to recliner/Therapist recovered using safety belt.    Ambulation/Gait Ambulation/Gait assistance: Min assist, Mod assist Gait Distance (Feet): 24 Feet Assistive device: Rolling walker (2 wheels) Gait Pattern/deviations: Step-to pattern, Decreased stance time - left Gait velocity: decreased     General Gait Details: pt required increased assit due to decreased level of alertness.  VERY unsteady gait.  HIGH FALL RISK.   Stairs             Wheelchair Mobility    Modified Rankin (Stroke Patients Only)       Balance                                            Cognition Arousal/Alertness: Lethargic Behavior During Therapy: Flat affect Overall Cognitive Status: Within Functional Limits for tasks assessed                                 General Comments: pt is drowsy/drunk/sleepy requiring repeat instructions and unable to sustain alertness        Exercises      General Comments        Pertinent Vitals/Pain Pain Assessment Pain  Assessment: 0-10 Pain Score: 10-Worst pain ever Pain Location: L knee Pain Descriptors / Indicators: Operative site guarding, Throbbing Pain Intervention(s): Monitored during session, Premedicated before session, Repositioned, Ice applied    Home Living                          Prior Function            PT Goals (current goals can now be found in the care plan section) Progress towards PT goals: Progressing toward goals    Frequency    7X/week      PT Plan Current plan  remains appropriate    Co-evaluation              AM-PAC PT "6 Clicks" Mobility   Outcome Measure  Help needed turning from your back to your side while in a flat bed without using bedrails?: A Little Help needed moving from lying on your back to sitting on the side of a flat bed without using bedrails?: A Little Help needed moving to and from a bed to a chair (including a wheelchair)?: A Little Help needed standing up from a chair using your arms (e.g., wheelchair or bedside chair)?: A Little Help needed to walk in hospital room?: A Lot Help needed climbing 3-5 steps with a railing? : Total 6 Click Score: 15    End of Session Equipment Utilized During Treatment: Gait belt Activity Tolerance: Other (comment) (groggy) Patient left: in chair;with call bell/phone within reach;with chair alarm set Nurse Communication: Mobility status PT Visit Diagnosis: Pain;Difficulty in walking, not elsewhere classified (R26.2) Pain - Right/Left: Left Pain - part of body: Knee     Time: 6578-4696 PT Time Calculation (min) (ACUTE ONLY): 23 min  Charges:  $Gait Training: 8-22 mins $Therapeutic Activity: 8-22 mins                     Rica Koyanagi  PTA [place] [facility] M-F          [phone] Weekend pager [phone]

## 2022-02-04 NOTE — Progress Notes (Signed)
SPORTS MEDICINE AND JOINT REPLACEMENT  Lara Mulch, MD    Carlyon Shadow, PA-C [address], [place], [place]  [address]                             [phone]   PROGRESS NOTE  Subjective:  negative for Chest Pain  negative for Shortness of Breath  negative for Nausea/Vomiting   negative for Calf Pain  negative for Bowel Movement   Tolerating Diet: yes         Patient reports pain as 6 on 0-10 scale.    Objective: Vital signs in last 24 hours:   Patient Vitals for the past 24 hrs:  BP Temp Temp src Pulse Resp SpO2  02/04/22 1304 (!) 153/110 98.2 F (36.8 C) Oral 71 16 98 %  02/04/22 0603 135/84 98 F (36.7 C) Oral 72 17 94 %  02/04/22 0550 (!) 137/104 98.1 F (36.7 C) Oral 73 18 96 %  02/04/22 0127 (!) 142/97 98.4 F (36.9 C) Oral 77 17 95 %  02/03/22 2113 138/83 98 F (36.7 C) Oral 77 17 95 %  02/03/22 1757 (!) 137/94 98.1 F (36.7 C) -- 82 16 90 %    @flow {1959:LAST@   Intake/Output from previous day:   10/03 0701 - 10/04 0700 In: 360 [P.O.:360] Out: 300 [Urine:300]   Intake/Output this shift:   10/04 0701 - 10/04 1900 In: -  Out: 600 [Urine:600]   Intake/Output      10/03 0701 10/04 0700 10/04 0701 10/05 0700   P.O. 360    I.V. (mL/kg) 0 (0)    IV Piggyback 0    Total Intake(mL/kg) 360 (3.8)    Urine (mL/kg/hr) 300 (0.1) 600 (0.7)   Stool 0    Blood     Total Output 300 600   Net +60 -600        Urine Occurrence 2 x    Stool Occurrence 0 x       LABORATORY DATA: Recent Labs    02/02/22 0635  WBC 4.7  HGB 11.6*  HCT 35.1*  PLT 163   Recent Labs    02/02/22 0635  NA 140  K 4.3  CL 113*  CO2 22  BUN 29*  CREATININE 2.86*  GLUCOSE 105*  CALCIUM 10.5*   Lab Results  Component Value Date   INR 1.0 09/26/2019   INR 1.0 03/15/2008    Examination:  General appearance: alert, cooperative, and no distress Extremities: extremities normal, atraumatic, no cyanosis or edema  Wound Exam: clean, dry, intact   Drainage:   None: wound tissue dry  Motor Exam: Quadriceps and Hamstrings Intact  Sensory Exam: Superficial Peroneal, Deep Peroneal, and Tibial normal   Assessment:    2 Days Post-Op  Procedure(s) (LRB): TOTAL KNEE ARTHROPLASTY (Left)  ADDITIONAL DIAGNOSIS:  Principal Problem:   S/P total knee replacement     Plan: Physical Therapy as ordered Weight Bearing as Tolerated (WBAT)  DVT Prophylaxis:  Aspirin  DISCHARGE PLAN: Home  Patient still limited by pain and lack of progress with PT. Plan on D/C home tomorrow        Donia Ast 02/04/2022, 4:38 PM

## 2022-02-05 MED ORDER — METHOCARBAMOL 500 MG PO TABS: 500.0000 mg | ORAL_TABLET | Freq: Four times a day (QID) | ORAL | 0 refills | Status: DC | PRN

## 2022-02-05 MED ORDER — ASPIRIN 81 MG PO CHEW: 81.0000 mg | CHEWABLE_TABLET | Freq: Two times a day (BID) | ORAL | 0 refills | Status: DC

## 2022-02-05 MED ORDER — HYDROMORPHONE HCL 2 MG PO TABS: 2.0000 mg | ORAL_TABLET | Freq: Four times a day (QID) | ORAL | 0 refills | Status: AC | PRN

## 2022-02-05 NOTE — Plan of Care (Signed)
Plan of care reviewed and discussed. °

## 2022-02-05 NOTE — Progress Notes (Signed)
Physical Therapy Treatment Patient Details Name: [name] MRN: [id number] DOB: 1955-12-25 Today's Date: 02/05/2022   History of Present Illness Pt is a 66yo male presenting s/p L-TKA on 02/02/22. PMH: hx of STEMI, AICD, CHF, CKD4, DM w/ peripheral neuropathy, hyperparathyroidism, HTN.    PT Comments    POD # 3 am session Pt was difficult to arouse.  General Comments: Pt is AxO x 3 lives with his mother which he cares for "some" and plans to return home.  His Brother is the one family member who is picking him up but also works. Assisted OOB to amb in hallway required increased time.  General bed mobility comments: increased, increased time.  Very slwo moving but self able.  Still sleepy/drowsy. General transfer comment: pt self able with increased time but also still groggy/drunk.  Required 50% VC's on safety with turns and proper stand to sit in recliner/proper targeting and hand placement to control desend. General Gait Details: pt self able to amb with walker but present with shuffled/groggy gait.  Delayed balance correction responce.  HIGH FALL RISK.  Carried back to room in recliner. Breakfast tray set up as it was untouched.  Pt asking for more pain meds.  Educated on time frame and amount/not yet time for more and pt appears slow/groggy/sleepy.  Have not seen any family here during day to observe pt's performance.  I called Brother to come in for a Physical Therapy Session for [facility] Education prior to D/C.   Brother "[name]" agreed to come today at noon.     Recommendations for follow up therapy are one component of a multi-disciplinary discharge planning process, led by the attending physician.  Recommendations may be updated based on patient status, additional functional criteria and insurance authorization.  Follow Up Recommendations  Follow physician's recommendations for discharge plan and follow up therapies     Assistance Recommended at Discharge Intermittent  Supervision/Assistance  Patient can return home with the following A little help with walking and/or transfers;A little help with bathing/dressing/bathroom;Assistance with cooking/housework;Assist for transportation;Help with stairs or ramp for entrance   Equipment Recommendations  Rolling walker (2 wheels);BSC/3in1    Recommendations for Other Services       Precautions / Restrictions Precautions Precautions: Fall;Knee Precaution Comments: no pillow under the knee Restrictions Weight Bearing Restrictions: No LLE Weight Bearing: Weight bearing as tolerated     Mobility  Bed Mobility Overal bed mobility: Needs Assistance Bed Mobility: Supine to Sit     Supine to sit: Supervision, Min guard     General bed mobility comments: increased, increased time.  Very slwo moving but self able.  Still sleepy/drowsy.    Transfers Overall transfer level: Needs assistance Equipment used: Rolling walker (2 wheels) Transfers: Sit to/from Stand Sit to Stand: Supervision, Min guard           General transfer comment: pt self able with increased time but also still groggy/drunk.  Required 50% VC's on safety with turns and proper stand to sit in recliner/proper targeting and hand placement to control desend.    Ambulation/Gait Ambulation/Gait assistance: Supervision, Min guard Gait Distance (Feet): 45 Feet Assistive device: Rolling walker (2 wheels) Gait Pattern/deviations: Step-to pattern, Decreased stance time - left Gait velocity: decreased     General Gait Details: pt self able to amb with walker but present with shuffled/groggy gait.  Delayed balance correction responce.  HIGH FALL RISK.   Stairs Stairs:  (NO stairs/has a RAMP)  Wheelchair Mobility    Modified Rankin (Stroke Patients Only)       Balance                                            Cognition Arousal/Alertness: Awake/alert, Lethargic (50/50) Behavior During Therapy:  Flat affect Overall Cognitive Status: Within Functional Limits for tasks assessed                                 General Comments: Pt is AxO x 3 lives with his mother which he cares for "some" and plans to return home.  His Brother is the one family member who is picking him up but also works.        Exercises      General Comments        Pertinent Vitals/Pain Pain Assessment Pain Assessment: 0-10 Pain Score: 7  Pain Location: L knee, Pt cosistantly reports a high pain number. Pain Descriptors / Indicators: Operative site guarding, Throbbing Pain Intervention(s): Monitored during session, Premedicated before session, Repositioned, Ice applied    Home Living                          Prior Function            PT Goals (current goals can now be found in the care plan section) Progress towards PT goals: Progressing toward goals    Frequency    7X/week      PT Plan Current plan remains appropriate    Co-evaluation              AM-PAC PT "6 Clicks" Mobility   Outcome Measure  Help needed turning from your back to your side while in a flat bed without using bedrails?: A Little Help needed moving from lying on your back to sitting on the side of a flat bed without using bedrails?: A Little Help needed moving to and from a bed to a chair (including a wheelchair)?: A Little Help needed standing up from a chair using your arms (e.g., wheelchair or bedside chair)?: A Little Help needed to walk in hospital room?: A Little Help needed climbing 3-5 steps with a railing? : A Little 6 Click Score: 18    End of Session Equipment Utilized During Treatment: Gait belt Activity Tolerance: Patient tolerated treatment well Patient left: in chair;with call bell/phone within reach;with chair alarm set Nurse Communication: Mobility status PT Visit Diagnosis: Pain;Difficulty in walking, not elsewhere classified (R26.2) Pain - Right/Left: Left Pain -  part of body: Knee     Time: 1004-1030 PT Time Calculation (min) (ACUTE ONLY): 26 min  Charges:  $Gait Training: 8-22 mins $Therapeutic Activity: 8-22 mins                     Rica Koyanagi  PTA [place] [facility] M-F          [phone] Weekend pager [phone]

## 2022-02-05 NOTE — Progress Notes (Signed)
Physical Therapy Treatment Patient Details Name: [name] MRN: [id number] DOB: 08/01/1955 Today's Date: 02/05/2022   History of Present Illness Pt is a 66yo male presenting s/p L-TKA on 02/02/22. PMH: hx of STEMI, AICD, CHF, CKD4, DM w/ peripheral neuropathy, hyperparathyroidism, HTN.    PT Comments    POD # 3 pm session with [name] Pt still OOB in recliner.  General transfer comment: had [name] "hands on" assist pt from recliner using safety belt.  Pt attempted x 2 due to posterior/LEFT lean.  General Gait Details: Had [name] "hands on" asisst pt amb to bathroom as well as in hallway using safety belt.  VERY unsteady gait with self TTWBing due to pain.  Near fall exiting/turning out of bathroom.  At that point, [name] and Pt agreed "not safe to go home".  Pt lives with [name] who is 66 years old and physically unable to help.  [name] works during day.  Reported to RN.  Will consult LPT.  Recommendations for follow up therapy are one component of a multi-disciplinary discharge planning process, led by the attending physician.  Recommendations may be updated based on patient status, additional functional criteria and insurance authorization.  Follow Up Recommendations  Skilled nursing-short term rehab (<3 hours/day)     Assistance Recommended at Discharge Intermittent Supervision/Assistance  Patient can return home with the following A little help with walking and/or transfers;A little help with bathing/dressing/bathroom;Assistance with cooking/housework;Assist for transportation;Help with stairs or ramp for entrance   Equipment Recommendations  Rolling walker (2 wheels);BSC/3in1    Recommendations for Other Services       Precautions / Restrictions Precautions Precautions: Fall;Knee Precaution Comments: no pillow under the knee Restrictions Weight Bearing Restrictions: No LLE Weight Bearing: Weight bearing as tolerated     Mobility  Bed Mobility     General  bed mobility comments: OOB in recliner    Transfers Overall transfer level: Needs assistance Equipment used: Rolling walker (2 wheels) Transfers: Sit to/from Stand Sit to Stand: Min assist, Mod assist           General transfer comment: had [name] "hands on" assist pt from recliner using safety belt.  Pt attempted x 2 due to posterior/LEFT lean.    Ambulation/Gait Ambulation/Gait assistance: Min assist, Mod assist Gait Distance (Feet): 30 Feet Assistive device: Rolling walker (2 wheels) Gait Pattern/deviations: Step-to pattern, Decreased stance time - left Gait velocity: decreased     General Gait Details: Had [name] "hands on" asisst pt amb to bathroom as well as in hallway using safety belt.  VERY unsteady gait with self TTWBing due to pain.  Near fall exiting/turning out of bathroom.  At that point, [name] and Pt agreed "not safe to go home".  Pt lives with [name] who is 66 years old and pgysically unable to help.  [name] works during day.   Stairs Stairs:  (NO stairs/has a RAMP)           Wheelchair Mobility    Modified Rankin (Stroke Patients Only)       Balance                                            Cognition Arousal/Alertness: Awake/alert, Lethargic (50/50) Behavior During Therapy: Flat affect Overall Cognitive Status: Within Functional Limits for tasks assessed  General Comments: Pt is AxO x 3 lives with [name] which he cares for "some" and plans to return home.  Clifford [name] is the one family member who is picking him up but also works.        Exercises      General Comments        Pertinent Vitals/Pain Pain Assessment Pain Assessment: 0-10 Pain Score: 7  Pain Location: L knee, Pt cosistantly reports a high pain number. Pain Descriptors / Indicators: Operative site guarding, Throbbing Pain Intervention(s): Monitored during session, Premedicated before session,  Repositioned, Ice applied    Home Living                          Prior Function            PT Goals (current goals can now be found in the care plan section) Progress towards PT goals: Progressing toward goals    Frequency    7X/week      PT Plan Current plan remains appropriate    Co-evaluation              AM-PAC PT "6 Clicks" Mobility   Outcome Measure  Help needed turning from your back to your side while in a flat bed without using bedrails?: A Little Help needed moving from lying on your back to sitting on the side of a flat bed without using bedrails?: A Little Help needed moving to and from a bed to a chair (including a wheelchair)?: A Lot Help needed standing up from a chair using your arms (e.g., wheelchair or bedside chair)?: A Lot Help needed to walk in hospital room?: A Lot Help needed climbing 3-5 steps with a railing? : A Lot 6 Click Score: 14    End of Session Equipment Utilized During Treatment: Gait belt Activity Tolerance: Patient limited by fatigue;Patient limited by pain Patient left: in chair;with call bell/phone within reach;with chair alarm set Nurse Communication: Mobility status PT Visit Diagnosis: Pain;Difficulty in walking, not elsewhere classified (R26.2) Pain - Right/Left: Left Pain - part of body: Knee     Time: 1203-1228 PT Time Calculation (min) (ACUTE ONLY): 25 min  Charges:  $Gait Training: 8-22 mins $Therapeutic Activity: 8-22 mins                     Rica Koyanagi  PTA [place] [facility] M-F          [phone] Weekend pager [phone]

## 2022-02-05 NOTE — Discharge Summary (Addendum)
SPORTS MEDICINE & JOINT REPLACEMENT   Lara Mulch, MD   Carlyon Shadow, PA-C [address], [place], [place]  [address]                             [phone]  PATIENT ID: [name]        MRN:  [id number]          DOB/AGE: 11/15/1955 / 66 y.o.    DISCHARGE SUMMARY  ADMISSION DATE:    02/02/2022 DISCHARGE DATE:   02/06/2022  ADMISSION DIAGNOSIS: S/P total knee replacement [Z96.659]    DISCHARGE DIAGNOSIS:  Osteoarthritis of left knee M17.12    ADDITIONAL DIAGNOSIS: Principal Problem:   S/P total knee replacement  Past Medical History:  Diagnosis Date   Acute ST elevation myocardial infarction (STEMI) due to occlusion of mid portion of left anterior descending (LAD) coronary artery ([place]) 24/01/7352   Acute systolic heart failure (HCC)    AICD (automatic cardioverter/defibrillator) present    Arthritis    CHF (congestive heart failure) (HCC)    CKD (chronic kidney disease) stage 4, GFR 15-29 ml/min (HCC)    Diabetes mellitus, type II ([place])    Dyspnea    with exertionm periodically   Heart attack ([place]) 03/15/2019   anterior MI    History of kidney stones    Hyperparathyroidism ([place])    Hypertension 2002   Ischemic cardiomyopathy    Left knee pain    Syncope 04/04/2019   after blood draw   Vitamin D deficiency     PROCEDURE: Procedure(s): TOTAL KNEE ARTHROPLASTY on 02/02/2022  CONSULTS:    HISTORY:  See H&P in chart  HOSPITAL COURSE:  [name] is a 66 y.o. admitted on 02/02/2022 and found to have a diagnosis of Osteoarthritis of left knee M17.12.  After appropriate laboratory studies were obtained  they were taken to the operating room on 02/02/2022 and underwent Procedure(s): TOTAL KNEE ARTHROPLASTY.   They were given perioperative antibiotics:  Anti-infectives (From admission, onward)    Start     Dose/Rate Route Frequency Ordered Stop   02/02/22 0600  ceFAZolin (ANCEF) IVPB 2g/100 mL premix        2 g 200 mL/hr over 30 Minutes Intravenous  On call to O.R. 02/02/22 2992 02/02/22 0845     .  Patient given tranexamic acid IV or topical and exparel intra-operatively.  Tolerated the procedure well.    POD# 1: Vital signs were stable.  Patient denied Chest pain, shortness of breath, or calf pain.  Patient was started on Aspirin twice daily at 8am.  Consults to PT, OT, and care management were made.  The patient was weight bearing as tolerated.  CPM was placed on the operative leg 0-90 degrees for 6-8 hours a day. When out of the CPM, patient was placed in the foam block to achieve full extension. Incentive spirometry was taught.  Dressing was changed.       POD #2, Continued  PT for ambulation and exercise program.  IV saline locked.  O2 discontinued.    The remainder of the hospital course was dedicated to ambulation and strengthening.   The patient was discharged on 3 Days Post-Op in  Good condition.  Blood products given:none  DIAGNOSTIC STUDIES: Recent vital signs: Patient Vitals for the past 24 hrs:  BP Temp Temp src Pulse Resp SpO2  02/05/22 0630 (!) 164/107 98.3 F (36.8 C) Oral 81 20 97 %  02/05/22 0007 133/89 99.2 F (37.3 C) Oral 77 16 95 %  02/04/22 1304 (!) 153/110 98.2 F (36.8 C) Oral 71 16 98 %       Recent laboratory studies: Recent Labs    02/02/22 0635  WBC 4.7  HGB 11.6*  HCT 35.1*  PLT 163   Recent Labs    02/02/22 0635  NA 140  K 4.3  CL 113*  CO2 22  BUN 29*  CREATININE 2.86*  GLUCOSE 105*  CALCIUM 10.5*   Lab Results  Component Value Date   INR 1.0 09/26/2019   INR 1.0 03/15/2008     Recent Radiographic Studies :  No results found.  DISCHARGE INSTRUCTIONS: Discharge Instructions     Call MD / Call 911   Complete by: As directed    If you experience chest pain or shortness of breath, CALL 911 and be transported to the hospital emergency room.  If you develope a fever above 101 F, pus (white drainage) or increased drainage or redness at the wound, or calf pain, call your  surgeon's office.   Constipation Prevention   Complete by: As directed    Drink plenty of fluids.  Prune juice may be helpful.  You may use a stool softener, such as Colace (over the counter) 100 mg twice a day.  Use MiraLax (over the counter) for constipation as needed.   Diet - low sodium heart healthy   Complete by: As directed    Discharge instructions   Complete by: As directed    INSTRUCTIONS AFTER JOINT REPLACEMENT   Remove items at home which could result in a fall. This includes throw rugs or furniture in walking pathways ICE to the affected joint every three hours while awake for 30 minutes at a time, for at least the first 3-5 days, and then as needed for pain and swelling.  Continue to use ice for pain and swelling. You may notice swelling that will progress down to the foot and ankle.  This is normal after surgery.  Elevate your leg when you are not up walking on it.   Continue to use the breathing machine you got in the hospital (incentive spirometer) which will help keep your temperature down.  It is common for your temperature to cycle up and down following surgery, especially at night when you are not up moving around and exerting yourself.  The breathing machine keeps your lungs expanded and your temperature down.   DIET:  As you were doing prior to hospitalization, we recommend a well-balanced diet.  DRESSING / WOUND CARE / SHOWERING  Keep the surgical dressing until follow up.  The dressing is water proof, so you can shower without any extra covering.  IF THE DRESSING FALLS OFF or the wound gets wet inside, change the dressing with sterile gauze.  Please use good hand washing techniques before changing the dressing.  Do not use any lotions or creams on the incision until instructed by your surgeon.    ACTIVITY  Increase activity slowly as tolerated, but follow the weight bearing instructions below.   No driving for 6 weeks or until further direction given by your  physician.  You cannot drive while taking narcotics.  No lifting or carrying greater than 10 lbs. until further directed by your surgeon. Avoid periods of inactivity such as sitting longer than an hour when not asleep. This helps prevent blood clots.  You may return to work once you are authorized by your doctor.  WEIGHT BEARING   Weight bearing as tolerated with assist device (walker, cane, etc) as directed, use it as long as suggested by your surgeon or therapist, typically at least 4-6 weeks.   EXERCISES  Results after joint replacement surgery are often greatly improved when you follow the exercise, range of motion and muscle strengthening exercises prescribed by your doctor. Safety measures are also important to protect the joint from further injury. Any time any of these exercises cause you to have increased pain or swelling, decrease what you are doing until you are comfortable again and then slowly increase them. If you have problems or questions, call your caregiver or physical therapist for advice.   Rehabilitation is important following a joint replacement. After just a few days of immobilization, the muscles of the leg can become weakened and shrink (atrophy).  These exercises are designed to build up the tone and strength of the thigh and leg muscles and to improve motion. Often times heat used for twenty to thirty minutes before working out will loosen up your tissues and help with improving the range of motion but do not use heat for the first two weeks following surgery (sometimes heat can increase post-operative swelling).   These exercises can be done on a training (exercise) mat, on the floor, on a table or on a bed. Use whatever works the best and is most comfortable for you.    Use music or television while you are exercising so that the exercises are a pleasant break in your day. This will make your life better with the exercises acting as a break in your routine that you  can look forward to.   Perform all exercises about fifteen times, three times per day or as directed.  You should exercise both the operative leg and the other leg as well.  Exercises include:   Quad Sets - Tighten up the muscle on the front of the thigh (Quad) and hold for 5-10 seconds.   Straight Leg Raises - With your knee straight (if you were given a brace, keep it on), lift the leg to 60 degrees, hold for 3 seconds, and slowly lower the leg.  Perform this exercise against resistance later as your leg gets stronger.  Leg Slides: Lying on your back, slowly slide your foot toward your buttocks, bending your knee up off the floor (only go as far as is comfortable). Then slowly slide your foot back down until your leg is flat on the floor again.  Angel Wings: Lying on your back spread your legs to the side as far apart as you can without causing discomfort.  Hamstring Strength:  Lying on your back, push your heel against the floor with your leg straight by tightening up the muscles of your buttocks.  Repeat, but this time bend your knee to a comfortable angle, and push your heel against the floor.  You may put a pillow under the heel to make it more comfortable if necessary.   A rehabilitation program following joint replacement surgery can speed recovery and prevent re-injury in the future due to weakened muscles. Contact your doctor or a physical therapist for more information on knee rehabilitation.    CONSTIPATION  Constipation is defined medically as fewer than three stools per week and severe constipation as less than one stool per week.  Even if you have a regular bowel pattern at home, your normal regimen is likely to be disrupted due to multiple reasons following surgery.  Combination  of anesthesia, postoperative narcotics, change in appetite and fluid intake all can affect your bowels.   YOU MUST use at least one of the following options; they are listed in order of increasing strength to  get the job done.  They are all available over the counter, and you may need to use some, POSSIBLY even all of these options:    Drink plenty of fluids (prune juice may be helpful) and high fiber foods Colace 100 mg by mouth twice a day  Senokot for constipation as directed and as needed Dulcolax (bisacodyl), take with full glass of water  Miralax (polyethylene glycol) once or twice a day as needed.  If you have tried all these things and are unable to have a bowel movement in the first 3-4 days after surgery call either your surgeon or your primary doctor.    If you experience loose stools or diarrhea, hold the medications until you stool forms back up.  If your symptoms do not get better within 1 week or if they get worse, check with your doctor.  If you experience "the worst abdominal pain ever" or develop nausea or vomiting, please contact the office immediately for further recommendations for treatment.   ITCHING:  If you experience itching with your medications, try taking only a single pain pill, or even half a pain pill at a time.  You can also use Benadryl over the counter for itching or also to help with sleep.   TED HOSE STOCKINGS:  Use stockings on both legs until for at least 2 weeks or as directed by physician office. They may be removed at night for sleeping.  MEDICATIONS:  See your medication summary on the "After Visit Summary" that nursing will review with you.  You may have some home medications which will be placed on hold until you complete the course of blood thinner medication.  It is important for you to complete the blood thinner medication as prescribed.  PRECAUTIONS:  If you experience chest pain or shortness of breath - call 911 immediately for transfer to the hospital emergency department.   If you develop a fever greater that 101 F, purulent drainage from wound, increased redness or drainage from wound, foul odor from the wound/dressing, or calf pain - CONTACT YOUR  SURGEON.                                                   FOLLOW-UP APPOINTMENTS:  If you do not already have a post-op appointment, please call the office for an appointment to be seen by your surgeon.  Guidelines for how soon to be seen are listed in your "After Visit Summary", but are typically between 1-4 weeks after surgery.  OTHER INSTRUCTIONS:   Knee Replacement:  Do not place pillow under knee, focus on keeping the knee straight while resting. CPM instructions: 0-90 degrees, 2 hours in the morning, 2 hours in the afternoon, and 2 hours in the evening. Place foam block, curve side up under heel at all times except when in CPM or when walking.  DO NOT modify, tear, cut, or change the foam block in any way.  POST-OPERATIVE OPIOID TAPER INSTRUCTIONS: It is important to wean off of your opioid medication as soon as possible. If you do not need pain medication after your surgery it is ok to stop  day one. Opioids include: Codeine, Hydrocodone(Norco, Vicodin), Oxycodone(Percocet, oxycontin) and hydromorphone amongst others.  Long term and even short term use of opiods can cause: Increased pain response Dependence Constipation Depression Respiratory depression And more.  Withdrawal symptoms can include Flu like symptoms Nausea, vomiting And more Techniques to manage these symptoms Hydrate well Eat regular healthy meals Stay active Use relaxation techniques(deep breathing, meditating, yoga) Do Not substitute Alcohol to help with tapering If you have been on opioids for less than two weeks and do not have pain than it is ok to stop all together.  Plan to wean off of opioids This plan should start within one week post op of your joint replacement. Maintain the same interval or time between taking each dose and first decrease the dose.  Cut the total daily intake of opioids by one tablet each day Next start to increase the time between doses. The last dose that should be eliminated is  the evening dose.     MAKE SURE YOU:  Understand these instructions.  Get help right away if you are not doing well or get worse.    Thank you for letting us be a part of your medical care team.  It is a privilege we respect greatly.  We hope these instructions will help you stay on track for a fast and full recovery!   Increase activity slowly as tolerated   Complete by: As directed    Post-operative opioid taper instructions:   Complete by: As directed    POST-OPERATIVE OPIOID TAPER INSTRUCTIONS: It is important to wean off of your opioid medication as soon as possible. If you do not need pain medication after your surgery it is ok to stop day one. Opioids include: Codeine, Hydrocodone(Norco, Vicodin), Oxycodone(Percocet, oxycontin) and hydromorphone amongst others.  Long term and even short term use of opiods can cause: Increased pain response Dependence Constipation Depression Respiratory depression And more.  Withdrawal symptoms can include Flu like symptoms Nausea, vomiting And more Techniques to manage these symptoms Hydrate well Eat regular healthy meals Stay active Use relaxation techniques(deep breathing, meditating, yoga) Do Not substitute Alcohol to help with tapering If you have been on opioids for less than two weeks and do not have pain than it is ok to stop all together.  Plan to wean off of opioids This plan should start within one week post op of your joint replacement. Maintain the same interval or time between taking each dose and first decrease the dose.  Cut the total daily intake of opioids by one tablet each day Next start to increase the time between doses. The last dose that should be eliminated is the evening dose.          DISCHARGE MEDICATIONS:   Allergies as of 02/05/2022   No Known Allergies      Medication List     TAKE these medications    allopurinol 100 MG tablet Commonly known as: ZYLOPRIM Take 100 mg by mouth daily.    amoxicillin-clavulanate 875-125 MG tablet Commonly known as: AUGMENTIN Take 1 tablet by mouth every 12 (twelve) hours.   aspirin 81 MG chewable tablet Chew 1 tablet (81 mg total) by mouth 2 (two) times daily. What changed: when to take this   atorvastatin 80 MG tablet Commonly known as: LIPITOR TAKE 1 TABLET BY MOUTH DAILY AT 6PM   Brilinta 60 MG Tabs tablet Generic drug: ticagrelor TAKE (1) TABLET BY MOUTH TWICE DAILY.   carvedilol 12.5 MG tablet Commonly known  as: COREG Take 1 tablet (12.5 mg total) by mouth 2 (two) times daily with a meal.   chlorhexidine 0.12 % solution Commonly known as: Peridex Use as directed 15 mLs in the mouth or throat 2 (two) times daily.   cinacalcet 30 MG tablet Commonly known as: SENSIPAR Take 30 mg by mouth daily.   Corlanor 5 MG Tabs tablet Generic drug: ivabradine TAKE 1 TABLET BY MOUTH TWICE DAILY WITH A MEAL   diclofenac Sodium 1 % Gel Commonly known as: Voltaren Apply 2 g topically 4 (four) times daily.   furosemide 40 MG tablet Commonly known as: LASIX Take 40 mg by mouth daily as needed for fluid.   HYDROmorphone 2 MG tablet Commonly known as: Dilaudid Take 1-2 tablets (2-4 mg total) by mouth every 6 (six) hours as needed for up to 5 days for severe pain.   lidocaine 2 % solution Commonly known as: XYLOCAINE Use as directed 10 mLs in the mouth or throat every 3 (three) hours as needed for mouth pain.   methocarbamol 500 MG tablet Commonly known as: ROBAXIN Take 1-2 tablets (500-1,000 mg total) by mouth every 6 (six) hours as needed for muscle spasms.   nitroGLYCERIN 0.4 MG SL tablet Commonly known as: Nitrostat Place 1 tablet (0.4 mg total) under the tongue every 5 (five) minutes as needed.   oxyCODONE-acetaminophen 10-325 MG tablet Commonly known as: PERCOCET Take 1 tablet by mouth every 12 (twelve) hours as needed for pain.   pantoprazole 40 MG tablet Commonly known as: PROTONIX Take 1 tablet (40 mg total) by  mouth 2 (two) times daily before a meal.   sodium bicarbonate 650 MG tablet Take 650 mg by mouth 2 (two) times daily.   Vitamin D 50 MCG (2000 UT) Caps Take 2,000 Units by mouth daily with breakfast.               Durable Medical Equipment  (From admission, onward)           Start     Ordered   02/02/22 1244  DME Walker rolling  Once       Question:  Patient needs a walker to treat with the following condition  Answer:  S/P total knee replacement   02/02/22 1243   02/02/22 1244  DME 3 n 1  Once        02/02/22 1243   02/02/22 1244  DME Bedside commode  Once       Question:  Patient needs a bedside commode to treat with the following condition  Answer:  S/P total knee replacement   02/02/22 1243            FOLLOW UP VISIT:    DISPOSITION: HOME VS. SNF  Dental Antibiotics:  In most cases prophylactic antibiotics for Dental procdeures after total joint surgery are not necessary.  Exceptions are as follows:  1. History of prior total joint infection  2. Severely immunocompromised (Organ Transplant, cancer chemotherapy, Rheumatoid biologic meds such as [place])  3. Poorly controlled diabetes (A1C &gt; 8.0, blood glucose over 200)  If you have one of these conditions, contact your surgeon for an antibiotic prescription, prior to your dental procedure.   CONDITION:  Good   Donia Ast 02/05/2022, 12:04 PM

## 2022-02-05 NOTE — Plan of Care (Signed)
Plan of care reviewed. 

## 2022-02-06 NOTE — Progress Notes (Signed)
SPORTS MEDICINE AND JOINT REPLACEMENT  Lara Mulch, MD    Carlyon Shadow, PA-C [place], [place], [place]  [address]                             [phone]   PROGRESS NOTE  Subjective:  negative for Chest Pain  negative for Shortness of Breath  negative for Nausea/Vomiting   negative for Calf Pain  negative for Bowel Movement   Tolerating Diet: yes         Patient reports pain as 4 on 0-10 scale.    Objective: Vital signs in last 24 hours:   Patient Vitals for the past 24 hrs:  BP Temp Temp src Pulse Resp SpO2  02/06/22 0618 (!) 136/96 -- -- 83 18 100 %  02/05/22 2247 (!) 137/110 98.9 F (37.2 C) Oral 82 18 98 %    @flow {1959:LAST@   Intake/Output from previous day:   10/05 0701 - 10/06 0700 In: 120 [P.O.:120] Out: 1400 [Urine:1400]   Intake/Output this shift:   No intake/output data recorded.   Intake/Output      10/05 0701 10/06 0700 10/06 0701 10/07 0700   P.O. 120    Total Intake(mL/kg) 120 (1.3)    Urine (mL/kg/hr) 1400 (0.6)    Total Output 1400    Net -1280            LABORATORY DATA: Recent Labs    02/02/22 0635  WBC 4.7  HGB 11.6*  HCT 35.1*  PLT 163   Recent Labs    02/02/22 0635  NA 140  K 4.3  CL 113*  CO2 22  BUN 29*  CREATININE 2.86*  GLUCOSE 105*  CALCIUM 10.5*   Lab Results  Component Value Date   INR 1.0 09/26/2019   INR 1.0 03/15/2008    Examination:  General appearance: alert, cooperative, and no distress Extremities: extremities normal, atraumatic, no cyanosis or edema  Wound Exam: clean, dry, intact   Drainage:  None: wound tissue dry  Motor Exam: Quadriceps and Hamstrings Intact  Sensory Exam: Superficial Peroneal, Deep Peroneal, and Tibial normal   Assessment:    4 Days Post-Op  Procedure(s) (LRB): TOTAL KNEE ARTHROPLASTY (Left)  ADDITIONAL DIAGNOSIS:  Principal Problem:   S/P total knee replacement     Plan: Physical Therapy as ordered Weight Bearing as Tolerated (WBAT)  DVT  Prophylaxis:  Aspirin  DISCHARGE PLAN: Home  DISCHARGE NEEDS: HHPT     Patient pain improved, RXs sent in, D/C home today with HHPT (order placed). Patient agrees. Follow up in office next week  Donia Ast 02/06/2022, 7:24 AM

## 2022-02-06 NOTE — Progress Notes (Signed)
Physical Therapy Treatment Patient Details Name: [name] MRN: [id number] DOB: 11/19/1955 Today's Date: 02/06/2022   History of Present Illness Pt is a 66yo male presenting s/p L-TKA on 02/02/22. PMH: hx of STEMI, AICD, CHF, CKD4, DM w/ peripheral neuropathy, hyperparathyroidism, HTN.    PT Comments    Pt seen POD4 received supine in bed, flat affect, concerned to find his missing three rings, informed pt this therapist would ask RN. Demonstrated supervision for bed mobility, min guard for transfers, and min guard + for ambulation in hallway with RW 13f.. Provided HEP and pt completed exercises with safe form and minimal cuing, use of gait belt to self assist. All education completed and pt has no further questions. Pt has met mobility goals for safe discharge home, PT is signing off, should needs change please reconsult. Thank you for this referral.    Recommendations for follow up therapy are one component of a multi-disciplinary discharge planning process, led by the attending physician.  Recommendations may be updated based on patient status, additional functional criteria and insurance authorization.  Follow Up Recommendations  Skilled nursing-short term rehab (<3 hours/day)     Assistance Recommended at Discharge Intermittent Supervision/Assistance  Patient can return home with the following A little help with walking and/or transfers;A little help with bathing/dressing/bathroom;Assistance with cooking/housework;Assist for transportation;Help with stairs or ramp for entrance   Equipment Recommendations  Rolling walker (2 wheels);BSC/3in1    Recommendations for Other Services       Precautions / Restrictions Precautions Precautions: Fall;Knee Precaution Booklet Issued: No Precaution Comments: no pillow under the knee Restrictions Weight Bearing Restrictions: No LLE Weight Bearing: Weight bearing as tolerated     Mobility  Bed Mobility Overal bed mobility: Needs  Assistance Bed Mobility: Supine to Sit     Supine to sit: Supervision, Min guard     General bed mobility comments: Pt supervision for safety, no physical assist required    Transfers Overall transfer level: Needs assistance Equipment used: Rolling walker (2 wheels) Transfers: Sit to/from Stand Sit to Stand: Min guard           General transfer comment: Min guard for safety, no physical assist required.    Ambulation/Gait Ambulation/Gait assistance: Min guard, +2 safety/equipment Gait Distance (Feet): 50 Feet Assistive device: Rolling walker (2 wheels) Gait Pattern/deviations: Step-to pattern, Decreased stance time - left Gait velocity: decreased     General Gait Details: Pt ambulated with RW and min guard +2 for recliner follow safety, no physical assist required or overt LOB noted. Pt demonstrated highly antalgic gait pattern with heavy BUE use on RW but was able to make it far enough to "get into the house and into my chair."   Stairs             Wheelchair Mobility    Modified Rankin (Stroke Patients Only)       Balance Overall balance assessment: Needs assistance Sitting-balance support: Feet supported, No upper extremity supported Sitting balance-Leahy Scale: Good     Standing balance support: During functional activity, Reliant on assistive device for balance, Bilateral upper extremity supported Standing balance-Leahy Scale: Poor                              Cognition Arousal/Alertness: Awake/alert, Lethargic (50/50) Behavior During Therapy: Flat affect Overall Cognitive Status: Within Functional Limits for tasks assessed  Exercises Total Joint Exercises Ankle Circles/Pumps: AROM, Both, 10 reps Quad Sets: AROM, Left, 10 reps Short Arc Quad: Right, 10 reps, AAROM Heel Slides: AAROM, Left, 10 reps Hip ABduction/ADduction: AAROM, Left, 10 reps Straight Leg Raises: AAROM,  Left, 10 reps    General Comments        Pertinent Vitals/Pain Pain Assessment Pain Assessment: 0-10 Pain Score: 4  Pain Location: L knee, Pt cosistantly reports a high pain number. Pain Descriptors / Indicators: Operative site guarding, Throbbing Pain Intervention(s): Limited activity within patient's tolerance, Monitored during session, Repositioned, Ice applied    Home Living                          Prior Function            PT Goals (current goals can now be found in the care plan section) Acute Rehab PT Goals Patient Stated Goal: To walk without pain PT Goal Formulation: With patient Time For Goal Achievement: 02/09/22 Potential to Achieve Goals: Good Progress towards PT goals: Progressing toward goals    Frequency    7X/week      PT Plan Current plan remains appropriate    Co-evaluation              AM-PAC PT "6 Clicks" Mobility   Outcome Measure  Help needed turning from your back to your side while in a flat bed without using bedrails?: A Little Help needed moving from lying on your back to sitting on the side of a flat bed without using bedrails?: A Little Help needed moving to and from a bed to a chair (including a wheelchair)?: A Lot Help needed standing up from a chair using your arms (e.g., wheelchair or bedside chair)?: A Lot Help needed to walk in hospital room?: A Lot Help needed climbing 3-5 steps with a railing? : A Lot 6 Click Score: 14    End of Session Equipment Utilized During Treatment: Gait belt Activity Tolerance: Patient limited by fatigue;Patient limited by pain Patient left: in chair;with call bell/phone within reach;with chair alarm set Nurse Communication: Mobility status PT Visit Diagnosis: Pain;Difficulty in walking, not elsewhere classified (R26.2) Pain - Right/Left: Left Pain - part of body: Knee     Time: 1015-1040 PT Time Calculation (min) (ACUTE ONLY): 25 min  Charges:  $Gait Training: 8-22  mins $Therapeutic Exercise: 8-22 mins                     Coolidge Breeze, PT, DPT [facility] Rehabilitation Department Office: [phone] Weekend pager: [phone]   Coolidge Breeze 02/06/2022, 10:54 AM

## 2022-02-06 NOTE — TOC Transition Note (Signed)
Transition of Care [facility]) - CM/SW Discharge Note   Patient Details  Name: [name] MRN: [id number] Date of Birth: 1955-06-13  [facility]) CM/SW Contact:  Lennart Pall, LCSW Phone Number: 02/06/2022, 9:50 AM   Clinical Narrative:    Alerted by PA that dc plan has been changed to HHPT follow up.  Pt aware and agreeable and without [place] agency preference.  Referral placed with [facility].  Pt has all needed DME at home.  Plan dc today.   Final next level of care: [place] Barriers to Discharge: Barriers Resolved   Patient Goals and CMS Choice        Discharge Placement                       Discharge Plan and Services                DME Arranged: N/A DME Agency: NA       HH Arranged: PT HH Agency: [facility] Date HH Agency Contacted: 02/06/22 [place]: [phone] Representative spoke with at [place]: Otila Kluver  Social Determinants of Health ([place]) Interventions     Readmission Risk Interventions     No data to display

## 2022-02-06 NOTE — Plan of Care (Signed)
  Problem: Education: Goal: Knowledge of the prescribed therapeutic regimen will improve Outcome: Progressing   Problem: Pain Management: Goal: Pain level will decrease with appropriate interventions Outcome: Progressing   Problem: Activity: Goal: Ability to avoid complications of mobility impairment will improve Outcome: Progressing   

## 2022-02-06 NOTE — Progress Notes (Signed)
Patient's discharge completed. Patient attempting to contact ride

## 2022-02-08 DIAGNOSIS — E1122 Type 2 diabetes mellitus with diabetic chronic kidney disease: Secondary | ICD-10-CM | POA: Diagnosis not present

## 2022-02-08 DIAGNOSIS — E213 Hyperparathyroidism, unspecified: Secondary | ICD-10-CM | POA: Diagnosis not present

## 2022-02-08 DIAGNOSIS — D500 Iron deficiency anemia secondary to blood loss (chronic): Secondary | ICD-10-CM | POA: Diagnosis not present

## 2022-02-08 DIAGNOSIS — I5021 Acute systolic (congestive) heart failure: Secondary | ICD-10-CM | POA: Diagnosis not present

## 2022-02-08 DIAGNOSIS — N184 Chronic kidney disease, stage 4 (severe): Secondary | ICD-10-CM | POA: Diagnosis not present

## 2022-02-08 DIAGNOSIS — Z96652 Presence of left artificial knee joint: Secondary | ICD-10-CM | POA: Diagnosis not present

## 2022-02-08 DIAGNOSIS — Z87442 Personal history of urinary calculi: Secondary | ICD-10-CM | POA: Diagnosis not present

## 2022-02-08 DIAGNOSIS — E785 Hyperlipidemia, unspecified: Secondary | ICD-10-CM | POA: Diagnosis not present

## 2022-02-08 DIAGNOSIS — E559 Vitamin D deficiency, unspecified: Secondary | ICD-10-CM | POA: Diagnosis not present

## 2022-02-08 DIAGNOSIS — I255 Ischemic cardiomyopathy: Secondary | ICD-10-CM | POA: Diagnosis not present

## 2022-02-08 DIAGNOSIS — I252 Old myocardial infarction: Secondary | ICD-10-CM | POA: Diagnosis not present

## 2022-02-08 DIAGNOSIS — Z87891 Personal history of nicotine dependence: Secondary | ICD-10-CM | POA: Diagnosis not present

## 2022-02-08 DIAGNOSIS — Z89422 Acquired absence of other left toe(s): Secondary | ICD-10-CM | POA: Diagnosis not present

## 2022-02-08 DIAGNOSIS — Z471 Aftercare following joint replacement surgery: Secondary | ICD-10-CM | POA: Diagnosis not present

## 2022-02-08 DIAGNOSIS — I130 Hypertensive heart and chronic kidney disease with heart failure and stage 1 through stage 4 chronic kidney disease, or unspecified chronic kidney disease: Secondary | ICD-10-CM | POA: Diagnosis not present

## 2022-02-08 DIAGNOSIS — M1711 Unilateral primary osteoarthritis, right knee: Secondary | ICD-10-CM | POA: Diagnosis not present

## 2022-02-08 DIAGNOSIS — I2510 Atherosclerotic heart disease of native coronary artery without angina pectoris: Secondary | ICD-10-CM | POA: Diagnosis not present

## 2022-02-08 DIAGNOSIS — Z9181 History of falling: Secondary | ICD-10-CM | POA: Diagnosis not present

## 2022-02-08 DIAGNOSIS — Z8711 Personal history of peptic ulcer disease: Secondary | ICD-10-CM | POA: Diagnosis not present

## 2022-02-09 ENCOUNTER — Telehealth: Payer: Self-pay | Admitting: *Deleted

## 2022-02-09 ENCOUNTER — Telehealth: Payer: Self-pay | Admitting: Internal Medicine

## 2022-02-09 ENCOUNTER — Encounter: Payer: Self-pay | Admitting: *Deleted

## 2022-02-09 NOTE — Telephone Encounter (Signed)
Pt advised to call surgeon

## 2022-02-09 NOTE — Patient Outreach (Signed)
Care Coordination [facility] Note Transition Care Management Follow-up Telephone Call Date of discharge and from where: 02/06/22 from [place]   How have you been since you were released from the hospital? "I'm hurting really bad. I can't talk right now. Can you get in touch with my surgeon's office? I haven't been able to get them." Any questions or concerns? Yes. Insufficient pain management  Items Reviewed: Did the pt receive and understand the discharge instructions provided?  Unable to assess at this time due to severe pain. No one else available with patient to discuss TOC with.  Medications obtained and verified?  Attempted to verify medications but patient was unable to talk due to severe pain. Other? Yes . Reviewed telephone call to PCP regarding post surgical pain management. He was recommended to call surgeon. Per patient, he hasn't been able to reach them. Asked if I could call. After I agreed, he sas unable to discuss anything further and hung up. Any new allergies since your discharge? No  Dietary orders reviewed? No Do you have support at home? Yes   Home Care and Equipment/Supplies: Were home health services ordered? yes If so, what is the name of the agency? [facility] for [facility]  Has the agency set up a time to come to the patient's home? Unknown. Unable to address during this call. Were any new equipment or medical supplies ordered?  Yes: rolling walker, 3n1, bedside commode What is the name of the medical supply agency? unknown Were you able to get the supplies/equipment? Unable to address during this call Do you have any questions related to the use of the equipment or supplies? No  Functional Questionnaire: (I = Independent and D = Dependent) ADLs: Unable to assess b/c patient was in severe pain and did not want to answer questions today  Bathing/Dressing- Unable to assess b/c patient was in severe pain and did not want to answer questions today  Meal Prep- Unable to  assess b/c patient was in severe pain and did not want to answer questions today  Eating- Unable to assess b/c patient was in severe pain and did not want to answer questions today  Maintaining continence- Unable to assess b/c patient was in severe pain and did not want to answer questions today  Transferring/Ambulation- Unable to assess b/c patient was in severe pain and did not want to answer questions today  Managing Meds- Unable to assess b/c patient was in severe pain and did not want to answer questions today  Follow up appointments reviewed:  PCP Hospital f/u appt confirmed? No  Not indicated on discharge papers  [facility] f/u appt confirmed? Yes  Angus Seller, PT on 02/23/22 at 1:00 Are transportation arrangements needed?  Unknown at this time If their condition worsens, is the pt aware to call PCP or go to the Emergency Dept.? Yes Was the patient provided with contact information for the PCP's office or ED? Yes Was to pt encouraged to call back with questions or concerns? Yes  SDOH assessments and interventions completed:   No. Unable to assess at this time  Care Coordination Interventions Activated:  Yes   Care Coordination Interventions:  PCP follow up appointment requested Spoke with Courtney [phone]), surgical schedule for Dr Ronnie Derby, regarding patients severe pain and my uncertainty about whether he has picked up the dilaudid and robaxin since he's only mentioned percocet that was prescribed prior to surgery. She is going to call him.    Encounter Outcome:  Pt.  Request to Call Back due to severe pain and inability to answer questions at the time of call. He said that there was no one else with him that could help answer my questions. I will plan to call him back tomorrow.     Chong Sicilian, BSN, RN-BC RN Care Coordinator [place] Direct Dial: [phone] Main #: [phone]

## 2022-02-09 NOTE — Telephone Encounter (Signed)
Patient called in regard to med   Has been put on  oxyCODONE-acetaminophen 15MG   after knee surgery  And thinks that is too much would like regular script sent in oxyCODONE-acetaminophen (PERCOCET) 10-325 MG tablet   Patient wants a call back in regard.

## 2022-02-10 DIAGNOSIS — D500 Iron deficiency anemia secondary to blood loss (chronic): Secondary | ICD-10-CM | POA: Diagnosis not present

## 2022-02-10 DIAGNOSIS — I2510 Atherosclerotic heart disease of native coronary artery without angina pectoris: Secondary | ICD-10-CM | POA: Diagnosis not present

## 2022-02-10 DIAGNOSIS — I252 Old myocardial infarction: Secondary | ICD-10-CM | POA: Diagnosis not present

## 2022-02-10 DIAGNOSIS — E1122 Type 2 diabetes mellitus with diabetic chronic kidney disease: Secondary | ICD-10-CM | POA: Diagnosis not present

## 2022-02-10 DIAGNOSIS — E559 Vitamin D deficiency, unspecified: Secondary | ICD-10-CM | POA: Diagnosis not present

## 2022-02-10 DIAGNOSIS — Z87442 Personal history of urinary calculi: Secondary | ICD-10-CM | POA: Diagnosis not present

## 2022-02-10 DIAGNOSIS — M1711 Unilateral primary osteoarthritis, right knee: Secondary | ICD-10-CM | POA: Diagnosis not present

## 2022-02-10 DIAGNOSIS — Z87891 Personal history of nicotine dependence: Secondary | ICD-10-CM | POA: Diagnosis not present

## 2022-02-10 DIAGNOSIS — Z9181 History of falling: Secondary | ICD-10-CM | POA: Diagnosis not present

## 2022-02-10 DIAGNOSIS — E213 Hyperparathyroidism, unspecified: Secondary | ICD-10-CM | POA: Diagnosis not present

## 2022-02-10 DIAGNOSIS — I255 Ischemic cardiomyopathy: Secondary | ICD-10-CM | POA: Diagnosis not present

## 2022-02-10 DIAGNOSIS — I130 Hypertensive heart and chronic kidney disease with heart failure and stage 1 through stage 4 chronic kidney disease, or unspecified chronic kidney disease: Secondary | ICD-10-CM | POA: Diagnosis not present

## 2022-02-10 DIAGNOSIS — Z89422 Acquired absence of other left toe(s): Secondary | ICD-10-CM | POA: Diagnosis not present

## 2022-02-10 DIAGNOSIS — I5021 Acute systolic (congestive) heart failure: Secondary | ICD-10-CM | POA: Diagnosis not present

## 2022-02-10 DIAGNOSIS — Z96652 Presence of left artificial knee joint: Secondary | ICD-10-CM | POA: Diagnosis not present

## 2022-02-10 DIAGNOSIS — E785 Hyperlipidemia, unspecified: Secondary | ICD-10-CM | POA: Diagnosis not present

## 2022-02-10 DIAGNOSIS — N184 Chronic kidney disease, stage 4 (severe): Secondary | ICD-10-CM | POA: Diagnosis not present

## 2022-02-10 DIAGNOSIS — Z8711 Personal history of peptic ulcer disease: Secondary | ICD-10-CM | POA: Diagnosis not present

## 2022-02-10 DIAGNOSIS — Z471 Aftercare following joint replacement surgery: Secondary | ICD-10-CM | POA: Diagnosis not present

## 2022-02-11 DIAGNOSIS — I255 Ischemic cardiomyopathy: Secondary | ICD-10-CM | POA: Diagnosis not present

## 2022-02-11 DIAGNOSIS — E785 Hyperlipidemia, unspecified: Secondary | ICD-10-CM | POA: Diagnosis not present

## 2022-02-11 DIAGNOSIS — Z471 Aftercare following joint replacement surgery: Secondary | ICD-10-CM | POA: Diagnosis not present

## 2022-02-11 DIAGNOSIS — Z87891 Personal history of nicotine dependence: Secondary | ICD-10-CM | POA: Diagnosis not present

## 2022-02-11 DIAGNOSIS — N184 Chronic kidney disease, stage 4 (severe): Secondary | ICD-10-CM | POA: Diagnosis not present

## 2022-02-11 DIAGNOSIS — I2510 Atherosclerotic heart disease of native coronary artery without angina pectoris: Secondary | ICD-10-CM | POA: Diagnosis not present

## 2022-02-11 DIAGNOSIS — I252 Old myocardial infarction: Secondary | ICD-10-CM | POA: Diagnosis not present

## 2022-02-11 DIAGNOSIS — Z87442 Personal history of urinary calculi: Secondary | ICD-10-CM | POA: Diagnosis not present

## 2022-02-11 DIAGNOSIS — E559 Vitamin D deficiency, unspecified: Secondary | ICD-10-CM | POA: Diagnosis not present

## 2022-02-11 DIAGNOSIS — D500 Iron deficiency anemia secondary to blood loss (chronic): Secondary | ICD-10-CM | POA: Diagnosis not present

## 2022-02-11 DIAGNOSIS — E1122 Type 2 diabetes mellitus with diabetic chronic kidney disease: Secondary | ICD-10-CM | POA: Diagnosis not present

## 2022-02-11 DIAGNOSIS — Z8711 Personal history of peptic ulcer disease: Secondary | ICD-10-CM | POA: Diagnosis not present

## 2022-02-11 DIAGNOSIS — Z9181 History of falling: Secondary | ICD-10-CM | POA: Diagnosis not present

## 2022-02-11 DIAGNOSIS — I5021 Acute systolic (congestive) heart failure: Secondary | ICD-10-CM | POA: Diagnosis not present

## 2022-02-11 DIAGNOSIS — E213 Hyperparathyroidism, unspecified: Secondary | ICD-10-CM | POA: Diagnosis not present

## 2022-02-11 DIAGNOSIS — Z89422 Acquired absence of other left toe(s): Secondary | ICD-10-CM | POA: Diagnosis not present

## 2022-02-11 DIAGNOSIS — Z96652 Presence of left artificial knee joint: Secondary | ICD-10-CM | POA: Diagnosis not present

## 2022-02-11 DIAGNOSIS — M1711 Unilateral primary osteoarthritis, right knee: Secondary | ICD-10-CM | POA: Diagnosis not present

## 2022-02-11 DIAGNOSIS — I130 Hypertensive heart and chronic kidney disease with heart failure and stage 1 through stage 4 chronic kidney disease, or unspecified chronic kidney disease: Secondary | ICD-10-CM | POA: Diagnosis not present

## 2022-02-12 DIAGNOSIS — E785 Hyperlipidemia, unspecified: Secondary | ICD-10-CM | POA: Diagnosis not present

## 2022-02-12 DIAGNOSIS — Z8711 Personal history of peptic ulcer disease: Secondary | ICD-10-CM | POA: Diagnosis not present

## 2022-02-12 DIAGNOSIS — Z9181 History of falling: Secondary | ICD-10-CM | POA: Diagnosis not present

## 2022-02-12 DIAGNOSIS — D500 Iron deficiency anemia secondary to blood loss (chronic): Secondary | ICD-10-CM | POA: Diagnosis not present

## 2022-02-12 DIAGNOSIS — E1122 Type 2 diabetes mellitus with diabetic chronic kidney disease: Secondary | ICD-10-CM | POA: Diagnosis not present

## 2022-02-12 DIAGNOSIS — I252 Old myocardial infarction: Secondary | ICD-10-CM | POA: Diagnosis not present

## 2022-02-12 DIAGNOSIS — E559 Vitamin D deficiency, unspecified: Secondary | ICD-10-CM | POA: Diagnosis not present

## 2022-02-12 DIAGNOSIS — M1711 Unilateral primary osteoarthritis, right knee: Secondary | ICD-10-CM | POA: Diagnosis not present

## 2022-02-12 DIAGNOSIS — I5021 Acute systolic (congestive) heart failure: Secondary | ICD-10-CM | POA: Diagnosis not present

## 2022-02-12 DIAGNOSIS — N184 Chronic kidney disease, stage 4 (severe): Secondary | ICD-10-CM | POA: Diagnosis not present

## 2022-02-12 DIAGNOSIS — I2510 Atherosclerotic heart disease of native coronary artery without angina pectoris: Secondary | ICD-10-CM | POA: Diagnosis not present

## 2022-02-12 DIAGNOSIS — Z471 Aftercare following joint replacement surgery: Secondary | ICD-10-CM | POA: Diagnosis not present

## 2022-02-12 DIAGNOSIS — Z96652 Presence of left artificial knee joint: Secondary | ICD-10-CM | POA: Diagnosis not present

## 2022-02-12 DIAGNOSIS — I130 Hypertensive heart and chronic kidney disease with heart failure and stage 1 through stage 4 chronic kidney disease, or unspecified chronic kidney disease: Secondary | ICD-10-CM | POA: Diagnosis not present

## 2022-02-12 DIAGNOSIS — I255 Ischemic cardiomyopathy: Secondary | ICD-10-CM | POA: Diagnosis not present

## 2022-02-12 DIAGNOSIS — Z87442 Personal history of urinary calculi: Secondary | ICD-10-CM | POA: Diagnosis not present

## 2022-02-12 DIAGNOSIS — E213 Hyperparathyroidism, unspecified: Secondary | ICD-10-CM | POA: Diagnosis not present

## 2022-02-12 DIAGNOSIS — Z89422 Acquired absence of other left toe(s): Secondary | ICD-10-CM | POA: Diagnosis not present

## 2022-02-12 DIAGNOSIS — Z87891 Personal history of nicotine dependence: Secondary | ICD-10-CM | POA: Diagnosis not present

## 2022-02-13 ENCOUNTER — Ambulatory Visit (HOSPITAL_COMMUNITY): Payer: Medicare Other | Admitting: Physical Therapy

## 2022-02-18 ENCOUNTER — Encounter (HOSPITAL_COMMUNITY): Payer: Medicare Other

## 2022-02-20 ENCOUNTER — Ambulatory Visit (HOSPITAL_COMMUNITY): Payer: Medicare Other | Attending: Orthopedic Surgery

## 2022-02-20 DIAGNOSIS — M1712 Unilateral primary osteoarthritis, left knee: Secondary | ICD-10-CM | POA: Diagnosis not present

## 2022-02-20 DIAGNOSIS — R262 Difficulty in walking, not elsewhere classified: Secondary | ICD-10-CM | POA: Insufficient documentation

## 2022-02-20 NOTE — Therapy (Signed)
OUTPATIENT PHYSICAL THERAPY LOWER EXTREMITY EVALUATION   Patient Name: [name] MRN: [id number] DOB:Apr 05, 1956, 66 y.o., male Today's Date: 02/20/2022   PT End of Session - 02/20/22 1159     Visit Number 1    Number of Visits 8    Date for PT Re-Evaluation 02/17/22    Authorization Type UHC Medicare; no copay, 20% co insurance; no dec, no VL, no auth required    Progress Note Due on Visit 10    PT Start Time 1118    PT Stop Time 1200    PT Time Calculation (min) 42 min             Past Medical History:  Diagnosis Date   Acute ST elevation myocardial infarction (STEMI) due to occlusion of mid portion of left anterior descending (LAD) coronary artery ([place]) 34/91/7915   Acute systolic heart failure (HCC)    AICD (automatic cardioverter/defibrillator) present    Arthritis    CHF (congestive heart failure) (HCC)    CKD (chronic kidney disease) stage 4, GFR 15-29 ml/min (HCC)    Diabetes mellitus, type II ([place])    Dyspnea    with exertionm periodically   Heart attack ([place]) 03/15/2019   anterior MI    History of kidney stones    Hyperparathyroidism ([place])    Hypertension 2002   Ischemic cardiomyopathy    Left knee pain    Syncope 04/04/2019   after blood draw   Vitamin D deficiency    Past Surgical History:  Procedure Laterality Date   AMPUTATION TOE Left 1991   2 digit of left foot   BIOPSY  02/26/2021   Procedure: BIOPSY;  Surgeon: Rogene Houston, MD;  [facility];  Service: Endoscopy;;   CHOLECYSTECTOMY     COLONOSCOPY     COLONOSCOPY WITH PROPOFOL N/A 02/26/2021   Procedure: COLONOSCOPY WITH PROPOFOL;  Surgeon: Rogene Houston, MD;  [facility];  Service: Endoscopy;  Laterality: N/A;   CORONARY/GRAFT ACUTE MI REVASCULARIZATION N/A 03/12/2019   Procedure: Coronary/Graft Acute MI Revascularization;  Surgeon: Belva Crome, MD;  Location: [place] CV LAB;  Service: Cardiovascular;  Laterality: N/A;   ESOPHAGOGASTRODUODENOSCOPY  (EGD) WITH PROPOFOL N/A 02/26/2021   Procedure: ESOPHAGOGASTRODUODENOSCOPY (EGD) WITH PROPOFOL;  Surgeon: Rogene Houston, MD;  [facility];  Service: Endoscopy;  Laterality: N/A;   ICD IMPLANT N/A 08/24/2019   Procedure: ICD IMPLANT;  Surgeon: Evans Lance, MD;  Location: [place];  Service: Cardiovascular;  Laterality: N/A;   IR URETERAL STENT LEFT NEW ACCESS W/O SEP NEPHROSTOMY CATH  09/26/2019   LEFT HEART CATH AND CORONARY ANGIOGRAPHY N/A 03/12/2019   Procedure: LEFT HEART CATH AND CORONARY ANGIOGRAPHY;  Surgeon: Belva Crome, MD;  Location: [facility];  Service: Cardiovascular;  Laterality: N/A;   LITHOTRIPSY Right    NEPHROLITHOTOMY Left 09/26/2019   Procedure: LEFT NEPHROLITHOTOMY PERCUTANEOUS;  Surgeon: Irine Seal, MD;  Location: [facility];  Service: Urology;  Laterality: Left;   TOTAL KNEE ARTHROPLASTY Left 02/02/2022   Procedure: TOTAL KNEE ARTHROPLASTY;  Surgeon: Vickey Huger, MD;  Location: [facility];  Service: Orthopedics;  Laterality: Left;   Patient Active Problem List   Diagnosis Date Noted   S/P total knee replacement 02/02/2022   Encounter for general adult medical examination with abnormal findings 09/17/2021   Other hyperparathyroidism ([place]) 06/13/2021   Vitamin D deficiency 06/13/2021   Nonintractable episodic headache 04/23/2021   Primary osteoarthritis of both knees 04/23/2021  Anemia due to chronic blood loss 03/05/2021   Gastric ulcer 03/05/2021   Type 2 diabetes with nephropathy ([place])    LUQ pain    Prediabetes 02/22/2021   Left renal stone 09/26/2019   CAD in native artery    Ischemic cardiomyopathy 08/15/2019   Congestive heart failure ([place]) 03/21/2019   ST elevation myocardial infarction involving left anterior descending (LAD) coronary artery (HCC)    Chronic kidney disease, stage IV (severe) ([place]) 06/11/2016   Elevated PSA 06/11/2016   Hyperlipidemia 06/11/2016   Essential hypertension 05/28/2016     PCP: Ihor Dow  MD  REFERRING PROVIDER: Vickey Huger, MD  REFERRING DIAG: PT eval/tx for s/p lt TKA per Vickey Huger, MD  THERAPY DIAG:  Primary osteoarthritis of left knee - Plan: PT plan of care cert/re-cert  Difficulty in walking, not elsewhere classified - Plan: PT plan of care cert/re-cert  Rationale for Evaluation and Treatment Rehabilitation  ONSET DATE: 02/05/2022  SUBJECTIVE:   SUBJECTIVE STATEMENT: Left knee OA for years  PERTINENT HISTORY: 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 Yes: NPRS scale: 4-9/10 Pain location: left knee Pain description: sore, aching Aggravating factors: movement Relieving factors: rest, medicine  PRECAUTIONS: None  WEIGHT BEARING RESTRICTIONS: No  FALLS:  Has patient fallen in last 6 months? No  LIVING ENVIRONMENT: Lives with: lives with their family Lives in: House/apartment Stairs: Yes: External: 2 steps; none Has following equipment at home: Environmental consultant - 2 wheeled, Wheelchair (manual), and bed side commode  OCCUPATION:   PLOF: Independent  PATIENT GOALS: walk   OBJECTIVE:   DIAGNOSTIC FINDINGS: none  PATIENT SURVEYS:  FOTO 44  COGNITION: Overall cognitive status: Within functional limits for tasks assessed     SENSATION: WFL  EDEMA:  Normal for this time s/p     PALPATION: Tenderness normal for this time s/p  LOWER EXTREMITY ROM:  Active ROM Right eval Left eval  Hip flexion    Hip extension    Hip abduction    Hip adduction    Hip internal rotation    Hip external rotation    Knee flexion  86  Knee extension  -12  Ankle dorsiflexion    Ankle plantarflexion    Ankle inversion    Ankle eversion     (Blank rows = not tested)  LOWER EXTREMITY MMT:  MMT Right eval Left eval  Hip flexion    Hip extension    Hip abduction    Hip adduction    Hip internal rotation    Hip external rotation    Knee flexion  3- (sitting)  Knee extension  Poor to fair quad set   Ankle dorsiflexion    Ankle plantarflexion    Ankle inversion    Ankle eversion     (Blank rows = not tested)   FUNCTIONAL TESTS:  5 times sit to stand:    GAIT: Distance walked: 10 ft Assistive device utilized: Environmental consultant - 2 wheeled Level of assistance: CGA Comments: decreased heel strike and toe off; extension lag left knee; antalgic and step to gait pattern   TODAY'S TREATMENT  Physical therapy evaluation and HEP instruction  PATIENT EDUCATION:  Education details: Patient educated on exam findings, POC, scope of PT, HEP. Person educated: Patient Education method: Explanation, Demonstration, and Handouts Education comprehension: verbalized understanding, returned demonstration, verbal cues required, and tactile cues required   HOME EXERCISE PROGRAM: Access Code: [email or web address] URL: [email or web address] Date: 02/20/2022 Prepared by: [facility] - Rehab  Exercises - Supine Ankle Pumps  - 3 x daily - 7 x weekly - 1 sets - 10 reps - Supine Quad Set  - 3 x daily - 7 x weekly - 1 sets - 10 reps - Supine Straight Leg Hip Adduction and Quad Set with Ball  - 3 x daily - 7 x weekly - 1 sets - 10 reps - Supine Heel Slides  - 3 x daily - 7 x weekly - 1 sets - 10 reps - Supine Active Straight Leg Raise  - 3 x daily - 7 x weekly - 1 sets - 10 reps - Supine Hip Abduction  - 3 x daily - 7 x weekly - 1 sets - 10 reps - Seated Knee Flexion Extension AROM   - 3 x daily - 7 x weekly - 1 sets - 10 reps - Seated Knee Flexion AAROM  - 3 x daily - 7 x weekly - 1 sets - 10 reps - Seated Long Arc Quad  - 3 x daily - 7 x weekly - 1 sets - 10 reps - Supine Knee Extension Stretch on Towel Roll  - 1 x daily - 7 x weekly - 1 sets - 1-5 min hold  ASSESSMENT:  CLINICAL IMPRESSION: Patient is a 66 y.o. male who was seen today for physical therapy evaluation and treatment for PT  eval/tx for s/p lt TKA per Vickey Huger, MD . Patient arrived vomiting as he took his pain medication on an empty stomach.  Patient given emesis bag and filled it; given another.  Shortened evaluation time due to sickness.  Patient presents with limited left knee mobility and decreased left leg strength; antalgic gait, swelling and pain that all negatively impact his ability to perform household and self care tasks and take care of his mother who he lives with and cares for.  Patient will benefit from skilled therapy services to address limitations and promote optimal function.    OBJECTIVE IMPAIRMENTS: Abnormal gait, decreased activity tolerance, decreased balance, decreased endurance, decreased mobility, difficulty walking, decreased ROM, hypomobility, increased edema, increased fascial restrictions, impaired perceived functional ability, increased muscle spasms, impaired flexibility, and pain.   ACTIVITY LIMITATIONS: carrying, lifting, bending, sitting, standing, squatting, sleeping, stairs, transfers, bed mobility, locomotion level, and caring for others  PARTICIPATION LIMITATIONS: meal prep, cleaning, laundry, driving, shopping, and community activity   REHAB POTENTIAL: Good  CLINICAL DECISION MAKING: Stable/uncomplicated  EVALUATION COMPLEXITY: Low   GOALS: Goals reviewed with patient? No  SHORT TERM GOALS: Target date: 03/06/2022  Patient will be independent with initial HEP  Baseline: Goal status: INITIAL  2.  Patient will increase left knee AROM in supine to -8 to 100 to improve ability to navigate steps to enter home. Baseline: see above Goal status: INITIAL    LONG TERM GOALS: Target date: 03/20/2022   Patient will be independent in self management strategies to improve quality of life and functional outcomes.  Baseline:  Goal status: INITIAL  2.  Patient will increase left leg MMTs to 5/5 without pain to promote return to ambulation community distances with minimal  deviation.  Baseline: see above Goal status:  INITIAL  3.  Patient will improve FOTO score to predicted value  Baseline: 44 Goal status: INITIAL  4.  Patient will increase left knee AROM in supine to -2 to 120 to perform all household tasks and care for his mother without issue. Baseline:  Goal status: INITIAL  5.  Patient will perform 5 x sit to stand in 20 sec or less to demonstrate good functional mobility and increased lower extremity strength.  Baseline: please test next visit Goal status: INITIAL   PLAN:  PT FREQUENCY: 2x/week  PT DURATION: 4 weeks  PLANNED INTERVENTIONS: Therapeutic exercises, Therapeutic activity, Neuromuscular re-education, Balance training, Gait training, Patient/Family education, Joint manipulation, Joint mobilization, Stair training, Orthotic/Fit training, DME instructions, Aquatic Therapy, Dry Needling, Electrical stimulation, Spinal manipulation, Spinal mobilization, Cryotherapy, Moist heat, Compression bandaging, scar mobilization, Splintting, Taping, Traction, Ultrasound, Ionotophoresis 4mg /ml Dexamethasone, and Manual therapy  PLAN FOR NEXT SESSION: Review HEP (unable to do so at evaluation due to sickness) and goals; progress knee strengthening and mobility; 5 x sit to stand test.  12:14 PM, 02/20/22 Phyllip Claw Small Mirta Mally MPT [facility] [place] [phone] Ph:[phone]

## 2022-02-23 ENCOUNTER — Other Ambulatory Visit: Payer: Self-pay | Admitting: Internal Medicine

## 2022-02-23 ENCOUNTER — Ambulatory Visit (HOSPITAL_COMMUNITY): Payer: Medicare Other

## 2022-02-23 DIAGNOSIS — M170 Bilateral primary osteoarthritis of knee: Secondary | ICD-10-CM

## 2022-02-23 DIAGNOSIS — M1712 Unilateral primary osteoarthritis, left knee: Secondary | ICD-10-CM | POA: Diagnosis not present

## 2022-02-23 DIAGNOSIS — R262 Difficulty in walking, not elsewhere classified: Secondary | ICD-10-CM | POA: Diagnosis not present

## 2022-02-23 DIAGNOSIS — G894 Chronic pain syndrome: Secondary | ICD-10-CM

## 2022-02-23 NOTE — Therapy (Signed)
OUTPATIENT PHYSICAL THERAPY LOWER EXTREMITY EVALUATION   Patient Name: [name] MRN: [id number] DOB:1955/10/02, 66 y.o., male Today's Date: 02/23/2022   PT End of Session - 02/23/22 1528     Visit Number 2    Number of Visits 8    Date for PT Re-Evaluation 02/17/22    Authorization Type U[employer] Medicare; no copay, 20% co insurance; no dec, no VL, no auth required    Progress Note Due on Visit 10    PT Start Time 0315    PT Stop Time 0339    PT Time Calculation (min) 24 min              Past Medical History:  Diagnosis Date   Acute ST elevation myocardial infarction (STEMI) due to occlusion of mid portion of left anterior descending (LAD) coronary artery ([place]) 33/54/5625   Acute systolic heart failure (HCC)    AICD (automatic cardioverter/defibrillator) present    Arthritis    CHF (congestive heart failure) (HCC)    CKD (chronic kidney disease) stage 4, GFR 15-29 ml/min (HCC)    Diabetes mellitus, type II ([place])    Dyspnea    with exertionm periodically   Heart attack ([place]) 03/15/2019   anterior MI    History of kidney stones    Hyperparathyroidism ([place])    Hypertension 2002   Ischemic cardiomyopathy    Left knee pain    Syncope 04/04/2019   after blood draw   Vitamin D deficiency    Past Surgical History:  Procedure Laterality Date   AMPUTATION TOE Left 1991   2 digit of left foot   BIOPSY  02/26/2021   Procedure: BIOPSY;  Surgeon: Rogene Houston, MD;  [facility];  Service: Endoscopy;;   CHOLECYSTECTOMY     COLONOSCOPY     COLONOSCOPY WITH PROPOFOL N/A 02/26/2021   Procedure: COLONOSCOPY WITH PROPOFOL;  Surgeon: Rogene Houston, MD;  [facility];  Service: Endoscopy;  Laterality: N/A;   CORONARY/GRAFT ACUTE MI REVASCULARIZATION N/A 03/12/2019   Procedure: Coronary/Graft Acute MI Revascularization;  Surgeon: Belva Crome, MD;  Location: [facility];  Service: Cardiovascular;  Laterality: N/A;    ESOPHAGOGASTRODUODENOSCOPY (EGD) WITH PROPOFOL N/A 02/26/2021   Procedure: ESOPHAGOGASTRODUODENOSCOPY (EGD) WITH PROPOFOL;  Surgeon: Rogene Houston, MD;  [facility];  Service: Endoscopy;  Laterality: N/A;   ICD IMPLANT N/A 08/24/2019   Procedure: ICD IMPLANT;  Surgeon: Evans Lance, MD;  Location: [facility];  Service: Cardiovascular;  Laterality: N/A;   IR URETERAL STENT LEFT NEW ACCESS W/O SEP NEPHROSTOMY CATH  09/26/2019   LEFT HEART CATH AND CORONARY ANGIOGRAPHY N/A 03/12/2019   Procedure: LEFT HEART CATH AND CORONARY ANGIOGRAPHY;  Surgeon: Belva Crome, MD;  Location: [facility];  Service: Cardiovascular;  Laterality: N/A;   LITHOTRIPSY Right    NEPHROLITHOTOMY Left 09/26/2019   Procedure: LEFT NEPHROLITHOTOMY PERCUTANEOUS;  Surgeon: Irine Seal, MD;  Location: [facility];  Service: Urology;  Laterality: Left;   TOTAL KNEE ARTHROPLASTY Left 02/02/2022   Procedure: TOTAL KNEE ARTHROPLASTY;  Surgeon: Vickey Huger, MD;  Location: [facility];  Service: Orthopedics;  Laterality: Left;   Patient Active Problem List   Diagnosis Date Noted   S/P total knee replacement 02/02/2022   Encounter for general adult medical examination with abnormal findings 09/17/2021   Other hyperparathyroidism ([place]) 06/13/2021   Vitamin D deficiency 06/13/2021   Nonintractable episodic headache 04/23/2021   Primary osteoarthritis of both knees  04/23/2021   Anemia due to chronic blood loss 03/05/2021   Gastric ulcer 03/05/2021   Type 2 diabetes with nephropathy ([place])    LUQ pain    Prediabetes 02/22/2021   Left renal stone 09/26/2019   CAD in native artery    Ischemic cardiomyopathy 08/15/2019   Congestive heart failure ([place]) 03/21/2019   ST elevation myocardial infarction involving left anterior descending (LAD) coronary artery (HCC)    Chronic kidney disease, stage IV (severe) ([place]) 06/11/2016   Elevated PSA 06/11/2016   Hyperlipidemia 06/11/2016   Essential hypertension 05/28/2016      PCP: Ihor Dow MD  REFERRING PROVIDER: Vickey Huger, MD  REFERRING DIAG: PT eval/tx for s/p lt TKA per Vickey Huger, MD  THERAPY DIAG:  Primary osteoarthritis of left knee  Difficulty in walking, not elsewhere classified  Rationale for Evaluation and Treatment Rehabilitation  ONSET DATE: 02/05/2022  SUBJECTIVE:   SUBJECTIVE STATEMENT: Patient states he has to leave early because his ride has an appointment  PERTINENT HISTORY: 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 Yes: NPRS scale: 4-9/10 Pain location: left knee Pain description: sore, aching Aggravating factors: movement Relieving factors: rest, medicine  PRECAUTIONS: None  WEIGHT BEARING RESTRICTIONS: No  FALLS:  Has patient fallen in last 6 months? No  LIVING ENVIRONMENT: Lives with: lives with their family Lives in: House/apartment Stairs: Yes: External: 2 steps; none Has following equipment at home: Environmental consultant - 2 wheeled, Wheelchair (manual), and bed side commode  OCCUPATION:   PLOF: Independent  PATIENT GOALS: walk   OBJECTIVE:   DIAGNOSTIC FINDINGS: none  PATIENT SURVEYS:  FOTO 44  COGNITION: Overall cognitive status: Within functional limits for tasks assessed     SENSATION: WFL  EDEMA:  Normal for this time s/p     PALPATION: Tenderness normal for this time s/p  LOWER EXTREMITY ROM:  Active ROM Right eval Left eval Left 02/23/22  Hip flexion     Hip extension     Hip abduction     Hip adduction     Hip internal rotation     Hip external rotation     Knee flexion  86 96  Knee extension  -12 -15  Ankle dorsiflexion     Ankle plantarflexion     Ankle inversion     Ankle eversion      (Blank rows = not tested)  LOWER EXTREMITY MMT:  MMT Right eval Left eval  Hip flexion    Hip extension    Hip abduction    Hip adduction    Hip internal rotation    Hip external rotation    Knee flexion  3- (sitting)  Knee  extension  Poor to fair quad set  Ankle dorsiflexion    Ankle plantarflexion    Ankle inversion    Ankle eversion     (Blank rows = not tested)   FUNCTIONAL TESTS:  5 times sit to stand:    GAIT: Distance walked: 10 ft Assistive device utilized: Environmental consultant - 2 wheeled Level of assistance: CGA Comments: decreased heel strike and toe off; extension lag left knee; antalgic and step to gait pattern   TODAY'S TREATMENT  02/23/22 *shortened treatment time patient has to leave early Supine: Quad sets 5" hold x 10 Heel slides x 10 SLR with lag 2 x 10  Sitting LAQ's 2 x 10 Hip flexion 2 x 10  Gait training with RW       Physical therapy evaluation and HEP instruction    PATIENT EDUCATION:  Education details: Patient educated on exam findings, POC, scope of PT, HEP. Person educated: Patient Education method: Explanation, Demonstration, and Handouts Education comprehension: verbalized understanding, returned demonstration, verbal cues required, and tactile cues required   HOME EXERCISE PROGRAM: Access Code: [email or web address] URL: [email or web address] Date: 02/20/2022 Prepared by: [facility]  Exercises - Supine Ankle Pumps  - 3 x daily - 7 x weekly - 1 sets - 10 reps - Supine Quad Set  - 3 x daily - 7 x weekly - 1 sets - 10 reps - Supine Straight Leg Hip Adduction and Quad Set with Ball  - 3 x daily - 7 x weekly - 1 sets - 10 reps - Supine Heel Slides  - 3 x daily - 7 x weekly - 1 sets - 10 reps - Supine Active Straight Leg Raise  - 3 x daily - 7 x weekly - 1 sets - 10 reps - Supine Hip Abduction  - 3 x daily - 7 x weekly - 1 sets - 10 reps - Seated Knee Flexion Extension AROM   - 3 x daily - 7 x weekly - 1 sets - 10 reps - Seated Knee Flexion AAROM  - 3 x daily - 7 x weekly - 1 sets - 10 reps - Seated Long Arc Quad  - 3 x daily - 7 x weekly -  1 sets - 10 reps - Supine Knee Extension Stretch on Towel Roll  - 1 x daily - 7 x weekly - 1 sets - 1-5 min hold  ASSESSMENT:  CLINICAL IMPRESSION: Patient states he has to leave early today; will review goals at next visit due to time constraints.  Patient with improved knee flexion today but continues with pain with knee extension (quad sets) and limited knee extension.  Patient needs verbal cues to improve heel strike with ambulation and PT strongly encouraged patient compliance with HEP; also patient has a blue foam extension prop from the MD which he has not been using and instructed patient on the importance of getting his left knee extension back for normal gait and good quad strength.  Patient will benefit from continued skilled therapy services to address deficits and promote return to optimal function.          Eval:Patient is a 66 y.o. male who was seen today for physical therapy evaluation and treatment for PT eval/tx for s/p lt TKA per Vickey Huger, MD . Patient arrived vomiting as he took his pain medication on an empty stomach.  Patient given emesis bag and filled it; given another.  Shortened evaluation time due to sickness.  Patient presents with limited left knee mobility and decreased left leg strength; antalgic gait, swelling and pain that all negatively impact his ability to perform household and self care tasks and take care of his mother who he lives with and cares for.  Patient will benefit from skilled therapy services to address limitations and promote optimal function.    OBJECTIVE IMPAIRMENTS: Abnormal gait, decreased activity tolerance, decreased balance, decreased endurance, decreased mobility, difficulty walking, decreased ROM, hypomobility, increased edema, increased fascial restrictions, impaired perceived functional ability, increased muscle spasms, impaired  flexibility, and pain.   ACTIVITY LIMITATIONS: carrying, lifting, bending, sitting, standing, squatting,  sleeping, stairs, transfers, bed mobility, locomotion level, and caring for others  PARTICIPATION LIMITATIONS: meal prep, cleaning, laundry, driving, shopping, and community activity   REHAB POTENTIAL: Good  CLINICAL DECISION MAKING: Stable/uncomplicated  EVALUATION COMPLEXITY: Low   GOALS: Goals reviewed with patient? No  SHORT TERM GOALS: Target date: 03/06/2022  Patient will be independent with initial HEP  Baseline: Goal status: INITIAL  2.  Patient will increase left knee AROM in supine to -8 to 100 to improve ability to navigate steps to enter home. Baseline: see above Goal status: INITIAL    LONG TERM GOALS: Target date: 03/20/2022   Patient will be independent in self management strategies to improve quality of life and functional outcomes.  Baseline:  Goal status: INITIAL  2.  Patient will increase left leg MMTs to 5/5 without pain to promote return to ambulation community distances with minimal deviation.  Baseline: see above Goal status: INITIAL  3.  Patient will improve FOTO score to predicted value  Baseline: 44 Goal status: INITIAL  4.  Patient will increase left knee AROM in supine to -2 to 120 to perform all household tasks and care for his mother without issue. Baseline:  Goal status: INITIAL  5.  Patient will perform 5 x sit to stand in 20 sec or less to demonstrate good functional mobility and increased lower extremity strength.  Baseline: please test next visit Goal status: INITIAL   PLAN:  PT FREQUENCY: 2x/week  PT DURATION: 4 weeks  PLANNED INTERVENTIONS: Therapeutic exercises, Therapeutic activity, Neuromuscular re-education, Balance training, Gait training, Patient/Family education, Joint manipulation, Joint mobilization, Stair training, Orthotic/Fit training, DME instructions, Aquatic Therapy, Dry Needling, Electrical stimulation, Spinal manipulation, Spinal mobilization, Cryotherapy, Moist heat, Compression bandaging, scar  mobilization, Splintting, Taping, Traction, Ultrasound, Ionotophoresis 4mg /ml Dexamethasone, and Manual therapy  PLAN FOR NEXT SESSION: Review HEP (unable to do so at evaluation due to sickness and second visit due to leaving early) and goals; progress knee strengthening and mobility; 5 x sit to stand test.  3:52 PM, 02/23/22 [name] MPT [facility]  [phone] Ph:[phone]

## 2022-02-25 ENCOUNTER — Encounter (HOSPITAL_COMMUNITY): Payer: Medicare Other

## 2022-02-26 ENCOUNTER — Ambulatory Visit (HOSPITAL_COMMUNITY): Payer: Medicare Other

## 2022-02-26 ENCOUNTER — Encounter (HOSPITAL_COMMUNITY): Payer: Self-pay

## 2022-02-26 DIAGNOSIS — M1712 Unilateral primary osteoarthritis, left knee: Secondary | ICD-10-CM | POA: Diagnosis not present

## 2022-02-26 DIAGNOSIS — R262 Difficulty in walking, not elsewhere classified: Secondary | ICD-10-CM | POA: Diagnosis not present

## 2022-02-26 NOTE — Therapy (Addendum)
OUTPATIENT PHYSICAL THERAPY LOWER EXTREMITY TREATMENT   Patient Name: [name] MRN: [id number] DOB:[birth year], 66 y.o., male Today's Date: 02/26/2022   PT End of Session - 02/26/22 0751     Visit Number 3    Number of Visits 8    Date for PT Re-Evaluation 03/20/22    Authorization Type [employer] Medicare; no copay, 20% co insurance; no dec, no VL, no auth required    Progress Note Due on Visit 10    PT Start Time [phone]   initially taken to basement gym, delayed start   PT Stop Time 0826    PT Time Calculation (min) 43 min    Activity Tolerance Patient tolerated treatment well;No increased pain;Patient limited by fatigue    Behavior During Therapy [facility] for tasks assessed/performed               Past Medical History:  Diagnosis Date   Acute ST elevation myocardial infarction (STEMI) due to occlusion of mid portion of left anterior descending (LAD) coronary artery ([place]) 54/00/8676   Acute systolic heart failure (HCC)    AICD (automatic cardioverter/defibrillator) present    Arthritis    CHF (congestive heart failure) (HCC)    CKD (chronic kidney disease) stage 4, GFR 15-29 ml/min (HCC)    Diabetes mellitus, type II ([place])    Dyspnea    with exertionm periodically   Heart attack ([place]) 03/15/2019   anterior MI    History of kidney stones    Hyperparathyroidism ([place])    Hypertension 2002   Ischemic cardiomyopathy    Left knee pain    Syncope 04/04/2019   after blood draw   Vitamin D deficiency    Past Surgical History:  Procedure Laterality Date   AMPUTATION TOE Left 1991   2 digit of left foot   BIOPSY  02/26/2021   Procedure: BIOPSY;  Surgeon: Rogene Houston, MD;  [facility];  Service: Endoscopy;;   CHOLECYSTECTOMY     COLONOSCOPY     COLONOSCOPY WITH PROPOFOL N/A 02/26/2021   Procedure: COLONOSCOPY WITH PROPOFOL;  Surgeon: Rogene Houston, MD;  [facility];  Service: Endoscopy;  Laterality: N/A;   CORONARY/GRAFT ACUTE MI  REVASCULARIZATION N/A 03/12/2019   Procedure: Coronary/Graft Acute MI Revascularization;  Surgeon: Belva Crome, MD;  Location: [facility];  Service: Cardiovascular;  Laterality: N/A;   ESOPHAGOGASTRODUODENOSCOPY (EGD) WITH PROPOFOL N/A 02/26/2021   Procedure: ESOPHAGOGASTRODUODENOSCOPY (EGD) WITH PROPOFOL;  Surgeon: Rogene Houston, MD;  [facility];  Service: Endoscopy;  Laterality: N/A;   ICD IMPLANT N/A 08/24/2019   Procedure: ICD IMPLANT;  Surgeon: Evans Lance, MD;  Location: [place] [facility];  Service: Cardiovascular;  Laterality: N/A;   IR URETERAL STENT LEFT NEW ACCESS W/O SEP NEPHROSTOMY CATH  09/26/2019   LEFT HEART CATH AND CORONARY ANGIOGRAPHY N/A 03/12/2019   Procedure: LEFT HEART CATH AND CORONARY ANGIOGRAPHY;  Surgeon: Belva Crome, MD;  Location: [place] [facility];  Service: Cardiovascular;  Laterality: N/A;   LITHOTRIPSY Right    NEPHROLITHOTOMY Left 09/26/2019   Procedure: LEFT NEPHROLITHOTOMY PERCUTANEOUS;  Surgeon: Irine Seal, MD;  Location: [facility];  Service: Urology;  Laterality: Left;   TOTAL KNEE ARTHROPLASTY Left 02/02/2022   Procedure: TOTAL KNEE ARTHROPLASTY;  Surgeon: Vickey Huger, MD;  Location: [facility];  Service: Orthopedics;  Laterality: Left;   Patient Active Problem List   Diagnosis Date Noted   S/P total knee replacement 02/02/2022   Encounter for  general adult medical examination with abnormal findings 09/17/2021   Other hyperparathyroidism ([place]) 06/13/2021   Vitamin D deficiency 06/13/2021   Nonintractable episodic headache 04/23/2021   Primary osteoarthritis of both knees 04/23/2021   Anemia due to chronic blood loss 03/05/2021   Gastric ulcer 03/05/2021   Type 2 diabetes with nephropathy ([place])    LUQ pain    Prediabetes 02/22/2021   Left renal stone 09/26/2019   CAD in native artery    Ischemic cardiomyopathy 08/15/2019   Congestive heart failure ([place]) 03/21/2019   ST elevation myocardial infarction involving left anterior  descending (LAD) coronary artery (HCC)    Chronic kidney disease, stage IV (severe) ([place]) 06/11/2016   Elevated PSA 06/11/2016   Hyperlipidemia 06/11/2016   Essential hypertension 05/28/2016     PCP: Ihor Dow MD  REFERRING PROVIDER: Vickey Huger, MD Next apt 03/19/22  REFERRING DIAG: PT eval/tx for s/p lt TKA per Vickey Huger, MD  THERAPY DIAG:  Primary osteoarthritis of left knee  Difficulty in walking, not elsewhere classified  Rationale for Evaluation and Treatment Rehabilitation  ONSET DATE: 02/05/2022  SUBJECTIVE:   SUBJECTIVE STATEMENT: Pt taken downstairs to gym initially this session, delayed start time of session.  No reports of pain today, just some stiffness due to cold weather.    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 Yes: NPRS scale: 0/10 Pain location: left knee Pain description: sore, aching Aggravating factors: movement Relieving factors: rest, medicine  PRECAUTIONS: None  WEIGHT BEARING RESTRICTIONS: No  FALLS:  Has patient fallen in last 6 months? No  LIVING ENVIRONMENT: Lives with: lives with their family Lives in: House/apartment Stairs: Yes: External: 2 steps; none Has following equipment at home: Environmental consultant - 2 wheeled, Wheelchair (manual), and bed side commode  OCCUPATION:   PLOF: Independent  PATIENT GOALS: walk   OBJECTIVE:   DIAGNOSTIC FINDINGS: none  PATIENT SURVEYS:  FOTO 44  COGNITION: Overall cognitive status: Within functional limits for tasks assessed     SENSATION: WFL  EDEMA:  Normal for this time s/p     PALPATION: Tenderness normal for this time s/p  LOWER EXTREMITY ROM:  Active ROM Right eval Left eval Left 02/23/22 Left 02/26/22  Hip flexion      Hip extension      Hip abduction      Hip adduction      Hip internal rotation      Hip external rotation      Knee flexion  86 96 98  Knee extension  -12 -15 11  Ankle  dorsiflexion      Ankle plantarflexion      Ankle inversion      Ankle eversion       (Blank rows = not tested)  LOWER EXTREMITY MMT:  MMT Right eval Left eval  Hip flexion    Hip extension    Hip abduction    Hip adduction    Hip internal rotation    Hip external rotation    Knee flexion  3- (sitting)  Knee extension  Poor to fair quad set  Ankle dorsiflexion    Ankle plantarflexion    Ankle inversion    Ankle eversion     (Blank rows = not tested)   FUNCTIONAL TESTS:  5 times sit to stand: 02/26/22: 5STS 14.98" initially required HHA for 3 reps then able to complete without HHA for last 2, decreased eccentric contro  GAIT: Distance walked: 10  ft Assistive device utilized: Environmental consultant - 2 wheeled Level of assistance: CGA Comments: decreased heel strike and toe off; extension lag left knee; antalgic and step to gait pattern   TODAY'S TREATMENT                                                                                                                             02/26/22: Sitting:   LAQ: 10x 5" Heel slides with AAROM at end range 10x 10" holds  Supine: SAQ 10x 5" Quad sets 10x 5" Bridge 10x 5" SLR with quad set prior raise AROM 11-98 degrees  Standing: Heel raises 10x 5" holds Toe raises 10x5" holds  5STS 14.98" initially required HHA for 3 reps then able to complete without HHA for last 2, decreased eccentric control Reviewed goals, educated importance of HEP compliance, increased to complete 100 reps with quad sets  02/23/22 *shortened treatment time patient has to leave early Supine: Quad sets 5" hold x 10 Heel slides x 10 SLR with lag 2 x 10  Sitting LAQ's 2 x 10 Hip flexion 2 x 10  Gait training with RW       Physical therapy evaluation and HEP instruction    PATIENT EDUCATION:  Education details: Patient educated on exam findings, POC, scope of PT, HEP. Person educated: Patient Education method: Explanation, Demonstration, and  Handouts Education comprehension: verbalized understanding, returned demonstration, verbal cues required, and tactile cues required   HOME EXERCISE PROGRAM: Access Code: [email or web address] URL: [email or web address] Date: 02/20/2022 Prepared by: [facility]  Exercises - Supine Ankle Pumps  - 3 x daily - 7 x weekly - 1 sets - 10 reps - Supine Quad Set  - 3 x daily - 7 x weekly - 1 sets - 10 reps - Supine Straight Leg Hip Adduction and Quad Set with Ball  - 3 x daily - 7 x weekly - 1 sets - 10 reps - Supine Heel Slides  - 3 x daily - 7 x weekly - 1 sets - 10 reps - Supine Active Straight Leg Raise  - 3 x daily - 7 x weekly - 1 sets - 10 reps - Supine Hip Abduction  - 3 x daily - 7 x weekly - 1 sets - 10 reps - Seated Knee Flexion Extension AROM   - 3 x daily - 7 x weekly - 1 sets - 10 reps - Seated Knee Flexion AAROM  - 3 x daily - 7 x weekly - 1 sets - 10 reps - Seated Long Arc Quad  - 3 x daily - 7 x weekly - 1 sets - 10 reps - Supine Knee Extension Stretch on Towel Roll  - 1 x daily - 7 x weekly - 1 sets - 1-5 min hold  ASSESSMENT:  CLINICAL IMPRESSION: Pt initially taken downstairs to basement, delayed start this session.  Session focus with knee mobility and quad strengthening.  Pt presents with poor quadricep activation, required cueing for  increased hold time.  Encouraged to complete 100 quad sets a day and hold 5-10" to promote strengthening.  Reviewed goals and educated importance of HEP compliance for maximal benefits, verbalized understanding.  5STS complete in 14.98", initially required HHA from chair for first 3 reps then able to complete without HHA, presents with decreased eccentric control.  AROM 11-98 degrees.  No reports of pain through session.  Eval:Patient is a 66 y.o. male who was seen today for physical therapy evaluation and treatment for PT eval/tx for s/p lt TKA per Vickey Huger, MD . Patient arrived vomiting as he took his pain medication on an empty stomach.   Patient given emesis bag and filled it; given another.  Shortened evaluation time due to sickness.  Patient presents with limited left knee mobility and decreased left leg strength; antalgic gait, swelling and pain that all negatively impact his ability to perform household and self care tasks and take care of his mother who he lives with and cares for.  Patient will benefit from skilled therapy services to address limitations and promote optimal function.    OBJECTIVE IMPAIRMENTS: Abnormal gait, decreased activity tolerance, decreased balance, decreased endurance, decreased mobility, difficulty walking, decreased ROM, hypomobility, increased edema, increased fascial restrictions, impaired perceived functional ability, increased muscle spasms, impaired flexibility, and pain.   ACTIVITY LIMITATIONS: carrying, lifting, bending, sitting, standing, squatting, sleeping, stairs, transfers, bed mobility, locomotion level, and caring for others  PARTICIPATION LIMITATIONS: meal prep, cleaning, laundry, driving, shopping, and community activity   REHAB POTENTIAL: Good  CLINICAL DECISION MAKING: Stable/uncomplicated  EVALUATION COMPLEXITY: Low   GOALS: Goals reviewed with patient? No  SHORT TERM GOALS: Target date: 03/06/2022  Patient will be independent with initial HEP  Baseline: Goal status: IN PROGRESS  2.  Patient will increase left knee AROM in supine to -8 to 100 to improve ability to navigate steps to enter home. Baseline: see above Goal status: IN PROGRESS    LONG TERM GOALS: Target date: 03/20/2022   Patient will be independent in self management strategies to improve quality of life and functional outcomes.  Baseline:  Goal status: IN PROGRESS  2.  Patient will increase left leg MMTs to 5/5 without pain to promote return to ambulation community distances with minimal deviation.  Baseline: see above Goal status: IN PROGRESS  3.  Patient will improve FOTO score to predicted  value  Baseline: 44 Goal status: IN PROGRESS  4.  Patient will increase left knee AROM in supine to -2 to 120 to perform all household tasks and care for his mother without issue. Baseline:  Goal status: IN PROGRESS  5.  Patient will perform 5 x sit to stand in 20 sec or less to demonstrate good functional mobility and increased lower extremity strength.  Baseline: 02/26/22: 5STS 14.98" initially required HHA for 3 reps then able to complete without HHA for last 2, decreased eccentric control Goal status: IN PROGRESS   PLAN:  PT FREQUENCY: 2x/week  PT DURATION: 4 weeks  PLANNED INTERVENTIONS: Therapeutic exercises, Therapeutic activity, Neuromuscular re-education, Balance training, Gait training, Patient/Family education, Joint manipulation, Joint mobilization, Stair training, Orthotic/Fit training, DME instructions, Aquatic Therapy, Dry Needling, Electrical stimulation, Spinal manipulation, Spinal mobilization, Cryotherapy, Moist heat, Compression bandaging, scar mobilization, Splintting, Taping, Traction, Ultrasound, Ionotophoresis 4mg /ml Dexamethasone, and Manual therapy  PLAN FOR NEXT SESSION: Focus on quad strengthening and knee mobility.  Add TKE next session.  Ihor Austin, LPTA/CLT; CBIS [phone]  9:00 AM, 02/26/22

## 2022-02-27 ENCOUNTER — Ambulatory Visit (INDEPENDENT_AMBULATORY_CARE_PROVIDER_SITE_OTHER): Payer: Medicare Other

## 2022-02-27 DIAGNOSIS — I255 Ischemic cardiomyopathy: Secondary | ICD-10-CM

## 2022-02-27 LAB — CUP PACEART REMOTE DEVICE CHECK
Battery Remaining Longevity: 156 mo
Battery Remaining Percentage: 100 %
Brady Statistic RV Percent Paced: 0 %
Date Time Interrogation Session: 20231027044000
HighPow Impedance: 78 Ohm
Implantable Lead Connection Status: 753985
Implantable Lead Implant Date: 20210422
Implantable Lead Location: 753860
Implantable Lead Model: 138
Implantable Lead Serial Number: 302700
Implantable Pulse Generator Implant Date: 20210422
Lead Channel Impedance Value: 408 Ohm
Lead Channel Setting Pacing Amplitude: 2.5 V
Lead Channel Setting Pacing Pulse Width: 0.4 ms
Lead Channel Setting Sensing Sensitivity: 0.5 mV
Pulse Gen Serial Number: 209495
Zone Setting Status: 755011

## 2022-02-28 ENCOUNTER — Other Ambulatory Visit: Payer: Self-pay | Admitting: Internal Medicine

## 2022-03-03 ENCOUNTER — Encounter (HOSPITAL_COMMUNITY): Payer: Self-pay

## 2022-03-03 ENCOUNTER — Ambulatory Visit (HOSPITAL_COMMUNITY): Payer: Medicare Other

## 2022-03-03 DIAGNOSIS — M1712 Unilateral primary osteoarthritis, left knee: Secondary | ICD-10-CM | POA: Diagnosis not present

## 2022-03-03 DIAGNOSIS — R262 Difficulty in walking, not elsewhere classified: Secondary | ICD-10-CM | POA: Diagnosis not present

## 2022-03-03 NOTE — Therapy (Signed)
OUTPATIENT PHYSICAL THERAPY LOWER EXTREMITY TREATMENT   Patient Name: [name] MRN: [id number] DOB:1956/02/20, 66 y.o., male Today's Date: 03/03/2022   PT End of Session - 03/03/22 1432     Visit Number 4    Number of Visits 8    Date for PT Re-Evaluation 03/20/22    Authorization Type UHC Medicare; no copay, 20% co insurance; no dec, no VL, no auth required    Progress Note Due on Visit 10    PT Start Time 1347    PT Stop Time 1429    PT Time Calculation (min) 42 min    Activity Tolerance Patient tolerated treatment well;No increased pain;Patient limited by fatigue    Behavior During Therapy [facility] for tasks assessed/performed                Past Medical History:  Diagnosis Date   Acute ST elevation myocardial infarction (STEMI) due to occlusion of mid portion of left anterior descending (LAD) coronary artery ([place]) 68/34/1962   Acute systolic heart failure (HCC)    AICD (automatic cardioverter/defibrillator) present    Arthritis    CHF (congestive heart failure) (HCC)    CKD (chronic kidney disease) stage 4, GFR 15-29 ml/min (HCC)    Diabetes mellitus, type II ([place])    Dyspnea    with exertionm periodically   Heart attack ([place]) 03/15/2019   anterior MI    History of kidney stones    Hyperparathyroidism ([place])    Hypertension 2002   Ischemic cardiomyopathy    Left knee pain    Syncope 04/04/2019   after blood draw   Vitamin D deficiency    Past Surgical History:  Procedure Laterality Date   AMPUTATION TOE Left 1991   2 digit of left foot   BIOPSY  02/26/2021   Procedure: BIOPSY;  Surgeon: Rogene Houston, MD;  [facility];  Service: Endoscopy;;   CHOLECYSTECTOMY     COLONOSCOPY     COLONOSCOPY WITH PROPOFOL N/A 02/26/2021   Procedure: COLONOSCOPY WITH PROPOFOL;  Surgeon: Rogene Houston, MD;  [facility];  Service: Endoscopy;  Laterality: N/A;   CORONARY/GRAFT ACUTE MI REVASCULARIZATION N/A 03/12/2019   Procedure:  Coronary/Graft Acute MI Revascularization;  Surgeon: Belva Crome, MD;  Location: [place] [facility];  Service: Cardiovascular;  Laterality: N/A;   ESOPHAGOGASTRODUODENOSCOPY (EGD) WITH PROPOFOL N/A 02/26/2021   Procedure: ESOPHAGOGASTRODUODENOSCOPY (EGD) WITH PROPOFOL;  Surgeon: Rogene Houston, MD;  [facility];  Service: Endoscopy;  Laterality: N/A;   ICD IMPLANT N/A 08/24/2019   Procedure: ICD IMPLANT;  Surgeon: Evans Lance, MD;  Location: [place] [facility];  Service: Cardiovascular;  Laterality: N/A;   IR URETERAL STENT LEFT NEW ACCESS W/O SEP NEPHROSTOMY CATH  09/26/2019   LEFT HEART CATH AND CORONARY ANGIOGRAPHY N/A 03/12/2019   Procedure: LEFT HEART CATH AND CORONARY ANGIOGRAPHY;  Surgeon: Belva Crome, MD;  Location: [place] [facility];  Service: Cardiovascular;  Laterality: N/A;   LITHOTRIPSY Right    NEPHROLITHOTOMY Left 09/26/2019   Procedure: LEFT NEPHROLITHOTOMY PERCUTANEOUS;  Surgeon: Irine Seal, MD;  Location: [facility];  Service: Urology;  Laterality: Left;   TOTAL KNEE ARTHROPLASTY Left 02/02/2022   Procedure: TOTAL KNEE ARTHROPLASTY;  Surgeon: Vickey Huger, MD;  Location: [facility];  Service: Orthopedics;  Laterality: Left;   Patient Active Problem List   Diagnosis Date Noted   S/P total knee replacement 02/02/2022   Encounter for general adult medical examination with abnormal findings  09/17/2021   Other hyperparathyroidism ([place]) 06/13/2021   Vitamin D deficiency 06/13/2021   Nonintractable episodic headache 04/23/2021   Primary osteoarthritis of both knees 04/23/2021   Anemia due to chronic blood loss 03/05/2021   Gastric ulcer 03/05/2021   Type 2 diabetes with nephropathy ([place])    LUQ pain    Prediabetes 02/22/2021   Left renal stone 09/26/2019   CAD in native artery    Ischemic cardiomyopathy 08/15/2019   Congestive heart failure ([place]) 03/21/2019   ST elevation myocardial infarction involving left anterior descending (LAD) coronary artery (HCC)     Chronic kidney disease, stage IV (severe) ([place]) 06/11/2016   Elevated PSA 06/11/2016   Hyperlipidemia 06/11/2016   Essential hypertension 05/28/2016     PCP: Ihor Dow MD  REFERRING PROVIDER: Vickey Huger, MD Next apt 03/19/22  REFERRING DIAG: PT eval/tx for s/p lt TKA per Vickey Huger, MD  THERAPY DIAG:  Primary osteoarthritis of left knee  Difficulty in walking, not elsewhere classified  Rationale for Evaluation and Treatment Rehabilitation  ONSET DATE: 02/05/2022  SUBJECTIVE:   SUBJECTIVE STATEMENT: Pt reports he has walking some without AD at home with no reports of falls.  Exercising daily.  No reports of pain today.  Arrived in wheelchair.   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 Yes: NPRS scale: 0/10 Pain location: left knee Pain description: sore, aching Aggravating factors: movement Relieving factors: rest, medicine  PRECAUTIONS: None  WEIGHT BEARING RESTRICTIONS: No  FALLS:  Has patient fallen in last 6 months? No  LIVING ENVIRONMENT: Lives with: lives with their family Lives in: House/apartment Stairs: Yes: External: 2 steps; none Has following equipment at home: Environmental consultant - 2 wheeled, Wheelchair (manual), and bed side commode  OCCUPATION:   PLOF: Independent  PATIENT GOALS: walk   OBJECTIVE:   DIAGNOSTIC FINDINGS: none  PATIENT SURVEYS:  FOTO 44  COGNITION: Overall cognitive status: Within functional limits for tasks assessed     SENSATION: WFL  EDEMA:  Normal for this time s/p     PALPATION: Tenderness normal for this time s/p  LOWER EXTREMITY ROM:  Active ROM Right eval Left eval Left 02/23/22 Left 02/26/22  Hip flexion      Hip extension      Hip abduction      Hip adduction      Hip internal rotation      Hip external rotation      Knee flexion  86 96 98  Knee extension  -12 -15 11  Ankle dorsiflexion      Ankle plantarflexion      Ankle  inversion      Ankle eversion       (Blank rows = not tested)  LOWER EXTREMITY MMT:  MMT Right eval Left eval  Hip flexion    Hip extension    Hip abduction    Hip adduction    Hip internal rotation    Hip external rotation    Knee flexion  3- (sitting)  Knee extension  Poor to fair quad set  Ankle dorsiflexion    Ankle plantarflexion    Ankle inversion    Ankle eversion     (Blank rows = not tested)   FUNCTIONAL TESTS:  5 times sit to stand: 02/26/22: 5STS 14.98" initially required HHA for 3 reps then able to complete without HHA for last 2, decreased eccentric contro  GAIT: Distance walked: 10 ft Assistive device utilized: Environmental consultant -  2 wheeled Level of assistance: CGA Comments: decreased heel strike and toe off; extension lag left knee; antalgic and step to gait pattern   TODAY'S TREATMENT                                                                                                                             03/03/22 Standing:  Rockerboard lateral with cueing to stand tall to reduce leaning. Heel raises 10x 5" TKE with RTB 10x 5"  Sitting: STS no HHA LAQ 10x5"  Supine: quad sets 10x 5" SAQ 10x 5" Bridge 10x 5" SLR with quad set prior raise AROM 10 degrees-- 103 AAROM  02/26/22: Sitting:   LAQ: 10x 5" Heel slides with AAROM at end range 10x 10" holds  Supine: SAQ 10x 5" Quad sets 10x 5" Bridge 10x 5" SLR with quad set prior raise AROM 11-98 degrees  Standing: Heel raises 10x 5" holds Toe raises 10x5" holds  5STS 14.98" initially required HHA for 3 reps then able to complete without HHA for last 2, decreased eccentric control Reviewed goals, educated importance of HEP compliance, increased to complete 100 reps with quad sets  02/23/22 *shortened treatment time patient has to leave early Supine: Quad sets 5" hold x 10 Heel slides x 10 SLR with lag 2 x 10  Sitting LAQ's 2 x 10 Hip flexion 2 x 10  Gait training with  RW       Physical therapy evaluation and HEP instruction    PATIENT EDUCATION:  Education details: Patient educated on exam findings, POC, scope of PT, HEP. Person educated: Patient Education method: Explanation, Demonstration, and Handouts Education comprehension: verbalized understanding, returned demonstration, verbal cues required, and tactile cues required   HOME EXERCISE PROGRAM: Access Code: [email or web address] URL: [email or web address] Date: 02/20/2022 Prepared by: [facility] - [facility]  Exercises - Supine Ankle Pumps  - 3 x daily - 7 x weekly - 1 sets - 10 reps - Supine Quad Set  - 3 x daily - 7 x weekly - 1 sets - 10 reps - Supine Straight Leg Hip Adduction and Quad Set with Ball  - 3 x daily - 7 x weekly - 1 sets - 10 reps - Supine Heel Slides  - 3 x daily - 7 x weekly - 1 sets - 10 reps - Supine Active Straight Leg Raise  - 3 x daily - 7 x weekly - 1 sets - 10 reps - Supine Hip Abduction  - 3 x daily - 7 x weekly - 1 sets - 10 reps - Seated Knee Flexion Extension AROM   - 3 x daily - 7 x weekly - 1 sets - 10 reps - Seated Knee Flexion AAROM  - 3 x daily - 7 x weekly - 1 sets - 10 reps - Seated Long Arc Quad  - 3 x daily - 7 x weekly - 1 sets - 10 reps - Supine Knee Extension Stretch on Towel Roll  - 1  x daily - 7 x weekly - 1 sets - 1-5 min hold  ASSESSMENT:  CLINICAL IMPRESSION: Pt arrived in [facility], ambulated to room with WC with safe slow labored gait.  Session focus with knee mobility and LE strengthening.  Pt continues to present with extension lag and weak quadriceps.  Added rockerboard to improve weight bearing Lt LE and standing TKE to improve extension lag.  Improved AROM 10-103 degrees with AAROM end range.  Encouraged pt to continue with 100 quad sets a day.  No reports of pain through session, was limited by fatigue.  OBJECTIVE IMPAIRMENTS: Abnormal gait, decreased activity tolerance, decreased balance, decreased endurance, decreased mobility, difficulty walking,  decreased ROM, hypomobility, increased edema, increased fascial restrictions, impaired perceived functional ability, increased muscle spasms, impaired flexibility, and pain.   ACTIVITY LIMITATIONS: carrying, lifting, bending, sitting, standing, squatting, sleeping, stairs, transfers, bed mobility, locomotion level, and caring for others  PARTICIPATION LIMITATIONS: meal prep, cleaning, laundry, driving, shopping, and community activity   REHAB POTENTIAL: Good  CLINICAL DECISION MAKING: Stable/uncomplicated  EVALUATION COMPLEXITY: Low   GOALS: Goals reviewed with patient? No  SHORT TERM GOALS: Target date: 03/06/2022  Patient will be independent with initial HEP  Baseline: Goal status: IN PROGRESS  2.  Patient will increase left knee AROM in supine to -8 to 100 to improve ability to navigate steps to enter home. Baseline: see above Goal status: IN PROGRESS    LONG TERM GOALS: Target date: 03/20/2022   Patient will be independent in self management strategies to improve quality of life and functional outcomes.  Baseline:  Goal status: IN PROGRESS  2.  Patient will increase left leg MMTs to 5/5 without pain to promote return to ambulation community distances with minimal deviation.  Baseline: see above Goal status: IN PROGRESS  3.  Patient will improve FOTO score to predicted value  Baseline: 44 Goal status: IN PROGRESS  4.  Patient will increase left knee AROM in supine to -2 to 120 to perform all household tasks and care for his mother without issue. Baseline:  Goal status: IN PROGRESS  5.  Patient will perform 5 x sit to stand in 20 sec or less to demonstrate good functional mobility and increased lower extremity strength.  Baseline: 02/26/22: 5STS 14.98" initially required HHA for 3 reps then able to complete without HHA for last 2, decreased eccentric control Goal status: IN PROGRESS   PLAN:  PT FREQUENCY: 2x/week  PT DURATION: 4 weeks  PLANNED  INTERVENTIONS: Therapeutic exercises, Therapeutic activity, Neuromuscular re-education, Balance training, Gait training, Patient/Family education, Joint manipulation, Joint mobilization, Stair training, Orthotic/Fit training, DME instructions, Aquatic Therapy, Dry Needling, Electrical stimulation, Spinal manipulation, Spinal mobilization, Cryotherapy, Moist heat, Compression bandaging, scar mobilization, Splintting, Taping, Traction, Ultrasound, Ionotophoresis 4mg /ml Dexamethasone, and Manual therapy  PLAN FOR NEXT SESSION: Focus on quad strengthening and knee mobility. Gait training with LRAD as appropriate. Begin scar tissue massage on incision when fully healed and steristeps fallen off  Ihor Austin, LPTA/CLT; CBIS [phone]  3:47 PM, 03/03/22

## 2022-03-04 NOTE — Progress Notes (Signed)
Remote ICD transmission.   

## 2022-03-05 ENCOUNTER — Ambulatory Visit (HOSPITAL_COMMUNITY): Payer: Medicare Other | Attending: Orthopedic Surgery

## 2022-03-05 ENCOUNTER — Encounter (HOSPITAL_COMMUNITY): Payer: Self-pay

## 2022-03-05 DIAGNOSIS — R262 Difficulty in walking, not elsewhere classified: Secondary | ICD-10-CM | POA: Insufficient documentation

## 2022-03-05 DIAGNOSIS — M1712 Unilateral primary osteoarthritis, left knee: Secondary | ICD-10-CM | POA: Insufficient documentation

## 2022-03-05 NOTE — Therapy (Signed)
OUTPATIENT PHYSICAL THERAPY LOWER EXTREMITY TREATMENT   Patient Name: [name] MRN: [id number] DOB:02-23-1956, 66 y.o., male Today's Date: 03/05/2022   PT End of Session - 03/05/22 1215     Visit Number 5    Number of Visits 8    Date for PT Re-Evaluation 03/20/22    Authorization Type UHC Medicare; no copay, 20% co insurance; no dec, no VL, no auth required    Progress Note Due on Visit 10    PT Start Time 1120    PT Stop Time 1203    PT Time Calculation (min) 43 min    Activity Tolerance Patient tolerated treatment well;No increased pain;Patient limited by fatigue    Behavior During Therapy [facility] for tasks assessed/performed                 Past Medical History:  Diagnosis Date   Acute ST elevation myocardial infarction (STEMI) due to occlusion of mid portion of left anterior descending (LAD) coronary artery ([place]) 18/84/1660   Acute systolic heart failure (HCC)    AICD (automatic cardioverter/defibrillator) present    Arthritis    CHF (congestive heart failure) (HCC)    CKD (chronic kidney disease) stage 4, GFR 15-29 ml/min (HCC)    Diabetes mellitus, type II ([place])    Dyspnea    with exertionm periodically   Heart attack ([place]) 03/15/2019   anterior MI    History of kidney stones    Hyperparathyroidism ([place])    Hypertension 2002   Ischemic cardiomyopathy    Left knee pain    Syncope 04/04/2019   after blood draw   Vitamin D deficiency    Past Surgical History:  Procedure Laterality Date   AMPUTATION TOE Left 1991   2 digit of left foot   BIOPSY  02/26/2021   Procedure: BIOPSY;  Surgeon: Rogene Houston, MD;  [facility];  Service: Endoscopy;;   CHOLECYSTECTOMY     COLONOSCOPY     COLONOSCOPY WITH PROPOFOL N/A 02/26/2021   Procedure: COLONOSCOPY WITH PROPOFOL;  Surgeon: Rogene Houston, MD;  [facility];  Service: Endoscopy;  Laterality: N/A;   CORONARY/GRAFT ACUTE MI REVASCULARIZATION N/A 03/12/2019   Procedure:  Coronary/Graft Acute MI Revascularization;  Surgeon: Belva Crome, MD;  Location: [place] [facility];  Service: Cardiovascular;  Laterality: N/A;   ESOPHAGOGASTRODUODENOSCOPY (EGD) WITH PROPOFOL N/A 02/26/2021   Procedure: ESOPHAGOGASTRODUODENOSCOPY (EGD) WITH PROPOFOL;  Surgeon: Rogene Houston, MD;  [facility];  Service: Endoscopy;  Laterality: N/A;   ICD IMPLANT N/A 08/24/2019   Procedure: ICD IMPLANT;  Surgeon: Evans Lance, MD;  Location: [place] [facility];  Service: Cardiovascular;  Laterality: N/A;   IR URETERAL STENT LEFT NEW ACCESS W/O SEP NEPHROSTOMY CATH  09/26/2019   LEFT HEART CATH AND CORONARY ANGIOGRAPHY N/A 03/12/2019   Procedure: LEFT HEART CATH AND CORONARY ANGIOGRAPHY;  Surgeon: Belva Crome, MD;  Location: [place] [facility];  Service: Cardiovascular;  Laterality: N/A;   LITHOTRIPSY Right    NEPHROLITHOTOMY Left 09/26/2019   Procedure: LEFT NEPHROLITHOTOMY PERCUTANEOUS;  Surgeon: Irine Seal, MD;  Location: [facility];  Service: Urology;  Laterality: Left;   TOTAL KNEE ARTHROPLASTY Left 02/02/2022   Procedure: TOTAL KNEE ARTHROPLASTY;  Surgeon: Vickey Huger, MD;  Location: [facility];  Service: Orthopedics;  Laterality: Left;   Patient Active Problem List   Diagnosis Date Noted   S/P total knee replacement 02/02/2022   Encounter for general adult medical examination with abnormal  findings 09/17/2021   Other hyperparathyroidism ([place]) 06/13/2021   Vitamin D deficiency 06/13/2021   Nonintractable episodic headache 04/23/2021   Primary osteoarthritis of both knees 04/23/2021   Anemia due to chronic blood loss 03/05/2021   Gastric ulcer 03/05/2021   Type 2 diabetes with nephropathy ([place])    LUQ pain    Prediabetes 02/22/2021   Left renal stone 09/26/2019   CAD in native artery    Ischemic cardiomyopathy 08/15/2019   Congestive heart failure ([place]) 03/21/2019   ST elevation myocardial infarction involving left anterior descending (LAD) coronary artery (HCC)     Chronic kidney disease, stage IV (severe) ([place]) 06/11/2016   Elevated PSA 06/11/2016   Hyperlipidemia 06/11/2016   Essential hypertension 05/28/2016     PCP: Ihor Dow MD  REFERRING PROVIDER: Vickey Huger, MD Next apt 03/19/22  REFERRING DIAG: PT eval/tx for s/p lt TKA per Vickey Huger, MD  THERAPY DIAG:  Primary osteoarthritis of left knee  Rationale for Evaluation and Treatment Rehabilitation  ONSET DATE: 02/05/2022  SUBJECTIVE:   SUBJECTIVE STATEMENT: Pt stated increased pain today, feels the cold weather is related.  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 Yes: NPRS scale: 5.5/10 Pain location: left knee Pain description: sore, aching Aggravating factors: movement Relieving factors: rest, medicine  PRECAUTIONS: None  WEIGHT BEARING RESTRICTIONS: No  FALLS:  Has patient fallen in last 6 months? No  LIVING ENVIRONMENT: Lives with: lives with their family Lives in: House/apartment Stairs: Yes: External: 2 steps; none Has following equipment at home: Environmental consultant - 2 wheeled, Wheelchair (manual), and bed side commode  OCCUPATION:   PLOF: Independent  PATIENT GOALS: walk   OBJECTIVE:   DIAGNOSTIC FINDINGS: none  PATIENT SURVEYS:  FOTO 44  COGNITION: Overall cognitive status: Within functional limits for tasks assessed     SENSATION: WFL  EDEMA:  Normal for this time s/p     PALPATION: Tenderness normal for this time s/p  LOWER EXTREMITY ROM:  Active ROM Right eval Left eval Left 02/23/22 Left 02/26/22 Left 03/05/02  Hip flexion       Hip extension       Hip abduction       Hip adduction       Hip internal rotation       Hip external rotation       Knee flexion  86 96 98 103  Knee extension  -12 -15 11 8   Ankle dorsiflexion       Ankle plantarflexion       Ankle inversion       Ankle eversion        (Blank rows = not tested)  LOWER EXTREMITY MMT:  MMT  Right eval Left eval  Hip flexion    Hip extension    Hip abduction    Hip adduction    Hip internal rotation    Hip external rotation    Knee flexion  3- (sitting)  Knee extension  Poor to fair quad set  Ankle dorsiflexion    Ankle plantarflexion    Ankle inversion    Ankle eversion     (Blank rows = not tested)   FUNCTIONAL TESTS:  5 times sit to stand: 02/26/22: 5STS 14.98" initially required HHA for 3 reps then able to complete without HHA for last 2, decreased eccentric contro  GAIT: Distance walked: 10 ft Assistive device utilized: Walker - 2 wheeled Level of assistance: CGA Comments: decreased heel strike  and toe off; extension lag left knee; antalgic and step to gait pattern   TODAY'S TREATMENT                                                                                                                             03/05/22 Prone knee hang x 1 min Seated: LAQ Standing heel raise 15x 5"  Toe raises  TKE with GTB 10x 5"  Squats with HHA front of chair for mechanics STS no HHA 10x  Supine: manual retrograde massage for edema control Scar tissue massage superior incision when steri strips have fallen off Heel slides AROM 8-103 degrees  03/03/22 Standing:  Rockerboard lateral with cueing to stand tall to reduce leaning. Heel raises 10x 5" TKE with RTB 10x 5"  Sitting: STS no HHA LAQ 10x5"  Supine: quad sets 10x 5" SAQ 10x 5" Bridge 10x 5" SLR with quad set prior raise AROM 10 degrees-- 103 AAROM  02/26/22: Sitting:   LAQ: 10x 5" Heel slides with AAROM at end range 10x 10" holds  Supine: SAQ 10x 5" Quad sets 10x 5" Bridge 10x 5" SLR with quad set prior raise AROM 11-98 degrees  Standing: Heel raises 10x 5" holds Toe raises 10x5" holds  5STS 14.98" initially required HHA for 3 reps then able to complete without HHA for last 2, decreased eccentric control Reviewed goals, educated importance of HEP compliance, increased to complete 100 reps  with quad sets  02/23/22 *shortened treatment time patient has to leave early Supine: Quad sets 5" hold x 10 Heel slides x 10 SLR with lag 2 x 10  Sitting LAQ's 2 x 10 Hip flexion 2 x 10  Gait training with RW       Physical therapy evaluation and HEP instruction    PATIENT EDUCATION:  Education details: Patient educated on exam findings, POC, scope of PT, HEP. Person educated: Patient Education method: Explanation, Demonstration, and Handouts Education comprehension: verbalized understanding, returned demonstration, verbal cues required, and tactile cues required   HOME EXERCISE PROGRAM: Access Code: [email or web address] URL: [email or web address] Date: 02/20/2022 Prepared by: [facility] 03/05/22: prone knee hang, heel and toe raises Exercises - Supine Ankle Pumps  - 3 x daily - 7 x weekly - 1 sets - 10 reps - Supine Quad Set  - 3 x daily - 7 x weekly - 1 sets - 10 reps - Supine Straight Leg Hip Adduction and Quad Set with Ball  - 3 x daily - 7 x weekly - 1 sets - 10 reps - Supine Heel Slides  - 3 x daily - 7 x weekly - 1 sets - 10 reps - Supine Active Straight Leg Raise  - 3 x daily - 7 x weekly - 1 sets - 10 reps - Supine Hip Abduction  - 3 x daily - 7 x weekly - 1 sets - 10 reps - Seated Knee Flexion Extension AROM   - 3 x daily - 7 x  weekly - 1 sets - 10 reps - Seated Knee Flexion AAROM  - 3 x daily - 7 x weekly - 1 sets - 10 reps - Seated Long Arc Quad  - 3 x daily - 7 x weekly - 1 sets - 10 reps - Supine Knee Extension Stretch on Towel Roll  - 1 x daily - 7 x weekly - 1 sets - 1-5 min hold  03/05/22:  ASSESSMENT:  CLINICAL IMPRESSION: Added prone knee hang to address extension lag, pt able to tolerate 1 min prior need to change position.  Added to HEP, encouraged to increase time as able.  Pt arrived in [facility], ambulated with RW through session with improved heel to toe mechanics and increased cadence.  Continued with ROM based exercises.  Added squats for  functional strengthening.  EOS with manual retrograde massage to address edema proximal knee and reports of pain reduced.  AROM 8-103 degrees.    OBJECTIVE IMPAIRMENTS: Abnormal gait, decreased activity tolerance, decreased balance, decreased endurance, decreased mobility, difficulty walking, decreased ROM, hypomobility, increased edema, increased fascial restrictions, impaired perceived functional ability, increased muscle spasms, impaired flexibility, and pain.   ACTIVITY LIMITATIONS: carrying, lifting, bending, sitting, standing, squatting, sleeping, stairs, transfers, bed mobility, locomotion level, and caring for others  PARTICIPATION LIMITATIONS: meal prep, cleaning, laundry, driving, shopping, and community activity   REHAB POTENTIAL: Good  CLINICAL DECISION MAKING: Stable/uncomplicated  EVALUATION COMPLEXITY: Low   GOALS: Goals reviewed with patient? No  SHORT TERM GOALS: Target date: 03/06/2022  Patient will be independent with initial HEP  Baseline: Goal status: IN PROGRESS  2.  Patient will increase left knee AROM in supine to -8 to 100 to improve ability to navigate steps to enter home. Baseline: see above Goal status: IN PROGRESS    LONG TERM GOALS: Target date: 03/20/2022   Patient will be independent in self management strategies to improve quality of life and functional outcomes.  Baseline:  Goal status: IN PROGRESS  2.  Patient will increase left leg MMTs to 5/5 without pain to promote return to ambulation community distances with minimal deviation.  Baseline: see above Goal status: IN PROGRESS  3.  Patient will improve FOTO score to predicted value  Baseline: 44 Goal status: IN PROGRESS  4.  Patient will increase left knee AROM in supine to -2 to 120 to perform all household tasks and care for his mother without issue. Baseline:  Goal status: IN PROGRESS  5.  Patient will perform 5 x sit to stand in 20 sec or less to demonstrate good functional  mobility and increased lower extremity strength.  Baseline: 02/26/22: 5STS 14.98" initially required HHA for 3 reps then able to complete without HHA for last 2, decreased eccentric control Goal status: IN PROGRESS   PLAN:  PT FREQUENCY: 2x/week  PT DURATION: 4 weeks  PLANNED INTERVENTIONS: Therapeutic exercises, Therapeutic activity, Neuromuscular re-education, Balance training, Gait training, Patient/Family education, Joint manipulation, Joint mobilization, Stair training, Orthotic/Fit training, DME instructions, Aquatic Therapy, Dry Needling, Electrical stimulation, Spinal manipulation, Spinal mobilization, Cryotherapy, Moist heat, Compression bandaging, scar mobilization, Splintting, Taping, Traction, Ultrasound, Ionotophoresis 4mg /ml Dexamethasone, and Manual therapy  PLAN FOR NEXT SESSION: Focus on quad strengthening and knee mobility. Gait training with LRAD as appropriate. Add slant board, prone quad stretch and contract relax for flexion, continue quad strengthening to improve extension.    Ihor Austin, LPTA/CLT; CBIS [phone]  12:48 PM, 03/05/22

## 2022-03-10 ENCOUNTER — Encounter (HOSPITAL_COMMUNITY): Payer: Self-pay | Admitting: Physical Therapy

## 2022-03-10 ENCOUNTER — Ambulatory Visit (HOSPITAL_COMMUNITY): Payer: Medicare Other | Admitting: Physical Therapy

## 2022-03-10 DIAGNOSIS — R262 Difficulty in walking, not elsewhere classified: Secondary | ICD-10-CM

## 2022-03-10 DIAGNOSIS — M1712 Unilateral primary osteoarthritis, left knee: Secondary | ICD-10-CM

## 2022-03-10 NOTE — Therapy (Signed)
OUTPATIENT PHYSICAL THERAPY LOWER EXTREMITY TREATMENT   Patient Name: [name] MRN: [id number] DOB:August 09, 1955, 66 y.o., male Today's Date: 03/10/2022   PT End of Session - 03/10/22 1252     Visit Number 6    Number of Visits 8    Date for PT Re-Evaluation 03/20/22    Authorization Type UHC Medicare; no copay, 20% co insurance; no dec, no VL, no auth required    Progress Note Due on Visit 10    PT Start Time 1255    PT Stop Time 1339    PT Time Calculation (min) 44 min    Activity Tolerance Patient tolerated treatment well;No increased pain;Patient limited by fatigue    Behavior During Therapy [facility] for tasks assessed/performed                 Past Medical History:  Diagnosis Date   Acute ST elevation myocardial infarction (STEMI) due to occlusion of mid portion of left anterior descending (LAD) coronary artery ([place]) 83/15/1761   Acute systolic heart failure (HCC)    AICD (automatic cardioverter/defibrillator) present    Arthritis    CHF (congestive heart failure) (HCC)    CKD (chronic kidney disease) stage 4, GFR 15-29 ml/min (HCC)    Diabetes mellitus, type II ([place])    Dyspnea    with exertionm periodically   Heart attack ([place]) 03/15/2019   anterior MI    History of kidney stones    Hyperparathyroidism ([place])    Hypertension 2002   Ischemic cardiomyopathy    Left knee pain    Syncope 04/04/2019   after blood draw   Vitamin D deficiency    Past Surgical History:  Procedure Laterality Date   AMPUTATION TOE Left 1991   2 digit of left foot   BIOPSY  02/26/2021   Procedure: BIOPSY;  Surgeon: Rogene Houston, MD;  [facility];  Service: Endoscopy;;   CHOLECYSTECTOMY     COLONOSCOPY     COLONOSCOPY WITH PROPOFOL N/A 02/26/2021   Procedure: COLONOSCOPY WITH PROPOFOL;  Surgeon: Rogene Houston, MD;  [facility];  Service: Endoscopy;  Laterality: N/A;   CORONARY/GRAFT ACUTE MI REVASCULARIZATION N/A 03/12/2019   Procedure:  Coronary/Graft Acute MI Revascularization;  Surgeon: Belva Crome, MD;  Location: [place] [facility];  Service: Cardiovascular;  Laterality: N/A;   ESOPHAGOGASTRODUODENOSCOPY (EGD) WITH PROPOFOL N/A 02/26/2021   Procedure: ESOPHAGOGASTRODUODENOSCOPY (EGD) WITH PROPOFOL;  Surgeon: Rogene Houston, MD;  [facility];  Service: Endoscopy;  Laterality: N/A;   ICD IMPLANT N/A 08/24/2019   Procedure: ICD IMPLANT;  Surgeon: Evans Lance, MD;  Location: [place] [facility] LAB;  Service: Cardiovascular;  Laterality: N/A;   IR URETERAL STENT LEFT NEW ACCESS W/O SEP NEPHROSTOMY CATH  09/26/2019   LEFT HEART CATH AND CORONARY ANGIOGRAPHY N/A 03/12/2019   Procedure: LEFT HEART CATH AND CORONARY ANGIOGRAPHY;  Surgeon: Belva Crome, MD;  Location: [place] [facility];  Service: Cardiovascular;  Laterality: N/A;   LITHOTRIPSY Right    NEPHROLITHOTOMY Left 09/26/2019   Procedure: LEFT NEPHROLITHOTOMY PERCUTANEOUS;  Surgeon: Irine Seal, MD;  Location: [facility];  Service: Urology;  Laterality: Left;   TOTAL KNEE ARTHROPLASTY Left 02/02/2022   Procedure: TOTAL KNEE ARTHROPLASTY;  Surgeon: Vickey Huger, MD;  Location: [facility];  Service: Orthopedics;  Laterality: Left;   Patient Active Problem List   Diagnosis Date Noted   S/P total knee replacement 02/02/2022   Encounter for general adult medical examination with abnormal  findings 09/17/2021   Other hyperparathyroidism ([place]) 06/13/2021   Vitamin D deficiency 06/13/2021   Nonintractable episodic headache 04/23/2021   Primary osteoarthritis of both knees 04/23/2021   Anemia due to chronic blood loss 03/05/2021   Gastric ulcer 03/05/2021   Type 2 diabetes with nephropathy ([place])    LUQ pain    Prediabetes 02/22/2021   Left renal stone 09/26/2019   CAD in native artery    Ischemic cardiomyopathy 08/15/2019   Congestive heart failure ([place]) 03/21/2019   ST elevation myocardial infarction involving left anterior descending (LAD) coronary artery (HCC)     Chronic kidney disease, stage IV (severe) ([place]) 06/11/2016   Elevated PSA 06/11/2016   Hyperlipidemia 06/11/2016   Essential hypertension 05/28/2016     PCP: Ihor Dow MD  REFERRING PROVIDER: Vickey Huger, MD Next apt 03/19/22  REFERRING DIAG: PT eval/tx for s/p lt TKA per Vickey Huger, MD  THERAPY DIAG:  Primary osteoarthritis of left knee  Difficulty in walking, not elsewhere classified  Rationale for Evaluation and Treatment Rehabilitation  ONSET DATE: 02/05/2022  SUBJECTIVE:   SUBJECTIVE STATEMENT: Patient states his knee is doing pretty good. His exercises have been alright at home.   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 Yes: NPRS scale: 6/10 Pain location: left knee Pain description: sore, aching Aggravating factors: movement Relieving factors: rest, medicine  PRECAUTIONS: None  WEIGHT BEARING RESTRICTIONS: No  FALLS:  Has patient fallen in last 6 months? No  LIVING ENVIRONMENT: Lives with: lives with their family Lives in: House/apartment Stairs: Yes: External: 2 steps; none Has following equipment at home: Environmental consultant - 2 wheeled, Wheelchair (manual), and bed side commode  OCCUPATION:   PLOF: Independent  PATIENT GOALS: walk   OBJECTIVE:   DIAGNOSTIC FINDINGS: none  PATIENT SURVEYS:  FOTO 44  COGNITION: Overall cognitive status: Within functional limits for tasks assessed     SENSATION: WFL  EDEMA:  Normal for this time s/p     PALPATION: Tenderness normal for this time s/p  LOWER EXTREMITY ROM:  Active ROM Right eval Left eval Left 02/23/22 Left 02/26/22 Left 03/05/02 Left 03/10/22  Hip flexion        Hip extension        Hip abduction        Hip adduction        Hip internal rotation        Hip external rotation        Knee flexion  86 96 98 103 105  Knee extension  -12 -15 11 8  Lacking 7  Ankle dorsiflexion        Ankle plantarflexion        Ankle  inversion        Ankle eversion         (Blank rows = not tested)  LOWER EXTREMITY MMT:  MMT Right eval Left eval  Hip flexion    Hip extension    Hip abduction    Hip adduction    Hip internal rotation    Hip external rotation    Knee flexion  3- (sitting)  Knee extension  Poor to fair quad set  Ankle dorsiflexion    Ankle plantarflexion    Ankle inversion    Ankle eversion     (Blank rows = not tested)   FUNCTIONAL TESTS:  5 times sit to stand: 02/26/22: 5STS 14.98" initially required HHA for 3 reps then able to complete without HHA  for last 2, decreased eccentric contro  GAIT: Distance walked: 10 ft Assistive device utilized: Environmental consultant - 2 wheeled Level of assistance: CGA Comments: decreased heel strike and toe off; extension lag left knee; antalgic and step to gait pattern   TODAY'S TREATMENT                                                                                                                             03/10/22 Supine heel slides with belt 10 x 10 second holds Quad set 10 x 10 second holds Calf stretch on slant board 4x 20 second holds HR x 20 TR x 20  TKE GTB 10 x 10 second holds Step up 4 inch step 1 x 10 with 1 HHA Prone quad stretch with strap 10 x 5 second holds Supine: manual retrograde massage for edema control  03/05/22 Prone knee hang x 1 min Seated: LAQ Standing heel raise 15x 5"  Toe raises  TKE with GTB 10x 5"  Squats with HHA front of chair for mechanics STS no HHA 10x  Supine: manual retrograde massage for edema control Scar tissue massage superior incision when steri strips have fallen off Heel slides AROM 8-103 degrees  03/03/22 Standing:  Rockerboard lateral with cueing to stand tall to reduce leaning. Heel raises 10x 5" TKE with RTB 10x 5"  Sitting: STS no HHA LAQ 10x5"  Supine: quad sets 10x 5" SAQ 10x 5" Bridge 10x 5" SLR with quad set prior raise AROM 10 degrees-- 103 AAROM  02/26/22: Sitting:   LAQ: 10x  5" Heel slides with AAROM at end range 10x 10" holds  Supine: SAQ 10x 5" Quad sets 10x 5" Bridge 10x 5" SLR with quad set prior raise AROM 11-98 degrees  Standing: Heel raises 10x 5" holds Toe raises 10x5" holds  5STS 14.98" initially required HHA for 3 reps then able to complete without HHA for last 2, decreased eccentric control Reviewed goals, educated importance of HEP compliance, increased to complete 100 reps with quad sets  02/23/22 *shortened treatment time patient has to leave early Supine: Quad sets 5" hold x 10 Heel slides x 10 SLR with lag 2 x 10  Sitting LAQ's 2 x 10 Hip flexion 2 x 10  Gait training with RW  PATIENT EDUCATION:  Education details: Patient educated on exam findings, POC, scope of PT, HEP. Person educated: Patient Education method: Explanation, Demonstration, and Handouts Education comprehension: verbalized understanding, returned demonstration, verbal cues required, and tactile cues required   HOME EXERCISE PROGRAM: Access Code: [email or web address] URL: [email or web address] Date: 02/20/2022 Prepared by: [facility] 03/05/22: prone knee hang, heel and toe raises Exercises - Supine Ankle Pumps  - 3 x daily - 7 x weekly - 1 sets - 10 reps - Supine Quad Set  - 3 x daily - 7 x weekly - 1 sets - 10 reps - Supine Straight Leg Hip Adduction and Quad Set with Ball  - 3 x daily -  7 x weekly - 1 sets - 10 reps - Supine Heel Slides  - 3 x daily - 7 x weekly - 1 sets - 10 reps - Supine Active Straight Leg Raise  - 3 x daily - 7 x weekly - 1 sets - 10 reps - Supine Hip Abduction  - 3 x daily - 7 x weekly - 1 sets - 10 reps - Seated Knee Flexion Extension AROM   - 3 x daily - 7 x weekly - 1 sets - 10 reps - Seated Knee Flexion AAROM  - 3 x daily - 7 x weekly - 1 sets - 10 reps - Seated Long Arc Quad  - 3 x daily - 7 x weekly - 1 sets - 10 reps - Supine Knee Extension Stretch on Towel Roll  - 1 x daily - 7 x weekly - 1 sets - 1-5 min  hold  03/05/22:  ASSESSMENT:  CLINICAL IMPRESSION: Patient demonstrating improving ROM from lacking 7 to 105 today after initial warm up exercises. Added calf stretch with cueing for posture and TKE. Patient able to complete additional reps of some previously completed exercises. He tends to rely on weight shift initially with step ups but form improves with cueing for quad use. Manual for edema at end of session. Educated on performance of HEP to reduce soreness. Patient will continue to benefit from physical therapy in order to improve function and reduce impairment.   OBJECTIVE IMPAIRMENTS: Abnormal gait, decreased activity tolerance, decreased balance, decreased endurance, decreased mobility, difficulty walking, decreased ROM, hypomobility, increased edema, increased fascial restrictions, impaired perceived functional ability, increased muscle spasms, impaired flexibility, and pain.   ACTIVITY LIMITATIONS: carrying, lifting, bending, sitting, standing, squatting, sleeping, stairs, transfers, bed mobility, locomotion level, and caring for others  PARTICIPATION LIMITATIONS: meal prep, cleaning, laundry, driving, shopping, and community activity   REHAB POTENTIAL: Good  CLINICAL DECISION MAKING: Stable/uncomplicated  EVALUATION COMPLEXITY: Low   GOALS: Goals reviewed with patient? No  SHORT TERM GOALS: Target date: 03/06/2022  Patient will be independent with initial HEP  Baseline: Goal status: IN PROGRESS  2.  Patient will increase left knee AROM in supine to -8 to 100 to improve ability to navigate steps to enter home. Baseline: see above Goal status: IN PROGRESS    LONG TERM GOALS: Target date: 03/20/2022   Patient will be independent in self management strategies to improve quality of life and functional outcomes.  Baseline:  Goal status: IN PROGRESS  2.  Patient will increase left leg MMTs to 5/5 without pain to promote return to ambulation community distances with  minimal deviation.  Baseline: see above Goal status: IN PROGRESS  3.  Patient will improve FOTO score to predicted value  Baseline: 44 Goal status: IN PROGRESS  4.  Patient will increase left knee AROM in supine to -2 to 120 to perform all household tasks and care for his mother without issue. Baseline:  Goal status: IN PROGRESS  5.  Patient will perform 5 x sit to stand in 20 sec or less to demonstrate good functional mobility and increased lower extremity strength.  Baseline: 02/26/22: 5STS 14.98" initially required HHA for 3 reps then able to complete without HHA for last 2, decreased eccentric control Goal status: IN PROGRESS   PLAN:  PT FREQUENCY: 2x/week  PT DURATION: 4 weeks  PLANNED INTERVENTIONS: Therapeutic exercises, Therapeutic activity, Neuromuscular re-education, Balance training, Gait training, Patient/Family education, Joint manipulation, Joint mobilization, Stair training, Orthotic/Fit training, DME instructions, Aquatic Therapy, Dry  Needling, Electrical stimulation, Spinal manipulation, Spinal mobilization, Cryotherapy, Moist heat, Compression bandaging, scar mobilization, Splintting, Taping, Traction, Ultrasound, Ionotophoresis 4mg /ml Dexamethasone, and Manual therapy  PLAN FOR NEXT SESSION: Focus on quad strengthening and knee mobility. Gait training with LRAD as appropriate. continue slant board, prone quad stretch and contract relax for flexion, continue quad strengthening to improve extension.    1:45 PM, 03/10/22 Mearl Latin PT, DPT Physical Therapist at [facility]

## 2022-03-12 ENCOUNTER — Ambulatory Visit (HOSPITAL_COMMUNITY): Payer: Medicare Other | Admitting: Physical Therapy

## 2022-03-12 ENCOUNTER — Encounter (HOSPITAL_COMMUNITY): Payer: Self-pay | Admitting: Physical Therapy

## 2022-03-12 DIAGNOSIS — M1712 Unilateral primary osteoarthritis, left knee: Secondary | ICD-10-CM | POA: Diagnosis not present

## 2022-03-12 DIAGNOSIS — R262 Difficulty in walking, not elsewhere classified: Secondary | ICD-10-CM

## 2022-03-12 NOTE — Therapy (Signed)
OUTPATIENT PHYSICAL THERAPY LOWER EXTREMITY TREATMENT   Patient Name: [name] MRN: [id number] DOB:19-Oct-1955, 66 y.o., male Today's Date: 03/12/2022   PT End of Session - 03/12/22 1304     Visit Number 7    Number of Visits 8    Date for PT Re-Evaluation 03/20/22    Authorization Type UHC Medicare; no copay, 20% co insurance; no dec, no VL, no auth required    Progress Note Due on Visit 10    PT Start Time 1303    PT Stop Time 1345    PT Time Calculation (min) 42 min    Activity Tolerance Patient tolerated treatment well;No increased pain;Patient limited by fatigue    Behavior During Therapy [facility] for tasks assessed/performed                 Past Medical History:  Diagnosis Date   Acute ST elevation myocardial infarction (STEMI) due to occlusion of mid portion of left anterior descending (LAD) coronary artery ([place]) 78/67/6720   Acute systolic heart failure (HCC)    AICD (automatic cardioverter/defibrillator) present    Arthritis    CHF (congestive heart failure) (HCC)    CKD (chronic kidney disease) stage 4, GFR 15-29 ml/min (HCC)    Diabetes mellitus, type II ([place])    Dyspnea    with exertionm periodically   Heart attack ([place]) 03/15/2019   anterior MI    History of kidney stones    Hyperparathyroidism ([place])    Hypertension 2002   Ischemic cardiomyopathy    Left knee pain    Syncope 04/04/2019   after blood draw   Vitamin D deficiency    Past Surgical History:  Procedure Laterality Date   AMPUTATION TOE Left 1991   2 digit of left foot   BIOPSY  02/26/2021   Procedure: BIOPSY;  Surgeon: Rogene Houston, MD;  [facility];  Service: Endoscopy;;   CHOLECYSTECTOMY     COLONOSCOPY     COLONOSCOPY WITH PROPOFOL N/A 02/26/2021   Procedure: COLONOSCOPY WITH PROPOFOL;  Surgeon: Rogene Houston, MD;  [facility];  Service: Endoscopy;  Laterality: N/A;   CORONARY/GRAFT ACUTE MI REVASCULARIZATION N/A 03/12/2019   Procedure:  Coronary/Graft Acute MI Revascularization;  Surgeon: Belva Crome, MD;  Location: [place] [facility];  Service: Cardiovascular;  Laterality: N/A;   ESOPHAGOGASTRODUODENOSCOPY (EGD) WITH PROPOFOL N/A 02/26/2021   Procedure: ESOPHAGOGASTRODUODENOSCOPY (EGD) WITH PROPOFOL;  Surgeon: Rogene Houston, MD;  [facility];  Service: Endoscopy;  Laterality: N/A;   ICD IMPLANT N/A 08/24/2019   Procedure: ICD IMPLANT;  Surgeon: Evans Lance, MD;  Location: [place] [facility] LAB;  Service: Cardiovascular;  Laterality: N/A;   IR URETERAL STENT LEFT NEW ACCESS W/O SEP NEPHROSTOMY CATH  09/26/2019   LEFT HEART CATH AND CORONARY ANGIOGRAPHY N/A 03/12/2019   Procedure: LEFT HEART CATH AND CORONARY ANGIOGRAPHY;  Surgeon: Belva Crome, MD;  Location: [place] [facility];  Service: Cardiovascular;  Laterality: N/A;   LITHOTRIPSY Right    NEPHROLITHOTOMY Left 09/26/2019   Procedure: LEFT NEPHROLITHOTOMY PERCUTANEOUS;  Surgeon: Irine Seal, MD;  Location: [facility];  Service: Urology;  Laterality: Left;   TOTAL KNEE ARTHROPLASTY Left 02/02/2022   Procedure: TOTAL KNEE ARTHROPLASTY;  Surgeon: Vickey Huger, MD;  Location: [facility];  Service: Orthopedics;  Laterality: Left;   Patient Active Problem List   Diagnosis Date Noted   S/P total knee replacement 02/02/2022   Encounter for general adult medical examination with abnormal  findings 09/17/2021   Other hyperparathyroidism ([place]) 06/13/2021   Vitamin D deficiency 06/13/2021   Nonintractable episodic headache 04/23/2021   Primary osteoarthritis of both knees 04/23/2021   Anemia due to chronic blood loss 03/05/2021   Gastric ulcer 03/05/2021   Type 2 diabetes with nephropathy ([place])    LUQ pain    Prediabetes 02/22/2021   Left renal stone 09/26/2019   CAD in native artery    Ischemic cardiomyopathy 08/15/2019   Congestive heart failure ([place]) 03/21/2019   ST elevation myocardial infarction involving left anterior descending (LAD) coronary artery (HCC)     Chronic kidney disease, stage IV (severe) ([place]) 06/11/2016   Elevated PSA 06/11/2016   Hyperlipidemia 06/11/2016   Essential hypertension 05/28/2016     PCP: Ihor Dow MD  REFERRING PROVIDER: Vickey Huger, MD Next apt 03/19/22  REFERRING DIAG: PT eval/tx for s/p lt TKA per Vickey Huger, MD  THERAPY DIAG:  Primary osteoarthritis of left knee  Difficulty in walking, not elsewhere classified  Rationale for Evaluation and Treatment Rehabilitation  ONSET DATE: 02/05/2022  SUBJECTIVE:   SUBJECTIVE STATEMENT: Patient states he was feeling good after last session and sore yesterday. Some swelling today. Has been elevating at home.   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 Yes: NPRS scale: 7/10 Pain location: left knee Pain description: sore, aching Aggravating factors: movement Relieving factors: rest, medicine  PRECAUTIONS: None  WEIGHT BEARING RESTRICTIONS: No  FALLS:  Has patient fallen in last 6 months? No  LIVING ENVIRONMENT: Lives with: lives with their family Lives in: House/apartment Stairs: Yes: External: 2 steps; none Has following equipment at home: Environmental consultant - 2 wheeled, Wheelchair (manual), and bed side commode  OCCUPATION:   PLOF: Independent  PATIENT GOALS: walk   OBJECTIVE:   DIAGNOSTIC FINDINGS: none  PATIENT SURVEYS:  FOTO 44  COGNITION: Overall cognitive status: Within functional limits for tasks assessed     SENSATION: WFL  EDEMA:  Normal for this time s/p     PALPATION: Tenderness normal for this time s/p  LOWER EXTREMITY ROM:  Active ROM Right eval Left eval Left 02/23/22 Left 02/26/22 Left 03/05/02 Left 03/10/22 Left 03/12/22  Hip flexion         Hip extension         Hip abduction         Hip adduction         Hip internal rotation         Hip external rotation         Knee flexion  86 96 98 103 105 107  Knee extension  -12 -15 11 8  Lacking 7  Lacking 5  Ankle dorsiflexion         Ankle plantarflexion         Ankle inversion         Ankle eversion          (Blank rows = not tested)  LOWER EXTREMITY MMT:  MMT Right eval Left eval  Hip flexion    Hip extension    Hip abduction    Hip adduction    Hip internal rotation    Hip external rotation    Knee flexion  3- (sitting)  Knee extension  Poor to fair quad set  Ankle dorsiflexion    Ankle plantarflexion    Ankle inversion    Ankle eversion     (Blank rows = not tested)   FUNCTIONAL TESTS:  5 times sit to stand: 02/26/22: 5STS 14.98" initially required HHA for 3 reps then able to complete without HHA for last 2, decreased eccentric contro  GAIT: Distance walked: 10 ft Assistive device utilized: Environmental consultant - 2 wheeled Level of assistance: CGA Comments: decreased heel strike and toe off; extension lag left knee; antalgic and step to gait pattern   TODAY'S TREATMENT                                                                                                                             11//9/23 Supine: manual retrograde massage for edema control with LE on green ball for elevation Quad set 10 x 10 second holds Supine heel slides with belt 10 x 10 second holds Supine hamstring sets with green ball 10 x 10 second holds Step up 4 inch 2x 10 with 1 HHA Lateral step up 4 inch with 2 HHA 1 x 10   03/10/22 Supine heel slides with belt 10 x 10 second holds Quad set 10 x 10 second holds Calf stretch on slant board 4x 20 second holds HR x 20 TR x 20  TKE GTB 10 x 10 second holds Step up 4 inch step 1 x 10 with 1 HHA Prone quad stretch with strap 10 x 5 second holds Supine: manual retrograde massage for edema control  03/05/22 Prone knee hang x 1 min Seated: LAQ Standing heel raise 15x 5"  Toe raises  TKE with GTB 10x 5"  Squats with HHA front of chair for mechanics STS no HHA 10x  Supine: manual retrograde massage for edema control Scar tissue massage  superior incision when steri strips have fallen off Heel slides AROM 8-103 degrees  03/03/22 Standing:  Rockerboard lateral with cueing to stand tall to reduce leaning. Heel raises 10x 5" TKE with RTB 10x 5"  Sitting: STS no HHA LAQ 10x5"  Supine: quad sets 10x 5" SAQ 10x 5" Bridge 10x 5" SLR with quad set prior raise AROM 10 degrees-- 103 AAROM  02/26/22: Sitting:   LAQ: 10x 5" Heel slides with AAROM at end range 10x 10" holds  Supine: SAQ 10x 5" Quad sets 10x 5" Bridge 10x 5" SLR with quad set prior raise AROM 11-98 degrees  Standing: Heel raises 10x 5" holds Toe raises 10x5" holds  5STS 14.98" initially required HHA for 3 reps then able to complete without HHA for last 2, decreased eccentric control Reviewed goals, educated importance of HEP compliance, increased to complete 100 reps with quad sets  02/23/22 *shortened treatment time patient has to leave early Supine: Quad sets 5" hold x 10 Heel slides x 10 SLR with lag 2 x 10  Sitting LAQ's 2 x 10 Hip flexion 2 x 10  Gait training with RW  PATIENT EDUCATION:  Education details: Patient educated on exam findings, POC, scope of PT, HEP. Person educated: Patient Education method: Explanation, Demonstration, and Handouts Education comprehension: verbalized understanding, returned demonstration, verbal cues required,  and tactile cues required   HOME EXERCISE PROGRAM: Access Code: [email or web address] URL: [email or web address] Date: 02/20/2022 Prepared by: [facility] 03/05/22: prone knee hang, heel and toe raises Exercises - Supine Ankle Pumps  - 3 x daily - 7 x weekly - 1 sets - 10 reps - Supine Quad Set  - 3 x daily - 7 x weekly - 1 sets - 10 reps - Supine Straight Leg Hip Adduction and Quad Set with Ball  - 3 x daily - 7 x weekly - 1 sets - 10 reps - Supine Heel Slides  - 3 x daily - 7 x weekly - 1 sets - 10 reps - Supine Active Straight Leg Raise  - 3 x daily - 7 x weekly - 1 sets - 10 reps -  Supine Hip Abduction  - 3 x daily - 7 x weekly - 1 sets - 10 reps - Seated Knee Flexion Extension AROM   - 3 x daily - 7 x weekly - 1 sets - 10 reps - Seated Knee Flexion AAROM  - 3 x daily - 7 x weekly - 1 sets - 10 reps - Seated Long Arc Quad  - 3 x daily - 7 x weekly - 1 sets - 10 reps - Supine Knee Extension Stretch on Towel Roll  - 1 x daily - 7 x weekly - 1 sets - 1-5 min hold  03/05/22:  ASSESSMENT:  CLINICAL IMPRESSION: Began session with edema massage with LE propped for elevation. Patient demonstrating improving ROM from lacking 5 to 107 today after initial warm up exercises. Demonstrating improving mechanics with step exercises. Patient will continue to benefit from physical therapy in order to improve function and reduce impairment.   OBJECTIVE IMPAIRMENTS: Abnormal gait, decreased activity tolerance, decreased balance, decreased endurance, decreased mobility, difficulty walking, decreased ROM, hypomobility, increased edema, increased fascial restrictions, impaired perceived functional ability, increased muscle spasms, impaired flexibility, and pain.   ACTIVITY LIMITATIONS: carrying, lifting, bending, sitting, standing, squatting, sleeping, stairs, transfers, bed mobility, locomotion level, and caring for others  PARTICIPATION LIMITATIONS: meal prep, cleaning, laundry, driving, shopping, and community activity   REHAB POTENTIAL: Good  CLINICAL DECISION MAKING: Stable/uncomplicated  EVALUATION COMPLEXITY: Low   GOALS: Goals reviewed with patient? No  SHORT TERM GOALS: Target date: 03/06/2022  Patient will be independent with initial HEP  Baseline: Goal status: IN PROGRESS  2.  Patient will increase left knee AROM in supine to -8 to 100 to improve ability to navigate steps to enter home. Baseline: see above Goal status: IN PROGRESS    LONG TERM GOALS: Target date: 03/20/2022   Patient will be independent in self management strategies to improve quality of life and  functional outcomes.  Baseline:  Goal status: IN PROGRESS  2.  Patient will increase left leg MMTs to 5/5 without pain to promote return to ambulation community distances with minimal deviation.  Baseline: see above Goal status: IN PROGRESS  3.  Patient will improve FOTO score to predicted value  Baseline: 44 Goal status: IN PROGRESS  4.  Patient will increase left knee AROM in supine to -2 to 120 to perform all household tasks and care for his mother without issue. Baseline:  Goal status: IN PROGRESS  5.  Patient will perform 5 x sit to stand in 20 sec or less to demonstrate good functional mobility and increased lower extremity strength.  Baseline: 02/26/22: 5STS 14.98" initially required HHA for 3 reps then able to complete without HHA for last 2,  decreased eccentric control Goal status: IN PROGRESS   PLAN:  PT FREQUENCY: 2x/week  PT DURATION: 4 weeks  PLANNED INTERVENTIONS: Therapeutic exercises, Therapeutic activity, Neuromuscular re-education, Balance training, Gait training, Patient/Family education, Joint manipulation, Joint mobilization, Stair training, Orthotic/Fit training, DME instructions, Aquatic Therapy, Dry Needling, Electrical stimulation, Spinal manipulation, Spinal mobilization, Cryotherapy, Moist heat, Compression bandaging, scar mobilization, Splintting, Taping, Traction, Ultrasound, Ionotophoresis 4mg /ml Dexamethasone, and Manual therapy  PLAN FOR NEXT SESSION: Focus on quad strengthening and knee mobility. Gait training with LRAD as appropriate. continue slant board, prone quad stretch and contract relax for flexion, continue quad strengthening to improve extension.    1:05 PM, 03/12/22 Mearl Latin PT, DPT Physical Therapist at [facility]

## 2022-03-17 ENCOUNTER — Encounter (HOSPITAL_COMMUNITY): Payer: Self-pay | Admitting: Physical Therapy

## 2022-03-17 ENCOUNTER — Ambulatory Visit (HOSPITAL_COMMUNITY): Payer: Medicare Other | Admitting: Physical Therapy

## 2022-03-17 DIAGNOSIS — R262 Difficulty in walking, not elsewhere classified: Secondary | ICD-10-CM | POA: Diagnosis not present

## 2022-03-17 DIAGNOSIS — M1712 Unilateral primary osteoarthritis, left knee: Secondary | ICD-10-CM

## 2022-03-17 NOTE — Therapy (Signed)
OUTPATIENT PHYSICAL THERAPY LOWER EXTREMITY TREATMENT   Patient Name: [name] MRN: [id number] DOB:03/11/1956, 66 y.o., male Today's Date: 03/17/2022  Progress Note   Reporting Period 02/20/22 to 03/17/22   See note below for Objective Data and Assessment of Progress/Goals    PT End of Session - 03/17/22 1259     Visit Number 8    Number of Visits 16    Date for PT Re-Evaluation 04/14/22    Authorization Type UHC Medicare; no copay, 20% co insurance; no dec, no VL, no auth required    Progress Note Due on Visit 18    PT Start Time 1301    PT Stop Time 1342    PT Time Calculation (min) 41 min    Activity Tolerance Patient tolerated treatment well;No increased pain;Patient limited by fatigue    Behavior During Therapy [facility] for tasks assessed/performed                 Past Medical History:  Diagnosis Date   Acute ST elevation myocardial infarction (STEMI) due to occlusion of mid portion of left anterior descending (LAD) coronary artery ([place]) 28/00/3491   Acute systolic heart failure (HCC)    AICD (automatic cardioverter/defibrillator) present    Arthritis    CHF (congestive heart failure) (HCC)    CKD (chronic kidney disease) stage 4, GFR 15-29 ml/min (HCC)    Diabetes mellitus, type II ([place])    Dyspnea    with exertionm periodically   Heart attack ([place]) 03/15/2019   anterior MI    History of kidney stones    Hyperparathyroidism ([place])    Hypertension 2002   Ischemic cardiomyopathy    Left knee pain    Syncope 04/04/2019   after blood draw   Vitamin D deficiency    Past Surgical History:  Procedure Laterality Date   AMPUTATION TOE Left 1991   2 digit of left foot   BIOPSY  02/26/2021   Procedure: BIOPSY;  Surgeon: Rogene Houston, MD;  [facility];  Service: Endoscopy;;   CHOLECYSTECTOMY     COLONOSCOPY     COLONOSCOPY WITH PROPOFOL N/A 02/26/2021   Procedure: COLONOSCOPY WITH PROPOFOL;  Surgeon: Rogene Houston, MD;  Location: [facility];  Service: Endoscopy;  Laterality: N/A;   CORONARY/GRAFT ACUTE MI REVASCULARIZATION N/A 03/12/2019   Procedure: Coronary/Graft Acute MI Revascularization;  Surgeon: Belva Crome, MD;  Location: [place] [facility];  Service: Cardiovascular;  Laterality: N/A;   ESOPHAGOGASTRODUODENOSCOPY (EGD) WITH PROPOFOL N/A 02/26/2021   Procedure: ESOPHAGOGASTRODUODENOSCOPY (EGD) WITH PROPOFOL;  Surgeon: Rogene Houston, MD;  [facility];  Service: Endoscopy;  Laterality: N/A;   ICD IMPLANT N/A 08/24/2019   Procedure: ICD IMPLANT;  Surgeon: Evans Lance, MD;  Location: [place] [facility];  Service: Cardiovascular;  Laterality: N/A;   IR URETERAL STENT LEFT NEW ACCESS W/O SEP NEPHROSTOMY CATH  09/26/2019   LEFT HEART CATH AND CORONARY ANGIOGRAPHY N/A 03/12/2019   Procedure: LEFT HEART CATH AND CORONARY ANGIOGRAPHY;  Surgeon: Belva Crome, MD;  Location: [place] [facility];  Service: Cardiovascular;  Laterality: N/A;   LITHOTRIPSY Right    NEPHROLITHOTOMY Left 09/26/2019   Procedure: LEFT NEPHROLITHOTOMY PERCUTANEOUS;  Surgeon: Irine Seal, MD;  Location: [facility];  Service: Urology;  Laterality: Left;   TOTAL KNEE ARTHROPLASTY Left 02/02/2022   Procedure: TOTAL KNEE ARTHROPLASTY;  Surgeon: Vickey Huger, MD;  Location: [facility];  Service: Orthopedics;  Laterality: Left;   Patient Active Problem  List   Diagnosis Date Noted   S/P total knee replacement 02/02/2022   Encounter for general adult medical examination with abnormal findings 09/17/2021   Other hyperparathyroidism ([place]) 06/13/2021   Vitamin D deficiency 06/13/2021   Nonintractable episodic headache 04/23/2021   Primary osteoarthritis of both knees 04/23/2021   Anemia due to chronic blood loss 03/05/2021   Gastric ulcer 03/05/2021   Type 2 diabetes with nephropathy ([place])    LUQ pain    Prediabetes 02/22/2021   Left renal stone 09/26/2019   CAD in native artery    Ischemic cardiomyopathy 08/15/2019   Congestive heart failure  ([place]) 03/21/2019   ST elevation myocardial infarction involving left anterior descending (LAD) coronary artery (HCC)    Chronic kidney disease, stage IV (severe) ([place]) 06/11/2016   Elevated PSA 06/11/2016   Hyperlipidemia 06/11/2016   Essential hypertension 05/28/2016     PCP: Ihor Dow MD  REFERRING PROVIDER: Vickey Huger, MD Next apt 03/19/22  REFERRING DIAG: PT eval/tx for s/p lt TKA per Vickey Huger, MD  THERAPY DIAG:  Primary osteoarthritis of left knee  Difficulty in walking, not elsewhere classified  Rationale for Evaluation and Treatment Rehabilitation  ONSET DATE: 02/05/2022  SUBJECTIVE:   SUBJECTIVE STATEMENT: Patient states he was sore last night. He has been working on his HEP. Patient feels limited by swelling, tightness, and pain. Patient states 65% improvement since beginning PT.   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 Yes: NPRS scale: 7/10 Pain location: left knee Pain description: sore, aching, tightness Aggravating factors: movement Relieving factors: rest, medicine  PRECAUTIONS: None  WEIGHT BEARING RESTRICTIONS: No  FALLS:  Has patient fallen in last 6 months? No  LIVING ENVIRONMENT: Lives with: lives with their family Lives in: House/apartment Stairs: Yes: External: 2 steps; none Has following equipment at home: Environmental consultant - 2 wheeled, Wheelchair (manual), and bed side commode  OCCUPATION:   PLOF: Independent  PATIENT GOALS: walk   OBJECTIVE:   DIAGNOSTIC FINDINGS: none  PATIENT SURVEYS:  FOTO 44  03/17/22 41% function  COGNITION: Overall cognitive status: Within functional limits for tasks assessed     SENSATION: WFL  EDEMA:  Normal for this time s/p     PALPATION: Tenderness normal for this time s/p  LOWER EXTREMITY ROM:  Active ROM Right eval Left eval Left 02/23/22 Left 02/26/22 Left 03/05/02 Left 03/10/22 Left  03/12/22 Left 03/17/22  Hip  flexion          Hip extension          Hip abduction          Hip adduction          Hip internal rotation          Hip external rotation          Knee flexion  86 96 98 103 105 107 107  Knee extension  -12 -_0 Lacking 7 Lacking 5 Lacking 7  Ankle dorsiflexion          Ankle plantarflexion          Ankle inversion          Ankle eversion           (Blank rows = not tested)  LOWER EXTREMITY MMT:  MMT Right eval Left eval Right 03/17/22 Left 03/17/22  Hip flexion   5 5  Hip extension      Hip abduction  Hip adduction      Hip internal rotation      Hip external rotation      Knee flexion  3- (sitting) 5 4+  Knee extension  Poor to fair quad set 5 4+  Ankle dorsiflexion   5 5  Ankle plantarflexion      Ankle inversion      Ankle eversion       (Blank rows = not tested)   FUNCTIONAL TESTS:  5 times sit to stand: 02/26/22: 5STS 14.98" initially required HHA for 3 reps then able to complete without HHA for last 2, decreased eccentric contro  GAIT: Distance walked: 10 ft Assistive device utilized: Environmental consultant - 2 wheeled Level of assistance: CGA Comments: decreased heel strike and toe off; extension lag left knee; antalgic and step to gait pattern Reassessment 03/17/22: 5 times sit to stand: 10.98 seconds with use of hands on first rep, relies on momentum FOTO: 41% function  TODAY'S TREATMENT                                                                                                                             03/17/22 Reassessment Step up 4 inch 1 x 10, 6 inch 2 x 15 Lateral step up 6 inch 1x 15   Squat 1 x 10   11//9/23 Supine: manual retrograde massage for edema control with LE on green ball for elevation Quad set 10 x 10 second holds Supine heel slides with belt 10 x 10 second holds Supine hamstring sets with green ball 10 x 10 second holds Step up 4 inch 2x 10 with 1 HHA Lateral step up 4 inch with 2 HHA 1 x 10   03/10/22 Supine heel slides with  belt 10 x 10 second holds Quad set 10 x 10 second holds Calf stretch on slant board 4x 20 second holds HR x 20 TR x 20  TKE GTB 10 x 10 second holds Step up 4 inch step 1 x 10 with 1 HHA Prone quad stretch with strap 10 x 5 second holds Supine: manual retrograde massage for edema control  03/05/22 Prone knee hang x 1 min Seated: LAQ Standing heel raise 15x 5"  Toe raises  TKE with GTB 10x 5"  Squats with HHA front of chair for mechanics STS no HHA 10x  Supine: manual retrograde massage for edema control Scar tissue massage superior incision when steri strips have fallen off Heel slides AROM 8-103 degrees  03/03/22 Standing:  Rockerboard lateral with cueing to stand tall to reduce leaning. Heel raises 10x 5" TKE with RTB 10x 5"  Sitting: STS no HHA LAQ 10x5"  Supine: quad sets 10x 5" SAQ 10x 5" Bridge 10x 5" SLR with quad set prior raise AROM 10 degrees-- 103 AAROM    PATIENT EDUCATION:  Education details: 03/17/22: reassessment, HEP; Eval: Patient educated on exam findings, POC, scope of PT, HEP. Person educated: Patient Education method: Explanation, Demonstration, and Handouts Education  comprehension: verbalized understanding, returned demonstration, verbal cues required, and tactile cues required   HOME EXERCISE PROGRAM: Access Code: [email or web address] URL: [email or web address] Date: 02/20/2022 Prepared by: [facility] 03/05/22: prone knee hang, heel and toe raises Exercises - Supine Ankle Pumps  - 3 x daily - 7 x weekly - 1 sets - 10 reps - Supine Quad Set  - 3 x daily - 7 x weekly - 1 sets - 10 reps - Supine Straight Leg Hip Adduction and Quad Set with Ball  - 3 x daily - 7 x weekly - 1 sets - 10 reps - Supine Heel Slides  - 3 x daily - 7 x weekly - 1 sets - 10 reps - Supine Active Straight Leg Raise  - 3 x daily - 7 x weekly - 1 sets - 10 reps - Supine Hip Abduction  - 3 x daily - 7 x weekly - 1 sets - 10 reps - Seated Knee Flexion Extension AROM    - 3 x daily - 7 x weekly - 1 sets - 10 reps - Seated Knee Flexion AAROM  - 3 x daily - 7 x weekly - 1 sets - 10 reps - Seated Long Arc Quad  - 3 x daily - 7 x weekly - 1 sets - 10 reps - Supine Knee Extension Stretch on Towel Roll  - 1 x daily - 7 x weekly - 1 sets - 1-5 min hold  03/05/22:  ASSESSMENT:  CLINICAL IMPRESSION: Patient overall making good progress but is limited by continued symptoms. Patient has met 2/2 short term goals and 1/5 long term goals with ability to complete HEP and improvement in symptoms, strength, ROM, activity tolerance, gait, balance, and functional mobility. Remaining goals not met due to continued deficits in strength, ROM, functional mobility, activity tolerance, gait and symptoms. Patient able to complete additional reps of stair exercises on increased height step. Patient will continue to benefit from skilled physical therapy in order to improve function and reduce impairment.    OBJECTIVE IMPAIRMENTS: Abnormal gait, decreased activity tolerance, decreased balance, decreased endurance, decreased mobility, difficulty walking, decreased ROM, hypomobility, increased edema, increased fascial restrictions, impaired perceived functional ability, increased muscle spasms, impaired flexibility, and pain.   ACTIVITY LIMITATIONS: carrying, lifting, bending, sitting, standing, squatting, sleeping, stairs, transfers, bed mobility, locomotion level, and caring for others  PARTICIPATION LIMITATIONS: meal prep, cleaning, laundry, driving, shopping, and community activity   REHAB POTENTIAL: Good  CLINICAL DECISION MAKING: Stable/uncomplicated  EVALUATION COMPLEXITY: Low   GOALS: Goals reviewed with patient? No  SHORT TERM GOALS: Target date: 03/06/2022  Patient will be independent with initial HEP  Baseline: Goal status: MET  2.  Patient will increase left knee AROM in supine to -8 to 100 to improve ability to navigate steps to enter home. Baseline: see  above Goal status: MET    LONG TERM GOALS: Target date: 03/20/2022   Patient will be independent in self management strategies to improve quality of life and functional outcomes.  Baseline:  Goal status: IN PROGRESS  2.  Patient will increase left leg MMTs to 5/5 without pain to promote return to ambulation community distances with minimal deviation.  Baseline: see above Goal status: IN PROGRESS  3.  Patient will improve FOTO score to predicted value  Baseline: 44 03/17/22: 41% function Goal status: IN PROGRESS  4.  Patient will increase left knee AROM in supine to -2 to 120 to perform all household tasks and care for his mother without issue.  Baseline:  Goal status: IN PROGRESS  5.  Patient will perform 5 x sit to stand in 20 sec or less to demonstrate good functional mobility and increased lower extremity strength.  Baseline: 02/26/22: 5STS 14.98" initially required HHA for 3 reps then able to complete without HHA for last 2, decreased eccentric control 03/17/22 10.98 seconds with use of hands on first rep, relies on momentum Goal status: MET   PLAN:  PT FREQUENCY: 2x/week  PT DURATION: 4 weeks  PLANNED INTERVENTIONS: Therapeutic exercises, Therapeutic activity, Neuromuscular re-education, Balance training, Gait training, Patient/Family education, Joint manipulation, Joint mobilization, Stair training, Orthotic/Fit training, DME instructions, Aquatic Therapy, Dry Needling, Electrical stimulation, Spinal manipulation, Spinal mobilization, Cryotherapy, Moist heat, Compression bandaging, scar mobilization, Splintting, Taping, Traction, Ultrasound, Ionotophoresis 75m/ml Dexamethasone, and Manual therapy  PLAN FOR NEXT SESSION: Focus on quad strengthening and knee mobility. Gait training with LRAD as appropriate. continue slant board, prone quad stretch and contract relax for flexion, continue quad strengthening to improve extension.    1:50 PM, 03/17/22 AMearl LatinPT,  DPT Physical Therapist [facility]

## 2022-03-19 ENCOUNTER — Encounter (HOSPITAL_COMMUNITY): Payer: Medicare Other | Admitting: Physical Therapy

## 2022-03-24 ENCOUNTER — Ambulatory Visit (HOSPITAL_COMMUNITY): Payer: Medicare Other

## 2022-03-24 DIAGNOSIS — R262 Difficulty in walking, not elsewhere classified: Secondary | ICD-10-CM

## 2022-03-24 DIAGNOSIS — M1712 Unilateral primary osteoarthritis, left knee: Secondary | ICD-10-CM | POA: Diagnosis not present

## 2022-03-24 NOTE — Therapy (Signed)
OUTPATIENT PHYSICAL THERAPY LOWER EXTREMITY TREATMENT   Patient Name: [name] MRN: [id number] DOB:1955/06/26, 66 y.o., male Today's Date: 03/24/2022    PT End of Session - 03/24/22 0945     Visit Number 9    Number of Visits 16    Date for PT Re-Evaluation 04/14/22    Authorization Type UHC Medicare; no copay, 20% co insurance; no dec, no VL, no auth required    Progress Note Due on Visit 18    PT Start Time 0945    PT Stop Time 1030    PT Time Calculation (min) 45 min    Activity Tolerance Patient tolerated treatment well;No increased pain;Patient limited by fatigue    Behavior During Therapy [facility] for tasks assessed/performed                 Past Medical History:  Diagnosis Date   Acute ST elevation myocardial infarction (STEMI) due to occlusion of mid portion of left anterior descending (LAD) coronary artery ([place]) 78/46/9629   Acute systolic heart failure (HCC)    AICD (automatic cardioverter/defibrillator) present    Arthritis    CHF (congestive heart failure) (HCC)    CKD (chronic kidney disease) stage 4, GFR 15-29 ml/min (HCC)    Diabetes mellitus, type II ([place])    Dyspnea    with exertionm periodically   Heart attack ([place]) 03/15/2019   anterior MI    History of kidney stones    Hyperparathyroidism ([place])    Hypertension 2002   Ischemic cardiomyopathy    Left knee pain    Syncope 04/04/2019   after blood draw   Vitamin D deficiency    Past Surgical History:  Procedure Laterality Date   AMPUTATION TOE Left 1991   2 digit of left foot   BIOPSY  02/26/2021   Procedure: BIOPSY;  Surgeon: Rogene Houston, MD;  [facility];  Service: Endoscopy;;   CHOLECYSTECTOMY     COLONOSCOPY     COLONOSCOPY WITH PROPOFOL N/A 02/26/2021   Procedure: COLONOSCOPY WITH PROPOFOL;  Surgeon: Rogene Houston, MD;  [facility];  Service: Endoscopy;  Laterality: N/A;   CORONARY/GRAFT ACUTE MI REVASCULARIZATION N/A 03/12/2019   Procedure:  Coronary/Graft Acute MI Revascularization;  Surgeon: Belva Crome, MD;  Location: [place] CV LAB;  Service: Cardiovascular;  Laterality: N/A;   ESOPHAGOGASTRODUODENOSCOPY (EGD) WITH PROPOFOL N/A 02/26/2021   Procedure: ESOPHAGOGASTRODUODENOSCOPY (EGD) WITH PROPOFOL;  Surgeon: Rogene Houston, MD;  [facility];  Service: Endoscopy;  Laterality: N/A;   ICD IMPLANT N/A 08/24/2019   Procedure: ICD IMPLANT;  Surgeon: Evans Lance, MD;  Location: [place] CV LAB;  Service: Cardiovascular;  Laterality: N/A;   IR URETERAL STENT LEFT NEW ACCESS W/O SEP NEPHROSTOMY CATH  09/26/2019   LEFT HEART CATH AND CORONARY ANGIOGRAPHY N/A 03/12/2019   Procedure: LEFT HEART CATH AND CORONARY ANGIOGRAPHY;  Surgeon: Belva Crome, MD;  Location: [place] CV LAB;  Service: Cardiovascular;  Laterality: N/A;   LITHOTRIPSY Right    NEPHROLITHOTOMY Left 09/26/2019   Procedure: LEFT NEPHROLITHOTOMY PERCUTANEOUS;  Surgeon: Irine Seal, MD;  Location: [facility];  Service: Urology;  Laterality: Left;   TOTAL KNEE ARTHROPLASTY Left 02/02/2022   Procedure: TOTAL KNEE ARTHROPLASTY;  Surgeon: Vickey Huger, MD;  Location: [facility];  Service: Orthopedics;  Laterality: Left;   Patient Active Problem List   Diagnosis Date Noted   S/P total knee replacement 02/02/2022   Encounter for general adult medical examination with  abnormal findings 09/17/2021   Other hyperparathyroidism ([place]) 06/13/2021   Vitamin D deficiency 06/13/2021   Nonintractable episodic headache 04/23/2021   Primary osteoarthritis of both knees 04/23/2021   Anemia due to chronic blood loss 03/05/2021   Gastric ulcer 03/05/2021   Type 2 diabetes with nephropathy ([place])    LUQ pain    Prediabetes 02/22/2021   Left renal stone 09/26/2019   CAD in native artery    Ischemic cardiomyopathy 08/15/2019   Congestive heart failure ([place]) 03/21/2019   ST elevation myocardial infarction involving left anterior descending (LAD) coronary artery (HCC)     Chronic kidney disease, stage IV (severe) ([place]) 06/11/2016   Elevated PSA 06/11/2016   Hyperlipidemia 06/11/2016   Essential hypertension 05/28/2016     PCP: Ihor Dow MD  REFERRING PROVIDER: Vickey Huger, MD Next apt 03/19/22  REFERRING DIAG: PT eval/tx for s/p lt TKA per Vickey Huger, MD  THERAPY DIAG:  Primary osteoarthritis of left knee  Difficulty in walking, not elsewhere classified  Rationale for Evaluation and Treatment Rehabilitation  ONSET DATE: 02/05/2022  SUBJECTIVE:   SUBJECTIVE STATEMENT: Patient complains of left knee stiffness; still swelling some; not using an assistive device at home  PERTINENT HISTORY: 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 Yes: NPRS scale: 6/10 Pain location: left knee Pain description: sore, aching, tightness Aggravating factors: movement Relieving factors: rest, medicine  PRECAUTIONS: None  WEIGHT BEARING RESTRICTIONS: No  FALLS:  Has patient fallen in last 6 months? No  LIVING ENVIRONMENT: Lives with: lives with their family Lives in: House/apartment Stairs: Yes: External: 2 steps; none Has following equipment at home: Environmental consultant - 2 wheeled, Wheelchair (manual), and bed side commode  OCCUPATION:   PLOF: Independent  PATIENT GOALS: walk   OBJECTIVE:   DIAGNOSTIC FINDINGS: none  PATIENT SURVEYS:  FOTO 44  03/17/22 41% function  COGNITION: Overall cognitive status: Within functional limits for tasks assessed     SENSATION: WFL  EDEMA:  Normal for this time s/p     PALPATION: Tenderness normal for this time s/p  LOWER EXTREMITY ROM:  Active ROM Right eval Left eval Left 02/23/22 Left 02/26/22 Left 03/05/02 Left 03/10/22 Left  03/12/22 Left 03/17/22  Hip flexion          Hip extension          Hip abduction          Hip adduction          Hip internal rotation          Hip external rotation          Knee flexion  86 96 98 103 105 107 107  Knee  extension  -12 -_0 Lacking 7 Lacking 5 Lacking 7  Ankle dorsiflexion          Ankle plantarflexion          Ankle inversion          Ankle eversion           (Blank rows = not tested)  LOWER EXTREMITY MMT:  MMT Right eval Left eval Right 03/17/22 Left 03/17/22  Hip flexion   5 5  Hip extension      Hip abduction      Hip adduction      Hip internal rotation      Hip external rotation      Knee flexion  3- (sitting) 5 4+  Knee extension  Poor to  fair quad set 5 4+  Ankle dorsiflexion   5 5  Ankle plantarflexion      Ankle inversion      Ankle eversion       (Blank rows = not tested)   FUNCTIONAL TESTS:  5 times sit to stand: 02/26/22: 5STS 14.98" initially required HHA for 3 reps then able to complete without HHA for last 2, decreased eccentric contro  GAIT: Distance walked: 10 ft Assistive device utilized: Environmental consultant - 2 wheeled Level of assistance: CGA Comments: decreased heel strike and toe off; extension lag left knee; antalgic and step to gait pattern Reassessment 03/17/22: 5 times sit to stand: 10.98 seconds with use of hands on first rep, relies on momentum FOTO: 41% function  TODAY'S TREATMENT                                                                                                                             03/24/22 Supine: Quad sets Left knee 5" x 10 STM to left knee to decrease pain and increase tissue mobility, Manual knee extension x 5  Sitting: manual knee flexion x 5  RB Precor seat 10 x 5' 1/2 revolutions  Standing: Heel/toe raises 2 x 10 Slant board 5 x 20" Knee drives on 8" box x 2 minutes Hamstring stretch with leg on 8" box 5 x 20" squat 2 x 10       03/17/22 Reassessment Step up 4 inch 1 x 10, 6 inch 2 x 15 Lateral step up 6 inch 1x 15   Squat 1 x 10   11//9/23 Supine: manual retrograde massage for edema control with LE on green ball for elevation Quad set 10 x 10 second holds Supine heel slides with belt 10 x 10  second holds Supine hamstring sets with green ball 10 x 10 second holds Step up 4 inch 2x 10 with 1 HHA Lateral step up 4 inch with 2 HHA 1 x 10   03/10/22 Supine heel slides with belt 10 x 10 second holds Quad set 10 x 10 second holds Calf stretch on slant board 4x 20 second holds HR x 20 TR x 20  TKE GTB 10 x 10 second holds Step up 4 inch step 1 x 10 with 1 HHA Prone quad stretch with strap 10 x 5 second holds Supine: manual retrograde massage for edema control  03/05/22 Prone knee hang x 1 min Seated: LAQ Standing heel raise 15x 5"  Toe raises  TKE with GTB 10x 5"  Squats with HHA front of chair for mechanics STS no HHA 10x  Supine: manual retrograde massage for edema control Scar tissue massage superior incision when steri strips have fallen off Heel slides AROM 8-103 degrees  03/03/22 Standing:  Rockerboard lateral with cueing to stand tall to reduce leaning. Heel raises 10x 5" TKE with RTB 10x 5"  Sitting: STS no HHA LAQ 10x5"  Supine: quad sets 10x 5" SAQ 10x 5" Bridge  10x 5" SLR with quad set prior raise AROM 10 degrees-- 103 AAROM    PATIENT EDUCATION:  Education details: 03/17/22: reassessment, HEP; Eval: Patient educated on exam findings, POC, scope of PT, HEP. Person educated: Patient Education method: Explanation, Demonstration, and Handouts Education comprehension: verbalized understanding, returned demonstration, verbal cues required, and tactile cues required   HOME EXERCISE PROGRAM: Access Code: [email or web address] URL: [email or web address] Date: 02/20/2022 Prepared by: [facility] 03/05/22: prone knee hang, heel and toe raises Exercises - Supine Ankle Pumps  - 3 x daily - 7 x weekly - 1 sets - 10 reps - Supine Quad Set  - 3 x daily - 7 x weekly - 1 sets - 10 reps - Supine Straight Leg Hip Adduction and Quad Set with Ball  - 3 x daily - 7 x weekly - 1 sets - 10 reps - Supine Heel Slides  - 3 x daily - 7 x weekly - 1 sets - 10 reps -  Supine Active Straight Leg Raise  - 3 x daily - 7 x weekly - 1 sets - 10 reps - Supine Hip Abduction  - 3 x daily - 7 x weekly - 1 sets - 10 reps - Seated Knee Flexion Extension AROM   - 3 x daily - 7 x weekly - 1 sets - 10 reps - Seated Knee Flexion AAROM  - 3 x daily - 7 x weekly - 1 sets - 10 reps - Seated Long Arc Quad  - 3 x daily - 7 x weekly - 1 sets - 10 reps - Supine Knee Extension Stretch on Towel Roll  - 1 x daily - 7 x weekly - 1 sets - 1-5 min hold  03/05/22:  ASSESSMENT:  CLINICAL IMPRESSION: Patient arrives to session in transport chair.  Today's session continued to focus on left knee mobility and strengthening.  He is still quite painful with supine quad sets; guarding of the left knee.  Added recumbent bike for mobility; patient able to make one backwards revolution but it is quite painful for him.  Continued with stretching for both left knee flexion and extension. Patient ambulates in gym without AD with significant limp; encourage patient to use a cane for safety.    Patient will continue to benefit from skilled physical therapy in order to improve function and reduce impairment.    OBJECTIVE IMPAIRMENTS: Abnormal gait, decreased activity tolerance, decreased balance, decreased endurance, decreased mobility, difficulty walking, decreased ROM, hypomobility, increased edema, increased fascial restrictions, impaired perceived functional ability, increased muscle spasms, impaired flexibility, and pain.   ACTIVITY LIMITATIONS: carrying, lifting, bending, sitting, standing, squatting, sleeping, stairs, transfers, bed mobility, locomotion level, and caring for others  PARTICIPATION LIMITATIONS: meal prep, cleaning, laundry, driving, shopping, and community activity   REHAB POTENTIAL: Good  CLINICAL DECISION MAKING: Stable/uncomplicated  EVALUATION COMPLEXITY: Low   GOALS: Goals reviewed with patient? No  SHORT TERM GOALS: Target date: 03/06/2022  Patient will be  independent with initial HEP  Baseline: Goal status: MET  2.  Patient will increase left knee AROM in supine to -8 to 100 to improve ability to navigate steps to enter home. Baseline: see above Goal status: MET    LONG TERM GOALS: Target date: 03/20/2022   Patient will be independent in self management strategies to improve quality of life and functional outcomes.  Baseline:  Goal status: IN PROGRESS  2.  Patient will increase left leg MMTs to 5/5 without pain to promote return to ambulation community distances with  minimal deviation.  Baseline: see above Goal status: IN PROGRESS  3.  Patient will improve FOTO score to predicted value  Baseline: 44 03/17/22: 41% function Goal status: IN PROGRESS  4.  Patient will increase left knee AROM in supine to -2 to 120 to perform all household tasks and care for his mother without issue. Baseline:  Goal status: IN PROGRESS  5.  Patient will perform 5 x sit to stand in 20 sec or less to demonstrate good functional mobility and increased lower extremity strength.  Baseline: 02/26/22: 5STS 14.98" initially required HHA for 3 reps then able to complete without HHA for last 2, decreased eccentric control 03/17/22 10.98 seconds with use of hands on first rep, relies on momentum Goal status: MET   PLAN:  PT FREQUENCY: 2x/week  PT DURATION: 4 weeks  PLANNED INTERVENTIONS: Therapeutic exercises, Therapeutic activity, Neuromuscular re-education, Balance training, Gait training, Patient/Family education, Joint manipulation, Joint mobilization, Stair training, Orthotic/Fit training, DME instructions, Aquatic Therapy, Dry Needling, Electrical stimulation, Spinal manipulation, Spinal mobilization, Cryotherapy, Moist heat, Compression bandaging, scar mobilization, Splintting, Taping, Traction, Ultrasound, Ionotophoresis 3m/ml Dexamethasone, and Manual therapy  PLAN FOR NEXT SESSION: Focus on quad strengthening and knee mobility. Gait training  with LRAD as appropriate. continue slant board, prone quad stretch and contract relax for flexion, continue quad strengthening to improve extension.  Reassess next visit  10:38 AM, 03/24/22 Vana Arif Small Brycelyn Gambino MPT [facility] [place] #[phone]

## 2022-03-30 ENCOUNTER — Ambulatory Visit (HOSPITAL_COMMUNITY): Payer: Medicare Other

## 2022-03-30 DIAGNOSIS — R262 Difficulty in walking, not elsewhere classified: Secondary | ICD-10-CM

## 2022-03-30 DIAGNOSIS — M1712 Unilateral primary osteoarthritis, left knee: Secondary | ICD-10-CM | POA: Diagnosis not present

## 2022-03-30 NOTE — Therapy (Signed)
OUTPATIENT PHYSICAL THERAPY LOWER EXTREMITY PROGRESS NOTE  Progress Note Reporting Period 02/20/2022 to 03/30/2022  See note below for Objective Data and Assessment of Progress/Goals.       Patient Name: [name] MRN: [id number] DOB:15-Apr-1956, 66 y.o., male Today's Date: 03/30/2022    PT End of Session - 03/30/22 1353     Visit Number 10    Number of Visits 16    Date for PT Re-Evaluation 04/14/22    Authorization Type UHC Medicare; no copay, 20% co insurance; no dec, no VL, no auth required    Progress Note Due on Visit 18    PT Start Time 1345    PT Stop Time 1430    PT Time Calculation (min) 45 min    Activity Tolerance Patient tolerated treatment well;No increased pain;Patient limited by fatigue    Behavior During Therapy [facility] for tasks assessed/performed                 Past Medical History:  Diagnosis Date   Acute ST elevation myocardial infarction (STEMI) due to occlusion of mid portion of left anterior descending (LAD) coronary artery ([place]) 46/80/3212   Acute systolic heart failure (HCC)    AICD (automatic cardioverter/defibrillator) present    Arthritis    CHF (congestive heart failure) (HCC)    CKD (chronic kidney disease) stage 4, GFR 15-29 ml/min (HCC)    Diabetes mellitus, type II ([place])    Dyspnea    with exertionm periodically   Heart attack ([place]) 03/15/2019   anterior MI    History of kidney stones    Hyperparathyroidism ([place])    Hypertension 2002   Ischemic cardiomyopathy    Left knee pain    Syncope 04/04/2019   after blood draw   Vitamin D deficiency    Past Surgical History:  Procedure Laterality Date   AMPUTATION TOE Left 1991   2 digit of left foot   BIOPSY  02/26/2021   Procedure: BIOPSY;  Surgeon: Rogene Houston, MD;  [facility];  Service: Endoscopy;;   CHOLECYSTECTOMY     COLONOSCOPY     COLONOSCOPY WITH PROPOFOL N/A 02/26/2021   Procedure: COLONOSCOPY WITH PROPOFOL;  Surgeon: Rogene Houston, MD;   [facility];  Service: Endoscopy;  Laterality: N/A;   CORONARY/GRAFT ACUTE MI REVASCULARIZATION N/A 03/12/2019   Procedure: Coronary/Graft Acute MI Revascularization;  Surgeon: Belva Crome, MD;  Location: [place] CV LAB;  Service: Cardiovascular;  Laterality: N/A;   ESOPHAGOGASTRODUODENOSCOPY (EGD) WITH PROPOFOL N/A 02/26/2021   Procedure: ESOPHAGOGASTRODUODENOSCOPY (EGD) WITH PROPOFOL;  Surgeon: Rogene Houston, MD;  [facility];  Service: Endoscopy;  Laterality: N/A;   ICD IMPLANT N/A 08/24/2019   Procedure: ICD IMPLANT;  Surgeon: Evans Lance, MD;  Location: [place] CV LAB;  Service: Cardiovascular;  Laterality: N/A;   IR URETERAL STENT LEFT NEW ACCESS W/O SEP NEPHROSTOMY CATH  09/26/2019   LEFT HEART CATH AND CORONARY ANGIOGRAPHY N/A 03/12/2019   Procedure: LEFT HEART CATH AND CORONARY ANGIOGRAPHY;  Surgeon: Belva Crome, MD;  Location: [place] [facility] LAB;  Service: Cardiovascular;  Laterality: N/A;   LITHOTRIPSY Right    NEPHROLITHOTOMY Left 09/26/2019   Procedure: LEFT NEPHROLITHOTOMY PERCUTANEOUS;  Surgeon: Irine Seal, MD;  Location: [facility];  Service: Urology;  Laterality: Left;   TOTAL KNEE ARTHROPLASTY Left 02/02/2022   Procedure: TOTAL KNEE ARTHROPLASTY;  Surgeon: Vickey Huger, MD;  Location: [facility];  Service: Orthopedics;  Laterality: Left;   Patient  Active Problem List   Diagnosis Date Noted   S/P total knee replacement 02/02/2022   Encounter for general adult medical examination with abnormal findings 09/17/2021   Other hyperparathyroidism ([place]) 06/13/2021   Vitamin D deficiency 06/13/2021   Nonintractable episodic headache 04/23/2021   Primary osteoarthritis of both knees 04/23/2021   Anemia due to chronic blood loss 03/05/2021   Gastric ulcer 03/05/2021   Type 2 diabetes with nephropathy ([place])    LUQ pain    Prediabetes 02/22/2021   Left renal stone 09/26/2019   CAD in native artery    Ischemic cardiomyopathy 08/15/2019   Congestive  heart failure ([place]) 03/21/2019   ST elevation myocardial infarction involving left anterior descending (LAD) coronary artery (HCC)    Chronic kidney disease, stage IV (severe) ([place]) 06/11/2016   Elevated PSA 06/11/2016   Hyperlipidemia 06/11/2016   Essential hypertension 05/28/2016     PCP: Ihor Dow MD  REFERRING PROVIDER: Vickey Huger, MD Next apt 03/19/22  REFERRING DIAG: PT eval/tx for s/p lt TKA per Vickey Huger, MD  THERAPY DIAG:  Primary osteoarthritis of left knee  Difficulty in walking, not elsewhere classified  Rationale for Evaluation and Treatment Rehabilitation  ONSET DATE: 02/05/2022  SUBJECTIVE:   SUBJECTIVE STATEMENT: Left knee is stiff  PERTINENT HISTORY: 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 Yes: NPRS scale: 5/10 Pain location: left knee Pain description: sore, aching, tightness Aggravating factors: movement Relieving factors: rest, medicine  PRECAUTIONS: None  WEIGHT BEARING RESTRICTIONS: No  FALLS:  Has patient fallen in last 6 months? No  LIVING ENVIRONMENT: Lives with: lives with their family Lives in: House/apartment Stairs: Yes: External: 2 steps; none Has following equipment at home: Walker - 2 wheeled, Wheelchair (manual), and bed side commode  OCCUPATION:   PLOF: Independent  PATIENT GOALS: walk   OBJECTIVE:   DIAGNOSTIC FINDINGS: none  PATIENT SURVEYS:  FOTO 44  03/17/22 41% function  COGNITION: Overall cognitive status: Within functional limits for tasks assessed     SENSATION: WFL  EDEMA:  Normal for this time s/p     PALPATION: Tenderness normal for this time s/p  LOWER EXTREMITY ROM:  Active ROM Right eval Left eval Left 02/23/22 Left 02/26/22 Left 03/05/02 Left 03/10/22 Left  03/12/22 Left 03/17/22 Left 03/30/22  Hip flexion           Hip extension           Hip abduction           Hip adduction           Hip internal rotation           Hip  external rotation           Knee flexion  86 96 98 103 105 107 107 110  Knee extension  -12 -_0 Lacking 7 Lacking 5 Lacking 7 -10 (lacking)  Ankle dorsiflexion           Ankle plantarflexion           Ankle inversion           Ankle eversion            (Blank rows = not tested)  LOWER EXTREMITY MMT:  MMT Right eval Left eval Right 03/17/22 Left 03/17/22 Left 03/30/22  Hip flexion   5 5   Hip extension       Hip abduction       Hip adduction  Hip internal rotation       Hip external rotation       Knee flexion  3- (sitting) 5 4+ 4+  Knee extension  Poor to fair quad set 5 4+ 5  Ankle dorsiflexion   _0 Ankle plantarflexion       Ankle inversion       Ankle eversion        (Blank rows = not tested)   FUNCTIONAL TESTS:  5 times sit to stand: 02/26/22: 5STS 14.98" initially required HHA for 3 reps then able to complete without HHA for last 2, decreased eccentric contro  GAIT: Distance walked: 10 ft Assistive device utilized: Environmental consultant - 2 wheeled Level of assistance: CGA Comments: decreased heel strike and toe off; extension lag left knee; antalgic and step to gait pattern Reassessment 03/17/22: 5 times sit to stand: 10.98 seconds with use of hands on first rep, relies on momentum FOTO: 41% function  TODAY'S TREATMENT                                                                                                                             03/30/22 Progress note AROM Left knee -10 to 110   Supine: Quad sets 5" x 10  Seated: Heel/toe raises x 20 Marching x 20 LAQ's 2 x 10 Manual knee flexion stretch x 8 Hamstring stretch with strap 10 x 10"  Prone hang x 2'  RB seat 10 x 5'  Ambulation down to gym and back to front of hospital without AD (encouraged patient to bring cane)    03/24/22 Supine: Quad sets Left knee 5" x 10 STM to left knee to decrease pain and increase tissue mobility, Manual knee extension x 5  Sitting: manual knee flexion x  5  RB Precor seat 10 x 5' 1/2 revolutions  Standing: Heel/toe raises 2 x 10 Slant board 5 x 20" Knee drives on 8" box x 2 minutes Hamstring stretch with leg on 8" box 5 x 20" squat 2 x 10       03/17/22 Reassessment Step up 4 inch 1 x 10, 6 inch 2 x 15 Lateral step up 6 inch 1x 15   Squat 1 x 10   11//9/23 Supine: manual retrograde massage for edema control with LE on green ball for elevation Quad set 10 x 10 second holds Supine heel slides with belt 10 x 10 second holds Supine hamstring sets with green ball 10 x 10 second holds Step up 4 inch 2x 10 with 1 HHA Lateral step up 4 inch with 2 HHA 1 x 10   03/10/22 Supine heel slides with belt 10 x 10 second holds Quad set 10 x 10 second holds Calf stretch on slant board 4x 20 second holds HR x 20 TR x 20  TKE GTB 10 x 10 second holds Step up 4 inch step 1 x 10 with 1 HHA Prone quad stretch with strap 10  x 5 second holds Supine: manual retrograde massage for edema control  03/05/22 Prone knee hang x 1 min Seated: LAQ Standing heel raise 15x 5"  Toe raises  TKE with GTB 10x 5"  Squats with HHA front of chair for mechanics STS no HHA 10x  Supine: manual retrograde massage for edema control Scar tissue massage superior incision when steri strips have fallen off Heel slides AROM 8-103 degrees  03/03/22 Standing:  Rockerboard lateral with cueing to stand tall to reduce leaning. Heel raises 10x 5" TKE with RTB 10x 5"  Sitting: STS no HHA LAQ 10x5"  Supine: quad sets 10x 5" SAQ 10x 5" Bridge 10x 5" SLR with quad set prior raise AROM 10 degrees-- 103 AAROM    PATIENT EDUCATION:  Education details: 03/17/22: reassessment, HEP; Eval: Patient educated on exam findings, POC, scope of PT, HEP. Person educated: Patient Education method: Explanation, Demonstration, and Handouts Education comprehension: verbalized understanding, returned demonstration, verbal cues required, and tactile cues required   HOME  EXERCISE PROGRAM: Access Code: [email or web address] URL: [email or web address] Date: 02/20/2022 Prepared by: [facility] 03/05/22: prone knee hang, heel and toe raises Exercises - Supine Ankle Pumps  - 3 x daily - 7 x weekly - 1 sets - 10 reps - Supine Quad Set  - 3 x daily - 7 x weekly - 1 sets - 10 reps - Supine Straight Leg Hip Adduction and Quad Set with Ball  - 3 x daily - 7 x weekly - 1 sets - 10 reps - Supine Heel Slides  - 3 x daily - 7 x weekly - 1 sets - 10 reps - Supine Active Straight Leg Raise  - 3 x daily - 7 x weekly - 1 sets - 10 reps - Supine Hip Abduction  - 3 x daily - 7 x weekly - 1 sets - 10 reps - Seated Knee Flexion Extension AROM   - 3 x daily - 7 x weekly - 1 sets - 10 reps - Seated Knee Flexion AAROM  - 3 x daily - 7 x weekly - 1 sets - 10 reps - Seated Long Arc Quad  - 3 x daily - 7 x weekly - 1 sets - 10 reps - Supine Knee Extension Stretch on Towel Roll  - 1 x daily - 7 x weekly - 1 sets - 1-5 min hold  03/05/22:  ASSESSMENT:  CLINICAL IMPRESSION: Patient able to walk down to treatment in the gym; he still has a limp; therapist encourages him to use his cane, he left it in the car. Progress note today; good improvement with flexion but continues with lag. Has not yet met goal for extension and strength but progressing well.  Patient will continue to benefit from skilled physical therapy in order to improve function and reduce impairment.    OBJECTIVE IMPAIRMENTS: Abnormal gait, decreased activity tolerance, decreased balance, decreased endurance, decreased mobility, difficulty walking, decreased ROM, hypomobility, increased edema, increased fascial restrictions, impaired perceived functional ability, increased muscle spasms, impaired flexibility, and pain.   ACTIVITY LIMITATIONS: carrying, lifting, bending, sitting, standing, squatting, sleeping, stairs, transfers, bed mobility, locomotion level, and caring for others  PARTICIPATION LIMITATIONS: meal prep,  cleaning, laundry, driving, shopping, and community activity   REHAB POTENTIAL: Good  CLINICAL DECISION MAKING: Stable/uncomplicated  EVALUATION COMPLEXITY: Low   GOALS: Goals reviewed with patient? No  SHORT TERM GOALS: Target date: 03/06/2022  Patient will be independent with initial HEP  Baseline: Goal status: MET  2.  Patient will increase  left knee AROM in supine to -8 to 100 to improve ability to navigate steps to enter home. Baseline: see above Goal status: MET    LONG TERM GOALS: Target date: 03/20/2022   Patient will be independent in self management strategies to improve quality of life and functional outcomes.  Baseline:  Goal status: IN PROGRESS  2.  Patient will increase left leg MMTs to 5/5 without pain to promote return to ambulation community distances with minimal deviation.  Baseline: see above Goal status: IN PROGRESS  3.  Patient will improve FOTO score to predicted value  Baseline: 44 03/17/22: 41% function Goal status: IN PROGRESS  4.  Patient will increase left knee AROM in supine to -2 to 120 to perform all household tasks and care for his mother without issue. Baseline: 03/30/22 -10 to 110 Goal status: IN PROGRESS  5.  Patient will perform 5 x sit to stand in 20 sec or less to demonstrate good functional mobility and increased lower extremity strength.  Baseline: 02/26/22: 5STS 14.98" initially required HHA for 3 reps then able to complete without HHA for last 2, decreased eccentric control 03/17/22 10.98 seconds with use of hands on first rep, relies on momentum Goal status: MET   PLAN:  PT FREQUENCY: 2x/week  PT DURATION: 4 weeks  PLANNED INTERVENTIONS: Therapeutic exercises, Therapeutic activity, Neuromuscular re-education, Balance training, Gait training, Patient/Family education, Joint manipulation, Joint mobilization, Stair training, Orthotic/Fit training, DME instructions, Aquatic Therapy, Dry Needling, Electrical stimulation,  Spinal manipulation, Spinal mobilization, Cryotherapy, Moist heat, Compression bandaging, scar mobilization, Splintting, Taping, Traction, Ultrasound, Ionotophoresis 56m/ml Dexamethasone, and Manual therapy  PLAN FOR NEXT SESSION: Focus on quad strengthening and knee mobility. Gait training with LRAD as appropriate. continue slant board, prone quad stretch and contract relax for flexion, continue quad strengthening to improve extension.  Reassess next visit  2:29 PM, 03/30/22 Skyeler Scalese Small Tyronne Blann MPT [place] [facility] [place] #[phone]

## 2022-03-31 ENCOUNTER — Encounter: Payer: Self-pay | Admitting: Internal Medicine

## 2022-03-31 ENCOUNTER — Ambulatory Visit (INDEPENDENT_AMBULATORY_CARE_PROVIDER_SITE_OTHER): Payer: Medicare Other | Admitting: Internal Medicine

## 2022-03-31 VITALS — BP 156/96 | HR 72 | Ht 72.0 in | Wt 211.6 lb

## 2022-03-31 DIAGNOSIS — I5022 Chronic systolic (congestive) heart failure: Secondary | ICD-10-CM | POA: Diagnosis not present

## 2022-03-31 DIAGNOSIS — Z122 Encounter for screening for malignant neoplasm of respiratory organs: Secondary | ICD-10-CM | POA: Diagnosis not present

## 2022-03-31 DIAGNOSIS — M170 Bilateral primary osteoarthritis of knee: Secondary | ICD-10-CM | POA: Diagnosis not present

## 2022-03-31 DIAGNOSIS — G894 Chronic pain syndrome: Secondary | ICD-10-CM | POA: Diagnosis not present

## 2022-03-31 DIAGNOSIS — I10 Essential (primary) hypertension: Secondary | ICD-10-CM | POA: Diagnosis not present

## 2022-03-31 DIAGNOSIS — E1121 Type 2 diabetes mellitus with diabetic nephropathy: Secondary | ICD-10-CM | POA: Diagnosis not present

## 2022-03-31 DIAGNOSIS — N184 Chronic kidney disease, stage 4 (severe): Secondary | ICD-10-CM

## 2022-03-31 DIAGNOSIS — Z96652 Presence of left artificial knee joint: Secondary | ICD-10-CM | POA: Diagnosis not present

## 2022-03-31 MED ORDER — CARVEDILOL 25 MG PO TABS: 25.0000 mg | ORAL_TABLET | Freq: Two times a day (BID) | ORAL | 1 refills | Status: DC

## 2022-03-31 MED ORDER — OXYCODONE-ACETAMINOPHEN 10-325 MG PO TABS: 1.0000 | ORAL_TABLET | Freq: Two times a day (BID) | ORAL | 0 refills | Status: DC | PRN

## 2022-03-31 NOTE — Patient Instructions (Signed)
Please start taking Carvedilol 25 mg twice daily.  Please start taking Percocet as needed for severe pain.  Please continue to follow low salt diet and ambulate as tolerated.

## 2022-03-31 NOTE — Assessment & Plan Note (Signed)
Lab Results  Component Value Date   HGBA1C 5.8 (H) 01/23/2022   Diet controlled On statin

## 2022-03-31 NOTE — Assessment & Plan Note (Signed)
BP Readings from Last 1 Encounters:  03/31/22 (!) 156/96   Uncontrolled, followed by Nephrology Increased dose of Coreg to 25 mg twice daily Counseled for compliance with the medications Advised DASH diet and ambulation as tolerated

## 2022-03-31 NOTE — Assessment & Plan Note (Signed)
Progressive CKD stage 4 Follows up with Nephrology On Sensipar and sodium bicarb tablets Avoid nephrotoxic agents, including NSAIDs

## 2022-03-31 NOTE — Assessment & Plan Note (Signed)
S/p left TKA Has seen Orthopedic surgery, planning to get right TKA Has had steroid injection in the past Unable to take NSAIDs due to CKD and CAD Was on Percocet currently, refilled for now, advised to take it BID PRN and avoid TID dose

## 2022-03-31 NOTE — Progress Notes (Signed)
Established Patient Office Visit  Subjective:  Patient ID: [name], male    DOB: Jul 03, 1955  Age: 66 y.o. MRN: [id number]  CC:  Chief Complaint  Patient presents with   Follow-up    Follow up post L knee surgery x 6 weeks ago still in pain      HPI [name] is a 66 y.o. male with past medical history of CAD s/p CABG, ischemic cardiomyopathy, CKD and OA who presents for f/u of his chronic medical conditions.  He had left TKA for OA of left knee.  He was given oxycodone 10 mg initially, but dose was recently decreased to 5 mg and he complains of severe knee pain now.  He is planned to get right TKA for OA of knee.  He was taking Percocet 10-325 mg as needed for severe knee pain.  He is not able to take oral NSAIDs due to his history of CKD and CAD.  He is currently on DAPT and statin for CAD. BP is uncontrolled. Takes medications regularly. Patient denies headache, dizziness, chest pain, dyspnea or palpitations.    Past Medical History:  Diagnosis Date   Acute ST elevation myocardial infarction (STEMI) due to occlusion of mid portion of left anterior descending (LAD) coronary artery ([place]) 47/82/9562   Acute systolic heart failure (HCC)    AICD (automatic cardioverter/defibrillator) present    Arthritis    CHF (congestive heart failure) (HCC)    CKD (chronic kidney disease) stage 4, GFR 15-29 ml/min (HCC)    Diabetes mellitus, type II ([place])    Dyspnea    with exertionm periodically   Heart attack ([place]) 03/15/2019   anterior MI    History of kidney stones    Hyperparathyroidism ([place])    Hypertension 2002   Ischemic cardiomyopathy    Left knee pain    Syncope 04/04/2019   after blood draw   Vitamin D deficiency     Past Surgical History:  Procedure Laterality Date   AMPUTATION TOE Left 1991   2 digit of left foot   BIOPSY  02/26/2021   Procedure: BIOPSY;  Surgeon: Rogene Houston, MD;  [facility];  Service: Endoscopy;;   CHOLECYSTECTOMY      COLONOSCOPY     COLONOSCOPY WITH PROPOFOL N/A 02/26/2021   Procedure: COLONOSCOPY WITH PROPOFOL;  Surgeon: Rogene Houston, MD;  [facility];  Service: Endoscopy;  Laterality: N/A;   CORONARY/GRAFT ACUTE MI REVASCULARIZATION N/A 03/12/2019   Procedure: Coronary/Graft Acute MI Revascularization;  Surgeon: Belva Crome, MD;  Location: [facility];  Service: Cardiovascular;  Laterality: N/A;   ESOPHAGOGASTRODUODENOSCOPY (EGD) WITH PROPOFOL N/A 02/26/2021   Procedure: ESOPHAGOGASTRODUODENOSCOPY (EGD) WITH PROPOFOL;  Surgeon: Rogene Houston, MD;  [facility];  Service: Endoscopy;  Laterality: N/A;   ICD IMPLANT N/A 08/24/2019   Procedure: ICD IMPLANT;  Surgeon: Evans Lance, MD;  Location: [facility];  Service: Cardiovascular;  Laterality: N/A;   IR URETERAL STENT LEFT NEW ACCESS W/O SEP NEPHROSTOMY CATH  09/26/2019   LEFT HEART CATH AND CORONARY ANGIOGRAPHY N/A 03/12/2019   Procedure: LEFT HEART CATH AND CORONARY ANGIOGRAPHY;  Surgeon: Belva Crome, MD;  Location: [facility];  Service: Cardiovascular;  Laterality: N/A;   LITHOTRIPSY Right    NEPHROLITHOTOMY Left 09/26/2019   Procedure: LEFT NEPHROLITHOTOMY PERCUTANEOUS;  Surgeon: Irine Seal, MD;  Location: [facility];  Service: Urology;  Laterality: Left;   TOTAL KNEE ARTHROPLASTY Left 02/02/2022  Procedure: TOTAL KNEE ARTHROPLASTY;  Surgeon: Vickey Huger, MD;  Location: [facility];  Service: Orthopedics;  Laterality: Left;    Family History  Problem Relation Age of Onset   Heart failure Mother    Heart disease Mother    Cancer Mother        breast   Heart attack Father    Hypertension Father    Heart disease Father    Diabetes Brother     Social History   Socioeconomic History   Marital status: Single    Spouse name: Not on file   Number of children: 0   Years of education: Not on file   Highest education level: Some college, no degree  Occupational History   Occupation: retired   Tobacco Use   Smoking status: Former    Packs/day: 0.50    Years: 40.00    Total pack years: 20.00    Types: Cigarettes    Quit date: 03/12/2019    Years since quitting: 3.0   Smokeless tobacco: Never  Vaping Use   Vaping Use: Never used  Substance and Sexual Activity   Alcohol use: No   Drug use: No   Sexual activity: Not Currently  Other Topics Concern   Not on file  Social History Narrative   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       Enjoys: fishing, shopping, working around the house       Diet: working on changes, avoiding fried foods, increased veggies   Caffeine: teas some daily   Water: 6-8 cups daily       Wears seat belt    Does not use phone while driving    Oceanographer at home    Social Determinants of Health   Financial Resource Strain: [place]  (01/19/2022)   Overall Financial Resource Strain (CARDIA)    Difficulty of Paying Living Expenses: Not hard at all  Food Insecurity: Unknown (02/02/2022)   Hunger Vital Sign    Worried About [place] in the Last Year: Patient refused    [place] in the Last Year: Patient refused  Transportation Needs: No Transportation Needs (02/02/2022)   PRAPARE - Hydrologist (Medical): No    Lack of Transportation (Non-Medical): No  Physical Activity: Sufficiently Active (01/19/2022)   Exercise Vital Sign    Days of Exercise per Week: 7 days    Minutes of Exercise per Session: 30 min  Stress: No Stress Concern Present (01/19/2022)   [place]    Feeling of Stress : Not at all  Social Connections: Moderately Isolated (01/19/2022)   Social Connection and Isolation Panel [NHANES]    Frequency of Communication with Friends and Family: More than three times a week    Frequency of Social Gatherings with Friends and Family: More than three times a week    Attends Religious Services: More than 4 times per year     Active Member of [employer] or Organizations: No    Attends Archivist Meetings: Never    Marital Status: Never married  Intimate Partner Violence: Not At Risk (02/02/2022)   Humiliation, Afraid, Rape, and Kick questionnaire    Fear of Current or Ex-Partner: No    Emotionally Abused: No    Physically Abused: No    Sexually Abused: No    Outpatient Medications Prior to Visit  Medication Sig Dispense Refill   allopurinol ([place])  100 MG tablet Take 100 mg by mouth daily.      aspirin 81 MG chewable tablet Chew 1 tablet (81 mg total) by mouth 2 (two) times daily. 60 tablet 0   atorvastatin (LIPITOR) 80 MG tablet TAKE 1 TABLET BY MOUTH DAILY AT 6PM 90 tablet 0   BRILINTA 60 MG TABS tablet TAKE (1) TABLET BY MOUTH TWICE DAILY. 180 tablet 3   Cholecalciferol (VITAMIN D) 50 MCG (2000 UT) CAPS Take 2,000 Units by mouth daily with breakfast.     cinacalcet (SENSIPAR) 30 MG tablet Take 30 mg by mouth daily.     CORLANOR 5 MG TABS tablet TAKE 1 TABLET BY MOUTH TWICE DAILY WITH A MEAL 180 tablet 2   diclofenac Sodium (VOLTAREN) 1 % GEL Apply 2 g topically 4 (four) times daily. 50 g 0   furosemide (LASIX) 40 MG tablet Take 40 mg by mouth daily as needed for fluid.     lidocaine (XYLOCAINE) 2 % solution Use as directed 10 mLs in the mouth or throat every 3 (three) hours as needed for mouth pain. 100 mL 0   methocarbamol (ROBAXIN) 500 MG tablet Take 1-2 tablets (500-1,000 mg total) by mouth every 6 (six) hours as needed for muscle spasms. 60 tablet 0   nitroGLYCERIN (NITROSTAT) 0.4 MG SL tablet Place 1 tablet (0.4 mg total) under the tongue every 5 (five) minutes as needed. 25 tablet 2   pantoprazole (PROTONIX) 40 MG tablet Take 1 tablet (40 mg total) by mouth 2 (two) times daily before a meal. 60 tablet 11   sodium bicarbonate 650 MG tablet Take 650 mg by mouth 2 (two) times daily.     carvedilol (COREG) 12.5 MG tablet Take 1 tablet (12.5 mg total) by mouth 2 (two) times daily with a meal. 180  tablet 3   amoxicillin-clavulanate (AUGMENTIN) 875-125 MG tablet Take 1 tablet by mouth every 12 (twelve) hours. (Patient not taking: Reported on 01/20/2022) 14 tablet 0   chlorhexidine (PERIDEX) 0.12 % solution Use as directed 15 mLs in the mouth or throat 2 (two) times daily. (Patient not taking: Reported on 01/20/2022) 120 mL 0   oxyCODONE-acetaminophen (PERCOCET) 10-325 MG tablet TAKE 1 TABLET BY MOUTH EVERY 12 HOURS AS NEEDED FOR PAIN. 58 tablet 0   No facility-administered medications prior to visit.    No Known Allergies  ROS Review of Systems  Constitutional:  Negative for chills and fever.  HENT:  Negative for ear discharge and sore throat.   Eyes:  Negative for pain and discharge.  Respiratory:  Negative for cough and shortness of breath.   Cardiovascular:  Negative for chest pain and palpitations.  Gastrointestinal:  Negative for constipation, diarrhea, nausea and vomiting.  Endocrine: Negative for polydipsia and polyuria.  Genitourinary:  Negative for dysuria and hematuria.  Musculoskeletal:  Positive for arthralgias and back pain. Negative for neck pain and neck stiffness.  Skin:  Negative for rash.  Neurological:  Negative for dizziness, weakness and numbness.  Psychiatric/Behavioral:  Negative for agitation and behavioral problems.       Objective:    Physical Exam Vitals reviewed.  Constitutional:      General: He is not in acute distress.    Appearance: He is not diaphoretic.  HENT:     Head: Normocephalic and atraumatic.     Right Ear: There is no impacted cerumen.     Nose: Nose normal.     Mouth/Throat:     Mouth: Mucous membranes are moist.  Eyes:     General: No scleral icterus.    Extraocular Movements: Extraocular movements intact.  Cardiovascular:     Rate and Rhythm: Normal rate and regular rhythm.     Pulses: Normal pulses.     Heart sounds: Normal heart sounds. No murmur heard. Pulmonary:     Breath sounds: Normal breath sounds. No wheezing or  rales.  Abdominal:     Palpations: Abdomen is soft.     Tenderness: There is no abdominal tenderness.  Musculoskeletal:        General: Tenderness (B/l knee, with mild swelling) present.     Cervical back: Neck supple. No tenderness.     Right lower leg: No edema.     Left lower leg: No edema.  Skin:    General: Skin is warm.     Findings: No rash.  Neurological:     General: No focal deficit present.     Mental Status: He is alert and oriented to person, place, and time.     Cranial Nerves: No cranial nerve deficit.     Sensory: No sensory deficit.     Motor: No weakness.  Psychiatric:        Mood and Affect: Mood normal.        Behavior: Behavior normal.     BP (!) 156/96 (BP Location: Left Arm, Cuff Size: Normal)   Pulse 72   Ht 6' (1.829 m)   Wt 211 lb 9.6 oz (96 kg)   SpO2 95%   BMI 28.70 kg/m  Wt Readings from Last 3 Encounters:  03/31/22 211 lb 9.6 oz (96 kg)  02/02/22 211 lb (95.7 kg)  01/23/22 211 lb (95.7 kg)    Lab Results  Component Value Date   TSH 0.993 08/28/2020   Lab Results  Component Value Date   WBC 4.7 02/02/2022   HGB 11.6 (L) 02/02/2022   HCT 35.1 (L) 02/02/2022   MCV 95.1 02/02/2022   PLT 163 02/02/2022   Lab Results  Component Value Date   NA 140 02/02/2022   K 4.3 02/02/2022   CO2 22 02/02/2022   GLUCOSE 105 (H) 02/02/2022   BUN 29 (H) 02/02/2022   CREATININE 2.86 (H) 02/02/2022   BILITOT 1.4 (H) 02/02/2022   ALKPHOS 95 02/02/2022   AST 19 02/02/2022   ALT 15 02/02/2022   PROT 7.0 02/02/2022   ALBUMIN 3.3 (L) 02/02/2022   CALCIUM 10.5 (H) 02/02/2022   ANIONGAP 5 02/02/2022   EGFR 26 (L) 08/28/2020   Lab Results  Component Value Date   CHOL 126 08/28/2020   Lab Results  Component Value Date   HDL 52 08/28/2020   Lab Results  Component Value Date   LDLCALC 53 08/28/2020   Lab Results  Component Value Date   TRIG 118 08/28/2020   Lab Results  Component Value Date   CHOLHDL 2.4 08/28/2020   Lab Results   Component Value Date   HGBA1C 5.8 (H) 01/23/2022      Assessment & Plan:   Problem List Items Addressed This Visit       Cardiovascular and Mediastinum   Essential hypertension - Primary    BP Readings from Last 1 Encounters:  03/31/22 (!) 156/96  Uncontrolled, followed by Nephrology Increased dose of Coreg to 25 mg twice daily Counseled for compliance with the medications Advised DASH diet and ambulation as tolerated      Relevant Medications   carvedilol (COREG) 25 MG tablet   Congestive heart failure (  [place])    Appears euvolemic On Coreg, Corlanor and Lasix On Aspirin and statin for CAD F/u with Cardiology      Relevant Medications   carvedilol (COREG) 25 MG tablet     Endocrine   Type 2 diabetes with nephropathy (HCC)    Lab Results  Component Value Date   HGBA1C 5.8 (H) 01/23/2022  Diet controlled On statin        Musculoskeletal and Integument   Primary osteoarthritis of both knees    S/p left TKA Has seen Orthopedic surgery, planning to get right TKA Has had steroid injection in the past Unable to take NSAIDs due to CKD and CAD Was on Percocet currently, refilled for now, advised to take it BID PRN and avoid TID dose      Relevant Medications   oxyCODONE-acetaminophen (PERCOCET) 10-325 MG tablet     Genitourinary   Chronic kidney disease, stage IV (severe) (HCC)    Progressive CKD stage 4 Follows up with Nephrology On Sensipar and sodium bicarb tablets Avoid nephrotoxic agents, including NSAIDs        Other   S/P total knee replacement   Other Visit Diagnoses     Chronic pain syndrome       Relevant Medications   oxyCODONE-acetaminophen (PERCOCET) 10-325 MG tablet   Screening for lung cancer       Relevant Orders   CT CHEST LUNG CANCER SCREENING LOW DOSE WO CONTRAST       Meds ordered this encounter  Medications   oxyCODONE-acetaminophen (PERCOCET) 10-325 MG tablet    Sig: Take 1 tablet by mouth every 12 (twelve) hours as needed.  for pain    Dispense:  60 tablet    Refill:  0   carvedilol (COREG) 25 MG tablet    Sig: Take 1 tablet (25 mg total) by mouth 2 (two) times daily with a meal.    Dispense:  180 tablet    Refill:  1    Follow-up: Return in about 2 months (around 05/31/2022) for HTN and chronic pain.    Lindell Spar, MD

## 2022-03-31 NOTE — Assessment & Plan Note (Addendum)
Appears euvolemic On Coreg, Corlanor and Lasix On Aspirin and statin for CAD F/u with Cardiology

## 2022-04-03 ENCOUNTER — Ambulatory Visit (HOSPITAL_COMMUNITY): Payer: Medicare Other | Attending: Orthopedic Surgery

## 2022-04-03 ENCOUNTER — Telehealth: Payer: Self-pay | Admitting: *Deleted

## 2022-04-03 ENCOUNTER — Encounter: Payer: Self-pay | Admitting: *Deleted

## 2022-04-03 DIAGNOSIS — M1712 Unilateral primary osteoarthritis, left knee: Secondary | ICD-10-CM

## 2022-04-03 DIAGNOSIS — R262 Difficulty in walking, not elsewhere classified: Secondary | ICD-10-CM

## 2022-04-03 NOTE — Patient Outreach (Signed)
  Care Coordination   Initial Visit Note   04/03/2022 Name: Donzel Romack MRN: [id number] DOB: [birth year]  Caelum Federici is a 66 y.o. year old male who sees Lindell Spar, MD for primary care. I spoke with  [name] by phone today.  What matters to the patients health and wellness today?  UNKNOWN PT UNABLE TO COMPLETE ASSESSMENT.   SDOH assessments and interventions completed:  Yes  SDOH Interventions Today    Flowsheet Row Most Recent Value  SDOH Interventions   Food Insecurity Interventions Intervention Not Indicated        Care Coordination Interventions:  No, not indicated   Follow up plan: Follow up call scheduled for ANOTHER DAY.    Encounter Outcome:  Pt. Request to Call Back   Yoseline Andersson C. Myrtie Neither, MSN, [facility] Gerontological Nurse Practitioner [facility] Care Management [phone]

## 2022-04-03 NOTE — Therapy (Signed)
OUTPATIENT PHYSICAL THERAPY LOWER EXTREMITY Treatment     Patient Name: [name] MRN: [id number] DOB:1955-10-21, 66 y.o., male Today's Date: 04/03/2022    PT End of Session - 04/03/22 0904     Visit Number 11    Number of Visits 16    Date for PT Re-Evaluation 04/14/22    Authorization Type UHC Medicare; no copay, 20% co insurance; no dec, no VL, no auth required    Progress Note Due on Visit 18    PT Start Time 0900    PT Stop Time 0940    PT Time Calculation (min) 40 min    Activity Tolerance Patient tolerated treatment well;No increased pain;Patient limited by fatigue    Behavior During Therapy [facility] for tasks assessed/performed                 Past Medical History:  Diagnosis Date   Acute ST elevation myocardial infarction (STEMI) due to occlusion of mid portion of left anterior descending (LAD) coronary artery ([place]) 35/45/6256   Acute systolic heart failure (HCC)    AICD (automatic cardioverter/defibrillator) present    Arthritis    CHF (congestive heart failure) (HCC)    CKD (chronic kidney disease) stage 4, GFR 15-29 ml/min (HCC)    Diabetes mellitus, type II ([place])    Dyspnea    with exertionm periodically   Heart attack ([place]) 03/15/2019   anterior MI    History of kidney stones    Hyperparathyroidism ([place])    Hypertension 2002   Ischemic cardiomyopathy    Left knee pain    Syncope 04/04/2019   after blood draw   Vitamin D deficiency    Past Surgical History:  Procedure Laterality Date   AMPUTATION TOE Left 1991   2 digit of left foot   BIOPSY  02/26/2021   Procedure: BIOPSY;  Surgeon: Rogene Houston, MD;  [facility];  Service: Endoscopy;;   CHOLECYSTECTOMY     COLONOSCOPY     COLONOSCOPY WITH PROPOFOL N/A 02/26/2021   Procedure: COLONOSCOPY WITH PROPOFOL;  Surgeon: Rogene Houston, MD;  [facility];  Service: Endoscopy;  Laterality: N/A;   CORONARY/GRAFT ACUTE MI REVASCULARIZATION N/A 03/12/2019   Procedure:  Coronary/Graft Acute MI Revascularization;  Surgeon: Belva Crome, MD;  Location: [place] CV LAB;  Service: Cardiovascular;  Laterality: N/A;   ESOPHAGOGASTRODUODENOSCOPY (EGD) WITH PROPOFOL N/A 02/26/2021   Procedure: ESOPHAGOGASTRODUODENOSCOPY (EGD) WITH PROPOFOL;  Surgeon: Rogene Houston, MD;  [facility];  Service: Endoscopy;  Laterality: N/A;   ICD IMPLANT N/A 08/24/2019   Procedure: ICD IMPLANT;  Surgeon: Evans Lance, MD;  Location: [place] CV LAB;  Service: Cardiovascular;  Laterality: N/A;   IR URETERAL STENT LEFT NEW ACCESS W/O SEP NEPHROSTOMY CATH  09/26/2019   LEFT HEART CATH AND CORONARY ANGIOGRAPHY N/A 03/12/2019   Procedure: LEFT HEART CATH AND CORONARY ANGIOGRAPHY;  Surgeon: Belva Crome, MD;  Location: [place] [facility];  Service: Cardiovascular;  Laterality: N/A;   LITHOTRIPSY Right    NEPHROLITHOTOMY Left 09/26/2019   Procedure: LEFT NEPHROLITHOTOMY PERCUTANEOUS;  Surgeon: Irine Seal, MD;  Location: [facility];  Service: Urology;  Laterality: Left;   TOTAL KNEE ARTHROPLASTY Left 02/02/2022   Procedure: TOTAL KNEE ARTHROPLASTY;  Surgeon: Vickey Huger, MD;  Location: [facility];  Service: Orthopedics;  Laterality: Left;   Patient Active Problem List   Diagnosis Date Noted   S/P total knee replacement 02/02/2022   Encounter for general adult medical  examination with abnormal findings 09/17/2021   Other hyperparathyroidism ([place]) 06/13/2021   Vitamin D deficiency 06/13/2021   Nonintractable episodic headache 04/23/2021   Primary osteoarthritis of both knees 04/23/2021   Anemia due to chronic blood loss 03/05/2021   Gastric ulcer 03/05/2021   Type 2 diabetes with nephropathy ([place])    LUQ pain    Prediabetes 02/22/2021   Left renal stone 09/26/2019   CAD in native artery    Ischemic cardiomyopathy 08/15/2019   Congestive heart failure ([place]) 03/21/2019   ST elevation myocardial infarction involving left anterior descending (LAD) coronary artery (HCC)     Chronic kidney disease, stage IV (severe) ([place]) 06/11/2016   Elevated PSA 06/11/2016   Hyperlipidemia 06/11/2016   Essential hypertension 05/28/2016     PCP: Ihor Dow MD  REFERRING PROVIDER: Vickey Huger, MD Next apt 03/19/22  REFERRING DIAG: PT eval/tx for s/p lt TKA per Vickey Huger, MD  THERAPY DIAG:  Primary osteoarthritis of left knee  Difficulty in walking, not elsewhere classified  Rationale for Evaluation and Treatment Rehabilitation  ONSET DATE: 02/05/2022  SUBJECTIVE:   SUBJECTIVE STATEMENT: Left knee is stiff  PERTINENT HISTORY: 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 Yes: NPRS scale: 5/10 Pain location: left knee Pain description: sore, aching, tightness Aggravating factors: movement Relieving factors: rest, medicine  PRECAUTIONS: None  WEIGHT BEARING RESTRICTIONS: No  FALLS:  Has patient fallen in last 6 months? No  LIVING ENVIRONMENT: Lives with: lives with their family Lives in: House/apartment Stairs: Yes: External: 2 steps; none Has following equipment at home: Walker - 2 wheeled, Wheelchair (manual), and bed side commode  OCCUPATION:   PLOF: Independent  PATIENT GOALS: walk   OBJECTIVE:   DIAGNOSTIC FINDINGS: none  PATIENT SURVEYS:  FOTO 44  03/17/22 41% function  COGNITION: Overall cognitive status: Within functional limits for tasks assessed     SENSATION: WFL  EDEMA:  Normal for this time s/p     PALPATION: Tenderness normal for this time s/p  LOWER EXTREMITY ROM:  Active ROM Right eval Left eval Left 02/23/22 Left 02/26/22 Left 03/05/02 Left 03/10/22 Left  03/12/22 Left 03/17/22 Left 03/30/22  Hip flexion           Hip extension           Hip abduction           Hip adduction           Hip internal rotation           Hip external rotation           Knee flexion  86 96 98 103 105 107 107 110  Knee extension  -12 -_0 Lacking 7 Lacking 5 Lacking 7 -10  (lacking)  Ankle dorsiflexion           Ankle plantarflexion           Ankle inversion           Ankle eversion            (Blank rows = not tested)  LOWER EXTREMITY MMT:  MMT Right eval Left eval Right 03/17/22 Left 03/17/22 Left 03/30/22  Hip flexion   5 5   Hip extension       Hip abduction       Hip adduction       Hip internal rotation       Hip external rotation  Knee flexion  3- (sitting) 5 4+ 4+  Knee extension  Poor to fair quad set 5 4+ 5  Ankle dorsiflexion   _0 Ankle plantarflexion       Ankle inversion       Ankle eversion        (Blank rows = not tested)   FUNCTIONAL TESTS:  5 times sit to stand: 02/26/22: 5STS 14.98" initially required HHA for 3 reps then able to complete without HHA for last 2, decreased eccentric contro  GAIT: Distance walked: 10 ft Assistive device utilized: Environmental consultant - 2 wheeled Level of assistance: CGA Comments: decreased heel strike and toe off; extension lag left knee; antalgic and step to gait pattern Reassessment 03/17/22: 5 times sit to stand: 10.98 seconds with use of hands on first rep, relies on momentum FOTO: 41% function  TODAY'S TREATMENT                                                                                                                             04/03/22 Supine: Quad sets 5" hold x 10 SAQ's 2 x 10 Manual knee flexion and extension with PT overpressure x 10  RB seat 10 x 5' full revolutions  Backwards walking on TM x 3 sets for 1"- cues for TKE, BUE support, and L knee flexion. (Noted L knee circumduction to achieve appropriate arc of motion)  TKE's BTB x 20 Heel/toe raises x 20 Slant board 5 x 20"      03/30/22 Progress note AROM Left knee -10 to 110   Supine: Quad sets 5" x 10  Seated: Heel/toe raises x 20 Marching x 20 LAQ's 2 x 10 Manual knee flexion stretch x 8 Hamstring stretch with strap 10 x 10"  Prone hang x 2'  RB seat 10 x 5'  Ambulation down to gym and back to  front of hospital without AD (encouraged patient to bring cane)    03/24/22 Supine: Quad sets Left knee 5" x 10 STM to left knee to decrease pain and increase tissue mobility, Manual knee extension x 5  Sitting: manual knee flexion x 5  RB Precor seat 10 x 5' 1/2 revolutions  Standing: Heel/toe raises 2 x 10 Slant board 5 x 20" Knee drives on 8" box x 2 minutes Hamstring stretch with leg on 8" box 5 x 20" squat 2 x 10       03/17/22 Reassessment Step up 4 inch 1 x 10, 6 inch 2 x 15 Lateral step up 6 inch 1x 15   Squat 1 x 10   11//9/23 Supine: manual retrograde massage for edema control with LE on green ball for elevation Quad set 10 x 10 second holds Supine heel slides with belt 10 x 10 second holds Supine hamstring sets with green ball 10 x 10 second holds Step up 4 inch 2x 10 with 1 HHA Lateral step up 4 inch with 2 HHA 1 x 10  03/10/22 Supine heel slides with belt 10 x 10 second holds Quad set 10 x 10 second holds Calf stretch on slant board 4x 20 second holds HR x 20 TR x 20  TKE GTB 10 x 10 second holds Step up 4 inch step 1 x 10 with 1 HHA Prone quad stretch with strap 10 x 5 second holds Supine: manual retrograde massage for edema control  03/05/22 Prone knee hang x 1 min Seated: LAQ Standing heel raise 15x 5"  Toe raises  TKE with GTB 10x 5"  Squats with HHA front of chair for mechanics STS no HHA 10x  Supine: manual retrograde massage for edema control Scar tissue massage superior incision when steri strips have fallen off Heel slides AROM 8-103 degrees  03/03/22 Standing:  Rockerboard lateral with cueing to stand tall to reduce leaning. Heel raises 10x 5" TKE with RTB 10x 5"  Sitting: STS no HHA LAQ 10x5"  Supine: quad sets 10x 5" SAQ 10x 5" Bridge 10x 5" SLR with quad set prior raise AROM 10 degrees-- 103 AAROM    PATIENT EDUCATION:  Education details: 03/17/22: reassessment, HEP; Eval: Patient educated on exam findings, POC,  scope of PT, HEP. Person educated: Patient Education method: Explanation, Demonstration, and Handouts Education comprehension: verbalized understanding, returned demonstration, verbal cues required, and tactile cues required   HOME EXERCISE PROGRAM: Access Code: [email or web address] URL: [email or web address] Date: 02/20/2022 Prepared by: [facility] 03/05/22: prone knee hang, heel and toe raises Exercises - Supine Ankle Pumps  - 3 x daily - 7 x weekly - 1 sets - 10 reps - Supine Quad Set  - 3 x daily - 7 x weekly - 1 sets - 10 reps - Supine Straight Leg Hip Adduction and Quad Set with Ball  - 3 x daily - 7 x weekly - 1 sets - 10 reps - Supine Heel Slides  - 3 x daily - 7 x weekly - 1 sets - 10 reps - Supine Active Straight Leg Raise  - 3 x daily - 7 x weekly - 1 sets - 10 reps - Supine Hip Abduction  - 3 x daily - 7 x weekly - 1 sets - 10 reps - Seated Knee Flexion Extension AROM   - 3 x daily - 7 x weekly - 1 sets - 10 reps - Seated Knee Flexion AAROM  - 3 x daily - 7 x weekly - 1 sets - 10 reps - Seated Long Arc Quad  - 3 x daily - 7 x weekly - 1 sets - 10 reps - Supine Knee Extension Stretch on Towel Roll  - 1 x daily - 7 x weekly - 1 sets - 1-5 min hold  03/05/22:  ASSESSMENT:  CLINICAL IMPRESSION: Patient able to walk down to treatment in the gym; using a cane today; continues with extension lag but able to make full continuous revolutions on bike.   Patient will continue to benefit from skilled physical therapy in order to improve function and reduce impairment.    OBJECTIVE IMPAIRMENTS: Abnormal gait, decreased activity tolerance, decreased balance, decreased endurance, decreased mobility, difficulty walking, decreased ROM, hypomobility, increased edema, increased fascial restrictions, impaired perceived functional ability, increased muscle spasms, impaired flexibility, and pain.   ACTIVITY LIMITATIONS: carrying, lifting, bending, sitting, standing, squatting, sleeping,  stairs, transfers, bed mobility, locomotion level, and caring for others  PARTICIPATION LIMITATIONS: meal prep, cleaning, laundry, driving, shopping, and community activity   REHAB POTENTIAL: Good  CLINICAL DECISION MAKING: Stable/uncomplicated  EVALUATION COMPLEXITY:  Low   GOALS: Goals reviewed with patient? No  SHORT TERM GOALS: Target date: 03/06/2022  Patient will be independent with initial HEP  Baseline: Goal status: MET  2.  Patient will increase left knee AROM in supine to -8 to 100 to improve ability to navigate steps to enter home. Baseline: see above Goal status: MET    LONG TERM GOALS: Target date: 03/20/2022   Patient will be independent in self management strategies to improve quality of life and functional outcomes.  Baseline:  Goal status: IN PROGRESS  2.  Patient will increase left leg MMTs to 5/5 without pain to promote return to ambulation community distances with minimal deviation.  Baseline: see above Goal status: IN PROGRESS  3.  Patient will improve FOTO score to predicted value  Baseline: 44 03/17/22: 41% function Goal status: IN PROGRESS  4.  Patient will increase left knee AROM in supine to -2 to 120 to perform all household tasks and care for his mother without issue. Baseline: 03/30/22 -10 to 110 Goal status: IN PROGRESS  5.  Patient will perform 5 x sit to stand in 20 sec or less to demonstrate good functional mobility and increased lower extremity strength.  Baseline: 02/26/22: 5STS 14.98" initially required HHA for 3 reps then able to complete without HHA for last 2, decreased eccentric control 03/17/22 10.98 seconds with use of hands on first rep, relies on momentum Goal status: MET   PLAN:  PT FREQUENCY: 2x/week  PT DURATION: 4 weeks  PLANNED INTERVENTIONS: Therapeutic exercises, Therapeutic activity, Neuromuscular re-education, Balance training, Gait training, Patient/Family education, Joint manipulation, Joint mobilization,  Stair training, Orthotic/Fit training, DME instructions, Aquatic Therapy, Dry Needling, Electrical stimulation, Spinal manipulation, Spinal mobilization, Cryotherapy, Moist heat, Compression bandaging, scar mobilization, Splintting, Taping, Traction, Ultrasound, Ionotophoresis 70m/ml Dexamethasone, and Manual therapy  PLAN FOR NEXT SESSION: Focus on quad strengthening and knee mobility. Gait training with LRAD as appropriate. continue slant board, prone quad stretch and contract relax for flexion, continue quad strengthening to improve extension.    9:43 AM, 04/03/22 Aidee Latimore Small Breuna Loveall MPT [facility] [place] #[phone]

## 2022-04-07 ENCOUNTER — Encounter (HOSPITAL_COMMUNITY): Payer: Medicare Other

## 2022-04-13 ENCOUNTER — Ambulatory Visit (HOSPITAL_COMMUNITY): Payer: Medicare Other | Admitting: Physical Therapy

## 2022-04-13 ENCOUNTER — Encounter (HOSPITAL_COMMUNITY): Payer: Self-pay | Admitting: Physical Therapy

## 2022-04-13 DIAGNOSIS — M1712 Unilateral primary osteoarthritis, left knee: Secondary | ICD-10-CM

## 2022-04-13 DIAGNOSIS — R262 Difficulty in walking, not elsewhere classified: Secondary | ICD-10-CM | POA: Diagnosis not present

## 2022-04-13 NOTE — Therapy (Signed)
OUTPATIENT PHYSICAL THERAPY LOWER EXTREMITY Treatment     Patient Name: [name] MRN: [id number] DOB:26-Sep-1955, 66 y.o., male Today's Date: 04/13/2022  Progress Note   Reporting Period 03/17/22 to 04/13/22   See note below for Objective Data and Assessment of Progress/Goals    PT End of Session - 04/13/22 0941     Visit Number 12    Number of Visits 24    Date for PT Re-Evaluation 05/11/22    Authorization Type [employer]HC Medicare; no copay, 20% co insurance; no dec, no VL, no auth required    Progress Note Due on Visit 22    PT Start Time [phone]    PT Stop Time 1017    PT Time Calculation (min) 34 min    Activity Tolerance Patient tolerated treatment well;No increased pain;Patient limited by fatigue    Behavior During Therapy [facility] for tasks assessed/performed                 Past Medical History:  Diagnosis Date   Acute ST elevation myocardial infarction (STEMI) due to occlusion of mid portion of left anterior descending (LAD) coronary artery ([place]) 11/91/4782   Acute systolic heart failure (HCC)    AICD (automatic cardioverter/defibrillator) present    Arthritis    CHF (congestive heart failure) (HCC)    CKD (chronic kidney disease) stage 4, GFR 15-29 ml/min (HCC)    Diabetes mellitus, type II ([place])    Dyspnea    with exertionm periodically   Heart attack ([place]) 03/15/2019   anterior MI    History of kidney stones    Hyperparathyroidism ([place])    Hypertension 2002   Ischemic cardiomyopathy    Left knee pain    Syncope 04/04/2019   after blood draw   Vitamin D deficiency    Past Surgical History:  Procedure Laterality Date   AMPUTATION TOE Left 1991   2 digit of left foot   BIOPSY  02/26/2021   Procedure: BIOPSY;  Surgeon: Rogene Houston, MD;  [facility];  Service: Endoscopy;;   CHOLECYSTECTOMY     COLONOSCOPY     COLONOSCOPY WITH PROPOFOL N/A 02/26/2021   Procedure: COLONOSCOPY WITH PROPOFOL;  Surgeon: Rogene Houston, MD;  [facility];  Service: Endoscopy;  Laterality: N/A;   CORONARY/GRAFT ACUTE MI REVASCULARIZATION N/A 03/12/2019   Procedure: Coronary/Graft Acute MI Revascularization;  Surgeon: Belva Crome, MD;  Location: [place] [facility];  Service: Cardiovascular;  Laterality: N/A;   ESOPHAGOGASTRODUODENOSCOPY (EGD) WITH PROPOFOL N/A 02/26/2021   Procedure: ESOPHAGOGASTRODUODENOSCOPY (EGD) WITH PROPOFOL;  Surgeon: Rogene Houston, MD;  [facility];  Service: Endoscopy;  Laterality: N/A;   ICD IMPLANT N/A 08/24/2019   Procedure: ICD IMPLANT;  Surgeon: Evans Lance, MD;  Location: [place] [facility];  Service: Cardiovascular;  Laterality: N/A;   IR URETERAL STENT LEFT NEW ACCESS W/O SEP NEPHROSTOMY CATH  09/26/2019   LEFT HEART CATH AND CORONARY ANGIOGRAPHY N/A 03/12/2019   Procedure: LEFT HEART CATH AND CORONARY ANGIOGRAPHY;  Surgeon: Belva Crome, MD;  Location: [place] [facility];  Service: Cardiovascular;  Laterality: N/A;   LITHOTRIPSY Right    NEPHROLITHOTOMY Left 09/26/2019   Procedure: LEFT NEPHROLITHOTOMY PERCUTANEOUS;  Surgeon: Irine Seal, MD;  Location: [facility];  Service: Urology;  Laterality: Left;   TOTAL KNEE ARTHROPLASTY Left 02/02/2022   Procedure: TOTAL KNEE ARTHROPLASTY;  Surgeon: Vickey Huger, MD;  Location: [facility];  Service: Orthopedics;  Laterality: Left;   Patient  Active Problem List   Diagnosis Date Noted   S/P total knee replacement 02/02/2022   Encounter for general adult medical examination with abnormal findings 09/17/2021   Other hyperparathyroidism ([place]) 06/13/2021   Vitamin D deficiency 06/13/2021   Nonintractable episodic headache 04/23/2021   Primary osteoarthritis of both knees 04/23/2021   Anemia due to chronic blood loss 03/05/2021   Gastric ulcer 03/05/2021   Type 2 diabetes with nephropathy ([place])    LUQ pain    Prediabetes 02/22/2021   Left renal stone 09/26/2019   CAD in native artery    Ischemic cardiomyopathy 08/15/2019   Congestive heart  failure ([place]) 03/21/2019   ST elevation myocardial infarction involving left anterior descending (LAD) coronary artery (HCC)    Chronic kidney disease, stage IV (severe) ([place]) 06/11/2016   Elevated PSA 06/11/2016   Hyperlipidemia 06/11/2016   Essential hypertension 05/28/2016     PCP: Ihor Dow MD  REFERRING PROVIDER: Vickey Huger, MD Next apt 03/19/22  REFERRING DIAG: PT eval/tx for s/p lt TKA per Vickey Huger, MD  THERAPY DIAG:  Primary osteoarthritis of left knee  Difficulty in walking, not elsewhere classified  Rationale for Evaluation and Treatment Rehabilitation  ONSET DATE: 02/05/2022  SUBJECTIVE:   SUBJECTIVE STATEMENT: Patient states his leg still gives him a fit. Patient states 60% improvement since beginning PT. Still has some tightness in thigh. Patient continues to have trouble with straightening out knee, squatting, stairs, curbs, walking.   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 Yes: NPRS scale: 6/10 Pain location: left knee Pain description: sore, aching, tightness Aggravating factors: movement Relieving factors: rest, medicine  PRECAUTIONS: None  WEIGHT BEARING RESTRICTIONS: No  FALLS:  Has patient fallen in last 6 months? No  LIVING ENVIRONMENT: Lives with: lives with their family Lives in: House/apartment Stairs: Yes: External: 2 steps; none Has following equipment at home: Environmental consultant - 2 wheeled, Wheelchair (manual), and bed side commode  OCCUPATION:   PLOF: Independent  PATIENT GOALS: walk   OBJECTIVE:   DIAGNOSTIC FINDINGS: none  PATIENT SURVEYS:  FOTO 44  03/17/22 41% function 04/13/22: 40% limited  COGNITION: Overall cognitive status: Within functional limits for tasks assessed     SENSATION: WFL  EDEMA:  Normal for this time s/p     PALPATION: Tenderness normal for this time s/p  LOWER EXTREMITY ROM:  Active ROM Right eval Left eval Left 02/23/22  Left 02/26/22 Left 03/05/02 Left 03/10/22 Left  03/12/22 Left 03/17/22 Left 03/30/22 Left 04/13/22  Hip flexion            Hip extension            Hip abduction            Hip adduction            Hip internal rotation            Hip external rotation            Knee flexion  86 96 98 103 105 107 107 110 109  Knee extension  -12 -_0 Lacking 7 Lacking 5 Lacking 7 -10 (lacking) Lacking 8  Ankle dorsiflexion            Ankle plantarflexion            Ankle inversion            Ankle eversion             (  Blank rows = not tested)  LOWER EXTREMITY MMT:  MMT Right eval Left eval Right 03/17/22 Left 03/17/22 Left 03/30/22 Left 04/13/22  Hip flexion   5 5    Hip extension        Hip abduction        Hip adduction        Hip internal rotation        Hip external rotation        Knee flexion  3- (sitting) 5 4+ 4+ 5  Knee extension  Poor to fair quad set 5 4+ 5 5  Ankle dorsiflexion   _0 Ankle plantarflexion        Ankle inversion        Ankle eversion         (Blank rows = not tested)   FUNCTIONAL TESTS:  5 times sit to stand: 02/26/22: 5STS 14.98" initially required HHA for 3 reps then able to complete without HHA for last 2, decreased eccentric contro  GAIT: Distance walked: 10 ft Assistive device utilized: Environmental consultant - 2 wheeled Level of assistance: CGA Comments: decreased heel strike and toe off; extension lag left knee; antalgic and step to gait pattern Reassessment 03/17/22: 5 times sit to stand: 10.98 seconds with use of hands on first rep, relies on momentum FOTO: 41% function  TODAY'S TREATMENT                                                                                                                             04/13/22 Reassessment SLR 3 x 10 LLE Step up 6 inch 2 x 10 LLE Lateral step up 6 inch 2 x 10 LLE Calf stretch on slant board 4 x 20 second holds Ambulation with SPC from 211 to lobby at EOS  04/03/22 Supine: Quad sets 5" hold x  10 SAQ's 2 x 10 Manual knee flexion and extension with PT overpressure x 10  RB seat 10 x 5' full revolutions  Backwards walking on TM x 3 sets for 1"- cues for TKE, BUE support, and L knee flexion. (Noted L knee circumduction to achieve appropriate arc of motion)  TKE's BTB x 20 Heel/toe raises x 20 Slant board 5 x 20"   03/30/22 Progress note AROM Left knee -10 to 110   Supine: Quad sets 5" x 10  Seated: Heel/toe raises x 20 Marching x 20 LAQ's 2 x 10 Manual knee flexion stretch x 8 Hamstring stretch with strap 10 x 10"  Prone hang x 2'  RB seat 10 x 5'  Ambulation down to gym and back to front of hospital without AD (encouraged patient to bring cane)    03/24/22 Supine: Quad sets Left knee 5" x 10 STM to left knee to decrease pain and increase tissue mobility, Manual knee extension x 5  Sitting: manual knee flexion x 5  RB Precor seat 10 x 5' 1/2 revolutions  Standing: Heel/toe raises 2  x 10 Slant board 5 x 20" Knee drives on 8" box x 2 minutes Hamstring stretch with leg on 8" box 5 x 20" squat 2 x 10       03/17/22 Reassessment Step up 4 inch 1 x 10, 6 inch 2 x 15 Lateral step up 6 inch 1x 15   Squat 1 x 10   11//9/23 Supine: manual retrograde massage for edema control with LE on green ball for elevation Quad set 10 x 10 second holds Supine heel slides with belt 10 x 10 second holds Supine hamstring sets with green ball 10 x 10 second holds Step up 4 inch 2x 10 with 1 HHA Lateral step up 4 inch with 2 HHA 1 x 10   03/10/22 Supine heel slides with belt 10 x 10 second holds Quad set 10 x 10 second holds Calf stretch on slant board 4x 20 second holds HR x 20 TR x 20  TKE GTB 10 x 10 second holds Step up 4 inch step 1 x 10 with 1 HHA Prone quad stretch with strap 10 x 5 second holds Supine: manual retrograde massage for edema control    PATIENT EDUCATION:  Education details:04/13/22: reassessment, HEP, POC; 03/17/22: reassessment,  HEP; Eval: Patient educated on exam findings, POC, scope of PT, HEP. Person educated: Patient Education method: Explanation, Demonstration, and Handouts Education comprehension: verbalized understanding, returned demonstration, verbal cues required, and tactile cues required   HOME EXERCISE PROGRAM: Access Code: [email or web address] URL: [email or web address] Date: 02/20/2022 Prepared by: [facility] 03/05/22: prone knee hang, heel and toe raises Exercises - Supine Ankle Pumps  - 3 x daily - 7 x weekly - 1 sets - 10 reps - Supine Quad Set  - 3 x daily - 7 x weekly - 1 sets - 10 reps - Supine Straight Leg Hip Adduction and Quad Set with Ball  - 3 x daily - 7 x weekly - 1 sets - 10 reps - Supine Heel Slides  - 3 x daily - 7 x weekly - 1 sets - 10 reps - Supine Active Straight Leg Raise  - 3 x daily - 7 x weekly - 1 sets - 10 reps - Supine Hip Abduction  - 3 x daily - 7 x weekly - 1 sets - 10 reps - Seated Knee Flexion Extension AROM   - 3 x daily - 7 x weekly - 1 sets - 10 reps - Seated Knee Flexion AAROM  - 3 x daily - 7 x weekly - 1 sets - 10 reps - Seated Long Arc Quad  - 3 x daily - 7 x weekly - 1 sets - 10 reps - Supine Knee Extension Stretch on Towel Roll  - 1 x daily - 7 x weekly - 1 sets - 1-5 min hold   ASSESSMENT:  CLINICAL IMPRESSION: Patient has met 2/2 short term goals and 1/5 long term goals with ability to complete HEP and improvement in symptoms, strength, ROM, activity tolerance, gait, balance, and functional mobility. Remaining goals not met due to continued deficits in pain, ROM, strength, activity tolerance, and function mobility. Patient has made good progress toward remaining goals. Extending POC 2x /week for 4 weeks to improve ROM and strength. Continued with functional strengthening and improving end range mobility which is tolerated well with intermittent rest breaks. Patient stating extreme fatigue today but is able to ambulate to lobby at [facility] with SPC. Patient  will continue to benefit from skilled physical therapy in order to  improve function and reduce impairment.     OBJECTIVE IMPAIRMENTS: Abnormal gait, decreased activity tolerance, decreased balance, decreased endurance, decreased mobility, difficulty walking, decreased ROM, hypomobility, increased edema, increased fascial restrictions, impaired perceived functional ability, increased muscle spasms, impaired flexibility, and pain.   ACTIVITY LIMITATIONS: carrying, lifting, bending, sitting, standing, squatting, sleeping, stairs, transfers, bed mobility, locomotion level, and caring for others  PARTICIPATION LIMITATIONS: meal prep, cleaning, laundry, driving, shopping, and community activity   REHAB POTENTIAL: Good  CLINICAL DECISION MAKING: Stable/uncomplicated  EVALUATION COMPLEXITY: Low   GOALS: Goals reviewed with patient? No  SHORT TERM GOALS: Target date: 03/06/2022  Patient will be independent with initial HEP  Baseline: Goal status: MET  2.  Patient will increase left knee AROM in supine to -8 to 100 to improve ability to navigate steps to enter home. Baseline: see above Goal status: MET    LONG TERM GOALS: Target date: 03/20/2022   Patient will be independent in self management strategies to improve quality of life and functional outcomes.  Baseline:  Goal status: IN PROGRESS  2.  Patient will increase left leg MMTs to 5/5 without pain to promote return to ambulation community distances with minimal deviation.  Baseline: see above Goal status: IN PROGRESS  3.  Patient will improve FOTO score to predicted value  Baseline: 44 03/17/22: 41% function 04/13/22: 40% function Goal status: IN PROGRESS  4.  Patient will increase left knee AROM in supine to -2 to 120 to perform all household tasks and care for his mother without issue. Baseline: 03/30/22 -10 to 110 04/13/22: lacking 8 to 109 Goal status: IN PROGRESS  5.  Patient will perform 5 x sit to stand in 20 sec  or less to demonstrate good functional mobility and increased lower extremity strength.  Baseline: 02/26/22: 5STS 14.98" initially required HHA for 3 reps then able to complete without HHA for last 2, decreased eccentric control 03/17/22 10.98 seconds with use of hands on first rep, relies on momentum Goal status: MET   PLAN:  PT FREQUENCY: 2x/week  PT DURATION: 4 weeks  PLANNED INTERVENTIONS: Therapeutic exercises, Therapeutic activity, Neuromuscular re-education, Balance training, Gait training, Patient/Family education, Joint manipulation, Joint mobilization, Stair training, Orthotic/Fit training, DME instructions, Aquatic Therapy, Dry Needling, Electrical stimulation, Spinal manipulation, Spinal mobilization, Cryotherapy, Moist heat, Compression bandaging, scar mobilization, Splintting, Taping, Traction, Ultrasound, Ionotophoresis 16m/ml Dexamethasone, and Manual therapy  PLAN FOR NEXT SESSION: Focus on quad strengthening and knee mobility. Gait training with LRAD as appropriate. continue slant board, prone quad stretch and contract relax for flexion, continue quad strengthening to improve extension.    10:22 AM, 04/13/22 AMearl LatinPT, DPT Physical Therapist at [facility]

## 2022-04-15 ENCOUNTER — Encounter (HOSPITAL_COMMUNITY): Payer: Medicare Other

## 2022-04-15 ENCOUNTER — Ambulatory Visit (HOSPITAL_COMMUNITY): Payer: Medicare Other

## 2022-04-15 ENCOUNTER — Encounter (HOSPITAL_COMMUNITY): Payer: Self-pay

## 2022-04-15 DIAGNOSIS — R262 Difficulty in walking, not elsewhere classified: Secondary | ICD-10-CM

## 2022-04-15 DIAGNOSIS — M1712 Unilateral primary osteoarthritis, left knee: Secondary | ICD-10-CM | POA: Diagnosis not present

## 2022-04-15 NOTE — Therapy (Signed)
OUTPATIENT PHYSICAL THERAPY LOWER EXTREMITY Treatment     Patient Name: [name] MRN: [id number] DOB:1955/09/03, 66 y.o., male Today's Date: 04/15/2022    PT End of Session - 04/15/22 1256     Visit Number 13    Number of Visits 24    Date for PT Re-Evaluation 05/11/22    Authorization Type UHC Medicare; no copay, 20% co insurance; no dec, no VL, no auth required    Progress Note Due on Visit 22    PT Start Time 0945    PT Stop Time 1028    PT Time Calculation (min) 43 min    Activity Tolerance Patient tolerated treatment well;No increased pain;Patient limited by fatigue    Behavior During Therapy [facility] for tasks assessed/performed                  Past Medical History:  Diagnosis Date   Acute ST elevation myocardial infarction (STEMI) due to occlusion of mid portion of left anterior descending (LAD) coronary artery ([place]) 97/98/9211   Acute systolic heart failure (HCC)    AICD (automatic cardioverter/defibrillator) present    Arthritis    CHF (congestive heart failure) (HCC)    CKD (chronic kidney disease) stage 4, GFR 15-29 ml/min (HCC)    Diabetes mellitus, type II ([place])    Dyspnea    with exertionm periodically   Heart attack ([place]) 03/15/2019   anterior MI    History of kidney stones    Hyperparathyroidism ([place])    Hypertension 2002   Ischemic cardiomyopathy    Left knee pain    Syncope 04/04/2019   after blood draw   Vitamin D deficiency    Past Surgical History:  Procedure Laterality Date   AMPUTATION TOE Left 1991   2 digit of left foot   BIOPSY  02/26/2021   Procedure: BIOPSY;  Surgeon: Rogene Houston, MD;  [facility];  Service: Endoscopy;;   CHOLECYSTECTOMY     COLONOSCOPY     COLONOSCOPY WITH PROPOFOL N/A 02/26/2021   Procedure: COLONOSCOPY WITH PROPOFOL;  Surgeon: Rogene Houston, MD;  [facility];  Service: Endoscopy;  Laterality: N/A;   CORONARY/GRAFT ACUTE MI REVASCULARIZATION N/A 03/12/2019   Procedure:  Coronary/Graft Acute MI Revascularization;  Surgeon: Belva Crome, MD;  Location: [place] [facility];  Service: Cardiovascular;  Laterality: N/A;   ESOPHAGOGASTRODUODENOSCOPY (EGD) WITH PROPOFOL N/A 02/26/2021   Procedure: ESOPHAGOGASTRODUODENOSCOPY (EGD) WITH PROPOFOL;  Surgeon: Rogene Houston, MD;  [facility];  Service: Endoscopy;  Laterality: N/A;   ICD IMPLANT N/A 08/24/2019   Procedure: ICD IMPLANT;  Surgeon: Evans Lance, MD;  Location: [place] [facility] LAB;  Service: Cardiovascular;  Laterality: N/A;   IR URETERAL STENT LEFT NEW ACCESS W/O SEP NEPHROSTOMY CATH  09/26/2019   LEFT HEART CATH AND CORONARY ANGIOGRAPHY N/A 03/12/2019   Procedure: LEFT HEART CATH AND CORONARY ANGIOGRAPHY;  Surgeon: Belva Crome, MD;  Location: [place] [facility];  Service: Cardiovascular;  Laterality: N/A;   LITHOTRIPSY Right    NEPHROLITHOTOMY Left 09/26/2019   Procedure: LEFT NEPHROLITHOTOMY PERCUTANEOUS;  Surgeon: Irine Seal, MD;  Location: [facility];  Service: Urology;  Laterality: Left;   TOTAL KNEE ARTHROPLASTY Left 02/02/2022   Procedure: TOTAL KNEE ARTHROPLASTY;  Surgeon: Vickey Huger, MD;  Location: [facility];  Service: Orthopedics;  Laterality: Left;   Patient Active Problem List   Diagnosis Date Noted   S/P total knee replacement 02/02/2022   Encounter for general adult  medical examination with abnormal findings 09/17/2021   Other hyperparathyroidism ([place]) 06/13/2021   Vitamin D deficiency 06/13/2021   Nonintractable episodic headache 04/23/2021   Primary osteoarthritis of both knees 04/23/2021   Anemia due to chronic blood loss 03/05/2021   Gastric ulcer 03/05/2021   Type 2 diabetes with nephropathy ([place])    LUQ pain    Prediabetes 02/22/2021   Left renal stone 09/26/2019   CAD in native artery    Ischemic cardiomyopathy 08/15/2019   Congestive heart failure ([place]) 03/21/2019   ST elevation myocardial infarction involving left anterior descending (LAD) coronary artery (HCC)     Chronic kidney disease, stage IV (severe) ([place]) 06/11/2016   Elevated PSA 06/11/2016   Hyperlipidemia 06/11/2016   Essential hypertension 05/28/2016     PCP: Ihor Dow MD  REFERRING PROVIDER: Vickey Huger, MD Next apt 03/19/22  REFERRING DIAG: PT eval/tx for s/p lt TKA per Vickey Huger, MD  THERAPY DIAG:  Primary osteoarthritis of left knee  Difficulty in walking, not elsewhere classified  Rationale for Evaluation and Treatment Rehabilitation  ONSET DATE: 02/05/2022  SUBJECTIVE:   SUBJECTIVE STATEMENT: Pt reports pounding in Lt thigh/knee, pain scale 5/10.  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 Yes: NPRS scale: 5/10 Pain location: left knee Pain description: sore, aching, tightness Aggravating factors: movement Relieving factors: rest, medicine  PRECAUTIONS: None  WEIGHT BEARING RESTRICTIONS: No  FALLS:  Has patient fallen in last 6 months? No  LIVING ENVIRONMENT: Lives with: lives with their family Lives in: House/apartment Stairs: Yes: External: 2 steps; none Has following equipment at home: Walker - 2 wheeled, Wheelchair (manual), and bed side commode  OCCUPATION:   PLOF: Independent  PATIENT GOALS: walk   OBJECTIVE:   DIAGNOSTIC FINDINGS: none  PATIENT SURVEYS:  FOTO 44  03/17/22 41% function 04/13/22: 40% limited  COGNITION: Overall cognitive status: Within functional limits for tasks assessed     SENSATION: WFL  EDEMA:  Normal for this time s/p     PALPATION: Tenderness normal for this time s/p  LOWER EXTREMITY ROM:  Active ROM Right eval Left eval Left 02/23/22 Left 02/26/22 Left 03/05/02 Left 03/10/22 Left  03/12/22 Left 03/17/22 Left 03/30/22 Left 04/13/22 Left  04/15/22  Hip flexion             Hip extension             Hip abduction             Hip adduction             Hip internal rotation             Hip external rotation             Knee  flexion  _0  Knee extension  -12 -_1 Lacking 7 Lacking 5 Lacking 7 -10 (lacking) Lacking 8 Lacking 6  Ankle dorsiflexion             Ankle plantarflexion             Ankle inversion             Ankle eversion              (Blank rows = not tested)  LOWER EXTREMITY MMT:  MMT Right eval Left eval Right 03/17/22 Left 03/17/22 Left 03/30/22 Left 04/13/22  Hip flexion   5 5  Hip extension        Hip abduction        Hip adduction        Hip internal rotation        Hip external rotation        Knee flexion  3- (sitting) 5 4+ 4+ 5  Knee extension  Poor to fair quad set 5 4+ 5 5  Ankle dorsiflexion   _0 Ankle plantarflexion        Ankle inversion        Ankle eversion         (Blank rows = not tested)   FUNCTIONAL TESTS:  5 times sit to stand: 02/26/22: 5STS 14.98" initially required HHA for 3 reps then able to complete without HHA for last 2, decreased eccentric contro  GAIT: Distance walked: 10 ft Assistive device utilized: Environmental consultant - 2 wheeled Level of assistance: CGA Comments: decreased heel strike and toe off; extension lag left knee; antalgic and step to gait pattern Reassessment 03/17/22: 5 times sit to stand: 10.98 seconds with use of hands on first rep, relies on momentum FOTO: 41% function  TODAY'S TREATMENT                                                                                                                             04/15/22: Heel raises Knee drive for flexion on 12in step  10 STS  Squat front of chair for mechanics TKE 10x 5" GTB- HEP Prone: 4 min STM to hamstrings Prone knee hang x 2 min Contract relax for flexion 5x 10" holds Prone TKE 10x 5" holds Supine: Manual:Patella mobs/ STM to quad x 4 min Hamstring stretch with rope 3x 30" AROM 6-109  04/13/22 Reassessment SLR 3 x 10 LLE Step up 6 inch 2 x 10 LLE Lateral step up 6 inch 2 x 10 LLE Calf stretch on slant board 4 x 20 second  holds Ambulation with SPC from 211 to lobby at EOS  04/03/22 Supine: Quad sets 5" hold x 10 SAQ's 2 x 10 Manual knee flexion and extension with PT overpressure x 10  RB seat 10 x 5' full revolutions  Backwards walking on TM x 3 sets for 1"- cues for TKE, BUE support, and L knee flexion. (Noted L knee circumduction to achieve appropriate arc of motion)  TKE's BTB x 20 Heel/toe raises x 20 Slant board 5 x 20"   03/30/22 Progress note AROM Left knee -10 to 110   Supine: Quad sets 5" x 10  Seated: Heel/toe raises x 20 Marching x 20 LAQ's 2 x 10 Manual knee flexion stretch x 8 Hamstring stretch with strap 10 x 10"  Prone hang x 2'  RB seat 10 x 5'  Ambulation down to gym and back to front of hospital without AD (encouraged patient to bring cane)    03/24/22 Supine: Quad sets Left knee 5" x 10 STM to left  knee to decrease pain and increase tissue mobility, Manual knee extension x 5  Sitting: manual knee flexion x 5  RB Precor seat 10 x 5' 1/2 revolutions  Standing: Heel/toe raises 2 x 10 Slant board 5 x 20" Knee drives on 8" box x 2 minutes Hamstring stretch with leg on 8" box 5 x 20" squat 2 x 10       03/17/22 Reassessment Step up 4 inch 1 x 10, 6 inch 2 x 15 Lateral step up 6 inch 1x 15   Squat 1 x 10   11//9/23 Supine: manual retrograde massage for edema control with LE on green ball for elevation Quad set 10 x 10 second holds Supine heel slides with belt 10 x 10 second holds Supine hamstring sets with green ball 10 x 10 second holds Step up 4 inch 2x 10 with 1 HHA Lateral step up 4 inch with 2 HHA 1 x 10   03/10/22 Supine heel slides with belt 10 x 10 second holds Quad set 10 x 10 second holds Calf stretch on slant board 4x 20 second holds HR x 20 TR x 20  TKE GTB 10 x 10 second holds Step up 4 inch step 1 x 10 with 1 HHA Prone quad stretch with strap 10 x 5 second holds Supine: manual retrograde massage for edema control    PATIENT  EDUCATION:  Education details:04/13/22: reassessment, HEP, POC; 03/17/22: reassessment, HEP; Eval: Patient educated on exam findings, POC, scope of PT, HEP. Person educated: Patient Education method: Explanation, Demonstration, and Handouts Education comprehension: verbalized understanding, returned demonstration, verbal cues required, and tactile cues required   HOME EXERCISE PROGRAM: Access Code: [email or web address] URL: [email or web address] Date: 02/20/2022 Prepared by: [facility]hab 03/05/22: prone knee hang, heel and toe raises Exercises - Supine Ankle Pumps  - 3 x daily - 7 x weekly - 1 sets - 10 reps - Supine Quad Set  - 3 x daily - 7 x weekly - 1 sets - 10 reps - Supine Straight Leg Hip Adduction and Quad Set with Ball  - 3 x daily - 7 x weekly - 1 sets - 10 reps - Supine Heel Slides  - 3 x daily - 7 x weekly - 1 sets - 10 reps - Supine Active Straight Leg Raise  - 3 x daily - 7 x weekly - 1 sets - 10 reps - Supine Hip Abduction  - 3 x daily - 7 x weekly - 1 sets - 10 reps - Seated Knee Flexion Extension AROM   - 3 x daily - 7 x weekly - 1 sets - 10 reps - Seated Knee Flexion AAROM  - 3 x daily - 7 x weekly - 1 sets - 10 reps - Seated Long Arc Quad  - 3 x daily - 7 x weekly - 1 sets - 10 reps - Supine Knee Extension Stretch on Towel Roll  - 1 x daily - 7 x weekly - 1 sets - 1-5 min hold  04/15/22: TKE, quad and hamstring stretches  ASSESSMENT:  CLINICAL IMPRESSION: Session focus with knee mobility and functional strengthening.  Reviewed current HEP program with additional TKE based exercises and stretches added.  Noted tightness in hamstring, added manual and stretches with positive results following.  Pt limited by fatigue with activity this session, unable to complete 10 STS without rest break required.  Encouraged to complete 10 reps at home prior to rest to address activity tolerance for maximal benefits with verbalized understanding.  EOS reports of pain reduced.   Improved knee extension with AROM 6-109 degrees.     OBJECTIVE IMPAIRMENTS: Abnormal gait, decreased activity tolerance, decreased balance, decreased endurance, decreased mobility, difficulty walking, decreased ROM, hypomobility, increased edema, increased fascial restrictions, impaired perceived functional ability, increased muscle spasms, impaired flexibility, and pain.   ACTIVITY LIMITATIONS: carrying, lifting, bending, sitting, standing, squatting, sleeping, stairs, transfers, bed mobility, locomotion level, and caring for others  PARTICIPATION LIMITATIONS: meal prep, cleaning, laundry, driving, shopping, and community activity   REHAB POTENTIAL: Good  CLINICAL DECISION MAKING: Stable/uncomplicated  EVALUATION COMPLEXITY: Low   GOALS: Goals reviewed with patient? No  SHORT TERM GOALS: Target date: 03/06/2022  Patient will be independent with initial HEP  Baseline: Goal status: MET  2.  Patient will increase left knee AROM in supine to -8 to 100 to improve ability to navigate steps to enter home. Baseline: see above Goal status: MET    LONG TERM GOALS: Target date: 03/20/2022   Patient will be independent in self management strategies to improve quality of life and functional outcomes.  Baseline:  Goal status: IN PROGRESS  2.  Patient will increase left leg MMTs to 5/5 without pain to promote return to ambulation community distances with minimal deviation.  Baseline: see above Goal status: IN PROGRESS  3.  Patient will improve FOTO score to predicted value  Baseline: 44 03/17/22: 41% function 04/13/22: 40% function Goal status: IN PROGRESS  4.  Patient will increase left knee AROM in supine to -2 to 120 to perform all household tasks and care for his mother without issue. Baseline: 03/30/22 -10 to 110 04/13/22: lacking 8 to 109 Goal status: IN PROGRESS  5.  Patient will perform 5 x sit to stand in 20 sec or less to demonstrate good functional mobility and  increased lower extremity strength.  Baseline: 02/26/22: 5STS 14.98" initially required HHA for 3 reps then able to complete without HHA for last 2, decreased eccentric control 03/17/22 10.98 seconds with use of hands on first rep, relies on momentum Goal status: MET   PLAN:  PT FREQUENCY: 2x/week  PT DURATION: 4 weeks  PLANNED INTERVENTIONS: Therapeutic exercises, Therapeutic activity, Neuromuscular re-education, Balance training, Gait training, Patient/Family education, Joint manipulation, Joint mobilization, Stair training, Orthotic/Fit training, DME instructions, Aquatic Therapy, Dry Needling, Electrical stimulation, Spinal manipulation, Spinal mobilization, Cryotherapy, Moist heat, Compression bandaging, scar mobilization, Splintting, Taping, Traction, Ultrasound, Ionotophoresis 24m/ml Dexamethasone, and Manual therapy  PLAN FOR NEXT SESSION: Focus on quad strengthening and knee mobility. Gait training with LRAD as appropriate. continue slant board, prone quad stretch and contract relax for flexion, continue quad strengthening to improve extension.    CIhor Austin LPTA/CLT; CBIS [phone] 1:01 PM, 04/15/22

## 2022-04-21 ENCOUNTER — Ambulatory Visit (HOSPITAL_COMMUNITY): Payer: Medicare Other

## 2022-04-21 ENCOUNTER — Telehealth: Payer: Self-pay | Admitting: Internal Medicine

## 2022-04-21 DIAGNOSIS — M1712 Unilateral primary osteoarthritis, left knee: Secondary | ICD-10-CM | POA: Diagnosis not present

## 2022-04-21 DIAGNOSIS — G894 Chronic pain syndrome: Secondary | ICD-10-CM

## 2022-04-21 DIAGNOSIS — R262 Difficulty in walking, not elsewhere classified: Secondary | ICD-10-CM

## 2022-04-21 DIAGNOSIS — M170 Bilateral primary osteoarthritis of knee: Secondary | ICD-10-CM

## 2022-04-21 MED ORDER — OXYCODONE-ACETAMINOPHEN 10-325 MG PO TABS: 1.0000 | ORAL_TABLET | Freq: Two times a day (BID) | ORAL | 0 refills | Status: DC | PRN

## 2022-04-21 NOTE — Therapy (Signed)
OUTPATIENT PHYSICAL THERAPY LOWER EXTREMITY Treatment     Patient Name: [name] MRN: [id number] DOB:19-May-1955, 66 y.o., male Today's Date: 04/21/2022    PT End of Session - 04/21/22 1111     Visit Number 14    Number of Visits 24    Date for PT Re-Evaluation 05/11/22    Authorization Type UHC Medicare; no copay, 20% co insurance; no dec, no VL, no auth required    Progress Note Due on Visit 22    PT Start Time 1111    PT Stop Time 1155    PT Time Calculation (min) 44 min    Activity Tolerance Patient tolerated treatment well;No increased pain;Patient limited by fatigue    Behavior During Therapy [facility] for tasks assessed/performed                  Past Medical History:  Diagnosis Date   Acute ST elevation myocardial infarction (STEMI) due to occlusion of mid portion of left anterior descending (LAD) coronary artery ([place]) 40/98/1191   Acute systolic heart failure (HCC)    AICD (automatic cardioverter/defibrillator) present    Arthritis    CHF (congestive heart failure) (HCC)    CKD (chronic kidney disease) stage 4, GFR 15-29 ml/min (HCC)    Diabetes mellitus, type II ([place])    Dyspnea    with exertionm periodically   Heart attack ([place]) 03/15/2019   anterior MI    History of kidney stones    Hyperparathyroidism ([place])    Hypertension 2002   Ischemic cardiomyopathy    Left knee pain    Syncope 04/04/2019   after blood draw   Vitamin D deficiency    Past Surgical History:  Procedure Laterality Date   AMPUTATION TOE Left 1991   2 digit of left foot   BIOPSY  02/26/2021   Procedure: BIOPSY;  Surgeon: Rogene Houston, MD;  [facility];  Service: Endoscopy;;   CHOLECYSTECTOMY     COLONOSCOPY     COLONOSCOPY WITH PROPOFOL N/A 02/26/2021   Procedure: COLONOSCOPY WITH PROPOFOL;  Surgeon: Rogene Houston, MD;  [facility];  Service: Endoscopy;  Laterality: N/A;   CORONARY/GRAFT ACUTE MI REVASCULARIZATION N/A 03/12/2019   Procedure:  Coronary/Graft Acute MI Revascularization;  Surgeon: Belva Crome, MD;  Location: [place] [facility];  Service: Cardiovascular;  Laterality: N/A;   ESOPHAGOGASTRODUODENOSCOPY (EGD) WITH PROPOFOL N/A 02/26/2021   Procedure: ESOPHAGOGASTRODUODENOSCOPY (EGD) WITH PROPOFOL;  Surgeon: Rogene Houston, MD;  [facility];  Service: Endoscopy;  Laterality: N/A;   ICD IMPLANT N/A 08/24/2019   Procedure: ICD IMPLANT;  Surgeon: Evans Lance, MD;  Location: [place] [facility];  Service: Cardiovascular;  Laterality: N/A;   IR URETERAL STENT LEFT NEW ACCESS W/O SEP NEPHROSTOMY CATH  09/26/2019   LEFT HEART CATH AND CORONARY ANGIOGRAPHY N/A 03/12/2019   Procedure: LEFT HEART CATH AND CORONARY ANGIOGRAPHY;  Surgeon: Belva Crome, MD;  Location: [place] [facility];  Service: Cardiovascular;  Laterality: N/A;   LITHOTRIPSY Right    NEPHROLITHOTOMY Left 09/26/2019   Procedure: LEFT NEPHROLITHOTOMY PERCUTANEOUS;  Surgeon: Irine Seal, MD;  Location: [facility];  Service: Urology;  Laterality: Left;   TOTAL KNEE ARTHROPLASTY Left 02/02/2022   Procedure: TOTAL KNEE ARTHROPLASTY;  Surgeon: Vickey Huger, MD;  Location: [facility];  Service: Orthopedics;  Laterality: Left;   Patient Active Problem List   Diagnosis Date Noted   S/P total knee replacement 02/02/2022   Encounter for general adult  medical examination with abnormal findings 09/17/2021   Other hyperparathyroidism ([place]) 06/13/2021   Vitamin D deficiency 06/13/2021   Nonintractable episodic headache 04/23/2021   Primary osteoarthritis of both knees 04/23/2021   Anemia due to chronic blood loss 03/05/2021   Gastric ulcer 03/05/2021   Type 2 diabetes with nephropathy ([place])    LUQ pain    Prediabetes 02/22/2021   Left renal stone 09/26/2019   CAD in native artery    Ischemic cardiomyopathy 08/15/2019   Congestive heart failure ([place]) 03/21/2019   ST elevation myocardial infarction involving left anterior descending (LAD) coronary artery (HCC)     Chronic kidney disease, stage IV (severe) ([place]) 06/11/2016   Elevated PSA 06/11/2016   Hyperlipidemia 06/11/2016   Essential hypertension 05/28/2016     PCP: Ihor Dow MD  REFERRING PROVIDER: Vickey Huger, MD Next apt 03/19/22  REFERRING DIAG: PT eval/tx for s/p lt TKA per Vickey Huger, MD  THERAPY DIAG:  Primary osteoarthritis of left knee  Difficulty in walking, not elsewhere classified  Rationale for Evaluation and Treatment Rehabilitation  ONSET DATE: 02/05/2022  SUBJECTIVE:   SUBJECTIVE STATEMENT: Knee is "tolerable"; "doesn't like the cold"  PERTINENT HISTORY: 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 Yes: NPRS scale: 5/10 Pain location: left knee Pain description: sore, aching, tightness Aggravating factors: movement Relieving factors: rest, medicine  PRECAUTIONS: None  WEIGHT BEARING RESTRICTIONS: No  FALLS:  Has patient fallen in last 6 months? No  LIVING ENVIRONMENT: Lives with: lives with their family Lives in: House/apartment Stairs: Yes: External: 2 steps; none Has following equipment at home: Walker - 2 wheeled, Wheelchair (manual), and bed side commode  OCCUPATION:   PLOF: Independent  PATIENT GOALS: walk   OBJECTIVE:   DIAGNOSTIC FINDINGS: none  PATIENT SURVEYS:  FOTO 44  03/17/22 41% function 04/13/22: 40% limited  COGNITION: Overall cognitive status: Within functional limits for tasks assessed     SENSATION: WFL  EDEMA:  Normal for this time s/p     PALPATION: Tenderness normal for this time s/p  LOWER EXTREMITY ROM:  Active ROM Right eval Left eval Left 02/23/22 Left 02/26/22 Left 03/05/02 Left 03/10/22 Left  03/12/22 Left 03/17/22 Left 03/30/22 Left 04/13/22 Left  04/15/22  Hip flexion             Hip extension             Hip abduction             Hip adduction             Hip internal rotation             Hip external rotation             Knee flexion  86  96 98 103 105 107 107 110 109 109  Knee extension  -12 -_0 Lacking 7 Lacking 5 Lacking 7 -10 (lacking) Lacking 8 Lacking 6  Ankle dorsiflexion             Ankle plantarflexion             Ankle inversion             Ankle eversion              (Blank rows = not tested)  LOWER EXTREMITY MMT:  MMT Right eval Left eval Right 03/17/22 Left 03/17/22 Left 03/30/22 Left 04/13/22  Hip flexion   5 5    Hip  extension        Hip abduction        Hip adduction        Hip internal rotation        Hip external rotation        Knee flexion  3- (sitting) 5 4+ 4+ 5  Knee extension  Poor to fair quad set 5 4+ 5 5  Ankle dorsiflexion   _0 Ankle plantarflexion        Ankle inversion        Ankle eversion         (Blank rows = not tested)   FUNCTIONAL TESTS:  5 times sit to stand: 02/26/22: 5STS 14.98" initially required HHA for 3 reps then able to complete without HHA for last 2, decreased eccentric contro  GAIT: Distance walked: 10 ft Assistive device utilized: Environmental consultant - 2 wheeled Level of assistance: CGA Comments: decreased heel strike and toe off; extension lag left knee; antalgic and step to gait pattern Reassessment 03/17/22: 5 times sit to stand: 10.98 seconds with use of hands on first rep, relies on momentum FOTO: 41% function  TODAY'S TREATMENT                                                                                                                             04/21/22 Standing: Heel/toe raises x 20 Left knee drives x 2' on 12" step 6" step up 1 HHA 2 x 10 6" lateral step up 2 x 10 Squats to chair for target 2 x 10 Slant board 5 x 20"  Seated: Hamstring stretch with strap 10 x 10" Long sitting hamstring stretch 5 x 20"  Recumbent bike seat 19 x 5'; unable to make full revolution initially  Left knee AROM: -10 to 110 today at end of treatment      04/15/22: Heel raises Knee drive for flexion on 12in step  10 STS  Squat front of chair for  mechanics TKE 10x 5" GTB- HEP Prone: 4 min STM to hamstrings Prone knee hang x 2 min Contract relax for flexion 5x 10" holds Prone TKE 10x 5" holds Supine: Manual:Patella mobs/ STM to quad x 4 min Hamstring stretch with rope 3x 30" AROM 6-109  04/13/22 Reassessment SLR 3 x 10 LLE Step up 6 inch 2 x 10 LLE Lateral step up 6 inch 2 x 10 LLE Calf stretch on slant board 4 x 20 second holds Ambulation with SPC from 211 to lobby at EOS  04/03/22 Supine: Quad sets 5" hold x 10 SAQ's 2 x 10 Manual knee flexion and extension with PT overpressure x 10  RB seat 10 x 5' full revolutions  Backwards walking on TM x 3 sets for 1"- cues for TKE, BUE support, and L knee flexion. (Noted L knee circumduction to achieve appropriate arc of motion)  TKE's BTB x 20 Heel/toe raises x 20 Slant board 5 x 20"  03/30/22 Progress note AROM Left knee -10 to 110   Supine: Quad sets 5" x 10  Seated: Heel/toe raises x 20 Marching x 20 LAQ's 2 x 10 Manual knee flexion stretch x 8 Hamstring stretch with strap 10 x 10"  Prone hang x 2'  RB seat 10 x 5'  Ambulation down to gym and back to front of hospital without AD (encouraged patient to bring cane)    03/24/22 Supine: Quad sets Left knee 5" x 10 STM to left knee to decrease pain and increase tissue mobility, Manual knee extension x 5  Sitting: manual knee flexion x 5  RB Precor seat 10 x 5' 1/2 revolutions  Standing: Heel/toe raises 2 x 10 Slant board 5 x 20" Knee drives on 8" box x 2 minutes Hamstring stretch with leg on 8" box 5 x 20" squat 2 x 10         03/17/22 Reassessment Step up 4 inch 1 x 10, 6 inch 2 x 15 Lateral step up 6 inch 1x 15   Squat 1 x 10   11//9/23 Supine: manual retrograde massage for edema control with LE on green ball for elevation Quad set 10 x 10 second holds Supine heel slides with belt 10 x 10 second holds Supine hamstring sets with green ball 10 x 10 second holds Step up 4 inch 2x  10 with 1 HHA Lateral step up 4 inch with 2 HHA 1 x 10   03/10/22 Supine heel slides with belt 10 x 10 second holds Quad set 10 x 10 second holds Calf stretch on slant board 4x 20 second holds HR x 20 TR x 20  TKE GTB 10 x 10 second holds Step up 4 inch step 1 x 10 with 1 HHA Prone quad stretch with strap 10 x 5 second holds Supine: manual retrograde massage for edema control    PATIENT EDUCATION:  Education details:04/13/22: reassessment, HEP, POC; 03/17/22: reassessment, HEP; Eval: Patient educated on exam findings, POC, scope of PT, HEP. Person educated: Patient Education method: Explanation, Demonstration, and Handouts Education comprehension: verbalized understanding, returned demonstration, verbal cues required, and tactile cues required   HOME EXERCISE PROGRAM: Access Code: [email or web address] URL: [email or web address] Date: 02/20/2022 Prepared by: [facility] 03/05/22: prone knee hang, heel and toe raises Exercises - Supine Ankle Pumps  - 3 x daily - 7 x weekly - 1 sets - 10 reps - Supine Quad Set  - 3 x daily - 7 x weekly - 1 sets - 10 reps - Supine Straight Leg Hip Adduction and Quad Set with Ball  - 3 x daily - 7 x weekly - 1 sets - 10 reps - Supine Heel Slides  - 3 x daily - 7 x weekly - 1 sets - 10 reps - Supine Active Straight Leg Raise  - 3 x daily - 7 x weekly - 1 sets - 10 reps - Supine Hip Abduction  - 3 x daily - 7 x weekly - 1 sets - 10 reps - Seated Knee Flexion Extension AROM   - 3 x daily - 7 x weekly - 1 sets - 10 reps - Seated Knee Flexion AAROM  - 3 x daily - 7 x weekly - 1 sets - 10 reps - Seated Long Arc Quad  - 3 x daily - 7 x weekly - 1 sets - 10 reps - Supine Knee Extension Stretch on Towel Roll  - 1 x daily - 7 x weekly - 1 sets -  1-5 min hold  04/15/22: TKE, quad and hamstring stretches  ASSESSMENT:  CLINICAL IMPRESSION: Today's session focused on left knee mobility and strengthening.  Patient walking in the therapy gym with [facility] today.   Patient continues with complaints of pain but objectively is progressing well.  Able to take a few front step ups without using hands to assist today. Patient still tends to guard knee quite a bit; and is unable initially to make full revolution today but then is able to make backwards and then forwards revolutions. He continues with extension lag and PT encourages compliance with extension stretching at home. Patient will benefit from continued skilled therapy services to address deficits and promote return to optimal function.       OBJECTIVE IMPAIRMENTS: Abnormal gait, decreased activity tolerance, decreased balance, decreased endurance, decreased mobility, difficulty walking, decreased ROM, hypomobility, increased edema, increased fascial restrictions, impaired perceived functional ability, increased muscle spasms, impaired flexibility, and pain.   ACTIVITY LIMITATIONS: carrying, lifting, bending, sitting, standing, squatting, sleeping, stairs, transfers, bed mobility, locomotion level, and caring for others  PARTICIPATION LIMITATIONS: meal prep, cleaning, laundry, driving, shopping, and community activity   REHAB POTENTIAL: Good  CLINICAL DECISION MAKING: Stable/uncomplicated  EVALUATION COMPLEXITY: Low   GOALS: Goals reviewed with patient? No  SHORT TERM GOALS: Target date: 03/06/2022  Patient will be independent with initial HEP  Baseline: Goal status: MET  2.  Patient will increase left knee AROM in supine to -8 to 100 to improve ability to navigate steps to enter home. Baseline: see above Goal status: MET    LONG TERM GOALS: Target date: 03/20/2022   Patient will be independent in self management strategies to improve quality of life and functional outcomes.  Baseline:  Goal status: IN PROGRESS  2.  Patient will increase left leg MMTs to 5/5 without pain to promote return to ambulation community distances with minimal deviation.  Baseline: see above Goal status: IN  PROGRESS  3.  Patient will improve FOTO score to predicted value  Baseline: 44 03/17/22: 41% function 04/13/22: 40% function Goal status: IN PROGRESS  4.  Patient will increase left knee AROM in supine to -2 to 120 to perform all household tasks and care for his mother without issue. Baseline: 03/30/22 -10 to 110 04/13/22: lacking 8 to 109 Goal status: IN PROGRESS  5.  Patient will perform 5 x sit to stand in 20 sec or less to demonstrate good functional mobility and increased lower extremity strength.  Baseline: 02/26/22: 5STS 14.98" initially required HHA for 3 reps then able to complete without HHA for last 2, decreased eccentric control 03/17/22 10.98 seconds with use of hands on first rep, relies on momentum Goal status: MET   PLAN:  PT FREQUENCY: 2x/week  PT DURATION: 4 weeks  PLANNED INTERVENTIONS: Therapeutic exercises, Therapeutic activity, Neuromuscular re-education, Balance training, Gait training, Patient/Family education, Joint manipulation, Joint mobilization, Stair training, Orthotic/Fit training, DME instructions, Aquatic Therapy, Dry Needling, Electrical stimulation, Spinal manipulation, Spinal mobilization, Cryotherapy, Moist heat, Compression bandaging, scar mobilization, Splintting, Taping, Traction, Ultrasound, Ionotophoresis 7m/ml Dexamethasone, and Manual therapy  PLAN FOR NEXT SESSION: Focus on quad strengthening and knee mobility. Gait training with LRAD as appropriate. continue slant board, prone quad stretch and contract relax for flexion, continue quad strengthening to improve extension.    11:55 AM, 04/21/22 Codylee Patil Small Isabel Ardila MPT [facility] [place] #[phone]

## 2022-04-21 NOTE — Telephone Encounter (Signed)
Med refill must have before 04/28/2022  oxyCODONE-acetaminophen (PERCOCET) 10-325 MG tablet [[id number]]   Pharmacy: [facility]  Please contact patient to let patient know when rx is called into his pharmacy.

## 2022-04-22 IMAGING — DX DG ABDOMEN 1V
2 series · 2 of 2 positions shown · non-contrast
Comparison: February 22, 2021

CLINICAL DATA: renal stones

EXAM:
ABDOMEN - 1 VIEW

[abdomen kub (1 of 2)]
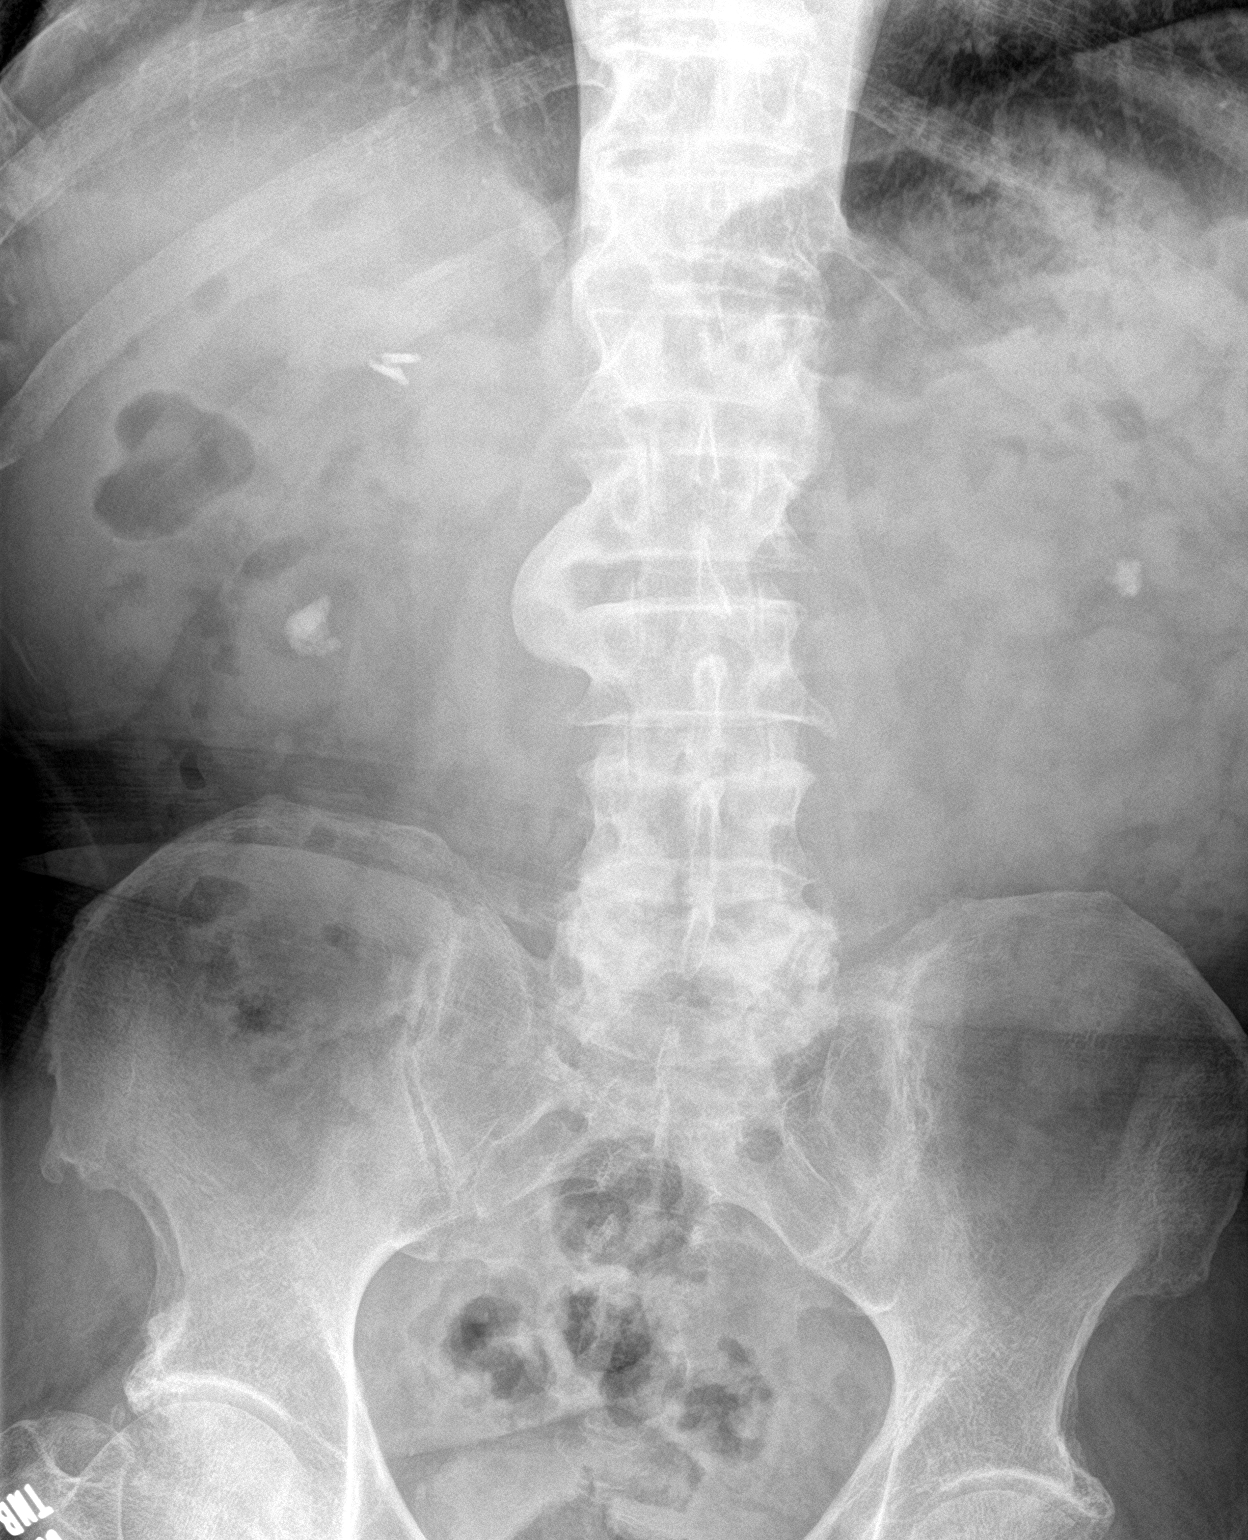

[abdomen kub (2 of 2)]
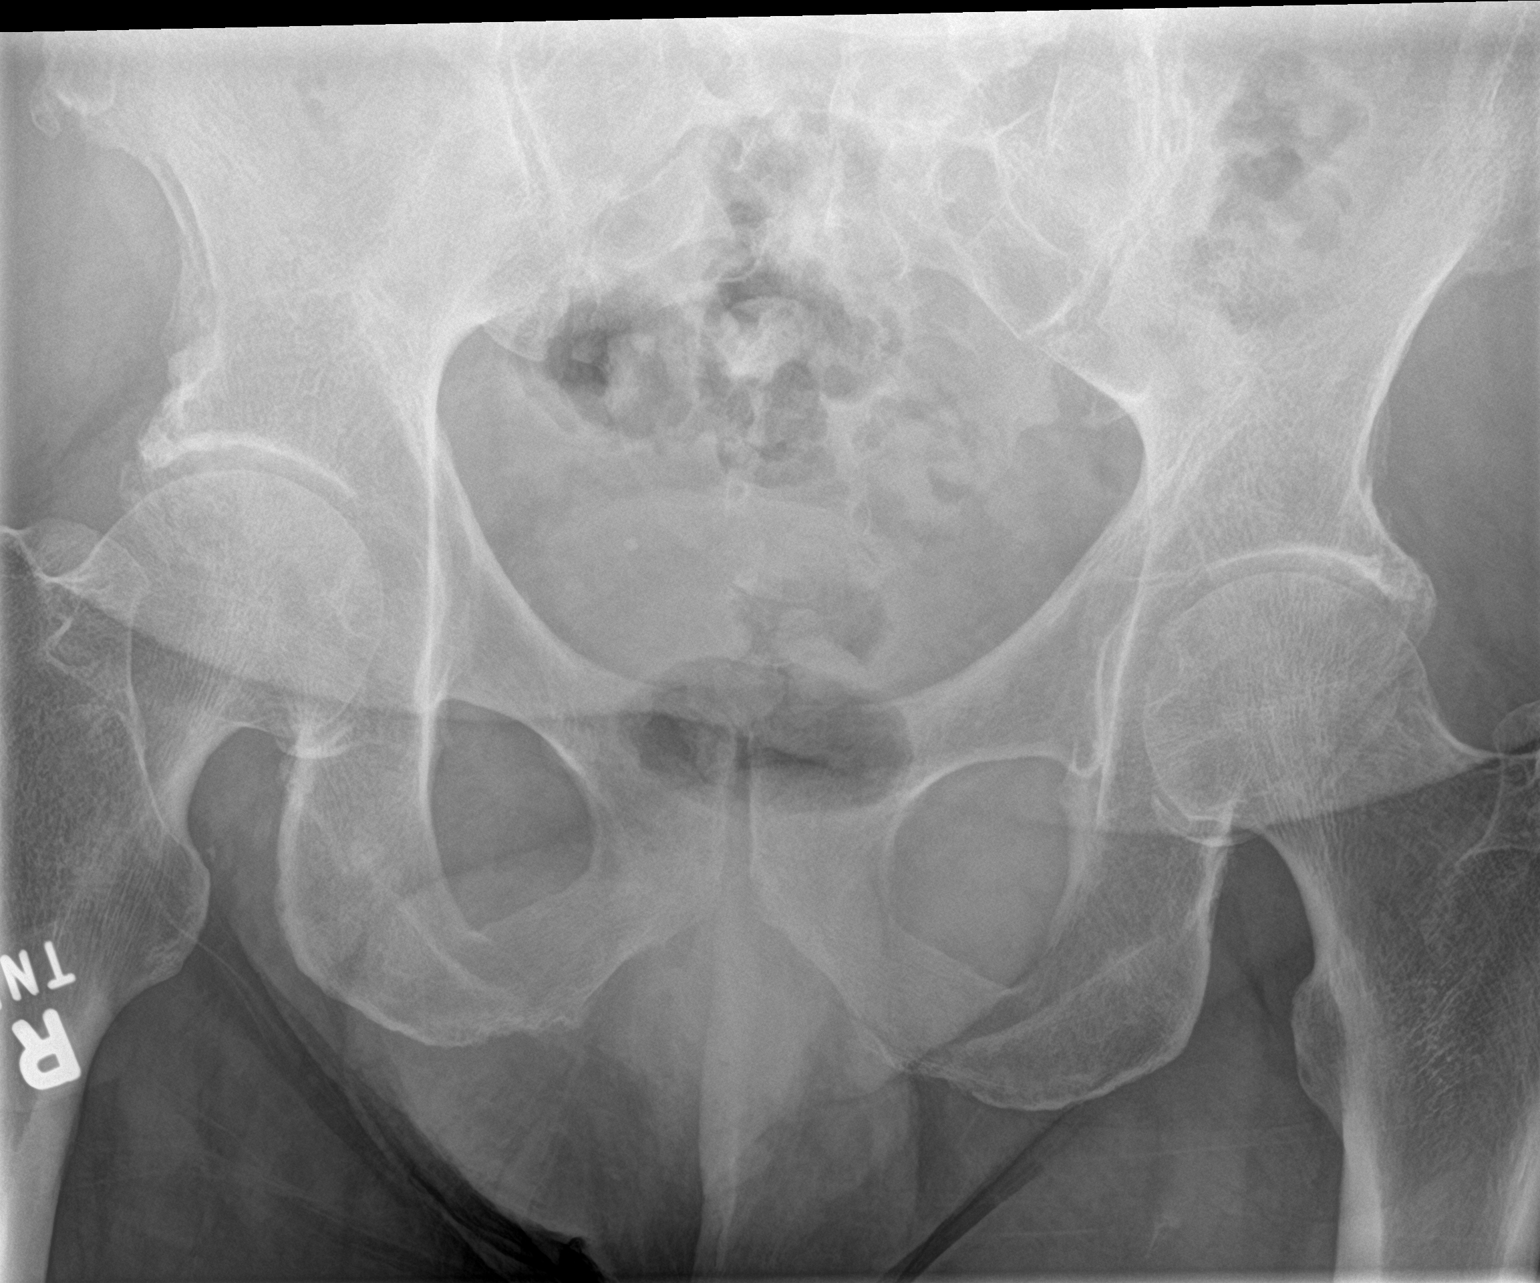

[2 of 2 positions shown; findings below may reference images not displayed]

FINDINGS: Nonobstructive bowel gas pattern. Multiple radiopaque foci project
over the contours of bilateral kidneys. Largest conglomeration of
radiopaque foci over the RIGHT kidney measures 13 mm. Largest over
the LEFT kidney measures 10 mm. This is similar in comparison to
prior CT. Status post cholecystectomy. Pelvic phleboliths.
Degenerative changes of the lumbar spine.
IMPRESSION: Multiple bilateral nephrolithiasis.

## 2022-04-23 ENCOUNTER — Ambulatory Visit (HOSPITAL_COMMUNITY): Payer: Medicare Other

## 2022-04-23 DIAGNOSIS — R262 Difficulty in walking, not elsewhere classified: Secondary | ICD-10-CM

## 2022-04-23 DIAGNOSIS — M1712 Unilateral primary osteoarthritis, left knee: Secondary | ICD-10-CM

## 2022-04-23 NOTE — Therapy (Signed)
OUTPATIENT PHYSICAL THERAPY LOWER EXTREMITY Treatment     Patient Name: [name] MRN: [id number] DOB:December 15, 1955, 66 y.o., male Today's Date: 04/23/2022    PT End of Session - 04/23/22 1118     Visit Number 15    Number of Visits 24    Date for PT Re-Evaluation 05/11/22    Authorization Type UHC Medicare; no copay, 20% co insurance; no dec, no VL, no auth required    Progress Note Due on Visit 22    PT Start Time 1117    PT Stop Time 1200    PT Time Calculation (min) 43 min    Activity Tolerance Patient tolerated treatment well;No increased pain;Patient limited by fatigue    Behavior During Therapy [facility] for tasks assessed/performed                  Past Medical History:  Diagnosis Date   Acute ST elevation myocardial infarction (STEMI) due to occlusion of mid portion of left anterior descending (LAD) coronary artery ([place]) 01/65/5374   Acute systolic heart failure (HCC)    AICD (automatic cardioverter/defibrillator) present    Arthritis    CHF (congestive heart failure) (HCC)    CKD (chronic kidney disease) stage 4, GFR 15-29 ml/min (HCC)    Diabetes mellitus, type II ([place])    Dyspnea    with exertionm periodically   Heart attack ([place]) 03/15/2019   anterior MI    History of kidney stones    Hyperparathyroidism ([place])    Hypertension 2002   Ischemic cardiomyopathy    Left knee pain    Syncope 04/04/2019   after blood draw   Vitamin D deficiency    Past Surgical History:  Procedure Laterality Date   AMPUTATION TOE Left 1991   2 digit of left foot   BIOPSY  02/26/2021   Procedure: BIOPSY;  Surgeon: Rogene Houston, MD;  [facility];  Service: Endoscopy;;   CHOLECYSTECTOMY     COLONOSCOPY     COLONOSCOPY WITH PROPOFOL N/A 02/26/2021   Procedure: COLONOSCOPY WITH PROPOFOL;  Surgeon: Rogene Houston, MD;  [facility];  Service: Endoscopy;  Laterality: N/A;   CORONARY/GRAFT ACUTE MI REVASCULARIZATION N/A 03/12/2019   Procedure:  Coronary/Graft Acute MI Revascularization;  Surgeon: Belva Crome, MD;  Location: [place] [facility];  Service: Cardiovascular;  Laterality: N/A;   ESOPHAGOGASTRODUODENOSCOPY (EGD) WITH PROPOFOL N/A 02/26/2021   Procedure: ESOPHAGOGASTRODUODENOSCOPY (EGD) WITH PROPOFOL;  Surgeon: Rogene Houston, MD;  [facility];  Service: Endoscopy;  Laterality: N/A;   ICD IMPLANT N/A 08/24/2019   Procedure: ICD IMPLANT;  Surgeon: Evans Lance, MD;  Location: [place] [facility];  Service: Cardiovascular;  Laterality: N/A;   IR URETERAL STENT LEFT NEW ACCESS W/O SEP NEPHROSTOMY CATH  09/26/2019   LEFT HEART CATH AND CORONARY ANGIOGRAPHY N/A 03/12/2019   Procedure: LEFT HEART CATH AND CORONARY ANGIOGRAPHY;  Surgeon: Belva Crome, MD;  Location: [place] [facility];  Service: Cardiovascular;  Laterality: N/A;   LITHOTRIPSY Right    NEPHROLITHOTOMY Left 09/26/2019   Procedure: LEFT NEPHROLITHOTOMY PERCUTANEOUS;  Surgeon: Irine Seal, MD;  Location: [facility];  Service: Urology;  Laterality: Left;   TOTAL KNEE ARTHROPLASTY Left 02/02/2022   Procedure: TOTAL KNEE ARTHROPLASTY;  Surgeon: Vickey Huger, MD;  Location: [facility];  Service: Orthopedics;  Laterality: Left;   Patient Active Problem List   Diagnosis Date Noted   S/P total knee replacement 02/02/2022   Encounter for general adult  medical examination with abnormal findings 09/17/2021   Other hyperparathyroidism ([place]) 06/13/2021   Vitamin D deficiency 06/13/2021   Nonintractable episodic headache 04/23/2021   Primary osteoarthritis of both knees 04/23/2021   Anemia due to chronic blood loss 03/05/2021   Gastric ulcer 03/05/2021   Type 2 diabetes with nephropathy ([place])    LUQ pain    Prediabetes 02/22/2021   Left renal stone 09/26/2019   CAD in native artery    Ischemic cardiomyopathy 08/15/2019   Congestive heart failure ([place]) 03/21/2019   ST elevation myocardial infarction involving left anterior descending (LAD) coronary artery (HCC)     Chronic kidney disease, stage IV (severe) ([place]) 06/11/2016   Elevated PSA 06/11/2016   Hyperlipidemia 06/11/2016   Essential hypertension 05/28/2016     PCP: Ihor Dow MD  REFERRING PROVIDER: Vickey Huger, MD Next apt 03/19/22  REFERRING DIAG: PT eval/tx for s/p lt TKA per Vickey Huger, MD  THERAPY DIAG:  Primary osteoarthritis of left knee  Difficulty in walking, not elsewhere classified  Rationale for Evaluation and Treatment Rehabilitation  ONSET DATE: 02/05/2022  SUBJECTIVE:   SUBJECTIVE STATEMENT: Knee pain " not too bad" this morning; able to do more at home; not using cane around the house PERTINENT HISTORY: 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 Yes: NPRS scale: 4/10 Pain location: left knee Pain description: sore, aching, tightness Aggravating factors: movement Relieving factors: rest, medicine  PRECAUTIONS: None  WEIGHT BEARING RESTRICTIONS: No  FALLS:  Has patient fallen in last 6 months? No  LIVING ENVIRONMENT: Lives with: lives with their family Lives in: House/apartment Stairs: Yes: External: 2 steps; none Has following equipment at home: Walker - 2 wheeled, Wheelchair (manual), and bed side commode  OCCUPATION:   PLOF: Independent  PATIENT GOALS: walk   OBJECTIVE:   DIAGNOSTIC FINDINGS: none  PATIENT SURVEYS:  FOTO 44  03/17/22 41% function 04/13/22: 40% limited  COGNITION: Overall cognitive status: Within functional limits for tasks assessed     SENSATION: WFL  EDEMA:  Normal for this time s/p     PALPATION: Tenderness normal for this time s/p  LOWER EXTREMITY ROM:  Active ROM Right eval Left eval Left 02/23/22 Left 02/26/22 Left 03/05/02 Left 03/10/22 Left  03/12/22 Left 03/17/22 Left 03/30/22 Left 04/13/22 Left  04/15/22  Hip flexion             Hip extension             Hip abduction             Hip adduction             Hip internal rotation             Hip  external rotation             Knee flexion  _0  Knee extension  -12 -_1 Lacking 7 Lacking 5 Lacking 7 -10 (lacking) Lacking 8 Lacking 6  Ankle dorsiflexion             Ankle plantarflexion             Ankle inversion             Ankle eversion              (Blank rows = not tested)  LOWER EXTREMITY MMT:  MMT Right eval Left eval Right 03/17/22 Left 03/17/22 Left 03/30/22 Left  04/13/22  Hip flexion   5 5    Hip extension        Hip abduction        Hip adduction        Hip internal rotation        Hip external rotation        Knee flexion  3- (sitting) 5 4+ 4+ 5  Knee extension  Poor to fair quad set 5 4+ 5 5  Ankle dorsiflexion   _0 Ankle plantarflexion        Ankle inversion        Ankle eversion         (Blank rows = not tested)   FUNCTIONAL TESTS:  5 times sit to stand: 02/26/22: 5STS 14.98" initially required HHA for 3 reps then able to complete without HHA for last 2, decreased eccentric contro  GAIT: Distance walked: 10 ft Assistive device utilized: Environmental consultant - 2 wheeled Level of assistance: CGA Comments: decreased heel strike and toe off; extension lag left knee; antalgic and step to gait pattern Reassessment 03/17/22: 5 times sit to stand: 10.98 seconds with use of hands on first rep, relies on momentum FOTO: 41% function  TODAY'S TREATMENT                                                                                                                             04/23/22 Standing: Heel/toe raises x 20 Left knee drives x 2' on 12" step  8" step ups 2 x 10 8" lateral step ups 2 x 10 Squats to chair for target 2 x 10 Slant board 5 x 20"  RB seat 19 x 5' full revolutions  Seated: Hamstring stretch 5 x 20"  [employer] 3 plates x 10 leg press    04/21/22 Standing: Heel/toe raises x 20 Left knee drives x 2' on 12" step 6" step up 1 HHA 2 x 10 6" lateral step up 2 x 10 Squats to chair for target 2 x  10 Slant board 5 x 20"  Seated: Hamstring stretch with strap 10 x 10" Long sitting hamstring stretch 5 x 20"  Recumbent bike seat 19 x 5'; unable to make full revolution initially  Left knee AROM: -10 to 110 today at end of treatment      04/15/22: Heel raises Knee drive for flexion on 12in step  10 STS  Squat front of chair for mechanics TKE 10x 5" GTB- HEP Prone: 4 min STM to hamstrings Prone knee hang x 2 min Contract relax for flexion 5x 10" holds Prone TKE 10x 5" holds Supine: Manual:Patella mobs/ STM to quad x 4 min Hamstring stretch with rope 3x 30" AROM 6-109  04/13/22 Reassessment SLR 3 x 10 LLE Step up 6 inch 2 x 10 LLE Lateral step up 6 inch 2 x 10 LLE Calf stretch on slant board 4 x 20 second holds Ambulation with SPC from 211 to  lobby at EOS  04/03/22 Supine: Quad sets 5" hold x 10 SAQ's 2 x 10 Manual knee flexion and extension with PT overpressure x 10  RB seat 10 x 5' full revolutions  Backwards walking on TM x 3 sets for 1"- cues for TKE, BUE support, and L knee flexion. (Noted L knee circumduction to achieve appropriate arc of motion)  TKE's BTB x 20 Heel/toe raises x 20 Slant board 5 x 20"   03/30/22 Progress note AROM Left knee -10 to 110   Supine: Quad sets 5" x 10  Seated: Heel/toe raises x 20 Marching x 20 LAQ's 2 x 10 Manual knee flexion stretch x 8 Hamstring stretch with strap 10 x 10"  Prone hang x 2'  RB seat 10 x 5'  Ambulation down to gym and back to front of hospital without AD (encouraged patient to bring cane)    03/24/22 Supine: Quad sets Left knee 5" x 10 STM to left knee to decrease pain and increase tissue mobility, Manual knee extension x 5  Sitting: manual knee flexion x 5  RB Precor seat 10 x 5' 1/2 revolutions  Standing: Heel/toe raises 2 x 10 Slant board 5 x 20" Knee drives on 8" box x 2 minutes Hamstring stretch with leg on 8" box 5 x 20" squat 2 x 10          03/17/22 Reassessment Step up 4 inch 1 x 10, 6 inch 2 x 15 Lateral step up 6 inch 1x 15   Squat 1 x 10   11//9/23 Supine: manual retrograde massage for edema control with LE on green ball for elevation Quad set 10 x 10 second holds Supine heel slides with belt 10 x 10 second holds Supine hamstring sets with green ball 10 x 10 second holds Step up 4 inch 2x 10 with 1 HHA Lateral step up 4 inch with 2 HHA 1 x 10   03/10/22 Supine heel slides with belt 10 x 10 second holds Quad set 10 x 10 second holds Calf stretch on slant board 4x 20 second holds HR x 20 TR x 20  TKE GTB 10 x 10 second holds Step up 4 inch step 1 x 10 with 1 HHA Prone quad stretch with strap 10 x 5 second holds Supine: manual retrograde massage for edema control    PATIENT EDUCATION:  Education details:04/13/22: reassessment, HEP, POC; 03/17/22: reassessment, HEP; Eval: Patient educated on exam findings, POC, scope of PT, HEP. Person educated: Patient Education method: Explanation, Demonstration, and Handouts Education comprehension: verbalized understanding, returned demonstration, verbal cues required, and tactile cues required   HOME EXERCISE PROGRAM: Access Code: [email or web address] URL: [email or web address] Date: 02/20/2022 Prepared by: [facility] 03/05/22: prone knee hang, heel and toe raises Exercises - Supine Ankle Pumps  - 3 x daily - 7 x weekly - 1 sets - 10 reps - Supine Quad Set  - 3 x daily - 7 x weekly - 1 sets - 10 reps - Supine Straight Leg Hip Adduction and Quad Set with Ball  - 3 x daily - 7 x weekly - 1 sets - 10 reps - Supine Heel Slides  - 3 x daily - 7 x weekly - 1 sets - 10 reps - Supine Active Straight Leg Raise  - 3 x daily - 7 x weekly - 1 sets - 10 reps - Supine Hip Abduction  - 3 x daily - 7 x weekly - 1 sets - 10 reps - Seated Knee  Flexion Extension AROM   - 3 x daily - 7 x weekly - 1 sets - 10 reps - Seated Knee Flexion AAROM  - 3 x daily - 7 x weekly - 1  sets - 10 reps - Seated Long Arc Quad  - 3 x daily - 7 x weekly - 1 sets - 10 reps - Supine Knee Extension Stretch on Towel Roll  - 1 x daily - 7 x weekly - 1 sets - 1-5 min hold  04/15/22: TKE, quad and hamstring stretches  ASSESSMENT:  CLINICAL IMPRESSION: Today's session focused on left knee mobility and strengthening. Good challenge with increased step height with step ups today.  Fatigued and out of breath after step ups. Added leg press today without issue; patient continues with slight extension lag but overall progressing well.   Patient will benefit from continued skilled therapy services to address deficits and promote return to optimal function.       OBJECTIVE IMPAIRMENTS: Abnormal gait, decreased activity tolerance, decreased balance, decreased endurance, decreased mobility, difficulty walking, decreased ROM, hypomobility, increased edema, increased fascial restrictions, impaired perceived functional ability, increased muscle spasms, impaired flexibility, and pain.   ACTIVITY LIMITATIONS: carrying, lifting, bending, sitting, standing, squatting, sleeping, stairs, transfers, bed mobility, locomotion level, and caring for others  PARTICIPATION LIMITATIONS: meal prep, cleaning, laundry, driving, shopping, and community activity   REHAB POTENTIAL: Good  CLINICAL DECISION MAKING: Stable/uncomplicated  EVALUATION COMPLEXITY: Low   GOALS: Goals reviewed with patient? No  SHORT TERM GOALS: Target date: 03/06/2022  Patient will be independent with initial HEP  Baseline: Goal status: MET  2.  Patient will increase left knee AROM in supine to -8 to 100 to improve ability to navigate steps to enter home. Baseline: see above Goal status: MET    LONG TERM GOALS: Target date: 03/20/2022   Patient will be independent in self management strategies to improve quality of life and functional outcomes.  Baseline:  Goal status: IN PROGRESS  2.  Patient will increase left leg  MMTs to 5/5 without pain to promote return to ambulation community distances with minimal deviation.  Baseline: see above Goal status: IN PROGRESS  3.  Patient will improve FOTO score to predicted value  Baseline: 44 03/17/22: 41% function 04/13/22: 40% function Goal status: IN PROGRESS  4.  Patient will increase left knee AROM in supine to -2 to 120 to perform all household tasks and care for his mother without issue. Baseline: 03/30/22 -10 to 110 04/13/22: lacking 8 to 109 Goal status: IN PROGRESS  5.  Patient will perform 5 x sit to stand in 20 sec or less to demonstrate good functional mobility and increased lower extremity strength.  Baseline: 02/26/22: 5STS 14.98" initially required HHA for 3 reps then able to complete without HHA for last 2, decreased eccentric control 03/17/22 10.98 seconds with use of hands on first rep, relies on momentum Goal status: MET   PLAN:  PT FREQUENCY: 2x/week  PT DURATION: 4 weeks  PLANNED INTERVENTIONS: Therapeutic exercises, Therapeutic activity, Neuromuscular re-education, Balance training, Gait training, Patient/Family education, Joint manipulation, Joint mobilization, Stair training, Orthotic/Fit training, DME instructions, Aquatic Therapy, Dry Needling, Electrical stimulation, Spinal manipulation, Spinal mobilization, Cryotherapy, Moist heat, Compression bandaging, scar mobilization, Splintting, Taping, Traction, Ultrasound, Ionotophoresis 54m/ml Dexamethasone, and Manual therapy  PLAN FOR NEXT SESSION: Focus on quad strengthening and knee mobility. Gait training with LRAD as appropriate. continue slant board, prone quad stretch and contract relax for flexion, continue quad strengthening to improve extension.  12:01 PM, 04/23/22 Neizan Debruhl Small Dashiell Franchino MPT [place] [facility] [place] [phone]

## 2022-05-05 ENCOUNTER — Other Ambulatory Visit: Payer: Self-pay | Admitting: Internal Medicine

## 2022-05-05 ENCOUNTER — Ambulatory Visit (HOSPITAL_COMMUNITY): Payer: 59 | Attending: Orthopedic Surgery

## 2022-05-05 DIAGNOSIS — R262 Difficulty in walking, not elsewhere classified: Secondary | ICD-10-CM | POA: Diagnosis not present

## 2022-05-05 DIAGNOSIS — M1712 Unilateral primary osteoarthritis, left knee: Secondary | ICD-10-CM | POA: Diagnosis not present

## 2022-05-05 NOTE — Therapy (Signed)
OUTPATIENT PHYSICAL THERAPY LOWER EXTREMITY Treatment     Patient Name: [name] MRN: [id number] DOB:1956/03/16, 67 y.o., male Today's Date: 05/05/2022    PT End of Session - 05/05/22 1103     Visit Number 16    Number of Visits 24    Date for PT Re-Evaluation 05/11/22    Authorization Type UHC Medicare; no copay, 20% co insurance; no dec, no VL, no auth required    Progress Note Due on Visit 22    PT Start Time 1116    PT Stop Time 1156    PT Time Calculation (min) 40 min    Activity Tolerance Patient tolerated treatment well;No increased pain;Patient limited by fatigue    Behavior During Therapy [facility] for tasks assessed/performed                  Past Medical History:  Diagnosis Date   Acute ST elevation myocardial infarction (STEMI) due to occlusion of mid portion of left anterior descending (LAD) coronary artery ([place]) 35/32/9924   Acute systolic heart failure (HCC)    AICD (automatic cardioverter/defibrillator) present    Arthritis    CHF (congestive heart failure) (HCC)    CKD (chronic kidney disease) stage 4, GFR 15-29 ml/min (HCC)    Diabetes mellitus, type II ([place])    Dyspnea    with exertionm periodically   Heart attack ([place]) 03/15/2019   anterior MI    History of kidney stones    Hyperparathyroidism ([place])    Hypertension 2002   Ischemic cardiomyopathy    Left knee pain    Syncope 04/04/2019   after blood draw   Vitamin D deficiency    Past Surgical History:  Procedure Laterality Date   AMPUTATION TOE Left 1991   2 digit of left foot   BIOPSY  02/26/2021   Procedure: BIOPSY;  Surgeon: Rogene Houston, MD;  [facility];  Service: Endoscopy;;   CHOLECYSTECTOMY     COLONOSCOPY     COLONOSCOPY WITH PROPOFOL N/A 02/26/2021   Procedure: COLONOSCOPY WITH PROPOFOL;  Surgeon: Rogene Houston, MD;  [facility];  Service: Endoscopy;  Laterality: N/A;   CORONARY/GRAFT ACUTE MI REVASCULARIZATION N/A 03/12/2019   Procedure:  Coronary/Graft Acute MI Revascularization;  Surgeon: Belva Crome, MD;  Location: [place] [facility];  Service: Cardiovascular;  Laterality: N/A;   ESOPHAGOGASTRODUODENOSCOPY (EGD) WITH PROPOFOL N/A 02/26/2021   Procedure: ESOPHAGOGASTRODUODENOSCOPY (EGD) WITH PROPOFOL;  Surgeon: Rogene Houston, MD;  [facility];  Service: Endoscopy;  Laterality: N/A;   ICD IMPLANT N/A 08/24/2019   Procedure: ICD IMPLANT;  Surgeon: Evans Lance, MD;  Location: [place] CV LAB;  Service: Cardiovascular;  Laterality: N/A;   IR URETERAL STENT LEFT NEW ACCESS W/O SEP NEPHROSTOMY CATH  09/26/2019   LEFT HEART CATH AND CORONARY ANGIOGRAPHY N/A 03/12/2019   Procedure: LEFT HEART CATH AND CORONARY ANGIOGRAPHY;  Surgeon: Belva Crome, MD;  Location: [place] [facility];  Service: Cardiovascular;  Laterality: N/A;   LITHOTRIPSY Right    NEPHROLITHOTOMY Left 09/26/2019   Procedure: LEFT NEPHROLITHOTOMY PERCUTANEOUS;  Surgeon: Irine Seal, MD;  Location: [facility];  Service: Urology;  Laterality: Left;   TOTAL KNEE ARTHROPLASTY Left 02/02/2022   Procedure: TOTAL KNEE ARTHROPLASTY;  Surgeon: Vickey Huger, MD;  Location: [facility];  Service: Orthopedics;  Laterality: Left;   Patient Active Problem List   Diagnosis Date Noted   S/P total knee replacement 02/02/2022   Encounter for general adult  medical examination with abnormal findings 09/17/2021   Other hyperparathyroidism ([place]) 06/13/2021   Vitamin D deficiency 06/13/2021   Nonintractable episodic headache 04/23/2021   Primary osteoarthritis of both knees 04/23/2021   Anemia due to chronic blood loss 03/05/2021   Gastric ulcer 03/05/2021   Type 2 diabetes with nephropathy ([place])    LUQ pain    Prediabetes 02/22/2021   Left renal stone 09/26/2019   CAD in native artery    Ischemic cardiomyopathy 08/15/2019   Congestive heart failure ([place]) 03/21/2019   ST elevation myocardial infarction involving left anterior descending (LAD) coronary artery (HCC)     Chronic kidney disease, stage IV (severe) ([place]) 06/11/2016   Elevated PSA 06/11/2016   Hyperlipidemia 06/11/2016   Essential hypertension 05/28/2016     PCP: Ihor Dow MD  REFERRING PROVIDER: Vickey Huger, MD Next apt 03/19/22  REFERRING DIAG: PT eval/tx for s/p lt TKA per Vickey Huger, MD  THERAPY DIAG:  Primary osteoarthritis of left knee  Difficulty in walking, not elsewhere classified  Rationale for Evaluation and Treatment Rehabilitation  ONSET DATE: 02/05/2022  SUBJECTIVE:   SUBJECTIVE STATEMENT: Left knee stiff and sore today; reports generally "doing ok" PERTINENT HISTORY: 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 Yes: NPRS scale: 4/10 Pain location: left knee Pain description: sore, aching, tightness Aggravating factors: movement Relieving factors: rest, medicine  PRECAUTIONS: None  WEIGHT BEARING RESTRICTIONS: No  FALLS:  Has patient fallen in last 6 months? No  LIVING ENVIRONMENT: Lives with: lives with their family Lives in: House/apartment Stairs: Yes: External: 2 steps; none Has following equipment at home: Walker - 2 wheeled, Wheelchair (manual), and bed side commode  OCCUPATION:   PLOF: Independent  PATIENT GOALS: walk   OBJECTIVE:   DIAGNOSTIC FINDINGS: none  PATIENT SURVEYS:  FOTO 44  03/17/22 41% function 04/13/22: 40% limited  COGNITION: Overall cognitive status: Within functional limits for tasks assessed     SENSATION: WFL  EDEMA:  Normal for this time s/p     PALPATION: Tenderness normal for this time s/p  LOWER EXTREMITY ROM:  Active ROM Right eval Left eval Left 02/23/22 Left 02/26/22 Left 03/05/02 Left 03/10/22 Left  03/12/22 Left 03/17/22 Left 03/30/22 Left 04/13/22 Left  04/15/22 Left 05/05/2022  Hip flexion              Hip extension              Hip abduction              Hip adduction              Hip internal rotation              Hip external  rotation              Knee flexion  _0 116  Knee extension  -12 -_1 Lacking 7 Lacking 5 Lacking 7 -10 (lacking) Lacking 8 Lacking 6 Lacking 6  Ankle dorsiflexion              Ankle plantarflexion              Ankle inversion              Ankle eversion               (Blank rows = not tested)  LOWER EXTREMITY MMT:  MMT Right eval Left eval Right 03/17/22  Left 03/17/22 Left 03/30/22 Left 04/13/22  Hip flexion   5 5    Hip extension        Hip abduction        Hip adduction        Hip internal rotation        Hip external rotation        Knee flexion  3- (sitting) 5 4+ 4+ 5  Knee extension  Poor to fair quad set 5 4+ 5 5  Ankle dorsiflexion   _0 Ankle plantarflexion        Ankle inversion        Ankle eversion         (Blank rows = not tested)   FUNCTIONAL TESTS:  5 times sit to stand: 02/26/22: 5STS 14.98" initially required HHA for 3 reps then able to complete without HHA for last 2, decreased eccentric contro  GAIT: Distance walked: 10 ft Assistive device utilized: Environmental consultant - 2 wheeled Level of assistance: CGA Comments: decreased heel strike and toe off; extension lag left knee; antalgic and step to gait pattern Reassessment 03/17/22: 5 times sit to stand: 10.98 seconds with use of hands on first rep, relies on momentum FOTO: 41% function  TODAY'S TREATMENT                                                                                                                             05/05/2022 RB seat 19 x 5' full revolutions dynamic warm up  Standing: Left knee flexion drives on 12" step x 2' Heel/toe raises no UE assist x 20 Squat to chair target 2 x 10 8" step ups 2 x 10 8" lateral step ups 2 x 10 Slant board 20" x 5  Bodycraft 3 plates 2 x 10 leg press Bodycraft TKE 3 plates x 20  Seated: Hamstring stretch 10" x 10 with strap  Supine: Left knee AROM -6 to 116          04/23/22 Standing: Heel/toe  raises x 20 Left knee drives x 2' on 12" step  8" step ups 2 x 10 8" lateral step ups 2 x 10 Squats to chair for target 2 x 10 Slant board 5 x 20"  RB seat 19 x 5' full revolutions  Seated: Hamstring stretch 5 x 20"  Bodycraft 3 plates x 10 leg press    04/21/22 Standing: Heel/toe raises x 20 Left knee drives x 2' on 12" step 6" step up 1 HHA 2 x 10 6" lateral step up 2 x 10 Squats to chair for target 2 x 10 Slant board 5 x 20"  Seated: Hamstring stretch with strap 10 x 10" Long sitting hamstring stretch 5 x 20"  Recumbent bike seat 19 x 5'; unable to make full revolution initially  Left knee AROM: -10 to 110 today at end of treatment      04/15/22: Heel raises Knee drive for flexion  on 12in step  10 STS  Squat front of chair for mechanics TKE 10x 5" GTB- HEP Prone: 4 min STM to hamstrings Prone knee hang x 2 min Contract relax for flexion 5x 10" holds Prone TKE 10x 5" holds Supine: Manual:Patella mobs/ STM to quad x 4 min Hamstring stretch with rope 3x 30" AROM 6-109  04/13/22 Reassessment SLR 3 x 10 LLE Step up 6 inch 2 x 10 LLE Lateral step up 6 inch 2 x 10 LLE Calf stretch on slant board 4 x 20 second holds Ambulation with SPC from 211 to lobby at EOS  04/03/22 Supine: Quad sets 5" hold x 10 SAQ's 2 x 10 Manual knee flexion and extension with PT overpressure x 10  RB seat 10 x 5' full revolutions  Backwards walking on TM x 3 sets for 1"- cues for TKE, BUE support, and L knee flexion. (Noted L knee circumduction to achieve appropriate arc of motion)  TKE's BTB x 20 Heel/toe raises x 20 Slant board 5 x 20"   03/30/22 Progress note AROM Left knee -10 to 110   Supine: Quad sets 5" x 10  Seated: Heel/toe raises x 20 Marching x 20 LAQ's 2 x 10 Manual knee flexion stretch x 8 Hamstring stretch with strap 10 x 10"  Prone hang x 2'  RB seat 10 x 5'  Ambulation down to gym and back to front of hospital without AD (encouraged  patient to bring cane)    03/24/22 Supine: Quad sets Left knee 5" x 10 STM to left knee to decrease pain and increase tissue mobility, Manual knee extension x 5  Sitting: manual knee flexion x 5  RB Precor seat 10 x 5' 1/2 revolutions  Standing: Heel/toe raises 2 x 10 Slant board 5 x 20" Knee drives on 8" box x 2 minutes Hamstring stretch with leg on 8" box 5 x 20" squat 2 x 10         03/17/22 Reassessment Step up 4 inch 1 x 10, 6 inch 2 x 15 Lateral step up 6 inch 1x 15   Squat 1 x 10   11//9/23 Supine: manual retrograde massage for edema control with LE on green ball for elevation Quad set 10 x 10 second holds Supine heel slides with belt 10 x 10 second holds Supine hamstring sets with green ball 10 x 10 second holds Step up 4 inch 2x 10 with 1 HHA Lateral step up 4 inch with 2 HHA 1 x 10   03/10/22 Supine heel slides with belt 10 x 10 second holds Quad set 10 x 10 second holds Calf stretch on slant board 4x 20 second holds HR x 20 TR x 20  TKE GTB 10 x 10 second holds Step up 4 inch step 1 x 10 with 1 HHA Prone quad stretch with strap 10 x 5 second holds Supine: manual retrograde massage for edema control    PATIENT EDUCATION:  Education details:04/13/22: reassessment, HEP, POC; 03/17/22: reassessment, HEP; Eval: Patient educated on exam findings, POC, scope of PT, HEP. Person educated: Patient Education method: Explanation, Demonstration, and Handouts Education comprehension: verbalized understanding, returned demonstration, verbal cues required, and tactile cues required   HOME EXERCISE PROGRAM: Access Code: [email or web address] URL: [email or web address] Date: 02/20/2022 Prepared by: [facility] 03/05/22: prone knee hang, heel and toe raises Exercises - Supine Ankle Pumps  - 3 x daily - 7 x weekly - 1 sets - 10 reps - Supine Quad Set  - 3  x daily - 7 x weekly - 1 sets - 10 reps - Supine Straight Leg Hip Adduction and Quad Set with Ball  - 3  x daily - 7 x weekly - 1 sets - 10 reps - Supine Heel Slides  - 3 x daily - 7 x weekly - 1 sets - 10 reps - Supine Active Straight Leg Raise  - 3 x daily - 7 x weekly - 1 sets - 10 reps - Supine Hip Abduction  - 3 x daily - 7 x weekly - 1 sets - 10 reps - Seated Knee Flexion Extension AROM   - 3 x daily - 7 x weekly - 1 sets - 10 reps - Seated Knee Flexion AAROM  - 3 x daily - 7 x weekly - 1 sets - 10 reps - Seated Long Arc Quad  - 3 x daily - 7 x weekly - 1 sets - 10 reps - Supine Knee Extension Stretch on Towel Roll  - 1 x daily - 7 x weekly - 1 sets - 1-5 min hold  04/15/22: TKE, quad and hamstring stretches  ASSESSMENT:  CLINICAL IMPRESSION: Today's session focused on left knee mobility and strengthening. Added Bodycraft TKE's today without issue.  patient continues with slight extension lag but overall progressing well. PT encouraged him to continue with prone hang at home to address extension deficit.  Good improvement with flexion.  Patient will benefit from continued skilled therapy services to address deficits and promote return to optimal function.       OBJECTIVE IMPAIRMENTS: Abnormal gait, decreased activity tolerance, decreased balance, decreased endurance, decreased mobility, difficulty walking, decreased ROM, hypomobility, increased edema, increased fascial restrictions, impaired perceived functional ability, increased muscle spasms, impaired flexibility, and pain.   ACTIVITY LIMITATIONS: carrying, lifting, bending, sitting, standing, squatting, sleeping, stairs, transfers, bed mobility, locomotion level, and caring for others  PARTICIPATION LIMITATIONS: meal prep, cleaning, laundry, driving, shopping, and community activity   REHAB POTENTIAL: Good  CLINICAL DECISION MAKING: Stable/uncomplicated  EVALUATION COMPLEXITY: Low   GOALS: Goals reviewed with patient? No  SHORT TERM GOALS: Target date: 03/06/2022  Patient will be independent with initial  HEP  Baseline: Goal status: MET  2.  Patient will increase left knee AROM in supine to -8 to 100 to improve ability to navigate steps to enter home. Baseline: see above Goal status: MET    LONG TERM GOALS: Target date: 03/20/2022   Patient will be independent in self management strategies to improve quality of life and functional outcomes.  Baseline:  Goal status: IN PROGRESS  2.  Patient will increase left leg MMTs to 5/5 without pain to promote return to ambulation community distances with minimal deviation.  Baseline: see above Goal status: IN PROGRESS  3.  Patient will improve FOTO score to predicted value  Baseline: 44 03/17/22: 41% function 04/13/22: 40% function Goal status: IN PROGRESS  4.  Patient will increase left knee AROM in supine to -2 to 120 to perform all household tasks and care for his mother without issue. Baseline: 03/30/22 -10 to 110 04/13/22: lacking 8 to 109 Goal status: IN PROGRESS  5.  Patient will perform 5 x sit to stand in 20 sec or less to demonstrate good functional mobility and increased lower extremity strength.  Baseline: 02/26/22: 5STS 14.98" initially required HHA for 3 reps then able to complete without HHA for last 2, decreased eccentric control 03/17/22 10.98 seconds with use of hands on first rep, relies on momentum Goal status: MET  PLAN:  PT FREQUENCY: 2x/week  PT DURATION: 4 weeks  PLANNED INTERVENTIONS: Therapeutic exercises, Therapeutic activity, Neuromuscular re-education, Balance training, Gait training, Patient/Family education, Joint manipulation, Joint mobilization, Stair training, Orthotic/Fit training, DME instructions, Aquatic Therapy, Dry Needling, Electrical stimulation, Spinal manipulation, Spinal mobilization, Cryotherapy, Moist heat, Compression bandaging, scar mobilization, Splintting, Taping, Traction, Ultrasound, Ionotophoresis 33m/ml Dexamethasone, and Manual therapy  PLAN FOR NEXT SESSION: reassess for  possible discharge.  11:54 AM, 05/05/22 Vasil Juhasz Small Sharley Keeler MPT [facility] #[phone]

## 2022-05-11 ENCOUNTER — Ambulatory Visit (HOSPITAL_COMMUNITY): Payer: 59 | Admitting: Physical Therapy

## 2022-05-11 ENCOUNTER — Encounter (HOSPITAL_COMMUNITY): Payer: Self-pay | Admitting: Physical Therapy

## 2022-05-11 DIAGNOSIS — R262 Difficulty in walking, not elsewhere classified: Secondary | ICD-10-CM | POA: Diagnosis not present

## 2022-05-11 DIAGNOSIS — M1712 Unilateral primary osteoarthritis, left knee: Secondary | ICD-10-CM | POA: Diagnosis not present

## 2022-05-11 NOTE — Therapy (Signed)
OUTPATIENT PHYSICAL THERAPY LOWER EXTREMITY Treatment     Patient Name: [name] MRN: [id number] DOB:1955/10/03, 67 y.o., male Today's Date: 05/11/2022  PHYSICAL THERAPY DISCHARGE SUMMARY  Visits from Start of Care: 17  Current functional level related to goals / functional outcomes: See below   Remaining deficits: See below   Education / Equipment:  See below   Patient agrees to discharge. Patient goals were partially met. Patient is being discharged due to being pleased with the current functional level.    PT End of Session - 05/11/22 0956     Visit Number 17    Number of Visits 24    Date for PT Re-Evaluation 05/11/22    Authorization Type UHC Medicare; no copay, 20% co insurance; no dec, no VL, no auth required    Progress Note Due on Visit 22    PT Start Time [phone]   arrives late   PT Stop Time 1025    PT Time Calculation (min) 29 min    Activity Tolerance Patient tolerated treatment well;No increased pain;Patient limited by fatigue    Behavior During Therapy [facility] for tasks assessed/performed                  Past Medical History:  Diagnosis Date   Acute ST elevation myocardial infarction (STEMI) due to occlusion of mid portion of left anterior descending (LAD) coronary artery ([place]) 83/41/9622   Acute systolic heart failure (HCC)    AICD (automatic cardioverter/defibrillator) present    Arthritis    CHF (congestive heart failure) (HCC)    CKD (chronic kidney disease) stage 4, GFR 15-29 ml/min (HCC)    Diabetes mellitus, type II ([place])    Dyspnea    with exertionm periodically   Heart attack ([place]) 03/15/2019   anterior MI    History of kidney stones    Hyperparathyroidism ([place])    Hypertension 2002   Ischemic cardiomyopathy    Left knee pain    Syncope 04/04/2019   after blood draw   Vitamin D deficiency    Past Surgical History:  Procedure Laterality Date   AMPUTATION TOE Left 1991   2 digit of left foot   BIOPSY  02/26/2021    Procedure: BIOPSY;  Surgeon: Rogene Houston, MD;  [facility];  Service: Endoscopy;;   CHOLECYSTECTOMY     COLONOSCOPY     COLONOSCOPY WITH PROPOFOL N/A 02/26/2021   Procedure: COLONOSCOPY WITH PROPOFOL;  Surgeon: Rogene Houston, MD;  [facility];  Service: Endoscopy;  Laterality: N/A;   CORONARY/GRAFT ACUTE MI REVASCULARIZATION N/A 03/12/2019   Procedure: Coronary/Graft Acute MI Revascularization;  Surgeon: Belva Crome, MD;  Location: [place] [facility];  Service: Cardiovascular;  Laterality: N/A;   ESOPHAGOGASTRODUODENOSCOPY (EGD) WITH PROPOFOL N/A 02/26/2021   Procedure: ESOPHAGOGASTRODUODENOSCOPY (EGD) WITH PROPOFOL;  Surgeon: Rogene Houston, MD;  [facility];  Service: Endoscopy;  Laterality: N/A;   ICD IMPLANT N/A 08/24/2019   Procedure: ICD IMPLANT;  Surgeon: Evans Lance, MD;  Location: [place] [facility];  Service: Cardiovascular;  Laterality: N/A;   IR URETERAL STENT LEFT NEW ACCESS W/O SEP NEPHROSTOMY CATH  09/26/2019   LEFT HEART CATH AND CORONARY ANGIOGRAPHY N/A 03/12/2019   Procedure: LEFT HEART CATH AND CORONARY ANGIOGRAPHY;  Surgeon: Belva Crome, MD;  Location: [place] [facility] LAB;  Service: Cardiovascular;  Laterality: N/A;   LITHOTRIPSY Right    NEPHROLITHOTOMY Left 09/26/2019   Procedure: LEFT NEPHROLITHOTOMY PERCUTANEOUS;  Surgeon:  Irine Seal, MD;  Location: [facility];  Service: Urology;  Laterality: Left;   TOTAL KNEE ARTHROPLASTY Left 02/02/2022   Procedure: TOTAL KNEE ARTHROPLASTY;  Surgeon: Vickey Huger, MD;  Location: [facility];  Service: Orthopedics;  Laterality: Left;   Patient Active Problem List   Diagnosis Date Noted   S/P total knee replacement 02/02/2022   Encounter for general adult medical examination with abnormal findings 09/17/2021   Other hyperparathyroidism ([place]) 06/13/2021   Vitamin D deficiency 06/13/2021   Nonintractable episodic headache 04/23/2021   Primary osteoarthritis of both knees 04/23/2021   Anemia  due to chronic blood loss 03/05/2021   Gastric ulcer 03/05/2021   Type 2 diabetes with nephropathy ([place])    LUQ pain    Prediabetes 02/22/2021   Left renal stone 09/26/2019   CAD in native artery    Ischemic cardiomyopathy 08/15/2019   Congestive heart failure ([place]) 03/21/2019   ST elevation myocardial infarction involving left anterior descending (LAD) coronary artery (HCC)    Chronic kidney disease, stage IV (severe) ([place]) 06/11/2016   Elevated PSA 06/11/2016   Hyperlipidemia 06/11/2016   Essential hypertension 05/28/2016     PCP: Ihor Dow MD  REFERRING PROVIDER: Vickey Huger, MD Next apt 03/19/22  REFERRING DIAG: PT eval/tx for s/p lt TKA per Vickey Huger, MD  THERAPY DIAG:  Primary osteoarthritis of left knee  Difficulty in walking, not elsewhere classified  Rationale for Evaluation and Treatment Rehabilitation  ONSET DATE: 02/05/2022  SUBJECTIVE:   SUBJECTIVE STATEMENT: Patient states knee has been doing good. Patient states 80% improvement since beginning PT. Remains limited by stairs.   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 Yes: NPRS scale: 3/10 Pain location: left knee Pain description: sore, aching, tightness Aggravating factors: movement Relieving factors: rest, medicine  PRECAUTIONS: None  WEIGHT BEARING RESTRICTIONS: No  FALLS:  Has patient fallen in last 6 months? No  LIVING ENVIRONMENT: Lives with: lives with their family Lives in: House/apartment Stairs: Yes: External: 2 steps; none Has following equipment at home: Walker - 2 wheeled, Wheelchair (manual), and bed side commode  OCCUPATION:   PLOF: Independent  PATIENT GOALS: walk   OBJECTIVE:   DIAGNOSTIC FINDINGS: none  PATIENT SURVEYS:  FOTO 44  03/17/22 41% function 04/13/22: 40% limited 05/11/22 55% function  COGNITION: Overall cognitive status: Within functional limits for tasks  assessed     SENSATION: WFL  EDEMA:  Normal for this time s/p     PALPATION: Tenderness normal for this time s/p  LOWER EXTREMITY ROM:  Active ROM Right eval Left eval Left 02/23/22 Left 02/26/22 Left 03/05/02 Left 03/10/22 Left  03/12/22 Left 03/17/22 Left 03/30/22 Left 04/13/22 Left  04/15/22 Left 05/05/2022 Left 05/11/22  Hip flexion               Hip extension               Hip abduction               Hip adduction               Hip internal rotation               Hip external rotation               Knee flexion  86 96 98 103 105 107 107 110 109 109  116 115  Knee extension  -12 -15 11 8  Lacking 7 Lacking 5 Lacking 7 -  10 (lacking) Lacking 8 Lacking 6 Lacking 6 Lacking 5  Ankle dorsiflexion               Ankle plantarflexion               Ankle inversion               Ankle eversion                (Blank rows = not tested)  LOWER EXTREMITY MMT:  MMT Right eval Left eval Right 03/17/22 Left 03/17/22 Left 03/30/22 Left 04/13/22 Left 05/11/22  Hip flexion   5 5     Hip extension         Hip abduction         Hip adduction         Hip internal rotation         Hip external rotation         Knee flexion  3- (sitting) 5 4+ 4+ 5 5  Knee extension  Poor to fair quad set 5 4+ 5 5 5   Ankle dorsiflexion   5 5 5     Ankle plantarflexion         Ankle inversion         Ankle eversion          (Blank rows = not tested)   FUNCTIONAL TESTS:  5 times sit to stand: 02/26/22: 5STS 14.98" initially required HHA for 3 reps then able to complete without HHA for last 2, decreased eccentric contro  GAIT: Distance walked: 10 ft Assistive device utilized: Environmental consultant - 2 wheeled Level of assistance: CGA Comments: decreased heel strike and toe off; extension lag left knee; antalgic and step to gait pattern Reassessment 03/17/22: 5 times sit to stand: 10.98 seconds with use of hands on first rep, relies on momentum FOTO: 41% function  TODAY'S TREATMENT                                                                                                                              05/11/22 RB seat 19 x 5' full revolutions dynamic warm up level 3 Reassessment Stairs 7 inch alternating pattern [facility]  05/05/2022 RB seat 19 x 5' full revolutions dynamic warm up  Standing: Left knee flexion drives on 12" step x 2' Heel/toe raises no UE assist x 20 Squat to chair target 2 x 10 8" step ups 2 x 10 8" lateral step ups 2 x 10 Slant board 20" x 5  Bodycraft 3 plates 2 x 10 leg press Bodycraft TKE 3 plates x 20  Seated: Hamstring stretch 10" x 10 with strap  Supine: Left knee AROM -6 to 116          04/23/22 Standing: Heel/toe raises x 20 Left knee drives x 2' on 12" step  8" step ups 2 x 10 8" lateral step ups 2 x 10 Squats to chair for target 2  x 10 Slant board 5 x 20"  RB seat 19 x 5' full revolutions  Seated: Hamstring stretch 5 x 20"  Bodycraft 3 plates x 10 leg press    04/21/22 Standing: Heel/toe raises x 20 Left knee drives x 2' on 12" step 6" step up 1 HHA 2 x 10 6" lateral step up 2 x 10 Squats to chair for target 2 x 10 Slant board 5 x 20"  Seated: Hamstring stretch with strap 10 x 10" Long sitting hamstring stretch 5 x 20"  Recumbent bike seat 19 x 5'; unable to make full revolution initially  Left knee AROM: -10 to 110 today at end of treatment      04/15/22: Heel raises Knee drive for flexion on 12in step  10 STS  Squat front of chair for mechanics TKE 10x 5" GTB- HEP Prone: 4 min STM to hamstrings Prone knee hang x 2 min Contract relax for flexion 5x 10" holds Prone TKE 10x 5" holds Supine: Manual:Patella mobs/ STM to quad x 4 min Hamstring stretch with rope 3x 30" AROM 6-109  04/13/22 Reassessment SLR 3 x 10 LLE Step up 6 inch 2 x 10 LLE Lateral step up 6 inch 2 x 10 LLE Calf stretch on slant board 4 x 20 second holds Ambulation with SPC from 211 to lobby at EOS  04/03/22 Supine: Quad sets 5" hold  x 10 SAQ's 2 x 10 Manual knee flexion and extension with PT overpressure x 10  RB seat 10 x 5' full revolutions  Backwards walking on TM x 3 sets for 1"- cues for TKE, BUE support, and L knee flexion. (Noted L knee circumduction to achieve appropriate arc of motion)  TKE's BTB x 20 Heel/toe raises x 20 Slant board 5 x 20"   03/30/22 Progress note AROM Left knee -10 to 110   Supine: Quad sets 5" x 10  Seated: Heel/toe raises x 20 Marching x 20 LAQ's 2 x 10 Manual knee flexion stretch x 8 Hamstring stretch with strap 10 x 10"  Prone hang x 2'  RB seat 10 x 5'  Ambulation down to gym and back to front of hospital without AD (encouraged patient to bring cane)    03/24/22 Supine: Quad sets Left knee 5" x 10 STM to left knee to decrease pain and increase tissue mobility, Manual knee extension x 5  Sitting: manual knee flexion x 5  RB Precor seat 10 x 5' 1/2 revolutions  Standing: Heel/toe raises 2 x 10 Slant board 5 x 20" Knee drives on 8" box x 2 minutes Hamstring stretch with leg on 8" box 5 x 20" squat 2 x 10         03/17/22 Reassessment Step up 4 inch 1 x 10, 6 inch 2 x 15 Lateral step up 6 inch 1x 15   Squat 1 x 10   11//9/23 Supine: manual retrograde massage for edema control with LE on green ball for elevation Quad set 10 x 10 second holds Supine heel slides with belt 10 x 10 second holds Supine hamstring sets with green ball 10 x 10 second holds Step up 4 inch 2x 10 with 1 HHA Lateral step up 4 inch with 2 HHA 1 x 10   03/10/22 Supine heel slides with belt 10 x 10 second holds Quad set 10 x 10 second holds Calf stretch on slant board 4x 20 second holds HR x 20 TR x 20  TKE GTB 10 x 10 second  holds Step up 4 inch step 1 x 10 with 1 HHA Prone quad stretch with strap 10 x 5 second holds Supine: manual retrograde massage for edema control    PATIENT EDUCATION:  Education details:05/11/22: reassessment findings, returning to PT if  needed.04/13/22: reassessment, HEP, POC; 03/17/22: reassessment, HEP; Eval: Patient educated on exam findings, POC, scope of PT, HEP. Person educated: Patient Education method: Explanation, Demonstration, and Handouts Education comprehension: verbalized understanding, returned demonstration, verbal cues required, and tactile cues required   HOME EXERCISE PROGRAM: Access Code: [email or web address] URL: [email or web address] Date: 02/20/2022 Prepared by: [facility] - Rehab 03/05/22: prone knee hang, heel and toe raises Exercises - Supine Ankle Pumps  - 3 x daily - 7 x weekly - 1 sets - 10 reps - Supine Quad Set  - 3 x daily - 7 x weekly - 1 sets - 10 reps - Supine Straight Leg Hip Adduction and Quad Set with Ball  - 3 x daily - 7 x weekly - 1 sets - 10 reps - Supine Heel Slides  - 3 x daily - 7 x weekly - 1 sets - 10 reps - Supine Active Straight Leg Raise  - 3 x daily - 7 x weekly - 1 sets - 10 reps - Supine Hip Abduction  - 3 x daily - 7 x weekly - 1 sets - 10 reps - Seated Knee Flexion Extension AROM   - 3 x daily - 7 x weekly - 1 sets - 10 reps - Seated Knee Flexion AAROM  - 3 x daily - 7 x weekly - 1 sets - 10 reps - Seated Long Arc Quad  - 3 x daily - 7 x weekly - 1 sets - 10 reps - Supine Knee Extension Stretch on Towel Roll  - 1 x daily - 7 x weekly - 1 sets - 1-5 min hold  04/15/22: TKE, quad and hamstring stretches  ASSESSMENT:  CLINICAL IMPRESSION: Patient has met 2/2 short term goals and 3/5 long term goals with ability to complete HEP and improvement in symptoms, strength, ROM, activity tolerance, gait, balance, and functional mobility. Remaining goals not met due to continued deficits in end range ROM with AROM from lacking 5 to 115. and functional mobility. Patient has made good progress toward remaining goals and is educated on continuing HEP to improve mobility and function. Patient discharged from physical therapy at this time.    OBJECTIVE IMPAIRMENTS: Abnormal gait,  decreased activity tolerance, decreased balance, decreased endurance, decreased mobility, difficulty walking, decreased ROM, hypomobility, increased edema, increased fascial restrictions, impaired perceived functional ability, increased muscle spasms, impaired flexibility, and pain.   ACTIVITY LIMITATIONS: carrying, lifting, bending, sitting, standing, squatting, sleeping, stairs, transfers, bed mobility, locomotion level, and caring for others  PARTICIPATION LIMITATIONS: meal prep, cleaning, laundry, driving, shopping, and community activity   REHAB POTENTIAL: Good  CLINICAL DECISION MAKING: Stable/uncomplicated  EVALUATION COMPLEXITY: Low   GOALS: Goals reviewed with patient? No  SHORT TERM GOALS: Target date: 03/06/2022  Patient will be independent with initial HEP  Baseline: Goal status: MET  2.  Patient will increase left knee AROM in supine to -8 to 100 to improve ability to navigate steps to enter home. Baseline: see above Goal status: MET    LONG TERM GOALS: Target date: 03/20/2022   Patient will be independent in self management strategies to improve quality of life and functional outcomes.  Baseline:  Goal status: MET  2.  Patient will increase left leg MMTs to 5/5 without pain to  promote return to ambulation community distances with minimal deviation.  Baseline: see above Goal status: MET  3.  Patient will improve FOTO score to predicted value  Baseline: 44 03/17/22: 41% function 04/13/22: 40% function 05/11/22 55% function Goal status: IN PROGRESS  4.  Patient will increase left knee AROM in supine to -2 to 120 to perform all household tasks and care for his mother without issue. Baseline: 03/30/22 -10 to 110 04/13/22: lacking 8 to 109 05/11/22 lacking 5 to 115 Goal status: IN PROGRESS  5.  Patient will perform 5 x sit to stand in 20 sec or less to demonstrate good functional mobility and increased lower extremity strength.  Baseline: 02/26/22: 5STS 14.98"  initially required HHA for 3 reps then able to complete without HHA for last 2, decreased eccentric control 03/17/22 10.98 seconds with use of hands on first rep, relies on momentum Goal status: MET   PLAN:  PT FREQUENCY: 2x/week  PT DURATION: 4 weeks  PLANNED INTERVENTIONS: Therapeutic exercises, Therapeutic activity, Neuromuscular re-education, Balance training, Gait training, Patient/Family education, Joint manipulation, Joint mobilization, Stair training, Orthotic/Fit training, DME instructions, Aquatic Therapy, Dry Needling, Electrical stimulation, Spinal manipulation, Spinal mobilization, Cryotherapy, Moist heat, Compression bandaging, scar mobilization, Splintting, Taping, Traction, Ultrasound, Ionotophoresis 4mg /ml Dexamethasone, and Manual therapy  PLAN FOR NEXT SESSION: n/a  9:57 AM, 05/11/22 Mearl Latin PT, DPT Physical Therapist at [facility]

## 2022-05-12 ENCOUNTER — Ambulatory Visit (HOSPITAL_COMMUNITY): Payer: Medicare Other

## 2022-05-13 ENCOUNTER — Other Ambulatory Visit: Payer: Self-pay | Admitting: Internal Medicine

## 2022-05-18 ENCOUNTER — Telehealth: Payer: Self-pay | Admitting: Internal Medicine

## 2022-05-18 NOTE — Telephone Encounter (Signed)
Patient advised.

## 2022-05-18 NOTE — Telephone Encounter (Signed)
Patient called in regard to   carvedilol (COREG) 25 MG tablet [[id number]]   Med is ,making patient dizzy. Wants a call back in regard.

## 2022-05-25 ENCOUNTER — Other Ambulatory Visit: Payer: Self-pay | Admitting: Internal Medicine

## 2022-05-25 ENCOUNTER — Telehealth: Payer: Self-pay | Admitting: Internal Medicine

## 2022-05-25 DIAGNOSIS — M170 Bilateral primary osteoarthritis of knee: Secondary | ICD-10-CM

## 2022-05-25 DIAGNOSIS — G894 Chronic pain syndrome: Secondary | ICD-10-CM

## 2022-05-25 MED ORDER — OXYCODONE-ACETAMINOPHEN 10-325 MG PO TABS: 1.0000 | ORAL_TABLET | Freq: Two times a day (BID) | ORAL | 0 refills | Status: DC | PRN

## 2022-05-25 NOTE — Telephone Encounter (Signed)
Patient needs refill on     oxyCODONE-acetaminophen (PERCOCET) 10-325 MG tablet   [facility] - [place], [place] - [address], [place] [address] Phone: [phone]  Fax: [phone] DEA #: -- DAW Reason: --

## 2022-05-26 ENCOUNTER — Telehealth: Payer: Self-pay | Admitting: Internal Medicine

## 2022-05-26 MED ORDER — OXYCODONE HCL 10 MG PO TABS: 10.0000 mg | ORAL_TABLET | Freq: Two times a day (BID) | ORAL | 0 refills | Status: DC | PRN

## 2022-05-26 NOTE — Addendum Note (Signed)
Addended byIhor Dow on: 05/26/2022 09:46 AM   Modules accepted: Orders

## 2022-05-26 NOTE — Telephone Encounter (Signed)
Patient called in requesting prescription for   OXYCODONE 10mg    Does not want to have the acetaminophen 325 added to med.   Patient wants a call back.

## 2022-05-29 ENCOUNTER — Ambulatory Visit (INDEPENDENT_AMBULATORY_CARE_PROVIDER_SITE_OTHER): Payer: 59

## 2022-05-29 DIAGNOSIS — I255 Ischemic cardiomyopathy: Secondary | ICD-10-CM | POA: Diagnosis not present

## 2022-06-01 ENCOUNTER — Ambulatory Visit (INDEPENDENT_AMBULATORY_CARE_PROVIDER_SITE_OTHER): Payer: 59 | Admitting: Internal Medicine

## 2022-06-01 ENCOUNTER — Encounter: Payer: Self-pay | Admitting: Internal Medicine

## 2022-06-01 VITALS — BP 136/84 | HR 66 | Ht 72.0 in | Wt 213.0 lb

## 2022-06-01 DIAGNOSIS — M170 Bilateral primary osteoarthritis of knee: Secondary | ICD-10-CM | POA: Diagnosis not present

## 2022-06-01 DIAGNOSIS — I5022 Chronic systolic (congestive) heart failure: Secondary | ICD-10-CM | POA: Diagnosis not present

## 2022-06-01 DIAGNOSIS — R7303 Prediabetes: Secondary | ICD-10-CM

## 2022-06-01 DIAGNOSIS — N184 Chronic kidney disease, stage 4 (severe): Secondary | ICD-10-CM

## 2022-06-01 DIAGNOSIS — G894 Chronic pain syndrome: Secondary | ICD-10-CM | POA: Diagnosis not present

## 2022-06-01 DIAGNOSIS — E1121 Type 2 diabetes mellitus with diabetic nephropathy: Secondary | ICD-10-CM | POA: Diagnosis not present

## 2022-06-01 DIAGNOSIS — I10 Essential (primary) hypertension: Secondary | ICD-10-CM

## 2022-06-01 MED ORDER — CARVEDILOL 25 MG PO TABS: ORAL_TABLET | ORAL | 1 refills | Status: DC

## 2022-06-01 NOTE — Assessment & Plan Note (Signed)
Progressive CKD stage 4 Follows up with Nephrology On Sensipar and sodium bicarb tablets Avoid nephrotoxic agents, including NSAIDs

## 2022-06-01 NOTE — Assessment & Plan Note (Addendum)
Lab Results  Component Value Date   HGBA1C 5.8 (H) 01/23/2022   Diet controlled On statin Would be a good candidate for SGLT2i, but GFR close to 20 now Had AKI from ARB (?), held by Nephrology

## 2022-06-01 NOTE — Patient Instructions (Signed)
Please continue taking medications as prescribed.  Please continue to follow low salt diet and ambulate as tolerated. 

## 2022-06-01 NOTE — Assessment & Plan Note (Addendum)
S/p left TKA Has seen Orthopedic surgery, planning to get right TKA Has had steroid injection in the past Unable to take NSAIDs due to CKD and CAD Was on Percocet currently, refilled Oxycodone for now due to short-supply of Percocet, advised to take it BID PRN and avoid TID dose

## 2022-06-01 NOTE — Assessment & Plan Note (Signed)
BP Readings from Last 1 Encounters:  06/01/22 136/84   Well-controlled, followed by Nephrology Adjusted dose of Coreg to 25 mg QAM and 12.5 mg QPM now due to dizziness Counseled for compliance with the medications Advised DASH diet and ambulation as tolerated

## 2022-06-01 NOTE — Assessment & Plan Note (Signed)
Lab Results  Component Value Date   HGBA1C 5.8 (H) 01/23/2022   Advised to follow low carb diet for now

## 2022-06-01 NOTE — Assessment & Plan Note (Signed)
Appears euvolemic On Coreg, Corlanor and Lasix On Aspirin and statin for CAD F/u with Cardiology

## 2022-06-01 NOTE — Progress Notes (Deleted)
Established Patient Office Visit  Subjective:  Patient ID: [name], male    DOB: August 16, 1955  Age: 67 y.o. MRN: [id number]  CC:  Chief Complaint  Patient presents with   Hypertension    Two month follow up for hypertension and chronic pain      HPI Bjorn Hallas is a 67 y.o. male with past medical history of CAD s/p CABG, ischemic cardiomyopathy, CKD and OA who presents for f/u of his chronic medical conditions.  He had left TKA for OA of left knee.  He was given oxycodone 10 mg initially, but dose was recently decreased to 5 mg and he complains of severe knee pain now.  He is planned to get right TKA for OA of knee.  He is taking Percocet 10-325 mg as needed for severe knee pain.  He is not able to take oral NSAIDs due to his history of CKD and CAD.  He is currently on DAPT and statin for CAD. BP is well-controlled now. He was having dizziness with Coreg 25 mg BID. He was advised to take only half-tablet in PM instead of full tablet, which has resolved his dizziness. Takes medications regularly. Patient denies headache, dizziness, chest pain, dyspnea or palpitations.    Past Medical History:  Diagnosis Date   Acute ST elevation myocardial infarction (STEMI) due to occlusion of mid portion of left anterior descending (LAD) coronary artery ([place]) 42/68/3419   Acute systolic heart failure (HCC)    AICD (automatic cardioverter/defibrillator) present    Arthritis    CHF (congestive heart failure) (HCC)    CKD (chronic kidney disease) stage 4, GFR 15-29 ml/min (HCC)    Diabetes mellitus, type II ([place])    Dyspnea    with exertionm periodically   Heart attack ([place]) 03/15/2019   anterior MI    History of kidney stones    Hyperparathyroidism ([place])    Hypertension 2002   Ischemic cardiomyopathy    Left knee pain    Syncope 04/04/2019   after blood draw   Vitamin D deficiency     Past Surgical History:  Procedure Laterality Date   AMPUTATION TOE Left 1991   2 digit of  left foot   BIOPSY  02/26/2021   Procedure: BIOPSY;  Surgeon: Rogene Houston, MD;  [facility]: [facility];  Service: Endoscopy;;   CHOLECYSTECTOMY     COLONOSCOPY     COLONOSCOPY WITH PROPOFOL N/A 02/26/2021   Procedure: COLONOSCOPY WITH PROPOFOL;  Surgeon: Rogene Houston, MD;  [facility];  Service: Endoscopy;  Laterality: N/A;   CORONARY/GRAFT ACUTE MI REVASCULARIZATION N/A 03/12/2019   Procedure: Coronary/Graft Acute MI Revascularization;  Surgeon: Belva Crome, MD;  Location: [facility];  Service: Cardiovascular;  Laterality: N/A;   ESOPHAGOGASTRODUODENOSCOPY (EGD) WITH PROPOFOL N/A 02/26/2021   Procedure: ESOPHAGOGASTRODUODENOSCOPY (EGD) WITH PROPOFOL;  Surgeon: Rogene Houston, MD;  [facility];  Service: Endoscopy;  Laterality: N/A;   ICD IMPLANT N/A 08/24/2019   Procedure: ICD IMPLANT;  Surgeon: Evans Lance, MD;  Location: [facility];  Service: Cardiovascular;  Laterality: N/A;   IR URETERAL STENT LEFT NEW ACCESS W/O SEP NEPHROSTOMY CATH  09/26/2019   LEFT HEART CATH AND CORONARY ANGIOGRAPHY N/A 03/12/2019   Procedure: LEFT HEART CATH AND CORONARY ANGIOGRAPHY;  Surgeon: Belva Crome, MD;  Location: [facility];  Service: Cardiovascular;  Laterality: N/A;   LITHOTRIPSY Right    NEPHROLITHOTOMY Left 09/26/2019   Procedure: LEFT  NEPHROLITHOTOMY PERCUTANEOUS;  Surgeon: Irine Seal, MD;  Location: [facility];  Service: Urology;  Laterality: Left;   TOTAL KNEE ARTHROPLASTY Left 02/02/2022   Procedure: TOTAL KNEE ARTHROPLASTY;  Surgeon: Vickey Huger, MD;  Location: [facility];  Service: Orthopedics;  Laterality: Left;    Family History  Problem Relation Age of Onset   Heart failure Mother    Heart disease Mother    Cancer Mother        breast   Heart attack Father    Hypertension Father    Heart disease Father    Diabetes Brother     Social History   Socioeconomic History   Marital status: Single    Spouse name: Not on file    Number of children: 0   Years of education: Not on file   Highest education level: Some college, no degree  Occupational History   Occupation: retired  Tobacco Use   Smoking status: Former    Packs/day: 0.50    Years: 40.00    Total pack years: 20.00    Types: Cigarettes    Quit date: 03/12/2019    Years since quitting: 3.2   Smokeless tobacco: Never  Vaping Use   Vaping Use: Never used  Substance and Sexual Activity   Alcohol use: No   Drug use: No   Sexual activity: Not Currently  Other Topics Concern   Not on file  Social History Narrative   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       Enjoys: fishing, shopping, working around the house       Diet: working on changes, avoiding fried foods, increased veggies   Caffeine: teas some daily   Water: 6-8 cups daily       Wears seat belt    Does not use phone while driving    Oceanographer at home    Social Determinants of Health   Financial Resource Strain: [place]  (01/19/2022)   Overall Financial Resource Strain (CARDIA)    Difficulty of Paying Living Expenses: Not hard at all  Food Insecurity: No Food Insecurity (04/03/2022)   Hunger Vital Sign    Worried About Running Out of Food in the Last Year: Never true    [place] in the Last Year: Never true  Transportation Needs: No Transportation Needs (02/02/2022)   PRAPARE - Hydrologist (Medical): No    Lack of Transportation (Non-Medical): No  Physical Activity: Sufficiently Active (01/19/2022)   Exercise Vital Sign    Days of Exercise per Week: 7 days    Minutes of Exercise per Session: 30 min  Stress: No Stress Concern Present (01/19/2022)   [place]    Feeling of Stress : Not at all  Social Connections: Moderately Isolated (01/19/2022)   Social Connection and Isolation Panel [NHANES]    Frequency of Communication with Friends and Family: More than three times a  week    Frequency of Social Gatherings with Friends and Family: More than three times a week    Attends Religious Services: More than 4 times per year    Active Member of [employer] or Organizations: No    Attends Archivist Meetings: Never    Marital Status: Never married  Intimate Partner Violence: Not At Risk (02/02/2022)   Humiliation, Afraid, Rape, and Kick questionnaire    Fear of Current or Ex-Partner: No    Emotionally Abused: No  Physically Abused: No    Sexually Abused: No    Outpatient Medications Prior to Visit  Medication Sig Dispense Refill   IBU 600 MG tablet Take 1,200 mg by mouth 2 (two) times daily.     telmisartan (MICARDIS) 20 MG tablet Take 10 mg by mouth daily.     allopurinol (ZYLOPRIM) 100 MG tablet Take 100 mg by mouth daily.      aspirin 81 MG chewable tablet Chew 1 tablet (81 mg total) by mouth 2 (two) times daily. 60 tablet 0   atorvastatin (LIPITOR) 80 MG tablet TAKE 1 TABLET BY MOUTH DAILY AT 6PM 90 tablet 0   BRILINTA 60 MG TABS tablet TAKE (1) TABLET BY MOUTH TWICE DAILY. 180 tablet 3   Cholecalciferol (VITAMIN D) 50 MCG (2000 UT) CAPS Take 2,000 Units by mouth daily with breakfast.     cinacalcet (SENSIPAR) 30 MG tablet Take 30 mg by mouth daily.     diclofenac Sodium (VOLTAREN) 1 % GEL Apply 2 g topically 4 (four) times daily. 50 g 0   furosemide (LASIX) 40 MG tablet Take 40 mg by mouth daily as needed for fluid.     ivabradine (CORLANOR) 5 MG TABS tablet Take 1 tablet (5 mg total) by mouth 2 (two) times daily with a meal. 60 tablet 0   lidocaine (XYLOCAINE) 2 % solution Use as directed 10 mLs in the mouth or throat every 3 (three) hours as needed for mouth pain. 100 mL 0   methocarbamol (ROBAXIN) 500 MG tablet Take 1-2 tablets (500-1,000 mg total) by mouth every 6 (six) hours as needed for muscle spasms. 60 tablet 0   nitroGLYCERIN (NITROSTAT) 0.4 MG SL tablet Place 1 tablet (0.4 mg total) under the tongue every 5 (five) minutes as needed. 25  tablet 2   Oxycodone HCl 10 MG TABS Take 1 tablet (10 mg total) by mouth 2 (two) times daily as needed (Severe pain). 60 tablet 0   pantoprazole (PROTONIX) 40 MG tablet Take 1 tablet (40 mg total) by mouth 2 (two) times daily before a meal. 60 tablet 11   sodium bicarbonate 650 MG tablet Take 650 mg by mouth 2 (two) times daily.     carvedilol (COREG) 25 MG tablet Take 1 tablet (25 mg total) by mouth 2 (two) times daily with a meal. 180 tablet 1   No facility-administered medications prior to visit.    No Known Allergies  ROS Review of Systems  Constitutional:  Negative for chills and fever.  HENT:  Negative for ear discharge and sore throat.   Eyes:  Negative for pain and discharge.  Respiratory:  Negative for cough and shortness of breath.   Cardiovascular:  Negative for chest pain and palpitations.  Gastrointestinal:  Negative for constipation, diarrhea, nausea and vomiting.  Endocrine: Negative for polydipsia and polyuria.  Genitourinary:  Negative for dysuria and hematuria.  Musculoskeletal:  Positive for arthralgias and back pain. Negative for neck pain and neck stiffness.  Skin:  Negative for rash.  Neurological:  Negative for dizziness, weakness and numbness.  Psychiatric/Behavioral:  Negative for agitation and behavioral problems.       Objective:    Physical Exam Vitals reviewed.  Constitutional:      General: He is not in acute distress.    Appearance: He is not diaphoretic.  HENT:     Head: Normocephalic and atraumatic.     Right Ear: There is no impacted cerumen.     Nose: Nose normal.  Mouth/Throat:     Mouth: Mucous membranes are moist.  Eyes:     General: No scleral icterus.    Extraocular Movements: Extraocular movements intact.  Cardiovascular:     Rate and Rhythm: Normal rate and regular rhythm.     Pulses: Normal pulses.     Heart sounds: Normal heart sounds. No murmur heard. Pulmonary:     Breath sounds: Normal breath sounds. No wheezing or  rales.  Abdominal:     Palpations: Abdomen is soft.     Tenderness: There is no abdominal tenderness.  Musculoskeletal:        General: Tenderness (B/l knee, with mild swelling) present.     Cervical back: Neck supple. No tenderness.     Right lower leg: No edema.     Left lower leg: No edema.  Skin:    General: Skin is warm.     Findings: No rash.  Neurological:     General: No focal deficit present.     Mental Status: He is alert and oriented to person, place, and time.     Cranial Nerves: No cranial nerve deficit.     Sensory: No sensory deficit.     Motor: No weakness.  Psychiatric:        Mood and Affect: Mood normal.        Behavior: Behavior normal.     BP 136/84 (BP Location: Left Arm, Cuff Size: Normal)   Pulse 66   Ht 6' (1.829 m)   Wt 213 lb (96.6 kg)   SpO2 94%   BMI 28.89 kg/m  Wt Readings from Last 3 Encounters:  06/01/22 213 lb (96.6 kg)  03/31/22 211 lb 9.6 oz (96 kg)  02/02/22 211 lb (95.7 kg)    Lab Results  Component Value Date   TSH 0.993 08/28/2020   Lab Results  Component Value Date   WBC 4.7 02/02/2022   HGB 11.6 (L) 02/02/2022   HCT 35.1 (L) 02/02/2022   MCV 95.1 02/02/2022   PLT 163 02/02/2022   Lab Results  Component Value Date   NA 140 02/02/2022   K 4.3 02/02/2022   CO2 22 02/02/2022   GLUCOSE 105 (H) 02/02/2022   BUN 29 (H) 02/02/2022   CREATININE 2.86 (H) 02/02/2022   BILITOT 1.4 (H) 02/02/2022   ALKPHOS 95 02/02/2022   AST 19 02/02/2022   ALT 15 02/02/2022   PROT 7.0 02/02/2022   ALBUMIN 3.3 (L) 02/02/2022   CALCIUM 10.5 (H) 02/02/2022   ANIONGAP 5 02/02/2022   EGFR 26 (L) 08/28/2020   Lab Results  Component Value Date   CHOL 126 08/28/2020   Lab Results  Component Value Date   HDL 52 08/28/2020   Lab Results  Component Value Date   LDLCALC 53 08/28/2020   Lab Results  Component Value Date   TRIG 118 08/28/2020   Lab Results  Component Value Date   CHOLHDL 2.4 08/28/2020   Lab Results  Component  Value Date   HGBA1C 5.8 (H) 01/23/2022      Assessment & Plan:   Problem List Items Addressed This Visit       Cardiovascular and Mediastinum   Essential hypertension - Primary   Relevant Medications   telmisartan (MICARDIS) 20 MG tablet   carvedilol (COREG) 25 MG tablet   Congestive heart failure (HCC)    Appears euvolemic On Coreg, Corlanor and Lasix On Aspirin and statin for CAD F/u with Cardiology      Relevant Medications  telmisartan (MICARDIS) 20 MG tablet   carvedilol (COREG) 25 MG tablet     Endocrine   Type 2 diabetes with nephropathy (HCC)    Lab Results  Component Value Date   HGBA1C 5.8 (H) 01/23/2022   Diet controlled On statin Would be a good candidate for SGLT2i, but GFR close to 20 now      Relevant Medications   telmisartan (MICARDIS) 20 MG tablet     Musculoskeletal and Integument   Primary osteoarthritis of both knees    S/p left TKA Has seen Orthopedic surgery Has had steroid injection in the past Unable to take NSAIDs due to CKD and CAD On Percocet currently, refilled Oxycodone for now due to short-supply of Percocet, advised to take it BID PRN and avoid TID dose      Relevant Medications   IBU 600 MG tablet     Genitourinary   Chronic kidney disease, stage IV (severe) (HCC)    Progressive CKD stage 4 Follows up with Nephrology On Sensipar and sodium bicarb tablets Avoid nephrotoxic agents, including NSAIDs        Other   Prediabetes    Lab Results  Component Value Date   HGBA1C 5.8 (H) 01/23/2022   Advised to follow low carb diet for now      Chronic pain syndrome    From OA of knee and DDD of lumbar spine On Percocet for severe pain      Relevant Medications   IBU 600 MG tablet    Meds ordered this encounter  Medications   carvedilol (COREG) 25 MG tablet    Sig: Take 1 tablet (25 mg total) by mouth in the morning AND 0.5 tablets (12.5 mg total) every evening.    Dispense:  135 tablet    Refill:  1      Follow-up: Return in about 4 months (around 09/30/2022) for Annual physical.    Lindell Spar, MD

## 2022-06-01 NOTE — Assessment & Plan Note (Signed)
From OA of knee and DDD of lumbar spine On Percocet for severe pain

## 2022-06-01 NOTE — Progress Notes (Signed)
Established Patient Office Visit  Subjective:  Patient ID: [name], male    DOB: 1956-01-14  Age: 67 y.o. MRN: [id number]  CC:  Chief Complaint  Patient presents with   Hypertension    Two month follow up for hypertension and chronic pain     HPI [name] is a 67 y.o. male with past medical history of CAD s/p CABG, ischemic cardiomyopathy, CKD and OA who presents for f/u of his chronic medical conditions.  He had left TKA for OA of left knee.  He was given oxycodone 10 mg initially, but dose was recently decreased to 5 mg and he complains of severe knee pain now.  He is planned to get right TKA for OA of knee.  He is taking Percocet 10-325 mg as needed for severe knee pain.  He is not able to take oral NSAIDs due to his history of CKD and CAD.  He is currently on DAPT and statin for CAD. BP is well-controlled now. He was having dizziness with Coreg 25 mg BID. He was advised to take only half-tablet in PM instead of full tablet, which has resolved his dizziness. Takes medications regularly. Patient denies headache, dizziness, chest pain, dyspnea or palpitations.    Past Medical History:  Diagnosis Date   Acute ST elevation myocardial infarction (STEMI) due to occlusion of mid portion of left anterior descending (LAD) coronary artery ([place]) 89/38/1017   Acute systolic heart failure (HCC)    AICD (automatic cardioverter/defibrillator) present    Arthritis    CHF (congestive heart failure) (HCC)    CKD (chronic kidney disease) stage 4, GFR 15-29 ml/min (HCC)    Diabetes mellitus, type II ([place])    Dyspnea    with exertionm periodically   Heart attack ([place]) 03/15/2019   anterior MI    History of kidney stones    Hyperparathyroidism ([place])    Hypertension 2002   Ischemic cardiomyopathy    Left knee pain    Syncope 04/04/2019   after blood draw   Vitamin D deficiency     Past Surgical History:  Procedure Laterality Date   AMPUTATION TOE Left 1991   2 digit of  left foot   BIOPSY  02/26/2021   Procedure: BIOPSY;  Surgeon: Rogene Houston, MD;  [facility];  Service: Endoscopy;;   CHOLECYSTECTOMY     COLONOSCOPY     COLONOSCOPY WITH PROPOFOL N/A 02/26/2021   Procedure: COLONOSCOPY WITH PROPOFOL;  Surgeon: Rogene Houston, MD;  [facility];  Service: Endoscopy;  Laterality: N/A;   CORONARY/GRAFT ACUTE MI REVASCULARIZATION N/A 03/12/2019   Procedure: Coronary/Graft Acute MI Revascularization;  Surgeon: Belva Crome, MD;  Location: [facility];  Service: Cardiovascular;  Laterality: N/A;   ESOPHAGOGASTRODUODENOSCOPY (EGD) WITH PROPOFOL N/A 02/26/2021   Procedure: ESOPHAGOGASTRODUODENOSCOPY (EGD) WITH PROPOFOL;  Surgeon: Rogene Houston, MD;  [facility];  Service: Endoscopy;  Laterality: N/A;   ICD IMPLANT N/A 08/24/2019   Procedure: ICD IMPLANT;  Surgeon: Evans Lance, MD;  Location: [facility] [facility];  Service: Cardiovascular;  Laterality: N/A;   IR URETERAL STENT LEFT NEW ACCESS W/O SEP NEPHROSTOMY CATH  09/26/2019   LEFT HEART CATH AND CORONARY ANGIOGRAPHY N/A 03/12/2019   Procedure: LEFT HEART CATH AND CORONARY ANGIOGRAPHY;  Surgeon: Belva Crome, MD;  Location: [facility];  Service: Cardiovascular;  Laterality: N/A;   LITHOTRIPSY Right    NEPHROLITHOTOMY Left 09/26/2019   Procedure: LEFT NEPHROLITHOTOMY  PERCUTANEOUS;  Surgeon: Irine Seal, MD;  Location: [facility];  Service: Urology;  Laterality: Left;   TOTAL KNEE ARTHROPLASTY Left 02/02/2022   Procedure: TOTAL KNEE ARTHROPLASTY;  Surgeon: Vickey Huger, MD;  Location: [facility];  Service: Orthopedics;  Laterality: Left;    Family History  Problem Relation Age of Onset   Heart failure Mother    Heart disease Mother    Cancer Mother        breast   Heart attack Father    Hypertension Father    Heart disease Father    Diabetes Brother     Social History   Socioeconomic History   Marital status: Single    Spouse name: Not on file    Number of children: 0   Years of education: Not on file   Highest education level: Some college, no degree  Occupational History   Occupation: retired  Tobacco Use   Smoking status: Former    Packs/day: 0.50    Years: 40.00    Total pack years: 20.00    Types: Cigarettes    Quit date: 03/12/2019    Years since quitting: 3.2   Smokeless tobacco: Never  Vaping Use   Vaping Use: Never used  Substance and Sexual Activity   Alcohol use: No   Drug use: No   Sexual activity: Not Currently  Other Topics Concern   Not on file  Social History Narrative   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       Enjoys: fishing, shopping, working around the house       Diet: working on changes, avoiding fried foods, increased veggies   Caffeine: teas some daily   Water: 6-8 cups daily       Wears seat belt    Does not use phone while driving    Oceanographer at home    Social Determinants of Health   Financial Resource Strain: Industry  (01/19/2022)   Overall Financial Resource Strain (CARDIA)    Difficulty of Paying Living Expenses: Not hard at all  Food Insecurity: No Food Insecurity (04/03/2022)   Hunger Vital Sign    Worried About Running Out of Food in the Last Year: Never true    [place] in the Last Year: Never true  Transportation Needs: No Transportation Needs (02/02/2022)   PRAPARE - Hydrologist (Medical): No    Lack of Transportation (Non-Medical): No  Physical Activity: Sufficiently Active (01/19/2022)   Exercise Vital Sign    Days of Exercise per Week: 7 days    Minutes of Exercise per Session: 30 min  Stress: No Stress Concern Present (01/19/2022)   [place]    Feeling of Stress : Not at all  Social Connections: Moderately Isolated (01/19/2022)   Social Connection and Isolation Panel [NHANES]    Frequency of Communication with Friends and Family: More than three times a  week    Frequency of Social Gatherings with Friends and Family: More than three times a week    Attends Religious Services: More than 4 times per year    Active Member of [employer] or Organizations: No    Attends Archivist Meetings: Never    Marital Status: Never married  Intimate Partner Violence: Not At Risk (02/02/2022)   Humiliation, Afraid, Rape, and Kick questionnaire    Fear of Current or Ex-Partner: No    Emotionally Abused: No  Physically Abused: No    Sexually Abused: No    Outpatient Medications Prior to Visit  Medication Sig Dispense Refill   telmisartan (MICARDIS) 20 MG tablet Take 10 mg by mouth daily.     IBU 600 MG tablet Take 1,200 mg by mouth 2 (two) times daily.     allopurinol (ZYLOPRIM) 100 MG tablet Take 100 mg by mouth daily.      aspirin 81 MG chewable tablet Chew 1 tablet (81 mg total) by mouth 2 (two) times daily. 60 tablet 0   atorvastatin (LIPITOR) 80 MG tablet TAKE 1 TABLET BY MOUTH DAILY AT 6PM 90 tablet 0   BRILINTA 60 MG TABS tablet TAKE (1) TABLET BY MOUTH TWICE DAILY. 180 tablet 3   Cholecalciferol (VITAMIN D) 50 MCG (2000 UT) CAPS Take 2,000 Units by mouth daily with breakfast.     cinacalcet (SENSIPAR) 30 MG tablet Take 30 mg by mouth daily.     diclofenac Sodium (VOLTAREN) 1 % GEL Apply 2 g topically 4 (four) times daily. 50 g 0   furosemide (LASIX) 40 MG tablet Take 40 mg by mouth daily as needed for fluid.     ivabradine (CORLANOR) 5 MG TABS tablet Take 1 tablet (5 mg total) by mouth 2 (two) times daily with a meal. 60 tablet 0   lidocaine (XYLOCAINE) 2 % solution Use as directed 10 mLs in the mouth or throat every 3 (three) hours as needed for mouth pain. 100 mL 0   methocarbamol (ROBAXIN) 500 MG tablet Take 1-2 tablets (500-1,000 mg total) by mouth every 6 (six) hours as needed for muscle spasms. 60 tablet 0   nitroGLYCERIN (NITROSTAT) 0.4 MG SL tablet Place 1 tablet (0.4 mg total) under the tongue every 5 (five) minutes as needed. 25  tablet 2   Oxycodone HCl 10 MG TABS Take 1 tablet (10 mg total) by mouth 2 (two) times daily as needed (Severe pain). 60 tablet 0   pantoprazole (PROTONIX) 40 MG tablet Take 1 tablet (40 mg total) by mouth 2 (two) times daily before a meal. 60 tablet 11   sodium bicarbonate 650 MG tablet Take 650 mg by mouth 2 (two) times daily.     carvedilol (COREG) 25 MG tablet Take 1 tablet (25 mg total) by mouth 2 (two) times daily with a meal. 180 tablet 1   No facility-administered medications prior to visit.    No Known Allergies  ROS Review of Systems  Constitutional:  Negative for chills and fever.  HENT:  Negative for ear discharge and sore throat.   Eyes:  Negative for pain and discharge.  Respiratory:  Negative for cough and shortness of breath.   Cardiovascular:  Negative for chest pain and palpitations.  Gastrointestinal:  Negative for constipation, diarrhea, nausea and vomiting.  Endocrine: Negative for polydipsia and polyuria.  Genitourinary:  Negative for dysuria and hematuria.  Musculoskeletal:  Positive for arthralgias and back pain. Negative for neck pain and neck stiffness.  Skin:  Negative for rash.  Neurological:  Negative for dizziness, weakness and numbness.  Psychiatric/Behavioral:  Negative for agitation and behavioral problems.       Objective:    Physical Exam Vitals reviewed.  Constitutional:      General: He is not in acute distress.    Appearance: He is not diaphoretic.  HENT:     Head: Normocephalic and atraumatic.     Right Ear: There is no impacted cerumen.     Nose: Nose normal.  Mouth/Throat:     Mouth: Mucous membranes are moist.  Eyes:     General: No scleral icterus.    Extraocular Movements: Extraocular movements intact.  Cardiovascular:     Rate and Rhythm: Normal rate and regular rhythm.     Pulses: Normal pulses.     Heart sounds: Normal heart sounds. No murmur heard. Pulmonary:     Breath sounds: Normal breath sounds. No wheezing or  rales.  Abdominal:     Palpations: Abdomen is soft.     Tenderness: There is no abdominal tenderness.  Musculoskeletal:        General: Tenderness (B/l knee, with mild swelling) present.     Cervical back: Neck supple. No tenderness.     Right lower leg: No edema.     Left lower leg: No edema.  Skin:    General: Skin is warm.     Findings: No rash.  Neurological:     General: No focal deficit present.     Mental Status: He is alert and oriented to person, place, and time.     Cranial Nerves: No cranial nerve deficit.     Sensory: No sensory deficit.     Motor: No weakness.  Psychiatric:        Mood and Affect: Mood normal.        Behavior: Behavior normal.     BP 136/84 (BP Location: Left Arm, Cuff Size: Normal)   Pulse 66   Ht 6' (1.829 m)   Wt 213 lb (96.6 kg)   SpO2 94%   BMI 28.89 kg/m  Wt Readings from Last 3 Encounters:  06/01/22 213 lb (96.6 kg)  03/31/22 211 lb 9.6 oz (96 kg)  02/02/22 211 lb (95.7 kg)    Lab Results  Component Value Date   TSH 0.993 08/28/2020   Lab Results  Component Value Date   WBC 4.7 02/02/2022   HGB 11.6 (L) 02/02/2022   HCT 35.1 (L) 02/02/2022   MCV 95.1 02/02/2022   PLT 163 02/02/2022   Lab Results  Component Value Date   NA 140 02/02/2022   K 4.3 02/02/2022   CO2 22 02/02/2022   GLUCOSE 105 (H) 02/02/2022   BUN 29 (H) 02/02/2022   CREATININE 2.86 (H) 02/02/2022   BILITOT 1.4 (H) 02/02/2022   ALKPHOS 95 02/02/2022   AST 19 02/02/2022   ALT 15 02/02/2022   PROT 7.0 02/02/2022   ALBUMIN 3.3 (L) 02/02/2022   CALCIUM 10.5 (H) 02/02/2022   ANIONGAP 5 02/02/2022   EGFR 26 (L) 08/28/2020   Lab Results  Component Value Date   CHOL 126 08/28/2020   Lab Results  Component Value Date   HDL 52 08/28/2020   Lab Results  Component Value Date   LDLCALC 53 08/28/2020   Lab Results  Component Value Date   TRIG 118 08/28/2020   Lab Results  Component Value Date   CHOLHDL 2.4 08/28/2020   Lab Results  Component  Value Date   HGBA1C 5.8 (H) 01/23/2022      Assessment & Plan:   Problem List Items Addressed This Visit       Cardiovascular and Mediastinum   Essential hypertension - Primary    BP Readings from Last 1 Encounters:  06/01/22 136/84  Well-controlled, followed by Nephrology Adjusted dose of Coreg to 25 mg QAM and 12.5 mg QPM now due to dizziness Counseled for compliance with the medications Advised DASH diet and ambulation as tolerated  Relevant Medications   telmisartan (MICARDIS) 20 MG tablet   carvedilol (COREG) 25 MG tablet   Congestive heart failure (HCC)    Appears euvolemic On Coreg, Corlanor and Lasix On Aspirin and statin for CAD F/u with Cardiology      Relevant Medications   telmisartan (MICARDIS) 20 MG tablet   carvedilol (COREG) 25 MG tablet     Endocrine   Type 2 diabetes with nephropathy (HCC)    Lab Results  Component Value Date   HGBA1C 5.8 (H) 01/23/2022  Diet controlled On statin Would be a good candidate for SGLT2i, but GFR close to 20 now Had AKI from ARB (?), held by Nephrology      Relevant Medications   telmisartan (MICARDIS) 20 MG tablet     Musculoskeletal and Integument   Primary osteoarthritis of both knees    S/p left TKA Has seen Orthopedic surgery, planning to get right TKA Has had steroid injection in the past Unable to take NSAIDs due to CKD and CAD Was on Percocet currently, refilled Oxycodone for now due to short-supply of Percocet, advised to take it BID PRN and avoid TID dose        Genitourinary   Chronic kidney disease, stage IV (severe) ([place])    Progressive CKD stage 4 Follows up with Nephrology On Sensipar and sodium bicarb tablets Avoid nephrotoxic agents, including NSAIDs        Other   Prediabetes    Lab Results  Component Value Date   HGBA1C 5.8 (H) 01/23/2022  Advised to follow low carb diet for now      Chronic pain syndrome    From OA of knee and DDD of lumbar spine On Percocet for severe  pain      Meds ordered this encounter  Medications   carvedilol (COREG) 25 MG tablet    Sig: Take 1 tablet (25 mg total) by mouth in the morning AND 0.5 tablets (12.5 mg total) every evening.    Dispense:  135 tablet    Refill:  1    Follow-up: Return in about 4 months (around 09/30/2022) for Annual physical.    Lindell Spar, MD

## 2022-06-03 LAB — CUP PACEART REMOTE DEVICE CHECK
Battery Remaining Longevity: 156 mo
Battery Remaining Percentage: 100 %
Brady Statistic RV Percent Paced: 0 %
Date Time Interrogation Session: 20240129055900
HighPow Impedance: 94 Ohm
Implantable Lead Connection Status: 753985
Implantable Lead Implant Date: 20210422
Implantable Lead Location: 753860
Implantable Lead Model: 138
Implantable Lead Serial Number: 302700
Implantable Pulse Generator Implant Date: 20210422
Lead Channel Impedance Value: 409 Ohm
Lead Channel Setting Pacing Amplitude: 2.5 V
Lead Channel Setting Pacing Pulse Width: 0.4 ms
Lead Channel Setting Sensing Sensitivity: 0.5 mV
Pulse Gen Serial Number: 209495
Zone Setting Status: 755011

## 2022-06-12 ENCOUNTER — Telehealth: Payer: Self-pay | Admitting: Internal Medicine

## 2022-06-12 NOTE — Telephone Encounter (Signed)
Patient came by the office and needs refill  Oxycodone HCl 10 MG TABS [place], [place] [address], [place] [address] Phone: [phone]  Fax: [phone] DEA #: --

## 2022-06-15 NOTE — Progress Notes (Signed)
Remote ICD transmission.   

## 2022-06-18 ENCOUNTER — Other Ambulatory Visit: Payer: Self-pay | Admitting: Internal Medicine

## 2022-06-22 ENCOUNTER — Other Ambulatory Visit: Payer: Self-pay | Admitting: Internal Medicine

## 2022-06-22 ENCOUNTER — Telehealth: Payer: Self-pay | Admitting: Internal Medicine

## 2022-06-22 DIAGNOSIS — M170 Bilateral primary osteoarthritis of knee: Secondary | ICD-10-CM

## 2022-06-22 DIAGNOSIS — G894 Chronic pain syndrome: Secondary | ICD-10-CM

## 2022-06-22 MED ORDER — OXYCODONE HCL 10 MG PO TABS: 10.0000 mg | ORAL_TABLET | Freq: Two times a day (BID) | ORAL | 0 refills | Status: DC | PRN

## 2022-06-22 NOTE — Telephone Encounter (Signed)
Patient needs refill on    Oxycodone HCl 10 MG TABS [place], [place] - [address], [place] [address] Phone: [phone]  Fax: [phone] DEA #: --

## 2022-06-23 DIAGNOSIS — N184 Chronic kidney disease, stage 4 (severe): Secondary | ICD-10-CM | POA: Diagnosis not present

## 2022-06-23 DIAGNOSIS — I5022 Chronic systolic (congestive) heart failure: Secondary | ICD-10-CM | POA: Diagnosis not present

## 2022-06-23 DIAGNOSIS — E210 Primary hyperparathyroidism: Secondary | ICD-10-CM | POA: Diagnosis not present

## 2022-06-23 DIAGNOSIS — I129 Hypertensive chronic kidney disease with stage 1 through stage 4 chronic kidney disease, or unspecified chronic kidney disease: Secondary | ICD-10-CM | POA: Diagnosis not present

## 2022-06-30 ENCOUNTER — Other Ambulatory Visit: Payer: Self-pay | Admitting: Internal Medicine

## 2022-06-30 ENCOUNTER — Encounter: Payer: 59 | Admitting: Internal Medicine

## 2022-06-30 NOTE — Telephone Encounter (Signed)
Patient has appt with provider on 3/12- first available. Will refill 30 days until appt.

## 2022-07-06 DIAGNOSIS — R809 Proteinuria, unspecified: Secondary | ICD-10-CM | POA: Diagnosis not present

## 2022-07-06 DIAGNOSIS — D638 Anemia in other chronic diseases classified elsewhere: Secondary | ICD-10-CM | POA: Diagnosis not present

## 2022-07-06 DIAGNOSIS — E210 Primary hyperparathyroidism: Secondary | ICD-10-CM | POA: Diagnosis not present

## 2022-07-06 DIAGNOSIS — I129 Hypertensive chronic kidney disease with stage 1 through stage 4 chronic kidney disease, or unspecified chronic kidney disease: Secondary | ICD-10-CM | POA: Diagnosis not present

## 2022-07-06 DIAGNOSIS — N184 Chronic kidney disease, stage 4 (severe): Secondary | ICD-10-CM | POA: Diagnosis not present

## 2022-07-06 DIAGNOSIS — I5022 Chronic systolic (congestive) heart failure: Secondary | ICD-10-CM | POA: Diagnosis not present

## 2022-07-09 ENCOUNTER — Telehealth: Payer: Self-pay | Admitting: Physical Medicine and Rehabilitation

## 2022-07-09 NOTE — Telephone Encounter (Signed)
Encounter made in error. 

## 2022-07-10 ENCOUNTER — Telehealth: Payer: Self-pay | Admitting: Physical Medicine and Rehabilitation

## 2022-07-10 NOTE — Telephone Encounter (Signed)
Patient called requesting a call back , states he left several messages yesterday and would like to a call back

## 2022-07-13 ENCOUNTER — Encounter: Payer: 59 | Attending: Physical Medicine and Rehabilitation | Admitting: Physical Medicine and Rehabilitation

## 2022-07-13 DIAGNOSIS — G8929 Other chronic pain: Secondary | ICD-10-CM

## 2022-07-13 DIAGNOSIS — M25561 Pain in right knee: Secondary | ICD-10-CM | POA: Diagnosis not present

## 2022-07-13 DIAGNOSIS — M25562 Pain in left knee: Secondary | ICD-10-CM

## 2022-07-13 MED ORDER — IBUPROFEN 800 MG PO TABS: 800.0000 mg | ORAL_TABLET | Freq: Three times a day (TID) | ORAL | 3 refills | Status: DC | PRN

## 2022-07-13 NOTE — Progress Notes (Signed)
Subjective:     Patient ID: [name], male   DOB: 1956-03-30, 67 y.o.   MRN: [id number]  HPI  An audio/video tele-health visit is felt to be the most appropriate encounter for this patient at this time. This is a follow up tele-visit via phone. The patient is at home. MD is at office. Prior to scheduling this appointment, our staff discussed the limitations of evaluation and management by telemedicine and the availability of in-person appointments. The patient expressed understanding and agreed to proceed.   Mr. Maggi is a 67 year old man who presents for f/u of severe bilateral knee OA.   -oxycodone and ibuprofen help but he cannot receive opioids medication from [place] given prior positive urine toxicology screen -he had a successful knee replacement last year and has plans for replacement of his other knee in June -he asks for a refill of his ibuprofen as he has run out. He says he has been clean of any recreational drugs.   Prior history: -He asks whether we can give him another chance to prescribe oxycodone for him. Discussed unfortunately for him that our clinic rules do not permit prescription of controlled medications when there has been cocaine present in urine sample.  -He is not sleeping well due to his pain. Discussed trying amitriptyline at night for pain and insomnia but I would like him to clear this with his cardiologist first.    -He has bilateral knee pain, 10/10, that is sharp, stabbing, and aching.  -he is ready to try Qutenza today -he is still frustrated that we cannot prescribe any opioids for him- his PCP did prescribe them for a short term -he asks why he can't have a second chance  -He used to walk a lot in his work.   -He has had injections in his bilateral knees with steroid and Monovisc without benefit. He has never tried oral steroids.   -He has tried Ibuprofen and Meloxicam without benefit. Discussed retrying this option with his cardiologist Dr.  Haroldine Laws, but it is contraindicated given his stage 2 CKD  -Fortunately he was recently cleared to get a left knee replacement by Dr. Haroldine Laws at his appointment yesterday- wears a Lifevest and has ICD- has been doing well in this regard.   As per patient he was referred here by Dr. Ninfa Linden as [name] is no longer providing benefit and Dr. Ninfa Linden felt Oxycodone may be warranted and should be managed by pain clinic.   Pain Inventory Average Pain 9 Pain Right Now 9 My pain is constant, sharp, and tingling  In the last 24 hours, has pain interfered with the following? General activity 8 Relation with others 8 Enjoyment of life 8 What TIME of day is your pain at its worst? morning  and night Sleep (in general) Poor  Pain is worse with: walking and sitting Pain improves with: medication Relief from Meds: 10        Family History  Problem Relation Age of Onset   Heart failure Mother    Heart disease Mother    Cancer Mother        breast   Heart attack Father    Hypertension Father    Heart disease Father    Diabetes Brother    Social History   Socioeconomic History   Marital status: Single    Spouse name: Not on file   Number of children: 0   Years of education: Not on file   Highest education level: Some college,  no degree  Occupational History   Occupation: retired  Tobacco Use   Smoking status: Former    Packs/day: 0.50    Years: 40.00    Total pack years: 20.00    Types: Cigarettes    Quit date: 03/12/2019    Years since quitting: 3.3   Smokeless tobacco: Never  Vaping Use   Vaping Use: Never used  Substance and Sexual Activity   Alcohol use: No   Drug use: No   Sexual activity: Not Currently  Other Topics Concern   Not on file  Social History Narrative   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       Enjoys: fishing, shopping, working around the house       Diet: working on changes, avoiding fried foods, increased veggies   Caffeine: teas some  daily   Water: 6-8 cups daily       Wears seat belt    Does not use phone while driving    Oceanographer at home    Social Determinants of Health   Financial Resource Strain: Low Risk  (01/19/2022)   Overall Financial Resource Strain (CARDIA)    Difficulty of Paying Living Expenses: Not hard at all  Food Insecurity: No Food Insecurity (04/03/2022)   Hunger Vital Sign    Worried About [place] in the Last Year: Never true    [place] in the Last Year: Never true  Transportation Needs: No Transportation Needs (02/02/2022)   PRAPARE - Hydrologist (Medical): No    Lack of Transportation (Non-Medical): No  Physical Activity: Sufficiently Active (01/19/2022)   Exercise Vital Sign    Days of Exercise per Week: 7 days    Minutes of Exercise per Session: 30 min  Stress: No Stress Concern Present (01/19/2022)   [place]    Feeling of Stress : Not at all  Social Connections: Moderately Isolated (01/19/2022)   Social Connection and Isolation Panel [NHANES]    Frequency of Communication with Friends and Family: More than three times a week    Frequency of Social Gatherings with Friends and Family: More than three times a week    Attends Religious Services: More than 4 times per year    Active Member of Clubs or Organizations: No    Attends Archivist Meetings: Never    Marital Status: Never married   Past Surgical History:  Procedure Laterality Date   AMPUTATION TOE Left 1991   2 digit of left foot   BIOPSY  02/26/2021   Procedure: BIOPSY;  Surgeon: Rogene Houston, MD;  [facility];  Service: Endoscopy;;   CHOLECYSTECTOMY     COLONOSCOPY     COLONOSCOPY WITH PROPOFOL N/A 02/26/2021   Procedure: COLONOSCOPY WITH PROPOFOL;  Surgeon: Rogene Houston, MD;  [facility];  Service: Endoscopy;  Laterality: N/A;   CORONARY/GRAFT ACUTE MI  REVASCULARIZATION N/A 03/12/2019   Procedure: Coronary/Graft Acute MI Revascularization;  Surgeon: Belva Crome, MD;  Location: [place] [facility];  Service: Cardiovascular;  Laterality: N/A;   ESOPHAGOGASTRODUODENOSCOPY (EGD) WITH PROPOFOL N/A 02/26/2021   Procedure: ESOPHAGOGASTRODUODENOSCOPY (EGD) WITH PROPOFOL;  Surgeon: Rogene Houston, MD;  [facility];  Service: Endoscopy;  Laterality: N/A;   ICD IMPLANT N/A 08/24/2019   Procedure: ICD IMPLANT;  Surgeon: Evans Lance, MD;  Location: [place] [facility];  Service: Cardiovascular;  Laterality: N/A;   IR URETERAL STENT LEFT NEW ACCESS W/O SEP NEPHROSTOMY CATH  09/26/2019   LEFT HEART CATH AND CORONARY ANGIOGRAPHY N/A 03/12/2019   Procedure: LEFT HEART CATH AND CORONARY ANGIOGRAPHY;  Surgeon: Belva Crome, MD;  Location: [place] [facility];  Service: Cardiovascular;  Laterality: N/A;   LITHOTRIPSY Right    NEPHROLITHOTOMY Left 09/26/2019   Procedure: LEFT NEPHROLITHOTOMY PERCUTANEOUS;  Surgeon: Irine Seal, MD;  [facility];  Service: Urology;  Laterality: Left;   TOTAL KNEE ARTHROPLASTY Left 02/02/2022   Procedure: TOTAL KNEE ARTHROPLASTY;  Surgeon: Vickey Huger, MD;  [facility];  Service: Orthopedics;  Laterality: Left;   Past Medical History:  Diagnosis Date   Acute ST elevation myocardial infarction (STEMI) due to occlusion of mid portion of left anterior descending (LAD) coronary artery ([place]) 0000000   Acute systolic heart failure (HCC)    AICD (automatic cardioverter/defibrillator) present    Arthritis    CHF (congestive heart failure) (HCC)    CKD (chronic kidney disease) stage 4, GFR 15-29 ml/min (HCC)    Diabetes mellitus, type II ([place])    Dyspnea    with exertionm periodically   Heart attack ([place]) 03/15/2019   anterior MI    History of kidney stones    Hyperparathyroidism ([place])    Hypertension 2002   Ischemic cardiomyopathy    Left knee pain    Syncope 04/04/2019   after blood draw   Vitamin  D deficiency    There were no vitals taken for this visit.  Opioid Risk Score:   Fall Risk Score:  `1  Depression screen [facility] 2/9     06/01/2022    9:34 AM 03/31/2022    1:36 PM 01/19/2022    3:19 PM 01/19/2022    3:18 PM 09/17/2021   11:16 AM 07/21/2021    9:43 AM 06/10/2021    2:17 PM  Depression screen [facility] 2/9  Decreased Interest 0 1 0 0 0 0 0  Down, Depressed, Hopeless 0 0 0 0 0 0 0  PHQ - 2 Score 0 1 0 0 0 0 0    Review of Systems  Constitutional: Negative.   HENT: Negative.    Eyes: Negative.   Respiratory: Negative.    Cardiovascular: Negative.   Gastrointestinal: Negative.   Endocrine: Negative.   Genitourinary: Negative.   Musculoskeletal:  Positive for arthralgias, back pain and gait problem.       Pain in both knees  Skin: Negative.   Allergic/Immunologic: Negative.   Hematological: Negative.   Psychiatric/Behavioral: Negative.    All other systems reviewed and are negative.      Objective:   Physical Exam Not performed    Assessment:         Plan:     Mr. Oman Mosey is 36 man who presents for follow-up of bilateral knee OA.  1) Chronic Pain Syndrome secondary to bilateral knee OA -Discussed current symptoms of pain and history of pain.  -ibuprofen refilled -advised to take ibuprofen with food to limit GI side effects -discussed that if he were to feel GI side effects to call me and I could prescribe PPI for him -confirmed with patient that neprhology has cleared him to use ibuprofen -Provided with a pain relief journal and discussed that it contains foods and lifestyle tips to naturally help to improve pain. Discussed that these lifestyle strategies are also very good for health unlike some medications which can have negative side effects. Discussed that the  act of keeping a journal can be therapeutic and helpful to realize patterns what helps to trigger and alleviate pain.   -Discussed Qutenza as an option for neuropathic pain control.  Discussed that this is a capsaicin patch, stronger than capsaicin cream. Discussed that it is currently approved for diabetic peripheral neuropathy and post-herpetic neuralgia, but that it has also shown benefit in treating other forms of neuropathy. Provided patient with link to site to learn more about the patch: CinemaBonus.fr. Discussed that the patch would be placed in office and benefits usually last 3 months. Discussed that unintended exposure to capsaicin can cause severe irritation of eyes, mucous membranes, respiratory tract, and skin, but that Qutenza is a local treatment and does not have the systemic side effects of other nerve medications. Discussed that there may be pain, itching, erythema, and decreased sensory function associated with the application of Qutenza. Side effects usually subside within 1 week. A cold pack of analgesic medications can help with these side effects. Blood pressure can also be increased due to pain associated with administration of the patch.  1 patch of Qutenza was applied to the area of pain. Ice packs were applied during the procedure to ensure patient comfort. Blood pressure was monitored every 15 minutes. The patient tolerated the procedure well. Post-procedure instructions were given and follow-up has been scheduled.   Recommended [employer] -referred to [facility] for second opinion -Discussed benefits of exercise in reducing pain. -Discussed following foods that may reduce pain: 1) Ginger (especially studied for arthritis)- reduce leukotriene production to decrease inflammation 2) Blueberries- high in phytonutrients that decrease inflammation 3) Salmon- marine omega-3s reduce joint swelling and pain 4) Pumpkin seeds- reduce inflammation 5) dark chocolate- reduces inflammation 6) turmeric- reduces inflammation 7) tart cherries - reduce pain and stiffness 8) extra virgin olive oil - its compound olecanthal helps to block  prostaglandins  9) chili peppers- can be eaten or applied topically via capsaicin 10) mint- helpful for headache, muscle aches, joint pain, and itching 11) garlic- reduces inflammation  Link to further information on diet for chronic pain: http://www.randall.com/    -XRs reviewed and show moderate tricompartmental arthritis.  -UDS obtained previously and is positive for cocaine. He denies cocaine use, but we cannot prescribe controlled substances in this case. Discussed with him and offered referral to [facility] and he is agreeable. He would like to continue following here as well.  -Discussed that opioids have significant side effects of tolerance and dependence as well and are not intended for long-term treatment.  -Plan for steroid injection next visit. If he is able to get a surgical date in the near future, will cancel steroid injection.  Turmeric to reduce inflammation--can be used in cooking or taken as a supplement.  Benefits of turmeric:  -Highly anti-inflammatory  -Increases antioxidants  -Improves memory, attention, brain disease  -Lowers risk of heart disease  -May help prevent cancer  -Decreases pain  -Alleviates depression  -Delays aging and decreases risk of chronic disease  -Consume with black pepper to increase absorption    Turmeric Milk Recipe:  1 cup milk  1 tsp turmeric  1 tsp cinnamon  1 tsp grated ginger (optional)  Black pepper (boosts the anti-inflammatory properties of turmeric).  1 tsp honey  2) Impaired mobility and ADLs -Provided with handicap placard given antalgic gait and limited mobility  -Provided with script for a single-point cane.   3) stage 4 CKD: -discussed with Dr. Haroldine Laws, advised patient that Meloxicam is  contraindicated given stage 4 CKD  4) insomnia -recommended clearing amitriptyline with his cardiologist.   5 minutes spent in  discussion of pain with patient, refilling ibuprofen, confirming that nephrology has cleared him to take this, discussion of how to use medication

## 2022-07-14 ENCOUNTER — Encounter: Payer: 59 | Admitting: Internal Medicine

## 2022-07-16 ENCOUNTER — Telehealth: Payer: Self-pay | Admitting: Internal Medicine

## 2022-07-16 ENCOUNTER — Ambulatory Visit: Payer: Medicare Other | Admitting: Urology

## 2022-07-16 ENCOUNTER — Other Ambulatory Visit: Payer: Self-pay | Admitting: Internal Medicine

## 2022-07-16 DIAGNOSIS — G894 Chronic pain syndrome: Secondary | ICD-10-CM

## 2022-07-16 DIAGNOSIS — M170 Bilateral primary osteoarthritis of knee: Secondary | ICD-10-CM

## 2022-07-16 MED ORDER — OXYCODONE HCL 10 MG PO TABS: 10.0000 mg | ORAL_TABLET | Freq: Two times a day (BID) | ORAL | 0 refills | Status: DC | PRN

## 2022-07-16 NOTE — Telephone Encounter (Signed)
Pt requesting refill on   Oxycodone HCl 10 MG TABS   [facility] - [place], [place] - [address], [place] [address] Phone: [phone]  Fax: [phone] DEA #: -- DAW Reason: --

## 2022-08-05 ENCOUNTER — Other Ambulatory Visit: Payer: Self-pay | Admitting: Internal Medicine

## 2022-08-05 DIAGNOSIS — M170 Bilateral primary osteoarthritis of knee: Secondary | ICD-10-CM

## 2022-08-05 DIAGNOSIS — G894 Chronic pain syndrome: Secondary | ICD-10-CM

## 2022-08-10 ENCOUNTER — Other Ambulatory Visit: Payer: Self-pay | Admitting: Internal Medicine

## 2022-08-10 ENCOUNTER — Telehealth: Payer: Self-pay | Admitting: Internal Medicine

## 2022-08-10 DIAGNOSIS — M170 Bilateral primary osteoarthritis of knee: Secondary | ICD-10-CM

## 2022-08-10 DIAGNOSIS — G894 Chronic pain syndrome: Secondary | ICD-10-CM

## 2022-08-10 MED ORDER — OXYCODONE HCL 10 MG PO TABS: 10.0000 mg | ORAL_TABLET | Freq: Two times a day (BID) | ORAL | 0 refills | Status: DC | PRN

## 2022-08-10 NOTE — Telephone Encounter (Signed)
Left voice mail

## 2022-08-10 NOTE — Telephone Encounter (Signed)
Prescription Request  08/10/2022  LOV: 06/01/2022  What is the name of the medication or equipment? Oxycodone HCl 10 MG TABS   Have you contacted your pharmacy to request a refill? No   Which pharmacy would you like this sent to?   [facility] - [place], [place] - [address] [place] [address] Phone: [phone] Fax: [phone]    Patient notified that their request is being sent to the clinical staff for review and that they should receive a response within 2 business days.   Please advise at [facility] [phone]

## 2022-08-11 ENCOUNTER — Encounter: Payer: Self-pay | Admitting: Internal Medicine

## 2022-08-11 ENCOUNTER — Ambulatory Visit: Payer: Medicare HMO | Attending: Internal Medicine | Admitting: Internal Medicine

## 2022-08-11 VITALS — BP 160/98 | HR 62 | Ht 72.0 in | Wt 222.0 lb

## 2022-08-11 DIAGNOSIS — I2510 Atherosclerotic heart disease of native coronary artery without angina pectoris: Secondary | ICD-10-CM | POA: Diagnosis not present

## 2022-08-11 DIAGNOSIS — I5022 Chronic systolic (congestive) heart failure: Secondary | ICD-10-CM

## 2022-08-11 LAB — CUP PACEART INCLINIC DEVICE CHECK
Brady Statistic RV Percent Paced: 1 % — CL
Date Time Interrogation Session: 20240409121429
HighPow Impedance: 48 Ohm
HighPow Impedance: 65 Ohm
Implantable Lead Connection Status: 753985
Implantable Lead Implant Date: 20210422
Implantable Lead Location: 753860
Implantable Lead Model: 138
Implantable Lead Serial Number: 302700
Implantable Pulse Generator Implant Date: 20210422
Lead Channel Impedance Value: 438 Ohm
Lead Channel Pacing Threshold Amplitude: 1 V
Lead Channel Pacing Threshold Pulse Width: 0.4 ms
Lead Channel Sensing Intrinsic Amplitude: 21.3 mV
Lead Channel Setting Pacing Amplitude: 2.5 V
Lead Channel Setting Pacing Pulse Width: 0.4 ms
Lead Channel Setting Sensing Sensitivity: 0.5 mV
Pulse Gen Serial Number: 209495
Zone Setting Status: 755011

## 2022-08-11 MED ORDER — CARVEDILOL 25 MG PO TABS
25.0000 mg | ORAL_TABLET | Freq: Two times a day (BID) | ORAL | 3 refills | Status: DC
Start: 2022-08-11 — End: 2023-03-03

## 2022-08-11 NOTE — Progress Notes (Signed)
HPI [name] returns for ongoing ICD followup. He is a pleasant 67 yo man with chronic systolic heart failure, an ICM and renal insufficiency. He is s/p PCI. He underwent ICD Insertion a couple of years ago. He has done well in the interim. No residual chest pain. He has class 2 dyspnea. His bp is not well controlled. No Known Allergies   Current Outpatient Medications  Medication Sig Dispense Refill   allopurinol (ZYLOPRIM) 100 MG tablet Take 100 mg by mouth daily.      aspirin 81 MG chewable tablet Chew 1 tablet (81 mg total) by mouth 2 (two) times daily. 60 tablet 0   atorvastatin (LIPITOR) 80 MG tablet TAKE 1 TABLET BY MOUTH DAILY AT 6PM 90 tablet 0   BRILINTA 60 MG TABS tablet TAKE (1) TABLET BY MOUTH TWICE DAILY. 180 tablet 3   carvedilol (COREG) 25 MG tablet Take 1 tablet (25 mg total) by mouth 2 (two) times daily with a meal. 180 tablet 3   Cholecalciferol (VITAMIN D) 50 MCG (2000 UT) CAPS Take 2,000 Units by mouth daily with breakfast.     cinacalcet (SENSIPAR) 30 MG tablet Take 30 mg by mouth daily.     CORLANOR 5 MG TABS tablet TAKE 1 TABLET BY MOUTH TWICE DAILY WITH A MEAL 60 tablet 0   diclofenac Sodium (VOLTAREN) 1 % GEL Apply 2 g topically 4 (four) times daily. 50 g 0   furosemide (LASIX) 40 MG tablet Take 40 mg by mouth daily as needed for fluid.     ibuprofen (ADVIL) 800 MG tablet Take 1 tablet (800 mg total) by mouth every 8 (eight) hours as needed. 90 tablet 3   lidocaine (XYLOCAINE) 2 % solution Use as directed 10 mLs in the mouth or throat every 3 (three) hours as needed for mouth pain. 100 mL 0   methocarbamol (ROBAXIN) 500 MG tablet Take 1-2 tablets (500-1,000 mg total) by mouth every 6 (six) hours as needed for muscle spasms. 60 tablet 0   nitroGLYCERIN (NITROSTAT) 0.4 MG SL tablet Place 1 tablet (0.4 mg total) under the tongue every 5 (five) minutes as needed. 25 tablet 2   [START ON 08/13/2022] Oxycodone HCl 10 MG TABS Take 1 tablet (10 mg total) by mouth  2 (two) times daily as needed (Severe pain). 60 tablet 0   pantoprazole (PROTONIX) 40 MG tablet Take 1 tablet (40 mg total) by mouth 2 (two) times daily before a meal. 60 tablet 11   sodium bicarbonate 650 MG tablet Take 650 mg by mouth 2 (two) times daily.     telmisartan (MICARDIS) 20 MG tablet Take 10 mg by mouth daily. (Patient not taking: Reported on 08/11/2022)     No current facility-administered medications for this visit.     Past Medical History:  Diagnosis Date   Acute ST elevation myocardial infarction (STEMI) due to occlusion of mid portion of left anterior descending (LAD) coronary artery 03/12/2019   Acute systolic heart failure    AICD (automatic cardioverter/defibrillator) present    Arthritis    CHF (congestive heart failure)    CKD (chronic kidney disease) stage 4, GFR 15-29 ml/min    Diabetes mellitus, type II    Dyspnea    with exertionm periodically   Heart attack 03/15/2019   anterior MI    History of kidney stones    Hyperparathyroidism    Hypertension 2002   Ischemic cardiomyopathy    Left knee pain  Syncope 04/04/2019   after blood draw   Vitamin D deficiency     ROS:   All systems reviewed and negative except as noted in the HPI.   Past Surgical History:  Procedure Laterality Date   AMPUTATION TOE Left 1991   2 digit of left foot   BIOPSY  02/26/2021   Procedure: BIOPSY;  Surgeon: Malissa Hippo, MD;  [facility];  Service: Endoscopy;;   CHOLECYSTECTOMY     COLONOSCOPY     COLONOSCOPY WITH PROPOFOL N/A 02/26/2021   Procedure: COLONOSCOPY WITH PROPOFOL;  Surgeon: Malissa Hippo, MD;  [facility];  Service: Endoscopy;  Laterality: N/A;   CORONARY/GRAFT ACUTE MI REVASCULARIZATION N/A 03/12/2019   Procedure: Coronary/Graft Acute MI Revascularization;  Surgeon: Lyn Records, MD;  [facility];  Service: Cardiovascular;  Laterality: N/A;   ESOPHAGOGASTRODUODENOSCOPY (EGD) WITH PROPOFOL N/A 02/26/2021    Procedure: ESOPHAGOGASTRODUODENOSCOPY (EGD) WITH PROPOFOL;  Surgeon: Malissa Hippo, MD;  [facility];  Service: Endoscopy;  Laterality: N/A;   ICD IMPLANT N/A 08/24/2019   Procedure: ICD IMPLANT;  Surgeon: Marinus Maw, MD;  [facility]: [facility];  Service: Cardiovascular;  Laterality: N/A;   IR URETERAL STENT LEFT NEW ACCESS W/O SEP NEPHROSTOMY CATH  09/26/2019   LEFT HEART CATH AND CORONARY ANGIOGRAPHY N/A 03/12/2019   Procedure: LEFT HEART CATH AND CORONARY ANGIOGRAPHY;  Surgeon: Lyn Records, MD;  [facility];  Service: Cardiovascular;  Laterality: N/A;   LITHOTRIPSY Right    NEPHROLITHOTOMY Left 09/26/2019   Procedure: LEFT NEPHROLITHOTOMY PERCUTANEOUS;  Surgeon: Bjorn Pippin, MD;  [facility];  Service: Urology;  Laterality: Left;   TOTAL KNEE ARTHROPLASTY Left 02/02/2022   Procedure: TOTAL KNEE ARTHROPLASTY;  Surgeon: Dannielle Huh, MD;  [facility];  Service: Orthopedics;  Laterality: Left;     Family History  Problem Relation Age of Onset   Heart failure Mother    Heart disease Mother    Cancer Mother        breast   Heart attack Father    Hypertension Father    Heart disease Father    Diabetes Brother      Social History   Socioeconomic History   Marital status: Single    Spouse name: Not on file   Number of children: 0   Years of education: Not on file   Highest education level: Some college, no degree  Occupational History   Occupation: retired  Tobacco Use   Smoking status: Former    Packs/day: 0.50    Years: 40.00    Additional pack years: 0.00    Total pack years: 20.00    Types: Cigarettes    Quit date: 03/12/2019    Years since quitting: 3.4   Smokeless tobacco: Never  Vaping Use   Vaping Use: Never used  Substance and Sexual Activity   Alcohol use: No   Drug use: No   Sexual activity: Not Currently  Other Topics Concern   Not on file  Social History Narrative   Lives with mother and is her  caregiver       Enjoys: fishing, shopping, working around the house       Diet: working on changes, avoiding fried foods, increased veggies   Caffeine: teas some daily   Water: 6-8 cups daily       Wears seat belt    Does not use phone while driving  Smoke detectors at home    Social Determinants of Health   Financial Resource Strain: Low Risk  (01/19/2022)   Overall Financial Resource Strain (CARDIA)    Difficulty of Paying Living Expenses: Not hard at all  Food Insecurity: No Food Insecurity (04/03/2022)   Hunger Vital Sign    Worried About Running Out of Food in the Last Year: Never true    Ran Out of Food in the Last Year: Never true  Transportation Needs: No Transportation Needs (02/02/2022)   PRAPARE - Administrator, Civil Service (Medical): No    Lack of Transportation (Non-Medical): No  Physical Activity: Sufficiently Active (01/19/2022)   Exercise Vital Sign    Days of Exercise per Week: 7 days    Minutes of Exercise per Session: 30 min  Stress: No Stress Concern Present (01/19/2022)   [employer] of Occupational Health - Occupational Stress Questionnaire    Feeling of Stress : Not at all  Social Connections: Moderately Isolated (01/19/2022)   Social Connection and Isolation Panel [NHANES]    Frequency of Communication with Friends and Family: More than three times a week    Frequency of Social Gatherings with Friends and Family: More than three times a week    Attends Religious Services: More than 4 times per year    Active Member of [employer] or Organizations: No    Attends Banker Meetings: Never    Marital Status: Never married  Intimate Partner Violence: Not At Risk (02/02/2022)   Humiliation, Afraid, Rape, and Kick questionnaire    Fear of Current or Ex-Partner: No    Emotionally Abused: No    Physically Abused: No    Sexually Abused: No     BP (!) 160/98   Pulse 62   Ht 6' (1.829 m)   Wt 222 lb (100.7 kg)   SpO2 95%   BMI  30.11 kg/m   Physical Exam:  Well appearing NAD HEENT: Unremarkable Neck:  No JVD, no thyromegally Lymphatics:  No adenopathy Back:  No CVA tenderness Lungs:  Clear HEART:  Regular rate rhythm, no murmurs, no rubs, no clicks Abd:  soft, positive bowel sounds, no organomegally, no rebound, no guarding Ext:  2 plus pulses, no edema, no cyanosis, no clubbing Skin:  No rashes no nodules Neuro:  CN II through XII intact, motor grossly intact  EKG - nsr  DEVICE  Normal device function.  See PaceArt for details.   Assess/Plan: 1. Chronic systolic heart failure - he has class 2 symptoms and we will continue his current meds. He remains with class 2 symptoms. Continue maximal medical therapy 2. CAD, s/p anterior MI - he denies anginal symptoms. No change in meds. 3. HTN - his bp is not well controlled. I have asked him to increase his dose of coreg to 25 mg bid. 4. ICD - his [place] [employer] single chamber ICD is working normally. He fluid status is stable with review of heart logic. He will continue his current treatment.   Sharlot Gowda Talen Poser,MD

## 2022-08-11 NOTE — Patient Instructions (Signed)
Medication Instructions:  Your physician has recommended you make the following change in your medication:   Increase Coreg to 25 mg Two Times Daily   *If you need a refill on your cardiac medications before your next appointment, please call your pharmacy*   Lab Work: NONE   If you have labs (blood work) drawn today and your tests are completely normal, you will receive your results only by: MyChart Message (if you have MyChart) OR A paper copy in the mail If you have any lab test that is abnormal or we need to change your treatment, we will call you to review the results.   Testing/Procedures: NONE    Follow-Up: At [facility], you and your health needs are our priority.  As part of our continuing mission to provide you with exceptional heart care, we have created designated Provider Care Teams.  These Care Teams include your primary Cardiologist (physician) and Advanced Practice Providers (APPs -  Physician Assistants and Nurse Practitioners) who all work together to provide you with the care you need, when you need it.  We recommend signing up for the patient portal called "MyChart".  Sign up information is provided on this After Visit Summary.  MyChart is used to connect with patients for Virtual Visits (Telemedicine).  Patients are able to view lab/test results, encounter notes, upcoming appointments, etc.  Non-urgent messages can be sent to your provider as well.   To learn more about what you can do with MyChart, go to [email or web address].    Your next appointment:   1 year(s)  Provider:   Lewayne Bunting, MD    Other Instructions Thank you for choosing [facility]!

## 2022-08-28 ENCOUNTER — Ambulatory Visit (INDEPENDENT_AMBULATORY_CARE_PROVIDER_SITE_OTHER): Payer: Medicare HMO

## 2022-08-28 DIAGNOSIS — I255 Ischemic cardiomyopathy: Secondary | ICD-10-CM | POA: Diagnosis not present

## 2022-08-28 LAB — CUP PACEART REMOTE DEVICE CHECK
Battery Remaining Longevity: 144 mo
Battery Remaining Percentage: 100 %
Brady Statistic RV Percent Paced: 0 %
Date Time Interrogation Session: 20240426040000
HighPow Impedance: 64 Ohm
Implantable Lead Connection Status: 753985
Implantable Lead Implant Date: 20210422
Implantable Lead Location: 753860
Implantable Lead Model: 138
Implantable Lead Serial Number: 302700
Implantable Pulse Generator Implant Date: 20210422
Lead Channel Impedance Value: 423 Ohm
Lead Channel Setting Pacing Amplitude: 2.5 V
Lead Channel Setting Pacing Pulse Width: 0.4 ms
Lead Channel Setting Sensing Sensitivity: 0.5 mV
Pulse Gen Serial Number: 209495
Zone Setting Status: 755011

## 2022-08-31 ENCOUNTER — Other Ambulatory Visit: Payer: Self-pay | Admitting: Physical Medicine and Rehabilitation

## 2022-08-31 MED ORDER — IBUPROFEN 800 MG PO TABS: 800.0000 mg | ORAL_TABLET | Freq: Three times a day (TID) | ORAL | 3 refills | Status: DC | PRN

## 2022-09-08 ENCOUNTER — Telehealth: Payer: Self-pay | Admitting: Internal Medicine

## 2022-09-08 ENCOUNTER — Other Ambulatory Visit: Payer: Self-pay | Admitting: Internal Medicine

## 2022-09-08 DIAGNOSIS — G894 Chronic pain syndrome: Secondary | ICD-10-CM

## 2022-09-08 DIAGNOSIS — M170 Bilateral primary osteoarthritis of knee: Secondary | ICD-10-CM

## 2022-09-08 MED ORDER — OXYCODONE HCL 10 MG PO TABS
10.0000 mg | ORAL_TABLET | Freq: Two times a day (BID) | ORAL | 0 refills | Status: DC | PRN
Start: 2022-09-16 — End: 2022-10-19

## 2022-09-08 NOTE — Telephone Encounter (Signed)
Unable to reach patient.

## 2022-09-08 NOTE — Telephone Encounter (Signed)
Prescription Request  09/08/2022  LOV: 06/01/2022  What is the name of the medication or equipment? Oxycodone HCl 10 MG TABS   Have you contacted your pharmacy to request a refill? No   Which pharmacy would you like this sent to?  [facility] - [place], [place] - [address] [place] [address] Phone: [phone] Fax: [phone]    Patient notified that their request is being sent to the clinical staff for review and that they should receive a response within 2 business days.   Please advise at Mobile [phone] (mobile)

## 2022-09-09 ENCOUNTER — Telehealth: Payer: Self-pay | Admitting: Internal Medicine

## 2022-09-09 ENCOUNTER — Telehealth: Payer: Self-pay

## 2022-09-09 ENCOUNTER — Other Ambulatory Visit (HOSPITAL_COMMUNITY): Payer: Self-pay

## 2022-09-09 NOTE — Telephone Encounter (Signed)
Pt c/o medication issue:  1. Name of Medication:   ivabradine (CORLANOR) 5 MG TABS tablet    2. How are you currently taking this medication (dosage and times per day)?   3. Are you having a reaction (difficulty breathing--STAT)?   4. What is your medication issue? Patient is calling needing prior auth for this medication since they had insurance change.   Only has two days left

## 2022-09-09 NOTE — Telephone Encounter (Signed)
Pending, please see separate encounter with updates on determination 

## 2022-09-09 NOTE — Telephone Encounter (Signed)
Pt called in for an update again, informed her it was approved a few minutes ago and will be sent over to the pharmacy as soon as they can.

## 2022-09-09 NOTE — Telephone Encounter (Signed)
Pharmacy Patient Advocate Encounter   Received notification from [facility] that prior authorization for [facility] is needed.    PA submitted on 09/09/22 Key [id number] Status is pending  Haze Rushing, CPhT Pharmacy Patient Advocate Specialist Direct Number: [phone] Fax: [phone]

## 2022-09-09 NOTE — Telephone Encounter (Signed)
Pt c/o medication issue:  1. Name of Medication: ivabradine (CORLANOR) 5 MG TABS tablet   2. How are you currently taking this medication (dosage and times per day)? TAKE 1 TABLET BY MOUTH TWICE DAILY WITH A MEAL   3. Are you having a reaction (difficulty breathing--STAT)? No  4. What is your medication issue? Pt states that pharmacy is requesting Prior Auth for medication before she is able to pickup. Pt says that she is out of the medication and would like a callback once this is done so that she is able to go and get. Please advise

## 2022-09-09 NOTE — Telephone Encounter (Signed)
Prior auth approved, pharmacy should be able to fill rx now.

## 2022-09-09 NOTE — Telephone Encounter (Signed)
Patient notified and verbalized understanding. 

## 2022-09-09 NOTE — Telephone Encounter (Signed)
Called to notify pt that prior Berkley Harvey has been sent to the department that is completing them. Pt will call pharmacy later today.

## 2022-09-09 NOTE — Telephone Encounter (Signed)
Pharmacy Patient Advocate Encounter  Prior Authorization for Retia Passe has been approved.    Effective dates: 05/04/22 through 05/04/23  Haze Rushing, CPhT Pharmacy Patient Advocate Specialist Direct Number: [phone] Fax: [phone]

## 2022-09-22 NOTE — Progress Notes (Signed)
Remote ICD transmission.   

## 2022-10-01 ENCOUNTER — Other Ambulatory Visit: Payer: Self-pay | Admitting: Internal Medicine

## 2022-10-01 ENCOUNTER — Other Ambulatory Visit: Payer: Self-pay | Admitting: Physical Medicine and Rehabilitation

## 2022-10-01 MED ORDER — IBUPROFEN 600 MG PO TABS: 600.0000 mg | ORAL_TABLET | Freq: Four times a day (QID) | ORAL | 0 refills | Status: DC | PRN

## 2022-10-12 ENCOUNTER — Telehealth: Payer: Self-pay | Admitting: Internal Medicine

## 2022-10-12 NOTE — Telephone Encounter (Signed)
Patient advised.

## 2022-10-12 NOTE — Telephone Encounter (Signed)
Prescription Request  10/12/2022  LOV: 06/01/2022  What is the name of the medication or equipment? Oxycodone HCl 10 MG TABS   Have you contacted your pharmacy to request a refill? Yes   Which pharmacy would you like this sent to?  [facility] - [place], [place] - [address] [place] [address] Phone: [phone] Fax: [phone]    Patient notified that their request is being sent to the clinical staff for review and that they should receive a response within 2 business days.   Please advise at [facility] [phone]

## 2022-10-13 NOTE — Telephone Encounter (Signed)
Patient called in to see if med (previous tele)  can be sent in last appt was rescheduled. Patient wants a call back

## 2022-10-13 NOTE — Telephone Encounter (Signed)
Spoke with patient.

## 2022-10-14 ENCOUNTER — Encounter: Payer: 59 | Admitting: Internal Medicine

## 2022-10-16 ENCOUNTER — Telehealth: Payer: Self-pay | Admitting: Internal Medicine

## 2022-10-16 DIAGNOSIS — M170 Bilateral primary osteoarthritis of knee: Secondary | ICD-10-CM

## 2022-10-16 DIAGNOSIS — G894 Chronic pain syndrome: Secondary | ICD-10-CM

## 2022-10-16 NOTE — Telephone Encounter (Signed)
Prescription Request  10/16/2022  LOV: 06/01/2022  What is the name of the medication or equipment? Oxycodone HCl 10 MG TABS   Have you contacted your pharmacy to request a refill? No   Which pharmacy would you like this sent to?  [facility] - [place], [place] - [address] [place] [address] Phone: [phone] Fax: [phone]    Patient notified that their request is being sent to the clinical staff for review and that they should receive a response within 2 business days.   Please advise at [facility] [phone]

## 2022-10-19 MED ORDER — OXYCODONE HCL 10 MG PO TABS
10.0000 mg | ORAL_TABLET | Freq: Two times a day (BID) | ORAL | 0 refills | Status: DC | PRN
Start: 2022-10-19 — End: 2022-11-16

## 2022-10-19 NOTE — Telephone Encounter (Signed)
Patient called back why has this not been called in yet. Patient is completely out of this medication and was due 06.15.2024.

## 2022-10-19 NOTE — Telephone Encounter (Signed)
Pt needs  refill on  Oxycodone HCl 10 MG TABS  Please see previous tele messages.

## 2022-10-19 NOTE — Telephone Encounter (Signed)
Left message for patient

## 2022-10-26 ENCOUNTER — Encounter: Payer: Self-pay | Admitting: Internal Medicine

## 2022-10-26 ENCOUNTER — Ambulatory Visit (INDEPENDENT_AMBULATORY_CARE_PROVIDER_SITE_OTHER): Payer: Medicare HMO | Admitting: Internal Medicine

## 2022-10-26 VITALS — BP 136/84 | HR 66 | Ht 72.0 in | Wt 217.2 lb

## 2022-10-26 DIAGNOSIS — Z0001 Encounter for general adult medical examination with abnormal findings: Secondary | ICD-10-CM

## 2022-10-26 DIAGNOSIS — N184 Chronic kidney disease, stage 4 (severe): Secondary | ICD-10-CM

## 2022-10-26 DIAGNOSIS — F411 Generalized anxiety disorder: Secondary | ICD-10-CM | POA: Diagnosis not present

## 2022-10-26 DIAGNOSIS — I10 Essential (primary) hypertension: Secondary | ICD-10-CM

## 2022-10-26 DIAGNOSIS — F1190 Opioid use, unspecified, uncomplicated: Secondary | ICD-10-CM

## 2022-10-26 DIAGNOSIS — G894 Chronic pain syndrome: Secondary | ICD-10-CM

## 2022-10-26 MED ORDER — ALPRAZOLAM 0.5 MG PO TABS
0.5000 mg | ORAL_TABLET | Freq: Every evening | ORAL | 2 refills | Status: DC | PRN
Start: 2022-10-26 — End: 2023-01-20

## 2022-10-26 NOTE — Progress Notes (Signed)
Established Patient Office Visit  Subjective:  Patient ID: [name], male    DOB: December 17, 1955  Age: 67 y.o. MRN: [id number]  CC:  Chief Complaint  Patient presents with   Annual Exam    Patient takes care of his mom and it is starting to effect him and gives him anxiety     HPI [name] is a 67 y.o. male with past medical history of CAD s/p CABG, ischemic cardiomyopathy, CKD and OA who presents for annual physical.  He is currently on DAPT and statin for CAD. BP is well-controlled now. He was having dizziness with Coreg 25 mg BID. He was advised to take only half-tablet in PM instead of full tablet, which has resolved his dizziness. Takes medications regularly. Patient denies headache, dizziness, chest pain, dyspnea or palpitations.  CKD and anemia: Followed by Dr. Wolfgang Phoenix.  Denies any dysuria, hematuria or urinary hesitance or resistance. His GFR was 24 in 02/24.  He takes sodium bicarb and Sensipar currently.  Chronic pain syndrome: He has chronic bilateral knee pain and takes oxycodone 10 mg twice daily for it.  He has had left TKA and is planned to get right TKA in 10/24.  He is unable to take NSAIDs due to CKD and CAD.  GAD and insomnia: He is taking care of of his mother and reports that she wakes him up multiple times in the night.  He used to take Xanax for GAD and insomnia in the past.  Denies anhedonia, SI or HI currently.  Past Medical History:  Diagnosis Date   Acute ST elevation myocardial infarction (STEMI) due to occlusion of mid portion of left anterior descending (LAD) coronary artery (HCC) 03/12/2019   Acute systolic heart failure (HCC)    AICD (automatic cardioverter/defibrillator) present    Arthritis    CHF (congestive heart failure) (HCC)    CKD (chronic kidney disease) stage 4, GFR 15-29 ml/min (HCC)    Diabetes mellitus, type II (HCC)    Dyspnea    with exertionm periodically   Heart attack (HCC) 03/15/2019   anterior MI    History of  kidney stones    Hyperparathyroidism (HCC)    Hypertension 2002   Ischemic cardiomyopathy    Left knee pain    Syncope 04/04/2019   after blood draw   Vitamin D deficiency     Past Surgical History:  Procedure Laterality Date   AMPUTATION TOE Left 1991   2 digit of left foot   BIOPSY  02/26/2021   Procedure: BIOPSY;  Surgeon: Malissa Hippo, MD;  Location: [facility];  Service: Endoscopy;;   CHOLECYSTECTOMY     COLONOSCOPY     COLONOSCOPY WITH PROPOFOL N/A 02/26/2021   Procedure: COLONOSCOPY WITH PROPOFOL;  Surgeon: Malissa Hippo, MD;  Location: [facility];  Service: Endoscopy;  Laterality: N/A;   CORONARY/GRAFT ACUTE MI REVASCULARIZATION N/A 03/12/2019   Procedure: Coronary/Graft Acute MI Revascularization;  Surgeon: Lyn Records, MD;  Location: [facility] INVASIVE CV LAB;  Service: Cardiovascular;  Laterality: N/A;   ESOPHAGOGASTRODUODENOSCOPY (EGD) WITH PROPOFOL N/A 02/26/2021   Procedure: ESOPHAGOGASTRODUODENOSCOPY (EGD) WITH PROPOFOL;  Surgeon: Malissa Hippo, MD;  Location: [facility];  Service: Endoscopy;  Laterality: N/A;   ICD IMPLANT N/A 08/24/2019   Procedure: ICD IMPLANT;  Surgeon: Marinus Maw, MD;  Location: [facility] INVASIVE CV LAB;  Service: Cardiovascular;  Laterality: N/A;   IR URETERAL STENT LEFT NEW ACCESS W/O SEP NEPHROSTOMY CATH  09/26/2019   LEFT  HEART CATH AND CORONARY ANGIOGRAPHY N/A 03/12/2019   Procedure: LEFT HEART CATH AND CORONARY ANGIOGRAPHY;  Surgeon: Lyn Records, MD;  Location: [facility] INVASIVE CV LAB;  Service: Cardiovascular;  Laterality: N/A;   LITHOTRIPSY Right    NEPHROLITHOTOMY Left 09/26/2019   Procedure: LEFT NEPHROLITHOTOMY PERCUTANEOUS;  Surgeon: Bjorn Pippin, MD;  Location: [facility] ORS;  Service: Urology;  Laterality: Left;   TOTAL KNEE ARTHROPLASTY Left 02/02/2022   Procedure: TOTAL KNEE ARTHROPLASTY;  Surgeon: Dannielle Huh, MD;  Location: [facility];  Service: Orthopedics;  Laterality: Left;    Family History  Problem Relation Age of Onset    Heart failure Mother    Heart disease Mother    Cancer Mother        breast   Heart attack Father    Hypertension Father    Heart disease Father    Diabetes Brother     Social History   Socioeconomic History   Marital status: Single    Spouse name: Not on file   Number of children: 0   Years of education: Not on file   Highest education level: Some college, no degree  Occupational History   Occupation: retired  Tobacco Use   Smoking status: Former    Packs/day: 0.50    Years: 40.00    Additional pack years: 0.00    Total pack years: 20.00    Types: Cigarettes    Quit date: 03/12/2019    Years since quitting: 3.6   Smokeless tobacco: Never  Vaping Use   Vaping Use: Never used  Substance and Sexual Activity   Alcohol use: No   Drug use: No   Sexual activity: Not Currently  Other Topics Concern   Not on file  Social History Narrative   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       Enjoys: fishing, shopping, working around the house       Diet: working on changes, avoiding fried foods, increased veggies   Caffeine: teas some daily   Water: 6-8 cups daily       Wears seat belt    Does not use phone while driving    Psychologist, sport and 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    Social Determinants of Health   Financial Resource Strain: Low Risk  (01/19/2022)   Overall Financial Resource Strain (CARDIA)    Difficulty of Paying Living Expenses: Not hard at all  Food Insecurity: No Food Insecurity (04/03/2022)   Hunger Vital Sign    Worried About Running Out of Food in the Last Year: Never true    Ran Out of Food in the Last Year: Never true  Transportation Needs: No Transportation Needs (02/02/2022)   PRAPARE - Administrator, Civil Service (Medical): No    Lack of Transportation (Non-Medical): No  Physical Activity: Sufficiently Active (01/19/2022)   Exercise Vital Sign    Days of Exercise per Week: 7 days    Minutes of Exercise per Session: 30 min  Stress: No Stress Concern Present  (01/19/2022)   [employer] of Occupational Health - Occupational Stress Questionnaire    Feeling of Stress : Not at all  Social Connections: Moderately Isolated (01/19/2022)   Social Connection and Isolation Panel [NHANES]    Frequency of Communication with Friends and Family: More than three times a week    Frequency of Social Gatherings with Friends and Family: More than three times a week    Attends Religious Services: More than 4 times per year  Active Member of Clubs or Organizations: No    Attends Banker Meetings: Never    Marital Status: Never married  Intimate Partner Violence: Not At Risk (02/02/2022)   Humiliation, Afraid, Rape, and Kick questionnaire    Fear of Current or Ex-Partner: No    Emotionally Abused: No    Physically Abused: No    Sexually Abused: No    Outpatient Medications Prior to Visit  Medication Sig Dispense Refill   allopurinol (ZYLOPRIM) 100 MG tablet Take 100 mg by mouth daily.      aspirin 81 MG chewable tablet Chew 1 tablet (81 mg total) by mouth 2 (two) times daily. 60 tablet 0   atorvastatin (LIPITOR) 80 MG tablet TAKE 1 TABLET BY MOUTH DAILY AT 6PM 90 tablet 0   BRILINTA 60 MG TABS tablet TAKE (1) TABLET BY MOUTH TWICE DAILY. 180 tablet 3   carvedilol (COREG) 25 MG tablet Take 1 tablet (25 mg total) by mouth 2 (two) times daily with a meal. 180 tablet 3   Cholecalciferol (VITAMIN D) 50 MCG (2000 UT) CAPS Take 2,000 Units by mouth daily with breakfast.     cinacalcet (SENSIPAR) 30 MG tablet Take 30 mg by mouth daily.     diclofenac Sodium (VOLTAREN) 1 % GEL Apply 2 g topically 4 (four) times daily. 50 g 0   furosemide (LASIX) 40 MG tablet Take 40 mg by mouth daily as needed for fluid.     ibuprofen (ADVIL) 600 MG tablet Take 1 tablet (600 mg total) by mouth every 6 (six) hours as needed. 180 tablet 0   ivabradine (CORLANOR) 5 MG TABS tablet TAKE 1 TABLET BY MOUTH TWICE DAILY WITH A MEAL 180 tablet 1   lidocaine (XYLOCAINE) 2 %  solution Use as directed 10 mLs in the mouth or throat every 3 (three) hours as needed for mouth pain. 100 mL 0   methocarbamol (ROBAXIN) 500 MG tablet Take 1-2 tablets (500-1,000 mg total) by mouth every 6 (six) hours as needed for muscle spasms. 60 tablet 0   nitroGLYCERIN (NITROSTAT) 0.4 MG SL tablet Place 1 tablet (0.4 mg total) under the tongue every 5 (five) minutes as needed. 25 tablet 2   Oxycodone HCl 10 MG TABS Take 1 tablet (10 mg total) by mouth 2 (two) times daily as needed (Severe pain). 60 tablet 0   pantoprazole (PROTONIX) 40 MG tablet TAKE (1) TABLET BY MOUTH TWICE DAILY BEFORE A MEAL. 60 tablet 0   sodium bicarbonate 650 MG tablet Take 650 mg by mouth 2 (two) times daily.     telmisartan (MICARDIS) 20 MG tablet Take 10 mg by mouth daily. (Patient not taking: Reported on 08/11/2022)     No facility-administered medications prior to visit.    No Known Allergies  ROS Review of Systems  Constitutional:  Negative for chills and fever.  HENT:  Negative for ear discharge and sore throat.   Eyes:  Negative for pain and discharge.  Respiratory:  Negative for cough and shortness of breath.   Cardiovascular:  Negative for chest pain and palpitations.  Gastrointestinal:  Negative for constipation, diarrhea, nausea and vomiting.  Endocrine: Negative for polydipsia and polyuria.  Genitourinary:  Negative for dysuria and hematuria.  Musculoskeletal:  Positive for arthralgias and back pain. Negative for neck pain and neck stiffness.  Skin:  Negative for rash.  Neurological:  Negative for dizziness, weakness and numbness.  Psychiatric/Behavioral:  Positive for sleep disturbance. Negative for agitation and behavioral problems. The patient  is nervous/anxious.       Objective:    Physical Exam Vitals reviewed.  Constitutional:      General: He is not in acute distress.    Appearance: He is not diaphoretic.  HENT:     Head: Normocephalic and atraumatic.     Right Ear: There is no  impacted cerumen.     Nose: Nose normal.     Mouth/Throat:     Mouth: Mucous membranes are moist.  Eyes:     General: No scleral icterus.    Extraocular Movements: Extraocular movements intact.  Cardiovascular:     Rate and Rhythm: Normal rate and regular rhythm.     Pulses: Normal pulses.     Heart sounds: Normal heart sounds. No murmur heard. Pulmonary:     Breath sounds: Normal breath sounds. No wheezing or rales.  Abdominal:     Palpations: Abdomen is soft.     Tenderness: There is no abdominal tenderness.  Musculoskeletal:        General: Tenderness (B/l knee, with mild swelling) present.     Cervical back: Neck supple. No tenderness.     Right lower leg: No edema.     Left lower leg: No edema.  Skin:    General: Skin is warm.     Findings: No rash.  Neurological:     General: No focal deficit present.     Mental Status: He is alert and oriented to person, place, and time.     Cranial Nerves: No cranial nerve deficit.     Sensory: No sensory deficit.     Motor: No weakness.  Psychiatric:        Mood and Affect: Mood normal.        Behavior: Behavior normal.     BP 136/84 (BP Location: Right Arm)   Pulse 66   Ht 6' (1.829 m)   Wt 217 lb 3.2 oz (98.5 kg)   SpO2 95%   BMI 29.46 kg/m  Wt Readings from Last 3 Encounters:  10/26/22 217 lb 3.2 oz (98.5 kg)  08/11/22 222 lb (100.7 kg)  06/01/22 213 lb (96.6 kg)    Lab Results  Component Value Date   TSH 0.993 08/28/2020   Lab Results  Component Value Date   WBC 4.7 02/02/2022   HGB 11.6 (L) 02/02/2022   HCT 35.1 (L) 02/02/2022   MCV 95.1 02/02/2022   PLT 163 02/02/2022   Lab Results  Component Value Date   NA 140 02/02/2022   K 4.3 02/02/2022   CO2 22 02/02/2022   GLUCOSE 105 (H) 02/02/2022   BUN 29 (H) 02/02/2022   CREATININE 2.86 (H) 02/02/2022   BILITOT 1.4 (H) 02/02/2022   ALKPHOS 95 02/02/2022   AST 19 02/02/2022   ALT 15 02/02/2022   PROT 7.0 02/02/2022   ALBUMIN 3.3 (L) 02/02/2022    CALCIUM 10.5 (H) 02/02/2022   ANIONGAP 5 02/02/2022   EGFR 26 (L) 08/28/2020   Lab Results  Component Value Date   CHOL 126 08/28/2020   Lab Results  Component Value Date   HDL 52 08/28/2020   Lab Results  Component Value Date   LDLCALC 53 08/28/2020   Lab Results  Component Value Date   TRIG 118 08/28/2020   Lab Results  Component Value Date   CHOLHDL 2.4 08/28/2020   Lab Results  Component Value Date   HGBA1C 5.8 (H) 01/23/2022      Assessment & Plan:   Problem List Items  Addressed This Visit       Cardiovascular and Mediastinum   Essential hypertension    BP Readings from Last 1 Encounters:  10/26/22 136/84  Well-controlled, followed by Nephrology Adjusted dose of Coreg to 25 mg QAM and 12.5 mg QPM now due to dizziness Counseled for compliance with the medications Advised DASH diet and ambulation as tolerated        Genitourinary   Chronic kidney disease, stage IV (severe) (HCC)    Progressive CKD stage 4 Follows up with Nephrology On Sensipar and sodium bicarb tablets Avoid nephrotoxic agents, including NSAIDs        Other   Encounter for general adult medical examination with abnormal findings - Primary    Physical exam as documented. Counseling done  re healthy lifestyle involving commitment to 150 minutes exercise per week, heart healthy diet, and attaining healthy weight.The importance of adequate sleep also discussed. Changes in health habits are decided on by the patient with goals and time frames  set for achieving them. Immunization and cancer screening needs are specifically addressed at this visit.      Chronic pain syndrome    From OA of knee and DDD of lumbar spine On oxycodone 10 mg twice daily as needed for severe pain      GAD (generalized anxiety disorder)    Uncontrolled, likely due to lack of sleep and caregiver fatigue Has responded well to Xanax in the past Started Xanax 0.5 mg nightly as needed Avoid trazodone or Elavil  for now considering his cardiac history      Relevant Medications   ALPRAZolam (XANAX) 0.5 MG tablet    Has taken pneumococcal vaccine at [place], [place]. Has had Eye exam at Dr Dellia Nims office.  Meds ordered this encounter  Medications   ALPRAZolam (XANAX) 0.5 MG tablet    Sig: Take 1 tablet (0.5 mg total) by mouth at bedtime as needed for anxiety.    Dispense:  30 tablet    Refill:  2    Follow-up: Return in about 3 months (around 01/26/2023) for Chronic pain and GAD.    Anabel Halon, MD

## 2022-10-26 NOTE — Assessment & Plan Note (Signed)

## 2022-10-26 NOTE — Addendum Note (Signed)
Addended byTrena Platt on: 10/26/2022 09:46 AM   Modules accepted: Orders

## 2022-10-26 NOTE — Assessment & Plan Note (Addendum)
BP Readings from Last 1 Encounters:  10/26/22 136/84   Well-controlled, followed by Nephrology Adjusted dose of Coreg to 25 mg QAM and 12.5 mg QPM now due to dizziness Counseled for compliance with the medications Advised DASH diet and ambulation as tolerated

## 2022-10-26 NOTE — Assessment & Plan Note (Signed)
Progressive CKD stage 4 Follows up with Nephrology On Sensipar and sodium bicarb tablets Avoid nephrotoxic agents, including NSAIDs 

## 2022-10-26 NOTE — Assessment & Plan Note (Signed)
From OA of knee and DDD of lumbar spine On oxycodone 10 mg twice daily as needed for severe pain

## 2022-10-26 NOTE — Patient Instructions (Signed)
Please continue to take medications as prescribed.  Please continue to follow low carb diet and perform moderate exercise/walking at least 150 mins/week.  Please start taking Xanax as prescribed.

## 2022-10-26 NOTE — Assessment & Plan Note (Signed)
Uncontrolled, likely due to lack of sleep and caregiver fatigue Has responded well to Xanax in the past Started Xanax 0.5 mg nightly as needed Avoid trazodone or Elavil for now considering his cardiac history

## 2022-11-16 ENCOUNTER — Other Ambulatory Visit: Payer: Self-pay | Admitting: Internal Medicine

## 2022-11-16 ENCOUNTER — Telehealth: Payer: Self-pay | Admitting: Internal Medicine

## 2022-11-16 DIAGNOSIS — M170 Bilateral primary osteoarthritis of knee: Secondary | ICD-10-CM

## 2022-11-16 DIAGNOSIS — G894 Chronic pain syndrome: Secondary | ICD-10-CM

## 2022-11-16 MED ORDER — OXYCODONE HCL 10 MG PO TABS
10.0000 mg | ORAL_TABLET | Freq: Two times a day (BID) | ORAL | 0 refills | Status: DC | PRN
Start: 2022-11-16 — End: 2022-12-11

## 2022-11-16 NOTE — Telephone Encounter (Signed)
Prescription Request  11/16/2022  LOV: 10/26/2022  What is the name of the medication or equipment? Oxycodone HCl 10 MG TABS   Have you contacted your pharmacy to request a refill? Yes   Which pharmacy would you like this sent to?  [facility] - [place], [place] - [address] [place] [address] Phone: [phone] Fax: [phone]    Patient notified that their request is being sent to the clinical staff for review and that they should receive a response within 2 business days.   Please advise at [facility] [phone]   Pt is on the way yo pharm  now

## 2022-11-24 ENCOUNTER — Telehealth: Payer: Self-pay | Admitting: Internal Medicine

## 2022-11-24 NOTE — Progress Notes (Deleted)
Referring Provider: Anabel Halon, MD Primary Care Physician:  Anabel Halon, MD Primary GI Physician: Dr. Marletta Lor  No chief complaint on file.   HPI:   [name] is a 67 y.o. male with history of CAD, STEMI, ischemic cardiomyopathy with reduced EF with ICD implant, HTN, CKD, chronic anemia, acute blood loss anemia in 2022 in the setting of PUD, presenting today for ***  EGD October 2022 with prepyloric cratered ulcer with no stigmata or active bleeding. Gastric ulcer felt to be due to chronic NSAID use with meloxicam. Biopsies negative for H. pylori.  Colonoscopy October 2022 WNL besides external hemorrhoids and one single diverticulum.   We have not seen patient since November 2022. He was doing well at that time with no significant GI symptoms and no overt GI bleeding. Had recommended repeat EGD in 8-10 weeks, but this was never completed.   Today:       Labs 06/23/2022 with hemoglobin stable at 11.5.  Normocytic indices.  Ferritin was low at 19, iron 92, iron saturation 33%.  Creatinine 2.8.  Per office visit with nephrology 07/06/2022, patient was given IV iron.  Labs 10/26/2022 with hemoglobin 12.3.   Past Medical History:  Diagnosis Date   Acute ST elevation myocardial infarction (STEMI) due to occlusion of mid portion of left anterior descending (LAD) coronary artery (HCC) 03/12/2019   Acute systolic heart failure (HCC)    AICD (automatic cardioverter/defibrillator) present    Arthritis    CHF (congestive heart failure) (HCC)    CKD (chronic kidney disease) stage 4, GFR 15-29 ml/min (HCC)    Diabetes mellitus, type II (HCC)    Dyspnea    with exertionm periodically   Heart attack (HCC) 03/15/2019   anterior MI    History of kidney stones    Hyperparathyroidism (HCC)    Hypertension 2002   Ischemic cardiomyopathy    Left knee pain    Syncope 04/04/2019   after blood draw   Vitamin D deficiency     Past Surgical History:  Procedure Laterality Date    AMPUTATION TOE Left 1991   2 digit of left foot   BIOPSY  02/26/2021   Procedure: BIOPSY;  Surgeon: Malissa Hippo, MD;  [facility]: [facility];  Service: Endoscopy;;   CHOLECYSTECTOMY     COLONOSCOPY     COLONOSCOPY WITH PROPOFOL N/A 02/26/2021   Procedure: COLONOSCOPY WITH PROPOFOL;  Surgeon: Malissa Hippo, MD;  Location: [facility];  Service: Endoscopy;  Laterality: N/A;   CORONARY/GRAFT ACUTE MI REVASCULARIZATION N/A 03/12/2019   Procedure: Coronary/Graft Acute MI Revascularization;  Surgeon: Lyn Records, MD;  Location: [facility];  Service: Cardiovascular;  Laterality: N/A;   ESOPHAGOGASTRODUODENOSCOPY (EGD) WITH PROPOFOL N/A 02/26/2021   Procedure: ESOPHAGOGASTRODUODENOSCOPY (EGD) WITH PROPOFOL;  Surgeon: Malissa Hippo, MD;  Location: [facility];  Service: Endoscopy;  Laterality: N/A;   ICD IMPLANT N/A 08/24/2019   Procedure: ICD IMPLANT;  Surgeon: Marinus Maw, MD;  Location: [facility] INVASIVE CV LAB;  Service: Cardiovascular;  Laterality: N/A;   IR URETERAL STENT LEFT NEW ACCESS W/O SEP NEPHROSTOMY CATH  09/26/2019   LEFT HEART CATH AND CORONARY ANGIOGRAPHY N/A 03/12/2019   Procedure: LEFT HEART CATH AND CORONARY ANGIOGRAPHY;  Surgeon: Lyn Records, MD;  Location: [facility];  Service: Cardiovascular;  Laterality: N/A;   LITHOTRIPSY Right    NEPHROLITHOTOMY Left 09/26/2019   Procedure: LEFT NEPHROLITHOTOMY PERCUTANEOUS;  Surgeon: Bjorn Pippin, MD;  Location:  [facility];  Service: Urology;  Laterality: Left;   TOTAL KNEE ARTHROPLASTY Left 02/02/2022   Procedure: TOTAL KNEE ARTHROPLASTY;  Surgeon: Dannielle Huh, MD;  Location: [facility];  Service: Orthopedics;  Laterality: Left;    Current Outpatient Medications  Medication Sig Dispense Refill   allopurinol (ZYLOPRIM) 100 MG tablet Take 100 mg by mouth daily.      ALPRAZolam (XANAX) 0.5 MG tablet Take 1 tablet (0.5 mg total) by mouth at bedtime as needed for anxiety. 30 tablet 2   aspirin 81 MG chewable tablet Chew  1 tablet (81 mg total) by mouth 2 (two) times daily. 60 tablet 0   atorvastatin (LIPITOR) 80 MG tablet TAKE 1 TABLET BY MOUTH DAILY AT 6PM 90 tablet 0   BRILINTA 60 MG TABS tablet TAKE (1) TABLET BY MOUTH TWICE DAILY. 180 tablet 3   carvedilol (COREG) 25 MG tablet Take 1 tablet (25 mg total) by mouth 2 (two) times daily with a meal. 180 tablet 3   Cholecalciferol (VITAMIN D) 50 MCG (2000 UT) CAPS Take 2,000 Units by mouth daily with breakfast.     cinacalcet (SENSIPAR) 30 MG tablet Take 30 mg by mouth daily.     diclofenac Sodium (VOLTAREN) 1 % GEL Apply 2 g topically 4 (four) times daily. 50 g 0   furosemide (LASIX) 40 MG tablet Take 40 mg by mouth daily as needed for fluid.     ibuprofen (ADVIL) 600 MG tablet Take 1 tablet (600 mg total) by mouth every 6 (six) hours as needed. 180 tablet 0   ivabradine (CORLANOR) 5 MG TABS tablet TAKE 1 TABLET BY MOUTH TWICE DAILY WITH A MEAL 180 tablet 1   lidocaine (XYLOCAINE) 2 % solution Use as directed 10 mLs in the mouth or throat every 3 (three) hours as needed for mouth pain. 100 mL 0   methocarbamol (ROBAXIN) 500 MG tablet Take 1-2 tablets (500-1,000 mg total) by mouth every 6 (six) hours as needed for muscle spasms. 60 tablet 0   nitroGLYCERIN (NITROSTAT) 0.4 MG SL tablet Place 1 tablet (0.4 mg total) under the tongue every 5 (five) minutes as needed. 25 tablet 2   Oxycodone HCl 10 MG TABS Take 1 tablet (10 mg total) by mouth 2 (two) times daily as needed (Severe pain). 60 tablet 0   pantoprazole (PROTONIX) 40 MG tablet TAKE (1) TABLET BY MOUTH TWICE DAILY BEFORE A MEAL. 60 tablet 0   sodium bicarbonate 650 MG tablet Take 650 mg by mouth 2 (two) times daily.     telmisartan (MICARDIS) 20 MG tablet Take 10 mg by mouth daily. (Patient not taking: Reported on 08/11/2022)     No current facility-administered medications for this visit.    Allergies as of 11/26/2022   (No Known Allergies)    Family History  Problem Relation Age of Onset   Heart  failure Mother    Heart disease Mother    Cancer Mother        breast   Heart attack Father    Hypertension Father    Heart disease Father    Diabetes Brother     Social History   Socioeconomic History   Marital status: Single    Spouse name: Not on file   Number of children: 0   Years of education: Not on file   Highest education level: Some college, no degree  Occupational History   Occupation: retired  Tobacco Use   Smoking status: Former    Current packs/day: 0.00  Average packs/day: 0.5 packs/day for 40.0 years (20.0 ttl pk-yrs)    Types: Cigarettes    Start date: 03/12/1979    Quit date: 03/12/2019    Years since quitting: 3.7   Smokeless tobacco: Never  Vaping Use   Vaping status: Never Used  Substance and Sexual Activity   Alcohol use: No   Drug use: No   Sexual activity: Not Currently  Other Topics Concern   Not on file  Social History Narrative   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       Enjoys: fishing, shopping, working around the house       Diet: working on changes, avoiding fried foods, increased veggies   Caffeine: teas some daily   Water: 6-8 cups daily       Wears seat belt    Does not use phone while driving    Psychologist, sport and 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    Social Determinants of Health   Financial Resource Strain: Low Risk  (01/19/2022)   Overall Financial Resource Strain (CARDIA)    Difficulty of Paying Living Expenses: Not hard at all  Food Insecurity: No Food Insecurity (04/03/2022)   Hunger Vital Sign    Worried About Running Out of Food in the Last Year: Never true    Ran Out of Food in the Last Year: Never true  Transportation Needs: No Transportation Needs (02/02/2022)   PRAPARE - Administrator, Civil Service (Medical): No    Lack of Transportation (Non-Medical): No  Physical Activity: Sufficiently Active (01/19/2022)   Exercise Vital Sign    Days of Exercise per Week: 7 days    Minutes of Exercise per Session: 30 min  Stress: No  Stress Concern Present (01/19/2022)   [employer] of Occupational Health - Occupational Stress Questionnaire    Feeling of Stress : Not at all  Social Connections: Moderately Isolated (01/19/2022)   Social Connection and Isolation Panel [NHANES]    Frequency of Communication with Friends and Family: More than three times a week    Frequency of Social Gatherings with Friends and Family: More than three times a week    Attends Religious Services: More than 4 times per year    Active Member of [employer] or Organizations: No    Attends Banker Meetings: Never    Marital Status: Never married    Review of Systems: Gen: Denies fever, chills, anorexia. Denies fatigue, weakness, weight loss.  CV: Denies chest pain, palpitations, syncope, peripheral edema, and claudication. Resp: Denies dyspnea at rest, cough, wheezing, coughing up blood, and pleurisy. GI: Denies vomiting blood, jaundice, and fecal incontinence.   Denies dysphagia or odynophagia. Derm: Denies rash, itching, dry skin Psych: Denies depression, anxiety, memory loss, confusion. No homicidal or suicidal ideation.  Heme: Denies bruising, bleeding, and enlarged lymph nodes.  Physical Exam: There were no vitals taken for this visit. General:   Alert and oriented. No distress noted. Pleasant and cooperative.  Head:  Normocephalic and atraumatic. Eyes:  Conjuctiva clear without scleral icterus. Heart:  S1, S2 present without murmurs appreciated. Lungs:  Clear to auscultation bilaterally. No wheezes, rales, or rhonchi. No distress.  Abdomen:  +BS, soft, non-tender and non-distended. No rebound or guarding. No HSM or masses noted. Msk:  Symmetrical without gross deformities. Normal posture. Extremities:  Without edema. Neurologic:  Alert and  oriented x4 Psych:  Normal mood and affect.    Assessment:     Plan:  ***   Ermalinda Memos, PA-C [facility]  Gastroenterology 11/26/2022

## 2022-11-24 NOTE — Telephone Encounter (Signed)
Prescription Request  11/24/2022  LOV: 10/26/2022  What is the name of the medication or equipment? ALPRAZolam (XANAX) 0.5 MG tablet [[id number]]   Have you contacted your pharmacy to request a refill? Yes   Which pharmacy would you like this sent to?  [facility] - [place], [place] - [address] [place] [address] Phone: [phone] Fax: [phone]    Patient notified that their request is being sent to the clinical staff for review and that they should receive a response within 2 business days.   Please advise at Mobile [phone] (mobile)

## 2022-11-24 NOTE — Telephone Encounter (Signed)
Per Apolinar Junes from [facility] , patient has refills and he will filled the prescription for patient

## 2022-11-26 ENCOUNTER — Ambulatory Visit: Payer: Medicare HMO | Admitting: Gastroenterology

## 2022-11-26 ENCOUNTER — Ambulatory Visit: Payer: Medicare HMO | Admitting: Internal Medicine

## 2022-11-26 ENCOUNTER — Encounter: Payer: Self-pay | Admitting: Gastroenterology

## 2022-11-27 ENCOUNTER — Ambulatory Visit (INDEPENDENT_AMBULATORY_CARE_PROVIDER_SITE_OTHER): Payer: Medicare HMO

## 2022-11-27 DIAGNOSIS — I255 Ischemic cardiomyopathy: Secondary | ICD-10-CM | POA: Diagnosis not present

## 2022-11-27 LAB — CUP PACEART REMOTE DEVICE CHECK
Battery Remaining Longevity: 150 mo
Battery Remaining Percentage: 100 %
Brady Statistic RV Percent Paced: 0 %
Date Time Interrogation Session: 20240726040000
HighPow Impedance: 71 Ohm
Implantable Lead Connection Status: 753985
Implantable Lead Implant Date: 20210422
Implantable Lead Location: 753860
Implantable Lead Model: 138
Implantable Lead Serial Number: 302700
Implantable Pulse Generator Implant Date: 20210422
Lead Channel Impedance Value: 410 Ohm
Lead Channel Setting Pacing Amplitude: 2.5 V
Lead Channel Setting Pacing Pulse Width: 0.4 ms
Lead Channel Setting Sensing Sensitivity: 0.5 mV
Pulse Gen Serial Number: 209495
Zone Setting Status: 755011

## 2022-12-04 NOTE — Progress Notes (Signed)
Remote ICD transmission.   

## 2022-12-11 ENCOUNTER — Other Ambulatory Visit: Payer: Self-pay | Admitting: Internal Medicine

## 2022-12-11 ENCOUNTER — Telehealth: Payer: Self-pay | Admitting: Internal Medicine

## 2022-12-11 DIAGNOSIS — M170 Bilateral primary osteoarthritis of knee: Secondary | ICD-10-CM

## 2022-12-11 DIAGNOSIS — G894 Chronic pain syndrome: Secondary | ICD-10-CM

## 2022-12-11 MED ORDER — OXYCODONE HCL 10 MG PO TABS
10.0000 mg | ORAL_TABLET | Freq: Two times a day (BID) | ORAL | 0 refills | Status: DC | PRN
Start: 2022-12-14 — End: 2023-01-07

## 2022-12-11 NOTE — Telephone Encounter (Signed)
Prescription Request  12/11/2022  LOV: 10/26/2022  What is the name of the medication or equipment? Oxycodone HCl 10 MG TABS [[id number]]    Have you contacted your pharmacy to request a refill? No   Which pharmacy would you like this sent to?  [facility] - [place], [place] - [address] [place] [address] Phone: [phone] Fax: [phone]    Patient notified that their request is being sent to the clinical staff for review and that they should receive a response within 2 business days.   Please advise at [facility] [phone]

## 2022-12-22 ENCOUNTER — Telehealth: Payer: Self-pay | Admitting: Internal Medicine

## 2022-12-22 NOTE — Telephone Encounter (Signed)
Prescription Request  12/22/2022  LOV: 10/26/2022  What is the name of the medication or equipment? ALPRAZolam (XANAX) 0.5 MG tablet [[id number]]    Have you contacted your pharmacy to request a refill? Yes   Which pharmacy would you like this sent to?  [facility] - [place], [place] - [address]  [place] [address] Phone: [phone] Fax: [phone]    Patient notified that their request is being sent to the clinical staff for review and that they should receive a response within 2 business days.   Please advise at Mobile [phone] (mobile)

## 2022-12-22 NOTE — Telephone Encounter (Signed)
[facility] has rx, will fill and contact patient

## 2023-01-07 ENCOUNTER — Other Ambulatory Visit: Payer: Self-pay | Admitting: Internal Medicine

## 2023-01-07 DIAGNOSIS — G894 Chronic pain syndrome: Secondary | ICD-10-CM

## 2023-01-07 DIAGNOSIS — M170 Bilateral primary osteoarthritis of knee: Secondary | ICD-10-CM

## 2023-01-20 ENCOUNTER — Other Ambulatory Visit: Payer: Self-pay | Admitting: Internal Medicine

## 2023-01-20 ENCOUNTER — Telehealth: Payer: Self-pay | Admitting: Internal Medicine

## 2023-01-20 DIAGNOSIS — M170 Bilateral primary osteoarthritis of knee: Secondary | ICD-10-CM

## 2023-01-20 DIAGNOSIS — G894 Chronic pain syndrome: Secondary | ICD-10-CM

## 2023-01-20 DIAGNOSIS — F411 Generalized anxiety disorder: Secondary | ICD-10-CM

## 2023-01-20 MED ORDER — ALPRAZOLAM 0.5 MG PO TABS
0.5000 mg | ORAL_TABLET | Freq: Every evening | ORAL | 2 refills | Status: DC | PRN
Start: 2023-01-20 — End: 2023-03-03

## 2023-01-20 NOTE — Telephone Encounter (Signed)
Prescription Request  01/20/2023  LOV: 10/26/2022  What is the name of the medication or equipment? ALPRAZolam (XANAX) 0.5 MG tablet   Have you contacted your pharmacy to request a refill? Yes   Which pharmacy would you like this sent to?  [facility] - [place], [place] - [address] [place] [address] Phone: [phone] Fax: [phone]    Patient notified that their request is being sent to the clinical staff for review and that they should receive a response within 2 business days.   Please advise at Mobile [phone] (mobile)

## 2023-01-20 NOTE — Telephone Encounter (Signed)
[facility]

## 2023-01-20 NOTE — Telephone Encounter (Signed)
Also needs refill on Oxycodone HCl 10 MG TABS [[id number]]  Wants a cll back when meds are sent to pharm so that he can get meds picked up

## 2023-01-25 ENCOUNTER — Ambulatory Visit: Payer: Medicare HMO | Admitting: Internal Medicine

## 2023-02-04 ENCOUNTER — Other Ambulatory Visit: Payer: Self-pay | Admitting: Internal Medicine

## 2023-02-04 ENCOUNTER — Telehealth: Payer: Self-pay | Admitting: Internal Medicine

## 2023-02-04 DIAGNOSIS — G894 Chronic pain syndrome: Secondary | ICD-10-CM

## 2023-02-04 DIAGNOSIS — M170 Bilateral primary osteoarthritis of knee: Secondary | ICD-10-CM

## 2023-02-04 MED ORDER — OXYCODONE HCL 10 MG PO TABS
10.0000 mg | ORAL_TABLET | Freq: Two times a day (BID) | ORAL | 0 refills | Status: DC | PRN
Start: 2023-02-09 — End: 2023-03-03

## 2023-02-04 NOTE — Telephone Encounter (Signed)
Prescription Request  02/04/2023  LOV: 10/26/2022  What is the name of the medication or equipment? Oxycodone HCl 10 MG TABS [[id number]]    Have you contacted your pharmacy to request a refill? No   Which pharmacy would you like this sent to?  [facility] - [place], [place] - [address] [place] [address] Phone: [phone] Fax: [phone]    Patient notified that their request is being sent to the clinical staff for review and that they should receive a response within 2 business days.   Please advise at [facility] [phone]

## 2023-02-04 NOTE — Telephone Encounter (Signed)
[facility]

## 2023-02-04 NOTE — Telephone Encounter (Signed)
Pt called back in regard to refill request.

## 2023-02-08 ENCOUNTER — Ambulatory Visit: Payer: Medicare HMO | Admitting: Internal Medicine

## 2023-02-19 ENCOUNTER — Telehealth: Payer: Self-pay | Admitting: Internal Medicine

## 2023-02-19 ENCOUNTER — Other Ambulatory Visit: Payer: Self-pay | Admitting: Physical Medicine and Rehabilitation

## 2023-02-19 MED ORDER — IBUPROFEN 600 MG PO TABS: 600.0000 mg | ORAL_TABLET | Freq: Four times a day (QID) | ORAL | 0 refills | Status: DC | PRN

## 2023-02-19 NOTE — Telephone Encounter (Signed)
Prescription Request  02/19/2023  LOV: 10/26/2022  What is the name of the medication or equipment? ALPRAZolam (XANAX) 0.5 MG tablet [[id number]]    Have you contacted your pharmacy to request a refill? No   Which pharmacy would you like this sent to?  [facility] - [place], [place] - [address] [place] [address] Phone: [phone] Fax: [phone]    Patient notified that their request is being sent to the clinical staff for review and that they should receive a response within 2 business days.   Please advise at [facility] [phone]

## 2023-02-22 ENCOUNTER — Other Ambulatory Visit: Payer: Self-pay | Admitting: Internal Medicine

## 2023-02-22 NOTE — Telephone Encounter (Signed)
[facility]

## 2023-02-26 LAB — PROTEIN / CREATININE RATIO, URINE: Creatinine, Urine: 72.6

## 2023-02-26 LAB — COMPREHENSIVE METABOLIC PANEL: eGFR: 24

## 2023-03-02 ENCOUNTER — Other Ambulatory Visit (HOSPITAL_COMMUNITY): Payer: Self-pay | Admitting: Internal Medicine

## 2023-03-02 ENCOUNTER — Other Ambulatory Visit: Payer: Self-pay | Admitting: Internal Medicine

## 2023-03-03 ENCOUNTER — Ambulatory Visit (INDEPENDENT_AMBULATORY_CARE_PROVIDER_SITE_OTHER): Payer: Medicare HMO | Admitting: Internal Medicine

## 2023-03-03 ENCOUNTER — Encounter: Payer: Self-pay | Admitting: Internal Medicine

## 2023-03-03 VITALS — BP 138/86 | HR 82 | Ht 72.0 in | Wt 198.0 lb

## 2023-03-03 DIAGNOSIS — I10 Essential (primary) hypertension: Secondary | ICD-10-CM | POA: Diagnosis not present

## 2023-03-03 DIAGNOSIS — F411 Generalized anxiety disorder: Secondary | ICD-10-CM | POA: Diagnosis not present

## 2023-03-03 DIAGNOSIS — E1121 Type 2 diabetes mellitus with diabetic nephropathy: Secondary | ICD-10-CM | POA: Diagnosis not present

## 2023-03-03 DIAGNOSIS — M170 Bilateral primary osteoarthritis of knee: Secondary | ICD-10-CM

## 2023-03-03 DIAGNOSIS — N184 Chronic kidney disease, stage 4 (severe): Secondary | ICD-10-CM | POA: Diagnosis not present

## 2023-03-03 DIAGNOSIS — G894 Chronic pain syndrome: Secondary | ICD-10-CM

## 2023-03-03 MED ORDER — ALPRAZOLAM 0.5 MG PO TABS: 0.5000 mg | ORAL_TABLET | Freq: Two times a day (BID) | ORAL | 2 refills | Status: DC | PRN

## 2023-03-03 MED ORDER — OXYCODONE HCL 10 MG PO TABS: 10.0000 mg | ORAL_TABLET | Freq: Two times a day (BID) | ORAL | 0 refills | Status: DC | PRN

## 2023-03-03 NOTE — Assessment & Plan Note (Addendum)
Lab Results  Component Value Date   HGBA1C 5.8 (H) 01/23/2022   Diet controlled Denies HbA1c check On statin Would be a good candidate for SGLT2i, but GFR close to 20 now On Telmisartan now for proteinuria

## 2023-03-03 NOTE — Assessment & Plan Note (Addendum)
Uncontrolled, likely due to lack of sleep and grief reaction from his mother's death Has responded well to Xanax in the past Increased frequency of Xanax to 0.5 mg BID as needed Avoid trazodone or Elavil for now considering his cardiac history

## 2023-03-03 NOTE — Patient Instructions (Signed)
Please start taking Xanax twice daily.  Please take Oxycodone up to twice daily for severe pain only.  Please continue to take medications as prescribed.  Please continue to follow low salt diet and perform moderate exercise/walking as tolerated.

## 2023-03-03 NOTE — Assessment & Plan Note (Signed)
BP Readings from Last 1 Encounters:  03/03/23 138/86   Well-controlled, followed by Nephrology On Coreg 12.5 mg BID Counseled for compliance with the medications Advised DASH diet and ambulation as tolerated

## 2023-03-05 NOTE — Assessment & Plan Note (Signed)
From OA of knee and DDD of lumbar spine On oxycodone 10 mg twice daily as needed for severe pain Needs urine tox assure -has been noncompliant with dosing

## 2023-03-05 NOTE — Assessment & Plan Note (Signed)
S/p left TKA Has seen Orthopedic surgery, planning to get right TKA Has had steroid injection in the past Unable to take NSAIDs due to CKD and CAD Refilled Oxycodone for now due to short-supply of Percocet, advised to take it BID PRN and avoid TID dose

## 2023-03-05 NOTE — Assessment & Plan Note (Signed)
Progressive CKD stage 4 Follows up with Nephrology On Sensipar and sodium bicarb tablets Avoid nephrotoxic agents, including NSAIDs

## 2023-03-06 LAB — TOXASSURE SELECT 13 (MW), URINE

## 2023-03-09 ENCOUNTER — Encounter (HOSPITAL_COMMUNITY): Payer: Self-pay | Admitting: Internal Medicine

## 2023-03-09 ENCOUNTER — Ambulatory Visit (HOSPITAL_COMMUNITY)
Admission: RE | Admit: 2023-03-09 | Discharge: 2023-03-09 | Disposition: A | Discharge status: Home / Self Care | Payer: Medicare HMO | Source: Ambulatory Visit | Attending: Internal Medicine | Admitting: Internal Medicine

## 2023-03-09 VITALS — BP 122/80 | HR 64 | Wt 207.4 lb

## 2023-03-09 DIAGNOSIS — R9431 Abnormal electrocardiogram [ECG] [EKG]: Secondary | ICD-10-CM | POA: Diagnosis not present

## 2023-03-09 DIAGNOSIS — I5022 Chronic systolic (congestive) heart failure: Secondary | ICD-10-CM | POA: Diagnosis not present

## 2023-03-09 DIAGNOSIS — I5023 Acute on chronic systolic (congestive) heart failure: Secondary | ICD-10-CM | POA: Diagnosis not present

## 2023-03-09 DIAGNOSIS — N184 Chronic kidney disease, stage 4 (severe): Secondary | ICD-10-CM | POA: Diagnosis not present

## 2023-03-09 DIAGNOSIS — E1122 Type 2 diabetes mellitus with diabetic chronic kidney disease: Secondary | ICD-10-CM | POA: Diagnosis not present

## 2023-03-09 DIAGNOSIS — I252 Old myocardial infarction: Secondary | ICD-10-CM | POA: Diagnosis not present

## 2023-03-09 DIAGNOSIS — Z87891 Personal history of nicotine dependence: Secondary | ICD-10-CM | POA: Diagnosis not present

## 2023-03-09 DIAGNOSIS — Z955 Presence of coronary angioplasty implant and graft: Secondary | ICD-10-CM | POA: Diagnosis not present

## 2023-03-09 DIAGNOSIS — I10 Essential (primary) hypertension: Secondary | ICD-10-CM

## 2023-03-09 DIAGNOSIS — I130 Hypertensive heart and chronic kidney disease with heart failure and stage 1 through stage 4 chronic kidney disease, or unspecified chronic kidney disease: Secondary | ICD-10-CM | POA: Diagnosis not present

## 2023-03-09 DIAGNOSIS — I2510 Atherosclerotic heart disease of native coronary artery without angina pectoris: Secondary | ICD-10-CM | POA: Diagnosis not present

## 2023-03-09 LAB — BASIC METABOLIC PANEL
Anion gap: 10 (ref 5–15)
BUN: 31 mg/dL — ABNORMAL HIGH (ref 8–23)
CO2: 21 mmol/L — ABNORMAL LOW (ref 22–32)
Calcium: 10 mg/dL (ref 8.9–10.3)
Chloride: 114 mmol/L — ABNORMAL HIGH (ref 98–111)
Creatinine, Ser: 3.12 mg/dL — ABNORMAL HIGH (ref 0.61–1.24)
GFR, Estimated: 21 mL/min — ABNORMAL LOW (ref >60)
Glucose, Bld: 97 mg/dL (ref 70–99)
Potassium: 4.7 mmol/L (ref 3.5–5.1)
Sodium: 145 mmol/L (ref 135–145)

## 2023-03-09 LAB — BRAIN NATRIURETIC PEPTIDE: B Natriuretic Peptide: 1364.9 pg/mL — ABNORMAL HIGH (ref 0.0–100.0)

## 2023-03-09 MED ORDER — FUROSEMIDE 40 MG PO TABS: 40.0000 mg | ORAL_TABLET | ORAL | 3 refills | Status: DC

## 2023-03-09 NOTE — Progress Notes (Signed)
ADVANCED HF CLINIC NOTE   Primary Cardiologist: Dr. Excell Seltzer Primary Care: Dr. Volney American AHF: Dr. Gala Romney   HPI: [name] is a 67 y.o. male, former smoker with HTN, CKD IV, CAD s/p large anterolateral STEMI 9/20 and systolic HF. Has [employer] ICD.   Cath 03/12/2019 Total occlusion of the mid LAD treated with angioplasty and stenting using a 4.0 x 22 Onyx deployed at 14 atm.  0% stenosis was noted post procedure and TIMI grade III flow increased from 0 to 3.  The final angiographic images of the LAD demonstrates apical occlusion due to embolus. Luminal irregularities in the left main but no agreeable stenosis. Large circumflex with 3 obtuse marginals.  The first marginal is large and has a very distal 90% stenosis in 2 branches.  There is a trifurcation distally in the first marginal. RCA Moderate diffuse luminal irregularities but widely patent. Mid anterior wall to apical akinesis.  EF 30 to 35% acutely.  LVEDP 31 mmHg.  Post cath had low BP and shock but recovered. EF 20% by echo. No significant MR. Creatinine post cath 3.1-3.4. Now followed by nephrology.  Echo 3/21. EF 20-25%. RV normal.  S/P [employer] ICD implant 08/2019   Admitted to [facility] 10/22 with hgb 4.6 and SCr up to 4.2.Transfused 4u RBCs. Underwent EGD/colonoscopy 4mm x 8 mm prepyloric ulcer wuth prepyloric gastritis and duodenitis.   Echo 10/22 EF 30-35%  Echo 6/23 EF 20-25% RV ok   Today he returns for HF follow up. We last saw him in 3/23. Says he is doing ok more SOB. Says he thinks he needs an inhaler. No edema. + orthopnea. Says his kidneys are "stable" Last SCr I have is 2.8 Follows with Dr. Wolfgang Phoenix. Only takes lasix as needed. Hasn't taken it recently   ICD interrogated today: HL score 29 No VT/AF Activity level 1.6hr/day. Personally reviewed   Review of systems complete and found to be negative unless listed in HPI.    Past Medical History:  Diagnosis Date   Acute ST elevation  myocardial infarction (STEMI) due to occlusion of mid portion of left anterior descending (LAD) coronary artery (HCC) 03/12/2019   Acute systolic heart failure (HCC)    AICD (automatic cardioverter/defibrillator) present    Arthritis    CHF (congestive heart failure) (HCC)    CKD (chronic kidney disease) stage 4, GFR 15-29 ml/min (HCC)    Diabetes mellitus, type II (HCC)    Dyspnea    with exertionm periodically   Heart attack (HCC) 03/15/2019   anterior MI    History of kidney stones    Hyperparathyroidism (HCC)    Hypertension 2002   Ischemic cardiomyopathy    Left knee pain    Syncope 04/04/2019   after blood draw   Vitamin D deficiency    Current Outpatient Medications  Medication Sig Dispense Refill   allopurinol (ZYLOPRIM) 100 MG tablet Take 100 mg by mouth daily.      ALPRAZolam (XANAX) 0.5 MG tablet Take 1 tablet (0.5 mg total) by mouth 2 (two) times daily as needed for anxiety. 60 tablet 2   aspirin 81 MG chewable tablet Chew 1 tablet (81 mg total) by mouth 2 (two) times daily. 60 tablet 0   atorvastatin (LIPITOR) 80 MG tablet TAKE 1 TABLET BY MOUTH DAILY AT 6PM 90 tablet 0   BRILINTA 60 MG TABS tablet TAKE (1) TABLET BY MOUTH TWICE DAILY. 180 tablet 3   carvedilol (COREG) 12.5 MG tablet TAKE 1 TABLET  BY MOUTH TWICE DAILY WITH MEALS 180 tablet 3   Cholecalciferol (VITAMIN D) 50 MCG (2000 UT) CAPS Take 2,000 Units by mouth daily with breakfast.     cinacalcet (SENSIPAR) 30 MG tablet Take 30 mg by mouth daily.     furosemide (LASIX) 40 MG tablet Take 40 mg by mouth daily as needed for fluid.     ivabradine (CORLANOR) 5 MG TABS tablet TAKE 1 TABLET BY MOUTH TWICE DAILY WITH A MEAL 180 tablet 1   methocarbamol (ROBAXIN) 500 MG tablet Take 1-2 tablets (500-1,000 mg total) by mouth every 6 (six) hours as needed for muscle spasms. 60 tablet 0   nitroGLYCERIN (NITROSTAT) 0.4 MG SL tablet Place 1 tablet (0.4 mg total) under the tongue every 5 (five) minutes as needed. 25 tablet 2    Oxycodone HCl 10 MG TABS Take 1 tablet (10 mg total) by mouth every 12 (twelve) hours as needed (Pain). 60 tablet 0   pantoprazole (PROTONIX) 40 MG tablet TAKE (1) TABLET BY MOUTH TWICE DAILY BEFORE A MEAL. 60 tablet 0   telmisartan (MICARDIS) 20 MG tablet Take 10 mg by mouth daily. (Patient not taking: Reported on 03/09/2023)     No current facility-administered medications for this encounter.   No Known Allergies  Social History   Socioeconomic History   Marital status: Single    Spouse name: Not on file   Number of children: 0   Years of education: Not on file   Highest education level: Some college, no degree  Occupational History   Occupation: retired  Tobacco Use   Smoking status: Former    Current packs/day: 0.00    Average packs/day: 0.5 packs/day for 40.0 years (20.0 ttl pk-yrs)    Types: Cigarettes    Start date: 03/12/1979    Quit date: 03/12/2019    Years since quitting: 3.9   Smokeless tobacco: Never  Vaping Use   Vaping status: Never Used  Substance and Sexual Activity   Alcohol use: No   Drug use: No   Sexual activity: Not Currently  Other Topics Concern   Not on file  Social History Narrative   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       Enjoys: fishing, shopping, working around the house       Diet: working on changes, avoiding fried foods, increased veggies   Caffeine: teas some daily   Water: 6-8 cups daily       Wears seat belt    Does not use phone while driving    Psychologist, sport and 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    Social Determinants of Health   Financial Resource Strain: Low Risk  (01/19/2022)   Overall Financial Resource Strain (CARDIA)    Difficulty of Paying Living Expenses: Not hard at all  Food Insecurity: No Food Insecurity (04/03/2022)   Hunger Vital Sign    Worried About Running Out of Food in the Last Year: Never true    Ran Out of Food in the Last Year: Never true  Transportation Needs: No Transportation Needs (02/02/2022)   PRAPARE - Therapist, art (Medical): No    Lack of Transportation (Non-Medical): No  Physical Activity: Sufficiently Active (01/19/2022)   Exercise Vital Sign    Days of Exercise per Week: 7 days    Minutes of Exercise per Session: 30 min  Stress: No Stress Concern Present (01/19/2022)   [employer] of Occupational Health - Occupational Stress Questionnaire    Feeling of Stress :  Not at all  Social Connections: Moderately Isolated (01/19/2022)   Social Connection and Isolation Panel [NHANES]    Frequency of Communication with Friends and Family: More than three times a week    Frequency of Social Gatherings with Friends and Family: More than three times a week    Attends Religious Services: More than 4 times per year    Active Member of [employer] or Organizations: No    Attends Banker Meetings: Never    Marital Status: Never married  Intimate Partner Violence: Not At Risk (02/02/2022)   Humiliation, Afraid, Rape, and Kick questionnaire    Fear of Current or Ex-Partner: No    Emotionally Abused: No    Physically Abused: No    Sexually Abused: No    Family History  Problem Relation Age of Onset   Heart failure Mother    Heart disease Mother    Cancer Mother        breast   Heart attack Father    Hypertension Father    Heart disease Father    Diabetes Brother    BP 122/80   Pulse 64   Wt 94.1 kg (207 lb 6.4 oz)   SpO2 98%   BMI 28.13 kg/m   Wt Readings from Last 3 Encounters:  03/09/23 94.1 kg (207 lb 6.4 oz)  03/03/23 89.8 kg (198 lb 0.6 oz)  10/26/22 98.5 kg (217 lb 3.2 oz)   PHYSICAL EXAM: General:  Well appearing. No resp difficulty HEENT: normal Neck: supple. JVP to jaw . Carotids 2+ bilat; no bruits. No lymphadenopathy or thryomegaly appreciated. Cor: PMI nondisplaced. Regular rate & rhythm. No rubs, gallops or murmurs. Lungs: clear Abdomen: soft, nontender, nondistended. No hepatosplenomegaly. No bruits or masses. Good bowel sounds. Extremities: no  cyanosis, clubbing, rash, 2+ edema Neuro: alert & orientedx3, cranial nerves grossly intact. moves all 4 extremities w/o difficulty. Affect pleasant    ASSESSMENT & PLAN:  1. CAD s/p acute anterolateral STEMI 03/12/19 - s/p DES LAD 11/20. - No s/s angina. - Continue ASA 81 + Brilinta.  - Continue atorvastatin 80 mg.   2. Acute on chronic Systolic HF - EF 82%. Following anteriorlateral stemi - Echo 3/21: EF 20-25%. RV ok.  - Echo 10/22: EF 30-35% in setting of severe anemia - Has [employer] ICD.  - ICD interrogated today: HL score 29 No VT/AF Activity level 1.6hr/day. Personally reviewed - NYHA II-III volume status elevated.  - Currently taking lasix 40mg  rarely - Take lasix 40 mg daily x 2 days then 40mg  every MWF.  - Will see back soon and adjust diuretics as needed - Continue carvedilol 12.5 mg bid.  - Continue ivabradine 5 mg bid. HR 63 today. - Not candidate for Entresto, SGLT2i or spiro with CKD IV. - I worry his HF is progressing. Likely not candidate for heart-kidney transplant given age - Labs today  3. CKD IV - Due to HTN nephropathy - SCr ranges 2.8-3.8 (GFR has ranged 10-20) - Follows with Dr. Holly Bodily - Will not start SCLT2i with GFR hovering around 20  4. HTN - Well controlled. - Keep off BiDil for now. May add back later. - Do not want to drop BP too low with CKD IV.  5. Tobacco use - Quit after MI.  - Congratulated on continued cessation.    Arvilla Meres, MD  9:50 AM

## 2023-03-09 NOTE — Patient Instructions (Addendum)
Medication Changes:  RESUME Furosemide 40 mg Daily FOR 2 DAYS ONLY, then decrease to 40 mg every Monday, Wednesday, and Friday  Lab Work:  Labs done today, your results will be available in MyChart, we will contact you for abnormal readings.  Testing/Procedures:  Your physician has requested that you have an echocardiogram. Echocardiography is a painless test that uses sound waves to create images of your heart. It provides your doctor with information about the size and shape of your heart and how well your heart's chambers and valves are working. This procedure takes approximately one hour. There are no restrictions for this procedure. Please do NOT wear cologne, perfume, aftershave, or lotions (deodorant is allowed). Please arrive 15 minutes prior to your appointment time.  Please note: We ask at that you not bring children with you during ultrasound (echo/ vascular) testing. Due to room size and safety concerns, children are not allowed in the ultrasound rooms during exams. Our front office staff cannot provide observation of children in our lobby area while testing is being conducted. An adult accompanying a patient to their appointment will only be allowed in the ultrasound room at the discretion of the ultrasound technician under special circumstances. We apologize for any inconvenience.   Referrals:  none  Special Instructions // Education:  Do the following things EVERYDAY: Weigh yourself in the morning before breakfast. Write it down and keep it in a log. Take your medicines as prescribed Eat low salt foods--Limit salt (sodium) to 2000 mg per day.  Stay as active as you can everyday Limit all fluids for the day to less than 2 liters   Follow-Up in: 2 weeks (Wed 11/20 at 8:30 am)   At the [facility], you and your health needs are our priority. We have a designated team specialized in the treatment of Heart Failure. This Care Team includes your primary  Heart Failure Specialized Cardiologist (physician), Advanced Practice Providers (APPs- Physician Assistants and Nurse Practitioners), and Pharmacist who all work together to provide you with the care you need, when you need it.   You may see any of the following providers on your designated Care Team at your next follow up:  Dr. Arvilla Meres Dr. Marca Ancona Dr. Dorthula Nettles Dr. Theresia Bough Tonye Becket, NP Robbie Lis, [place] [facility] [place], [place] Brynda Peon, NP [place] Lee, NP Karle Plumber, PharmD   Please be sure to bring in all your medications bottles to every appointment.   Need to Contact us:  If you have any questions or concerns before your next appointment please send [place] a message through [place] or call our office at [phone].    TO LEAVE A MESSAGE FOR THE NURSE SELECT OPTION 2, PLEASE LEAVE A MESSAGE INCLUDING: YOUR NAME DATE OF BIRTH CALL BACK NUMBER REASON FOR CALL**this is important as we prioritize the call backs  YOU WILL RECEIVE A CALL BACK THE SAME DAY AS LONG AS YOU CALL BEFORE 4:00 PM

## 2023-03-09 NOTE — Progress Notes (Signed)
ReDS Vest / Clip - 03/09/23 1000       ReDS Vest / Clip   Station Marker D    Ruler Value 39    ReDS Value Range Low volume    ReDS Actual Value 33

## 2023-03-16 ENCOUNTER — Telehealth: Payer: Self-pay | Admitting: Internal Medicine

## 2023-03-16 NOTE — Telephone Encounter (Signed)
Prescription Request  03/16/2023  LOV: 03/03/2023  What is the name of the medication or equipment? ALPRAZolam (XANAX) 0.5 MG tablet [[id number]]  Pt did NOT pick up med when originally sent to pharm   Have you contacted your pharmacy to request a refill? No   Which pharmacy would you like this sent to?  [facility] - [place], [place] - [address] [place] [address] Phone: [phone] Fax: [phone]    Patient notified that their request is being sent to the clinical staff for review and that they should receive a response within 2 business days.   Please advise at [facility] [phone]

## 2023-03-16 NOTE — Telephone Encounter (Signed)
Pharmacy has medication , patient needs to contact pharmacy , unable to reach patient

## 2023-03-17 ENCOUNTER — Telehealth: Payer: Self-pay | Admitting: Physical Medicine and Rehabilitation

## 2023-03-17 ENCOUNTER — Telehealth: Payer: Self-pay

## 2023-03-17 NOTE — Telephone Encounter (Signed)
Copied from CRM [id number]. Topic: General - Other >> Mar 17, 2023  3:02 PM Prudencio Pair wrote: Reason for CRM: Patient called in requesting to speak with "Keya" from Clinic only. He would not give reason of the call. Called clincal access and was told that Evette Cristal was not at her desk currently and will have to contact pt back. Advised pt that she would call back. Please give him a call at [phone].

## 2023-03-17 NOTE — Telephone Encounter (Signed)
Patient needs a refill on Ib profen 800 mg.  He said you had filled it before for him.

## 2023-03-18 NOTE — Telephone Encounter (Signed)
LVM

## 2023-03-19 ENCOUNTER — Telehealth: Payer: Self-pay | Admitting: Internal Medicine

## 2023-03-19 NOTE — Telephone Encounter (Signed)
Received a request to refill Pantoprazole and the request was denied since the patient hasn't been seen in the office since 2022.  I left him a message to call and schedule an appointment.

## 2023-03-19 NOTE — Telephone Encounter (Signed)
noted 

## 2023-03-22 ENCOUNTER — Encounter: Payer: Medicare HMO | Attending: Physical Medicine and Rehabilitation | Admitting: Physical Medicine and Rehabilitation

## 2023-03-22 DIAGNOSIS — N184 Chronic kidney disease, stage 4 (severe): Secondary | ICD-10-CM | POA: Diagnosis not present

## 2023-03-22 DIAGNOSIS — M25561 Pain in right knee: Secondary | ICD-10-CM

## 2023-03-22 DIAGNOSIS — M25562 Pain in left knee: Secondary | ICD-10-CM

## 2023-03-22 DIAGNOSIS — G8929 Other chronic pain: Secondary | ICD-10-CM | POA: Diagnosis not present

## 2023-03-22 MED ORDER — LIDOCAINE 5 % EX PTCH: 1.0000 | MEDICATED_PATCH | CUTANEOUS | 0 refills | Status: DC

## 2023-03-22 NOTE — Progress Notes (Signed)
Subjective:     Patient ID: [name], male   DOB: January 11, 1956, 67 y.o.   MRN: [id number]  HPI  An audio/video tele-health visit is felt to be the most appropriate encounter for this patient at this time. This is a follow up tele-visit via phone. The patient is at home. MD is at office. Prior to scheduling this appointment, our staff discussed the limitations of evaluation and management by telemedicine and the availability of in-person appointments. The patient expressed understanding and agreed to proceed.   [name] is a 67 year old man who presents for f/u of severe bilateral knee OA.   -oxycodone and ibuprofen help but he cannot receive opioids medication from [place] given prior positive urine toxicology screen -he had a successful knee replacement last year and has plans for replacement of his other knee in June -he asks for a refill of his ibuprofen as he has run out. He says he has been clean of any recreational drugs.   Prior history: -He asks whether we can give him another chance to prescribe oxycodone for him. Discussed unfortunately for him that our clinic rules do not permit prescription of controlled medications when there has been cocaine present in urine sample.  -He is not sleeping well due to his pain. Discussed trying amitriptyline at night for pain and insomnia but I would like him to clear this with his cardiologist first.    -He has bilateral knee pain, 10/10, that is sharp, stabbing, and aching.  -he is ready to try Qutenza today -he is still frustrated that we cannot prescribe any opioids for him- his PCP did prescribe them for a short term -he asks why he can't have a second chance  -He used to walk a lot in his work.   -He has had injections in his bilateral knees with steroid and Monovisc without benefit. He has never tried oral steroids.   -He has tried Ibuprofen and Meloxicam without benefit. Discussed retrying this option with his cardiologist Dr.  Gala Romney, but it is contraindicated given his stage 2 CKD  -Fortunately he was recently cleared to get a left knee replacement by Dr. Gala Romney at his appointment yesterday- wears a Lifevest and has ICD- has been doing well in this regard.   As per patient he was referred here by Dr. Magnus Ivan as [name] is no longer providing benefit and Dr. Magnus Ivan felt Oxycodone may be warranted and should be managed by pain clinic.   2) CKD: -discussed that his nephrologist ordered his recent creatinine and discussed the results with him and started him on a medication  Pain Inventory Average Pain 9 Pain Right Now 9 My pain is constant, sharp, and tingling  In the last 24 hours, has pain interfered with the following? General activity 8 Relation with others 8 Enjoyment of life 8 What TIME of day is your pain at its worst? morning  and night Sleep (in general) Poor  Pain is worse with: walking and sitting Pain improves with: medication Relief from Meds: 10        Family History  Problem Relation Age of Onset   Heart failure Mother    Heart disease Mother    Cancer Mother        breast   Heart attack Father    Hypertension Father    Heart disease Father    Diabetes Brother    Social History   Socioeconomic History   Marital status: Single    Spouse name: Not  on file   Number of children: 0   Years of education: Not on file   Highest education level: Some college, no degree  Occupational History   Occupation: retired  Tobacco Use   Smoking status: Former    Current packs/day: 0.00    Average packs/day: 0.5 packs/day for 40.0 years (20.0 ttl pk-yrs)    Types: Cigarettes    Start date: 03/12/1979    Quit date: 03/12/2019    Years since quitting: 4.0   Smokeless tobacco: Never  Vaping Use   Vaping status: Never Used  Substance and Sexual Activity   Alcohol use: No   Drug use: No   Sexual activity: Not Currently  Other Topics Concern   Not on file  Social History  Narrative   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       Enjoys: fishing, shopping, working around the house       Diet: working on changes, avoiding fried foods, increased veggies   Caffeine: teas some daily   Water: 6-8 cups daily       Wears seat belt    Does not use phone while driving    Psychologist, sport and 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    Social Determinants of Health   Financial Resource Strain: Low Risk  (01/19/2022)   Overall Financial Resource Strain (CARDIA)    Difficulty of Paying Living Expenses: Not hard at all  Food Insecurity: No Food Insecurity (04/03/2022)   Hunger Vital Sign    Worried About Running Out of Food in the Last Year: Never true    Ran Out of Food in the Last Year: Never true  Transportation Needs: No Transportation Needs (02/02/2022)   PRAPARE - Administrator, Civil Service (Medical): No    Lack of Transportation (Non-Medical): No  Physical Activity: Sufficiently Active (01/19/2022)   Exercise Vital Sign    Days of Exercise per Week: 7 days    Minutes of Exercise per Session: 30 min  Stress: No Stress Concern Present (01/19/2022)   [employer] of Occupational Health - Occupational Stress Questionnaire    Feeling of Stress : Not at all  Social Connections: Moderately Isolated (01/19/2022)   Social Connection and Isolation Panel [NHANES]    Frequency of Communication with Friends and Family: More than three times a week    Frequency of Social Gatherings with Friends and Family: More than three times a week    Attends Religious Services: More than 4 times per year    Active Member of Clubs or Organizations: No    Attends Banker Meetings: Never    Marital Status: Never married   Past Surgical History:  Procedure Laterality Date   AMPUTATION TOE Left 1991   2 digit of left foot   BIOPSY  02/26/2021   Procedure: BIOPSY;  Surgeon: Malissa Hippo, MD;  Location: [facility];  Service: Endoscopy;;   CHOLECYSTECTOMY     COLONOSCOPY      COLONOSCOPY WITH PROPOFOL N/A 02/26/2021   Procedure: COLONOSCOPY WITH PROPOFOL;  Surgeon: Malissa Hippo, MD;  Location: [facility];  Service: Endoscopy;  Laterality: N/A;   CORONARY/GRAFT ACUTE MI REVASCULARIZATION N/A 03/12/2019   Procedure: Coronary/Graft Acute MI Revascularization;  Surgeon: Lyn Records, MD;  Location: [facility];  Service: Cardiovascular;  Laterality: N/A;   ESOPHAGOGASTRODUODENOSCOPY (EGD) WITH PROPOFOL N/A 02/26/2021   Procedure: ESOPHAGOGASTRODUODENOSCOPY (EGD) WITH PROPOFOL;  Surgeon: Malissa Hippo, MD;  Location: [facility];  Service:  Endoscopy;  Laterality: N/A;   ICD IMPLANT N/A 08/24/2019   Procedure: ICD IMPLANT;  Surgeon: Marinus Maw, MD;  Location: [facility] INVASIVE CV LAB;  Service: Cardiovascular;  Laterality: N/A;   IR URETERAL STENT LEFT NEW ACCESS W/O SEP NEPHROSTOMY CATH  09/26/2019   LEFT HEART CATH AND CORONARY ANGIOGRAPHY N/A 03/12/2019   Procedure: LEFT HEART CATH AND CORONARY ANGIOGRAPHY;  Surgeon: Lyn Records, MD;  Location: [facility];  Service: Cardiovascular;  Laterality: N/A;   LITHOTRIPSY Right    NEPHROLITHOTOMY Left 09/26/2019   Procedure: LEFT NEPHROLITHOTOMY PERCUTANEOUS;  Surgeon: Bjorn Pippin, MD;  Location: [facility];  Service: Urology;  Laterality: Left;   TOTAL KNEE ARTHROPLASTY Left 02/02/2022   Procedure: TOTAL KNEE ARTHROPLASTY;  Surgeon: Dannielle Huh, MD;  Location: [facility];  Service: Orthopedics;  Laterality: Left;   Past Medical History:  Diagnosis Date   Acute ST elevation myocardial infarction (STEMI) due to occlusion of mid portion of left anterior descending (LAD) coronary artery (HCC) 03/12/2019   Acute systolic heart failure (HCC)    AICD (automatic cardioverter/defibrillator) present    Arthritis    CHF (congestive heart failure) (HCC)    CKD (chronic kidney disease) stage 4, GFR 15-29 ml/min (HCC)    Diabetes mellitus, type II (HCC)    Dyspnea    with exertionm periodically   Heart attack (HCC)  03/15/2019   anterior MI    History of kidney stones    Hyperparathyroidism (HCC)    Hypertension 2002   Ischemic cardiomyopathy    Left knee pain    Syncope 04/04/2019   after blood draw   Vitamin D deficiency    There were no vitals taken for this visit.  Opioid Risk Score:   Fall Risk Score:  `1  Depression screen [facility] 2/9     03/03/2023   10:09 AM 10/26/2022    8:38 AM 06/01/2022    9:34 AM 03/31/2022    1:36 PM 01/19/2022    3:19 PM 01/19/2022    3:18 PM 09/17/2021   11:16 AM  Depression screen PHQ 2/9  Decreased Interest 0 0 0 1 0 0 0  Down, Depressed, Hopeless 0 0 0 0 0 0 0  PHQ - 2 Score 0 0 0 1 0 0 0  Altered sleeping 0        Tired, decreased energy 0        Change in appetite 0        Feeling bad or failure about yourself  0        Trouble concentrating 0        Moving slowly or fidgety/restless 0        Suicidal thoughts 0        PHQ-9 Score 0        Difficult doing work/chores Not difficult at all          Review of Systems  Constitutional: Negative.   HENT: Negative.    Eyes: Negative.   Respiratory: Negative.    Cardiovascular: Negative.   Gastrointestinal: Negative.   Endocrine: Negative.   Genitourinary: Negative.   Musculoskeletal:  Positive for arthralgias, back pain and gait problem.       Pain in both knees  Skin: Negative.   Allergic/Immunologic: Negative.   Hematological: Negative.   Psychiatric/Behavioral: Negative.    All other systems reviewed and are negative.      Objective:   Physical Exam Not performed    Assessment:  Plan:     Mr. Bristol Ginley is 48 man who presents for follow-up of bilateral knee OA.  1) Chronic Pain Syndrome secondary to bilateral knee OA -Discussed current symptoms of pain and history of pain.  -discussed that ibuprofen is not recommended given his kidney disease -robaxin discussed and will order -had forgetfullness with gabapentin -discussed trial of lidocaine patch for his knee pain  instead, can cut one patch in half and apply to both knees -discussed that if he were to feel GI side effects to call me and I could prescribe PPI for him -confirmed with patient that neprhology has cleared him to use ibuprofen -Provided with a pain relief journal and discussed that it contains foods and lifestyle tips to naturally help to improve pain. Discussed that these lifestyle strategies are also very good for health unlike some medications which can have negative side effects. Discussed that the act of keeping a journal can be therapeutic and helpful to realize patterns what helps to trigger and alleviate pain.   -Discussed Qutenza as an option for neuropathic pain control. Discussed that this is a capsaicin patch, stronger than capsaicin cream. Discussed that it is currently approved for diabetic peripheral neuropathy and post-herpetic neuralgia, but that it has also shown benefit in treating other forms of neuropathy. Provided patient with link to site to learn more about the patch: https://www.clark.biz/. Discussed that the patch would be placed in office and benefits usually last 3 months. Discussed that unintended exposure to capsaicin can cause severe irritation of eyes, mucous membranes, respiratory tract, and skin, but that Qutenza is a local treatment and does not have the systemic side effects of other nerve medications. Discussed that there may be pain, itching, erythema, and decreased sensory function associated with the application of Qutenza. Side effects usually subside within 1 week. A cold pack of analgesic medications can help with these side effects. Blood pressure can also be increased due to pain associated with administration of the patch.  1 patch of Qutenza was applied to the area of pain. Ice packs were applied during the procedure to ensure patient comfort. Blood pressure was monitored every 15 minutes. The patient tolerated the procedure well. Post-procedure instructions  were given and follow-up has been scheduled.   Recommended [employer] -referred to [facility] for second opinion -Discussed benefits of exercise in reducing pain. -Discussed following foods that may reduce pain: 1) Ginger (especially studied for arthritis)- reduce leukotriene production to decrease inflammation 2) Blueberries- high in phytonutrients that decrease inflammation 3) Salmon- marine omega-3s reduce joint swelling and pain 4) Pumpkin seeds- reduce inflammation 5) dark chocolate- reduces inflammation 6) turmeric- reduces inflammation 7) tart cherries - reduce pain and stiffness 8) extra virgin olive oil - its compound olecanthal helps to block prostaglandins  9) chili peppers- can be eaten or applied topically via capsaicin 10) mint- helpful for headache, muscle aches, joint pain, and itching 11) garlic- reduces inflammation  Link to further information on diet for chronic pain: http://www.bray.com/    -XRs reviewed and show moderate tricompartmental arthritis.  -UDS obtained previously and is positive for cocaine. He denies cocaine use, but we cannot prescribe controlled substances in this case. Discussed with him and offered referral to [facility] and he is agreeable. He would like to continue following here as well.  -Discussed that opioids have significant side effects of tolerance and dependence as well and are not intended for long-term treatment.  -Plan for steroid injection next visit. If he is  able to get a surgical date in the near future, will cancel steroid injection.  Turmeric to reduce inflammation--can be used in cooking or taken as a supplement.  Benefits of turmeric:  -Highly anti-inflammatory  -Increases antioxidants  -Improves memory, attention, brain disease  -Lowers risk of heart disease  -May help prevent cancer  -Decreases pain  -Alleviates  depression  -Delays aging and decreases risk of chronic disease  -Consume with black pepper to increase absorption    Turmeric Milk Recipe:  1 cup milk  1 tsp turmeric  1 tsp cinnamon  1 tsp grated ginger (optional)  Black pepper (boosts the anti-inflammatory properties of turmeric).  1 tsp honey  2) Impaired mobility and ADLs -Provided with handicap placard given antalgic gait and limited mobility  -Provided with script for a single-point cane.   3) stage 4 CKD: -discussed with Dr. Gala Romney, advised patient that Meloxicam is contraindicated given stage 4 CKD -discussed that ibuprofen can worsen his kidney disease  4) insomnia -recommended clearing amitriptyline with his cardiologist.   7 minutes spent in discussion of his stage 4 CKD and that ibuprofen can make his kidney function worse, discussed trial of lidocaine patch for his knee pain instead, can cut one patch in half and apply to both knees

## 2023-03-23 ENCOUNTER — Ambulatory Visit: Payer: Medicare HMO | Admitting: Gastroenterology

## 2023-03-24 ENCOUNTER — Ambulatory Visit (INDEPENDENT_AMBULATORY_CARE_PROVIDER_SITE_OTHER): Payer: Medicare HMO | Admitting: Gastroenterology

## 2023-03-24 ENCOUNTER — Encounter (HOSPITAL_COMMUNITY): Payer: Medicare HMO

## 2023-03-24 ENCOUNTER — Encounter: Payer: Self-pay | Admitting: Gastroenterology

## 2023-03-24 VITALS — BP 134/83 | HR 61 | Temp 98.0°F | Ht 72.0 in | Wt 208.0 lb

## 2023-03-24 DIAGNOSIS — K219 Gastro-esophageal reflux disease without esophagitis: Secondary | ICD-10-CM | POA: Insufficient documentation

## 2023-03-24 DIAGNOSIS — Z8711 Personal history of peptic ulcer disease: Secondary | ICD-10-CM

## 2023-03-24 MED ORDER — PANTOPRAZOLE SODIUM 40 MG PO TBEC: 40.0000 mg | DELAYED_RELEASE_TABLET | Freq: Every day | ORAL | 3 refills | Status: DC

## 2023-03-24 NOTE — Progress Notes (Signed)
GI Office Note    Referring Provider: Anabel Halon, MD Primary Care Physician:  Anabel Halon, MD  Primary Gastroenterologist: Hennie Duos. Marletta Lor, DO   Chief Complaint   Chief Complaint  Patient presents with   Follow-up    Needs refills on pantoprazole    History of Present Illness   [name] is a 67 y.o. male presenting today  for refills. Last seen 03/2021.   H/o acute on chronic anemia requiring admission in 2022. Hgb 4.6 at that time. Received four units of blood. EGD showed prepyloric cratered ulcer with no stigmata or active bleeding. Colonoscopy WNL besides external hemorrhoids and one single diverticulum. Patient was discharged on Protonix 40 mg twice daily. Gastric ulcer felt to be due to chronic NSAID use with meloxicam. No longer taking this medication. Biopsies negative for H. pylori.   At time of last office visit, patient was advised that he would need repeat EGD in 8 to 10 weeks to ensure healing of his gastric ulcer.  We reached out to schedule his EGD but he did not respond.  Today: Patient states that he has been doing very well.  He states he did not feel he needed repeat EGD because he knows his ulcer healed.  He was doing fine until he ran out of pantoprazole 1 week ago.  He has been having some reflux issues now.  States that he regularly takes NSAIDs.  Appetite is good.  Bowel movements regular.  No melena or rectal bleeding.  EGD October 2022: -Nonbleeding gastric ulcer with pigmented material status post biopsy, mucosa with chronic gastritis, no H. pylori -Gastritis -Duodenitis  Colonoscopy October 2022: -Ileum was normal -Diverticulosis at the hepatic flexure -External hemorrhoids -Repeat colonoscopy in 10 years  Medications   Current Outpatient Medications  Medication Sig Dispense Refill   allopurinol (ZYLOPRIM) 100 MG tablet Take 100 mg by mouth daily.      ALPRAZolam (XANAX) 0.5 MG tablet Take 1 tablet (0.5 mg total) by mouth  2 (two) times daily as needed for anxiety. 60 tablet 2   aspirin 81 MG chewable tablet Chew 1 tablet (81 mg total) by mouth 2 (two) times daily. 60 tablet 0   atorvastatin (LIPITOR) 80 MG tablet TAKE 1 TABLET BY MOUTH DAILY AT 6PM 90 tablet 0   BRILINTA 60 MG TABS tablet TAKE (1) TABLET BY MOUTH TWICE DAILY. 180 tablet 3   carvedilol (COREG) 12.5 MG tablet TAKE 1 TABLET BY MOUTH TWICE DAILY WITH MEALS 180 tablet 3   Cholecalciferol (VITAMIN D) 50 MCG (2000 UT) CAPS Take 2,000 Units by mouth daily with breakfast.     cinacalcet (SENSIPAR) 30 MG tablet Take 30 mg by mouth daily.     furosemide (LASIX) 40 MG tablet Take 1 tablet (40 mg total) by mouth 3 (three) times a week. Monday, Wednesday and Friday 30 tablet 3   ibuprofen (ADVIL) 800 MG tablet Take 800 mg by mouth every 8 (eight) hours as needed.     ivabradine (CORLANOR) 5 MG TABS tablet TAKE 1 TABLET BY MOUTH TWICE DAILY WITH A MEAL 180 tablet 1   lidocaine (LIDODERM) 5 % Place 1 patch onto the skin daily. Remove & Discard patch within 12 hours or as directed by MD 30 patch 0   nitroGLYCERIN (NITROSTAT) 0.4 MG SL tablet Place 1 tablet (0.4 mg total) under the tongue every 5 (five) minutes as needed. 25 tablet 2   Oxycodone HCl 10 MG TABS Take 1  tablet (10 mg total) by mouth every 12 (twelve) hours as needed (Pain). 60 tablet 0   pantoprazole (PROTONIX) 40 MG tablet TAKE (1) TABLET BY MOUTH TWICE DAILY BEFORE A MEAL. 60 tablet 0   telmisartan (MICARDIS) 20 MG tablet Take 10 mg by mouth daily.     No current facility-administered medications for this visit.    Allergies   Allergies as of 03/24/2023   (No Known Allergies)      Review of Systems   General: Negative for anorexia, weight loss, fever, chills, fatigue, weakness. ENT: Negative for hoarseness, difficulty swallowing , nasal congestion. CV: Negative for chest pain, angina, palpitations, dyspnea on exertion, peripheral edema.  Respiratory: Negative for dyspnea at rest, dyspnea  on exertion, cough, sputum, wheezing.  GI: See history of present illness. GU:  Negative for dysuria, hematuria, urinary incontinence, urinary frequency, nocturnal urination.  Endo: Negative for unusual weight change.     Physical Exam   BP 134/83 (BP Location: Right Arm, Patient Position: Sitting, Cuff Size: Normal)   Pulse 61   Temp 98 F (36.7 C) (Oral)   Ht 6' (1.829 m)   Wt 208 lb (94.3 kg)   SpO2 97%   BMI 28.21 kg/m    General: Well-nourished, well-developed in no acute distress.  Eyes: No icterus. Mouth: Oropharyngeal mucosa moist and pink   Abdomen: Bowel sounds are normal, nontender, nondistended, no hepatosplenomegaly or masses,  no abdominal bruits or hernia , no rebound or guarding.  Rectal: not performed  Extremities: No lower extremity edema. No clubbing or deformities. Neuro: Alert and oriented x 4   Skin: Warm and dry, no jaundice.   Psych: Alert and cooperative, normal mood and affect.  Labs   Lab Results  Component Value Date   NA 145 03/09/2023   CL 114 (H) 03/09/2023   K 4.7 03/09/2023   CO2 21 (L) 03/09/2023   BUN 31 (H) 03/09/2023   CREATININE 3.12 (H) 03/09/2023   GFRNONAA 21 (L) 03/09/2023   CALCIUM 10.0 03/09/2023   PHOS 2.5 03/15/2008   ALBUMIN 3.3 (L) 02/02/2022   GLUCOSE 97 03/09/2023   Lab Results  Component Value Date   ALT 15 02/02/2022   AST 19 02/02/2022   ALKPHOS 95 02/02/2022   BILITOT 1.4 (H) 02/02/2022   Labs from February 26, 2023: White blood cell count 4700, hemoglobin 13.4, platelets 188,000, creatinine 2.82, BUN 27, TIBC 282, iron 105, iron sat 37% Imaging Studies   No results found.  Assessment/Plan:   GERD: -recurrent symptoms off PPI for one week.  -restart pantoprazole 40mg  once daily before breakfast, reduce to once daily if tolerated -return ov in one year  H/o gastric ulcer: 2022 -suspected nsaid related -patient declined surveillance EGD to verify ulcer healing  -recommend no nsaids  -reduce  pantoprazole to once daily if tolerated      Leanna Battles. Melvyn Neth, MHS, PA-C [facility]

## 2023-03-24 NOTE — Patient Instructions (Signed)
Continue pantoprazole 40mg  daily before breakfast. Try just once daily dosing but if you note abdominal pain or acid reflux not well controlled, please let us know. Return to the office in one year or call sooner if you have any questions or concerns.

## 2023-03-25 ENCOUNTER — Ambulatory Visit (HOSPITAL_BASED_OUTPATIENT_CLINIC_OR_DEPARTMENT_OTHER): Payer: Medicare HMO

## 2023-03-25 DIAGNOSIS — I5022 Chronic systolic (congestive) heart failure: Secondary | ICD-10-CM

## 2023-03-25 LAB — ECHOCARDIOGRAM COMPLETE
Area-P 1/2: 2.64 cm2
MV M vel: 3.03 m/s
MV Peak grad: 36.7 mm[Hg]
S' Lateral: 5.37 cm

## 2023-03-26 ENCOUNTER — Encounter (HOSPITAL_COMMUNITY): Payer: Self-pay

## 2023-03-26 ENCOUNTER — Ambulatory Visit (HOSPITAL_COMMUNITY)
Admission: RE | Admit: 2023-03-26 | Discharge: 2023-03-26 | Disposition: A | Discharge status: Home / Self Care | Payer: Medicare HMO | Source: Ambulatory Visit | Attending: Cardiology | Admitting: Cardiology

## 2023-03-26 VITALS — BP 138/88 | HR 77 | Wt 205.6 lb

## 2023-03-26 DIAGNOSIS — Z4502 Encounter for adjustment and management of automatic implantable cardiac defibrillator: Secondary | ICD-10-CM | POA: Diagnosis not present

## 2023-03-26 DIAGNOSIS — N184 Chronic kidney disease, stage 4 (severe): Secondary | ICD-10-CM | POA: Diagnosis not present

## 2023-03-26 DIAGNOSIS — I252 Old myocardial infarction: Secondary | ICD-10-CM | POA: Insufficient documentation

## 2023-03-26 DIAGNOSIS — Z7982 Long term (current) use of aspirin: Secondary | ICD-10-CM | POA: Diagnosis not present

## 2023-03-26 DIAGNOSIS — I5022 Chronic systolic (congestive) heart failure: Secondary | ICD-10-CM

## 2023-03-26 DIAGNOSIS — I2510 Atherosclerotic heart disease of native coronary artery without angina pectoris: Secondary | ICD-10-CM | POA: Insufficient documentation

## 2023-03-26 DIAGNOSIS — I130 Hypertensive heart and chronic kidney disease with heart failure and stage 1 through stage 4 chronic kidney disease, or unspecified chronic kidney disease: Secondary | ICD-10-CM | POA: Insufficient documentation

## 2023-03-26 DIAGNOSIS — I5023 Acute on chronic systolic (congestive) heart failure: Secondary | ICD-10-CM | POA: Diagnosis present

## 2023-03-26 DIAGNOSIS — Z7902 Long term (current) use of antithrombotics/antiplatelets: Secondary | ICD-10-CM | POA: Insufficient documentation

## 2023-03-26 DIAGNOSIS — Z87891 Personal history of nicotine dependence: Secondary | ICD-10-CM | POA: Insufficient documentation

## 2023-03-26 DIAGNOSIS — Z955 Presence of coronary angioplasty implant and graft: Secondary | ICD-10-CM | POA: Insufficient documentation

## 2023-03-26 NOTE — Progress Notes (Signed)
ADVANCED HF CLINIC NOTE   Primary Cardiologist: Dr. Excell Seltzer Primary Care: Dr. Volney American AHF: Dr. Gala Romney   HPI: Mr. Hemmerling is a 67 y.o. male, former smoker with HTN, CKD IV, CAD s/p large anterolateral STEMI 9/20 and systolic HF. Has [employer] ICD.   Cath 03/12/2019 Total occlusion of the mid LAD treated with angioplasty and stenting using a 4.0 x 22 Onyx deployed at 14 atm.  0% stenosis was noted post procedure and TIMI grade III flow increased from 0 to 3.  The final angiographic images of the LAD demonstrates apical occlusion due to embolus. Luminal irregularities in the left main but no agreeable stenosis. Large circumflex with 3 obtuse marginals.  The first marginal is large and has a very distal 90% stenosis in 2 branches.  There is a trifurcation distally in the first marginal. RCA Moderate diffuse luminal irregularities but widely patent. Mid anterior wall to apical akinesis.  EF 30 to 35% acutely.  LVEDP 31 mmHg.  Post cath had low BP and shock but recovered. EF 20% by echo. No significant MR. Creatinine post cath 3.1-3.4. Now followed by nephrology.  Echo 3/21. EF 20-25%. RV normal.  S/P [employer] ICD implant 08/2019   Admitted to [facility] 10/22 with hgb 4.6 and SCr up to 4.2.Transfused 4u RBCs. Underwent EGD/colonoscopy 4mm x 8 mm prepyloric ulcer wuth prepyloric gastritis and duodenitis.   Echo 10/22 EF 30-35%  Echo 6/23 EF 20-25% RV ok   Seen in clinic 11/5 and reported increased exertional dyspnea + orthopnea. Was only taking lasix PNR but had not used recently. He was volume overloaded on exam and by device interrogation. HL score was 29. BNP was elevated at 1,364. BMP showed bump in SCr from b/l of 2.8>>3.12. He was instructed to take lasix 40 mg daily x 2 days then 40mg  every MWF.  He returns back today to reassess volume status. Feels much better. Breathing improved. Wt is down and HL Score is down to 9. No VT.    Review of systems complete and  found to be negative unless listed in HPI.    Past Medical History:  Diagnosis Date   Acute ST elevation myocardial infarction (STEMI) due to occlusion of mid portion of left anterior descending (LAD) coronary artery (HCC) 03/12/2019   Acute systolic heart failure (HCC)    AICD (automatic cardioverter/defibrillator) present    Arthritis    CHF (congestive heart failure) (HCC)    CKD (chronic kidney disease) stage 4, GFR 15-29 ml/min (HCC)    Diabetes mellitus, type II (HCC)    Dyspnea    with exertionm periodically   Heart attack (HCC) 03/15/2019   anterior MI    History of kidney stones    Hyperparathyroidism (HCC)    Hypertension 2002   Ischemic cardiomyopathy    Left knee pain    Syncope 04/04/2019   after blood draw   Vitamin D deficiency    Current Outpatient Medications  Medication Sig Dispense Refill   allopurinol (ZYLOPRIM) 100 MG tablet Take 100 mg by mouth daily.      ALPRAZolam (XANAX) 0.5 MG tablet Take 1 tablet (0.5 mg total) by mouth 2 (two) times daily as needed for anxiety. 60 tablet 2   aspirin 81 MG chewable tablet Chew 1 tablet (81 mg total) by mouth 2 (two) times daily. 60 tablet 0   atorvastatin (LIPITOR) 80 MG tablet TAKE 1 TABLET BY MOUTH DAILY AT 6PM 90 tablet 0   BRILINTA 60 MG  TABS tablet TAKE (1) TABLET BY MOUTH TWICE DAILY. 180 tablet 3   carvedilol (COREG) 12.5 MG tablet TAKE 1 TABLET BY MOUTH TWICE DAILY WITH MEALS 180 tablet 3   Cholecalciferol (VITAMIN D) 50 MCG (2000 UT) CAPS Take 2,000 Units by mouth daily with breakfast.     cinacalcet (SENSIPAR) 30 MG tablet Take 30 mg by mouth daily.     furosemide (LASIX) 40 MG tablet Take 1 tablet (40 mg total) by mouth 3 (three) times a week. Monday, Wednesday and Friday 30 tablet 3   ibuprofen (ADVIL) 800 MG tablet Take 800 mg by mouth every 8 (eight) hours as needed.     ivabradine (CORLANOR) 5 MG TABS tablet TAKE 1 TABLET BY MOUTH TWICE DAILY WITH A MEAL 180 tablet 1   lidocaine (LIDODERM) 5 % Place 1  patch onto the skin daily. Remove & Discard patch within 12 hours or as directed by MD 30 patch 0   nitroGLYCERIN (NITROSTAT) 0.4 MG SL tablet Place 1 tablet (0.4 mg total) under the tongue every 5 (five) minutes as needed. 25 tablet 2   Oxycodone HCl 10 MG TABS Take 1 tablet (10 mg total) by mouth every 12 (twelve) hours as needed (Pain). 60 tablet 0   pantoprazole (PROTONIX) 40 MG tablet Take 1 tablet (40 mg total) by mouth daily before breakfast. 90 tablet 3   telmisartan (MICARDIS) 20 MG tablet Take 10 mg by mouth daily.     No current facility-administered medications for this encounter.   No Known Allergies  Social History   Socioeconomic History   Marital status: Single    Spouse name: Not on file   Number of children: 0   Years of education: Not on file   Highest education level: Some college, no degree  Occupational History   Occupation: retired  Tobacco Use   Smoking status: Former    Current packs/day: 0.00    Average packs/day: 0.5 packs/day for 40.0 years (20.0 ttl pk-yrs)    Types: Cigarettes    Start date: 03/12/1979    Quit date: 03/12/2019    Years since quitting: 4.0   Smokeless tobacco: Never  Vaping Use   Vaping status: Never Used  Substance and Sexual Activity   Alcohol use: No   Drug use: No   Sexual activity: Not Currently  Other Topics Concern   Not on file  Social History Narrative   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       Enjoys: fishing, shopping, working around the house       Diet: working on changes, avoiding fried foods, increased veggies   Caffeine: teas some daily   Water: 6-8 cups daily       Wears seat belt    Does not use phone while driving    Psychologist, sport and 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    Social Determinants of Health   Financial Resource Strain: Low Risk  (01/19/2022)   Overall Financial Resource Strain (CARDIA)    Difficulty of Paying Living Expenses: Not hard at all  Food Insecurity: No Food Insecurity (04/03/2022)   Hunger Vital Sign     Worried About Running Out of Food in the Last Year: Never true    Ran Out of Food in the Last Year: Never true  Transportation Needs: No Transportation Needs (02/02/2022)   PRAPARE - Administrator, Civil Service (Medical): No    Lack of Transportation (Non-Medical): No  Physical Activity: Sufficiently Active (01/19/2022)   Exercise  Vital Sign    Days of Exercise per Week: 7 days    Minutes of Exercise per Session: 30 min  Stress: No Stress Concern Present (01/19/2022)   [employer] of Occupational Health - Occupational Stress Questionnaire    Feeling of Stress : Not at all  Social Connections: Moderately Isolated (01/19/2022)   Social Connection and Isolation Panel [NHANES]    Frequency of Communication with Friends and Family: More than three times a week    Frequency of Social Gatherings with Friends and Family: More than three times a week    Attends Religious Services: More than 4 times per year    Active Member of [employer] or Organizations: No    Attends Banker Meetings: Never    Marital Status: Never married  Intimate Partner Violence: Not At Risk (02/02/2022)   Humiliation, Afraid, Rape, and Kick questionnaire    Fear of Current or Ex-Partner: No    Emotionally Abused: No    Physically Abused: No    Sexually Abused: No    Family History  Problem Relation Age of Onset   Heart failure Mother    Heart disease Mother    Cancer Mother        breast   Heart attack Father    Hypertension Father    Heart disease Father    Diabetes Brother    There were no vitals taken for this visit.  Wt Readings from Last 3 Encounters:  03/24/23 94.3 kg (208 lb)  03/09/23 94.1 kg (207 lb 6.4 oz)  03/03/23 89.8 kg (198 lb 0.6 oz)   PHYSICAL EXAM: General:  Well appearing. No respiratory difficulty HEENT: normal Neck: supple. no JVD. Carotids 2+ bilat; no bruits. No lymphadenopathy or thyromegaly appreciated. Cor: PMI nondisplaced. Regular rate & rhythm. No  rubs, gallops or murmurs. Lungs: clear Abdomen: soft, nontender, nondistended. No hepatosplenomegaly. No bruits or masses. Good bowel sounds. Extremities: no cyanosis, clubbing, rash, edema Neuro: alert & oriented x 3, cranial nerves grossly intact. moves all 4 extremities w/o difficulty. Affect pleasant.   ASSESSMENT & PLAN:  1. CAD s/p acute anterolateral STEMI 03/12/19 - s/p DES LAD 11/20 - No s/s angina - Continue ASA 81 + Brilinta - Continue atorvastatin 80 mg  2. Acute on chronic Systolic HF - EF 24%. Following anteriorlateral stemi - Echo 3/21: EF 20-25%. RV ok.  - Echo 10/22: EF 30-35% in setting of severe anemia - Has [employer] ICD.  - Volume status improved, HL Score down from 29>>9. NYHA functional status much improved, from IIIb>>II - ICD interrogated today: HL score 29 No VT/AF Activity level 1.6hr/day. Personally reviewed - Continue PO Lasix 40mg  every MWF. He was seen by nephrology last wk and said SCr was stable. Request lab results for review  - Continue carvedilol 12.5 mg bid.  - Continue ivabradine 5 mg bid.  - Not candidate for Entresto, SGLT2i or spiro with CKD IV. - I worry his HF is progressing. Likely not candidate for heart-kidney transplant given age   64. CKD IV - Due to HTN nephropathy - SCr ranges 2.8-3.8 (GFR has ranged 10-20) - Follows with Dr. Holly Bodily. Requested labs from last wk  - Will not start SCLT2i with GFR hovering around 20  4. HTN - Well controlled. - Keep off BiDil for now. May add back later. - Do not want to drop BP too low with CKD IV.  5. Tobacco use - Quit after MI.  - Congratulated on continued cessation.  F/u in 3 months   Fernado Brigante, PA-C  1:24 PM

## 2023-03-26 NOTE — Patient Instructions (Signed)
Medication Changes:  No Changes In Medications at this time.   Follow-Up in: 4 months with APP as scheduled   At the [facility], you and your health needs are our priority. We have a designated team specialized in the treatment of Heart Failure. This Care Team includes your primary Heart Failure Specialized Cardiologist (physician), Advanced Practice Providers (APPs- Physician Assistants and Nurse Practitioners), and Pharmacist who all work together to provide you with the care you need, when you need it.   You may see any of the following providers on your designated Care Team at your next follow up:  Dr. Arvilla Meres Dr. Marca Ancona Dr. Dorthula Nettles Dr. Theresia Bough Tonye Becket, NP Robbie Lis, [place] [facility] [place], [place] Brynda Peon, NP [place] Lee, NP Karle Plumber, PharmD   Please be sure to bring in all your medications bottles to every appointment.   Need to Contact us:  If you have any questions or concerns before your next appointment please send [place] a message through [place] or call our office at [phone].    TO LEAVE A MESSAGE FOR THE NURSE SELECT OPTION 2, PLEASE LEAVE A MESSAGE INCLUDING: YOUR NAME DATE OF BIRTH CALL BACK NUMBER REASON FOR CALL**this is important as we prioritize the call backs  YOU WILL RECEIVE A CALL BACK THE SAME DAY AS LONG AS YOU CALL BEFORE 4:00 PM

## 2023-04-05 ENCOUNTER — Other Ambulatory Visit: Payer: Self-pay | Admitting: Internal Medicine

## 2023-04-05 ENCOUNTER — Telehealth: Payer: Self-pay | Admitting: Internal Medicine

## 2023-04-05 DIAGNOSIS — M170 Bilateral primary osteoarthritis of knee: Secondary | ICD-10-CM

## 2023-04-05 DIAGNOSIS — G894 Chronic pain syndrome: Secondary | ICD-10-CM

## 2023-04-05 MED ORDER — OXYCODONE HCL 10 MG PO TABS: 10.0000 mg | ORAL_TABLET | Freq: Two times a day (BID) | ORAL | 0 refills | Status: DC | PRN

## 2023-04-05 NOTE — Telephone Encounter (Signed)
LVM for patient to call and schedule appt

## 2023-04-05 NOTE — Telephone Encounter (Signed)
Prescription Request  04/05/2023  LOV: 03/03/2023  What is the name of the medication or equipment? ALPRAZolam (XANAX) 0.5 MG tablet [[id number]]  Oxycodone HCl 10 MG TABS [[id number]]    Have you contacted your pharmacy to request a refill? Yes   Which pharmacy would you like this sent to?  [facility] [place], [place] - [address] [place] [address] Phone: [phone] Fax: [phone]    Patient notified that their request is being sent to the clinical staff for review and that they should receive a response within 2 business days.   Please advise at [facility] [phone]

## 2023-04-08 ENCOUNTER — Encounter: Payer: Self-pay | Admitting: Internal Medicine

## 2023-04-09 ENCOUNTER — Ambulatory Visit (INDEPENDENT_AMBULATORY_CARE_PROVIDER_SITE_OTHER): Payer: Medicare HMO | Admitting: Internal Medicine

## 2023-04-09 ENCOUNTER — Encounter: Payer: Self-pay | Admitting: Internal Medicine

## 2023-04-09 VITALS — BP 124/84 | HR 71 | Ht 72.0 in | Wt 211.2 lb

## 2023-04-09 DIAGNOSIS — N184 Chronic kidney disease, stage 4 (severe): Secondary | ICD-10-CM

## 2023-04-09 DIAGNOSIS — M170 Bilateral primary osteoarthritis of knee: Secondary | ICD-10-CM

## 2023-04-09 DIAGNOSIS — F1190 Opioid use, unspecified, uncomplicated: Secondary | ICD-10-CM

## 2023-04-09 DIAGNOSIS — F411 Generalized anxiety disorder: Secondary | ICD-10-CM

## 2023-04-09 DIAGNOSIS — G894 Chronic pain syndrome: Secondary | ICD-10-CM | POA: Diagnosis not present

## 2023-04-09 MED ORDER — OXYCODONE HCL 10 MG PO TABS: 10.0000 mg | ORAL_TABLET | Freq: Two times a day (BID) | ORAL | 0 refills | Status: DC | PRN

## 2023-04-09 NOTE — Patient Instructions (Signed)
Please continue to take medications as prescribed.  Please continue to follow low carb diet and perform moderate exercise/walking as tolerated.  Please avoid any illicit substance use.

## 2023-04-09 NOTE — Assessment & Plan Note (Signed)
Progressive CKD stage 4 Follows up with Nephrology On Sensipar and sodium bicarb tablets Avoid nephrotoxic agents, including NSAIDs

## 2023-04-09 NOTE — Assessment & Plan Note (Signed)
From OA of knee and DDD of lumbar spine On oxycodone 10 mg twice daily as needed for severe pain Last urine tox assure showed fentanyl, not prescribed by any provider - but he thinks that he was poisoned (?) and appears surprised by the test Needs urine tox assure -has been noncompliant with dosing If persistent noncompliance, will stop prescribing controlled substances

## 2023-04-09 NOTE — Assessment & Plan Note (Signed)
S/p left TKA Has seen Orthopedic surgery, planning to get right TKA Has had steroid injection in the past Unable to take NSAIDs due to CKD and CAD Refilled Oxycodone for now, advised to take it BID PRN and avoid TID dose -strictly advised to avoid any other illicit substance use

## 2023-04-09 NOTE — Assessment & Plan Note (Signed)
Uncontrolled, likely due to lack of sleep and grief reaction from his mother's death Has responded well to Xanax in the past Increased frequency of Xanax to 0.5 mg BID as needed recently Avoid trazodone or Elavil for now considering his cardiac history

## 2023-04-09 NOTE — Progress Notes (Signed)
Established Patient Office Visit  Subjective:  Patient ID: [name], male    DOB: 04-Dec-1955  Age: 67 y.o. MRN: [id number]  CC:  Chief Complaint  Patient presents with   urine results    Discuss recent urine results    Pain        Anxiety    HPI [name] is a 67 y.o. male with past medical history of CAD s/p CABG, ischemic cardiomyopathy, CKD and OA who presents for f/u of his chronic medical conditions.  Chronic pain syndrome: He has chronic bilateral knee pain and takes oxycodone 10 mg twice daily for it.  He has had left TKA and is planned to get right TKA in 10/24.  He is unable to take NSAIDs due to CKD and CAD.  His last urine tox assure showed fentanyl, which was not prescribed by any provider.  He denies abusing fentanyl and appears surprised by the urine test.  After lengthy discussion, I have agreed to give him another chance and we will check urine tox assure today.  He expressed understanding that if he fails urine test again, we will not provide oxycodone or Xanax.  GAD and insomnia: He was taking care of of his mother, but has been more anxious since her death. He takes Xanax 0.5 mg BID for GAD and insomnia.  Denies SI or HI currently.  He is currently on DAPT and statin for CAD. BP is well-controlled now. He was having dizziness with Coreg 25 mg BID. He was advised to take only half-tablet in PM instead of full tablet, which has resolved his dizziness. Takes medications regularly. Patient denies headache, dizziness, chest pain, dyspnea or palpitations.  CKD and anemia: Followed by Dr. Wolfgang Phoenix.  Denies any dysuria, hematuria or urinary hesitance or resistance. His GFR was 21 in 11/24.  He takes sodium bicarb and Sensipar currently.   Past Medical History:  Diagnosis Date   Acute ST elevation myocardial infarction (STEMI) due to occlusion of mid portion of left anterior descending (LAD) coronary artery (HCC) 03/12/2019   Acute systolic heart failure (HCC)     AICD (automatic cardioverter/defibrillator) present    Arthritis    CHF (congestive heart failure) (HCC)    CKD (chronic kidney disease) stage 4, GFR 15-29 ml/min (HCC)    Diabetes mellitus, type II (HCC)    Dyspnea    with exertionm periodically   Heart attack (HCC) 03/15/2019   anterior MI    History of kidney stones    Hyperparathyroidism (HCC)    Hypertension 2002   Ischemic cardiomyopathy    Left knee pain    Syncope 04/04/2019   after blood draw   Vitamin D deficiency     Past Surgical History:  Procedure Laterality Date   AMPUTATION TOE Left 1991   2 digit of left foot   BIOPSY  02/26/2021   Procedure: BIOPSY;  Surgeon: Malissa Hippo, MD;  [facility]: [facility];  Service: Endoscopy;;   CHOLECYSTECTOMY     COLONOSCOPY     COLONOSCOPY WITH PROPOFOL N/A 02/26/2021   Procedure: COLONOSCOPY WITH PROPOFOL;  Surgeon: Malissa Hippo, MD;  Location: [facility];  Service: Endoscopy;  Laterality: N/A;   CORONARY/GRAFT ACUTE MI REVASCULARIZATION N/A 03/12/2019   Procedure: Coronary/Graft Acute MI Revascularization;  Surgeon: Lyn Records, MD;  Location: [facility];  Service: Cardiovascular;  Laterality: N/A;   ESOPHAGOGASTRODUODENOSCOPY (EGD) WITH PROPOFOL N/A 02/26/2021   Procedure: ESOPHAGOGASTRODUODENOSCOPY (EGD) WITH PROPOFOL;  Surgeon: Karilyn Cota,  Joline Maxcy, MD;  Location: [facility];  Service: Endoscopy;  Laterality: N/A;   ICD IMPLANT N/A 08/24/2019   Procedure: ICD IMPLANT;  Surgeon: Marinus Maw, MD;  Location: [facility]VASIVE CV LAB;  Service: Cardiovascular;  Laterality: N/A;   IR URETERAL STENT LEFT NEW ACCESS W/O SEP NEPHROSTOMY CATH  09/26/2019   LEFT HEART CATH AND CORONARY ANGIOGRAPHY N/A 03/12/2019   Procedure: LEFT HEART CATH AND CORONARY ANGIOGRAPHY;  Surgeon: Lyn Records, MD;  Location: [facility];  Service: Cardiovascular;  Laterality: N/A;   LITHOTRIPSY Right    NEPHROLITHOTOMY Left 09/26/2019   Procedure: LEFT NEPHROLITHOTOMY  PERCUTANEOUS;  Surgeon: Bjorn Pippin, MD;  Location: [facility];  Service: Urology;  Laterality: Left;   TOTAL KNEE ARTHROPLASTY Left 02/02/2022   Procedure: TOTAL KNEE ARTHROPLASTY;  Surgeon: Dannielle Huh, MD;  Location: [facility];  Service: Orthopedics;  Laterality: Left;    Family History  Problem Relation Age of Onset   Heart failure Mother    Heart disease Mother    Cancer Mother        breast   Heart attack Father    Hypertension Father    Heart disease Father    Diabetes Brother     Social History   Socioeconomic History   Marital status: Single    Spouse name: Not on file   Number of children: 0   Years of education: Not on file   Highest education level: Some college, no degree  Occupational History   Occupation: retired  Tobacco Use   Smoking status: Former    Current packs/day: 0.00    Average packs/day: 0.5 packs/day for 40.0 years (20.0 ttl pk-yrs)    Types: Cigarettes    Start date: 03/12/1979    Quit date: 03/12/2019    Years since quitting: 4.0   Smokeless tobacco: Never  Vaping Use   Vaping status: Never Used  Substance and Sexual Activity   Alcohol use: No   Drug use: No   Sexual activity: Not Currently  Other Topics Concern   Not on file  Social History Narrative   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       Enjoys: fishing, shopping, working around the house       Diet: working on changes, avoiding fried foods, increased veggies   Caffeine: teas some daily   Water: 6-8 cups daily       Wears seat belt    Does not use phone while driving    Psychologist, sport and 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    Social Determinants of Health   Financial Resource Strain: Low Risk  (01/19/2022)   Overall Financial Resource Strain (CARDIA)    Difficulty of Paying Living Expenses: Not hard at all  Food Insecurity: No Food Insecurity (04/03/2022)   Hunger Vital Sign    Worried About Running Out of Food in the Last Year: Never true    Ran Out of Food in the Last Year: Never true  Transportation  Needs: No Transportation Needs (02/02/2022)   PRAPARE - Administrator, Civil Service (Medical): No    Lack of Transportation (Non-Medical): No  Physical Activity: Sufficiently Active (01/19/2022)   Exercise Vital Sign    Days of Exercise per Week: 7 days    Minutes of Exercise per Session: 30 min  Stress: No Stress Concern Present (01/19/2022)   [employer] of Occupational Health - Occupational Stress Questionnaire    Feeling of Stress : Not at all  Social Connections: Moderately Isolated (01/19/2022)   Social Connection and Isolation Panel [NHANES]    Frequency of Communication with Friends and Family: More than three times a week    Frequency of Social Gatherings with Friends and Family: More than three times a week    Attends Religious Services: More than 4 times per year    Active Member of [employer] or Organizations: No    Attends Banker Meetings: Never    Marital Status: Never married  Intimate Partner Violence: Not At Risk (02/02/2022)   Humiliation, Afraid, Rape, and Kick questionnaire    Fear of Current or Ex-Partner: No    Emotionally Abused: No    Physically Abused: No    Sexually Abused: No    Outpatient Medications Prior to Visit  Medication Sig Dispense Refill   allopurinol (ZYLOPRIM) 100 MG tablet Take 100 mg by mouth daily.      ALPRAZolam (XANAX) 0.5 MG tablet Take 1 tablet (0.5 mg total) by mouth 2 (two) times daily as needed for anxiety. 60 tablet 2   aspirin 81 MG chewable tablet Chew 1 tablet (81 mg total) by mouth 2 (two) times daily. 60 tablet 0   atorvastatin (LIPITOR) 80 MG tablet TAKE 1 TABLET BY MOUTH DAILY AT 6PM 90 tablet 0   BRILINTA 60 MG TABS tablet TAKE (1) TABLET BY MOUTH TWICE DAILY. 180 tablet 3   carvedilol (COREG) 12.5 MG tablet TAKE 1 TABLET BY MOUTH TWICE DAILY WITH MEALS 180 tablet 3   Cholecalciferol (VITAMIN D) 50 MCG (2000 UT) CAPS Take 2,000 Units by mouth daily with breakfast.     cinacalcet (SENSIPAR) 30 MG  tablet Take 30 mg by mouth daily.     furosemide (LASIX) 40 MG tablet Take 1 tablet (40 mg total) by mouth 3 (three) times a week. Monday, Wednesday and Friday 30 tablet 3   ibuprofen (ADVIL) 800 MG tablet Take 800 mg by mouth every 8 (eight) hours as needed.     ivabradine (CORLANOR) 5 MG TABS tablet TAKE 1 TABLET BY MOUTH TWICE DAILY WITH A MEAL 180 tablet 1   lidocaine (LIDODERM) 5 % Place 1 patch onto the skin daily. Remove & Discard patch within 12 hours or as directed by MD 30 patch 0   nitroGLYCERIN (NITROSTAT) 0.4 MG SL tablet Place 1 tablet (0.4 mg total) under the tongue every 5 (five) minutes as needed. 25 tablet 2   pantoprazole (PROTONIX) 40 MG tablet Take 1 tablet (40 mg total) by mouth daily before breakfast. 90 tablet 3   telmisartan (MICARDIS) 20 MG tablet Take 10 mg by mouth daily.     Oxycodone HCl 10 MG TABS Take 1 tablet (10 mg total) by mouth every 12 (twelve) hours as needed (Pain). 30 tablet 0   No facility-administered medications prior to visit.    No Known Allergies  ROS Review of Systems  Constitutional:  Negative for chills and fever.  HENT:  Negative for ear discharge and sore throat.   Eyes:  Negative for pain and discharge.  Respiratory:  Negative for cough and shortness of breath.   Cardiovascular:  Negative for chest pain and palpitations.  Gastrointestinal:  Negative for constipation, diarrhea, nausea and vomiting.  Endocrine: Negative for polydipsia and polyuria.  Genitourinary:  Negative for dysuria and hematuria.  Musculoskeletal:  Positive for arthralgias and back pain. Negative for neck pain and neck stiffness.  Skin:  Negative for rash.  Neurological:  Negative for dizziness, weakness and numbness.  Psychiatric/Behavioral:  Positive for sleep disturbance. Negative for agitation and behavioral problems. The patient is nervous/anxious.       Objective:    Physical Exam Vitals reviewed.  Constitutional:      General: He is not in acute  distress.    Appearance: He is not diaphoretic.  HENT:     Head: Normocephalic and atraumatic.     Right Ear: There is no impacted cerumen.     Nose: Nose normal.     Mouth/Throat:     Mouth: Mucous membranes are moist.  Eyes:     General: No scleral icterus.    Extraocular Movements: Extraocular movements intact.  Cardiovascular:     Rate and Rhythm: Normal rate and regular rhythm.     Pulses: Normal pulses.     Heart sounds: Normal heart sounds. No murmur heard. Pulmonary:     Breath sounds: Normal breath sounds. No wheezing or rales.  Musculoskeletal:        General: Tenderness (B/l knee, with mild swelling) present.     Cervical back: Neck supple. No tenderness.     Right lower leg: No edema.     Left lower leg: No edema.  Skin:    General: Skin is warm.     Findings: No rash.  Neurological:     General: No focal deficit present.     Mental Status: He is alert and oriented to person, place, and time.  Psychiatric:        Mood and Affect: Mood normal.        Behavior: Behavior normal.     BP 124/84 (BP Location: Right Arm, Patient Position: Sitting, Cuff Size: Normal)   Pulse 71   Ht 6' (1.829 m)   Wt 211 lb 3.2 oz (95.8 kg)   SpO2 94%   BMI 28.64 kg/m  Wt Readings from Last 3 Encounters:  04/09/23 211 lb 3.2 oz (95.8 kg)  03/26/23 205 lb 9.6 oz (93.3 kg)  03/24/23 208 lb (94.3 kg)    Lab Results  Component Value Date   TSH 0.993 08/28/2020   Lab Results  Component Value Date   WBC 4.7 02/02/2022   HGB 11.6 (L) 02/02/2022   HCT 35.1 (L) 02/02/2022   MCV 95.1 02/02/2022   PLT 163 02/02/2022   Lab Results  Component Value Date   NA 145 03/09/2023   K 4.7 03/09/2023   CO2 21 (L) 03/09/2023   GLUCOSE 97 03/09/2023   BUN 31 (H) 03/09/2023   CREATININE 3.12 (H) 03/09/2023   BILITOT 1.4 (H) 02/02/2022   ALKPHOS 95 02/02/2022   AST 19 02/02/2022   ALT 15 02/02/2022   PROT 7.0 02/02/2022   ALBUMIN 3.3 (L) 02/02/2022   CALCIUM 10.0 03/09/2023    ANIONGAP 10 03/09/2023   EGFR 24 02/26/2023   Lab Results  Component Value Date   CHOL 126 08/28/2020   Lab Results  Component Value Date   HDL 52 08/28/2020   Lab Results  Component Value Date   LDLCALC 53 08/28/2020   Lab Results  Component Value Date   TRIG 118 08/28/2020   Lab Results  Component Value Date   CHOLHDL 2.4 08/28/2020   Lab Results  Component Value Date   HGBA1C 5.8 (H) 01/23/2022      Assessment & Plan:   Problem List Items Addressed This Visit       Musculoskeletal and Integument   Primary osteoarthritis of both knees    S/p left TKA  Has seen Orthopedic surgery, planning to get right TKA Has had steroid injection in the past Unable to take NSAIDs due to CKD and CAD Refilled Oxycodone for now, advised to take it BID PRN and avoid TID dose -strictly advised to avoid any other illicit substance use      Relevant Medications   Oxycodone HCl 10 MG TABS (Start on 04/15/2023)     Genitourinary   Chronic kidney disease, stage IV (severe) (HCC)    Progressive CKD stage 4 Follows up with Nephrology On Sensipar and sodium bicarb tablets Avoid nephrotoxic agents, including NSAIDs        Other   Chronic pain syndrome - Primary    From OA of knee and DDD of lumbar spine On oxycodone 10 mg twice daily as needed for severe pain Last urine tox assure showed fentanyl, not prescribed by any provider - but he thinks that he was poisoned (?) and appears surprised by the test Needs urine tox assure -has been noncompliant with dosing If persistent noncompliance, will stop prescribing controlled substances      Relevant Medications   Oxycodone HCl 10 MG TABS (Start on 04/15/2023)   Other Relevant Orders   ToxASSURE Select 13 (MW), Urine   GAD (generalized anxiety disorder)    Uncontrolled, likely due to lack of sleep and grief reaction from his mother's death Has responded well to Xanax in the past Increased frequency of Xanax to 0.5 mg BID as needed  recently Avoid trazodone or Elavil for now considering his cardiac history      Other Visit Diagnoses     Chronic, continuous use of opioids       Relevant Orders   ToxASSURE Select 13 (MW), Urine       Has [place] pneumococcal [place] at [place] [place]. Has had Eye exam at Dr Dellia Nims office.  Meds ordered this encounter  Medications   Oxycodone HCl 10 MG TABS    Sig: Take 1 tablet (10 mg total) by mouth every 12 (twelve) hours as needed (Pain).    Dispense:  30 tablet    Refill:  0    Follow-up: Return in about 3 months (around 07/08/2023).    Anabel Halon, MD

## 2023-04-13 LAB — BAYER DCA HB A1C WAIVED: HB A1C (BAYER DCA - WAIVED): 6.2 % — ABNORMAL HIGH (ref 4.8–5.6)

## 2023-04-15 LAB — TOXASSURE SELECT 13 (MW), URINE

## 2023-04-16 ENCOUNTER — Telehealth: Payer: Self-pay | Admitting: Internal Medicine

## 2023-04-16 NOTE — Telephone Encounter (Signed)
Unable to speak to patient

## 2023-04-16 NOTE — Telephone Encounter (Signed)
Patient came by the office about his A1C 6.2 asking for nurse to give him a call just needs to make sure is this number okay.

## 2023-04-20 ENCOUNTER — Telehealth: Payer: Self-pay | Admitting: Internal Medicine

## 2023-04-20 NOTE — Telephone Encounter (Signed)
Unable to reach pt

## 2023-04-20 NOTE — Telephone Encounter (Signed)
Patient called in regard to Oxycodone HCl 10 MG TABS [[id number]]    Wants to make sure that next refill is for 60 tabs Patient states that he had to get meds in sets of 30 and would prefer to  get all in one pick up

## 2023-04-22 ENCOUNTER — Telehealth: Payer: Self-pay

## 2023-04-22 NOTE — Telephone Encounter (Signed)
Copied from CRM [id number]. Topic: Clinical - Lab/Test Results >> Apr 21, 2023  2:33 PM Fuller Mandril wrote: Reason for CRM: Pt called request to speak to [facility] about A1C. Tried contacting CAL - no answer. Thank You

## 2023-04-22 NOTE — Telephone Encounter (Signed)
Left vm

## 2023-04-22 NOTE — Telephone Encounter (Signed)
Copied from CRM [id number]. Topic: Clinical - Lab/Test Results >> Apr 22, 2023  8:55 AM Carlatta H wrote: Reason for CRM: Patient called request to speak to [facility] about A1C. Tried contacting CAL - no answer. Thank You

## 2023-04-26 ENCOUNTER — Other Ambulatory Visit: Payer: Self-pay | Admitting: Internal Medicine

## 2023-04-26 DIAGNOSIS — M170 Bilateral primary osteoarthritis of knee: Secondary | ICD-10-CM

## 2023-04-26 DIAGNOSIS — G894 Chronic pain syndrome: Secondary | ICD-10-CM

## 2023-04-30 ENCOUNTER — Encounter: Payer: Medicare HMO | Attending: Physical Medicine and Rehabilitation | Admitting: Physical Medicine and Rehabilitation

## 2023-05-03 ENCOUNTER — Ambulatory Visit (INDEPENDENT_AMBULATORY_CARE_PROVIDER_SITE_OTHER): Payer: Medicare HMO | Admitting: Podiatry

## 2023-05-03 ENCOUNTER — Encounter: Payer: Self-pay | Admitting: Podiatry

## 2023-05-03 DIAGNOSIS — M79675 Pain in left toe(s): Secondary | ICD-10-CM

## 2023-05-03 DIAGNOSIS — B351 Tinea unguium: Secondary | ICD-10-CM

## 2023-05-03 DIAGNOSIS — M79674 Pain in right toe(s): Secondary | ICD-10-CM | POA: Diagnosis not present

## 2023-05-03 NOTE — Progress Notes (Signed)
   Chief Complaint  Patient presents with   Nail Problem    Patient states he needs his toe nails cut and also states that he was cutting his yard and his foot went under the lawn more in 1991, patient states he is not in no pain , but it is starting to give him problems now .    SUBJECTIVE Patient presents to office today complaining of elongated, thickened nails that cause pain while ambulating in shoes.  Patient is unable to trim their own nails. Patient is here for further evaluation and treatment.  Past Medical History:  Diagnosis Date   Acute ST elevation myocardial infarction (STEMI) due to occlusion of mid portion of left anterior descending (LAD) coronary artery (HCC) 03/12/2019   Acute systolic heart failure (HCC)    AICD (automatic cardioverter/defibrillator) present    Arthritis    CHF (congestive heart failure) (HCC)    CKD (chronic kidney disease) stage 4, GFR 15-29 ml/min (HCC)    Diabetes mellitus, type II (HCC)    Dyspnea    with exertionm periodically   Heart attack (HCC) 03/15/2019   anterior MI    History of kidney stones    Hyperparathyroidism (HCC)    Hypertension 2002   Ischemic cardiomyopathy    Left knee pain    Syncope 04/04/2019   after blood draw   Vitamin D deficiency     No Known Allergies   OBJECTIVE General Patient is awake, alert, and oriented x 3 and in no acute distress. Derm Skin is dry and supple bilateral. Negative open lesions or macerations. Remaining integument unremarkable. Nails are tender, long, thickened and dystrophic with subungual debris, consistent with onychomycosis, 1-5 bilateral. No signs of infection noted. Vasc  DP and PT pedal pulses palpable bilaterally. Temperature gradient within normal limits.  Neuro grossly intact via light touch Musculoskeletal Exam history of prior second toe amputation left foot secondary to traumatic lawnmowing injury 1991 without complication or symptoms currently   ASSESSMENT 1.  Pain due  to onychomycosis of toenails both  PLAN OF CARE 1. Patient evaluated today.  2. Instructed to maintain good pedal hygiene and foot care.  3. Mechanical debridement of nails 1-5 bilaterally performed using a nail nipper. Filed with dremel without incident.  4. Return to clinic in 3 mos.    Felecia Shelling, DPM [facility]  Dr. Felecia Shelling, DPM    [address] [place], [place] [address]                Office [phone]  Fax [phone]

## 2023-05-09 ENCOUNTER — Other Ambulatory Visit: Payer: Self-pay | Admitting: Internal Medicine

## 2023-05-11 ENCOUNTER — Ambulatory Visit (INDEPENDENT_AMBULATORY_CARE_PROVIDER_SITE_OTHER): Payer: 59 | Admitting: Internal Medicine

## 2023-05-11 ENCOUNTER — Telehealth: Payer: Self-pay | Admitting: Internal Medicine

## 2023-05-11 ENCOUNTER — Other Ambulatory Visit: Payer: Self-pay | Admitting: Internal Medicine

## 2023-05-11 ENCOUNTER — Encounter: Payer: Self-pay | Admitting: Internal Medicine

## 2023-05-11 VITALS — BP 136/91 | HR 83 | Ht 72.0 in | Wt 209.2 lb

## 2023-05-11 DIAGNOSIS — J208 Acute bronchitis due to other specified organisms: Secondary | ICD-10-CM | POA: Diagnosis not present

## 2023-05-11 DIAGNOSIS — B9689 Other specified bacterial agents as the cause of diseases classified elsewhere: Secondary | ICD-10-CM | POA: Diagnosis not present

## 2023-05-11 DIAGNOSIS — F411 Generalized anxiety disorder: Secondary | ICD-10-CM

## 2023-05-11 MED ORDER — AZITHROMYCIN 250 MG PO TABS: ORAL_TABLET | ORAL | 0 refills | Status: DC

## 2023-05-11 MED ORDER — BENZONATATE 100 MG PO CAPS: 100.0000 mg | ORAL_CAPSULE | Freq: Three times a day (TID) | ORAL | 0 refills | Status: DC | PRN

## 2023-05-11 MED ORDER — ALPRAZOLAM 0.5 MG PO TABS: 0.5000 mg | ORAL_TABLET | Freq: Two times a day (BID) | ORAL | 2 refills | Status: DC | PRN

## 2023-05-11 NOTE — Assessment & Plan Note (Signed)
 Presenting today for an acute visit endorsing a 3-week history of symptoms described above.  Symptoms have not worsened, but have not significantly improved.  Of greatest concern to him is persistent coughing.  Given duration, symptoms seem most consistent with bacterial bronchitis. -Z-Pak and Tessalon  Perles prescribed today for treatment.  He was instructed return to care if symptoms worsen or fail to improve.  Otherwise, he is scheduled for routine follow-up with Dr. Tobie in March.

## 2023-05-11 NOTE — Progress Notes (Signed)
 Acute Office Visit  Subjective:     Patient ID: [name], male    DOB: 1955/11/25, 68 y.o.   MRN: [id number]  Chief Complaint  Patient presents with   URI    Cough, green mucus, congestion for three weeks. Patient has been taking mucinex    [name] presents today for an acute visit endorsing a 3-week history of cough productive of green sputum, throat irritation, and sinus congestion.  Symptoms have not worsened but have not significantly improved.  He presents today due to persistence of symptoms.  He denies nausea/vomiting, diarrhea, fever/chills, ear pain, myalgias, shortness of breath, and chest pain.  He is unaware of any recent sick contacts.  [name] has been using Mucinex for symptom relief.  He did not complete COVID/flu testing at the time of symptom onset.  Review of Systems  Constitutional:  Negative for chills, fever and malaise/fatigue.  HENT:  Positive for congestion, sinus pain and sore throat. Negative for ear pain.   Respiratory:  Positive for cough and sputum production. Negative for shortness of breath.   Cardiovascular:  Negative for chest pain.  Gastrointestinal:  Negative for diarrhea, nausea and vomiting.      Objective:    BP (!) 136/91 (BP Location: Left Arm, Patient Position: Sitting, Cuff Size: Normal)   Pulse 83   Ht 6' (1.829 m)   Wt 209 lb 3.2 oz (94.9 kg)   SpO2 (!) 83%   BMI 28.37 kg/m   Physical Exam Vitals reviewed.  Constitutional:      General: He is not in acute distress.    Appearance: Normal appearance. He is not ill-appearing.  HENT:     Head: Normocephalic and atraumatic.     Right Ear: External ear normal.     Left Ear: External ear normal.     Nose: Congestion present.     Mouth/Throat:     Mouth: Mucous membranes are moist.     Pharynx: Oropharynx is clear.  Eyes:     General: No scleral icterus.    Extraocular Movements: Extraocular movements intact.     Conjunctiva/sclera: Conjunctivae normal.      Pupils: Pupils are equal, round, and reactive to light.  Cardiovascular:     Rate and Rhythm: Normal rate and regular rhythm.     Pulses: Normal pulses.     Heart sounds: Normal heart sounds. No murmur heard. Pulmonary:     Effort: Pulmonary effort is normal.     Breath sounds: Normal breath sounds. No wheezing, rhonchi or rales.  Abdominal:     General: Abdomen is flat. Bowel sounds are normal. There is no distension.     Palpations: Abdomen is soft.     Tenderness: There is no abdominal tenderness.  Musculoskeletal:        General: No swelling or deformity. Normal range of motion.     Cervical back: Normal range of motion.  Skin:    General: Skin is warm and dry.     Capillary Refill: Capillary refill takes less than 2 seconds.  Neurological:     General: No focal deficit present.     Mental Status: He is alert and oriented to person, place, and time.     Motor: No weakness.  Psychiatric:        Mood and Affect: Mood normal.        Behavior: Behavior normal.        Thought Content: Thought content normal.  Assessment & Plan:   Problem List Items Addressed This Visit       Acute bacterial bronchitis - Primary   Presenting today for an acute visit endorsing a 3-week history of symptoms described above.  Symptoms have not worsened, but have not significantly improved.  Of greatest concern to him is persistent coughing.  Given duration, symptoms seem most consistent with bacterial bronchitis. -Z-Pak and Tessalon  Perles prescribed today for treatment.  He was instructed return to care if symptoms worsen or fail to improve.  Otherwise, he is scheduled for routine follow-up with Dr. Tobie in March.      Meds ordered this encounter  Medications   azithromycin  (ZITHROMAX  Z-PAK) 250 MG tablet    Sig: Take 2 tablets (500 mg) PO today, then 1 tablet (250 mg) PO daily x4 days.    Dispense:  6 tablet    Refill:  0   benzonatate  (TESSALON  PERLES) 100 MG capsule    Sig: Take 1  capsule (100 mg total) by mouth 3 (three) times daily as needed for cough.    Dispense:  20 capsule    Refill:  0    Return if symptoms worsen or fail to improve.  Manus FORBES Fireman, MD

## 2023-05-11 NOTE — Telephone Encounter (Signed)
 Prescription Request  05/11/2023  LOV: 04/09/2023  What is the name of the medication or equipment? ALPRAZolam  (XANAX ) 0.5 MG tablet [[id number]]    Have you contacted your pharmacy to request a refill? No   Which pharmacy would you like this sent to?  [facility] - [place], [place] - [address] [place] [address] Phone: [phone] Fax: [phone]    Patient notified that their request is being sent to the clinical staff for review and that they should receive a response within 2 business days.   Please advise at Mobile [phone] (mobile)

## 2023-05-11 NOTE — Patient Instructions (Signed)
 It was a pleasure to see you today.  Thank you for giving us  the opportunity to be involved in your care.  Below is a brief recap of your visit and next steps.  We will plan to see you again in March.  Summary Zpak and tessalon  perles prescribed today for treatment  Follow up  with Dr. Tobie as scheduled in March

## 2023-05-18 ENCOUNTER — Other Ambulatory Visit: Payer: Self-pay | Admitting: Internal Medicine

## 2023-05-18 DIAGNOSIS — G894 Chronic pain syndrome: Secondary | ICD-10-CM

## 2023-05-18 DIAGNOSIS — M170 Bilateral primary osteoarthritis of knee: Secondary | ICD-10-CM

## 2023-05-18 MED ORDER — OXYCODONE HCL 10 MG PO TABS: 10.0000 mg | ORAL_TABLET | Freq: Two times a day (BID) | ORAL | 0 refills | Status: DC | PRN

## 2023-05-18 NOTE — Telephone Encounter (Signed)
 Patient is also requesting refill on Oxycodone HCl 10 MG TABS [[id number]] Last filled 04/26/23. Please advise Thanks

## 2023-05-18 NOTE — Telephone Encounter (Signed)
 Unable to leave a message.

## 2023-05-20 ENCOUNTER — Telehealth: Payer: Self-pay | Admitting: Internal Medicine

## 2023-05-20 NOTE — Telephone Encounter (Signed)
Copied from CRM [id number]. Topic: Clinical - Prescription Issue >> May 19, 2023  8:21 AM Desma Mcgregor wrote: Reason for CRM: Oxycodone HCl 10 MG TABS (Starting on 05/25/2023). Told patient that the Rx has been sent to the pharmacy and its too soon to fill.  Advised to check with pharmacy. Still requesting to speak with Kia

## 2023-05-21 ENCOUNTER — Other Ambulatory Visit: Payer: Self-pay | Admitting: Internal Medicine

## 2023-05-23 ENCOUNTER — Other Ambulatory Visit: Payer: Self-pay | Admitting: Internal Medicine

## 2023-05-23 DIAGNOSIS — M170 Bilateral primary osteoarthritis of knee: Secondary | ICD-10-CM

## 2023-05-23 DIAGNOSIS — G894 Chronic pain syndrome: Secondary | ICD-10-CM

## 2023-05-24 DIAGNOSIS — H401222 Low-tension glaucoma, left eye, moderate stage: Secondary | ICD-10-CM | POA: Diagnosis not present

## 2023-05-24 DIAGNOSIS — H2513 Age-related nuclear cataract, bilateral: Secondary | ICD-10-CM | POA: Diagnosis not present

## 2023-05-24 DIAGNOSIS — H5213 Myopia, bilateral: Secondary | ICD-10-CM | POA: Diagnosis not present

## 2023-05-28 ENCOUNTER — Ambulatory Visit (INDEPENDENT_AMBULATORY_CARE_PROVIDER_SITE_OTHER): Payer: 59

## 2023-05-28 DIAGNOSIS — I255 Ischemic cardiomyopathy: Secondary | ICD-10-CM

## 2023-05-28 LAB — CUP PACEART REMOTE DEVICE CHECK
Battery Remaining Longevity: 144 mo
Battery Remaining Percentage: 100 %
Brady Statistic RV Percent Paced: 0 %
Date Time Interrogation Session: 20250124040100
HighPow Impedance: 89 Ohm
Implantable Lead Connection Status: 753985
Implantable Lead Implant Date: 20210422
Implantable Lead Location: 753860
Implantable Lead Model: 138
Implantable Lead Serial Number: 302700
Implantable Pulse Generator Implant Date: 20210422
Lead Channel Impedance Value: 431 Ohm
Lead Channel Setting Pacing Amplitude: 2.5 V
Lead Channel Setting Pacing Pulse Width: 0.4 ms
Lead Channel Setting Sensing Sensitivity: 0.5 mV
Pulse Gen Serial Number: 209495
Zone Setting Status: 755011

## 2023-06-03 DIAGNOSIS — H5213 Myopia, bilateral: Secondary | ICD-10-CM | POA: Diagnosis not present

## 2023-06-07 ENCOUNTER — Other Ambulatory Visit: Payer: Self-pay | Admitting: Physical Medicine and Rehabilitation

## 2023-06-21 ENCOUNTER — Telehealth: Payer: Self-pay | Admitting: Internal Medicine

## 2023-06-21 MED ORDER — OXYCODONE HCL 10 MG PO TABS: 10.0000 mg | ORAL_TABLET | Freq: Two times a day (BID) | ORAL | 0 refills | Status: DC | PRN

## 2023-06-21 NOTE — Telephone Encounter (Signed)
Prescription Request  06/21/2023  LOV: 04/09/2023  What is the name of the medication or equipment? ALPRAZolam (XANAX) 0.5 MG tablet [[id number]]  Oxycodone HCl 10 MG TABS [[id number]]    Have you contacted your pharmacy to request a refill? Yes   Which pharmacy would you like this sent to?  [facility] - [place], [place] - [address] [place] [address] Phone: [phone] Fax: [phone]    Patient notified that their request is being sent to the clinical staff for review and that they should receive a response within 2 business days.   Please advise at Mobile [phone] (mobile)

## 2023-06-21 NOTE — Addendum Note (Signed)
Addended byTrena Platt on: 06/21/2023 12:32 PM   Modules accepted: Orders

## 2023-06-21 NOTE — Telephone Encounter (Signed)
 Left detailed vm

## 2023-06-28 NOTE — Telephone Encounter (Signed)
 What is the name of the medication or equipment? ALPRAZolam (XANAX) 0.5 MG tablet [[id number]]  Oxycodone HCl 10 MG TABS [[id number]]      Have you contacted your pharmacy to request a refill? Yes    Which pharmacy would you like this sent to?  [facility] - [place], [place] - [address] [place] [address] Phone: [phone] Fax: [phone]     Patient notified that their request is being sent to the clinical staff for review and that they should receive a response within 2 business days.    Please advise at Mobile [phone] (mobile)

## 2023-06-29 NOTE — Progress Notes (Signed)
 Remote ICD transmission.

## 2023-07-01 DIAGNOSIS — H5213 Myopia, bilateral: Secondary | ICD-10-CM | POA: Diagnosis not present

## 2023-07-06 ENCOUNTER — Ambulatory Visit: Payer: 59 | Admitting: Internal Medicine

## 2023-07-09 ENCOUNTER — Ambulatory Visit: Payer: Medicare HMO | Admitting: Internal Medicine

## 2023-07-12 ENCOUNTER — Other Ambulatory Visit: Payer: Self-pay | Admitting: Internal Medicine

## 2023-07-12 DIAGNOSIS — G894 Chronic pain syndrome: Secondary | ICD-10-CM

## 2023-07-12 DIAGNOSIS — M170 Bilateral primary osteoarthritis of knee: Secondary | ICD-10-CM

## 2023-07-12 NOTE — Telephone Encounter (Signed)
 Copied from CRM [id number]. Topic: Clinical - Medication Refill >> Jul 12, 2023 11:44 AM Izetta Dakin wrote: Most Recent Primary Care Visit:  Provider: Christel Mormon E  Department: [facility]  Visit Type: ACUTE  Date: 05/11/2023  Medication: Oxycodone HCl 10 MG TABS  Has the patient contacted their pharmacy? No (Agent: If no, request that the patient contact the pharmacy for the refill. If patient does not wish to contact the pharmacy document the reason why and proceed with request.) (Agent: If yes, when and what did the pharmacy advise?)  Is this the correct pharmacy for this prescription? Yes If no, delete pharmacy and type the correct one.  This is the patient's preferred pharmacy:  [facility] - [place], [place] - [address] [place] [address] Phone: [phone] Fax: [phone]   Has the prescription been filled recently? No  Is the patient out of the medication? No  Has the patient been seen for an appointment in the last year OR does the patient have an upcoming appointment? Yes  Can we respond through MyChart? No  Agent: Please be advised that Rx refills may take up to 3 business days. We ask that you follow-up with your pharmacy.

## 2023-07-13 NOTE — Telephone Encounter (Signed)
 Copied from CRM [id number]. Topic: General - Other >> Jul 13, 2023 10:11 AM Macon Large wrote: Reason for CRM: Patient requests that Evette Cristal return his call at [phone]

## 2023-07-14 ENCOUNTER — Telehealth: Payer: Self-pay | Admitting: Internal Medicine

## 2023-07-14 ENCOUNTER — Other Ambulatory Visit: Payer: Self-pay | Admitting: Internal Medicine

## 2023-07-14 NOTE — Telephone Encounter (Signed)
 Prescription Request  07/14/2023  LOV: 04/09/2023  What is the name of the medication or equipment? Oxycodone HCl 10 MG TABS [[id number]]    Have you contacted your pharmacy to request a refill? No   Which pharmacy would you like this sent to?  [facility] - [place], [place] - [address] [place] [address] Phone: [phone] Fax: [phone]    Patient notified that their request is being sent to the clinical staff for review and that they should receive a response within 2 business days.   Please advise at [facility] [phone]

## 2023-07-14 NOTE — Telephone Encounter (Unsigned)
 Copied from CRM [id number]. Topic: Clinical - Medication Refill >> Jul 14, 2023  9:24 AM Nyra Capes wrote: Most Recent Primary Care Visit:  Provider: Christel Mormon E  Department: [facility]  Visit Type: ACUTE  Date: 05/11/2023  Medication: Oxycodone HCl 10 MG TABS   Has the patient contacted their pharmacy? No (Agent: If no, request that the patient contact the pharmacy for the refill. If patient does not wish to contact the pharmacy document the reason why and proceed with request.) (Agent: If yes, when and what did the pharmacy advise?)  Is this the correct pharmacy for this prescription? Yes If no, delete pharmacy and type the correct one.  This is the patient's preferred pharmacy:  [facility] - [place], [place] - [address] [place] [address] Phone: [phone] Fax: [phone]   Has the prescription been filled recently? Yes  Is the patient out of the medication? No  Has the patient been seen for an appointment in the last year OR does the patient have an upcoming appointment? Yes  Can we respond through MyChart? No  Agent: Please be advised that Rx refills may take up to 3 business days. We ask that you follow-up with your pharmacy.  Patient is insisting to speak to the front desk

## 2023-07-14 NOTE — Telephone Encounter (Signed)
 LAST SENT ON 06/24/23.

## 2023-07-19 ENCOUNTER — Other Ambulatory Visit: Payer: Self-pay | Admitting: Internal Medicine

## 2023-07-19 ENCOUNTER — Telehealth: Payer: Self-pay | Admitting: Internal Medicine

## 2023-07-19 DIAGNOSIS — G894 Chronic pain syndrome: Secondary | ICD-10-CM

## 2023-07-19 DIAGNOSIS — M170 Bilateral primary osteoarthritis of knee: Secondary | ICD-10-CM

## 2023-07-19 NOTE — Telephone Encounter (Signed)
 Prescription Request  07/19/2023  LOV: 04/09/2023  What is the name of the medication or equipment? ALPRAZolam (XANAX) 0.5 MG tablet [[id number]]  Oxycodone HCl 10 MG TABS [[id number]]    Have you contacted your pharmacy to request a refill? No   Which pharmacy would you like this sent to?  [facility] - [place], [place] - [address] [place] [address] Phone: [phone] Fax: [phone]    Patient notified that their request is being sent to the clinical staff for review and that they should receive a response within 2 business days.   Please advise at [facility] [phone]

## 2023-07-19 NOTE — Telephone Encounter (Signed)
 Pt.notified

## 2023-07-22 ENCOUNTER — Telehealth (HOSPITAL_COMMUNITY): Payer: Self-pay

## 2023-07-22 ENCOUNTER — Other Ambulatory Visit: Payer: Self-pay | Admitting: Internal Medicine

## 2023-07-22 DIAGNOSIS — M170 Bilateral primary osteoarthritis of knee: Secondary | ICD-10-CM

## 2023-07-22 DIAGNOSIS — G894 Chronic pain syndrome: Secondary | ICD-10-CM

## 2023-07-22 MED ORDER — OXYCODONE HCL 10 MG PO TABS: 10.0000 mg | ORAL_TABLET | Freq: Two times a day (BID) | ORAL | 0 refills | Status: DC | PRN

## 2023-07-22 NOTE — Telephone Encounter (Signed)
 Copied from CRM [id number]. Topic: Clinical - Medication Question >> Jul 22, 2023  9:17 AM Marland Kitchen D wrote: Patient wants to know when will it be sent over to the pharmacy- Oxycodone HCl 10 MG TABS I let patient know it was to early to get it filled again he still wants a call back on when it can be done.

## 2023-07-22 NOTE — Telephone Encounter (Signed)
 Called to confirm/remind patient of their appointment at the [facility] on 07/23/23.   Appointment:   [x] Confirmed  [] Left mess   [] No answer/No voice mail  [] Phone not in service  Patient reminded to bring all medications and/or complete list.  Confirmed patient has transportation. Gave directions, instructed to utilize valet parking.

## 2023-07-22 NOTE — Telephone Encounter (Signed)
 Can be filled on 07/27/23.

## 2023-07-23 ENCOUNTER — Encounter (HOSPITAL_COMMUNITY): Payer: Self-pay

## 2023-07-23 ENCOUNTER — Ambulatory Visit (HOSPITAL_COMMUNITY)
Admission: RE | Admit: 2023-07-23 | Discharge: 2023-07-23 | Disposition: A | Discharge status: Home / Self Care | Payer: Medicare HMO | Source: Ambulatory Visit | Attending: Family Medicine | Admitting: Family Medicine

## 2023-07-23 VITALS — BP 142/92 | HR 61 | Wt 225.2 lb

## 2023-07-23 DIAGNOSIS — I5022 Chronic systolic (congestive) heart failure: Secondary | ICD-10-CM | POA: Diagnosis not present

## 2023-07-23 DIAGNOSIS — I130 Hypertensive heart and chronic kidney disease with heart failure and stage 1 through stage 4 chronic kidney disease, or unspecified chronic kidney disease: Secondary | ICD-10-CM | POA: Diagnosis not present

## 2023-07-23 DIAGNOSIS — I10 Essential (primary) hypertension: Secondary | ICD-10-CM

## 2023-07-23 DIAGNOSIS — I252 Old myocardial infarction: Secondary | ICD-10-CM | POA: Diagnosis not present

## 2023-07-23 DIAGNOSIS — Z7982 Long term (current) use of aspirin: Secondary | ICD-10-CM | POA: Insufficient documentation

## 2023-07-23 DIAGNOSIS — N184 Chronic kidney disease, stage 4 (severe): Secondary | ICD-10-CM | POA: Insufficient documentation

## 2023-07-23 DIAGNOSIS — I2510 Atherosclerotic heart disease of native coronary artery without angina pectoris: Secondary | ICD-10-CM | POA: Diagnosis not present

## 2023-07-23 DIAGNOSIS — Z95810 Presence of automatic (implantable) cardiac defibrillator: Secondary | ICD-10-CM | POA: Diagnosis not present

## 2023-07-23 DIAGNOSIS — Z7902 Long term (current) use of antithrombotics/antiplatelets: Secondary | ICD-10-CM | POA: Insufficient documentation

## 2023-07-23 DIAGNOSIS — Z87891 Personal history of nicotine dependence: Secondary | ICD-10-CM | POA: Diagnosis not present

## 2023-07-23 LAB — BASIC METABOLIC PANEL
Anion gap: 8 (ref 5–15)
BUN: 39 mg/dL — ABNORMAL HIGH (ref 8–23)
CO2: 19 mmol/L — ABNORMAL LOW (ref 22–32)
Calcium: 10.2 mg/dL (ref 8.9–10.3)
Chloride: 116 mmol/L — ABNORMAL HIGH (ref 98–111)
Creatinine, Ser: 2.99 mg/dL — ABNORMAL HIGH (ref 0.61–1.24)
GFR, Estimated: 22 mL/min — ABNORMAL LOW (ref >60)
Glucose, Bld: 100 mg/dL — ABNORMAL HIGH (ref 70–99)
Potassium: 4.2 mmol/L (ref 3.5–5.1)
Sodium: 143 mmol/L (ref 135–145)

## 2023-07-23 LAB — BRAIN NATRIURETIC PEPTIDE: B Natriuretic Peptide: 318 pg/mL — ABNORMAL HIGH (ref 0.0–100.0)

## 2023-07-23 MED ORDER — NITROGLYCERIN 0.4 MG SL SUBL: 0.4000 mg | SUBLINGUAL_TABLET | SUBLINGUAL | 2 refills | Status: AC | PRN

## 2023-07-23 MED ORDER — LOSARTAN POTASSIUM 25 MG PO TABS: 12.5000 mg | ORAL_TABLET | Freq: Every day | ORAL | 3 refills | Status: DC

## 2023-07-23 NOTE — Addendum Note (Signed)
 Encounter addended by: Jacklynn Ganong, FNP on: 07/23/2023 3:54 PM  Actions taken: Clinical Note Signed

## 2023-07-23 NOTE — Patient Instructions (Signed)
 TAKE 40 mg lasix for the next 5 days, then go back to every Monday, Wednesday and Friday.  STOP Telmisartan  START Losartan 12.5 mg ( 1/2 Tab) daily.  Labs done today, your results will be available in MyChart, we will contact you for abnormal readings.  Your physician recommends that you schedule a follow-up appointment in: 3 months.  If you have any questions or concerns before your next appointment please send [place] a message through [place] or call our office at [phone].    TO LEAVE A MESSAGE FOR THE NURSE SELECT OPTION 2, PLEASE LEAVE A MESSAGE INCLUDING: YOUR NAME DATE OF BIRTH CALL BACK NUMBER REASON FOR CALL**this is important as we prioritize the call backs  YOU WILL RECEIVE A CALL BACK THE SAME DAY AS LONG AS YOU CALL BEFORE 4:00 PM  At the [facility], you and your health needs are our priority. As part of our continuing mission to provide you with exceptional heart care, we have created designated Provider Care Teams. These Care Teams include your primary Cardiologist (physician) and Advanced Practice Providers (APPs- Physician Assistants and Nurse Practitioners) who all work together to provide you with the care you need, when you need it.   You may see any of the following providers on your designated Care Team at your next follow up: Dr Arvilla Meres Dr Marca Ancona Dr. Dorthula Nettles Dr. Clearnce Hasten Amy Filbert Schilder, NP Robbie Lis, [place] [facility] [place] Brynda Peon, NP [place] Lee, NP Clarisa Kindred, NP Karle Plumber, PharmD Enos Fling, PharmD   Please be sure to bring in all your medications bottles to every appointment.    Thank you for choosing [facility]

## 2023-07-23 NOTE — Progress Notes (Addendum)
 ADVANCED HF CLINIC NOTE   Primary Cardiologist: Dr. Excell Seltzer Primary Care: Anabel Halon, MD Nephrologist: Dr. Karena Addison AHF: Dr. Gala Romney   HPI: [name] is a 68 y.o. male, former smoker with HTN, CKD IV, CAD s/p large anterolateral STEMI 9/20 and systolic HF. Has [employer] ICD.   Cath 03/12/2019 Total occlusion of the mid LAD treated with angioplasty and stenting using a 4.0 x 22 Onyx deployed at 14 atm.  0% stenosis was noted post procedure and TIMI grade III flow increased from 0 to 3.  The final angiographic images of the LAD demonstrates apical occlusion due to embolus. Luminal irregularities in the left main but no agreeable stenosis. Large circumflex with 3 obtuse marginals.  The first marginal is large and has a very distal 90% stenosis in 2 branches.  There is a trifurcation distally in the first marginal. RCA Moderate diffuse luminal irregularities but widely patent. Mid anterior wall to apical akinesis.  EF 30 to 35% acutely.  LVEDP 31 mmHg.  Post cath had low BP and shock but recovered. EF 20% by echo. No significant MR. Creatinine post cath 3.1-3.4. Now followed by nephrology.  Echo 3/21. EF 20-25%. RV normal.  S/P [employer] ICD implant 08/2019   Admitted to [facility] 10/22 with hgb 4.6 and SCr up to 4.2.Transfused 4u RBCs. Underwent EGD/colonoscopy 4mm x 8 mm prepyloric ulcer wuth prepyloric gastritis and duodenitis.   Echo 10/22 EF 30-35%   Echo 6/23 EF 20-25% RV ok   Echo 11/24 EF 32%, RV ok  Today he returns for HF follow up. Overall feeling fine. Drinking a lot of sweet tea & weight is up. He is not SOB walking on flat ground or with ADLs, otherwise not very active. Denies palpitations, abnormal bleeding, CP, dizziness, edema, or PND/Orthopnea. Appetite ok. No fever or chills. Not weighing at home. Taking all medications. BP 110/72 at home.  Review of systems complete and found to be negative unless listed in HPI.    Past Medical History:   Diagnosis Date   Acute ST elevation myocardial infarction (STEMI) due to occlusion of mid portion of left anterior descending (LAD) coronary artery (HCC) 03/12/2019   Acute systolic heart failure (HCC)    AICD (automatic cardioverter/defibrillator) present    Arthritis    CHF (congestive heart failure) (HCC)    CKD (chronic kidney disease) stage 4, GFR 15-29 ml/min (HCC)    Diabetes mellitus, type II (HCC)    Dyspnea    with exertionm periodically   Heart attack (HCC) 03/15/2019   anterior MI    History of kidney stones    Hyperparathyroidism (HCC)    Hypertension 2002   Ischemic cardiomyopathy    Left knee pain    Syncope 04/04/2019   after blood draw   Vitamin D deficiency    Current Outpatient Medications  Medication Sig Dispense Refill   allopurinol (ZYLOPRIM) 100 MG tablet Take 100 mg by mouth daily.      ALPRAZolam (XANAX) 0.5 MG tablet Take 1 tablet (0.5 mg total) by mouth 2 (two) times daily as needed for anxiety. 60 tablet 2   aspirin 81 MG chewable tablet Chew 1 tablet (81 mg total) by mouth 2 (two) times daily. 60 tablet 0   atorvastatin (LIPITOR) 80 MG tablet TAKE 1 TABLET BY MOUTH DAILY AT 6PM 90 tablet 0   BRILINTA 60 MG TABS tablet TAKE (1) TABLET BY MOUTH TWICE DAILY. 180 tablet 3   carvedilol (COREG) 12.5 MG tablet TAKE  1 TABLET BY MOUTH TWICE DAILY WITH MEALS 180 tablet 3   cinacalcet (SENSIPAR) 30 MG tablet Take 30 mg by mouth daily.     furosemide (LASIX) 40 MG tablet Take 1 tablet (40 mg total) by mouth 3 (three) times a week. Monday, Wednesday and Friday 30 tablet 3   ibuprofen (ADVIL) 800 MG tablet Take 800 mg by mouth every 8 (eight) hours as needed.     ivabradine (CORLANOR) 5 MG TABS tablet TAKE 1 TABLET BY MOUTH TWICE DAILY WITH A MEAL 60 tablet 11   nitroGLYCERIN (NITROSTAT) 0.4 MG SL tablet Place 1 tablet (0.4 mg total) under the tongue every 5 (five) minutes as needed. 25 tablet 2   [START ON 07/27/2023] Oxycodone HCl 10 MG TABS Take 1 tablet (10 mg  total) by mouth 2 (two) times daily as needed (Severe pain). 60 tablet 0   pantoprazole (PROTONIX) 40 MG tablet Take 1 tablet (40 mg total) by mouth daily before breakfast. 90 tablet 3   telmisartan (MICARDIS) 20 MG tablet Take 10 mg by mouth daily.     No current facility-administered medications for this encounter.   No Known Allergies  Social History   Socioeconomic History   Marital status: Single    Spouse name: Not on file   Number of children: 0   Years of education: Not on file   Highest education level: Some college, no degree  Occupational History   Occupation: retired  Tobacco Use   Smoking status: Former    Current packs/day: 0.00    Average packs/day: 0.5 packs/day for 40.0 years (20.0 ttl pk-yrs)    Types: Cigarettes    Start date: 03/12/1979    Quit date: 03/12/2019    Years since quitting: 4.3   Smokeless tobacco: Never  Vaping Use   Vaping status: Never Used  Substance and Sexual Activity   Alcohol use: No   Drug use: No   Sexual activity: Not Currently  Other Topics Concern   Not on file  Social History Narrative   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       Enjoys: fishing, shopping, working around the house       Diet: working on changes, avoiding fried foods, increased veggies   Caffeine: teas some daily   Water: 6-8 cups daily       Wears seat belt    Does not use phone while driving    Psychologist, sport and 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    Social Drivers of Health   Financial Resource Strain: Low Risk  (01/19/2022)   Overall Financial Resource Strain (CARDIA)    Difficulty of Paying Living Expenses: Not hard at all  Food Insecurity: No Food Insecurity (04/03/2022)   Hunger Vital Sign    Worried About Running Out of Food in the Last Year: Never true    Ran Out of Food in the Last Year: Never true  Transportation Needs: No Transportation Needs (02/02/2022)   PRAPARE - Administrator, Civil Service (Medical): No    Lack of Transportation (Non-Medical): No   Physical Activity: Sufficiently Active (01/19/2022)   Exercise Vital Sign    Days of Exercise per Week: 7 days    Minutes of Exercise per Session: 30 min  Stress: No Stress Concern Present (01/19/2022)   [employer] of Occupational Health - Occupational Stress Questionnaire    Feeling of Stress : Not at all  Social Connections: Moderately Isolated (01/19/2022)   Social Connection and Isolation Panel [NHANES]  Frequency of Communication with Friends and Family: More than three times a week    Frequency of Social Gatherings with Friends and Family: More than three times a week    Attends Religious Services: More than 4 times per year    Active Member of [employer] or Organizations: No    Attends Banker Meetings: Never    Marital Status: Never married  Intimate Partner Violence: Not At Risk (02/02/2022)   Humiliation, Afraid, Rape, and Kick questionnaire    Fear of Current or Ex-Partner: No    Emotionally Abused: No    Physically Abused: No    Sexually Abused: No   Family History  Problem Relation Age of Onset   Heart failure Mother    Heart disease Mother    Cancer Mother        breast   Heart attack Father    Hypertension Father    Heart disease Father    Diabetes Brother    BP (!) 142/92   Pulse 61   Wt 102.2 kg (225 lb 3.2 oz)   SpO2 99%   BMI 30.54 kg/m   Wt Readings from Last 3 Encounters:  07/23/23 102.2 kg (225 lb 3.2 oz)  05/11/23 94.9 kg (209 lb 3.2 oz)  04/09/23 95.8 kg (211 lb 3.2 oz)   PHYSICAL EXAM: General:  NAD. No resp difficulty, walked into clinic HEENT: Normal Neck: Supple. JVP 8-10 Cor: Regular rate & rhythm. No rubs, gallops or murmurs. Lungs: Clear Abdomen: Soft, obese, nontender, nondistended.  Extremities: No cyanosis, clubbing, rash, edema Neuro: Alert & oriented x 3, moves all 4 extremities w/o difficulty. Affect pleasant.  Device interrogation: unable to interrogate device in clinic today  ReDs reading: 36 %,  abnormal  ASSESSMENT & PLAN: 1. CAD  - s/p acute anterolateral STEMI 03/12/19 - s/p DES LAD 11/20 - No s/s angina - Continue ASA 81 + Brilinta - Continue atorvastatin 80 mg  2. Chronic Systolic HF - EF 09%. Following anteriorlateral stemi - Echo 3/21: EF 20-25%. RV ok.  - Echo 10/22: EF 30-35% in setting of severe anemia - Has [employer] ICD.  - Echo 11/24: EF 32%, RV ok - NYHA II, volume up a bit. ReDs 36%, weight up. - Increase Lasix 40 mg daily x 5 days, then back to 40 mg MWF - Stop telmisartan (not a HF ARB), and start losartan 12.5 mg daily. - Continue Coreg 12.5 mg bid - Continue ivabradine 5 mg bid.  - Not candidate for ARNI, MRA or SGLT2i with CKD IV. - I worry his HF is progressing. Likely not candidate for heart-kidney transplant given age - Labs today.  3. CKD IV - Due to HTN nephropathy - SCr ranges 2.8-3.8 (GFR has ranged 10-20) - Follows with Dr. Holly Bodily.  - Labs today.  4. HTN - BP elevated in clinic, but just took AM meds - Do not want to drop BP too low with CKD IV. - Consider adding low-dose BiDil back.  5. Tobacco use - Quit after MI.  - Congratulated on continued cessation.  Follow up in 3 months with Dr. Gala Romney  Jacklynn Ganong, FNP  9:41 AM

## 2023-07-23 NOTE — Progress Notes (Signed)
 ReDS Vest / Clip - 07/23/23 1000       ReDS Vest / Clip   Station Marker D    Ruler Value 32    ReDS Value Range Moderate volume overload    ReDS Actual Value 36

## 2023-07-27 ENCOUNTER — Ambulatory Visit: Payer: Self-pay | Admitting: Internal Medicine

## 2023-07-27 ENCOUNTER — Encounter: Payer: Self-pay | Admitting: Internal Medicine

## 2023-07-27 VITALS — BP 144/85 | HR 58 | Ht 72.0 in | Wt 218.0 lb

## 2023-07-27 DIAGNOSIS — I5022 Chronic systolic (congestive) heart failure: Secondary | ICD-10-CM

## 2023-07-27 DIAGNOSIS — I10 Essential (primary) hypertension: Secondary | ICD-10-CM | POA: Diagnosis not present

## 2023-07-27 DIAGNOSIS — E212 Other hyperparathyroidism: Secondary | ICD-10-CM | POA: Diagnosis not present

## 2023-07-27 DIAGNOSIS — E1121 Type 2 diabetes mellitus with diabetic nephropathy: Secondary | ICD-10-CM | POA: Diagnosis not present

## 2023-07-27 DIAGNOSIS — M170 Bilateral primary osteoarthritis of knee: Secondary | ICD-10-CM

## 2023-07-27 DIAGNOSIS — G894 Chronic pain syndrome: Secondary | ICD-10-CM

## 2023-07-27 DIAGNOSIS — F411 Generalized anxiety disorder: Secondary | ICD-10-CM

## 2023-07-27 DIAGNOSIS — N184 Chronic kidney disease, stage 4 (severe): Secondary | ICD-10-CM | POA: Diagnosis not present

## 2023-07-27 MED ORDER — ALPRAZOLAM 0.5 MG PO TABS: 0.5000 mg | ORAL_TABLET | Freq: Two times a day (BID) | ORAL | 2 refills | Status: DC | PRN

## 2023-07-27 NOTE — Assessment & Plan Note (Signed)
 BP Readings from Last 1 Encounters:  07/27/23 (!) 144/85   Well-controlled, followed by Nephrology On Coreg 12.5 mg BID and Losartan 12.5 mg QD Counseled for compliance with the medications Advised DASH diet and ambulation as tolerated

## 2023-07-27 NOTE — Assessment & Plan Note (Signed)
 Lab Results  Component Value Date   HGBA1C 6.2 (H) 04/09/2023   Diet controlled On statin Would be a good candidate for SGLT2i, but GFR close to 20 now On Telmisartan now for proteinuria

## 2023-07-27 NOTE — Assessment & Plan Note (Signed)
Progressive CKD stage 4 Follows up with Nephrology On Sensipar and sodium bicarb tablets Avoid nephrotoxic agents, including NSAIDs

## 2023-07-27 NOTE — Assessment & Plan Note (Signed)
 Uncontrolled, likely due to lack of sleep and grief reaction from his mother's death Has responded well to Xanax in the past Increased frequency of Xanax to 0.5 mg BID as needed recently, would avoid increasing dose for now Denies taking any other medicine

## 2023-07-27 NOTE — Assessment & Plan Note (Signed)
Appears euvolemic On Coreg, Corlanor and Lasix On Aspirin and statin for CAD F/u with Cardiology

## 2023-07-27 NOTE — Progress Notes (Unsigned)
 Established Patient Office Visit  Subjective:  Patient ID: [name], male    DOB: Dec 07, 1955  Age: 68 y.o. MRN: [id number]  CC:  Chief Complaint  Patient presents with   Care Management    4 month f/u, reports sx of insomnia worsened.    HPI [name] is a 68 y.o. male with past medical history of CAD s/p CABG, ischemic cardiomyopathy, CKD and OA who presents for f/u of his chronic medical conditions.  Chronic pain syndrome: He has chronic bilateral knee pain and takes oxycodone 10 mg twice daily for it.  He has had left TKA and is planned to get right TKA.  He is unable to take NSAIDs due to CKD and CAD.  GAD and insomnia: He was taking care of of his mother, but has been more anxious since her death. He takes Xanax 0.5 mg BID for GAD and insomnia.  He reports recent worsening of insomnia.  Upon discussion, he prefers to avoid any other medicine except Xanax.  Denies SI or HI currently.  He is currently on DAPT and statin for CAD. BP is well-controlled now. He was having dizziness with Coreg 25 mg BID. His dose has been reduced to 12.5 mg BID, which has resolved his dizziness. Takes medications regularly. Patient denies headache, dizziness, chest pain, dyspnea or palpitations.  CKD and anemia: Followed by Dr. Wolfgang Phoenix.  Denies any dysuria, hematuria or urinary hesitance or resistance. His GFR was 22 in 03/25.  He takes Sensipar currently.   Past Medical History:  Diagnosis Date   Acute ST elevation myocardial infarction (STEMI) due to occlusion of mid portion of left anterior descending (LAD) coronary artery (HCC) 03/12/2019   Acute systolic heart failure (HCC)    AICD (automatic cardioverter/defibrillator) present    Arthritis    CHF (congestive heart failure) (HCC)    CKD (chronic kidney disease) stage 4, GFR 15-29 ml/min (HCC)    Diabetes mellitus, type II (HCC)    Dyspnea    with exertionm periodically   Heart attack (HCC) 03/15/2019   anterior MI    History  of kidney stones    Hyperparathyroidism (HCC)    Hypertension 2002   Ischemic cardiomyopathy    Left knee pain    Syncope 04/04/2019   after blood draw   Vitamin D deficiency     Past Surgical History:  Procedure Laterality Date   AMPUTATION TOE Left 1991   2 digit of left foot   BIOPSY  02/26/2021   Procedure: BIOPSY;  Surgeon: Malissa Hippo, MD;  Location: [facility];  Service: Endoscopy;;   CHOLECYSTECTOMY     COLONOSCOPY     COLONOSCOPY WITH PROPOFOL N/A 02/26/2021   Procedure: COLONOSCOPY WITH PROPOFOL;  Surgeon: Malissa Hippo, MD;  Location: [facility];  Service: Endoscopy;  Laterality: N/A;   CORONARY/GRAFT ACUTE MI REVASCULARIZATION N/A 03/12/2019   Procedure: Coronary/Graft Acute MI Revascularization;  Surgeon: Lyn Records, MD;  Location: [facility];  Service: Cardiovascular;  Laterality: N/A;   ESOPHAGOGASTRODUODENOSCOPY (EGD) WITH PROPOFOL N/A 02/26/2021   Procedure: ESOPHAGOGASTRODUODENOSCOPY (EGD) WITH PROPOFOL;  Surgeon: Malissa Hippo, MD;  Location: [facility];  Service: Endoscopy;  Laterality: N/A;   ICD IMPLANT N/A 08/24/2019   Procedure: ICD IMPLANT;  Surgeon: Marinus Maw, MD;  Location: [facility] INVASIVE CV LAB;  Service: Cardiovascular;  Laterality: N/A;   IR URETERAL STENT LEFT NEW ACCESS W/O SEP NEPHROSTOMY CATH  09/26/2019   LEFT HEART CATH AND  CORONARY ANGIOGRAPHY N/A 03/12/2019   Procedure: LEFT HEART CATH AND CORONARY ANGIOGRAPHY;  Surgeon: Lyn Records, MD;  Location: [facility] INV[facility] CV LAB;  Service: Cardiovascular;  Laterality: N/A;   LITHOTRIPSY Right    NEPHROLITHOTOMY Left 09/26/2019   Procedure: LEFT NEPHROLITHOTOMY PERCUTANEOUS;  Surgeon: Bjorn Pippin, MD;  Location: [facility];  Service: Urology;  Laterality: Left;   TOTAL KNEE ARTHROPLASTY Left 02/02/2022   Procedure: TOTAL KNEE ARTHROPLASTY;  Surgeon: Dannielle Huh, MD;  Location: [facility];  Service: Orthopedics;  Laterality: Left;    Family History  Problem Relation Age of Onset    Heart failure Mother    Heart disease Mother    Cancer Mother        breast   Heart attack Father    Hypertension Father    Heart disease Father    Diabetes Brother     Social History   Socioeconomic History   Marital status: Single    Spouse name: Not on file   Number of children: 0   Years of education: Not on file   Highest education level: Some college, no degree  Occupational History   Occupation: retired  Tobacco Use   Smoking status: Former    Current packs/day: 0.00    Average packs/day: 0.5 packs/day for 40.0 years (20.0 ttl pk-yrs)    Types: Cigarettes    Start date: 03/12/1979    Quit date: 03/12/2019    Years since quitting: 4.3   Smokeless tobacco: Never  Vaping Use   Vaping status: Never Used  Substance and Sexual Activity   Alcohol use: No   Drug use: No   Sexual activity: Not Currently  Other Topics Concern   Not on file  Social History Narrative   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       Enjoys: fishing, shopping, working around the house       Diet: working on changes, avoiding fried foods, increased veggies   Caffeine: teas some daily   Water: 6-8 cups daily       Wears seat belt    Does not use phone while driving    Psychologist, sport and 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    Social Drivers of Health   Financial Resource Strain: Low Risk  (01/19/2022)   Overall Financial Resource Strain (CARDIA)    Difficulty of Paying Living Expenses: Not hard at all  Food Insecurity: No Food Insecurity (04/03/2022)   Hunger Vital Sign    Worried About Running Out of Food in the Last Year: Never true    Ran Out of Food in the Last Year: Never true  Transportation Needs: No Transportation Needs (02/02/2022)   PRAPARE - Administrator, Civil Service (Medical): No    Lack of Transportation (Non-Medical): No  Physical Activity: Sufficiently Active (01/19/2022)   Exercise Vital Sign    Days of Exercise per Week: 7 days    Minutes of Exercise per Session: 30 min  Stress: No  Stress Concern Present (01/19/2022)   [employer] of Occupational Health - Occupational Stress Questionnaire    Feeling of Stress : Not at all  Social Connections: Moderately Isolated (01/19/2022)   Social Connection and Isolation Panel [NHANES]    Frequency of Communication with Friends and Family: More than three times a week    Frequency of Social Gatherings with Friends and Family: More than three times a week    Attends Religious Services: More than 4 times per year    Active Member  of Clubs or Organizations: No    Attends Banker Meetings: Never    Marital Status: Never married  Intimate Partner Violence: Not At Risk (02/02/2022)   Humiliation, Afraid, Rape, and Kick questionnaire    Fear of Current or Ex-Partner: No    Emotionally Abused: No    Physically Abused: No    Sexually Abused: No    Outpatient Medications Prior to Visit  Medication Sig Dispense Refill   allopurinol (ZYLOPRIM) 100 MG tablet Take 100 mg by mouth daily.      aspirin 81 MG chewable tablet Chew 1 tablet (81 mg total) by mouth 2 (two) times daily. 60 tablet 0   atorvastatin (LIPITOR) 80 MG tablet TAKE 1 TABLET BY MOUTH DAILY AT 6PM 90 tablet 0   BRILINTA 60 MG TABS tablet TAKE (1) TABLET BY MOUTH TWICE DAILY. 180 tablet 3   carvedilol (COREG) 12.5 MG tablet TAKE 1 TABLET BY MOUTH TWICE DAILY WITH MEALS 180 tablet 3   cinacalcet (SENSIPAR) 30 MG tablet Take 30 mg by mouth daily.     furosemide (LASIX) 40 MG tablet Take 1 tablet (40 mg total) by mouth 3 (three) times a week. Monday, Wednesday and Friday 30 tablet 3   ibuprofen (ADVIL) 800 MG tablet Take 800 mg by mouth every 8 (eight) hours as needed.     ivabradine (CORLANOR) 5 MG TABS tablet TAKE 1 TABLET BY MOUTH TWICE DAILY WITH A MEAL 60 tablet 11   losartan (COZAAR) 25 MG tablet Take 0.5 tablets (12.5 mg total) by mouth daily. 45 tablet 3   nitroGLYCERIN (NITROSTAT) 0.4 MG SL tablet Place 1 tablet (0.4 mg total) under the tongue every 5  (five) minutes as needed. 25 tablet 2   Oxycodone HCl 10 MG TABS Take 1 tablet (10 mg total) by mouth 2 (two) times daily as needed (Severe pain). 60 tablet 0   pantoprazole (PROTONIX) 40 MG tablet Take 1 tablet (40 mg total) by mouth daily before breakfast. 90 tablet 3   ALPRAZolam (XANAX) 0.5 MG tablet Take 1 tablet (0.5 mg total) by mouth 2 (two) times daily as needed for anxiety. 60 tablet 2   No facility-administered medications prior to visit.    No Known Allergies  ROS Review of Systems  Constitutional:  Negative for chills and fever.  HENT:  Negative for ear discharge and sore throat.   Eyes:  Negative for pain and discharge.  Respiratory:  Negative for cough and shortness of breath.   Cardiovascular:  Negative for chest pain and palpitations.  Gastrointestinal:  Negative for constipation, diarrhea, nausea and vomiting.  Endocrine: Negative for polydipsia and polyuria.  Genitourinary:  Negative for dysuria and hematuria.  Musculoskeletal:  Positive for arthralgias and back pain. Negative for neck pain and neck stiffness.  Skin:  Negative for rash.  Neurological:  Negative for dizziness, weakness and numbness.  Psychiatric/Behavioral:  Positive for sleep disturbance. Negative for agitation and behavioral problems. The patient is nervous/anxious.       Objective:    Physical Exam Vitals reviewed.  Constitutional:      General: He is not in acute distress.    Appearance: He is not diaphoretic.  HENT:     Head: Normocephalic and atraumatic.     Right Ear: There is no impacted cerumen.     Nose: Nose normal.     Mouth/Throat:     Mouth: Mucous membranes are moist.  Eyes:     General: No scleral icterus.  Extraocular Movements: Extraocular movements intact.  Cardiovascular:     Rate and Rhythm: Normal rate and regular rhythm.     Heart sounds: Normal heart sounds. No murmur heard. Pulmonary:     Breath sounds: Normal breath sounds. No wheezing or rales.   Musculoskeletal:        General: Tenderness (B/l knee, with mild swelling) present.     Cervical back: Neck supple. No tenderness.     Right lower leg: No edema.     Left lower leg: No edema.  Skin:    General: Skin is warm.     Findings: No rash.  Neurological:     General: No focal deficit present.     Mental Status: He is alert and oriented to person, place, and time.  Psychiatric:        Mood and Affect: Mood normal.        Behavior: Behavior normal.     BP (!) 144/85 (BP Location: Left Arm)   Pulse (!) 58   Ht 6' (1.829 m)   Wt 218 lb (98.9 kg)   SpO2 99%   BMI 29.57 kg/m  Wt Readings from Last 3 Encounters:  07/27/23 218 lb (98.9 kg)  07/23/23 225 lb 3.2 oz (102.2 kg)  05/11/23 209 lb 3.2 oz (94.9 kg)    Lab Results  Component Value Date   TSH 0.993 08/28/2020   Lab Results  Component Value Date   WBC 4.7 02/02/2022   HGB 11.6 (L) 02/02/2022   HCT 35.1 (L) 02/02/2022   MCV 95.1 02/02/2022   PLT 163 02/02/2022   Lab Results  Component Value Date   NA 143 07/23/2023   K 4.2 07/23/2023   CO2 19 (L) 07/23/2023   GLUCOSE 100 (H) 07/23/2023   BUN 39 (H) 07/23/2023   CREATININE 2.99 (H) 07/23/2023   BILITOT 1.4 (H) 02/02/2022   ALKPHOS 95 02/02/2022   AST 19 02/02/2022   ALT 15 02/02/2022   PROT 7.0 02/02/2022   ALBUMIN 3.3 (L) 02/02/2022   CALCIUM 10.2 07/23/2023   ANIONGAP 8 07/23/2023   EGFR 24 02/26/2023   Lab Results  Component Value Date   CHOL 126 08/28/2020   Lab Results  Component Value Date   HDL 52 08/28/2020   Lab Results  Component Value Date   LDLCALC 53 08/28/2020   Lab Results  Component Value Date   TRIG 118 08/28/2020   Lab Results  Component Value Date   CHOLHDL 2.4 08/28/2020   Lab Results  Component Value Date   HGBA1C 6.2 (H) 04/09/2023      Assessment & Plan:   Problem List Items Addressed This Visit       Cardiovascular and Mediastinum   Essential hypertension - Primary   BP Readings from Last 1  Encounters:  07/27/23 (!) 144/85   Well-controlled, followed by Nephrology On Coreg 12.5 mg BID and Losartan 12.5 mg QD Counseled for compliance with the medications Advised DASH diet and ambulation as tolerated      Congestive heart failure (HCC)   Appears euvolemic On Coreg, Corlanor and Lasix On Aspirin and statin for CAD F/u with Cardiology        Endocrine   Type 2 diabetes with nephropathy (HCC)   Lab Results  Component Value Date   HGBA1C 6.2 (H) 04/09/2023   Diet controlled On statin Would be a good candidate for SGLT2i, but GFR close to 20 now On Telmisartan now for proteinuria  Other hyperparathyroidism (HCC)   Takes Sensipar Followed by Nephrology        Musculoskeletal and Integument   Primary osteoarthritis of both knees   S/p left TKA Has seen Orthopedic surgery, planning to get right TKA Has had steroid injection in the past Unable to take NSAIDs due to CKD and CAD Refilled Oxycodone for now, advised to take it BID PRN and avoid TID dose -strictly advised to avoid any other illicit substance use        Genitourinary   Chronic kidney disease, stage IV (severe) (HCC)   Progressive CKD stage 4 Follows up with Nephrology On Sensipar and sodium bicarb tablets Avoid nephrotoxic agents, including NSAIDs        Other   Chronic pain syndrome   From OA of knee and DDD of lumbar spine On oxycodone 10 mg twice daily as needed for severe pain Last urine tox assure showed fentanyl, not prescribed by any provider - but he thinks that he was poisoned (?) and appears surprised by the test Check urine tox assure in the next visit -has been noncompliant with dosing at times If persistent noncompliance, will stop prescribing controlled substances      GAD (generalized anxiety disorder)   Uncontrolled, likely due to lack of sleep and grief reaction from his mother's death Has responded well to Xanax in the past Increased frequency of Xanax to 0.5 mg BID  as needed recently, would avoid increasing dose for now Denies taking any other medicine      Relevant Medications   ALPRAZolam (XANAX) 0.5 MG tablet     Has taken pneumococcal vaccine at [place]. Has had Eye exam at Dr Dellia Nims office.  Meds ordered this encounter  Medications   ALPRAZolam (XANAX) 0.5 MG tablet    Sig: Take 1 tablet (0.5 mg total) by mouth 2 (two) times daily as needed for anxiety.    Dispense:  60 tablet    Refill:  2    No more than 3 refills before 10/22/23    Follow-up: Return in about 4 months (around 11/26/2023) for GAD and chronic pain.    Anabel Halon, MD

## 2023-07-27 NOTE — Patient Instructions (Signed)
Please continue to take medications as prescribed. ? ?Please continue to follow low salt diet and ambulate as tolerated. ?

## 2023-07-28 NOTE — Assessment & Plan Note (Signed)
 S/p left TKA Has seen Orthopedic surgery, planning to get right TKA Has had steroid injection in the past Unable to take NSAIDs due to CKD and CAD Refilled Oxycodone for now, advised to take it BID PRN and avoid TID dose -strictly advised to avoid any other illicit substance use

## 2023-07-28 NOTE — Assessment & Plan Note (Signed)
 From OA of knee and DDD of lumbar spine On oxycodone 10 mg twice daily as needed for severe pain Last urine tox assure showed fentanyl, not prescribed by any provider - but he thinks that he was poisoned (?) and appears surprised by the test Check urine tox assure in the next visit -has been noncompliant with dosing at times If persistent noncompliance, will stop prescribing controlled substances

## 2023-07-28 NOTE — Assessment & Plan Note (Signed)
 Takes Sensipar Followed by Nephrology

## 2023-08-02 ENCOUNTER — Ambulatory Visit: Payer: Medicare HMO | Admitting: Podiatry

## 2023-08-04 ENCOUNTER — Ambulatory Visit: Payer: 59 | Admitting: Internal Medicine

## 2023-08-12 ENCOUNTER — Telehealth: Payer: Self-pay | Admitting: Pharmacy Technician

## 2023-08-12 NOTE — Telephone Encounter (Signed)
 Pharmacy Patient Advocate Encounter   Received notification from Onbase that prior authorization for Ivabradine 5MG  BID is required/requested.   Insurance verification completed.   The patient is insured through [facility] .   Per test claim: PA required; PA submitted to above mentioned insurance via CoverMyMeds Key/confirmation #/EOC [id number] Status is pending

## 2023-08-13 NOTE — Telephone Encounter (Signed)
 Pharmacy Patient Advocate Encounter  Received notification from [facility] that Prior Authorization for ivabradine has been APPROVED from 08/12/23 to 05/03/24. Spoke to pharmacy to process.Copay is $0.00.    PA #/Case ID/Reference #: [id number]

## 2023-08-17 ENCOUNTER — Telehealth: Payer: Self-pay | Admitting: Internal Medicine

## 2023-08-17 NOTE — Telephone Encounter (Signed)
Placard  Noted Copied Sleeved (put in provider box)  Call patient when ready

## 2023-08-18 NOTE — Telephone Encounter (Signed)
 Patient picked up forms.

## 2023-08-24 ENCOUNTER — Other Ambulatory Visit: Payer: Self-pay | Admitting: Internal Medicine

## 2023-08-24 ENCOUNTER — Telehealth: Payer: Self-pay | Admitting: Internal Medicine

## 2023-08-24 DIAGNOSIS — G894 Chronic pain syndrome: Secondary | ICD-10-CM

## 2023-08-24 DIAGNOSIS — M170 Bilateral primary osteoarthritis of knee: Secondary | ICD-10-CM

## 2023-08-24 NOTE — Telephone Encounter (Signed)
 Prescription Request  08/24/2023  LOV: 07/27/2023  What is the name of the medication or equipment? Oxycodone  HCl 10 MG TABS [[id number]]   Have you contacted your pharmacy to request a refill? Yes   Which pharmacy would you like this sent to?  [facility] - [place], [place] - [address] [place] [address] Phone: [phone] Fax: [phone]    Patient notified that their request is being sent to the clinical staff for review and that they should receive a response within 2 business days.   Please advise at  walked into the office

## 2023-08-27 ENCOUNTER — Ambulatory Visit (INDEPENDENT_AMBULATORY_CARE_PROVIDER_SITE_OTHER): Payer: Medicare Other

## 2023-08-27 DIAGNOSIS — I255 Ischemic cardiomyopathy: Secondary | ICD-10-CM | POA: Diagnosis not present

## 2023-08-27 LAB — CUP PACEART REMOTE DEVICE CHECK
Battery Remaining Longevity: 138 mo
Battery Remaining Percentage: 96 %
Brady Statistic RV Percent Paced: 0 %
Date Time Interrogation Session: 20250425040000
HighPow Impedance: 74 Ohm
Implantable Lead Connection Status: 753985
Implantable Lead Implant Date: 20210422
Implantable Lead Location: 753860
Implantable Lead Model: 138
Implantable Lead Serial Number: 302700
Implantable Pulse Generator Implant Date: 20210422
Lead Channel Impedance Value: 457 Ohm
Lead Channel Setting Pacing Amplitude: 2.5 V
Lead Channel Setting Pacing Pulse Width: 0.4 ms
Lead Channel Setting Sensing Sensitivity: 0.5 mV
Pulse Gen Serial Number: 209495
Zone Setting Status: 755011

## 2023-09-06 ENCOUNTER — Ambulatory Visit: Attending: Internal Medicine | Admitting: Internal Medicine

## 2023-09-08 ENCOUNTER — Other Ambulatory Visit: Payer: Self-pay | Admitting: Internal Medicine

## 2023-09-14 ENCOUNTER — Other Ambulatory Visit: Payer: Self-pay | Admitting: Internal Medicine

## 2023-09-14 DIAGNOSIS — G894 Chronic pain syndrome: Secondary | ICD-10-CM

## 2023-09-14 DIAGNOSIS — M170 Bilateral primary osteoarthritis of knee: Secondary | ICD-10-CM

## 2023-09-20 ENCOUNTER — Telehealth: Payer: Self-pay | Admitting: Internal Medicine

## 2023-09-20 NOTE — Telephone Encounter (Signed)
Pt informed

## 2023-09-20 NOTE — Telephone Encounter (Signed)
 Prescription Request  09/20/2023  LOV: 07/27/2023  What is the name of the medication or equipment? ALPRAZolam  (XANAX ) 0.5 MG tablet [[id number]]    Have you contacted your pharmacy to request a refill? No  Oxycodone  HCl 10 MG TABS [[id number]]   Which pharmacy would you like this sent to?  [facility] - [place], [place] - [address] [place] [address] Phone: [phone] Fax: [phone]    Patient notified that their request is being sent to the clinical staff for review and that they should receive a response within 2 business days.   Please advise at [facility] [phone]

## 2023-09-22 ENCOUNTER — Ambulatory Visit

## 2023-10-04 NOTE — Progress Notes (Signed)
 Remote ICD transmission.

## 2023-10-15 ENCOUNTER — Telehealth: Payer: Self-pay | Admitting: Internal Medicine

## 2023-10-15 NOTE — Telephone Encounter (Signed)
 Prescription Request  10/15/2023  LOV: 07/27/2023  What is the name of the medication or equipment? Oxycodone  HCl 10 MG TABS [[id number]]   ALPRAZolam  (XANAX ) 0.5 MG tablet [[id number]]   ALPRAZolam  (XANAX ) 0.5 MG tablet [[id number]]   Have you contacted your pharmacy to request a refill? Yes   Which pharmacy would you like this sent to?  [facility] - [place], [place] - [address] [place] [address] Phone: [phone] Fax: [phone]    Patient notified that their request is being sent to the clinical staff for review and that they should receive a response within 2 business days.   Please advise at walked into the office

## 2023-10-18 ENCOUNTER — Other Ambulatory Visit: Payer: Self-pay | Admitting: Internal Medicine

## 2023-10-18 DIAGNOSIS — M170 Bilateral primary osteoarthritis of knee: Secondary | ICD-10-CM

## 2023-10-18 DIAGNOSIS — G894 Chronic pain syndrome: Secondary | ICD-10-CM

## 2023-10-18 DIAGNOSIS — F411 Generalized anxiety disorder: Secondary | ICD-10-CM

## 2023-10-18 MED ORDER — ALPRAZOLAM 0.5 MG PO TABS: 0.5000 mg | ORAL_TABLET | Freq: Two times a day (BID) | ORAL | 2 refills | Status: DC | PRN

## 2023-10-18 MED ORDER — OXYCODONE HCL 10 MG PO TABS: 10.0000 mg | ORAL_TABLET | Freq: Two times a day (BID) | ORAL | 0 refills | Status: DC | PRN

## 2023-10-22 ENCOUNTER — Telehealth (HOSPITAL_COMMUNITY): Payer: Self-pay | Admitting: Internal Medicine

## 2023-10-22 NOTE — Telephone Encounter (Signed)
 Called to confirm/remind patient of [facility] on [facility].   Appointment:   [] Confirmed  [x] Left mess   [] No answer/No voice mail  [] VM Full/unable to leave message  [] Phone not in service  Patient reminded to bring all medications and/or complete list.  Confirmed patient has transportation. Gave directions, instructed to utilize valet parking.

## 2023-10-24 NOTE — Progress Notes (Incomplete)
 ADVANCED HF CLINIC NOTE   Primary Cardiologist: Dr. Wonda Primary Care: Tobie Suzzane POUR, MD Nephrologist: Dr. Patience AHF: Dr. Cherrie   Chief complaint: HF  HPI: [name] is a 68 y.o. male, former smoker with HTN, CKD IV, CAD s/p large anterolateral STEMI 9/20 and systolic HF. Has [employer] ICD.   Cath 03/12/2019 Total occlusion of the mid LAD treated with angioplasty and stenting using a 4.0 x 22 Onyx deployed at 14 atm.  0% stenosis was noted post procedure and TIMI grade III flow increased from 0 to 3.  The final angiographic images of the LAD demonstrates apical occlusion due to embolus. Luminal irregularities in the left main but no agreeable stenosis. Large circumflex with 3 obtuse marginals.  The first marginal is large and has a very distal 90% stenosis in 2 branches.  There is a trifurcation distally in the first marginal. RCA Moderate diffuse luminal irregularities but widely patent. Mid anterior wall to apical akinesis.  EF 30 to 35% acutely.  LVEDP 31 mmHg.  Post cath had low BP and shock but recovered. EF 20% by echo. No significant MR. Creatinine post cath 3.1-3.4.  Echo 3/21. EF 20-25%. RV normal.  S/P [employer] ICD implant 08/2019   Echo 6/23 EF 20-25% RV ok  Echo 11/24 EF 32%, RV ok  Today he returns for HF follow up. Overall feeling fine. Drinking a lot of sweet tea & weight is up. He is not SOB walking on flat ground or with ADLs, otherwise not very active. Denies palpitations, abnormal bleeding, CP, dizziness, edema, or PND/Orthopnea. Appetite ok. No fever or chills. Not weighing at home. Taking all medications. BP 110/72 at home.  Review of systems complete and found to be negative unless listed in HPI.    Past Medical History:  Diagnosis Date   Acute ST elevation myocardial infarction (STEMI) due to occlusion of mid portion of left anterior descending (LAD) coronary artery (HCC) 03/12/2019   Acute systolic heart failure (HCC)     AICD (automatic cardioverter/defibrillator) present    Arthritis    CHF (congestive heart failure) (HCC)    CKD (chronic kidney disease) stage 4, GFR 15-29 ml/min (HCC)    Diabetes mellitus, type II (HCC)    Dyspnea    with exertionm periodically   Heart attack (HCC) 03/15/2019   anterior MI    History of kidney stones    Hyperparathyroidism (HCC)    Hypertension 2002   Ischemic cardiomyopathy    Left knee pain    Syncope 04/04/2019   after blood draw   Vitamin D deficiency    Current Outpatient Medications  Medication Sig Dispense Refill   allopurinol  (ZYLOPRIM ) 100 MG tablet Take 100 mg by mouth daily.      [START ON 11/03/2023] ALPRAZolam  (XANAX ) 0.5 MG tablet Take 1 tablet (0.5 mg total) by mouth 2 (two) times daily as needed for anxiety. 60 tablet 2   aspirin  81 MG chewable tablet Chew 1 tablet (81 mg total) by mouth 2 (two) times daily. 60 tablet 0   atorvastatin  (LIPITOR ) 80 MG tablet Take 1 tablet (80 mg total) by mouth daily. 100 tablet 3   BRILINTA  60 MG TABS tablet TAKE (1) TABLET BY MOUTH TWICE DAILY. 180 tablet 3   carvedilol  (COREG ) 12.5 MG tablet TAKE 1 TABLET BY MOUTH TWICE DAILY WITH MEALS 180 tablet 3   cinacalcet  (SENSIPAR ) 30 MG tablet Take 30 mg by mouth daily.     furosemide  (LASIX ) 40 MG  tablet Take 1 tablet (40 mg total) by mouth 3 (three) times a week. Monday, Wednesday and Friday 30 tablet 3   ibuprofen  (ADVIL ) 800 MG tablet Take 800 mg by mouth every 8 (eight) hours as needed.     ivabradine  (CORLANOR ) 5 MG TABS tablet TAKE 1 TABLET BY MOUTH TWICE DAILY WITH A MEAL 60 tablet 11   losartan  (COZAAR ) 25 MG tablet Take 0.5 tablets (12.5 mg total) by mouth daily. 45 tablet 3   nitroGLYCERIN  (NITROSTAT ) 0.4 MG SL tablet Place 1 tablet (0.4 mg total) under the tongue every 5 (five) minutes as needed. 25 tablet 2   Oxycodone  HCl 10 MG TABS Take 1 tablet (10 mg total) by mouth 2 (two) times daily as needed (Severe pain). 60 tablet 0   pantoprazole  (PROTONIX ) 40 MG  tablet Take 1 tablet (40 mg total) by mouth daily before breakfast. 90 tablet 3   No current facility-administered medications for this visit.   No Known Allergies  Social History   Socioeconomic History   Marital status: Single    Spouse name: Not on file   Number of children: 0   Years of education: Not on file   Highest education level: Some college, no degree  Occupational History   Occupation: retired  Tobacco Use   Smoking status: Former    Current packs/day: 0.00    Average packs/day: 0.5 packs/day for 40.0 years (20.0 ttl pk-yrs)    Types: Cigarettes    Start date: 03/12/1979    Quit date: 03/12/2019    Years since quitting: 4.6   Smokeless tobacco: Never  Vaping Use   Vaping status: Never Used  Substance and Sexual Activity   Alcohol use: No   Drug use: No   Sexual activity: Not Currently  Other Topics Concern   Not on file  Social History Narrative   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       Enjoys: fishing, shopping, working around the house       Diet: working on changes, avoiding fried foods, increased veggies   Caffeine: teas some daily   Water : 6-8 cups daily       Wears seat belt    Does not use phone while driving    Psychologist, sport and 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    Social Drivers of Health   Financial Resource Strain: Low Risk  (01/19/2022)   Overall Financial Resource Strain (CARDIA)    Difficulty of Paying Living Expenses: Not hard at all  Food Insecurity: No Food Insecurity (04/03/2022)   Hunger Vital Sign    Worried About Running Out of Food in the Last Year: Never true    Ran Out of Food in the Last Year: Never true  Transportation Needs: No Transportation Needs (02/02/2022)   PRAPARE - Administrator, Civil Service (Medical): No    Lack of Transportation (Non-Medical): No  Physical Activity: Sufficiently Active (01/19/2022)   Exercise Vital Sign    Days of Exercise per Week: 7 days    Minutes of Exercise per Session: 30 min  Stress: No Stress  Concern Present (01/19/2022)   [employer] of Occupational Health - Occupational Stress Questionnaire    Feeling of Stress : Not at all  Social Connections: Moderately Isolated (01/19/2022)   Social Connection and Isolation Panel    Frequency of Communication with Friends and Family: More than three times a week    Frequency of Social Gatherings with Friends and Family: More than three times a  week    Attends Religious Services: More than 4 times per year    Active Member of Clubs or Organizations: No    Attends Banker Meetings: Never    Marital Status: Never married  Intimate Partner Violence: Not At Risk (02/02/2022)   Humiliation, Afraid, Rape, and Kick questionnaire    Fear of Current or Ex-Partner: No    Emotionally Abused: No    Physically Abused: No    Sexually Abused: No   Family History  Problem Relation Age of Onset   Heart failure Mother    Heart disease Mother    Cancer Mother        breast   Heart attack Father    Hypertension Father    Heart disease Father    Diabetes Brother    There were no vitals taken for this visit.  Wt Readings from Last 3 Encounters:  07/27/23 98.9 kg (218 lb)  07/23/23 102.2 kg (225 lb 3.2 oz)  05/11/23 94.9 kg (209 lb 3.2 oz)   PHYSICAL EXAM: General:  NAD. No resp difficulty, walked into clinic HEENT: Normal Neck: Supple. JVP 8-10 Cor: Regular rate & rhythm. No rubs, gallops or murmurs. Lungs: Clear Abdomen: Soft, obese, nontender, nondistended.  Extremities: No cyanosis, clubbing, rash, edema Neuro: Alert & oriented x 3, moves all 4 extremities w/o difficulty. Affect pleasant.  Device interrogation: unable to interrogate device in clinic today  ReDs reading: 36 %, abnormal  ASSESSMENT & PLAN: 1. CAD  - s/p acute anterolateral STEMI 03/12/19 - s/p DES LAD 11/20 - No s/s angina - Continue ASA 81 + Brilinta  - Continue atorvastatin  80 mg  2. Chronic Systolic HF - EF 79%. Following anteriorlateral stemi -  Echo 3/21: EF 20-25%. RV ok.  - Echo 10/22: EF 30-35% in setting of severe anemia - Has [employer] ICD.  - Echo 11/24: EF 32%, RV ok - NYHA II, volume up a bit. ReDs 36%, weight up. - Continue 40 mg MWF - Continue losartan  12.5 mg daily. - Continue Coreg  12.5 mg bid - Continue ivabradine  5 mg bid.  - Not candidate for ARNI, MRA or SGLT2i with CKD IV. - I worry his HF is progressing. Likely not candidate for heart-kidney transplant given age - Labs today.  3. CKD IV - Due to HTN nephropathy - SCr ranges 2.8-3.8 (GFR has ranged 10-20) - Follows with Dr. Hilaria.  - Labs today.  4. HTN - BP elevated in clinic, but just took AM meds - Do not want to drop BP too low with CKD IV. - Consider adding low-dose BiDil  back.  5. Tobacco use - Quit after MI.  - Congratulated on continued cessation.   Toribio Fuel, MD  5:02 PM

## 2023-10-25 ENCOUNTER — Encounter (HOSPITAL_COMMUNITY): Admitting: Internal Medicine

## 2023-11-15 ENCOUNTER — Other Ambulatory Visit: Payer: Self-pay | Admitting: Internal Medicine

## 2023-11-15 ENCOUNTER — Telehealth: Payer: Self-pay

## 2023-11-15 ENCOUNTER — Telehealth: Payer: Self-pay | Admitting: Internal Medicine

## 2023-11-15 DIAGNOSIS — M170 Bilateral primary osteoarthritis of knee: Secondary | ICD-10-CM

## 2023-11-15 DIAGNOSIS — G894 Chronic pain syndrome: Secondary | ICD-10-CM

## 2023-11-15 MED ORDER — OXYCODONE HCL 10 MG PO TABS: 10.0000 mg | ORAL_TABLET | Freq: Two times a day (BID) | ORAL | 0 refills | Status: DC | PRN

## 2023-11-15 NOTE — Telephone Encounter (Signed)
 Left detailed vm informing pt rx was sent

## 2023-11-15 NOTE — Telephone Encounter (Signed)
 Prescription Request  11/15/2023  LOV: 07/27/2023  What is the name of the medication or equipment? Oxycodone  HCl 10 MG TABS [[id number]]    Have you contacted your pharmacy to request a refill? No   Which pharmacy would you like this sent to?  [facility] - [place], [place] - [address] [place] [address] Phone: [phone] Fax: [phone]    Patient notified that their request is being sent to the clinical staff for review and that they should receive a response within 2 business days.   Please advise at [facility] [phone]

## 2023-11-15 NOTE — Telephone Encounter (Signed)
 Copied from CRM [id number]. Topic: Clinical - Medication Question >> Nov 15, 2023  8:54 AM [name] wrote: Reason for CRM: Patient called and asked if his refill for his oxycodone  had been sent over. Let patient know it hadn't been sent yet but I could put in the request for him. Patient insisted on speaking with front desk, specifically Mrs. Dairl. Called over and was told to let the patient know his refill request had been sent back. Informed the patient but he still would like a call back from Mrs. Dairl. Call back number is [phone].

## 2023-11-26 ENCOUNTER — Ambulatory Visit (INDEPENDENT_AMBULATORY_CARE_PROVIDER_SITE_OTHER): Payer: Medicare Other

## 2023-11-26 DIAGNOSIS — I255 Ischemic cardiomyopathy: Secondary | ICD-10-CM | POA: Diagnosis not present

## 2023-11-26 LAB — CUP PACEART REMOTE DEVICE CHECK
Battery Remaining Longevity: 132 mo
Battery Remaining Percentage: 93 %
Brady Statistic RV Percent Paced: 0 %
Date Time Interrogation Session: 20250725042700
HighPow Impedance: 77 Ohm
Implantable Lead Connection Status: 753985
Implantable Lead Implant Date: 20210422
Implantable Lead Location: 753860
Implantable Lead Model: 138
Implantable Lead Serial Number: 302700
Implantable Pulse Generator Implant Date: 20210422
Lead Channel Impedance Value: 447 Ohm
Lead Channel Setting Pacing Amplitude: 2.5 V
Lead Channel Setting Pacing Pulse Width: 0.4 ms
Lead Channel Setting Sensing Sensitivity: 0.5 mV
Pulse Gen Serial Number: 209495
Zone Setting Status: 755011

## 2023-11-27 ENCOUNTER — Other Ambulatory Visit (HOSPITAL_COMMUNITY): Payer: Self-pay | Admitting: Internal Medicine

## 2023-11-29 ENCOUNTER — Ambulatory Visit: Payer: Self-pay | Admitting: Internal Medicine

## 2023-12-09 ENCOUNTER — Ambulatory Visit: Admitting: Internal Medicine

## 2023-12-09 ENCOUNTER — Telehealth: Payer: Self-pay | Admitting: Internal Medicine

## 2023-12-09 ENCOUNTER — Other Ambulatory Visit: Payer: Self-pay | Admitting: Internal Medicine

## 2023-12-09 DIAGNOSIS — M170 Bilateral primary osteoarthritis of knee: Secondary | ICD-10-CM

## 2023-12-09 DIAGNOSIS — G894 Chronic pain syndrome: Secondary | ICD-10-CM

## 2023-12-09 MED ORDER — OXYCODONE HCL 10 MG PO TABS
10.0000 mg | ORAL_TABLET | Freq: Two times a day (BID) | ORAL | 0 refills | Status: DC | PRN
Start: 2023-12-12 — End: 2024-01-10

## 2023-12-09 NOTE — Telephone Encounter (Signed)
 Noted

## 2023-12-09 NOTE — Telephone Encounter (Signed)
 Called patient unable to speak with him, rescheduled appointment and mailed out a reminder appointment letter.

## 2023-12-09 NOTE — Telephone Encounter (Signed)
 Prescription Request  12/09/2023  LOV: 07/27/2023  What is the name of the medication or equipment? Oxycodone  HCl 10 MG TABS [[id number]]   Have you contacted your pharmacy to request a refill? Yes   Which pharmacy would you like this sent to?  [facility] - [place], [place] - [address] [place] [address] Phone: [phone] Fax: [phone]    Patient notified that their request is being sent to the clinical staff for review and that they should receive a response within 2 business days.   Please advise at walked into the office

## 2023-12-24 ENCOUNTER — Telehealth: Payer: Self-pay | Admitting: Internal Medicine

## 2023-12-24 ENCOUNTER — Ambulatory Visit: Payer: Self-pay

## 2023-12-24 NOTE — Telephone Encounter (Signed)
 Third attempt: call cannot be completed as dialed.  Call routed to clinical pool at [facility]  Copied from CRM [id number]. Topic: Clinical - Medication Question >> Dec 24, 2023  9:44 AM Melissa C wrote: Reason for CRM: patient called about Oxycodone  HCl 10 MG TABS and ALPRAZolam  (XANAX ) 0.5 MG tablet prescriptions. He immediately wanted to speak with someone specific at the office. I tried to assist him and eventually called [facility]. They knew who I was talking about and stated the person he is requesting doesn't work there anymore, patient has been by the office and they have told him this information. I let the patient know this and he finally gave me the information for his prescription need. He was confused about when they were last filled. I let him know the pharmacy last filled Xanax  on 7/2 and Oxycodone  on 8/10 and he didn't realize they were filled at that time. I suggested he check with the pharmacy and he thought so too. He stated he was going to go by the pharmacy and he might need to call back  Normally I would resolve this since the patient is going elsewhere but I have a feeling the patient will call back

## 2023-12-24 NOTE — Telephone Encounter (Signed)
   First attempt: phone rang twice, but no one answered  Copied from CRM #[id number]. Topic: Clinical - Medication Question >> Dec 24, 2023  9:44 AM Melissa C wrote: Reason for CRM: patient called about Oxycodone  HCl 10 MG TABS and ALPRAZolam  (XANAX ) 0.5 MG tablet prescriptions. He immediately wanted to speak with someone specific at the office. I tried to assist him and eventually called [facility]. They knew who I was talking about and stated the person he is requesting doesn't work there anymore, patient has been by the office and they have told him this information. I let the patient know this and he finally gave me the information for his prescription need. He was confused about when they were last filled. I let him know the pharmacy last filled Xanax  on 7/2 and Oxycodone  on 8/10 and he didn't realize they were filled at that time. I suggested he check with the pharmacy and he thought so too. He stated he was going to go by the pharmacy and he might need to call back  Normally I would resolve this since the patient is going elsewhere but I have a feeling the patient will call back

## 2023-12-24 NOTE — Telephone Encounter (Signed)
 Second attempt: Attempted to reach patient at both phone number listed.  Received message that call cannot be completed as dialed, and call cannot be completed at this time  Copied from CRM #[id number]. Topic: Clinical - Medication Question >> Dec 24, 2023  9:44 AM Melissa C wrote: Reason for CRM: patient called about Oxycodone  HCl 10 MG TABS and ALPRAZolam  (XANAX ) 0.5 MG tablet prescriptions. He immediately wanted to speak with someone specific at the office. I tried to assist him and eventually called [facility]. They knew who I was talking about and stated the person he is requesting doesn't work there anymore, patient has been by the office and they have told him this information. I let the patient know this and he finally gave me the information for his prescription need. He was confused about when they were last filled. I let him know the pharmacy last filled Xanax  on 7/2 and Oxycodone  on 8/10 and he didn't realize they were filled at that time. I suggested he check with the pharmacy and he thought so too. He stated he was going to go by the pharmacy and he might need to call back  Normally I would resolve this since the patient is going elsewhere but I have a feeling the patient will call back

## 2023-12-24 NOTE — Telephone Encounter (Signed)
 Prescription Request  12/24/2023  LOV: 07/27/2023  What is the name of the medication or equipment? Oxycodone  HCl 10 MG TABS [[id number]]   ALPRAZolam  (XANAX ) 0.5 MG tablet [[id number]]   Have you contacted your pharmacy to request a refill? Yes   Which pharmacy would you like this sent to?  [facility] - [place], [place] - [address] [place] [address] Phone: [phone] Fax: [phone]    Patient notified that their request is being sent to the clinical staff for review and that they should receive a response within 2 business days.   Please advise at patient walked into the office

## 2023-12-27 ENCOUNTER — Ambulatory Visit: Admitting: Internal Medicine

## 2023-12-27 NOTE — Telephone Encounter (Signed)
Unable to speak to patient

## 2023-12-27 NOTE — Telephone Encounter (Signed)
 He missed his last appt per Tobie and hasn't been seen since March 2025. Will need to keep his next appt before he can receive another med refill

## 2023-12-31 ENCOUNTER — Encounter: Admitting: Internal Medicine

## 2024-01-10 ENCOUNTER — Other Ambulatory Visit: Payer: Self-pay | Admitting: Internal Medicine

## 2024-01-10 DIAGNOSIS — M170 Bilateral primary osteoarthritis of knee: Secondary | ICD-10-CM

## 2024-01-10 DIAGNOSIS — G894 Chronic pain syndrome: Secondary | ICD-10-CM

## 2024-01-10 MED ORDER — OXYCODONE HCL 10 MG PO TABS: 10.0000 mg | ORAL_TABLET | Freq: Two times a day (BID) | ORAL | 0 refills | Status: DC | PRN

## 2024-01-10 NOTE — Telephone Encounter (Signed)
 Patient requesting refill on his Oxycodone . Please advise Thank you

## 2024-01-13 ENCOUNTER — Ambulatory Visit: Admitting: Internal Medicine

## 2024-01-14 ENCOUNTER — Ambulatory Visit: Admitting: Internal Medicine

## 2024-01-14 ENCOUNTER — Telehealth: Payer: Self-pay

## 2024-01-14 NOTE — Telephone Encounter (Signed)
 Copied from CRM [id number]. Topic: General - Other >> Jan 14, 2024  9:45 AM Rosaria BRAVO wrote: Reason for CRM: Pt called and wants to know if PCP wants to draw labs for upcoming appt, please advise.  Best contact: [phone]

## 2024-01-14 NOTE — Telephone Encounter (Signed)
 Copied from CRM [id number]. Topic: Appointments - Appointment Cancel/Reschedule >> Jan 14, 2024  9:41 AM Rosaria BRAVO wrote: Patient/patient representative is calling to cancel or reschedule an appointment. Refer to attachments for appointment information.  Had to reschedule because of family emergency in [facility].

## 2024-01-21 ENCOUNTER — Encounter: Payer: Self-pay | Admitting: Internal Medicine

## 2024-01-21 ENCOUNTER — Ambulatory Visit: Admitting: Internal Medicine

## 2024-01-21 VITALS — BP 138/87 | HR 102 | Ht 72.0 in | Wt 188.0 lb

## 2024-01-21 DIAGNOSIS — M170 Bilateral primary osteoarthritis of knee: Secondary | ICD-10-CM

## 2024-01-21 DIAGNOSIS — E1121 Type 2 diabetes mellitus with diabetic nephropathy: Secondary | ICD-10-CM

## 2024-01-21 DIAGNOSIS — Z2821 Immunization not carried out because of patient refusal: Secondary | ICD-10-CM

## 2024-01-21 DIAGNOSIS — Z23 Encounter for immunization: Secondary | ICD-10-CM

## 2024-01-21 DIAGNOSIS — N184 Chronic kidney disease, stage 4 (severe): Secondary | ICD-10-CM | POA: Diagnosis not present

## 2024-01-21 DIAGNOSIS — E782 Mixed hyperlipidemia: Secondary | ICD-10-CM

## 2024-01-21 DIAGNOSIS — F411 Generalized anxiety disorder: Secondary | ICD-10-CM

## 2024-01-21 DIAGNOSIS — G894 Chronic pain syndrome: Secondary | ICD-10-CM

## 2024-01-21 MED ORDER — ALPRAZOLAM 0.5 MG PO TABS: 0.5000 mg | ORAL_TABLET | Freq: Three times a day (TID) | ORAL | 2 refills | Status: DC | PRN

## 2024-01-21 MED ORDER — SPIKEVAX 50 MCG/0.5ML IM SUSY: 0.5000 mL | PREFILLED_SYRINGE | Freq: Once | INTRAMUSCULAR | 0 refills | Status: AC

## 2024-01-21 MED ORDER — OXYCODONE HCL 10 MG PO TABS: 10.0000 mg | ORAL_TABLET | Freq: Two times a day (BID) | ORAL | 0 refills | Status: DC | PRN

## 2024-01-21 NOTE — Assessment & Plan Note (Signed)
 Lab Results  Component Value Date   HGBA1C 6.2 (H) 04/09/2023   Diet controlled Have ordered HbA1c, but does not get the test done On statin Would be a good candidate for SGLT2i, but GFR close to 20 now On Telmisartan now for proteinuria

## 2024-01-21 NOTE — Assessment & Plan Note (Signed)
 S/p left TKA Has seen Orthopedic surgery, planning to get right TKA Has had steroid injection in the past Unable to take NSAIDs due to CKD and CAD Refilled Oxycodone for now, advised to take it BID PRN and avoid TID dose -strictly advised to avoid any other illicit substance use

## 2024-01-21 NOTE — Assessment & Plan Note (Signed)
 On atorvastatin 80 mg QD ?

## 2024-01-21 NOTE — Assessment & Plan Note (Signed)
 Progressive CKD stage 4 Follows up with Nephrology, but needs follow up On Sensipar  and sodium bicarb tablets Avoid nephrotoxic agents, including NSAIDs

## 2024-01-21 NOTE — Progress Notes (Signed)
 Established Patient Office Visit  Subjective:  Patient ID: [name], male    DOB: 1955/06/23  Age: 68 y.o. MRN: [id number]  CC:  Chief Complaint  Patient presents with   Pain    Pain f/u reports pain level of 8 out of 10 on his right knee.    Anxiety    4 month f/u     HPI [name] is a 68 y.o. male with past medical history of CAD s/p CABG, ischemic cardiomyopathy, CKD and OA who presents for f/u of his chronic medical conditions.  Chronic pain syndrome: He has chronic bilateral knee pain and takes oxycodone  10 mg twice daily for it.  He has had left TKA and is planned to get right TKA.  He is unable to take NSAIDs due to CKD and CAD.  GAD and insomnia: He was taking care of of his mother, but has been more anxious since her death. He takes Xanax  0.5 mg BID for GAD and insomnia.  He reports recent worsening of insomnia.  Upon discussion, he prefers to avoid any other medicine except Xanax  as he has not done well with them in the past.  Denies SI or HI currently.  He is currently on DAPT and statin for CAD. BP is well-controlled now. He was having dizziness with Coreg  25 mg BID. His dose has been reduced to 12.5 mg BID, which has resolved his dizziness. Takes medications regularly. Patient denies headache, dizziness, chest pain, dyspnea or palpitations.  CKD and anemia: Followed by Dr. Rachele.  Denies any dysuria, hematuria or urinary hesitance or resistance. His GFR was 22 in 03/25.  He takes Sensipar  currently.   Past Medical History:  Diagnosis Date   Acute ST elevation myocardial infarction (STEMI) due to occlusion of mid portion of left anterior descending (LAD) coronary artery (HCC) 03/12/2019   Acute systolic heart failure (HCC)    AICD (automatic cardioverter/defibrillator) present    Arthritis    CHF (congestive heart failure) (HCC)    CKD (chronic kidney disease) stage 4, GFR 15-29 ml/min (HCC)    Diabetes mellitus, type II (HCC)    Dyspnea    with  exertionm periodically   Heart attack (HCC) 03/15/2019   anterior MI    History of kidney stones    Hyperparathyroidism (HCC)    Hypertension 2002   Ischemic cardiomyopathy    Left knee pain    Syncope 04/04/2019   after blood draw   Vitamin D deficiency     Past Surgical History:  Procedure Laterality Date   AMPUTATION TOE Left 1991   2 digit of left foot   BIOPSY  02/26/2021   Procedure: BIOPSY;  Surgeon: Golda Claudis PENNER, MD;  Location: [facility];  Service: Endoscopy;;   CHOLECYSTECTOMY     COLONOSCOPY     COLONOSCOPY WITH PROPOFOL  N/A 02/26/2021   Procedure: COLONOSCOPY WITH PROPOFOL ;  Surgeon: Golda Claudis PENNER, MD;  Location: [facility];  Service: Endoscopy;  Laterality: N/A;   CORONARY/GRAFT ACUTE MI REVASCULARIZATION N/A 03/12/2019   Procedure: Coronary/Graft Acute MI Revascularization;  Surgeon: Claudene Victory ORN, MD;  Location: [facility];  Service: Cardiovascular;  Laterality: N/A;   ESOPHAGOGASTRODUODENOSCOPY (EGD) WITH PROPOFOL  N/A 02/26/2021   Procedure: ESOPHAGOGASTRODUODENOSCOPY (EGD) WITH PROPOFOL ;  Surgeon: Golda Claudis PENNER, MD;  Location: [facility];  Service: Endoscopy;  Laterality: N/A;   ICD IMPLANT N/A 08/24/2019   Procedure: ICD IMPLANT;  Surgeon: Waddell Danelle ORN, MD;  Location: [facility] INVASIVE CV  LAB;  Service: Cardiovascular;  Laterality: N/A;   IR URETERAL STENT LEFT NEW ACCESS W/O SEP NEPHROSTOMY CATH  09/26/2019   LEFT HEART CATH AND CORONARY ANGIOGRAPHY N/A 03/12/2019   Procedure: LEFT HEART CATH AND CORONARY ANGIOGRAPHY;  Surgeon: Claudene Victory ORN, MD;  Location: [facility] IN[facility];  Service: Cardiovascular;  Laterality: N/A;   LITHOTRIPSY Right    NEPHROLITHOTOMY Left 09/26/2019   Procedure: LEFT NEPHROLITHOTOMY PERCUTANEOUS;  Surgeon: Watt Rush, MD;  Location: [facility];  Service: Urology;  Laterality: Left;   TOTAL KNEE ARTHROPLASTY Left 02/02/2022   Procedure: TOTAL KNEE ARTHROPLASTY;  Surgeon: Rubie Kemps, MD;  Location: [facility];  Service:  Orthopedics;  Laterality: Left;    Family History  Problem Relation Age of Onset   Heart failure Mother    Heart disease Mother    Cancer Mother        breast   Heart attack Father    Hypertension Father    Heart disease Father    Diabetes Brother     Social History   Socioeconomic History   Marital status: Single    Spouse name: Not on file   Number of children: 0   Years of education: Not on file   Highest education level: Some college, no degree  Occupational History   Occupation: retired  Tobacco Use   Smoking status: Former    Current packs/day: 0.00    Average packs/day: 0.5 packs/day for 40.0 years (20.0 ttl pk-yrs)    Types: Cigarettes    Start date: 03/12/1979    Quit date: 03/12/2019    Years since quitting: 4.8   Smokeless tobacco: Never  Vaping Use   Vaping status: Never Used  Substance and Sexual Activity   Alcohol use: No   Drug use: No   Sexual activity: Not Currently  Other Topics Concern   Not on file  Social History Narrative   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       Enjoys: fishing, shopping, working around the house       Diet: working on changes, avoiding fried foods, increased veggies   Caffeine: teas some daily   Water : 6-8 cups daily       Wears seat belt    Does not use phone while driving    Psychologist, sport and 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    Social Drivers of Health   Financial Resource Strain: Low Risk  (01/19/2022)   Overall Financial Resource Strain (CARDIA)    Difficulty of Paying Living Expenses: Not hard at all  Food Insecurity: No Food Insecurity (04/03/2022)   Hunger Vital Sign    Worried About Running Out of Food in the Last Year: Never true    Ran Out of Food in the Last Year: Never true  Transportation Needs: No Transportation Needs (02/02/2022)   PRAPARE - Administrator, Civil Service (Medical): No    Lack of Transportation (Non-Medical): No  Physical Activity: Sufficiently Active (01/19/2022)   Exercise Vital Sign    Days  of Exercise per Week: 7 days    Minutes of Exercise per Session: 30 min  Stress: No Stress Concern Present (01/19/2022)   [employer] of Occupational Health - Occupational Stress Questionnaire    Feeling of Stress : Not at all  Social Connections: Moderately Isolated (01/19/2022)   Social Connection and Isolation Panel    Frequency of Communication with Friends and Family: More than three times a week    Frequency of Social Gatherings with  Friends and Family: More than three times a week    Attends Religious Services: More than 4 times per year    Active Member of Clubs or Organizations: No    Attends Banker Meetings: Never    Marital Status: Never married  Intimate Partner Violence: Not At Risk (02/02/2022)   Humiliation, Afraid, Rape, and Kick questionnaire    Fear of Current or Ex-Partner: No    Emotionally Abused: No    Physically Abused: No    Sexually Abused: No    Outpatient Medications Prior to Visit  Medication Sig Dispense Refill   allopurinol  (ZYLOPRIM ) 100 MG tablet Take 100 mg by mouth daily.      aspirin  81 MG chewable tablet Chew 1 tablet (81 mg total) by mouth 2 (two) times daily. 60 tablet 0   atorvastatin  (LIPITOR ) 80 MG tablet Take 1 tablet (80 mg total) by mouth daily. 100 tablet 3   BRILINTA  60 MG TABS tablet TAKE (1) TABLET BY MOUTH TWICE DAILY. 180 tablet 3   carvedilol  (COREG ) 12.5 MG tablet TAKE 1 TABLET BY MOUTH TWICE DAILY WITH MEALS 180 tablet 3   cinacalcet  (SENSIPAR ) 30 MG tablet Take 30 mg by mouth daily.     furosemide  (LASIX ) 40 MG tablet Take 1 tablet (40 mg total) by mouth 3 (three) times a week. Monday, Wednesday and Friday 30 tablet 3   ibuprofen  (ADVIL ) 800 MG tablet Take 800 mg by mouth every 8 (eight) hours as needed.     ivabradine  (CORLANOR ) 5 MG TABS tablet TAKE 1 TABLET BY MOUTH TWICE DAILY WITH A MEAL 60 tablet 11   losartan  (COZAAR ) 25 MG tablet Take 0.5 tablets (12.5 mg total) by mouth daily. 45 tablet 3   nitroGLYCERIN   (NITROSTAT ) 0.4 MG SL tablet Place 1 tablet (0.4 mg total) under the tongue every 5 (five) minutes as needed. 25 tablet 2   pantoprazole  (PROTONIX ) 40 MG tablet Take 1 tablet (40 mg total) by mouth daily before breakfast. 90 tablet 3   ALPRAZolam  (XANAX ) 0.5 MG tablet Take 1 tablet (0.5 mg total) by mouth 2 (two) times daily as needed for anxiety. 60 tablet 2   Oxycodone  HCl 10 MG TABS Take 1 tablet (10 mg total) by mouth 2 (two) times daily as needed (Severe pain). 60 tablet 0   No facility-administered medications prior to visit.    No Known Allergies  ROS Review of Systems  Constitutional:  Negative for chills and fever.  HENT:  Negative for ear discharge and sore throat.   Eyes:  Negative for pain and discharge.  Respiratory:  Negative for cough and shortness of breath.   Cardiovascular:  Negative for chest pain and palpitations.  Gastrointestinal:  Negative for diarrhea, nausea and vomiting.  Endocrine: Negative for polydipsia and polyuria.  Genitourinary:  Negative for dysuria and hematuria.  Musculoskeletal:  Positive for arthralgias and back pain. Negative for neck pain and neck stiffness.  Skin:  Negative for rash.  Neurological:  Negative for dizziness, weakness and numbness.  Psychiatric/Behavioral:  Positive for sleep disturbance. Negative for agitation and behavioral problems. The patient is nervous/anxious.       Objective:    Physical Exam Vitals reviewed.  Constitutional:      General: He is not in acute distress.    Appearance: He is not diaphoretic.  HENT:     Head: Normocephalic and atraumatic.     Right Ear: There is no impacted cerumen.     Nose: Nose normal.  Mouth/Throat:     Mouth: Mucous membranes are moist.  Eyes:     General: No scleral icterus.    Extraocular Movements: Extraocular movements intact.  Cardiovascular:     Rate and Rhythm: Normal rate and regular rhythm.     Heart sounds: Normal heart sounds. No murmur heard. Pulmonary:      Breath sounds: Normal breath sounds. No wheezing or rales.  Musculoskeletal:        General: Tenderness (B/l knee, with mild swelling) present.     Cervical back: Neck supple. No tenderness.     Right lower leg: No edema.     Left lower leg: No edema.  Skin:    General: Skin is warm.     Findings: No rash.  Neurological:     General: No focal deficit present.     Mental Status: He is alert and oriented to person, place, and time.  Psychiatric:        Mood and Affect: Mood is anxious.        Behavior: Behavior is cooperative.     BP 138/87   Pulse (!) 102   Ht 6' (1.829 m)   Wt 188 lb (85.3 kg)   SpO2 99%   BMI 25.50 kg/m  Wt Readings from Last 3 Encounters:  01/21/24 188 lb (85.3 kg)  07/27/23 218 lb (98.9 kg)  07/23/23 225 lb 3.2 oz (102.2 kg)    Lab Results  Component Value Date   TSH 0.993 08/28/2020   Lab Results  Component Value Date   WBC 4.7 02/02/2022   HGB 11.6 (L) 02/02/2022   HCT 35.1 (L) 02/02/2022   MCV 95.1 02/02/2022   PLT 163 02/02/2022   Lab Results  Component Value Date   NA 143 07/23/2023   K 4.2 07/23/2023   CO2 19 (L) 07/23/2023   GLUCOSE 100 (H) 07/23/2023   BUN 39 (H) 07/23/2023   CREATININE 2.99 (H) 07/23/2023   BILITOT 1.4 (H) 02/02/2022   ALKPHOS 95 02/02/2022   AST 19 02/02/2022   ALT 15 02/02/2022   PROT 7.0 02/02/2022   ALBUMIN 3.3 (L) 02/02/2022   CALCIUM  10.2 07/23/2023   ANIONGAP 8 07/23/2023   EGFR 24 02/26/2023   Lab Results  Component Value Date   CHOL 126 08/28/2020   Lab Results  Component Value Date   HDL 52 08/28/2020   Lab Results  Component Value Date   LDLCALC 53 08/28/2020   Lab Results  Component Value Date   TRIG 118 08/28/2020   Lab Results  Component Value Date   CHOLHDL 2.4 08/28/2020   Lab Results  Component Value Date   HGBA1C 6.2 (H) 04/09/2023      Assessment & Plan:   Problem List Items Addressed This Visit       Endocrine   Type 2 diabetes with nephropathy (HCC)   Lab  Results  Component Value Date   HGBA1C 6.2 (H) 04/09/2023   Diet controlled Have ordered HbA1c, but does not get the test done On statin Would be a good candidate for SGLT2i, but GFR close to 20 now On Telmisartan now for proteinuria        Musculoskeletal and Integument   Primary osteoarthritis of both knees   S/p left TKA Has seen Orthopedic surgery, planning to get right TKA Has had steroid injection in the past Unable to take NSAIDs due to CKD and CAD Refilled Oxycodone  for now, advised to take it BID PRN and avoid TID  dose -strictly advised to avoid any other illicit substance use      Relevant Medications   Oxycodone  HCl 10 MG TABS (Start on 02/08/2024)     Genitourinary   Chronic kidney disease, stage IV (severe) (HCC)   Progressive CKD stage 4 Follows up with Nephrology, but needs follow up On Sensipar  and sodium bicarb tablets Avoid nephrotoxic agents, including NSAIDs        Other   Hyperlipidemia   On atorvastatin  80 mg QD      Chronic pain syndrome   From OA of knee and DDD of lumbar spine On oxycodone  10 mg twice daily as needed for severe pain Check urine tox assure in the next visit -has been noncompliant with dosing at times He has been advised that if persistent noncompliance, will stop prescribing controlled substances      Relevant Medications   Oxycodone  HCl 10 MG TABS (Start on 02/08/2024)   GAD (generalized anxiety disorder) - Primary   Uncontrolled, likely due to lack of sleep and grief reaction from his mother's death Has responded well to Xanax  in the past Increased frequency of Xanax  to 0.5 mg TID as needed for now Denies taking any other medicine      Relevant Medications   ALPRAZolam  (XANAX ) 0.5 MG tablet   Other Visit Diagnoses       Refused influenza vaccine         Need for COVID-19 vaccine       Relevant Medications   COVID-19 mRNA vaccine (SPIKEVAX ) syringe        Has taken pneumococcal vaccine at [place],  [place]. Has had Eye exam at Dr Shawnie office.  Meds ordered this encounter  Medications   Oxycodone  HCl 10 MG TABS    Sig: Take 1 tablet (10 mg total) by mouth 2 (two) times daily as needed (Severe pain).    Dispense:  60 tablet    Refill:  0   ALPRAZolam  (XANAX ) 0.5 MG tablet    Sig: Take 1 tablet (0.5 mg total) by mouth 3 (three) times daily as needed for anxiety.    Dispense:  90 tablet    Refill:  2    No more than 3 refills before 02/01/24   COVID-19 mRNA vaccine (SPIKEVAX ) syringe    Sig: Inject 0.5 mLs into the muscle once for 1 dose. Okay to administer Pfizer vaccine if Moderna is not available.    Dispense:  0.5 mL    Refill:  0    Follow-up: Return in about 4 months (around 05/22/2024).    Suzzane MARLA Blanch, MD

## 2024-01-21 NOTE — Assessment & Plan Note (Signed)
 Uncontrolled, likely due to lack of sleep and grief reaction from his mother's death Has responded well to Xanax  in the past Increased frequency of Xanax  to 0.5 mg TID as needed for now Denies taking any other medicine

## 2024-01-21 NOTE — Assessment & Plan Note (Addendum)
 From OA of knee and DDD of lumbar spine On oxycodone  10 mg twice daily as needed for severe pain Check urine tox assure in the next visit -has been noncompliant with dosing at times He has been advised that if persistent noncompliance, will stop prescribing controlled substances

## 2024-01-21 NOTE — Patient Instructions (Addendum)
 Please schedule AWV.  Please continue to take medications as prescribed.  Please continue to follow low carb diet and ambulate as tolerated.

## 2024-01-24 ENCOUNTER — Other Ambulatory Visit: Payer: Self-pay | Admitting: Internal Medicine

## 2024-01-24 DIAGNOSIS — F411 Generalized anxiety disorder: Secondary | ICD-10-CM

## 2024-01-27 ENCOUNTER — Ambulatory Visit: Admitting: Internal Medicine

## 2024-02-03 ENCOUNTER — Encounter: Payer: Self-pay | Admitting: Gastroenterology

## 2024-02-03 NOTE — Progress Notes (Signed)
 Remote ICD Transmission

## 2024-02-14 ENCOUNTER — Encounter: Payer: Self-pay | Admitting: Internal Medicine

## 2024-02-28 ENCOUNTER — Other Ambulatory Visit: Payer: Self-pay | Admitting: Internal Medicine

## 2024-02-28 DIAGNOSIS — G894 Chronic pain syndrome: Secondary | ICD-10-CM

## 2024-02-28 DIAGNOSIS — M170 Bilateral primary osteoarthritis of knee: Secondary | ICD-10-CM

## 2024-02-28 MED ORDER — OXYCODONE HCL 10 MG PO TABS: 10.0000 mg | ORAL_TABLET | Freq: Two times a day (BID) | ORAL | 0 refills | Status: DC | PRN

## 2024-03-07 ENCOUNTER — Other Ambulatory Visit: Payer: Self-pay

## 2024-03-07 ENCOUNTER — Inpatient Hospital Stay (HOSPITAL_COMMUNITY)
Admission: EM | Admit: 2024-03-07 | Discharge: 2024-03-10 | DRG: 291 | Disposition: A | Discharge status: Home / Self Care | Attending: Family Medicine | Admitting: Family Medicine

## 2024-03-07 ENCOUNTER — Emergency Department (HOSPITAL_COMMUNITY)

## 2024-03-07 ENCOUNTER — Encounter (HOSPITAL_COMMUNITY): Payer: Self-pay | Admitting: Emergency Medicine

## 2024-03-07 DIAGNOSIS — Z1152 Encounter for screening for COVID-19: Secondary | ICD-10-CM

## 2024-03-07 DIAGNOSIS — I214 Non-ST elevation (NSTEMI) myocardial infarction: Secondary | ICD-10-CM | POA: Diagnosis not present

## 2024-03-07 DIAGNOSIS — J9601 Acute respiratory failure with hypoxia: Secondary | ICD-10-CM | POA: Diagnosis present

## 2024-03-07 DIAGNOSIS — I509 Heart failure, unspecified: Principal | ICD-10-CM

## 2024-03-07 DIAGNOSIS — I252 Old myocardial infarction: Secondary | ICD-10-CM

## 2024-03-07 DIAGNOSIS — D631 Anemia in chronic kidney disease: Secondary | ICD-10-CM | POA: Diagnosis present

## 2024-03-07 DIAGNOSIS — E785 Hyperlipidemia, unspecified: Secondary | ICD-10-CM | POA: Diagnosis present

## 2024-03-07 DIAGNOSIS — Z833 Family history of diabetes mellitus: Secondary | ICD-10-CM

## 2024-03-07 DIAGNOSIS — Z91148 Patient's other noncompliance with medication regimen for other reason: Secondary | ICD-10-CM

## 2024-03-07 DIAGNOSIS — Z7902 Long term (current) use of antithrombotics/antiplatelets: Secondary | ICD-10-CM

## 2024-03-07 DIAGNOSIS — Z7982 Long term (current) use of aspirin: Secondary | ICD-10-CM

## 2024-03-07 DIAGNOSIS — N179 Acute kidney failure, unspecified: Secondary | ICD-10-CM | POA: Diagnosis present

## 2024-03-07 DIAGNOSIS — Z87891 Personal history of nicotine dependence: Secondary | ICD-10-CM

## 2024-03-07 DIAGNOSIS — I5023 Acute on chronic systolic (congestive) heart failure: Secondary | ICD-10-CM | POA: Diagnosis present

## 2024-03-07 DIAGNOSIS — N184 Chronic kidney disease, stage 4 (severe): Secondary | ICD-10-CM | POA: Diagnosis present

## 2024-03-07 DIAGNOSIS — I472 Ventricular tachycardia, unspecified: Secondary | ICD-10-CM | POA: Diagnosis present

## 2024-03-07 DIAGNOSIS — E1122 Type 2 diabetes mellitus with diabetic chronic kidney disease: Secondary | ICD-10-CM | POA: Diagnosis present

## 2024-03-07 DIAGNOSIS — I255 Ischemic cardiomyopathy: Secondary | ICD-10-CM | POA: Diagnosis present

## 2024-03-07 DIAGNOSIS — Z96652 Presence of left artificial knee joint: Secondary | ICD-10-CM | POA: Diagnosis present

## 2024-03-07 DIAGNOSIS — Z89422 Acquired absence of other left toe(s): Secondary | ICD-10-CM

## 2024-03-07 DIAGNOSIS — K219 Gastro-esophageal reflux disease without esophagitis: Secondary | ICD-10-CM | POA: Diagnosis present

## 2024-03-07 DIAGNOSIS — Z79899 Other long term (current) drug therapy: Secondary | ICD-10-CM

## 2024-03-07 DIAGNOSIS — Z8249 Family history of ischemic heart disease and other diseases of the circulatory system: Secondary | ICD-10-CM

## 2024-03-07 DIAGNOSIS — Z95810 Presence of automatic (implantable) cardiac defibrillator: Secondary | ICD-10-CM

## 2024-03-07 DIAGNOSIS — I130 Hypertensive heart and chronic kidney disease with heart failure and stage 1 through stage 4 chronic kidney disease, or unspecified chronic kidney disease: Principal | ICD-10-CM | POA: Diagnosis present

## 2024-03-07 DIAGNOSIS — Z803 Family history of malignant neoplasm of breast: Secondary | ICD-10-CM

## 2024-03-07 DIAGNOSIS — I2510 Atherosclerotic heart disease of native coronary artery without angina pectoris: Secondary | ICD-10-CM | POA: Diagnosis present

## 2024-03-07 DIAGNOSIS — Z91119 Patient's noncompliance with dietary regimen due to unspecified reason: Secondary | ICD-10-CM

## 2024-03-07 LAB — BASIC METABOLIC PANEL WITH GFR
Anion gap: 12 (ref 5–15)
BUN: 43 mg/dL — ABNORMAL HIGH (ref 8–23)
CO2: 22 mmol/L (ref 22–32)
Calcium: 10.9 mg/dL — ABNORMAL HIGH (ref 8.9–10.3)
Chloride: 105 mmol/L (ref 98–111)
Creatinine, Ser: 3.49 mg/dL — ABNORMAL HIGH (ref 0.61–1.24)
GFR, Estimated: 19 mL/min — ABNORMAL LOW (ref >60)
Glucose, Bld: 123 mg/dL — ABNORMAL HIGH (ref 70–99)
Potassium: 4.9 mmol/L (ref 3.5–5.1)
Sodium: 139 mmol/L (ref 135–145)

## 2024-03-07 LAB — CBC
HCT: 38.8 % — ABNORMAL LOW (ref 39.0–52.0)
Hemoglobin: 12.9 g/dL — ABNORMAL LOW (ref 13.0–17.0)
MCH: 31.4 pg (ref 26.0–34.0)
MCHC: 33.2 g/dL (ref 30.0–36.0)
MCV: 94.4 fL (ref 80.0–100.0)
Platelets: 184 K/uL (ref 150–400)
RBC: 4.11 MIL/uL — ABNORMAL LOW (ref 4.22–5.81)
RDW: 14.3 % (ref 11.5–15.5)
WBC: 7.3 K/uL (ref 4.0–10.5)
nRBC: 0 % (ref 0.0–0.2)

## 2024-03-07 LAB — HEPATIC FUNCTION PANEL
ALT: 7 U/L (ref 0–44)
AST: 11 U/L — ABNORMAL LOW (ref 15–41)
Albumin: 3.5 g/dL (ref 3.5–5.0)
Alkaline Phosphatase: 82 U/L (ref 38–126)
Bilirubin, Direct: 0.5 mg/dL — ABNORMAL HIGH (ref 0.0–0.2)
Indirect Bilirubin: 0.7 mg/dL (ref 0.3–0.9)
Total Bilirubin: 1.2 mg/dL (ref 0.0–1.2)
Total Protein: 6.9 g/dL (ref 6.5–8.1)

## 2024-03-07 LAB — TROPONIN T, HIGH SENSITIVITY: Troponin T High Sensitivity: 101 ng/L (ref 0–19)

## 2024-03-07 LAB — PRO BRAIN NATRIURETIC PEPTIDE: Pro Brain Natriuretic Peptide: 22315 pg/mL — ABNORMAL HIGH (ref <300.0)

## 2024-03-07 NOTE — Progress Notes (Signed)
 [name]                                          MRN: [id number]   03/07/2024   The [facility]BCI Quality Team Specialist reviewed this patient medical record for the purposes of chart review for care gap closure. The following were reviewed: chart review for care gap closure-kidney health evaluation for diabetes:eGFR  and uACR.    VBCI Quality Team

## 2024-03-07 NOTE — ED Triage Notes (Addendum)
 Pt c/o shortness of breath and generalized chest pain since yesterday. Pt also c/o bilateral feet swelling.   Pt states he takes lasix  on MWF and he forgot to take his dose yesterday.

## 2024-03-08 ENCOUNTER — Inpatient Hospital Stay (HOSPITAL_COMMUNITY)

## 2024-03-08 DIAGNOSIS — E1122 Type 2 diabetes mellitus with diabetic chronic kidney disease: Secondary | ICD-10-CM | POA: Diagnosis present

## 2024-03-08 DIAGNOSIS — I10 Essential (primary) hypertension: Secondary | ICD-10-CM

## 2024-03-08 DIAGNOSIS — I2510 Atherosclerotic heart disease of native coronary artery without angina pectoris: Secondary | ICD-10-CM

## 2024-03-08 DIAGNOSIS — K219 Gastro-esophageal reflux disease without esophagitis: Secondary | ICD-10-CM

## 2024-03-08 DIAGNOSIS — J9601 Acute respiratory failure with hypoxia: Secondary | ICD-10-CM

## 2024-03-08 DIAGNOSIS — N179 Acute kidney failure, unspecified: Secondary | ICD-10-CM | POA: Diagnosis present

## 2024-03-08 DIAGNOSIS — I5021 Acute systolic (congestive) heart failure: Secondary | ICD-10-CM

## 2024-03-08 DIAGNOSIS — I509 Heart failure, unspecified: Secondary | ICD-10-CM

## 2024-03-08 DIAGNOSIS — I472 Ventricular tachycardia, unspecified: Secondary | ICD-10-CM | POA: Diagnosis present

## 2024-03-08 DIAGNOSIS — N184 Chronic kidney disease, stage 4 (severe): Secondary | ICD-10-CM

## 2024-03-08 DIAGNOSIS — R7989 Other specified abnormal findings of blood chemistry: Secondary | ICD-10-CM

## 2024-03-08 DIAGNOSIS — Z79899 Other long term (current) drug therapy: Secondary | ICD-10-CM | POA: Diagnosis not present

## 2024-03-08 DIAGNOSIS — Z8249 Family history of ischemic heart disease and other diseases of the circulatory system: Secondary | ICD-10-CM | POA: Diagnosis not present

## 2024-03-08 DIAGNOSIS — D631 Anemia in chronic kidney disease: Secondary | ICD-10-CM | POA: Diagnosis present

## 2024-03-08 DIAGNOSIS — Z95810 Presence of automatic (implantable) cardiac defibrillator: Secondary | ICD-10-CM | POA: Diagnosis not present

## 2024-03-08 DIAGNOSIS — Z833 Family history of diabetes mellitus: Secondary | ICD-10-CM | POA: Diagnosis not present

## 2024-03-08 DIAGNOSIS — Z1152 Encounter for screening for COVID-19: Secondary | ICD-10-CM | POA: Diagnosis not present

## 2024-03-08 DIAGNOSIS — E785 Hyperlipidemia, unspecified: Secondary | ICD-10-CM | POA: Diagnosis present

## 2024-03-08 DIAGNOSIS — Z803 Family history of malignant neoplasm of breast: Secondary | ICD-10-CM | POA: Diagnosis not present

## 2024-03-08 DIAGNOSIS — Z96652 Presence of left artificial knee joint: Secondary | ICD-10-CM | POA: Diagnosis present

## 2024-03-08 DIAGNOSIS — I5023 Acute on chronic systolic (congestive) heart failure: Secondary | ICD-10-CM | POA: Diagnosis present

## 2024-03-08 DIAGNOSIS — Z89422 Acquired absence of other left toe(s): Secondary | ICD-10-CM | POA: Diagnosis not present

## 2024-03-08 DIAGNOSIS — Z7902 Long term (current) use of antithrombotics/antiplatelets: Secondary | ICD-10-CM | POA: Diagnosis not present

## 2024-03-08 DIAGNOSIS — I252 Old myocardial infarction: Secondary | ICD-10-CM | POA: Diagnosis not present

## 2024-03-08 DIAGNOSIS — I214 Non-ST elevation (NSTEMI) myocardial infarction: Secondary | ICD-10-CM | POA: Diagnosis present

## 2024-03-08 DIAGNOSIS — Z87891 Personal history of nicotine dependence: Secondary | ICD-10-CM | POA: Diagnosis not present

## 2024-03-08 DIAGNOSIS — I130 Hypertensive heart and chronic kidney disease with heart failure and stage 1 through stage 4 chronic kidney disease, or unspecified chronic kidney disease: Secondary | ICD-10-CM | POA: Diagnosis present

## 2024-03-08 DIAGNOSIS — Z7982 Long term (current) use of aspirin: Secondary | ICD-10-CM | POA: Diagnosis not present

## 2024-03-08 DIAGNOSIS — I255 Ischemic cardiomyopathy: Secondary | ICD-10-CM | POA: Diagnosis present

## 2024-03-08 LAB — HIV ANTIBODY (ROUTINE TESTING W REFLEX): HIV Screen 4th Generation wRfx: NONREACTIVE

## 2024-03-08 LAB — CREATININE, SERUM
Creatinine, Ser: 3.24 mg/dL — ABNORMAL HIGH (ref 0.61–1.24)
GFR, Estimated: 20 mL/min — ABNORMAL LOW (ref >60)

## 2024-03-08 LAB — CBC
HCT: 36.5 % — ABNORMAL LOW (ref 39.0–52.0)
Hemoglobin: 12.4 g/dL — ABNORMAL LOW (ref 13.0–17.0)
MCH: 31.2 pg (ref 26.0–34.0)
MCHC: 34 g/dL (ref 30.0–36.0)
MCV: 91.9 fL (ref 80.0–100.0)
Platelets: 171 K/uL (ref 150–400)
RBC: 3.97 MIL/uL — ABNORMAL LOW (ref 4.22–5.81)
RDW: 13.9 % (ref 11.5–15.5)
WBC: 9.5 K/uL (ref 4.0–10.5)
nRBC: 0 % (ref 0.0–0.2)

## 2024-03-08 LAB — ECHOCARDIOGRAM COMPLETE
AR max vel: 3.15 cm2
AV Area VTI: 3.24 cm2
AV Area mean vel: 3.19 cm2
AV Mean grad: 1 mmHg
AV Peak grad: 3.3 mmHg
Ao pk vel: 0.91 m/s
Area-P 1/2: 6.17 cm2
Calc EF: 19.5 %
Height: 72 in
MV M vel: 4.91 m/s
MV Peak grad: 96.4 mmHg
MV VTI: 1.65 cm2
Radius: 1.6 cm
Single Plane A2C EF: 18.3 %
Single Plane A4C EF: 23.2 %
Weight: 3008.84 [oz_av]

## 2024-03-08 LAB — RESP PANEL BY RT-PCR (RSV, FLU A&B, COVID)  RVPGX2
Influenza A by PCR: NEGATIVE
Influenza B by PCR: NEGATIVE
Resp Syncytial Virus by PCR: NEGATIVE
SARS Coronavirus 2 by RT PCR: NEGATIVE

## 2024-03-08 LAB — TROPONIN T, HIGH SENSITIVITY: Troponin T High Sensitivity: 90 ng/L — ABNORMAL HIGH (ref 0–19)

## 2024-03-08 MED ORDER — ALLOPURINOL 100 MG PO TABS
100.0000 mg | ORAL_TABLET | Freq: Every day | ORAL | Status: DC
  Administered 2024-03-08 – 2024-03-10 (×3): 100 mg via ORAL
  Filled 2024-03-08 (×3): qty 1

## 2024-03-08 MED ORDER — ONDANSETRON HCL 4 MG/2ML IJ SOLN: 4.0000 mg | Freq: Four times a day (QID) | INTRAMUSCULAR | Status: DC | PRN

## 2024-03-08 MED ORDER — ATORVASTATIN CALCIUM 40 MG PO TABS
80.0000 mg | ORAL_TABLET | Freq: Every day | ORAL | Status: DC
  Administered 2024-03-08 – 2024-03-10 (×3): 80 mg via ORAL
  Filled 2024-03-08 (×3): qty 2

## 2024-03-08 MED ORDER — IVABRADINE HCL 5 MG PO TABS
5.0000 mg | ORAL_TABLET | Freq: Two times a day (BID) | ORAL | Status: DC
  Administered 2024-03-08 – 2024-03-10 (×4): 5 mg via ORAL
  Filled 2024-03-08 (×7): qty 1

## 2024-03-08 MED ORDER — CINACALCET HCL 30 MG PO TABS
30.0000 mg | ORAL_TABLET | Freq: Every day | ORAL | Status: DC
  Administered 2024-03-08 – 2024-03-10 (×3): 30 mg via ORAL
  Filled 2024-03-08 (×3): qty 1

## 2024-03-08 MED ORDER — PERFLUTREN LIPID MICROSPHERE
1.0000 mL | INTRAVENOUS | Status: AC | PRN
  Administered 2024-03-08: 5 mL via INTRAVENOUS

## 2024-03-08 MED ORDER — LOSARTAN POTASSIUM 25 MG PO TABS
12.5000 mg | ORAL_TABLET | Freq: Every day | ORAL | Status: DC
  Administered 2024-03-08 – 2024-03-10 (×3): 12.5 mg via ORAL
  Filled 2024-03-08 (×3): qty 1

## 2024-03-08 MED ORDER — ACETAMINOPHEN 650 MG RE SUPP: 650.0000 mg | Freq: Four times a day (QID) | RECTAL | Status: DC | PRN

## 2024-03-08 MED ORDER — NITROGLYCERIN 0.4 MG SL SUBL: 0.4000 mg | SUBLINGUAL_TABLET | SUBLINGUAL | Status: DC | PRN

## 2024-03-08 MED ORDER — CARVEDILOL 12.5 MG PO TABS: 12.5000 mg | ORAL_TABLET | Freq: Two times a day (BID) | ORAL | Status: DC

## 2024-03-08 MED ORDER — CARVEDILOL 3.125 MG PO TABS
6.2500 mg | ORAL_TABLET | Freq: Two times a day (BID) | ORAL | Status: DC
  Administered 2024-03-08 – 2024-03-09 (×3): 6.25 mg via ORAL
  Filled 2024-03-08 (×3): qty 2

## 2024-03-08 MED ORDER — FUROSEMIDE 10 MG/ML IJ SOLN
40.0000 mg | Freq: Once | INTRAMUSCULAR | Status: AC
  Administered 2024-03-08: 40 mg via INTRAVENOUS
  Filled 2024-03-08: qty 4

## 2024-03-08 MED ORDER — PANTOPRAZOLE SODIUM 40 MG PO TBEC
40.0000 mg | DELAYED_RELEASE_TABLET | Freq: Every day | ORAL | Status: DC
  Administered 2024-03-09 – 2024-03-10 (×2): 40 mg via ORAL
  Filled 2024-03-08 (×2): qty 1

## 2024-03-08 MED ORDER — ENOXAPARIN SODIUM 30 MG/0.3ML IJ SOSY
30.0000 mg | PREFILLED_SYRINGE | INTRAMUSCULAR | Status: DC
  Administered 2024-03-08 – 2024-03-09 (×2): 30 mg via SUBCUTANEOUS
  Filled 2024-03-08 (×2): qty 0.3

## 2024-03-08 MED ORDER — FUROSEMIDE 10 MG/ML IJ SOLN
40.0000 mg | Freq: Two times a day (BID) | INTRAMUSCULAR | Status: DC
  Administered 2024-03-08 – 2024-03-10 (×5): 40 mg via INTRAVENOUS
  Filled 2024-03-08 (×5): qty 4

## 2024-03-08 MED ORDER — TICAGRELOR 60 MG PO TABS
60.0000 mg | ORAL_TABLET | Freq: Two times a day (BID) | ORAL | Status: DC
  Administered 2024-03-08 – 2024-03-10 (×4): 60 mg via ORAL
  Filled 2024-03-08 (×6): qty 1

## 2024-03-08 MED ORDER — OXYCODONE HCL 5 MG PO TABS: 10.0000 mg | ORAL_TABLET | Freq: Two times a day (BID) | ORAL | Status: DC | PRN

## 2024-03-08 MED ORDER — ALPRAZOLAM 0.25 MG PO TABS
0.2500 mg | ORAL_TABLET | Freq: Three times a day (TID) | ORAL | Status: DC | PRN
Start: 2024-03-08 — End: 2024-03-10
  Administered 2024-03-09: 0.25 mg via ORAL
  Filled 2024-03-08: qty 1

## 2024-03-08 MED ORDER — ACETAMINOPHEN 325 MG PO TABS: 650.0000 mg | ORAL_TABLET | Freq: Four times a day (QID) | ORAL | Status: DC | PRN

## 2024-03-08 MED ORDER — ONDANSETRON HCL 4 MG PO TABS: 4.0000 mg | ORAL_TABLET | Freq: Four times a day (QID) | ORAL | Status: DC | PRN

## 2024-03-08 NOTE — TOC CM/SW Note (Signed)
 Transition of Care [facility]) - Inpatient Brief Assessment   Patient Details  Name: [name] MRN: [id number] Date of Birth: Jun 12, 1955  [facility]) CM/SW Contact:    Lucie Lunger, LCSWA Phone Number: 03/08/2024, 8:54 AM   Clinical Narrative: Transition of Care Department [facility]) has reviewed patient and no TOC needs have been identified at this time. We will continue to monitor patient advancement through interdiciplinary progression rounds. If new patient transition needs arise, please place a TOC consult.  Transition of Care Asessment: Insurance and Status: Insurance coverage has been reviewed Patient has primary care physician: Yes Home environment has been reviewed: From home Prior level of function:: Independent Prior/Current Home Services: No current home services Social Drivers of Health Review: SDOH reviewed no interventions necessary Readmission risk has been reviewed: Yes Transition of care needs: no transition of care needs at this time

## 2024-03-08 NOTE — ED Notes (Signed)
 Unsuccessful IV attempt. Another clinician attempting.

## 2024-03-08 NOTE — Progress Notes (Signed)
   03/08/24 2116  BiPAP/CPAP/SIPAP  Reason BIPAP/CPAP not in use Non-compliant   Patient declines BIPAP. States he doesn't use one at home and doesn't want one while here.

## 2024-03-08 NOTE — Progress Notes (Signed)
 Heart Failure Navigator Progress Note  Patient is currently at [facility] room [facility] 11 for CHF. He is a current patient of the Advanced Heart Failure Team Dr. Cherrie.  Patient missed his last scheduled follow up appointment at the HF Clinic - 10/25/23 @ [place].  Attempted to call him remotely on the phone with no answer at this time.  Scheduled his hospital follow-up at the [facility] @ [facility] on 03/17/24 @ 2:00 PM and will include appointment details on is discharge AVS. Navigator available for reassessment of patient and will attempt to call patient back with appointment details.   Charmaine Pines, RN, BSN [facility] Heart Failure Navigator Secure Chat Only

## 2024-03-08 NOTE — Progress Notes (Signed)
 RT removed patient from BIPAP and placed him on 5L O2 via nasal cannula, patient appears to be tolerating well at this time. Patient SATs 94%, HR 112 and RR 26. RT will continue to monitor.

## 2024-03-08 NOTE — ED Notes (Signed)
 Further unsuccessful IV attempts by several clinicians.

## 2024-03-08 NOTE — ED Notes (Addendum)
 Attempted to call report, no answer x 3 [phone]

## 2024-03-08 NOTE — Progress Notes (Signed)
 ASSUMPTION OF CARE NOTE   03/08/2024 11:35 AM  [name] was seen and examined.  The H&P by the admitting provider, orders, imaging was reviewed.  Please see new orders.  Will continue to follow.   Vitals:   03/08/24 0930 03/08/24 0956  BP: (!) 147/111 (!) 116/94  Pulse: (!) 116 (!) 110  Resp: 20 20  Temp:  99 F (37.2 C)  SpO2: 93% 94%    Results for orders placed or performed during the hospital encounter of 03/07/24  Basic metabolic panel   Collection Time: 03/07/24 10:26 PM  Result Value Ref Range   Sodium 139 135 - 145 mmol/L   Potassium 4.9 3.5 - 5.1 mmol/L   Chloride 105 98 - 111 mmol/L   CO2 22 22 - 32 mmol/L   Glucose, Bld 123 (H) 70 - 99 mg/dL   BUN 43 (H) 8 - 23 mg/dL   Creatinine, Ser 6.50 (H) 0.61 - 1.24 mg/dL   Calcium  10.9 (H) 8.9 - 10.3 mg/dL   GFR, Estimated 19 (L) >60 mL/min   Anion gap 12 5 - 15  CBC   Collection Time: 03/07/24 10:26 PM  Result Value Ref Range   WBC 7.3 4.0 - 10.5 K/uL   RBC 4.11 (L) 4.22 - 5.81 MIL/uL   Hemoglobin 12.9 (L) 13.0 - 17.0 g/dL   HCT 61.1 (L) 60.9 - 47.9 %   MCV 94.4 80.0 - 100.0 fL   MCH 31.4 26.0 - 34.0 pg   MCHC 33.2 30.0 - 36.0 g/dL   RDW 85.6 88.4 - 84.4 %   Platelets 184 150 - 400 K/uL   nRBC 0.0 0.0 - 0.2 %  Pro Brain natriuretic peptide   Collection Time: 03/07/24 10:26 PM  Result Value Ref Range   Pro Brain Natriuretic Peptide 22,315.0 (H) <300.0 pg/mL  Hepatic function panel   Collection Time: 03/07/24 10:26 PM  Result Value Ref Range   Total Protein 6.9 6.5 - 8.1 g/dL   Albumin 3.5 3.5 - 5.0 g/dL   AST 11 (L) 15 - 41 U/L   ALT 7 0 - 44 U/L   Alkaline Phosphatase 82 38 - 126 U/L   Total Bilirubin 1.2 0.0 - 1.2 mg/dL   Bilirubin, Direct 0.5 (H) 0.0 - 0.2 mg/dL   Indirect Bilirubin 0.7 0.3 - 0.9 mg/dL  Troponin T, High Sensitivity   Collection Time: 03/07/24 10:26 PM  Result Value Ref Range   Troponin T High Sensitivity 101 (HH) 0 - 19 ng/L  Resp panel by RT-PCR (RSV, Flu A&B, Covid) Anterior  Nasal Swab   Collection Time: 03/07/24 11:28 PM   Specimen: Anterior Nasal Swab  Result Value Ref Range   SARS Coronavirus 2 by RT PCR NEGATIVE NEGATIVE   Influenza A by PCR NEGATIVE NEGATIVE   Influenza B by PCR NEGATIVE NEGATIVE   Resp Syncytial Virus by PCR NEGATIVE NEGATIVE  Troponin T, High Sensitivity   Collection Time: 03/08/24 12:43 AM  Result Value Ref Range   Troponin T High Sensitivity 90 (H) 0 - 19 ng/L  CBC   Collection Time: 03/08/24  7:58 AM  Result Value Ref Range   WBC 9.5 4.0 - 10.5 K/uL   RBC 3.97 (L) 4.22 - 5.81 MIL/uL   Hemoglobin 12.4 (L) 13.0 - 17.0 g/dL   HCT 63.4 (L) 60.9 - 47.9 %   MCV 91.9 80.0 - 100.0 fL   MCH 31.2 26.0 - 34.0 pg   MCHC 34.0 30.0 - 36.0 g/dL  RDW 13.9 11.5 - 15.5 %   Platelets 171 150 - 400 K/uL   nRBC 0.0 0.0 - 0.2 %  Creatinine, serum   Collection Time: 03/08/24  7:58 AM  Result Value Ref Range   Creatinine, Ser 3.24 (H) 0.61 - 1.24 mg/dL   GFR, Estimated 20 (L) >60 mL/min   KYM Louder, MD [facility]   03/07/2024 10:01 PM How to contact the [facility] Attending or Consulting provider 7A - 7P or covering provider during after hours 7P -7A, for this patient?  Check the care team in [facility] and look for a) attending/consulting [facility] provider listed and b) the [facility] team listed Log into [email or web address] and use [place]'s universal password to access. If you do not have the password, please contact the hospital operator. Locate the [facility] provider you are looking for under [facility] and page to a number that you can be directly reached. If you still have difficulty reaching the provider, please page the [facility] (Director on Call) for the Hospitalists listed on amion for assistance.

## 2024-03-08 NOTE — H&P (Signed)
 History and Physical    Patient: [name] DOB: [birth year] DOA: 03/07/2024 DOS: the patient was seen and examined on 03/08/2024 PCP: Tobie Suzzane POUR, MD  Patient coming from: Home  Chief Complaint:  Chief Complaint  Patient presents with   Shortness of Breath   Chest Pain   HPI: [name] is a 68 y.o. male with medical history significant of hypertension, HFrEF, CAD, T2DM, CKD who presents to the emergency department due to 3-day onset of worsening shortness of breath and chest pain, patient complaining of increased leg swelling.  Shortness of breath worsens on exertion, patient also complained of chest discomfort which started about the same time as onset of symptoms.  ED course In the emergency department, patient was tachycardic, tachypneic, BP was 120/96, O2 sat was 95% on room air, but patient became hypoxic after being in the ED for a while, supplemental oxygen at 3 LPM was provided, but he was still struggling, BiPAP was then provided with improved oxygenation and decreased work of breathing.  Workup in the ED showed normocytic anemia.  BMP was normal except for blood glucose of 123, BUN/creatinine 43/3.49 (baseline creatinine 3.  0 to 3.1), calcium  10.9, eGFR 19.  Troponin 101 > 90, proBNP 22,315.  Respiratory panel was negative. Chest x-ray showed bilateral mid and lower lung airspace disease, right greater than left, possibly representing asymmetric edema or infection. IV Lasix  40 mg x 1 was given.  TRH was asked to admit patient     Review of Systems: As mentioned in the history of present illness. All other systems reviewed and are negative. Past Medical History:  Diagnosis Date   Acute ST elevation myocardial infarction (STEMI) due to occlusion of mid portion of left anterior descending (LAD) coronary artery (HCC) 03/12/2019   Acute systolic heart failure (HCC)    AICD (automatic cardioverter/defibrillator) present    Arthritis    CHF (congestive  heart failure) (HCC)    CKD (chronic kidney disease) stage 4, GFR 15-29 ml/min (HCC)    Diabetes mellitus, type II (HCC)    Dyspnea    with exertionm periodically   Heart attack (HCC) 03/15/2019   anterior MI    History of kidney stones    Hyperparathyroidism    Hypertension 2002   Ischemic cardiomyopathy    Left knee pain    Syncope 04/04/2019   after blood draw   Vitamin D deficiency    Past Surgical History:  Procedure Laterality Date   AMPUTATION TOE Left 1991   2 digit of left foot   BIOPSY  02/26/2021   Procedure: BIOPSY;  Surgeon: Golda Claudis PENNER, MD;  [facility];  Service: Endoscopy;;   CHOLECYSTECTOMY     COLONOSCOPY     COLONOSCOPY WITH PROPOFOL  N/A 02/26/2021   Procedure: COLONOSCOPY WITH PROPOFOL ;  Surgeon: Golda Claudis PENNER, MD;  [facility];  Service: Endoscopy;  Laterality: N/A;   CORONARY/GRAFT ACUTE MI REVASCULARIZATION N/A 03/12/2019   Procedure: Coronary/Graft Acute MI Revascularization;  Surgeon: Claudene Victory ORN, MD;  [facility];  Service: Cardiovascular;  Laterality: N/A;   ESOPHAGOGASTRODUODENOSCOPY (EGD) WITH PROPOFOL  N/A 02/26/2021   Procedure: ESOPHAGOGASTRODUODENOSCOPY (EGD) WITH PROPOFOL ;  Surgeon: Golda Claudis PENNER, MD;  [facility];  Service: Endoscopy;  Laterality: N/A;   ICD IMPLANT N/A 08/24/2019   Procedure: ICD IMPLANT;  Surgeon: Waddell Danelle ORN, MD;  Location: [facility] INVASIVE CV LAB;  Service: Cardiovascular;  Laterality: N/A;   IR URETERAL STENT LEFT  NEW ACCESS W/O SEP NEPHROSTOMY CATH  09/26/2019   LEFT HEART CATH AND CORONARY ANGIOGRAPHY N/A 03/12/2019   Procedure: LEFT HEART CATH AND CORONARY ANGIOGRAPHY;  Surgeon: Claudene Victory ORN, MD;  Location: [facility];  Service: Cardiovascular;  Laterality: N/A;   LITHOTRIPSY Right    NEPHROLITHOTOMY Left 09/26/2019   Procedure: LEFT NEPHROLITHOTOMY PERCUTANEOUS;  Surgeon: Watt Rush, MD;  Location: [facility];  Service: Urology;  Laterality: Left;    TOTAL KNEE ARTHROPLASTY Left 02/02/2022   Procedure: TOTAL KNEE ARTHROPLASTY;  Surgeon: Rubie Kemps, MD;  Location: [facility];  Service: Orthopedics;  Laterality: Left;   Social History:  reports that he quit smoking about 4 years ago. His smoking use included cigarettes. He started smoking about 45 years ago. He has a 20 pack-year smoking history. He has never used smokeless tobacco. He reports that he does not drink alcohol and does not use drugs.  No Known Allergies  Family History  Problem Relation Age of Onset   Heart failure Mother    Heart disease Mother    Cancer Mother        breast   Heart attack Father    Hypertension Father    Heart disease Father    Diabetes Brother     Prior to Admission medications   Medication Sig Start Date End Date Taking? Authorizing Provider  allopurinol  (ZYLOPRIM ) 100 MG tablet Take 100 mg by mouth daily.  04/25/19 03/08/24  [provider]  ALPRAZolam  (XANAX ) 0.5 MG tablet Take 1 tablet (0.5 mg total) by mouth 3 (three) times daily as needed for anxiety. 01/21/24   Tobie Suzzane POUR, MD  aspirin  81 MG chewable tablet Chew 1 tablet (81 mg total) by mouth 2 (two) times daily. 02/05/22   Silver Laurell Redbird, PA  atorvastatin  (LIPITOR ) 80 MG tablet Take 1 tablet (80 mg total) by mouth daily. 09/08/23   Tobie Suzzane POUR, MD  BRILINTA  60 MG TABS tablet TAKE (1) TABLET BY MOUTH TWICE DAILY. 03/02/23   Bensimhon, Toribio SAUNDERS, MD  carvedilol  (COREG ) 12.5 MG tablet TAKE 1 TABLET BY MOUTH TWICE DAILY WITH MEALS 03/02/23   Bensimhon, Toribio SAUNDERS, MD  cinacalcet  (SENSIPAR ) 30 MG tablet Take 30 mg by mouth daily. 12/28/20   [provider]  furosemide  (LASIX ) 40 MG tablet Take 1 tablet (40 mg total) by mouth 3 (three) times a week. Monday, Wednesday and Friday 12/01/23   Bensimhon, Toribio SAUNDERS, MD  ibuprofen  (ADVIL ) 800 MG tablet Take 800 mg by mouth every 8 (eight) hours as needed.    [provider]  ivabradine  (CORLANOR ) 5 MG TABS tablet TAKE 1 TABLET BY  MOUTH TWICE DAILY WITH A MEAL 05/11/23   Waddell Danelle ORN, MD  losartan  (COZAAR ) 25 MG tablet Take 0.5 tablets (12.5 mg total) by mouth daily. 07/23/23 01/21/24  Glena Harlene HERO, FNP  nitroGLYCERIN  (NITROSTAT ) 0.4 MG SL tablet Place 1 tablet (0.4 mg total) under the tongue every 5 (five) minutes as needed. 07/23/23   [place], Harlene HERO, FNP  Oxycodone  HCl 10 MG TABS Take 1 tablet (10 mg total) by mouth 2 (two) times daily as needed (Severe pain). 03/06/24   Tobie Suzzane POUR, MD  pantoprazole  (PROTONIX ) 40 MG tablet Take 1 tablet (40 mg total) by mouth daily before breakfast. 03/24/23   Ezzard Sonny RAMAN, PA-C    Physical Exam: Vitals:   03/08/24 0442 03/08/24 0515 03/08/24 0530 03/08/24 0700  BP:  (!) 128/110 (!) 132/101 (!) 130/103  Pulse: (!) 112 ROLLEN)  115 (!) 109 (!) 106  Resp: 16 19 (!) 23 20  Temp:      TempSrc:      SpO2: 95% 95% 93% 96%  Weight:      Height:       General: Ill appearing.  Awake and alert and oriented x3. Not in any acute distress.  HEENT: NCAT.  PERRLA. EOMI. Sclerae anicteric.  Moist mucosal membranes. Neck: Neck supple without lymphadenopathy. No carotid bruits. No masses palpated.  Cardiovascular: Regular rate with normal S1-S2 sounds. No murmurs, rubs or gallops auscultated. No JVD.  Respiratory: Bilateral Rales in lower lobes.  No wheezing.   Abdomen: Soft, nontender, nondistended. Active bowel sounds. No masses or hepatosplenomegaly  Skin: No rashes, lesions, or ulcerations.  Dry, warm to touch. Musculoskeletal: +2 bilateral LE edema.  AICD placement site noted on left side of chest.  2+ dorsalis pedis and radial pulses. Good ROM.  No contractures  Psychiatric: Intact judgment and insight.  Mood appropriate to current condition. Neurologic: No focal neurological deficits. Strength is 5/5 x 4.  CN II - XII grossly intact.  Data Reviewed: EKG personally reviewed showed normal sinus rhythm at rate of 99 bpm  Assessment and Plan: Acute on chronic systolic heart  failure proBNP 22,315 Continue total input/output, daily weights and fluid restriction Continue Lasix  40 x 1 was given in the ED.  Continue IV Lasix  40 twice daily Continue heart healthy diet  3D echocardiogram done on 03/25/2023 showed EF of 32%.  LV demonstrated GH.  Indeterminate LV diastolic parameters.  Echocardiogram in the morning   Acute respiratory failure with hypoxia secondary to above Continue BiPAP with plan to wean patient off this and transition to Echo and to completely wean patient off this as tolerated.  Patient does not use oxygen at baseline  Elevated troponin possibly secondary to type II demand ischemia Troponin 101 > 90, troponin flattened  Acute kidney injury superimposed on CKD 4 BUN/creatinine 43/3.49 (baseline creatinine 3.  0 to 3.1) Renally adjust medications, avoid nephrotoxic agents/dehydration/hypotension  CAD Continue Brilinta , Lipitor   Essential hypertension Continue losartan   GERD Continue Protonix     Advance Care Planning: Full code  Consults: None  Family Communication: None at bedside  Severity of Illness: The appropriate patient status for this patient is INPATIENT. Inpatient status is judged to be reasonable and necessary in order to provide the required intensity of service to ensure the patient's safety. The patient's presenting symptoms, physical exam findings, and initial radiographic and laboratory data in the context of their chronic comorbidities is felt to place them at high risk for further clinical deterioration. Furthermore, it is not anticipated that the patient will be medically stable for discharge from the hospital within 2 midnights of admission.   * I certify that at the point of admission it is my clinical judgment that the patient will require inpatient hospital care spanning beyond 2 midnights from the point of admission due to high intensity of service, high risk for further deterioration and high frequency of surveillance  required.*  Author: Rishard Delange, DO 03/08/2024 7:24 AM  For on call review [email or web address].

## 2024-03-08 NOTE — ED Provider Notes (Addendum)
 [facility] Emergency Department Provider Note MRN:  [id number]  Arrival date & time: 03/08/24     Chief Complaint   Shortness of Breath and Chest Pain   History of Present Illness   [name] is a 68 y.o. year-old male with a history of CAD, CKD, CHF presenting to the ED with chief complaint of shortness of breath and chest pain.  Worsening shortness of breath over the past 3 days with increased swelling in the legs.  Increased energy, dyspnea on exertion, has been also having some chest discomfort during this time.  No fever or cough.  Review of Systems  A thorough review of systems was obtained and all systems are negative except as noted in the HPI and PMH.   Patient's Health History    Past Medical History:  Diagnosis Date   Acute ST elevation myocardial infarction (STEMI) due to occlusion of mid portion of left anterior descending (LAD) coronary artery (HCC) 03/12/2019   Acute systolic heart failure (HCC)    AICD (automatic cardioverter/defibrillator) present    Arthritis    CHF (congestive heart failure) (HCC)    CKD (chronic kidney disease) stage 4, GFR 15-29 ml/min (HCC)    Diabetes mellitus, type II (HCC)    Dyspnea    with exertionm periodically   Heart attack (HCC) 03/15/2019   anterior MI    History of kidney stones    Hyperparathyroidism    Hypertension 2002   Ischemic cardiomyopathy    Left knee pain    Syncope 04/04/2019   after blood draw   Vitamin D deficiency     Past Surgical History:  Procedure Laterality Date   AMPUTATION TOE Left 1991   2 digit of left foot   BIOPSY  02/26/2021   Procedure: BIOPSY;  Surgeon: Golda Claudis PENNER, MD;  [facility];  Service: Endoscopy;;   CHOLECYSTECTOMY     COLONOSCOPY     COLONOSCOPY WITH PROPOFOL  N/A 02/26/2021   Procedure: COLONOSCOPY WITH PROPOFOL ;  Surgeon: Golda Claudis PENNER, MD;  [facility];  Service: Endoscopy;  Laterality: N/A;   CORONARY/GRAFT ACUTE  MI REVASCULARIZATION N/A 03/12/2019   Procedure: Coronary/Graft Acute MI Revascularization;  Surgeon: Claudene Victory ORN, MD;  [facility];  Service: Cardiovascular;  Laterality: N/A;   ESOPHAGOGASTRODUODENOSCOPY (EGD) WITH PROPOFOL  N/A 02/26/2021   Procedure: ESOPHAGOGASTRODUODENOSCOPY (EGD) WITH PROPOFOL ;  Surgeon: Golda Claudis PENNER, MD;  [facility];  Service: Endoscopy;  Laterality: N/A;   ICD IMPLANT N/A 08/24/2019   Procedure: ICD IMPLANT;  Surgeon: Waddell Danelle ORN, MD;  [facility] INVASIVE CV LAB;  Service: Cardiovascular;  Laterality: N/A;   IR URETERAL STENT LEFT NEW ACCESS W/O SEP NEPHROSTOMY CATH  09/26/2019   LEFT HEART CATH AND CORONARY ANGIOGRAPHY N/A 03/12/2019   Procedure: LEFT HEART CATH AND CORONARY ANGIOGRAPHY;  Surgeon: Claudene Victory ORN, MD;  [facility]: [facility];  Service: Cardiovascular;  Laterality: N/A;   LITHOTRIPSY Right    NEPHROLITHOTOMY Left 09/26/2019   Procedure: LEFT NEPHROLITHOTOMY PERCUTANEOUS;  Surgeon: Watt Rush, MD;  Location: [facility];  Service: Urology;  Laterality: Left;   TOTAL KNEE ARTHROPLASTY Left 02/02/2022   Procedure: TOTAL KNEE ARTHROPLASTY;  Surgeon: Rubie Kemps, MD;  Location: [facility];  Service: Orthopedics;  Laterality: Left;    Family History  Problem Relation Age of Onset   Heart failure Mother    Heart disease Mother    Cancer Mother  breast   Heart attack Father    Hypertension Father    Heart disease Father    Diabetes Brother     Social History   Socioeconomic History   Marital status: Single    Spouse name: Not on file   Number of children: 0   Years of education: Not on file   Highest education level: Some college, no degree  Occupational History   Occupation: retired  Tobacco Use   Smoking status: Former    Current packs/day: 0.00    Average packs/day: 0.5 packs/day for 40.0 years (20.0 ttl pk-yrs)    Types: Cigarettes    Start date: 03/12/1979    Quit date: 03/12/2019    Years since  quitting: 4.9   Smokeless tobacco: Never  Vaping Use   Vaping status: Never Used  Substance and Sexual Activity   Alcohol use: No   Drug use: No   Sexual activity: Not Currently  Other Topics Concern   Not on file  Social History Narrative   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       Enjoys: fishing, shopping, working around the house       Diet: working on changes, avoiding fried foods, increased veggies   Caffeine: teas some daily   Water : 6-8 cups daily       Wears seat belt    Does not use phone while driving    Psychologist, sport and 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    Social Drivers of Health   Financial Resource Strain: Low Risk  (01/19/2022)   Overall Financial Resource Strain (CARDIA)    Difficulty of Paying Living Expenses: Not hard at all  Food Insecurity: No Food Insecurity (04/03/2022)   Hunger Vital Sign    Worried About Running Out of Food in the Last Year: Never true    Ran Out of Food in the Last Year: Never true  Transportation Needs: No Transportation Needs (02/02/2022)   PRAPARE - Administrator, Civil Service (Medical): No    Lack of Transportation (Non-Medical): No  Physical Activity: Sufficiently Active (01/19/2022)   Exercise Vital Sign    Days of Exercise per Week: 7 days    Minutes of Exercise per Session: 30 min  Stress: No Stress Concern Present (01/19/2022)   [employer] of Occupational Health - Occupational Stress Questionnaire    Feeling of Stress : Not at all  Social Connections: Moderately Isolated (01/19/2022)   Social Connection and Isolation Panel    Frequency of Communication with Friends and Family: More than three times a week    Frequency of Social Gatherings with Friends and Family: More than three times a week    Attends Religious Services: More than 4 times per year    Active Member of [employer] or Organizations: No    Attends Banker Meetings: Never    Marital Status: Never married  Intimate Partner Violence: Not At Risk (02/02/2022)    Humiliation, Afraid, Rape, and Kick questionnaire    Fear of Current or Ex-Partner: No    Emotionally Abused: No    Physically Abused: No    Sexually Abused: No     Physical Exam   Vitals:   03/08/24 0403 03/08/24 0442  BP:    Pulse: (!) 113 (!) 112  Resp: (!) 21 16  Temp:    SpO2: 95% 95%    CONSTITUTIONAL: Ill-appearing, NAD NEURO/PSYCH:  Alert and oriented x 3, no focal deficits EYES:  eyes equal and reactive ENT/NECK:  no LAD, no JVD CARDIO: Regular rate, well-perfused, normal S1 and S2 PULM:  CTAB no wheezing or rhonchi GI/GU:  non-distended, non-tender MSK/SPINE:  No gross deformities, pitting edema to bilateral lower extremities SKIN:  no rash, atraumatic   *Additional and/or pertinent findings included in MDM below  Diagnostic and Interventional Summary    EKG Interpretation Date/Time:  Tuesday March 07 2024 22:00:53 EST Ventricular Rate:  99 PR Interval:  154 QRS Duration:  90 QT Interval:  350 QTC Calculation: 449 R Axis:   -33  Text Interpretation: Normal sinus rhythm Right atrial enlargement Left axis deviation Inferior infarct (cited on or before 18-Mar-2021) Anterior infarct (cited on or before 18-Mar-2021) Abnormal ECG When compared with ECG of 09-Mar-2023 10:09, Vent. rate has increased BY  36 BPM T wave inversion no longer evident in Inferior leads T wave inversion no longer evident in Anterolateral leads Confirmed by Theadore Sharper [phone]) on 03/07/2024 11:26:36 PM       Labs Reviewed  BASIC METABOLIC PANEL WITH GFR - Abnormal; Notable for the following components:      Result Value   Glucose, Bld 123 (*)    BUN 43 (*)    Creatinine, Ser 3.49 (*)    Calcium  10.9 (*)    GFR, Estimated 19 (*)    All other components within normal limits  CBC - Abnormal; Notable for the following components:   RBC 4.11 (*)    Hemoglobin 12.9 (*)    HCT 38.8 (*)    All other components within normal limits  PRO BRAIN NATRIURETIC PEPTIDE - Abnormal; Notable  for the following components:   Pro Brain Natriuretic Peptide 22,315.0 (*)    All other components within normal limits  HEPATIC FUNCTION PANEL - Abnormal; Notable for the following components:   AST 11 (*)    Bilirubin, Direct 0.5 (*)    All other components within normal limits  TROPONIN T, HIGH SENSITIVITY - Abnormal; Notable for the following components:   Troponin T High Sensitivity 101 (*)    All other components within normal limits  TROPONIN T, HIGH SENSITIVITY - Abnormal; Notable for the following components:   Troponin T High Sensitivity 90 (*)    All other components within normal limits  RESP PANEL BY RT-PCR (RSV, FLU A&B, COVID)  [id number]    DG Chest 2 View  Final Result      Medications  furosemide  (LASIX ) injection 40 mg (40 mg Intravenous Given 03/08/24 0220)     Procedures  /  Critical Care .Critical Care  Performed by: Theadore Sharper HERO, MD Authorized by: Theadore Sharper HERO, MD   Critical care provider statement:    Critical care time (minutes):  80   Critical care was necessary to treat or prevent imminent or life-threatening deterioration of the following conditions:  Respiratory failure   Critical care was time spent personally by me on the following activities:  Development of treatment plan with patient or surrogate, discussions with consultants, evaluation of patient's response to treatment, examination of patient, ordering and review of laboratory studies, ordering and review of radiographic studies, ordering and performing treatments and interventions, pulse oximetry, re-evaluation of patient's condition and review of old charts   ED Course and Medical Decision Making  Initial Impression and Ddx Suspicious for CHF exacerbation.  ACS also considered.  Less likely pneumonia.  Overall doubt PE.  Symmetric lower extremity edema.  Past medical/surgical history that increases complexity of ED encounter: CHF, CKD, CAD  Interpretation of Diagnostics  I personally  reviewed the EKG and my interpretation is as follows: Sinus rhythm, nonspecific findings  Labs reveal worsening renal function, prominently elevated BNP, troponin greater than 100.  Patient Reassessment and Ultimate Disposition/Management     Plan is for hospitalist admission, suspect type II NSTEMI in the setting of CHF.  4 AM update: Patient with worsening shortness of breath, worsening hypoxia in the 70s despite nasal cannula oxygen.  Transition to BiPAP and seems to be improving with this positive pressure.  Still favoring CHF with more acute pulmonary edema.  Hospitalist made aware.  Patient management required discussion with the following services or consulting groups:  Hospitalist Service  Complexity of Problems Addressed Acute illness or injury that poses threat of life of bodily function  Additional Data Reviewed and Analyzed Further history obtained from: Prior labs/imaging results  Additional Factors Impacting ED Encounter Risk Consideration of hospitalization  Ozell HERO. Theadore, MD [facility] Emergency Medicine [facility] [email or web address]  Final Clinical Impressions(s) / ED Diagnoses     ICD-10-CM   1. Acute on chronic congestive heart failure, unspecified heart failure type (HCC)  I50.9     2. NSTEMI (non-ST elevated myocardial infarction) (HCC)  I21.4     3. Acute respiratory failure with hypoxia (HCC)  J96.01       ED Discharge Orders     None        Discharge Instructions Discussed with and Provided to Patient:   Discharge Instructions   None      Theadore Ozell HERO, MD 03/08/24 0006    Theadore Ozell HERO, MD 03/08/24 [phone]

## 2024-03-08 NOTE — Plan of Care (Signed)
  Problem: Education: Goal: Knowledge of General Education information will improve Description: Including pain rating scale, medication(s)/side effects and non-pharmacologic comfort measures Outcome: Progressing   Problem: Clinical Measurements: Goal: Cardiovascular complication will be avoided Outcome: Progressing   Problem: Activity: Goal: Risk for activity intolerance will decrease Outcome: Progressing   Problem: Coping: Goal: Level of anxiety will decrease Outcome: Progressing   Problem: Elimination: Goal: Will not experience complications related to urinary retention Outcome: Progressing

## 2024-03-09 ENCOUNTER — Inpatient Hospital Stay (HOSPITAL_COMMUNITY)

## 2024-03-09 DIAGNOSIS — I10 Essential (primary) hypertension: Secondary | ICD-10-CM | POA: Diagnosis not present

## 2024-03-09 DIAGNOSIS — I2510 Atherosclerotic heart disease of native coronary artery without angina pectoris: Secondary | ICD-10-CM | POA: Diagnosis not present

## 2024-03-09 DIAGNOSIS — E785 Hyperlipidemia, unspecified: Secondary | ICD-10-CM

## 2024-03-09 DIAGNOSIS — I5023 Acute on chronic systolic (congestive) heart failure: Secondary | ICD-10-CM | POA: Diagnosis not present

## 2024-03-09 LAB — RENAL FUNCTION PANEL
Albumin: 3.1 g/dL — ABNORMAL LOW (ref 3.5–5.0)
Anion gap: 13 (ref 5–15)
BUN: 47 mg/dL — ABNORMAL HIGH (ref 8–23)
CO2: 21 mmol/L — ABNORMAL LOW (ref 22–32)
Calcium: 9.9 mg/dL (ref 8.9–10.3)
Chloride: 107 mmol/L (ref 98–111)
Creatinine, Ser: 3.24 mg/dL — ABNORMAL HIGH (ref 0.61–1.24)
GFR, Estimated: 20 mL/min — ABNORMAL LOW (ref >60)
Glucose, Bld: 118 mg/dL — ABNORMAL HIGH (ref 70–99)
Phosphorus: 1.8 mg/dL — ABNORMAL LOW (ref 2.5–4.6)
Potassium: 3.5 mmol/L (ref 3.5–5.1)
Sodium: 140 mmol/L (ref 135–145)

## 2024-03-09 LAB — CBC
HCT: 35.4 % — ABNORMAL LOW (ref 39.0–52.0)
Hemoglobin: 11.9 g/dL — ABNORMAL LOW (ref 13.0–17.0)
MCH: 31.1 pg (ref 26.0–34.0)
MCHC: 33.6 g/dL (ref 30.0–36.0)
MCV: 92.4 fL (ref 80.0–100.0)
Platelets: 175 K/uL (ref 150–400)
RBC: 3.83 MIL/uL — ABNORMAL LOW (ref 4.22–5.81)
RDW: 13.8 % (ref 11.5–15.5)
WBC: 6 K/uL (ref 4.0–10.5)
nRBC: 0 % (ref 0.0–0.2)

## 2024-03-09 LAB — MAGNESIUM: Magnesium: 2 mg/dL (ref 1.7–2.4)

## 2024-03-09 MED ORDER — K PHOS MONO-SOD PHOS DI & MONO 155-852-130 MG PO TABS
250.0000 mg | ORAL_TABLET | Freq: Three times a day (TID) | ORAL | Status: DC
  Administered 2024-03-09 (×2): 250 mg via ORAL
  Filled 2024-03-09 (×2): qty 1

## 2024-03-09 MED ORDER — CARVEDILOL 12.5 MG PO TABS
12.5000 mg | ORAL_TABLET | Freq: Two times a day (BID) | ORAL | Status: DC
  Administered 2024-03-09 – 2024-03-10 (×2): 12.5 mg via ORAL
  Filled 2024-03-09 (×2): qty 1

## 2024-03-09 NOTE — Progress Notes (Addendum)
 1420: RN messaged Dr Vicci regarding patient HR  going up to 140s then quickly back to 80s, Telemetry said patient is in SR to ST, with elevated P waves. RN placed patient on O2 1 L with sats 92-94 %  MD ordered EKG   1456 RN messaged Dr Vicci that EKG showed SR with pvcs and HR is in the 80s. Patient now resting in bed with O2 at 94 % on RA. Copy of EKG at nursing station.  1636: RN asked if Dr vicci and asked if would like Reds Clip completed? MD said yes. Reds clip completed.   1837: RN messaged Dr Vicci that Reds clip value is 40

## 2024-03-09 NOTE — Plan of Care (Signed)
  Problem: Education: Goal: Knowledge of General Education information will improve Description Including pain rating scale, medication(s)/side effects and non-pharmacologic comfort measures Outcome: Progressing   Problem: Health Behavior/Discharge Planning: Goal: Ability to manage health-related needs will improve Outcome: Progressing   Problem: Clinical Measurements: Goal: Ability to maintain clinical measurements within normal limits will improve Outcome: Progressing   Problem: Education: Goal: Ability to demonstrate management of disease process will improve Outcome: Progressing   Problem: Activity: Goal: Capacity to carry out activities will improve Outcome: Progressing

## 2024-03-09 NOTE — Consult Note (Addendum)
 Cardiology Consultation   Patient ID: Siddarth Hsiung MRN: [id number]; DOB: May 15, 1955  Admit date: 03/07/2024 Date of Consult: 03/09/2024  PCP:  Tobie Suzzane POUR, MD   [facility] Providers Cardiologist:  Victory LELON Claudene DOUGLAS, MD (Inactive)  Electrophysiologist:  Danelle Birmingham, MD  { AHF: Dr. Cherrie  Patient Profile: Kase Shughart is a 68 y.o. male with a hx of CAD (s/p STEMI in 03/2019 showing total occlusion of mid LAD treated with DES x 1 and residual 90% OM1 stenosis), chronic HFrEF (EF 20% by echocardiogram in 03/2019, at 32% by repeat echo in 03/2023), [facility] [employer] ICD in place, HTN, HLD and Stage IV CKD who is being seen 03/09/2024 for the evaluation of CHF at the request of Dr. Vicci.  History of Present Illness:  Mr. Geiler is followed by Advanced Heart Failure with most recent office visit being in 07/2023. He reported overall feeling well at that time and denied any recent anginal symptoms. Had been continued on ASA and Brilinta  along with Atorvastatin  80 mg daily in regards to his CAD. For his HFrEF, Lasix  was increased to 40 mg daily for 5 days given his ReDS vest of 36 and then reduce back to 40 mg MWF.  He was switched from Telmisartan to Losartan  and continued on Coreg  12.5 mg twice daily and Corlanor  5 mg twice daily. Was not a candidate for ARNI/MRA/SGLT2 inhibitor given his Stage IV CKD.  He presented to [facility] ED on 03/07/2024 for evaluation of shortness of breath and chest pain for the past 3 days. Oxygen saturations were at 95% on room air but he also became hypoxic while in the ED and required supplemental oxygen. Ultimately required BiPAP as well.  Initial labs showed WBC 7.3, Hgb 12.9, platelets 184, Na+ 139, K+ 4.9 and creatinine 3.49 (previously 2.9 in 07/2023).  AST 11 and ALT 7. Negative for COVID, influenza and RSV. Initial and repeat Hs Troponin T at 101 and 90. proBNP elevated at 22,315.  CXR showed bilateral mid and lower lung airspace  disease. EKG shows normal sinus rhythm, heart rate 99 with right atrial enlargement and LVH with anterior infarct pattern. Overall similar to prior tracings. Echocardiogram yesterday showed LVEF of 20 to 25% with wall motion abnormalities noted. Also noted to have grade 3 diastolic dysfunction, normal RV function, moderately elevated PASP at 50 mmHg and moderate MR.  He was admitted for an acute CHF exacerbation and received 40 mg of IV Lasix  while in the ED and has been started on 40 mg twice daily. He has been continued on Coreg  (dose reduced to 6.25 mg twice daily), Corlanor  5 mg twice daily, Losartan  12.5 mg daily, Atorvastatin  80mg  daily and Brilinta  60 mg twice daily.  Recorded net output of -2.1 L thus far. Weight unchanged at 188 lbs. Repeat labs today show K+ at 3.5 and creatinine is improving, at 3.24.  In talking with the patient today, he reports having dyspnea on exertion and lower extremity edema for several days leading to admission. Would have chest pain when he could not catch his breath but denies any recurrent pain following diuresis. No specific orthopnea or PND. Says that he was skipping Lasix  frequently and would only take approximately once per week due to frequent urination. Also reports dietary indiscretion as he was eating fried fish at [employer] and also going to the [employer] buffet a few times a week. Reports compliance with his other medications except for Lasix .  Past Medical History:  Diagnosis Date  Acute ST elevation myocardial infarction (STEMI) due to occlusion of mid portion of left anterior descending (LAD) coronary artery (HCC) 03/12/2019   Acute systolic heart failure (HCC)    AICD (automatic cardioverter/defibrillator) present    Arthritis    CHF (congestive heart failure) (HCC)    CKD (chronic kidney disease) stage 4, GFR 15-29 ml/min (HCC)    Diabetes mellitus, type II (HCC)    Dyspnea    with exertionm periodically   Heart attack (HCC) 03/15/2019    anterior MI    History of kidney stones    Hyperparathyroidism    Hypertension 2002   Ischemic cardiomyopathy    Left knee pain    Syncope 04/04/2019   after blood draw   Vitamin D deficiency     Past Surgical History:  Procedure Laterality Date   AMPUTATION TOE Left 1991   2 digit of left foot   BIOPSY  02/26/2021   Procedure: BIOPSY;  Surgeon: Golda Claudis PENNER, MD;  Location: [facility];  Service: Endoscopy;;   CHOLECYSTECTOMY     COLONOSCOPY     COLONOSCOPY WITH PROPOFOL  N/A 02/26/2021   Procedure: COLONOSCOPY WITH PROPOFOL ;  Surgeon: Golda Claudis PENNER, MD;  Location: [facility];  Service: Endoscopy;  Laterality: N/A;   CORONARY/GRAFT ACUTE MI REVASCULARIZATION N/A 03/12/2019   Procedure: Coronary/Graft Acute MI Revascularization;  Surgeon: Claudene Victory ORN, MD;  Location: [facility]AS[facility] CV [facility];  Service: Cardiovascular;  Laterality: N/A;   ESOPHAGOGASTRODUODENOSCOPY (EGD) WITH PROPOFOL  N/A 02/26/2021   Procedure: ESOPHAGOGASTRODUODENOSCOPY (EGD) WITH PROPOFOL ;  Surgeon: Golda Claudis PENNER, MD;  Location: [facility];  Service: Endoscopy;  Laterality: N/A;   ICD IMPLANT N/A 08/24/2019   Procedure: ICD IMPLANT;  Surgeon: Waddell Danelle ORN, MD;  Location: [facility] INVASIVE CV LAB;  Service: Cardiovascular;  Laterality: N/A;   IR URETERAL STENT LEFT NEW ACCESS W/O SEP NEPHROSTOMY CATH  09/26/2019   LEFT HEART CATH AND CORONARY ANGIOGRAPHY N/A 03/12/2019   Procedure: LEFT HEART CATH AND CORONARY ANGIOGRAPHY;  Surgeon: Claudene Victory ORN, MD;  Location: [facility] CV LAB;  Service: Cardiovascular;  Laterality: N/A;   LITHOTRIPSY Right    NEPHROLITHOTOMY Left 09/26/2019   Procedure: LEFT NEPHROLITHOTOMY PERCUTANEOUS;  Surgeon: Watt Rush, MD;  Location: [facility];  Service: Urology;  Laterality: Left;   TOTAL KNEE ARTHROPLASTY Left 02/02/2022   Procedure: TOTAL KNEE ARTHROPLASTY;  Surgeon: Rubie Kemps, MD;  Location: [facility];  Service: Orthopedics;  Laterality: Left;     Home Medications:  Prior to  Admission medications   Medication Sig Start Date End Date Taking? Authorizing Provider  allopurinol  (ZYLOPRIM ) 100 MG tablet Take 100 mg by mouth daily.  04/25/19 03/08/24 Yes [provider]  ALPRAZolam  (XANAX ) 0.5 MG tablet Take 1 tablet (0.5 mg total) by mouth 3 (three) times daily as needed for anxiety. 01/21/24  Yes Tobie Suzzane POUR, MD  atorvastatin  (LIPITOR ) 80 MG tablet Take 1 tablet (80 mg total) by mouth daily. 09/08/23  Yes Tobie Suzzane POUR, MD  BRILINTA  60 MG TABS tablet TAKE (1) TABLET BY MOUTH TWICE DAILY. 03/02/23  Yes Bensimhon, Toribio SAUNDERS, MD  carvedilol  (COREG ) 12.5 MG tablet TAKE 1 TABLET BY MOUTH TWICE DAILY WITH MEALS 03/02/23  Yes Bensimhon, Toribio SAUNDERS, MD  cinacalcet  (SENSIPAR ) 30 MG tablet Take 30 mg by mouth daily. 12/28/20  Yes [provider]  furosemide  (LASIX ) 40 MG tablet Take 1 tablet (40 mg total) by mouth 3 (three) times a week. Monday, Wednesday and Friday 12/01/23  Yes  Bensimhon, Toribio SAUNDERS, MD  ivabradine  (CORLANOR ) 5 MG TABS tablet TAKE 1 TABLET BY MOUTH TWICE DAILY WITH A MEAL 05/11/23  Yes Waddell Danelle ORN, MD  nitroGLYCERIN  (NITROSTAT ) 0.4 MG SL tablet Place 1 tablet (0.4 mg total) under the tongue every 5 (five) minutes as needed. 07/23/23  Yes Milford, Harlene HERO, FNP  Oxycodone  HCl 10 MG TABS Take 1 tablet (10 mg total) by mouth 2 (two) times daily as needed (Severe pain). 03/06/24  Yes Tobie Suzzane POUR, MD  pantoprazole  (PROTONIX ) 40 MG tablet Take 1 tablet (40 mg total) by mouth daily before breakfast. 03/24/23  Yes Ezzard Sonny RAMAN, PA-C  losartan  (COZAAR ) 25 MG tablet Take 0.5 tablets (12.5 mg total) by mouth daily. Patient not taking: Reported on 03/08/2024 07/23/23 01/21/24  Glena Harlene HERO, FNP    Scheduled Meds:  allopurinol   100 mg Oral Daily   atorvastatin   80 mg Oral Daily   carvedilol   6.25 mg Oral BID WC   cinacalcet   30 mg Oral Q breakfast   enoxaparin  (LOVENOX ) injection  30 mg Subcutaneous Q24H   furosemide   40 mg Intravenous Q12H    ivabradine   5 mg Oral BID WC   losartan   12.5 mg Oral Daily   pantoprazole   40 mg Oral QAC breakfast   ticagrelor   60 mg Oral BID   Continuous Infusions:  PRN Meds: acetaminophen  **OR** acetaminophen , ALPRAZolam , nitroGLYCERIN , ondansetron  **OR** ondansetron  (ZOFRAN ) IV, oxyCODONE   Allergies:   No Known Allergies  Social History:   Social History   Socioeconomic History   Marital status: Single    Spouse name: Not on file   Number of children: 0   Years of education: Not on file   Highest education level: Some college, no degree  Occupational History   Occupation: retired  Tobacco Use   Smoking status: Former    Current packs/day: 0.00    Average packs/day: 0.5 packs/day for 40.0 years (20.0 ttl pk-yrs)    Types: Cigarettes    Start date: 03/12/1979    Quit date: 03/12/2019    Years since quitting: 4.9   Smokeless tobacco: Never  Vaping Use   Vaping status: Never Used  Substance and Sexual Activity   Alcohol use: No   Drug use: No   Sexual activity: Not Currently  Other Topics Concern   Not on file  Social History Narrative   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       Enjoys: fishing, shopping, working around the house       Diet: working on changes, avoiding fried foods, increased veggies   Caffeine: teas some daily   Water : 6-8 cups daily       Wears seat belt    Does not use phone while driving    Psychologist, sport and 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     Family History:    Family History  Problem Relation Age of Onset   Heart failure Mother    Heart disease Mother    Cancer Mother        breast   Heart attack Father    Hypertension Father    Heart disease Father    Diabetes Brother      ROS:  Please see the history of present illness.   All other ROS reviewed and negative.     Physical Exam/Data: Vitals:   03/09/24 0500 03/09/24 0550 03/09/24 0600 03/09/24 0738  BP:   107/89 (!) 120/91  Pulse:  87 87 94  Resp:  (!) 25  10 19  Temp:   98.2 F (36.8 C) 98.4 F (36.9  C)  TempSrc:   Axillary Oral  SpO2:  98% 98% 94%  Weight: 85.4 kg 85.4 kg    Height:        Intake/Output Summary (Last 24 hours) at 03/09/2024 0913 Last data filed at 03/09/2024 0700 Gross per 24 hour  Intake 360 ml  Output 1875 ml  Net -1515 ml      03/09/2024    5:50 AM 03/09/2024    5:00 AM 03/07/2024    9:56 PM  Last 3 Weights  Weight (lbs) 188 lb 4.4 oz 188 lb 4.4 oz 188 lb 0.8 oz  Weight (kg) 85.4 kg 85.4 kg 85.3 kg     Body mass index is 25.53 kg/m.  General:  Well nourished, well developed male appearing in no acute distress HEENT: normal Neck: no JVD Vascular: No carotid bruits; Distal pulses 2+ bilaterally Cardiac:  normal S1, S2; RRR; no murmur  Lungs: rales along bases. No wheezing or rhonchi.  Abd: soft, nontender, no hepatomegaly  Ext: trace lower extremity edema bilaterally Musculoskeletal:  No deformities, BUE and BLE strength normal and equal Skin: warm and dry  Neuro:  CNs 2-12 intact, no focal abnormalities noted Psych:  Normal affect   EKG:  The EKG was personally reviewed and demonstrates: NSR, HR 99 with right atrial enlargement and LVH with anterior infarct pattern.  Telemetry:  Telemetry was personally reviewed and demonstrates:  NSR, HR in 80's to 90's with occasional PVC's and one episode 3 beats NSVT.   Relevant CV Studies:  Echocardiogram: 03/08/2024 IMPRESSIONS     1. Left ventricular ejection fraction, by estimation, is 20 to 25%. The  left ventricle has severely decreased function. The left ventricle  demonstrates regional wall motion abnormalities (see scoring  diagram/findings for description). The left  ventricular internal cavity size was moderately to severely dilated. Left  ventricular diastolic parameters are consistent with Grade III diastolic  dysfunction (restrictive). Elevated left atrial pressure.   2. No formed LV mural thrombus evident by Definity  contrast.   3. Right ventricular systolic function is normal. The right  ventricular  size is normal. There is moderately elevated pulmonary artery systolic  pressure. The estimated right ventricular systolic pressure is 50.0 mmHg.   4. Left atrial size was mild to moderately dilated.   5. The mitral valve is grossly normal. Moderate mitral valve  regurgitation (PISA calculation not correct).   6. The aortic valve is tricuspid. Aortic valve regurgitation is not  visualized. No aortic stenosis is present. Aortic valve mean gradient  measures 1.0 mmHg.   7. The inferior vena cava is normal in size with <50% respiratory  variability, suggesting right atrial pressure of 8 mmHg.   Comparison(s): Prior images reviewed side by side. LVEF 20-25% with wall  motion abnormalities (similar to prior study 03/2023 per review of  images). Grade 3 diastolic dysfunction. Moderately elevated estimated  RVSP. Moderate mitral regurgitation.   Laboratory Data: High Sensitivity Troponin:  No results for input(s): TROPONINIHS in the last 720 hours.   Chemistry Recent Labs  Lab 03/07/24 2226 03/08/24 0758 03/09/24 0415  NA 139  --  140  K 4.9  --  3.5  CL 105  --  107  CO2 22  --  21*  GLUCOSE 123*  --  118*  BUN 43*  --  47*  CREATININE 3.49* 3.24* 3.24*  CALCIUM  10.9*  --  9.9  MG  --   --  2.0  GFRNONAA 19* 20* 20*  ANIONGAP 12  --  13    Recent Labs  Lab 03/07/24 2226 03/09/24 0415  PROT 6.9  --   ALBUMIN 3.5 3.1*  AST 11*  --   ALT 7  --   ALKPHOS 82  --   BILITOT 1.2  --    Lipids No results for input(s): CHOL, TRIG, HDL, LABVLDL, LDLCALC, CHOLHDL in the last 168 hours.  Hematology Recent Labs  Lab 03/07/24 2226 03/08/24 0758 03/09/24 0415  WBC 7.3 9.5 6.0  RBC 4.11* 3.97* 3.83*  HGB 12.9* 12.4* 11.9*  HCT 38.8* 36.5* 35.4*  MCV 94.4 91.9 92.4  MCH 31.4 31.2 31.1  MCHC 33.2 34.0 33.6  RDW 14.3 13.9 13.8  PLT 184 171 175   Thyroid  No results for input(s): TSH, FREET4 in the last 168 hours.  BNP Recent Labs  Lab  03/07/24 2226  PROBNP 22,315.0*    DDimer No results for input(s): DDIMER in the last 168 hours.  Radiology/Studies:   DG Chest 2 View Result Date: 03/07/2024 EXAM: 2 VIEW(S) XRAY OF THE CHEST 03/07/2024 10:15:00 PM COMPARISON: 02/22/2021 CLINICAL HISTORY: chest pain, shortness of breath FINDINGS: LINES, TUBES AND DEVICES: Left AICD remains in place, unchanged. LUNGS AND PLEURA: Bilateral mid and lower lung airspace disease, right greater than left. Findings could reflect asymmetric edema or infection. No pleural effusion. No pneumothorax. HEART AND MEDIASTINUM: Left AICD remains in place, unchanged. No acute abnormality of the cardiac and mediastinal silhouettes. BONES AND SOFT TISSUES: No acute osseous abnormality. IMPRESSION: 1. Bilateral mid and lower lung airspace disease, right greater than left, possibly representing asymmetric edema or infection. Electronically signed by: Franky Crease MD 03/07/2024 10:26 PM EST RP Workstation: [id number]     Assessment and Plan:  1. Acute on Chronic HFrEF - He has a long-standing cardiomyopathy with EF 20% by echocardiogram in 03/2019, at 32% by repeat echo in 03/2023 and similar results with EF at 20-25% by repeat [place] [address]. - Acute exacerbation likely triggered by noncompliance with Lasix  as he was only taking once per week and dietary indiscretion (consuming food at the [employer] buff[employer] routinely and also having fried fish).  - While he is only receiving IV Lasix  40mg  BID, he reports significant urine output with this. Recorded net output of -2.1 L thus far. Suspect he would require a higher dose given his underlying CKD but can continue for now given improvement in symptoms. Was prescribed Lasix  40mg  MWF prior to admission and the importance of compliance with this was reviewed. Continue Losartan  12.5mg  daily (would defer to Nephrology), Coreg  and Corlanor  5mg  BID. May need to titrate Coreg  to his prior dose of 12.5mg  BID if BP  remains above goal. Not on ARNI/MRA/SGLT2 inhibitor given his Stage IV CKD.  2. CAD/HLD - He previously had a STEMI in 03/2019 with total occlusion of mid LAD treated with DES x 1 and residual 90% OM1 stenosis. Hs Troponin values were elevated at 101 and 90 this admission and most consistent with demand ischemia in the setting of his CHF exacerbation and CKD as compared to ACS. Was having CP on admission but now resolved with diuresis. Not a candidate for cath given his Stage 4 CKD.  - LDL was at 53 in 2022 with no recent FLP on file. Will recheck with AM labs. Continue Atorvastatin  80mg  daily. Also has been continued on long-term Brilinta  60mg  BID.   3. ICD in place - Device was interrogated  in 11/2023 and functioning normally at that time. Did have episodes of NSVT up to 14 beats in duration.   4. HTN - BP has been elevated with diastolic readings in the 90's to low-100's. Given improvement in respiratory status, can titrate Coreg  to his PTA dose of 12.5mg  BID. Remains on Losartan  12.5mg  daily.   5. Stage IV CKD - Baseline creatinine 2.9 - 3.1. At 3.49 on admission and improved to 3.24 today. Continue to follow with diuresis. Followed by Dr. Rachele as an outpatient.    Risk Assessment/Risk Scores:  New York  Heart Association (NYHA) Functional Class NYHA Class III   For questions or updates, please contact [facility] Please consult [email or web address] for contact info under    Signed, Laymon CHRISTELLA Qua, PA-C  03/09/2024 9:13 AM  Attending Note   Patient seen and discussed with PA Qua, I agree with her documentation. 68 yo male history of HTN, CKD IV, CAD with prior anterolateral STEMI with DES to LAD 03/2019, chronic HFrEF, AICD presents with SOB and chest pain. Poor compliance with home diuretic, has not been limiting his home sodium intake.    K 4.9 Cr 3.49 BUN 43 WBC 7.3 Hgb 12.9 Plt 184 proBNP 22315  Trop 101-->90 EKG SR, inferior Qwaves CXR pending 03/2024  echo: LVEF 20-25%, no WMAs, grade III dd, normal RV, mod MR  03/2019 cath: LAD mid and distal occlusion, D1 60%, D2 85%, OM1 85%, RCA mild diffuse disease. DES to LAD.     1.Acute on chronic HFrEF - 10/2021 echo: LVEF 20-25% - 03/2023 echo: LVEF 30%, indet diastolic, normal RV -03/2024 echo: LVEF 20-25%, no WMAs, grade III dd, normal RV, mod MR - exacerbation secondary to poor dietary and diuretic compliance.   CXR pending,proBNP 22315 - received IV lasix  40mg  x 3 doses yesterday, due for bid dosing today. Negaitve 2.1 L thus far, bed weights are unchanged, change order to standing weights. Initial downtrend in Cr with diuresis consistent with venous congestion and HF. Continue IV lasix  today, remains fluid overloaded - medical therapy with coreg  12.5mg  bid, corlanor  5mg  bid, losartan  12.5mg  daily. In general medical therapy due to CKD   2. CAD -  prior anterolateral STEMI with DES to LAD 03/2019 - no acute issues this admission. Some chest pains on admission that have resolved with diuresis, trop mild and flat not consistent with ACS  3. CKD IV - followed by Dr Rachele as outpatient.    Dorn Ross MD

## 2024-03-09 NOTE — Plan of Care (Signed)
   Problem: Education: Goal: Knowledge of General Education information will improve Description: Including pain rating scale, medication(s)/side effects and non-pharmacologic comfort measures Outcome: Progressing   Problem: Clinical Measurements: Goal: Respiratory complications will improve Outcome: Progressing Goal: Cardiovascular complication will be avoided Outcome: Progressing   Problem: Activity: Goal: Risk for activity intolerance will decrease Outcome: Progressing   Problem: Coping: Goal: Level of anxiety will decrease Outcome: Progressing

## 2024-03-09 NOTE — Progress Notes (Signed)
 03/09/2024 3:00 PM  Pt had brief episodes of tachycardia.  EKG ordered 12 lead showing SR with PVCs.  Continue current management.   KYM FREDRIK Louder, MD

## 2024-03-09 NOTE — Progress Notes (Signed)
 PROGRESS NOTE   [name]  FMW:[id number] DOB: [birth year] DOA: 03/07/2024 PCP: Tobie Suzzane POUR, MD   Chief Complaint  Patient presents with   Shortness of Breath   Chest Pain   Level of care: Telemetry  Brief Admission History:  68 y.o. male with medical history significant of hypertension, HFrEF, CAD, T2DM, CKD who presents to the emergency department due to 3-day onset of worsening shortness of breath and chest pain, patient complaining of increased leg swelling.  Shortness of breath worsens on exertion, patient also complained of chest discomfort which started about the same time as onset of symptoms.   In the emergency department, patient was tachycardic, tachypneic, BP was 120/96, O2 sat was 95% on room air, but patient became hypoxic after being in the ED for a while, supplemental oxygen at 3 LPM was provided, but he was still struggling, BiPAP was then provided with improved oxygenation and decreased work of breathing.  Workup in the ED showed normocytic anemia.  BMP was normal except for blood glucose of 123, BUN/creatinine 43/3.49 (baseline creatinine 3.  0 to 3.1), calcium  10.9, eGFR 19.  Troponin 101 > 90, proBNP 22,315.  Respiratory panel was negative.  Chest x-ray showed bilateral mid and lower lung airspace disease, right greater than left, possibly representing asymmetric edema or infection.  IV Lasix  40 mg x 1 was given.  TRH was asked to admit patient   Assessment and Plan:  Acute HFrEF -- LVEF down to 20-25% from 11/24 -- IV furosemide  as ordered  -- monitoring I/O, weight -- ReDs vest reading pending -- CXR pending -- appreciate cardiology team consultation -- plan for ongoing IV furosemide  for diuresis  Acute Resp Failure with hypoxia -- secondary to CHF exacerbation -- improving with diuresis -- follow up CXR pending  AKI on CKD 4 -- creatinine down some with diuresis -- continue to follow daily on IV furosemide   CAD -- no CP symptoms -- resumed his  home brilinta , atorvastatin   Essential hypertension -- soft BPs improved now, resume home BP meds  GERD -- pantoprazole  for GI protection   DVT prophylaxis: enoxaparin   Code Status: Full  Family Communication:  Disposition:     Consultants:  Cardiology  Procedures:   Antimicrobials:    Subjective: Pt still having SOB symptoms and he is urinating frequently on IV furosemide .  He says he has not been taking furosemide  regularly at home prior to arrival.   Objective: Vitals:   03/09/24 0550 03/09/24 0600 03/09/24 0738 03/09/24 0939  BP:  107/89 (!) 120/91   Pulse: 87 87 94 88  Resp: (!) 25 10 19 13   Temp:  98.2 F (36.8 C) 98.4 F (36.9 C)   TempSrc:  Axillary Oral   SpO2: 98% 98% 94% 97%  Weight: 85.4 kg     Height:        Intake/Output Summary (Last 24 hours) at 03/09/2024 1224 Last data filed at 03/09/2024 0700 Gross per 24 hour  Intake 360 ml  Output 1875 ml  Net -1515 ml   Filed Weights   03/07/24 2156 03/09/24 0500 03/09/24 0550  Weight: 85.3 kg 85.4 kg 85.4 kg   Examination:  General exam: Appears calm and comfortable  Respiratory system: crackles heard. Cardiovascular system: normal S1 & S2 heard. No JVD, murmurs, rubs, gallops or clicks. No pedal edema. Gastrointestinal system: Abdomen is nondistended, soft and nontender. No organomegaly or masses felt. Normal bowel sounds heard. Central nervous system: Alert and oriented. No focal neurological deficits. Extremities:  Symmetric 5 x 5 power. Skin: No rashes, lesions or ulcers. Psychiatry: Judgement and insight appear normal. Mood & affect appropriate.   Data Reviewed: I have personally reviewed following labs and imaging studies  CBC: Recent Labs  Lab 03/07/24 2226 03/08/24 0758 03/09/24 0415  WBC 7.3 9.5 6.0  HGB 12.9* 12.4* 11.9*  HCT 38.8* 36.5* 35.4*  MCV 94.4 91.9 92.4  PLT 184 171 175    Basic Metabolic Panel: Recent Labs  Lab 03/07/24 2226 03/08/24 0758 03/09/24 0415  NA 139   --  140  K 4.9  --  3.5  CL 105  --  107  CO2 22  --  21*  GLUCOSE 123*  --  118*  BUN 43*  --  47*  CREATININE 3.49* 3.24* 3.24*  CALCIUM  10.9*  --  9.9  MG  --   --  2.0  PHOS  --   --  1.8*    CBG: No results for input(s): GLUCAP in the last 168 hours.  Recent Results (from the past 240 hours)  Resp panel by RT-PCR (RSV, Flu A&B, Covid) Anterior Nasal Swab     Status: None   Collection Time: 03/07/24 11:28 PM   Specimen: Anterior Nasal Swab  Result Value Ref Range Status   SARS Coronavirus 2 by RT PCR NEGATIVE NEGATIVE Final    Comment: (NOTE) SARS-CoV-2 target nucleic acids are NOT DETECTED.  The SARS-CoV-2 RNA is generally detectable in upper respiratory specimens during the acute phase of infection. The lowest concentration of SARS-CoV-2 viral copies this assay can detect is 138 copies/mL. A negative result does not preclude SARS-Cov-2 infection and should not be used as the sole basis for treatment or other patient management decisions. A negative result may occur with  improper specimen collection/handling, submission of specimen other than nasopharyngeal swab, presence of viral mutation(s) within the areas targeted by this assay, and inadequate number of viral copies(<138 copies/mL). A negative result must be combined with clinical observations, patient history, and epidemiological information. The expected result is Negative.  Fact Sheet for Patients:  bloggercourse.com  Fact Sheet for Healthcare Providers:  seriousbroker.it  This test is no t yet approved or cleared by the [place]  FDA and  has been authorized for detection and/or diagnosis of SARS-CoV-2 by FDA under an Emergency Use Authorization (EUA). This EUA will remain  in effect (meaning this test can be used) for the duration of the COVID-19 declaration under Section 564(b)(1) of the Act, 21 U.S.C.section 360bbb-3(b)(1), unless the authorization  is terminated  or revoked sooner.       Influenza A by PCR NEGATIVE NEGATIVE Final   Influenza B by PCR NEGATIVE NEGATIVE Final    Comment: (NOTE) The Xpert Xpress SARS-CoV-2/FLU/RSV plus assay is intended as an aid in the diagnosis of influenza from Nasopharyngeal swab specimens and should not be used as a sole basis for treatment. Nasal washings and aspirates are unacceptable for Xpert Xpress SARS-CoV-2/FLU/RSV testing.  Fact Sheet for Patients: bloggercourse.com  Fact Sheet for Healthcare Providers: seriousbroker.it  This test is not yet approved or cleared by the [place]  FDA and has been authorized for detection and/or diagnosis of SARS-CoV-2 by FDA under an Emergency Use Authorization (EUA). This EUA will remain in effect (meaning this test can be used) for the duration of the COVID-19 declaration under Section 564(b)(1) of the Act, 21 U.S.C. section 360bbb-3(b)(1), unless the authorization is terminated or revoked.     Resp Syncytial Virus by PCR NEGATIVE NEGATIVE  Final    Comment: (NOTE) Fact Sheet for Patients: bloggercourse.com  Fact Sheet for Healthcare Providers: seriousbroker.it  This test is not yet approved or cleared by the [place]  FDA and has been authorized for detection and/or diagnosis of SARS-CoV-2 by FDA under an Emergency Use Authorization (EUA). This EUA will remain in effect (meaning this test can be used) for the duration of the COVID-19 declaration under Section 564(b)(1) of the Act, 21 U.S.C. section 360bbb-3(b)(1), unless the authorization is terminated or revoked.  Performed at [facility], [address] [place], [place] [address]      Radiology Studies: ECHOCARDIOGRAM COMPLETE Result Date: 03/08/2024    ECHOCARDIOGRAM REPORT   Patient Name:   HERSCHEL FLEAGLE Date of Exam: 03/08/2024 Medical Rec #:  [id number]        Height:        72.0 in Accession #:    [id number]       Weight:       188.1 lb Date of Birth:  1955-05-25        BSA:          2.076 m Patient Age:    68 years         BP:           116/94 mmHg Patient Gender: M                HR:           110 bpm. Exam Location:  [facility] Procedure: 2D Echo, Cardiac Doppler, Color Doppler and Intracardiac            Opacification Agent (Both Spectral and Color Flow Doppler were            utilized during procedure). Indications:    CHF I50.21  History:        Patient has prior history of Echocardiogram examinations, most                 recent 03/25/2023. CHF and Ischemic Cardiomyopathy, STEMI and                 CAD, Defibrillator, Signs/Symptoms:Syncope and Dyspnea; Risk                 Factors:Hypertension, Hyperlipidemia, Diabetes and Former                 Smoker.  Sonographer:    BERNARDA ROCKS Referring Phys: [id number] OLADAPO ADEFESO IMPRESSIONS  1. Left ventricular ejection fraction, by estimation, is 20 to 25%. The left ventricle has severely decreased function. The left ventricle demonstrates regional wall motion abnormalities (see scoring diagram/findings for description). The left ventricular internal cavity size was moderately to severely dilated. Left ventricular diastolic parameters are consistent with Grade III diastolic dysfunction (restrictive). Elevated left atrial pressure.  2. No formed LV mural thrombus evident by Definity  contrast.  3. Right ventricular systolic function is normal. The right ventricular size is normal. There is moderately elevated pulmonary artery systolic pressure. The estimated right ventricular systolic pressure is 50.0 mmHg.  4. Left atrial size was mild to moderately dilated.  5. The mitral valve is grossly normal. Moderate mitral valve regurgitation (PISA calculation not correct).  6. The aortic valve is tricuspid. Aortic valve regurgitation is not visualized. No aortic stenosis is present. Aortic valve mean gradient measures 1.0 mmHg.  7. The  inferior vena cava is normal in size with <50% respiratory variability, suggesting right atrial pressure of 8 mmHg. Comparison(s): Prior images reviewed side  by side. LVEF 20-25% with wall motion abnormalities (similar to prior study 03/2023 per review of images). Grade 3 diastolic dysfunction. Moderately elevated estimated RVSP. Moderate mitral regurgitation. FINDINGS  Left Ventricle: Left ventricular ejection fraction, by estimation, is 20 to 25%. The left ventricle has severely decreased function. The left ventricle demonstrates regional wall motion abnormalities. Definity  contrast agent was given IV to delineate the left ventricular endocardial borders. The left ventricular internal cavity size was moderately to severely dilated. There is no left ventricular hypertrophy. Left ventricular diastolic parameters are consistent with Grade III diastolic dysfunction (restrictive). Elevated left atrial pressure.  LV Wall Scoring: The entire anterior wall, entire septum, and entire apex are akinetic. The antero-lateral wall, inferior wall, and mid inferolateral segment are hypokinetic. The basal inferolateral segment is normal. Right Ventricle: The right ventricular size is normal. No increase in right ventricular wall thickness. Right ventricular systolic function is normal. There is moderately elevated pulmonary artery systolic pressure. The tricuspid regurgitant velocity is 3.24 m/s, and with an assumed right atrial pressure of 8 mmHg, the estimated right ventricular systolic pressure is 50.0 mmHg. Left Atrium: Left atrial size was mild to moderately dilated. Right Atrium: Right atrial size was normal in size. Pericardium: There is no evidence of pericardial effusion. Mitral Valve: The mitral valve is grossly normal. Moderate mitral valve regurgitation. MV peak gradient, 5.8 mmHg. The mean mitral valve gradient is 2.0 mmHg. Tricuspid Valve: The tricuspid valve is grossly normal. Tricuspid valve regurgitation is mild.  Aortic Valve: The aortic valve is tricuspid. There is mild aortic valve annular calcification. Aortic valve regurgitation is not visualized. No aortic stenosis is present. Aortic valve mean gradient measures 1.0 mmHg. Aortic valve peak gradient measures 3.3 mmHg. Aortic valve area, by VTI measures 3.24 cm. Pulmonic Valve: The pulmonic valve was grossly normal. Pulmonic valve regurgitation is trivial. Aorta: The aortic root and ascending aorta are structurally normal, with no evidence of dilitation. Venous: The inferior vena cava is normal in size with less than 50% respiratory variability, suggesting right atrial pressure of 8 mmHg. IAS/Shunts: No atrial level shunt detected by color flow Doppler. Additional Comments: 3D was performed not requiring image post processing on an independent workstation and was indeterminate. A device lead is visualized.  LEFT VENTRICLE PLAX 2D LVIDd:         7.00 cm LV PW:         0.70 cm LV IVS:        0.80 cm LVOT diam:     2.00 cm LV SV:         32 LV SV Index:   15 LVOT Area:     3.14 cm  LV Volumes (MOD) LV vol d, MOD A2C: 312.0 ml LV vol d, MOD A4C: 453.0 ml LV vol s, MOD A2C: 255.0 ml LV vol s, MOD A4C: 348.0 ml LV SV MOD A2C:     57.0 ml LV SV MOD A4C:     453.0 ml LV SV MOD BP:      74.0 ml RIGHT VENTRICLE             IVC RV Basal diam:  3.60 cm     IVC diam: 1.60 cm RV S prime:     22.90 cm/s TAPSE (M-mode): 1.8 cm RVSP:           50.0 mmHg LEFT ATRIUM             Index        RIGHT ATRIUM  Index LA diam:        4.20 cm 2.02 cm/m   RA Pressure: 8.00 mmHg LA Vol (A2C):   95.5 ml 46.01 ml/m  RA Area:     14.20 cm LA Vol (A4C):   69.0 ml 33.24 ml/m  RA Volume:   37.80 ml  18.21 ml/m LA Biplane Vol: 83.4 ml 40.18 ml/m  AORTIC VALVE                    PULMONIC VALVE AV Area (Vmax):    3.15 cm     PV Vmax:          0.78 m/s AV Area (Vmean):   3.19 cm     PV Peak grad:     2.4 mmHg AV Area (VTI):     3.24 cm     PR End Diast Vel: 0.65 msec AV Vmax:            90.70 cm/s AV Vmean:          54.400 cm/s AV VTI:            0.098 m AV Peak Grad:      3.3 mmHg AV Mean Grad:      1.0 mmHg LVOT Vmax:         90.90 cm/s LVOT Vmean:        55.300 cm/s LVOT VTI:          0.101 m LVOT/AV VTI ratio: 1.03  AORTA Ao Root diam: 2.80 cm Ao Asc diam:  3.50 cm MITRAL VALVE                  TRICUSPID VALVE MV Area (PHT): 6.17 cm       TR Peak grad:   42.0 mmHg MV Area VTI:   1.65 cm       TR Vmax:        324.00 cm/s MV Peak grad:  5.8 mmHg       Estimated RAP:  8.00 mmHg MV Mean grad:  2.0 mmHg       RVSP:           50.0 mmHg MV Vmax:       1.20 m/s MV Vmean:      71.4 cm/s      SHUNTS MV Decel Time: 123 msec       Systemic VTI:  0.10 m MR Peak grad:    96.4 mmHg    Systemic Diam: 2.00 cm MR Mean grad:    57.0 mmHg MR Vmax:         491.00 cm/s MR Vmean:        347.0 cm/s MR PISA:         16.08 cm MR PISA Eff ROA: 110 mm MR PISA Radius:  1.60 cm MV E velocity: 117.00 cm/s MV A velocity: 54.40 cm/s MV E/A ratio:  2.15 Jayson Sierras MD Electronically signed by Jayson Sierras MD Signature Date/Time: 03/08/2024/4:35:25 PM    Final    DG Chest 2 View Result Date: 03/07/2024 EXAM: 2 VIEW(S) XRAY OF THE CHEST 03/07/2024 10:15:00 PM COMPARISON: 02/22/2021 CLINICAL HISTORY: chest pain, shortness of breath FINDINGS: LINES, TUBES AND DEVICES: Left AICD remains in place, unchanged. LUNGS AND PLEURA: Bilateral mid and lower lung airspace disease, right greater than left. Findings could reflect asymmetric edema or infection. No pleural effusion. No pneumothorax. HEART AND MEDIASTINUM: Left AICD remains in place, unchanged. No acute abnormality of the cardiac and mediastinal silhouettes.  BONES AND SOFT TISSUES: No acute osseous abnormality. IMPRESSION: 1. Bilateral mid and lower lung airspace disease, right greater than left, possibly representing asymmetric edema or infection. Electronically signed by: Franky Crease MD 03/07/2024 10:26 PM EST RP Workstation: [id number]    Scheduled Meds:   allopurinol   100 mg Oral Daily   atorvastatin   80 mg Oral Daily   carvedilol   12.5 mg Oral BID WC   cinacalcet   30 mg Oral Q breakfast   enoxaparin  (LOVENOX ) injection  30 mg Subcutaneous Q24H   furosemide   40 mg Intravenous Q12H   ivabradine   5 mg Oral BID WC   losartan   12.5 mg Oral Daily   pantoprazole   40 mg Oral QAC breakfast   ticagrelor   60 mg Oral BID   Continuous Infusions:   LOS: 1 day   Time spent: 55 mins  Kamrin Sibley Vicci, MD How to contact the [facility] Attending or Consulting provider 7A - 7P or covering provider during after hours 7P -7A, for this patient?  Check the care team in [facility] and look for a) attending/consulting [facility] provider listed and b) the [facility] team listed Log into [email or web address] to find provider on call.  Locate the [facility] provider you are looking for under [facility] and page to a number that you can be directly reached. If you still have difficulty reaching the provider, please page the [facility] (Director on Call) for the Hospitalists listed on amion for assistance.  03/09/2024, 12:24 PM

## 2024-03-09 NOTE — Hospital Course (Signed)
 67 y.o. male with medical history significant of hypertension, HFrEF, CAD, T2DM, CKD who presents to the emergency department due to 3-day onset of worsening shortness of breath and chest pain, patient complaining of increased leg swelling.  Shortness of breath worsens on exertion, patient also complained of chest discomfort which started about the same time as onset of symptoms.   In the emergency department, patient was tachycardic, tachypneic, BP was 120/96, O2 sat was 95% on room air, but patient became hypoxic after being in the ED for a while, supplemental oxygen at 3 LPM was provided, but he was still struggling, BiPAP was then provided with improved oxygenation and decreased work of breathing.  Workup in the ED showed normocytic anemia.  BMP was normal except for blood glucose of 123, BUN/creatinine 43/3.49 (baseline creatinine 3.  0 to 3.1), calcium  10.9, eGFR 19.  Troponin 101 > 90, proBNP 22,315.  Respiratory panel was negative.  Chest x-ray showed bilateral mid and lower lung airspace disease, right greater than left, possibly representing asymmetric edema or infection.  IV Lasix  40 mg x 1 was given.  TRH was asked to admit patient

## 2024-03-10 DIAGNOSIS — I5023 Acute on chronic systolic (congestive) heart failure: Secondary | ICD-10-CM | POA: Diagnosis not present

## 2024-03-10 LAB — RENAL FUNCTION PANEL
Albumin: 3.1 g/dL — ABNORMAL LOW (ref 3.5–5.0)
Anion gap: 13 (ref 5–15)
BUN: 49 mg/dL — ABNORMAL HIGH (ref 8–23)
CO2: 21 mmol/L — ABNORMAL LOW (ref 22–32)
Calcium: 9.7 mg/dL (ref 8.9–10.3)
Chloride: 107 mmol/L (ref 98–111)
Creatinine, Ser: 3.45 mg/dL — ABNORMAL HIGH (ref 0.61–1.24)
GFR, Estimated: 19 mL/min — ABNORMAL LOW (ref >60)
Glucose, Bld: 108 mg/dL — ABNORMAL HIGH (ref 70–99)
Phosphorus: 2.6 mg/dL (ref 2.5–4.6)
Potassium: 3.2 mmol/L — ABNORMAL LOW (ref 3.5–5.1)
Sodium: 141 mmol/L (ref 135–145)

## 2024-03-10 LAB — LIPID PANEL
Cholesterol: 156 mg/dL (ref 0–200)
HDL: 38 mg/dL — ABNORMAL LOW (ref >40)
LDL Cholesterol: 87 mg/dL (ref 0–99)
Total CHOL/HDL Ratio: 4.2 ratio
Triglycerides: 156 mg/dL — ABNORMAL HIGH (ref <150)
VLDL: 31 mg/dL (ref 0–40)

## 2024-03-10 MED ORDER — POTASSIUM CHLORIDE CRYS ER 20 MEQ PO TBCR
40.0000 meq | EXTENDED_RELEASE_TABLET | Freq: Once | ORAL | Status: AC
  Administered 2024-03-10: 40 meq via ORAL
  Filled 2024-03-10: qty 2

## 2024-03-10 NOTE — Discharge Summary (Signed)
 Physician Discharge Summary  [name] FMW:[id number] DOB: Jan 28, 1956 DOA: 03/07/2024  PCP: Tobie Suzzane POUR, MD  Admit date: 03/07/2024 Discharge date: 03/10/2024  Admitted From:  HOME  Disposition:  HOME   Recommendations for Outpatient Follow-up:  Follow up with PCP in 1 weeks Follow up with cardiology as scheduled for 03/17/24 for the heart failure clinic Please obtain BMP/CBC in one week   Discharge Condition: STABLE   CODE STATUS: FULL DIET: low sodium recommended    Brief Hospitalization Summary: Please see all hospital notes, images, labs for full details of the hospitalization. Admission provider HPI:  68 y.o. male with medical history significant of hypertension, HFrEF, CAD, T2DM, CKD who presents to the emergency department due to 3-day onset of worsening shortness of breath and chest pain, patient complaining of increased leg swelling.  Shortness of breath worsens on exertion, patient also complained of chest discomfort which started about the same time as onset of symptoms.   In the emergency department, patient was tachycardic, tachypneic, BP was 120/96, O2 sat was 95% on room air, but patient became hypoxic after being in the ED for a while, supplemental oxygen at 3 LPM was provided, but he was still struggling, BiPAP was then provided with improved oxygenation and decreased work of breathing.  Workup in the ED showed normocytic anemia.  BMP was normal except for blood glucose of 123, BUN/creatinine 43/3.49 (baseline creatinine 3.  0 to 3.1), calcium  10.9, eGFR 19.  Troponin 101 > 90, proBNP 22,315.  Respiratory panel was negative.  Chest x-ray showed bilateral mid and lower lung airspace disease, right greater than left, possibly representing asymmetric edema or infection.  IV Lasix  40 mg x 1 was given.  TRH was asked to admit patient  Hospital Course by listed problems addressed  Acute HFrEF -- Improved  -- LVEF down to 20-25% from 11/24 -- IV furosemide  40 mg IV BID  was ordered in hospital with good results  -- monitoring I/O, weight -- ReDs vest readings done to guide improvement -- appreciate cardiology team consultation, ok to discharge today, resume home oral diuretic regimen as he had not been taking it at home regularly  -- DC home today   [facility] Weights   03/09/24 0550 03/09/24 1955 03/10/24 0500  Weight: 85.4 kg 83.2 kg 80.8 kg    Intake/Output Summary (Last 24 hours) at 03/10/2024 1025 Last data filed at 03/10/2024 0827 Gross per 24 hour  Intake 480 ml  Output 1200 ml  Net -720 ml    Acute Resp Failure with hypoxia -- RESOLVED  -- secondary to CHF exacerbation -- improved with diuresis   AKI on CKD 4 -- creatinine down some with diuresis -- follow up with his nephrologist Dr. Rachele    CAD -- no CP symptoms -- resumed his home brilinta , atorvastatin    Essential hypertension -- soft BPs improved now, resumed home BP meds   GERD -- pantoprazole  for GI protection   Discharge Diagnoses:  Principal Problem:   Acute exacerbation of CHF (congestive heart failure) (HCC)   Discharge Instructions:  Allergies as of 03/10/2024   No Known Allergies      Medication List     TAKE these medications    allopurinol  100 MG tablet Commonly known as: ZYLOPRIM  Take 100 mg by mouth daily.   ALPRAZolam  0.5 MG tablet Commonly known as: XANAX  Take 1 tablet (0.5 mg total) by mouth 3 (three) times daily as needed for anxiety.   atorvastatin  80 MG tablet Commonly known  as: LIPITOR  Take 1 tablet (80 mg total) by mouth daily.   Brilinta  60 MG Tabs tablet Generic drug: ticagrelor  TAKE (1) TABLET BY MOUTH TWICE DAILY.   carvedilol  12.5 MG tablet Commonly known as: COREG  TAKE 1 TABLET BY MOUTH TWICE DAILY WITH MEALS   cinacalcet  30 MG tablet Commonly known as: SENSIPAR  Take 30 mg by mouth daily.   furosemide  40 MG tablet Commonly known as: LASIX  Take 1 tablet (40 mg total) by mouth 3 (three) times a week. Monday, Wednesday and  Friday   ivabradine  5 MG Tabs tablet Commonly known as: CORLANOR  TAKE 1 TABLET BY MOUTH TWICE DAILY WITH A MEAL   losartan  25 MG tablet Commonly known as: COZAAR  Take 0.5 tablets (12.5 mg total) by mouth daily.   nitroGLYCERIN  0.4 MG SL tablet Commonly known as: Nitrostat  Place 1 tablet (0.4 mg total) under the tongue every 5 (five) minutes as needed.   Oxycodone  HCl 10 MG Tabs Take 1 tablet (10 mg total) by mouth 2 (two) times daily as needed (Severe pain).   pantoprazole  40 MG tablet Commonly known as: PROTONIX  Take 1 tablet (40 mg total) by mouth daily before breakfast.        Follow-up Information     [facility]. Go on 03/17/2024.   Specialty: Cardiology Why: Hospital Follow-Up.  03/17/2024 @ 2:00 PM [facility], Heart Failure Clinic Entrance C off of [address] Please bring all medications to follow-up appointment Free Valet Parking at the door or use Gate Code [phone] to park under the building. Contact information: [address] [place]   [address] [phone]        Tobie Suzzane POUR, MD. Schedule an appointment as soon as possible for a visit in 1 week(s).   Specialty: Internal Medicine Why: Hospital Follow Up Contact information: [address] [place] [address] [phone]                No Known Allergies Allergies as of 03/10/2024   No Known Allergies      Medication List     TAKE these medications    allopurinol  100 MG tablet Commonly known as: ZYLOPRIM  Take 100 mg by mouth daily.   ALPRAZolam  0.5 MG tablet Commonly known as: XANAX  Take 1 tablet (0.5 mg total) by mouth 3 (three) times daily as needed for anxiety.   atorvastatin  80 MG tablet Commonly known as: LIPITOR  Take 1 tablet (80 mg total) by mouth daily.   Brilinta  60 MG Tabs tablet Generic drug: ticagrelor  TAKE (1) TABLET BY MOUTH TWICE DAILY.   carvedilol  12.5 MG tablet Commonly  known as: COREG  TAKE 1 TABLET BY MOUTH TWICE DAILY WITH MEALS   cinacalcet  30 MG tablet Commonly known as: SENSIPAR  Take 30 mg by mouth daily.   furosemide  40 MG tablet Commonly known as: LASIX  Take 1 tablet (40 mg total) by mouth 3 (three) times a week. Monday, Wednesday and Friday   ivabradine  5 MG Tabs tablet Commonly known as: CORLANOR  TAKE 1 TABLET BY MOUTH TWICE DAILY WITH A MEAL   losartan  25 MG tablet Commonly known as: COZAAR  Take 0.5 tablets (12.5 mg total) by mouth daily.   nitroGLYCERIN  0.4 MG SL tablet Commonly known as: Nitrostat  Place 1 tablet (0.4 mg total) under the tongue every 5 (five) minutes as needed.   Oxycodone  HCl 10 MG Tabs Take 1 tablet (10 mg total) by mouth 2 (two) times daily as needed (Severe pain).  pantoprazole  40 MG tablet Commonly known as: PROTONIX  Take 1 tablet (40 mg total) by mouth daily before breakfast.        Procedures/Studies: DG CHEST PORT 1 VIEW Result Date: 03/09/2024 EXAM: 1 VIEW(S) XRAY OF THE CHEST 03/09/2024 08:52:49 AM COMPARISON: 03/07/2024 CLINICAL HISTORY: CHF (congestive heart failure) (HCC) FINDINGS: LINES, TUBES AND DEVICES: Stable left chest wall cardiac device with lead projecting over right ventricle. LUNGS AND PLEURA: Mild pulmonary edema, decreased from prior. Improved hazy bibasilar airspace opacities. No focal pulmonary opacity. No pleural effusion. No pneumothorax. HEART AND MEDIASTINUM: Unchanged cardiomediastinal silhouette with mild cardiomegaly. BONES AND SOFT TISSUES: No acute osseous abnormality. IMPRESSION: 1. Mild pulmonary edema, improved from prior. Electronically signed by: Waddell Calk MD 03/09/2024 02:29 PM EST RP Workstation: [id number]   ECHOCARDIOGRAM COMPLETE Result Date: 03/08/2024    ECHOCARDIOGRAM REPORT   Patient Name:   [name] Date of Exam: 03/08/2024 Medical Rec #:  [id number]        Height:       72.0 in Accession #:    [id number]       Weight:       188.1 lb Date of Birth:   1955/10/15        BSA:          2.076 m Patient Age:    68 years         BP:           116/94 mmHg Patient Gender: M                HR:           110 bpm. Exam Location:  [facility] Procedure: 2D Echo, Cardiac Doppler, Color Doppler and Intracardiac            Opacification Agent (Both Spectral and Color Flow Doppler were            utilized during procedure). Indications:    CHF I50.21  History:        Patient has prior history of Echocardiogram examinations, most                 recent 03/25/2023. CHF and Ischemic Cardiomyopathy, STEMI and                 CAD, Defibrillator, Signs/Symptoms:Syncope and Dyspnea; Risk                 Factors:Hypertension, Hyperlipidemia, Diabetes and Former                 Smoker.  Sonographer:    BERNARDA ROCKS Referring Phys: [id number] OLADAPO ADEFESO IMPRESSIONS  1. Left ventricular ejection fraction, by estimation, is 20 to 25%. The left ventricle has severely decreased function. The left ventricle demonstrates regional wall motion abnormalities (see scoring diagram/findings for description). The left ventricular internal cavity size was moderately to severely dilated. Left ventricular diastolic parameters are consistent with Grade III diastolic dysfunction (restrictive). Elevated left atrial pressure.  2. No formed LV mural thrombus evident by Definity  contrast.  3. Right ventricular systolic function is normal. The right ventricular size is normal. There is moderately elevated pulmonary artery systolic pressure. The estimated right ventricular systolic pressure is 50.0 mmHg.  4. Left atrial size was mild to moderately dilated.  5. The mitral valve is grossly normal. Moderate mitral valve regurgitation (PISA calculation not correct).  6. The aortic valve is tricuspid. Aortic valve regurgitation is not visualized. No aortic stenosis is present. Aortic valve mean gradient measures  1.0 mmHg.  7. The inferior vena cava is normal in size with <50% respiratory variability, suggesting  right atrial pressure of 8 mmHg. Comparison(s): Prior images reviewed side by side. LVEF 20-25% with wall motion abnormalities (similar to prior study 03/2023 per review of images). Grade 3 diastolic dysfunction. Moderately elevated estimated RVSP. Moderate mitral regurgitation. FINDINGS  Left Ventricle: Left ventricular ejection fraction, by estimation, is 20 to 25%. The left ventricle has severely decreased function. The left ventricle demonstrates regional wall motion abnormalities. Definity  contrast agent was given IV to delineate the left ventricular endocardial borders. The left ventricular internal cavity size was moderately to severely dilated. There is no left ventricular hypertrophy. Left ventricular diastolic parameters are consistent with Grade III diastolic dysfunction (restrictive). Elevated left atrial pressure.  LV Wall Scoring: The entire anterior wall, entire septum, and entire apex are akinetic. The antero-lateral wall, inferior wall, and mid inferolateral segment are hypokinetic. The basal inferolateral segment is normal. Right Ventricle: The right ventricular size is normal. No increase in right ventricular wall thickness. Right ventricular systolic function is normal. There is moderately elevated pulmonary artery systolic pressure. The tricuspid regurgitant velocity is 3.24 m/s, and with an assumed right atrial pressure of 8 mmHg, the estimated right ventricular systolic pressure is 50.0 mmHg. Left Atrium: Left atrial size was mild to moderately dilated. Right Atrium: Right atrial size was normal in size. Pericardium: There is no evidence of pericardial effusion. Mitral Valve: The mitral valve is grossly normal. Moderate mitral valve regurgitation. MV peak gradient, 5.8 mmHg. The mean mitral valve gradient is 2.0 mmHg. Tricuspid Valve: The tricuspid valve is grossly normal. Tricuspid valve regurgitation is mild. Aortic Valve: The aortic valve is tricuspid. There is mild aortic valve annular  calcification. Aortic valve regurgitation is not visualized. No aortic stenosis is present. Aortic valve mean gradient measures 1.0 mmHg. Aortic valve peak gradient measures 3.3 mmHg. Aortic valve area, by VTI measures 3.24 cm. Pulmonic Valve: The pulmonic valve was grossly normal. Pulmonic valve regurgitation is trivial. Aorta: The aortic root and ascending aorta are structurally normal, with no evidence of dilitation. Venous: The inferior vena cava is normal in size with less than 50% respiratory variability, suggesting right atrial pressure of 8 mmHg. IAS/Shunts: No atrial level shunt detected by color flow Doppler. Additional Comments: 3D was performed not requiring image post processing on an independent workstation and was indeterminate. A device lead is visualized.  LEFT VENTRICLE PLAX 2D LVIDd:         7.00 cm LV PW:         0.70 cm LV IVS:        0.80 cm LVOT diam:     2.00 cm LV SV:         32 LV SV Index:   15 LVOT Area:     3.14 cm  LV Volumes (MOD) LV vol d, MOD A2C: 312.0 ml LV vol d, MOD A4C: 453.0 ml LV vol s, MOD A2C: 255.0 ml LV vol s, MOD A4C: 348.0 ml LV SV MOD A2C:     57.0 ml LV SV MOD A4C:     453.0 ml LV SV MOD BP:      74.0 ml RIGHT VENTRICLE             IVC RV Basal diam:  3.60 cm     IVC diam: 1.60 cm RV S prime:     22.90 cm/s TAPSE (M-mode): 1.8 cm RVSP:  50.0 mmHg LEFT ATRIUM             Index        RIGHT ATRIUM           Index LA diam:        4.20 cm 2.02 cm/m   RA Pressure: 8.00 mmHg LA Vol (A2C):   95.5 ml 46.01 ml/m  RA Area:     14.20 cm LA Vol (A4C):   69.0 ml 33.24 ml/m  RA Volume:   37.80 ml  18.21 ml/m LA Biplane Vol: 83.4 ml 40.18 ml/m  AORTIC VALVE                    PULMONIC VALVE AV Area (Vmax):    3.15 cm     PV Vmax:          0.78 m/s AV Area (Vmean):   3.19 cm     PV Peak grad:     2.4 mmHg AV Area (VTI):     3.24 cm     PR End Diast Vel: 0.65 msec AV Vmax:           90.70 cm/s AV Vmean:          54.400 cm/s AV VTI:            0.098 m AV Peak Grad:       3.3 mmHg AV Mean Grad:      1.0 mmHg LVOT Vmax:         90.90 cm/s LVOT Vmean:        55.300 cm/s LVOT VTI:          0.101 m LVOT/AV VTI ratio: 1.03  AORTA Ao Root diam: 2.80 cm Ao Asc diam:  3.50 cm MITRAL VALVE                  TRICUSPID VALVE MV Area (PHT): 6.17 cm       TR Peak grad:   42.0 mmHg MV Area VTI:   1.65 cm       TR Vmax:        324.00 cm/s MV Peak grad:  5.8 mmHg       Estimated RAP:  8.00 mmHg MV Mean grad:  2.0 mmHg       RVSP:           50.0 mmHg MV Vmax:       1.20 m/s MV Vmean:      71.4 cm/s      SHUNTS MV Decel Time: 123 msec       Systemic VTI:  0.10 m MR Peak grad:    96.4 mmHg    Systemic Diam: 2.00 cm MR Mean grad:    57.0 mmHg MR Vmax:         491.00 cm/s MR Vmean:        347.0 cm/s MR PISA:         16.08 cm MR PISA Eff ROA: 110 mm MR PISA Radius:  1.60 cm MV E velocity: 117.00 cm/s MV A velocity: 54.40 cm/s MV E/A ratio:  2.15 Jayson Sierras MD Electronically signed by Jayson Sierras MD Signature Date/Time: 03/08/2024/4:35:25 PM    Final    DG Chest 2 View Result Date: 03/07/2024 EXAM: 2 VIEW(S) XRAY OF THE CHEST 03/07/2024 10:15:00 PM COMPARISON: 02/22/2021 CLINICAL HISTORY: chest pain, shortness of breath FINDINGS: LINES, TUBES AND DEVICES: Left AICD remains in place, unchanged. LUNGS AND PLEURA: Bilateral mid and lower lung  airspace disease, right greater than left. Findings could reflect asymmetric edema or infection. No pleural effusion. No pneumothorax. HEART AND MEDIASTINUM: Left AICD remains in place, unchanged. No acute abnormality of the cardiac and mediastinal silhouettes. BONES AND SOFT TISSUES: No acute osseous abnormality. IMPRESSION: 1. Bilateral mid and lower lung airspace disease, right greater than left, possibly representing asymmetric edema or infection. Electronically signed by: Franky Crease MD 03/07/2024 10:26 PM EST RP Workstation: [id number]     Subjective: Pt reports he feels well today, no SOB, no CP, ambulating in room, eager to discharge home  today  Discharge Exam: Vitals:   03/10/24 0453 03/10/24 0918  BP: 95/62   Pulse:    Resp:    Temp: 98.2 F (36.8 C)   SpO2:  98%   Vitals:   03/09/24 1955 03/10/24 0453 03/10/24 0500 03/10/24 0918  BP: 101/82 95/62    Pulse: 74     Resp: 15     Temp: 98.4 F (36.9 C) 98.2 F (36.8 C)    TempSrc: Oral Oral    SpO2: 97%   98%  Weight: 83.2 kg  80.8 kg   Height:        General: Pt is alert, awake, not in acute distress Cardiovascular: RRR, S1/S2 +, no rubs, no gallops Respiratory: CTA bilaterally, no wheezing, no rhonchi Abdominal: Soft, NT, ND, bowel sounds + Extremities: no edema, no cyanosis   The results of significant diagnostics from this hospitalization (including imaging, microbiology, ancillary and laboratory) are listed below for reference.     Microbiology: Recent Results (from the past 240 hours)  Resp panel by RT-PCR (RSV, Flu A&B, Covid) Anterior Nasal Swab     Status: None   Collection Time: 03/07/24 11:28 PM   Specimen: Anterior Nasal Swab  Result Value Ref Range Status   SARS Coronavirus 2 by RT PCR NEGATIVE NEGATIVE Final    Comment: (NOTE) SARS-CoV-2 target nucleic acids are NOT DETECTED.  The SARS-CoV-2 RNA is generally detectable in upper respiratory specimens during the acute phase of infection. The lowest concentration of SARS-CoV-2 viral copies this assay can detect is 138 copies/mL. A negative result does not preclude SARS-Cov-2 infection and should not be used as the sole basis for treatment or other patient management decisions. A negative result may occur with  improper specimen collection/handling, submission of specimen other than nasopharyngeal swab, presence of viral mutation(s) within the areas targeted by this assay, and inadequate number of viral copies(<138 copies/mL). A negative result must be combined with clinical observations, patient history, and epidemiological information. The expected result is Negative.  Fact Sheet  for Patients:  bloggercourse.com  Fact Sheet for Healthcare Providers:  seriousbroker.it  This test is no t yet approved or cleared by the [place]  FDA and  has been authorized for detection and/or diagnosis of SARS-CoV-2 by FDA under an Emergency Use Authorization (EUA). This EUA will remain  in effect (meaning this test can be used) for the duration of the COVID-19 declaration under Section 564(b)(1) of the Act, 21 U.S.C.section 360bbb-3(b)(1), unless the authorization is terminated  or revoked sooner.       Influenza A by PCR NEGATIVE NEGATIVE Final   Influenza B by PCR NEGATIVE NEGATIVE Final    Comment: (NOTE) The Xpert Xpress SARS-CoV-2/FLU/RSV plus assay is intended as an aid in the diagnosis of influenza from Nasopharyngeal swab specimens and should not be used as a sole basis for treatment. Nasal washings and aspirates are unacceptable for Xpert Xpress SARS-CoV-2/FLU/RSV testing.  Fact Sheet for Patients: bloggercourse.com  Fact Sheet for Healthcare Providers: seriousbroker.it  This test is not yet approved or cleared by the [place]  FDA and has been authorized for detection and/or diagnosis of SARS-CoV-2 by FDA under an Emergency Use Authorization (EUA). This EUA will remain in effect (meaning this test can be used) for the duration of the COVID-19 declaration under Section 564(b)(1) of the Act, 21 U.S.C. section 360bbb-3(b)(1), unless the authorization is terminated or revoked.     Resp Syncytial Virus by PCR NEGATIVE NEGATIVE Final    Comment: (NOTE) Fact Sheet for Patients: bloggercourse.com  Fact Sheet for Healthcare Providers: seriousbroker.it  This test is not yet approved or cleared by the [place]  FDA and has been authorized for detection and/or diagnosis of SARS-CoV-2 by FDA under an  Emergency Use Authorization (EUA). This EUA will remain in effect (meaning this test can be used) for the duration of the COVID-19 declaration under Section 564(b)(1) of the Act, 21 U.S.C. section 360bbb-3(b)(1), unless the authorization is terminated or revoked.  Performed at [facility], [address], [place], [place] [address]      Labs: BNP (last 3 results) Recent Labs    07/23/23 1011  BNP 318.0*   Basic Metabolic Panel: Recent Labs  Lab 03/07/24 2226 03/08/24 0758 03/09/24 0415 03/10/24 0449  NA 139  --  140 141  K 4.9  --  3.5 3.2*  CL 105  --  107 107  CO2 22  --  21* 21*  GLUCOSE 123*  --  118* 108*  BUN 43*  --  47* 49*  CREATININE 3.49* 3.24* 3.24* 3.45*  CALCIUM  10.9*  --  9.9 9.7  MG  --   --  2.0  --   PHOS  --   --  1.8* 2.6   Liver Function Tests: Recent Labs  Lab 03/07/24 2226 03/09/24 0415 03/10/24 0449  AST 11*  --   --   ALT 7  --   --   ALKPHOS 82  --   --   BILITOT 1.2  --   --   PROT 6.9  --   --   ALBUMIN 3.5 3.1* 3.1*   No results for input(s): LIPASE, AMYLASE in the last 168 hours. No results for input(s): AMMONIA in the last 168 hours. CBC: Recent Labs  Lab 03/07/24 2226 03/08/24 0758 03/09/24 0415  WBC 7.3 9.5 6.0  HGB 12.9* 12.4* 11.9*  HCT 38.8* 36.5* 35.4*  MCV 94.4 91.9 92.4  PLT 184 171 175   Cardiac Enzymes: No results for input(s): CKTOTAL, CKMB, CKMBINDEX, TROPONINI in the last 168 hours. BNP: Invalid input(s): POCBNP CBG: No results for input(s): GLUCAP in the last 168 hours. D-Dimer No results for input(s): DDIMER in the last 72 hours. Hgb A1c No results for input(s): HGBA1C in the last 72 hours. Lipid Profile Recent Labs    03/10/24 0449  CHOL 156  HDL 38*  LDLCALC 87  TRIG 843*  CHOLHDL 4.2   Thyroid  function studies No results for input(s): TSH, T4TOTAL, T3FREE, THYROIDAB in the last 72 hours.  Invalid input(s): FREET3 Anemia work up No results for  input(s): VITAMINB12, FOLATE, FERRITIN, TIBC, IRON , RETICCTPCT in the last 72 hours. Urinalysis    Component Value Date/Time   COLORURINE YELLOW 02/22/2021 1225   APPEARANCEUR Clear 07/10/2021 1609   LABSPEC 1.012 02/22/2021 1225   PHURINE 5.0 02/22/2021 1225   GLUCOSEU Negative 07/10/2021 1609   HGBUR NEGATIVE 02/22/2021 1225  BILIRUBINUR Negative 07/10/2021 1609   KETONESUR NEGATIVE 02/22/2021 1225   PROTEINUR Negative 07/10/2021 1609   PROTEINUR NEGATIVE 02/22/2021 1225   UROBILINOGEN 0.2 12/27/2019 0937   UROBILINOGEN 0.2 04/08/2011 1232   NITRITE Negative 07/10/2021 1609   NITRITE NEGATIVE 02/22/2021 1225   LEUKOCYTESUR Trace (A) 07/10/2021 1609   LEUKOCYTESUR NEGATIVE 02/22/2021 1225   Sepsis Labs Recent Labs  Lab 03/07/24 2226 03/08/24 0758 03/09/24 0415  WBC 7.3 9.5 6.0   Microbiology Recent Results (from the past 240 hours)  Resp panel by RT-PCR (RSV, Flu A&B, Covid) Anterior Nasal Swab     Status: None   Collection Time: 03/07/24 11:28 PM   Specimen: Anterior Nasal Swab  Result Value Ref Range Status   SARS Coronavirus 2 by RT PCR NEGATIVE NEGATIVE Final    Comment: (NOTE) SARS-CoV-2 target nucleic acids are NOT DETECTED.  The SARS-CoV-2 RNA is generally detectable in upper respiratory specimens during the acute phase of infection. The lowest concentration of SARS-CoV-2 viral copies this assay can detect is 138 copies/mL. A negative result does not preclude SARS-Cov-2 infection and should not be used as the sole basis for treatment or other patient management decisions. A negative result may occur with  improper specimen collection/handling, submission of specimen other than nasopharyngeal swab, presence of viral mutation(s) within the areas targeted by this assay, and inadequate number of viral copies(<138 copies/mL). A negative result must be combined with clinical observations, patient history, and epidemiological information. The expected  result is Negative.  Fact Sheet for Patients:  bloggercourse.com  Fact Sheet for Healthcare Providers:  seriousbroker.it  This test is no t yet approved or cleared by the [place]  FDA and  has been authorized for detection and/or diagnosis of SARS-CoV-2 by FDA under an Emergency Use Authorization (EUA). This EUA will remain  in effect (meaning this test can be used) for the duration of the COVID-19 declaration under Section 564(b)(1) of the Act, 21 U.S.C.section 360bbb-3(b)(1), unless the authorization is terminated  or revoked sooner.       Influenza A by PCR NEGATIVE NEGATIVE Final   Influenza B by PCR NEGATIVE NEGATIVE Final    Comment: (NOTE) The Xpert Xpress SARS-CoV-2/FLU/RSV plus assay is intended as an aid in the diagnosis of influenza from Nasopharyngeal swab specimens and should not be used as a sole basis for treatment. Nasal washings and aspirates are unacceptable for Xpert Xpress SARS-CoV-2/FLU/RSV testing.  Fact Sheet for Patients: bloggercourse.com  Fact Sheet for Healthcare Providers: seriousbroker.it  This test is not yet approved or cleared by the [place]  FDA and has been authorized for detection and/or diagnosis of SARS-CoV-2 by FDA under an Emergency Use Authorization (EUA). This EUA will remain in effect (meaning this test can be used) for the duration of the COVID-19 declaration under Section 564(b)(1) of the Act, 21 U.S.C. section 360bbb-3(b)(1), unless the authorization is terminated or revoked.     Resp Syncytial Virus by PCR NEGATIVE NEGATIVE Final    Comment: (NOTE) Fact Sheet for Patients: bloggercourse.com  Fact Sheet for Healthcare Providers: seriousbroker.it  This test is not yet approved or cleared by the [place]  FDA and has been authorized for detection and/or diagnosis of  SARS-CoV-2 by FDA under an Emergency Use Authorization (EUA). This EUA will remain in effect (meaning this test can be used) for the duration of the COVID-19 declaration under Section 564(b)(1) of the Act, 21 U.S.C. section 360bbb-3(b)(1), unless the authorization is terminated or revoked.  Performed at [facility], [phone]  [address] [place], [place] [address]    Time coordinating discharge:  36 mins   SIGNED:  Afton Louder, MD  [facility] 03/10/2024, 10:21 AM How to contact the [facility] Attending or Consulting provider 7A - 7P or covering provider during after hours 7P -7A, for this patient?  Check the care team in [facility] and look for a) attending/consulting TRH provider listed and b) the TRH team listed Log into [email or web address] and use [facility]'s universal password to access. If you do not have the password, please contact the hospital operator. Locate the TRH provider you are looking for under [facility] and page to a number that you can be directly reached. If you still have difficulty reaching the provider, please page the [facility] (Director on Call) for the Hospitalists listed on amion for assistance.

## 2024-03-10 NOTE — Discharge Instructions (Signed)
IMPORTANT INFORMATION: PAY CLOSE ATTENTION   PHYSICIAN DISCHARGE INSTRUCTIONS  Follow with Primary care provider  Patel, Rutwik K, MD  and other consultants as instructed by your Hospitalist Physician  SEEK MEDICAL CARE OR RETURN TO EMERGENCY ROOM IF SYMPTOMS COME BACK, WORSEN OR NEW PROBLEM DEVELOPS   Please note: You were cared for by a hospitalist during your hospital stay. Every effort will be made to forward records to your primary care provider.  You can request that your primary care provider send for your hospital records if they have not received them.  Once you are discharged, your primary care physician will handle any further medical issues. Please note that NO REFILLS for any discharge medications will be authorized once you are discharged, as it is imperative that you return to your primary care physician (or establish a relationship with a primary care physician if you do not have one) for your post hospital discharge needs so that they can reassess your need for medications and monitor your lab values.  Please get a complete blood count and chemistry panel checked by your Primary MD at your next visit, and again as instructed by your Primary MD.  Get Medicines reviewed and adjusted: Please take all your medications with you for your next visit with your Primary MD  Laboratory/radiological data: Please request your Primary MD to go over all hospital tests and procedure/radiological results at the follow up, please ask your primary care provider to get all Hospital records sent to his/her office.  In some cases, they will be blood work, cultures and biopsy results pending at the time of your discharge. Please request that your primary care provider follow up on these results.  If you are diabetic, please bring your blood sugar readings with you to your follow up appointment with primary care.    Please call and make your follow up appointments as soon as possible.    Also Note  the following: If you experience worsening of your admission symptoms, develop shortness of breath, life threatening emergency, suicidal or homicidal thoughts you must seek medical attention immediately by calling 911 or calling your MD immediately  if symptoms less severe.  You must read complete instructions/literature along with all the possible adverse reactions/side effects for all the Medicines you take and that have been prescribed to you. Take any new Medicines after you have completely understood and accpet all the possible adverse reactions/side effects.   Do not drive when taking Pain medications or sleeping medications (Benzodiazepines)  Do not take more than prescribed Pain, Sleep and Anxiety Medications. It is not advisable to combine anxiety,sleep and pain medications without talking with your primary care practitioner  Special Instructions: If you have smoked or chewed Tobacco  in the last 2 yrs please stop smoking, stop any regular Alcohol  and or any Recreational drug use.  Wear Seat belts while driving.  Do not drive if taking any narcotic, mind altering or controlled substances or recreational drugs or alcohol.       

## 2024-03-10 NOTE — Progress Notes (Signed)
 Discharge instructions reviewed with patient, patient verbalized understanding of instructions. Patient discharged home in stable condition.

## 2024-03-10 NOTE — Care Management Important Message (Signed)
 Important Message  Patient Details  Name: [name] MRN: [id number] Date of Birth: January 09, 1956   Important Message Given:  Yes - Medicare IM     Anaih Brander L Beau Vanduzer 03/10/2024, 11:14 AM

## 2024-03-10 NOTE — Progress Notes (Signed)
 Rounding Note   Patient Name: [name] Date of Encounter: 03/10/2024  [facility] Cardiologist: Victory LELON Claudene DOUGLAS, MD (Inactive)   Subjective Breathing much improved  Scheduled Meds:  allopurinol   100 mg Oral Daily   atorvastatin   80 mg Oral Daily   carvedilol   12.5 mg Oral BID WC   cinacalcet   30 mg Oral Q breakfast   enoxaparin  (LOVENOX ) injection  30 mg Subcutaneous Q24H   furosemide   40 mg Intravenous Q12H   ivabradine   5 mg Oral BID WC   losartan   12.5 mg Oral Daily   pantoprazole   40 mg Oral QAC breakfast   potassium chloride   40 mEq Oral Once   ticagrelor   60 mg Oral BID   Continuous Infusions:  PRN Meds: acetaminophen  **OR** acetaminophen , ALPRAZolam , nitroGLYCERIN , ondansetron  **OR** ondansetron  (ZOFRAN ) IV, oxyCODONE    Vital Signs  Vitals:   03/09/24 1625 03/09/24 1955 03/10/24 0453 03/10/24 0500  BP: 107/83 101/82 95/62   Pulse: 89 74    Resp: 17 15    Temp:  98.4 F (36.9 C) 98.2 F (36.8 C)   TempSrc:  Oral Oral   SpO2: 93% 97%    Weight:  83.2 kg  80.8 kg  Height:        Intake/Output Summary (Last 24 hours) at 03/10/2024 0841 Last data filed at 03/10/2024 0827 Gross per 24 hour  Intake 720 ml  Output 1650 ml  Net -930 ml      03/10/2024    5:00 AM 03/09/2024    7:55 PM 03/09/2024    5:50 AM  Last 3 Weights  Weight (lbs) 178 lb 2.1 oz 183 lb 6.8 oz 188 lb 4.4 oz  Weight (kg) 80.8 kg 83.2 kg 85.4 kg      Telemetry NSR - Personally Reviewed  ECG  N/a - Personally Reviewed  Physical Exam  GEN: No acute distress.   Neck: No JVD Cardiac: RRR, no murmurs, rubs, or gallops.  Respiratory: faint crackles bilateral bases GI: Soft, nontender, non-distended  MS: No edema; No deformity. Neuro:  Nonfocal  Psych: Normal affect   Labs High Sensitivity Troponin:  No results for input(s): TROPONINIHS in the last 720 hours.   Chemistry Recent Labs  Lab 03/07/24 2226 03/08/24 0758 03/09/24 0415 03/10/24 0449  NA 139  --   140 141  K 4.9  --  3.5 3.2*  CL 105  --  107 107  CO2 22  --  21* 21*  GLUCOSE 123*  --  118* 108*  BUN 43*  --  47* 49*  CREATININE 3.49* 3.24* 3.24* 3.45*  CALCIUM  10.9*  --  9.9 9.7  MG  --   --  2.0  --   PROT 6.9  --   --   --   ALBUMIN 3.5  --  3.1* 3.1*  AST 11*  --   --   --   ALT 7  --   --   --   ALKPHOS 82  --   --   --   BILITOT 1.2  --   --   --   GFRNONAA 19* 20* 20* 19*  ANIONGAP 12  --  13 13    Lipids No results for input(s): CHOL, TRIG, HDL, LABVLDL, LDLCALC, CHOLHDL in the last 168 hours.  Hematology Recent Labs  Lab 03/07/24 2226 03/08/24 0758 03/09/24 0415  WBC 7.3 9.5 6.0  RBC 4.11* 3.97* 3.83*  HGB 12.9* 12.4* 11.9*  HCT 38.8* 36.5* 35.4*  MCV 94.4 91.9 92.4  MCH 31.4 31.2 31.1  MCHC 33.2 34.0 33.6  RDW 14.3 13.9 13.8  PLT 184 171 175   Thyroid  No results for input(s): TSH, FREET4 in the last 168 hours.  BNP Recent Labs  Lab 03/07/24 2226  PROBNP 22,315.0*    DDimer No results for input(s): DDIMER in the last 168 hours.   Radiology  DG CHEST PORT 1 VIEW Result Date: 03/09/2024 EXAM: 1 VIEW(S) XRAY OF THE CHEST 03/09/2024 08:52:49 AM COMPARISON: 03/07/2024 CLINICAL HISTORY: CHF (congestive heart failure) (HCC) FINDINGS: LINES, TUBES AND DEVICES: Stable left chest wall cardiac device with lead projecting over right ventricle. LUNGS AND PLEURA: Mild pulmonary edema, decreased from prior. Improved hazy bibasilar airspace opacities. No focal pulmonary opacity. No pleural effusion. No pneumothorax. HEART AND MEDIASTINUM: Unchanged cardiomediastinal silhouette with mild cardiomegaly. BONES AND SOFT TISSUES: No acute osseous abnormality. IMPRESSION: 1. Mild pulmonary edema, improved from prior. Electronically signed by: Waddell Calk MD 03/09/2024 02:29 PM EST RP Workstation: [id number]   ECHOCARDIOGRAM COMPLETE Result Date: 03/08/2024    ECHOCARDIOGRAM REPORT   Patient Name:   [name] Date of Exam: 03/08/2024 Medical Rec #:   [id number]        Height:       72.0 in Accession #:    [id number]       Weight:       188.1 lb Date of Birth:  February 11, 1956        BSA:          2.076 m Patient Age:    68 years         BP:           116/94 mmHg Patient Gender: M                HR:           110 bpm. Exam Location:  [facility] Procedure: 2D Echo, Cardiac Doppler, Color Doppler and Intracardiac            Opacification Agent (Both Spectral and Color Flow Doppler were            utilized during procedure). Indications:    CHF I50.21  History:        Patient has prior history of Echocardiogram examinations, most                 recent 03/25/2023. CHF and Ischemic Cardiomyopathy, STEMI and                 CAD, Defibrillator, Signs/Symptoms:Syncope and Dyspnea; Risk                 Factors:Hypertension, Hyperlipidemia, Diabetes and Former                 Smoker.  Sonographer:    BERNARDA ROCKS Referring Phys: [id number] OLADAPO ADEFESO IMPRESSIONS  1. Left ventricular ejection fraction, by estimation, is 20 to 25%. The left ventricle has severely decreased function. The left ventricle demonstrates regional wall motion abnormalities (see scoring diagram/findings for description). The left ventricular internal cavity size was moderately to severely dilated. Left ventricular diastolic parameters are consistent with Grade III diastolic dysfunction (restrictive). Elevated left atrial pressure.  2. No formed LV mural thrombus evident by Definity  contrast.  3. Right ventricular systolic function is normal. The right ventricular size is normal. There is moderately elevated pulmonary artery systolic pressure. The estimated right ventricular systolic pressure is 50.0 mmHg.  4. Left atrial size was mild to moderately  dilated.  5. The mitral valve is grossly normal. Moderate mitral valve regurgitation (PISA calculation not correct).  6. The aortic valve is tricuspid. Aortic valve regurgitation is not visualized. No aortic stenosis is present. Aortic valve mean gradient  measures 1.0 mmHg.  7. The inferior vena cava is normal in size with <50% respiratory variability, suggesting right atrial pressure of 8 mmHg. Comparison(s): Prior images reviewed side by side. LVEF 20-25% with wall motion abnormalities (similar to prior study 03/2023 per review of images). Grade 3 diastolic dysfunction. Moderately elevated estimated RVSP. Moderate mitral regurgitation. FINDINGS  Left Ventricle: Left ventricular ejection fraction, by estimation, is 20 to 25%. The left ventricle has severely decreased function. The left ventricle demonstrates regional wall motion abnormalities. Definity  contrast agent was given IV to delineate the left ventricular endocardial borders. The left ventricular internal cavity size was moderately to severely dilated. There is no left ventricular hypertrophy. Left ventricular diastolic parameters are consistent with Grade III diastolic dysfunction (restrictive). Elevated left atrial pressure.  LV Wall Scoring: The entire anterior wall, entire septum, and entire apex are akinetic. The antero-lateral wall, inferior wall, and mid inferolateral segment are hypokinetic. The basal inferolateral segment is normal. Right Ventricle: The right ventricular size is normal. No increase in right ventricular wall thickness. Right ventricular systolic function is normal. There is moderately elevated pulmonary artery systolic pressure. The tricuspid regurgitant velocity is 3.24 m/s, and with an assumed right atrial pressure of 8 mmHg, the estimated right ventricular systolic pressure is 50.0 mmHg. Left Atrium: Left atrial size was mild to moderately dilated. Right Atrium: Right atrial size was normal in size. Pericardium: There is no evidence of pericardial effusion. Mitral Valve: The mitral valve is grossly normal. Moderate mitral valve regurgitation. MV peak gradient, 5.8 mmHg. The mean mitral valve gradient is 2.0 mmHg. Tricuspid Valve: The tricuspid valve is grossly normal. Tricuspid  valve regurgitation is mild. Aortic Valve: The aortic valve is tricuspid. There is mild aortic valve annular calcification. Aortic valve regurgitation is not visualized. No aortic stenosis is present. Aortic valve mean gradient measures 1.0 mmHg. Aortic valve peak gradient measures 3.3 mmHg. Aortic valve area, by VTI measures 3.24 cm. Pulmonic Valve: The pulmonic valve was grossly normal. Pulmonic valve regurgitation is trivial. Aorta: The aortic root and ascending aorta are structurally normal, with no evidence of dilitation. Venous: The inferior vena cava is normal in size with less than 50% respiratory variability, suggesting right atrial pressure of 8 mmHg. IAS/Shunts: No atrial level shunt detected by color flow Doppler. Additional Comments: 3D was performed not requiring image post processing on an independent workstation and was indeterminate. A device lead is visualized.  LEFT VENTRICLE PLAX 2D LVIDd:         7.00 cm LV PW:         0.70 cm LV IVS:        0.80 cm LVOT diam:     2.00 cm LV SV:         32 LV SV Index:   15 LVOT Area:     3.14 cm  LV Volumes (MOD) LV vol d, MOD A2C: 312.0 ml LV vol d, MOD A4C: 453.0 ml LV vol s, MOD A2C: 255.0 ml LV vol s, MOD A4C: 348.0 ml LV SV MOD A2C:     57.0 ml LV SV MOD A4C:     453.0 ml LV SV MOD BP:      74.0 ml RIGHT VENTRICLE  IVC RV Basal diam:  3.60 cm     IVC diam: 1.60 cm RV S prime:     22.90 cm/s TAPSE (M-mode): 1.8 cm RVSP:           50.0 mmHg LEFT ATRIUM             Index        RIGHT ATRIUM           Index LA diam:        4.20 cm 2.02 cm/m   RA Pressure: 8.00 mmHg LA Vol (A2C):   95.5 ml 46.01 ml/m  RA Area:     14.20 cm LA Vol (A4C):   69.0 ml 33.24 ml/m  RA Volume:   37.80 ml  18.21 ml/m LA Biplane Vol: 83.4 ml 40.18 ml/m  AORTIC VALVE                    PULMONIC VALVE AV Area (Vmax):    3.15 cm     PV Vmax:          0.78 m/s AV Area (Vmean):   3.19 cm     PV Peak grad:     2.4 mmHg AV Area (VTI):     3.24 cm     PR End Diast Vel: 0.65  msec AV Vmax:           90.70 cm/s AV Vmean:          54.400 cm/s AV VTI:            0.098 m AV Peak Grad:      3.3 mmHg AV Mean Grad:      1.0 mmHg LVOT Vmax:         90.90 cm/s LVOT Vmean:        55.300 cm/s LVOT VTI:          0.101 m LVOT/AV VTI ratio: 1.03  AORTA Ao Root diam: 2.80 cm Ao Asc diam:  3.50 cm MITRAL VALVE                  TRICUSPID VALVE MV Area (PHT): 6.17 cm       TR Peak grad:   42.0 mmHg MV Area VTI:   1.65 cm       TR Vmax:        324.00 cm/s MV Peak grad:  5.8 mmHg       Estimated RAP:  8.00 mmHg MV Mean grad:  2.0 mmHg       RVSP:           50.0 mmHg MV Vmax:       1.20 m/s MV Vmean:      71.4 cm/s      SHUNTS MV Decel Time: 123 msec       Systemic VTI:  0.10 m MR Peak grad:    96.4 mmHg    Systemic Diam: 2.00 cm MR Mean grad:    57.0 mmHg MR Vmax:         491.00 cm/s MR Vmean:        347.0 cm/s MR PISA:         16.08 cm MR PISA Eff ROA: 110 mm MR PISA Radius:  1.60 cm MV E velocity: 117.00 cm/s MV A velocity: 54.40 cm/s MV E/A ratio:  2.15 Jayson Sierras MD Electronically signed by Jayson Sierras MD Signature Date/Time: 03/08/2024/4:35:25 PM    Final       Patient Profile  [name] is a 68 y.o. male with a hx of CAD (s/p STEMI in 03/2019 showing total occlusion of mid LAD treated with DES x 1 and residual 90% OM1 stenosis), chronic HFrEF (EF 20% by echocardiogram in 03/2019, at 32% by repeat echo in 03/2023), [facility] ICD in place, HTN, HLD and Stage IV CKD who is being seen 03/09/2024 for the evaluation of CHF at the request of Dr. Vicci.   Assessment & Plan  1.Acute on chronic HFrEF - 10/2021 echo: LVEF 20-25% - 03/2023 echo: LVEF 30%, indet diastolic, normal RV -03/2024 echo: LVEF 20-25%, no WMAs, grade III dd, normal RV, mod MR - exacerbation secondary to poor dietary and diuretic compliance.  - CXR pending,proBNP 22315  -on IV lasix  40mg  bid. I/Os incomplete, negative roughly 3.1 L since admission. If weights accurate 188-->178 lbs. Mild variation  in Cr but within prior values. Reds vest yesterday 40%.  - medical therapy with coreg  12.5mg  bid, corlanor  5mg  bid, losartan  12.5mg  daily. In general medical therapy due to CKD - was to be on lasix  40mg  MWF, reports was not taking as prescribed, only about once a week. Can resume prior home regimen when ready for oral diuretic   - appears near euvolemic. He reports his breathing is much improved. Will ambulate patient, if does well would be ok for discharge today.      2. CAD -  prior anterolateral STEMI with DES to LAD 03/2019 - no acute issues this admission. Some chest pains on admission that have resolved with diuresis, trop mild and flat not consistent with ACS   3. CKD IV - followed by Dr Rachele as outpatient.     For questions or updates, please contact [facility] Please consult [email or web address] for contact info under       Signed, Alvan Carrier, MD  03/10/2024, 8:41 AM

## 2024-03-13 ENCOUNTER — Telehealth: Payer: Self-pay

## 2024-03-13 NOTE — Transitions of Care (Post Inpatient/ED Visit) (Signed)
   03/13/2024  Name: [name] MRN: [id number] DOB: 1955-05-05  Today's TOC FU Call Status: Today's TOC FU Call Status:: Unsuccessful Call (1st Attempt) Unsuccessful Call (1st Attempt) Date: 03/13/24  Attempted to reach the patient regarding the most recent Inpatient/ED visit. Unable to leave a voicemail message to the phone number provided as preferred and per DPR.  Follow Up Plan: Additional outreach attempts will be made to reach the patient to complete the Transitions of Care (Post Inpatient/ED visit) call.   Richerd Fish, RN, BSN, CCM [facility], [facility] Management Coordinator Direct Dial: [phone]

## 2024-03-14 ENCOUNTER — Telehealth: Payer: Self-pay | Admitting: *Deleted

## 2024-03-14 NOTE — Transitions of Care (Post Inpatient/ED Visit) (Signed)
   03/14/2024  Name: [name] MRN: [id number] DOB: [birth year]  Today's TOC FU Call Status: Today's TOC FU Call Status:: Unsuccessful Call (2nd Attempt) Unsuccessful Call (2nd Attempt) Date: 03/14/24  Attempted to reach the patient regarding the most recent Inpatient/ED visit.  Follow Up Plan: Additional outreach attempts will be made to reach the patient to complete the Transitions of Care (Post Inpatient/ED visit) call.   Andrea Dimes RN, BSN [place]  [facility] RN Care Manager [phone]

## 2024-03-15 ENCOUNTER — Telehealth: Payer: Self-pay

## 2024-03-15 NOTE — Transitions of Care (Post Inpatient/ED Visit) (Signed)
   03/15/2024  Name: [name] MRN: [id number] DOB: Apr 16, 1956  Today's TOC FU Call Status: Today's TOC FU Call Status:: Unsuccessful Call (3rd Attempt) Unsuccessful Call (3rd Attempt) Date: 03/15/24  Attempted to reach the patient regarding the most recent Inpatient/ED visit.  Follow Up Plan: No further outreach attempts will be made at this time. We have been unable to contact the patient.  Richerd Fish, RN, BSN, CCM [facility], [facility] Management Coordinator Direct Dial: [phone]

## 2024-03-16 ENCOUNTER — Telehealth (HOSPITAL_COMMUNITY): Payer: Self-pay

## 2024-03-16 NOTE — Telephone Encounter (Signed)
 Called to confirm/remind patient of their appointment at [facility] on 03/17/24 2:00.   Appointment:   [] Confirmed  [] Left mess   [] No answer/No voice mail  [] VM Full/unable to leave message  [x] Phone not in service  Patient reminded to bring all medications and/or complete list.  Confirmed patient has transportation. Gave directions, instructed to utilize valet parking.

## 2024-03-17 ENCOUNTER — Inpatient Hospital Stay (HOSPITAL_COMMUNITY)

## 2024-03-17 NOTE — Progress Notes (Incomplete)
 ADVANCED HF CLINIC NOTE   Primary Cardiologist: Dr. Wonda Primary Care: Tobie Suzzane POUR, MD Nephrologist: Dr. Patience AHF: Dr. Cherrie   HPI: [name] is a 68 y.o. male, former smoker with HTN, CKD IV, CAD s/p large anterolateral STEMI 9/20 and systolic HF. Has [employer] ICD.   Cath 11/20: total occlusion of mLAD s/p DES. Final images of the LAD demonstrates apical occlusion 2/2 embolus. LM and RCA w irregularities, no stenosis. 1st marginal very distal 90% stenosis, EF 30 to 35% acutely.  LVEDP 31 mmHg. EF 20% by echo. Cr post cath 3.1-3.4. Now followed by nephrology.   S/P [employer] ICD implant 08/2019   Admitted to [facility] 10/22 with hgb 4.6 and SCr up to 4.2.Transfused 4u RBCs. Underwent EGD/colonoscopy 4mm x 8 mm prepyloric ulcer wuth prepyloric gastritis and duodenitis.    3/25: Today he returns for HF follow up. Overall feeling fine. Drinking a lot of sweet tea & weight is up. He is not SOB walking on flat ground or with ADLs, otherwise not very active. Denies palpitations, abnormal bleeding, CP, dizziness, edema, or PND/Orthopnea. Appetite ok. No fever or chills. Not weighing at home. Taking all medications. BP 110/72 at home.   Admiteed 03/07/24 for a/c CHF 2/2 noncompliance. Initially required BiPAP. He was diuresed and restarted on PO diuretics and GDMT.    He returns today for post hospital follow up. Overall feeling ***. NYHA ***. Reports {Symptoms; cardiac:12860::dyspnea,fatigue}. Denies {Symptoms; cardiac:12860::chest pain,dyspnea,fatigue,near-syncope,orthopnea,palpitations,dizziness,abnormal bleeding}. Able to perform ADLs. Appetite okay. Weight at home ***. BP at home***. Compliant with all medications.  PARAMEDICINE CANDIDATE.   Cardiac Studies: - Echo 11/24 EF 32%, RV ok - Echo 6/23 EF 20-25% RV ok  - Echo 10/22 EF 30-35%  - Echo 3/21: EF 20-25%. RV normal.  Review of systems complete and found to be negative unless  listed in HPI.    Past Medical History:  Diagnosis Date   Acute ST elevation myocardial infarction (STEMI) due to occlusion of mid portion of left anterior descending (LAD) coronary artery (HCC) 03/12/2019   Acute systolic heart failure (HCC)    AICD (automatic cardioverter/defibrillator) present    Arthritis    CHF (congestive heart failure) (HCC)    CKD (chronic kidney disease) stage 4, GFR 15-29 ml/min (HCC)    Diabetes mellitus, type II (HCC)    Dyspnea    with exertionm periodically   Heart attack (HCC) 03/15/2019   anterior MI    History of kidney stones    Hyperparathyroidism    Hypertension 2002   Ischemic cardiomyopathy    Left knee pain    Syncope 04/04/2019   after blood draw   Vitamin D deficiency    Current Outpatient Medications  Medication Sig Dispense Refill   allopurinol  (ZYLOPRIM ) 100 MG tablet Take 100 mg by mouth daily.      ALPRAZolam  (XANAX ) 0.5 MG tablet Take 1 tablet (0.5 mg total) by mouth 3 (three) times daily as needed for anxiety. 90 tablet 2   atorvastatin  (LIPITOR ) 80 MG tablet Take 1 tablet (80 mg total) by mouth daily. 100 tablet 3   BRILINTA  60 MG TABS tablet TAKE (1) TABLET BY MOUTH TWICE DAILY. 180 tablet 3   carvedilol  (COREG ) 12.5 MG tablet TAKE 1 TABLET BY MOUTH TWICE DAILY WITH MEALS 180 tablet 3   cinacalcet  (SENSIPAR ) 30 MG tablet Take 30 mg by mouth daily.     furosemide  (LASIX ) 40 MG tablet Take 1 tablet (40 mg total) by mouth 3 (  three) times a week. Monday, Wednesday and Friday 30 tablet 3   ivabradine  (CORLANOR ) 5 MG TABS tablet TAKE 1 TABLET BY MOUTH TWICE DAILY WITH A MEAL 60 tablet 11   losartan  (COZAAR ) 25 MG tablet Take 0.5 tablets (12.5 mg total) by mouth daily. (Patient not taking: Reported on 03/08/2024) 45 tablet 3   nitroGLYCERIN  (NITROSTAT ) 0.4 MG SL tablet Place 1 tablet (0.4 mg total) under the tongue every 5 (five) minutes as needed. 25 tablet 2   Oxycodone  HCl 10 MG TABS Take 1 tablet (10 mg total) by mouth 2 (two) times  daily as needed (Severe pain). 60 tablet 0   pantoprazole  (PROTONIX ) 40 MG tablet Take 1 tablet (40 mg total) by mouth daily before breakfast. 90 tablet 3   No current facility-administered medications for this visit.   No Known Allergies  Social History   Socioeconomic History   Marital status: Single    Spouse name: Not on file   Number of children: 0   Years of education: Not on file   Highest education level: Some college, no degree  Occupational History   Occupation: retired  Tobacco Use   Smoking status: Former    Current packs/day: 0.00    Average packs/day: 0.5 packs/day for 40.0 years (20.0 ttl pk-yrs)    Types: Cigarettes    Start date: 03/12/1979    Quit date: 03/12/2019    Years since quitting: 5.0   Smokeless tobacco: Never  Vaping Use   Vaping status: Never Used  Substance and Sexual Activity   Alcohol use: No   Drug use: No   Sexual activity: Not Currently  Other Topics Concern   Not on file  Social History Narrative   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       Enjoys: fishing, shopping, working around the house       Diet: working on changes, avoiding fried foods, increased veggies   Caffeine: teas some daily   Water : 6-8 cups daily       Wears seat belt    Does not use phone while driving    Psychologist, sport and 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    Social Drivers of Health   Financial Resource Strain: Low Risk  (01/19/2022)   Overall Financial Resource Strain (CARDIA)    Difficulty of Paying Living Expenses: Not hard at all  Food Insecurity: No Food Insecurity (03/08/2024)   Hunger Vital Sign    Worried About Running Out of Food in the Last Year: Never true    Ran Out of Food in the Last Year: Never true  Transportation Needs: No Transportation Needs (03/08/2024)   PRAPARE - Administrator, Civil Service (Medical): No    Lack of Transportation (Non-Medical): No  Physical Activity: Sufficiently Active (01/19/2022)   Exercise Vital Sign    Days of Exercise per  Week: 7 days    Minutes of Exercise per Session: 30 min  Stress: No Stress Concern Present (01/19/2022)   [employer] of Occupational Health - Occupational Stress Questionnaire    Feeling of Stress : Not at all  Social Connections: Moderately Integrated (03/08/2024)   Social Connection and Isolation Panel    Frequency of Communication with Friends and Family: More than three times a week    Frequency of Social Gatherings with Friends and Family: Three times a week    Attends Religious Services: More than 4 times per year    Active Member of Clubs or Organizations: Yes  Attends Banker Meetings: More than 4 times per year    Marital Status: Never married  Intimate Partner Violence: Not At Risk (03/08/2024)   Humiliation, Afraid, Rape, and Kick questionnaire    Fear of Current or Ex-Partner: No    Emotionally Abused: No    Physically Abused: No    Sexually Abused: No   Family History  Problem Relation Age of Onset   Heart failure Mother    Heart disease Mother    Cancer Mother        breast   Heart attack Father    Hypertension Father    Heart disease Father    Diabetes Brother    There were no vitals taken for this visit.  Wt Readings from Last 3 Encounters:  03/10/24 80.8 kg (178 lb 2.1 oz)  01/21/24 85.3 kg (188 lb)  07/27/23 98.9 kg (218 lb)   PHYSICAL EXAM: General: *** appearing. No distress  Cardiac: JVP ~*** cm. No murmurs  Resp: Lung sounds clear and equal B/L Abdomen: Soft, non-distended.  Extremities: Warm and dry.  *** edema.  Neuro: A&O x3. Affect pleasant.   Device interrogation: unable to interrogate device in clinic today  ReDs reading: 36 %, abnormal  ASSESSMENT & PLAN:  2. Chronic Systolic HF - EF 79%. Following anteriorlateral stemi - Echo 3/21: EF 20-25%. RV ok.  - Echo 10/22: EF 30-35% in setting of severe anemia - Has [employer] ICD.  - Echo 11/24: EF 32%, RV ok - NYHA II, volume up a bit. ReDs 36%, weight up. -  Increase Lasix  40 mg daily x 5 days, then back to 40 mg MWF - Stop telmisartan (not a HF ARB), and start losartan  12.5 mg daily. - Continue Coreg  12.5 mg bid - Continue ivabradine  5 mg bid.  - Not candidate for ARNI, MRA or SGLT2i with CKD IV. - I worry his HF is progressing. Likely not candidate for heart-kidney transplant given age - Labs today.  1. CAD  - s/p acute anterolateral STEMI 03/12/19 - s/p DES LAD 11/20 - No s/s angina - Continue ASA 81 + Brilinta  - Continue atorvastatin  80 mg  3. CKD IV - Due to HTN nephropathy - SCr ranges 2.8-3.8 (GFR has ranged 10-20) - Follows with Dr. Hilaria.  - Labs today.  4. HTN - BP elevated in clinic, but just took AM meds - Do not want to drop BP too low with CKD IV. - Consider adding low-dose BiDil  back.  5. Tobacco use - Quit after MI.  - Congratulated on continued cessation.  Follow up in 3 months with Dr. Cherrie  Clifford Chianese, NP  7:33 AM

## 2024-03-20 ENCOUNTER — Ambulatory Visit: Attending: Internal Medicine

## 2024-03-20 DIAGNOSIS — I255 Ischemic cardiomyopathy: Secondary | ICD-10-CM

## 2024-03-21 LAB — CUP PACEART REMOTE DEVICE CHECK
Battery Remaining Longevity: 138 mo
Battery Remaining Percentage: 97 %
Brady Statistic RV Percent Paced: 0 %
Date Time Interrogation Session: 20251117184200
HighPow Impedance: 63 Ohm
Implantable Lead Connection Status: 753985
Implantable Lead Implant Date: 20210422
Implantable Lead Location: 753860
Implantable Lead Model: 138
Implantable Lead Serial Number: 302700
Implantable Pulse Generator Implant Date: 20210422
Lead Channel Impedance Value: 413 Ohm
Lead Channel Setting Pacing Amplitude: 2.5 V
Lead Channel Setting Pacing Pulse Width: 0.4 ms
Lead Channel Setting Sensing Sensitivity: 0.5 mV
Pulse Gen Serial Number: 209495
Zone Setting Status: 755011

## 2024-03-22 ENCOUNTER — Ambulatory Visit: Payer: Self-pay | Admitting: Internal Medicine

## 2024-03-22 NOTE — Progress Notes (Signed)
 Remote ICD Transmission

## 2024-03-27 ENCOUNTER — Other Ambulatory Visit: Payer: Self-pay | Admitting: Internal Medicine

## 2024-03-27 DIAGNOSIS — M170 Bilateral primary osteoarthritis of knee: Secondary | ICD-10-CM

## 2024-03-27 DIAGNOSIS — G894 Chronic pain syndrome: Secondary | ICD-10-CM

## 2024-04-10 ENCOUNTER — Encounter: Payer: Self-pay | Admitting: Podiatry

## 2024-04-10 ENCOUNTER — Ambulatory Visit (INDEPENDENT_AMBULATORY_CARE_PROVIDER_SITE_OTHER): Admitting: Podiatry

## 2024-04-10 DIAGNOSIS — M79675 Pain in left toe(s): Secondary | ICD-10-CM

## 2024-04-10 DIAGNOSIS — E1121 Type 2 diabetes mellitus with diabetic nephropathy: Secondary | ICD-10-CM

## 2024-04-10 DIAGNOSIS — B351 Tinea unguium: Secondary | ICD-10-CM

## 2024-04-10 DIAGNOSIS — M79674 Pain in right toe(s): Secondary | ICD-10-CM

## 2024-04-10 NOTE — Progress Notes (Signed)
 This patient returns to my office for at risk foot care.  This patient requires this care by a professional since this patient will be at risk due to having diabetes and CKD. This patient is unable to cut nails himself since the patient cannot reach his nails.These nails are painful walking and wearing shoes.  This patient presents for at risk foot care today.  General Appearance  Alert, conversant and in no acute stress.  Vascular  Dorsalis pedis and posterior tibial  pulses are weakly/absent palpable  bilaterally.  Capillary return is within normal limits  bilaterally. Temperature is within normal limits  bilaterally.  Neurologic  Senn-Weinstein monofilament wire test within normal limits  bilaterally. Muscle power within normal limits bilaterally.  Nails Thick disfigured discolored nails with subungual debris  from hallux to fifth toes bilaterally. No evidence of bacterial infection or drainage bilaterally.  Orthopedic  No limitations of motion  feet .  No crepitus or effusions noted.  No bony pathology or digital deformities noted.  Skin  normotropic skin with no porokeratosis noted bilaterally.  No signs of infections or ulcers noted.     Onychomycosis  Pain in right toes  Pain in left toes  Consent was obtained for treatment procedures.   Mechanical debridement of nails 1-5  bilaterally performed with a nail nipper.  Filed with dremel without incident.    Return office visit    4 months                  Told patient to return for periodic foot care and evaluation due to potential at risk complications.   Cordella Bold DPM

## 2024-04-11 ENCOUNTER — Telehealth (HOSPITAL_COMMUNITY): Payer: Self-pay

## 2024-04-11 NOTE — Telephone Encounter (Signed)
 Called to confirm/remind patient of their appointment at the [facility] on 04/12/24.   Appointment:   [] Confirmed  [] Left mess   [] No answer/No voice mail  [] VM Full/unable to leave message  [x] Phone not in service

## 2024-04-11 NOTE — Progress Notes (Incomplete)
 ADVANCED HF CLINIC NOTE   Primary Cardiologist: Dr. Wonda Primary Care: Tobie Suzzane POUR, MD Nephrologist: Dr. Patience AHF: Dr. Cherrie   HPI: [name] is a 68 y.o. male, former smoker with HTN, CKD IV, CAD s/p large anterolateral STEMI 9/20 and systolic HF. Has [employer] ICD.   Cath 03/12/2019 Total occlusion of the mid LAD treated with angioplasty and stenting using a 4.0 x 22 Onyx deployed at 14 atm.  0% stenosis was noted post procedure and TIMI grade III flow increased from 0 to 3.  The final angiographic images of the LAD demonstrates apical occlusion due to embolus. Luminal irregularities in the left main but no agreeable stenosis. Large circumflex with 3 obtuse marginals.  The first marginal is large and has a very distal 90% stenosis in 2 branches.  There is a trifurcation distally in the first marginal. RCA Moderate diffuse luminal irregularities but widely patent. Mid anterior wall to apical akinesis.  EF 30 to 35% acutely.  LVEDP 31 mmHg.  Post cath had low BP and shock but recovered. EF 20% by echo. No significant MR. Creatinine post cath 3.1-3.4. Now followed by nephrology.  Echo 3/21. EF 20-25%. RV normal.  S/P [employer] ICD implant 08/2019   Admitted to [facility] 10/22 with hgb 4.6 and SCr up to 4.2.Transfused 4u RBCs. Underwent EGD/colonoscopy 4mm x 8 mm prepyloric ulcer wuth prepyloric gastritis and duodenitis.   Echo 10/22 EF 30-35%   Echo 6/23 EF 20-25% RV ok   Echo 11/24 EF 32%, RV ok  Today he returns for HF follow up. Overall feeling fine. Drinking a lot of sweet tea & weight is up. He is not SOB walking on flat ground or with ADLs, otherwise not very active. Denies palpitations, abnormal bleeding, CP, dizziness, edema, or PND/Orthopnea. Appetite ok. No fever or chills. Not weighing at home. Taking all medications. BP 110/72 at home.  Review of systems complete and found to be negative unless listed in HPI.    Past Medical History:   Diagnosis Date   Acute ST elevation myocardial infarction (STEMI) due to occlusion of mid portion of left anterior descending (LAD) coronary artery (HCC) 03/12/2019   Acute systolic heart failure (HCC)    AICD (automatic cardioverter/defibrillator) present    Arthritis    CHF (congestive heart failure) (HCC)    CKD (chronic kidney disease) stage 4, GFR 15-29 ml/min (HCC)    Diabetes mellitus, type II (HCC)    Dyspnea    with exertionm periodically   Heart attack (HCC) 03/15/2019   anterior MI    History of kidney stones    Hyperparathyroidism    Hypertension 2002   Ischemic cardiomyopathy    Left knee pain    Syncope 04/04/2019   after blood draw   Vitamin D deficiency    Current Outpatient Medications  Medication Sig Dispense Refill   allopurinol  (ZYLOPRIM ) 100 MG tablet Take 100 mg by mouth daily.      ALPRAZolam  (XANAX ) 0.5 MG tablet Take 1 tablet (0.5 mg total) by mouth 3 (three) times daily as needed for anxiety. 90 tablet 2   atorvastatin  (LIPITOR ) 80 MG tablet Take 1 tablet (80 mg total) by mouth daily. 100 tablet 3   BRILINTA  60 MG TABS tablet TAKE (1) TABLET BY MOUTH TWICE DAILY. 180 tablet 3   carvedilol  (COREG ) 12.5 MG tablet TAKE 1 TABLET BY MOUTH TWICE DAILY WITH MEALS 180 tablet 3   cinacalcet  (SENSIPAR ) 30 MG tablet Take 30 mg by  mouth daily.     furosemide  (LASIX ) 40 MG tablet Take 1 tablet (40 mg total) by mouth 3 (three) times a week. Monday, Wednesday and Friday 30 tablet 3   ivabradine  (CORLANOR ) 5 MG TABS tablet TAKE 1 TABLET BY MOUTH TWICE DAILY WITH A MEAL 60 tablet 11   losartan  (COZAAR ) 25 MG tablet Take 0.5 tablets (12.5 mg total) by mouth daily. (Patient not taking: Reported on 03/08/2024) 45 tablet 3   nitroGLYCERIN  (NITROSTAT ) 0.4 MG SL tablet Place 1 tablet (0.4 mg total) under the tongue every 5 (five) minutes as needed. 25 tablet 2   Oxycodone  HCl 10 MG TABS Take 1 tablet (10 mg total) by mouth 2 (two) times daily as needed (Severe pain). 60 tablet 0    pantoprazole  (PROTONIX ) 40 MG tablet Take 1 tablet (40 mg total) by mouth daily before breakfast. 90 tablet 3   No current facility-administered medications for this visit.   No Known Allergies  Social History   Socioeconomic History   Marital status: Single    Spouse name: Not on file   Number of children: 0   Years of education: Not on file   Highest education level: Some college, no degree  Occupational History   Occupation: retired  Tobacco Use   Smoking status: Former    Current packs/day: 0.00    Average packs/day: 0.5 packs/day for 40.0 years (20.0 ttl pk-yrs)    Types: Cigarettes    Start date: 03/12/1979    Quit date: 03/12/2019    Years since quitting: 5.0   Smokeless tobacco: Never  Vaping Use   Vaping status: Never Used  Substance and Sexual Activity   Alcohol use: No   Drug use: No   Sexual activity: Not Currently  Other Topics Concern   Not on file  Social History Narrative   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       Enjoys: fishing, shopping, working around the house       Diet: working on changes, avoiding fried foods, increased veggies   Caffeine: teas some daily   Water : 6-8 cups daily       Wears seat belt    Does not use phone while driving    Psychologist, sport and 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    Social Drivers of Health   Financial Resource Strain: Low Risk  (01/19/2022)   Overall Financial Resource Strain (CARDIA)    Difficulty of Paying Living Expenses: Not hard at all  Food Insecurity: No Food Insecurity (03/08/2024)   Hunger Vital Sign    Worried About Running Out of Food in the Last Year: Never true    Ran Out of Food in the Last Year: Never true  Transportation Needs: No Transportation Needs (03/08/2024)   PRAPARE - Administrator, Civil Service (Medical): No    Lack of Transportation (Non-Medical): No  Physical Activity: Sufficiently Active (01/19/2022)   Exercise Vital Sign    Days of Exercise per Week: 7 days    Minutes of Exercise per  Session: 30 min  Stress: No Stress Concern Present (01/19/2022)   [employer] of Occupational Health - Occupational Stress Questionnaire    Feeling of Stress : Not at all  Social Connections: Moderately Integrated (03/08/2024)   Social Connection and Isolation Panel    Frequency of Communication with Friends and Family: More than three times a week    Frequency of Social Gatherings with Friends and Family: Three times a week    Attends Religious  Services: More than 4 times per year    Active Member of Clubs or Organizations: Yes    Attends Banker Meetings: More than 4 times per year    Marital Status: Never married  Intimate Partner Violence: Not At Risk (03/08/2024)   Humiliation, Afraid, Rape, and Kick questionnaire    Fear of Current or Ex-Partner: No    Emotionally Abused: No    Physically Abused: No    Sexually Abused: No   Family History  Problem Relation Age of Onset   Heart failure Mother    Heart disease Mother    Cancer Mother        breast   Heart attack Father    Hypertension Father    Heart disease Father    Diabetes Brother    There were no vitals taken for this visit.  Wt Readings from Last 3 Encounters:  03/10/24 80.8 kg (178 lb 2.1 oz)  01/21/24 85.3 kg (188 lb)  07/27/23 98.9 kg (218 lb)   PHYSICAL EXAM: General:  NAD. No resp difficulty, walked into clinic HEENT: Normal Neck: Supple. JVP 8-10 Cor: Regular rate & rhythm. No rubs, gallops or murmurs. Lungs: Clear Abdomen: Soft, obese, nontender, nondistended.  Extremities: No cyanosis, clubbing, rash, edema Neuro: Alert & oriented x 3, moves all 4 extremities w/o difficulty. Affect pleasant.  Device interrogation: unable to interrogate device in clinic today  ReDs reading: 36 %, abnormal  ASSESSMENT & PLAN: 1. CAD  - s/p acute anterolateral STEMI 03/12/19 - s/p DES LAD 11/20 - No s/s angina - Continue ASA 81 + Brilinta  - Continue atorvastatin  80 mg  2. Chronic Systolic HF -  EF 20%. Following anteriorlateral stemi - Echo 3/21: EF 20-25%. RV ok.  - Echo 10/22: EF 30-35% in setting of severe anemia - Has [employer] ICD.  - Echo 11/24: EF 32%, RV ok - NYHA II, volume up a bit. ReDs 36%, weight up. - Increase Lasix  40 mg daily x 5 days, then back to 40 mg MWF - Stop telmisartan (not a HF ARB), and start losartan  12.5 mg daily. - Continue Coreg  12.5 mg bid - Continue ivabradine  5 mg bid.  - Not candidate for ARNI, MRA or SGLT2i with CKD IV. - I worry his HF is progressing. Likely not candidate for heart-kidney transplant given age - Labs today.  3. CKD IV - Due to HTN nephropathy - SCr ranges 2.8-3.8 (GFR has ranged 10-20) - Follows with Dr. Hilaria.  - Labs today.  4. HTN - BP elevated in clinic, but just took AM meds - Do not want to drop BP too low with CKD IV. - Consider adding low-dose BiDil  back.  5. Tobacco use - Quit after MI.  - Congratulated on continued cessation.  Follow up in 3 months with Dr. Cherrie  Harlene CHRISTELLA Gainer, FNP  1:53 PM

## 2024-04-12 ENCOUNTER — Ambulatory Visit (HOSPITAL_COMMUNITY): Payer: Self-pay | Admitting: Family Medicine

## 2024-04-12 ENCOUNTER — Ambulatory Visit (HOSPITAL_COMMUNITY)
Admission: RE | Admit: 2024-04-12 | Discharge: 2024-04-12 | Disposition: A | Discharge status: Home / Self Care | Source: Ambulatory Visit | Attending: Family Medicine | Admitting: Family Medicine

## 2024-04-12 ENCOUNTER — Encounter (HOSPITAL_COMMUNITY): Payer: Self-pay

## 2024-04-12 VITALS — BP 124/80 | HR 76 | Ht 72.0 in | Wt 191.0 lb

## 2024-04-12 DIAGNOSIS — I2510 Atherosclerotic heart disease of native coronary artery without angina pectoris: Secondary | ICD-10-CM | POA: Insufficient documentation

## 2024-04-12 DIAGNOSIS — Z87891 Personal history of nicotine dependence: Secondary | ICD-10-CM | POA: Diagnosis not present

## 2024-04-12 DIAGNOSIS — I130 Hypertensive heart and chronic kidney disease with heart failure and stage 1 through stage 4 chronic kidney disease, or unspecified chronic kidney disease: Secondary | ICD-10-CM | POA: Insufficient documentation

## 2024-04-12 DIAGNOSIS — N184 Chronic kidney disease, stage 4 (severe): Secondary | ICD-10-CM | POA: Insufficient documentation

## 2024-04-12 DIAGNOSIS — Z7902 Long term (current) use of antithrombotics/antiplatelets: Secondary | ICD-10-CM | POA: Insufficient documentation

## 2024-04-12 DIAGNOSIS — Z955 Presence of coronary angioplasty implant and graft: Secondary | ICD-10-CM | POA: Insufficient documentation

## 2024-04-12 DIAGNOSIS — Z79899 Other long term (current) drug therapy: Secondary | ICD-10-CM | POA: Insufficient documentation

## 2024-04-12 DIAGNOSIS — I5022 Chronic systolic (congestive) heart failure: Secondary | ICD-10-CM | POA: Diagnosis not present

## 2024-04-12 DIAGNOSIS — I252 Old myocardial infarction: Secondary | ICD-10-CM | POA: Insufficient documentation

## 2024-04-12 DIAGNOSIS — I10 Essential (primary) hypertension: Secondary | ICD-10-CM

## 2024-04-12 DIAGNOSIS — E1122 Type 2 diabetes mellitus with diabetic chronic kidney disease: Secondary | ICD-10-CM | POA: Insufficient documentation

## 2024-04-12 DIAGNOSIS — Z7982 Long term (current) use of aspirin: Secondary | ICD-10-CM | POA: Insufficient documentation

## 2024-04-12 LAB — BASIC METABOLIC PANEL WITH GFR
Anion gap: 8 (ref 5–15)
BUN: 25 mg/dL — ABNORMAL HIGH (ref 8–23)
CO2: 22 mmol/L (ref 22–32)
Calcium: 9.4 mg/dL (ref 8.9–10.3)
Chloride: 113 mmol/L — ABNORMAL HIGH (ref 98–111)
Creatinine, Ser: 2.76 mg/dL — ABNORMAL HIGH (ref 0.61–1.24)
GFR, Estimated: 25 mL/min — ABNORMAL LOW (ref >60)
Glucose, Bld: 97 mg/dL (ref 70–99)
Potassium: 3.5 mmol/L (ref 3.5–5.1)
Sodium: 143 mmol/L (ref 135–145)

## 2024-04-12 LAB — BRAIN NATRIURETIC PEPTIDE: B Natriuretic Peptide: 1015.9 pg/mL — ABNORMAL HIGH (ref 0.0–100.0)

## 2024-04-12 MED ORDER — FUROSEMIDE 40 MG PO TABS: 40.0000 mg | ORAL_TABLET | Freq: Every day | ORAL | 1 refills | Status: AC

## 2024-04-12 NOTE — Patient Instructions (Addendum)
 Good to see you today!  INCREASE lasix  to 40 mg ( tablet) Twice daily for 3 days- then 40 mg daily  Labs done today, your results will be available in MyChart, we will contact you for abnormal readings.  Your physician recommends that you schedule a follow-up appointment as scheduled  If you have any questions or concerns before your next appointment please send us  a message through [place] or call our office at [phone].    TO LEAVE A MESSAGE FOR THE NURSE SELECT OPTION 2, PLEASE LEAVE A MESSAGE INCLUDING: YOUR NAME DATE OF BIRTH CALL BACK NUMBER REASON FOR CALL**this is important as we prioritize the call backs  YOU WILL RECEIVE A CALL BACK THE SAME DAY AS LONG AS YOU CALL BEFORE 4:00 PM At the [facility], you and your health needs are our priority. As part of our continuing mission to provide you with exceptional heart care, we have created designated Provider Care Teams. These Care Teams include your primary Cardiologist (physician) and Advanced Practice Providers (APPs- Physician Assistants and Nurse Practitioners) who all work together to provide you with the care you need, when you need it.   You may see any of the following providers on your designated Care Team at your next follow up: Dr Toribio Fuel Dr Ezra Shuck Dr. Morene Brownie Greig Mosses, NP Caffie Shed, [place] [facility] [place], [place] Beckey Coe, NP Jordan Lee, NP Ellouise Class, NP Tinnie Redman, PharmD Jaun Bash, PharmD   Please be sure to bring in all your medications bottles to every appointment.    Thank you for choosing [facility]

## 2024-04-12 NOTE — Addendum Note (Signed)
 Encounter addended by: Glena Harlene HERO, FNP on: 04/12/2024 12:20 PM  Actions taken: Clinical Note Signed

## 2024-04-17 ENCOUNTER — Other Ambulatory Visit: Payer: Self-pay | Admitting: Internal Medicine

## 2024-04-17 DIAGNOSIS — F411 Generalized anxiety disorder: Secondary | ICD-10-CM

## 2024-04-18 NOTE — Progress Notes (Signed)
 Pharmacy Quality Measure Review  This patient is appearing on a report for being at risk of failing the adherence measure for cholesterol (statin) medications this calendar year.   Medication: atorvastatin  80 mg Last fill date: 04/03/24 for 90 day supply  Insurance report was not up to date. No action needed at this time.   Jenkins Graces, PharmD PGY1 Pharmacy Resident [phone]

## 2024-04-24 ENCOUNTER — Other Ambulatory Visit: Payer: Self-pay | Admitting: Internal Medicine

## 2024-04-24 DIAGNOSIS — G894 Chronic pain syndrome: Secondary | ICD-10-CM

## 2024-04-24 DIAGNOSIS — M170 Bilateral primary osteoarthritis of knee: Secondary | ICD-10-CM

## 2024-04-28 ENCOUNTER — Ambulatory Visit (HOSPITAL_COMMUNITY)

## 2024-04-28 NOTE — Progress Notes (Incomplete)
 "  ADVANCED HF CLINIC NOTE    PCP: Tobie Suzzane POUR, MD Primary Cardiologist: Dr. Wonda Nephrologist: Dr. Patience HF Cardiologist: Dr. Cherrie   HPI: [name] is a 68 y.o. male, former smoker with HTN, CKD IV, CAD s/p large anterolateral STEMI 9/20 and systolic HF. Has [employer] ICD.   Cath 03/12/2019 Total occlusion of the mid LAD treated with angioplasty and stenting using a 4.0 x 22 Onyx deployed at 14 atm.  0% stenosis was noted post procedure and TIMI grade III flow increased from 0 to 3.  The final angiographic images of the LAD demonstrates apical occlusion due to embolus. Luminal irregularities in the left main but no agreeable stenosis. Large circumflex with 3 obtuse marginals.  The first marginal is large and has a very distal 90% stenosis in 2 branches.  There is a trifurcation distally in the first marginal. RCA Moderate diffuse luminal irregularities but widely patent. Mid anterior wall to apical akinesis.  EF 30 to 35% acutely.  LVEDP 31 mmHg.  Post cath had low BP and shock but recovered. EF 20% by echo. No significant MR. Creatinine post cath 3.1-3.4. Now followed by nephrology.  Echo 3/21. EF 20-25%. RV normal.  S/p [employer] ICD implant 08/2019   Admitted to [facility] 10/22 with hgb 4.6 and SCr up to 4.2.Transfused 4u RBCs. Underwent EGD/colonoscopy 4mm x 8 mm prepyloric ulcer wuth prepyloric gastritis and duodenitis.   Admitted 11/25 with a/c HF, had not been compliant with meds at home & significant dietary indescretion. Diuresed and GDMT titrated. Echo showed EF 20-25%, G3DD, RV ok, moderate MR. Discharged home, weight 178 lbs  Today he returns for HF follow up. He was seen a  few weeks ago in the HF clinic. Lasix  increased x 3 days. Overall feeling fine. Denies SOB/PND/Orthopnea. Appetite ok. No fever or chills. Weight at home  pounds. Taking all medications  Cardiac Test Reviewed Echo 3/21. EF 20-25%. RV normal. Echo 10/22 EF 30-35%  Echo 6/23  EF 20-25% RV ok  Echo 11/24 EF 32%, RV ok Echo 211/2025 LVEF 20-25% Grade 3DD RV ok  Review of systems complete and found to be negative unless listed in HPI.    Past Medical History:  Diagnosis Date   Acute ST elevation myocardial infarction (STEMI) due to occlusion of mid portion of left anterior descending (LAD) coronary artery (HCC) 03/12/2019   Acute systolic heart failure (HCC)    AICD (automatic cardioverter/defibrillator) present    Arthritis    CHF (congestive heart failure) (HCC)    CKD (chronic kidney disease) stage 4, GFR 15-29 ml/min (HCC)    Diabetes mellitus, type II (HCC)    Dyspnea    with exertionm periodically   Heart attack (HCC) 03/15/2019   anterior MI    History of kidney stones    Hyperparathyroidism    Hypertension 2002   Ischemic cardiomyopathy    Left knee pain    Syncope 04/04/2019   after blood draw   Vitamin D deficiency    Current Outpatient Medications  Medication Sig Dispense Refill   allopurinol  (ZYLOPRIM ) 100 MG tablet Take 100 mg by mouth daily.      ALPRAZolam  (XANAX ) 0.5 MG tablet Take 1 tablet (0.5 mg total) by mouth 3 (three) times daily as needed for anxiety. 90 tablet 2   atorvastatin  (LIPITOR ) 80 MG tablet Take 1 tablet (80 mg total) by mouth daily. 100 tablet 3   BRILINTA  60 MG TABS tablet TAKE (1) TABLET BY MOUTH  TWICE DAILY. 180 tablet 3   carvedilol  (COREG ) 12.5 MG tablet TAKE 1 TABLET BY MOUTH TWICE DAILY WITH MEALS 180 tablet 3   cinacalcet  (SENSIPAR ) 30 MG tablet Take 30 mg by mouth daily.     furosemide  (LASIX ) 40 MG tablet Take 1 tablet (40 mg total) by mouth daily. 90 tablet 1   ivabradine  (CORLANOR ) 5 MG TABS tablet TAKE 1 TABLET BY MOUTH TWICE DAILY WITH A MEAL 60 tablet 11   losartan  (COZAAR ) 25 MG tablet Take 0.5 tablets (12.5 mg total) by mouth daily. (Patient not taking: Reported on 04/12/2024) 45 tablet 3   nitroGLYCERIN  (NITROSTAT ) 0.4 MG SL tablet Place 1 tablet (0.4 mg total) under the tongue every 5 (five) minutes  as needed. 25 tablet 2   [START ON 05/02/2024] Oxycodone  HCl 10 MG TABS Take 1 tablet (10 mg total) by mouth 2 (two) times daily as needed (Severe pain). 60 tablet 0   pantoprazole  (PROTONIX ) 40 MG tablet Take 1 tablet (40 mg total) by mouth daily before breakfast. 90 tablet 3   No current facility-administered medications for this visit.   No Known Allergies  Social History   Socioeconomic History   Marital status: Single    Spouse name: Not on file   Number of children: 0   Years of education: Not on file   Highest education level: Some college, no degree  Occupational History   Occupation: retired  Tobacco Use   Smoking status: Former    Current packs/day: 0.00    Average packs/day: 0.5 packs/day for 40.0 years (20.0 ttl pk-yrs)    Types: Cigarettes    Start date: 03/12/1979    Quit date: 03/12/2019    Years since quitting: 5.1   Smokeless tobacco: Never  Vaping Use   Vaping status: Never Used  Substance and Sexual Activity   Alcohol use: No   Drug use: No   Sexual activity: Not Currently  Other Topics Concern   Not on file  Social History Narrative   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       Enjoys: fishing, shopping, working around the house       Diet: working on changes, avoiding fried foods, increased veggies   Caffeine: teas some daily   Water : 6-8 cups daily       Wears seat belt    Does not use phone while driving    Smoke detectors at home    Social Drivers of Health   Tobacco Use: Medium Risk (04/12/2024)   Patient History    Smoking Tobacco Use: Former    Smokeless Tobacco Use: Never    Passive Exposure: Not on Actuary Strain: Low Risk (01/19/2022)   Overall Financial Resource Strain (CARDIA)    Difficulty of Paying Living Expenses: Not hard at all  Food Insecurity: No Food Insecurity (03/08/2024)   Epic    Worried About Radiation Protection Practitioner of Food in the Last Year: Never true    Ran Out of Food in the Last Year: Never true   Transportation Needs: No Transportation Needs (03/08/2024)   Epic    Lack of Transportation (Medical): No    Lack of Transportation (Non-Medical): No  Physical Activity: Sufficiently Active (01/19/2022)   Exercise Vital Sign    Days of Exercise per Week: 7 days    Minutes of Exercise per Session: 30 min  Stress: No Stress Concern Present (01/19/2022)   [employer] of Occupational Health - Occupational Stress Questionnaire  Feeling of Stress : Not at all  Social Connections: Moderately Integrated (03/08/2024)   Social Connection and Isolation Panel    Frequency of Communication with Friends and Family: More than three times a week    Frequency of Social Gatherings with Friends and Family: Three times a week    Attends Religious Services: More than 4 times per year    Active Member of Clubs or Organizations: Yes    Attends Banker Meetings: More than 4 times per year    Marital Status: Never married  Intimate Partner Violence: Not At Risk (03/08/2024)   Epic    Fear of Current or Ex-Partner: No    Emotionally Abused: No    Physically Abused: No    Sexually Abused: No  Depression (PHQ2-9): Low Risk (01/21/2024)   Depression (PHQ2-9)    PHQ-2 Score: 0  Alcohol Screen: Low Risk (01/19/2022)   Alcohol Screen    Last Alcohol Screening Score (AUDIT): 0  Housing: Low Risk (03/08/2024)   Epic    Unable to Pay for Housing in the Last Year: No    Number of Times Moved in the Last Year: 0    Homeless in the Last Year: No  Utilities: Not At Risk (03/08/2024)   Epic    Threatened with loss of utilities: No  Health Literacy: Not on file   Family History  Problem Relation Age of Onset   Heart failure Mother    Heart disease Mother    Cancer Mother        breast   Heart attack Father    Hypertension Father    Heart disease Father    Diabetes Brother     Wt Readings from Last 3 Encounters:  04/12/24 86.6 kg (191 lb)  03/10/24 80.8 kg (178 lb 2.1 oz)  01/21/24 85.3  kg (188 lb)   There were no vitals taken for this visit.  PHYSICAL EXAM: General:   No resp difficulty Neck: no JVD.  Cor: Regular rate & rhythm.  Lungs: clear Abdomen: soft, nontender, nondistended.  Extremities: no  edema Neuro: alert & oriented x3  ReDs reading:   Device interrogation:    ASSESSMENT & PLAN:  1. Chronic Systolic HF - EF 79%. Following anteriorlateral stemi - Echo 3/21: EF 20-25%. RV ok.  - Echo 10/22: EF 30-35% in setting of severe anemia - Has [employer] ICD.  - Echo 11/24: EF 32%, RV ok - Echo 11/25: EF 25% - NYHA -Volume status  Continue lasix  40 mg daily. . - Continue Coreg  12.5 mg bid - Continue ivabradine  5 mg bid.  - Not a candidate for LVAD with CKD stage IV.   2. CAD  - s/p acute anterolateral STEMI 03/12/19 - s/p DES LAD 11/20 -No chest pain.  - Continue ASA 81 + Brilinta  - Continue atorvastatin  80 mg daily.  3. CKD IV - Due to HTN nephropathy - SCr ranges 2.8-3.4 (GFR has ranged 10-20) - Follows with Dr. Rachele.   4. HTN - Do not want to drop BP too low with CKD IV. -  5. Tobacco use - Quit after MI.  - Congratulated on continued cessation.    Follow up in ***   Clifford Mosses, NP  7:41 AM  "

## 2024-04-29 ENCOUNTER — Encounter (HOSPITAL_COMMUNITY): Payer: Self-pay | Admitting: Internal Medicine

## 2024-04-29 ENCOUNTER — Emergency Department (HOSPITAL_COMMUNITY)

## 2024-04-29 ENCOUNTER — Observation Stay (HOSPITAL_COMMUNITY)
Admission: EM | Admit: 2024-04-29 | Discharge: 2024-05-01 | Disposition: A | Discharge status: Home / Self Care | Attending: Family Medicine | Admitting: Family Medicine

## 2024-04-29 ENCOUNTER — Observation Stay (HOSPITAL_COMMUNITY)

## 2024-04-29 ENCOUNTER — Other Ambulatory Visit: Payer: Self-pay

## 2024-04-29 DIAGNOSIS — E1122 Type 2 diabetes mellitus with diabetic chronic kidney disease: Secondary | ICD-10-CM | POA: Insufficient documentation

## 2024-04-29 DIAGNOSIS — I255 Ischemic cardiomyopathy: Secondary | ICD-10-CM | POA: Diagnosis not present

## 2024-04-29 DIAGNOSIS — E1121 Type 2 diabetes mellitus with diabetic nephropathy: Secondary | ICD-10-CM | POA: Diagnosis present

## 2024-04-29 DIAGNOSIS — I130 Hypertensive heart and chronic kidney disease with heart failure and stage 1 through stage 4 chronic kidney disease, or unspecified chronic kidney disease: Secondary | ICD-10-CM | POA: Diagnosis not present

## 2024-04-29 DIAGNOSIS — F411 Generalized anxiety disorder: Secondary | ICD-10-CM | POA: Diagnosis not present

## 2024-04-29 DIAGNOSIS — R7989 Other specified abnormal findings of blood chemistry: Secondary | ICD-10-CM | POA: Diagnosis not present

## 2024-04-29 DIAGNOSIS — K219 Gastro-esophageal reflux disease without esophagitis: Secondary | ICD-10-CM | POA: Diagnosis not present

## 2024-04-29 DIAGNOSIS — J9601 Acute respiratory failure with hypoxia: Principal | ICD-10-CM | POA: Diagnosis present

## 2024-04-29 DIAGNOSIS — I2510 Atherosclerotic heart disease of native coronary artery without angina pectoris: Secondary | ICD-10-CM | POA: Diagnosis present

## 2024-04-29 DIAGNOSIS — R0602 Shortness of breath: Secondary | ICD-10-CM | POA: Diagnosis present

## 2024-04-29 DIAGNOSIS — M109 Gout, unspecified: Secondary | ICD-10-CM | POA: Diagnosis not present

## 2024-04-29 DIAGNOSIS — N184 Chronic kidney disease, stage 4 (severe): Secondary | ICD-10-CM | POA: Diagnosis not present

## 2024-04-29 DIAGNOSIS — R079 Chest pain, unspecified: Secondary | ICD-10-CM

## 2024-04-29 DIAGNOSIS — Z87891 Personal history of nicotine dependence: Secondary | ICD-10-CM | POA: Insufficient documentation

## 2024-04-29 DIAGNOSIS — E785 Hyperlipidemia, unspecified: Secondary | ICD-10-CM | POA: Diagnosis present

## 2024-04-29 DIAGNOSIS — Z96652 Presence of left artificial knee joint: Secondary | ICD-10-CM | POA: Diagnosis not present

## 2024-04-29 DIAGNOSIS — I5021 Acute systolic (congestive) heart failure: Secondary | ICD-10-CM | POA: Insufficient documentation

## 2024-04-29 DIAGNOSIS — I10 Essential (primary) hypertension: Secondary | ICD-10-CM | POA: Diagnosis present

## 2024-04-29 DIAGNOSIS — I509 Heart failure, unspecified: Principal | ICD-10-CM

## 2024-04-29 DIAGNOSIS — Z79899 Other long term (current) drug therapy: Secondary | ICD-10-CM | POA: Insufficient documentation

## 2024-04-29 DIAGNOSIS — R0902 Hypoxemia: Secondary | ICD-10-CM

## 2024-04-29 LAB — ECHOCARDIOGRAM LIMITED
Area-P 1/2: 7.66 cm2
Height: 72 in
MV M vel: 5.14 m/s
MV Peak grad: 105.7 mmHg
Radius: 0.7 cm
S' Lateral: 6.05 cm
Single Plane A4C EF: 21.7 %
Weight: 3033.6 [oz_av]

## 2024-04-29 LAB — PRO BRAIN NATRIURETIC PEPTIDE: Pro Brain Natriuretic Peptide: 5125 pg/mL — ABNORMAL HIGH

## 2024-04-29 LAB — TROPONIN T, HIGH SENSITIVITY
Troponin T High Sensitivity: 49 ng/L — ABNORMAL HIGH (ref 0–19)
Troponin T High Sensitivity: 47 ng/L — ABNORMAL HIGH (ref 0–19)

## 2024-04-29 LAB — CBC
HCT: 37.7 % — ABNORMAL LOW (ref 39.0–52.0)
Hemoglobin: 12.5 g/dL — ABNORMAL LOW (ref 13.0–17.0)
MCH: 31.5 pg (ref 26.0–34.0)
MCHC: 33.2 g/dL (ref 30.0–36.0)
MCV: 95 fL (ref 80.0–100.0)
Platelets: 196 K/uL (ref 150–400)
RBC: 3.97 MIL/uL — ABNORMAL LOW (ref 4.22–5.81)
RDW: 14.6 % (ref 11.5–15.5)
WBC: 7.2 K/uL (ref 4.0–10.5)
nRBC: 0 % (ref 0.0–0.2)

## 2024-04-29 LAB — BASIC METABOLIC PANEL WITH GFR
Anion gap: 16 — ABNORMAL HIGH (ref 5–15)
BUN: 34 mg/dL — ABNORMAL HIGH (ref 8–23)
CO2: 15 mmol/L — ABNORMAL LOW (ref 22–32)
Calcium: 10.1 mg/dL (ref 8.9–10.3)
Chloride: 110 mmol/L (ref 98–111)
Creatinine, Ser: 2.69 mg/dL — ABNORMAL HIGH (ref 0.61–1.24)
GFR, Estimated: 25 mL/min — ABNORMAL LOW
Glucose, Bld: 111 mg/dL — ABNORMAL HIGH (ref 70–99)
Potassium: 4.6 mmol/L (ref 3.5–5.1)
Sodium: 141 mmol/L (ref 135–145)

## 2024-04-29 LAB — RESP PANEL BY RT-PCR (RSV, FLU A&B, COVID)  RVPGX2
Influenza A by PCR: NEGATIVE
Influenza B by PCR: NEGATIVE
Resp Syncytial Virus by PCR: NEGATIVE
SARS Coronavirus 2 by RT PCR: NEGATIVE

## 2024-04-29 MED ORDER — ALLOPURINOL 100 MG PO TABS
100.0000 mg | ORAL_TABLET | Freq: Every day | ORAL | Status: DC
  Administered 2024-04-30 – 2024-05-01 (×2): 100 mg via ORAL
  Filled 2024-04-29 (×3): qty 1

## 2024-04-29 MED ORDER — ACETAMINOPHEN 325 MG PO TABS
650.0000 mg | ORAL_TABLET | ORAL | Status: DC | PRN
  Filled 2024-04-29: qty 2

## 2024-04-29 MED ORDER — SODIUM BICARBONATE 650 MG PO TABS
650.0000 mg | ORAL_TABLET | Freq: Two times a day (BID) | ORAL | Status: DC
  Administered 2024-04-29 – 2024-05-01 (×4): 650 mg via ORAL
  Filled 2024-04-29 (×4): qty 1

## 2024-04-29 MED ORDER — ONDANSETRON HCL 4 MG/2ML IJ SOLN: 4.0000 mg | Freq: Four times a day (QID) | INTRAMUSCULAR | Status: DC | PRN

## 2024-04-29 MED ORDER — NITROGLYCERIN 0.4 MG SL SUBL: 0.4000 mg | SUBLINGUAL_TABLET | SUBLINGUAL | Status: DC | PRN

## 2024-04-29 MED ORDER — HEPARIN SODIUM (PORCINE) 5000 UNIT/ML IJ SOLN: 5000.0000 [IU] | Freq: Three times a day (TID) | INTRAMUSCULAR | Status: DC

## 2024-04-29 MED ORDER — SODIUM CHLORIDE 0.9% FLUSH: 3.0000 mL | INTRAVENOUS | Status: DC | PRN

## 2024-04-29 MED ORDER — PERFLUTREN LIPID MICROSPHERE
1.0000 mL | INTRAVENOUS | Status: AC | PRN
  Administered 2024-04-29: 3 mL via INTRAVENOUS

## 2024-04-29 MED ORDER — CINACALCET HCL 30 MG PO TABS
30.0000 mg | ORAL_TABLET | Freq: Every day | ORAL | Status: DC
  Administered 2024-04-30 – 2024-05-01 (×2): 30 mg via ORAL
  Filled 2024-04-29 (×3): qty 1

## 2024-04-29 MED ORDER — ATORVASTATIN CALCIUM 40 MG PO TABS
80.0000 mg | ORAL_TABLET | Freq: Every day | ORAL | Status: DC
  Administered 2024-04-29 – 2024-05-01 (×3): 80 mg via ORAL
  Filled 2024-04-29 (×3): qty 2

## 2024-04-29 MED ORDER — SODIUM CHLORIDE 0.9% FLUSH
3.0000 mL | Freq: Two times a day (BID) | INTRAVENOUS | Status: DC
  Administered 2024-04-29 – 2024-05-01 (×5): 3 mL via INTRAVENOUS

## 2024-04-29 MED ORDER — PANTOPRAZOLE SODIUM 40 MG PO TBEC
40.0000 mg | DELAYED_RELEASE_TABLET | Freq: Every day | ORAL | Status: DC
  Administered 2024-04-30 – 2024-05-01 (×2): 40 mg via ORAL
  Filled 2024-04-29 (×2): qty 1

## 2024-04-29 MED ORDER — HEPARIN SODIUM (PORCINE) 5000 UNIT/ML IJ SOLN
5000.0000 [IU] | Freq: Three times a day (TID) | INTRAMUSCULAR | Status: DC
  Administered 2024-04-29 – 2024-05-01 (×5): 5000 [IU] via SUBCUTANEOUS
  Filled 2024-04-29 (×5): qty 1

## 2024-04-29 MED ORDER — CARVEDILOL 12.5 MG PO TABS
12.5000 mg | ORAL_TABLET | Freq: Two times a day (BID) | ORAL | Status: DC
  Administered 2024-04-29 – 2024-05-01 (×4): 12.5 mg via ORAL
  Filled 2024-04-29: qty 4
  Filled 2024-04-29 (×3): qty 1

## 2024-04-29 MED ORDER — FUROSEMIDE 10 MG/ML IJ SOLN
40.0000 mg | Freq: Once | INTRAMUSCULAR | Status: AC
  Administered 2024-04-29: 40 mg via INTRAVENOUS
  Filled 2024-04-29: qty 4

## 2024-04-29 MED ORDER — SODIUM CHLORIDE 0.9 % IV SOLN: 250.0000 mL | INTRAVENOUS | Status: AC | PRN

## 2024-04-29 MED ORDER — ALPRAZOLAM 0.5 MG PO TABS
0.5000 mg | ORAL_TABLET | Freq: Three times a day (TID) | ORAL | Status: DC | PRN
  Administered 2024-04-29 – 2024-04-30 (×3): 0.5 mg via ORAL
  Filled 2024-04-29 (×3): qty 1

## 2024-04-29 MED ORDER — TICAGRELOR 60 MG PO TABS
60.0000 mg | ORAL_TABLET | Freq: Two times a day (BID) | ORAL | Status: DC
  Administered 2024-04-30 – 2024-05-01 (×3): 60 mg via ORAL
  Filled 2024-04-29 (×5): qty 1

## 2024-04-29 MED ORDER — FUROSEMIDE 10 MG/ML IJ SOLN
40.0000 mg | Freq: Two times a day (BID) | INTRAMUSCULAR | Status: DC
  Administered 2024-04-29 – 2024-05-01 (×4): 40 mg via INTRAVENOUS
  Filled 2024-04-29 (×4): qty 4

## 2024-04-29 MED ORDER — IVABRADINE HCL 5 MG PO TABS
5.0000 mg | ORAL_TABLET | Freq: Two times a day (BID) | ORAL | Status: DC
  Administered 2024-04-30 – 2024-05-01 (×3): 5 mg via ORAL
  Filled 2024-04-29 (×5): qty 1

## 2024-04-29 NOTE — ED Provider Notes (Signed)
 " [place] [facility] AT [facility] Provider Note   CSN: [id number] Arrival date & time: 04/29/24  1218     Patient presents with: Chest Pain   [name] is a 68 y.o. male.   Patient is a 68 year old male with PMH CAD, CHF with ICD in place, HTN, CKD, DM presenting to the emergency department with chest pain and SOB. Patient reports that 2 days ago he did not have shoes on and tripped and fell and landed on the right side of his chest where his ICD is.  He states that he has had pain around his ICD site since then but denies having any shocks from his ICD.  He states that over the last few days he has had worsening shortness of breath on exertion.  He states that he has had a mild dry cough.  He denies any fever or lower extremity swelling.  He states that he has been taking his medications as prescribed.  The history is provided by the patient.  Chest Pain      Prior to Admission medications  Medication Sig Start Date End Date Taking? Authorizing Provider  allopurinol  (ZYLOPRIM ) 100 MG tablet Take 100 mg by mouth daily.  04/25/19 04/12/25  [provider]  ALPRAZolam  (XANAX ) 0.5 MG tablet Take 1 tablet (0.5 mg total) by mouth 3 (three) times daily as needed for anxiety. 04/17/24   Tobie Suzzane POUR, MD  atorvastatin  (LIPITOR ) 80 MG tablet Take 1 tablet (80 mg total) by mouth daily. 09/08/23   Tobie Suzzane POUR, MD  BRILINTA  60 MG TABS tablet TAKE (1) TABLET BY MOUTH TWICE DAILY. 03/02/23   Bensimhon, Toribio SAUNDERS, MD  carvedilol  (COREG ) 12.5 MG tablet TAKE 1 TABLET BY MOUTH TWICE DAILY WITH MEALS 03/02/23   Bensimhon, Toribio SAUNDERS, MD  cinacalcet  (SENSIPAR ) 30 MG tablet Take 30 mg by mouth daily. 12/28/20   [provider]  furosemide  (LASIX ) 40 MG tablet Take 1 tablet (40 mg total) by mouth daily. 04/12/24   Glena Harlene HERO, FNP  ivabradine  (CORLANOR ) 5 MG TABS tablet TAKE 1 TABLET BY MOUTH TWICE DAILY WITH A MEAL 05/11/23   Waddell Danelle ORN, MD   losartan  (COZAAR ) 25 MG tablet Take 0.5 tablets (12.5 mg total) by mouth daily. Patient not taking: Reported on 04/12/2024 07/23/23 01/21/24  Glena Harlene HERO, FNP  nitroGLYCERIN  (NITROSTAT ) 0.4 MG SL tablet Place 1 tablet (0.4 mg total) under the tongue every 5 (five) minutes as needed. 07/23/23   [place], Harlene HERO, FNP  Oxycodone  HCl 10 MG TABS Take 1 tablet (10 mg total) by mouth 2 (two) times daily as needed (Severe pain). 05/02/24   Tobie Suzzane POUR, MD  pantoprazole  (PROTONIX ) 40 MG tablet Take 1 tablet (40 mg total) by mouth daily before breakfast. 03/24/23   Ezzard Sonny RAMAN, PA-C    Allergies: Patient has no known allergies.    Review of Systems  Cardiovascular:  Positive for chest pain.    Updated Vital Signs BP (!) 129/104   Pulse (!) 101   Temp 98 F (36.7 C) (Oral)   Resp 15   Ht 6' (1.829 m)   Wt 86 kg   SpO2 95%   BMI 25.71 kg/m   Physical Exam Vitals and nursing note reviewed.  Constitutional:      General: He is not in acute distress.    Appearance: He is well-developed.  HENT:     Head: Normocephalic and atraumatic.  Eyes:  Extraocular Movements: Extraocular movements intact.  Neck:     Vascular: No JVD.  Cardiovascular:     Rate and Rhythm: Normal rate and regular rhythm.     Heart sounds: Normal heart sounds.  Pulmonary:     Effort: Pulmonary effort is normal.     Breath sounds: Rales (Bilateral bases) present.  Chest:     Chest wall: No tenderness.     Comments: No overlying skin changes over ICD site Abdominal:     Palpations: Abdomen is soft.     Tenderness: There is no abdominal tenderness.  Musculoskeletal:        General: Normal range of motion.     Cervical back: Normal range of motion and neck supple.     Right lower leg: No edema.     Left lower leg: No edema.  Skin:    General: Skin is warm and dry.  Neurological:     General: No focal deficit present.     Mental Status: He is alert and oriented to person, place, and time.   Psychiatric:        Mood and Affect: Mood normal.        Behavior: Behavior normal.     (all labs ordered are listed, but only abnormal results are displayed) Labs Reviewed  BASIC METABOLIC PANEL WITH GFR - Abnormal; Notable for the following components:      Result Value   CO2 15 (*)    Glucose, Bld 111 (*)    BUN 34 (*)    Creatinine, Ser 2.69 (*)    GFR, Estimated 25 (*)    Anion gap 16 (*)    All other components within normal limits  CBC - Abnormal; Notable for the following components:   RBC 3.97 (*)    Hemoglobin 12.5 (*)    HCT 37.7 (*)    All other components within normal limits  PRO BRAIN NATRIURETIC PEPTIDE - Abnormal; Notable for the following components:   Pro Brain Natriuretic Peptide 5,125.0 (*)    All other components within normal limits  TROPONIN T, HIGH SENSITIVITY - Abnormal; Notable for the following components:   Troponin T High Sensitivity 49 (*)    All other components within normal limits  RESP PANEL BY RT-PCR (RSV, FLU A&B, COVID)  [id number]  TROPONIN T, HIGH SENSITIVITY    EKG: EKG Interpretation Date/Time:  Saturday April 29 2024 12:38:45 EST Ventricular Rate:  103 PR Interval:  160 QRS Duration:  88 QT Interval:  388 QTC Calculation: 508 R Axis:   -48  Text Interpretation: Sinus tachycardia with occasional Premature ventricular complexes Possible Left atrial enlargement Left axis deviation Anterior infarct (cited on or before 18-Mar-2021) T wave abnormality, consider lateral ischemia Abnormal ECG When compared with ECG of 12-Apr-2024 10:44, QRS axis Shifted left Criteria for Inferior infarct are no longer Present Serial changes of Anterior infarct Present Confirmed by Ellouise Fine ([id number]) on 04/29/2024 12:52:39 PM  Radiology: DG Chest 2 View Result Date: 04/29/2024 CLINICAL DATA:  Chest pain. Nausea, vomiting, diarrhea, runny nose. Fall on Christmas Eve. EXAM: CHEST - 2 VIEW COMPARISON:  03/09/2024 FINDINGS: Unchanged mild  cardiomegaly. Interval development of pulmonary vascular congestion. LEFT chest wall AICD again seen. Interval development of BILATERAL central airspace opacities most likely due to pulmonary edema. IMPRESSION: Interval development of extensive BILATERAL central airspace opacities, most likely due to pulmonary edema. Electronically Signed   By: Aliene Lloyd M.D.   On: 04/29/2024 14:12     Procedures  Medications Ordered in the ED  furosemide  (LASIX ) injection 40 mg (has no administration in time range)    Clinical Course as of 04/29/24 1440  Sat Apr 29, 2024  1425 CXR with bilateral opacities concerning for pulmonary edema with elevated BNP 5000. Will be started on IV lasix . Mildly elevated troponin, will need repeat. Cr at baseline, mildly increased AGAP. He did de-sat to the 80's and is on 3L [facility]. Will need admission.  [VK]    Clinical Course User Index [VK] Kingsley, Zaliyah Meikle K, DO                                 Medical Decision Making This patient presents to the ED with chief complaint(s) of chest pain, shortness of breath with pertinent past medical history of CHF, CAD, hypertension, diabetes, CKD which further complicates the presenting complaint. The complaint involves an extensive differential diagnosis and also carries with it a high risk of complications and morbidity.    The differential diagnosis includes ACS, arrhythmia, anemia, pneumonia, pneumothorax, pulmonary edema, pleural effusion, viral syndrome, MSK pain  Additional history obtained: Additional history obtained from N/A Records reviewed outpatient cardiology records  ED Course and Reassessment: On patient's arrival he is hemodynamically stable in no acute distress.  EKG on arrival showed normal sinus rhythm without acute ischemic changes.  Patient will have labs including troponin and BNP and viral swab as well as chest x-ray performed and he will be closely reassessed.  Independent labs interpretation:  The  following labs were independently interpreted: BNP 5000, mildly elevated troponin, labs otherwise at baseline  Independent visualization of imaging: - I independently visualized the following imaging with scope of interpretation limited to determining acute life threatening conditions related to emergency care: Chest x-ray, which revealed bilateral lower lobe infiltrates concerning for pulmonary edema  Consultation: - Consulted or discussed management/test interpretation w/ external professional: Hospitalist  Consideration for admission or further workup: Patient requires admission for pulmonary edema and hypoxia. Social Determinants of health: N/A    Amount and/or Complexity of Data Reviewed Labs: ordered. Radiology: ordered.  Risk Prescription drug management. Decision regarding hospitalization.       Final diagnoses:  Acute on chronic congestive heart failure, unspecified heart failure type [facility])  Hypoxia    ED Discharge Orders     None          Kingsley, Dezmond Downie K, DO 04/29/24 1440  "

## 2024-04-29 NOTE — ED Notes (Signed)
 Unsuccessful IV attempt  Labs obtained\

## 2024-04-29 NOTE — ED Notes (Signed)
 SpO2 dropped to 84% on RA. Pt placed on 2L via [place] and Dr. Ellouise was notified.

## 2024-04-29 NOTE — Progress Notes (Signed)
" °  Echocardiogram 2D Echocardiogram has been performed.  Koleen KANDICE Popper, RDCS 04/29/2024, 3:55 PM "

## 2024-04-29 NOTE — H&P (Signed)
 " History and Physical    [name] FMW:[id number] DOB: November 22, 1955 DOA: 04/29/2024  PCP: Tobie Suzzane POUR, MD   Patient coming from: Home  Chief Complaint: Chest pain/shortness of breath with exertion  HPI: [name] is a 68 y.o. male with medical history significant for HFrEF with EF 20-25%, CAD, CKD stage IV, and GERD who presented to the ED with complaints of chest pain and shortness of breath.  He apparently fell 2 days ago when he did not have his shoes on and tripped and landed to the right side of his chest where his ICD is.  He states that he had pain around his ICD site but denies any shocks and over the last few days noted some worsening shortness of breath with exertion along with some mild dry cough.  He denies any fevers or chills or lower extremity edema and states that he has been compliant with his home medications.   ED Course: Vital signs with some tachycardia noted and elevated blood pressures.  Creatinine 2.69 and patient did desaturate into the 80s percentile on room air and was placed on nasal cannula oxygen.  Bilateral pulmonary edema suggested on chest x-ray with BNP 5125 and troponin 49.  Flu/RSV/corona panel negative.  Review of Systems: Reviewed as noted above, otherwise negative.  Past Medical History:  Diagnosis Date   Acute ST elevation myocardial infarction (STEMI) due to occlusion of mid portion of left anterior descending (LAD) coronary artery (HCC) 03/12/2019   Acute systolic heart failure (HCC)    AICD (automatic cardioverter/defibrillator) present    Arthritis    CHF (congestive heart failure) (HCC)    CKD (chronic kidney disease) stage 4, GFR 15-29 ml/min (HCC)    Diabetes mellitus, type II (HCC)    Dyspnea    with exertionm periodically   Heart attack (HCC) 03/15/2019   anterior MI    History of kidney stones    Hyperparathyroidism    Hypertension 2002   Ischemic cardiomyopathy    Left knee pain    Syncope 04/04/2019   after blood  draw   Vitamin D deficiency     Past Surgical History:  Procedure Laterality Date   AMPUTATION TOE Left 1991   2 digit of left foot   BIOPSY  02/26/2021   Procedure: BIOPSY;  Surgeon: Golda Claudis PENNER, MD;  Location: [facility];  Service: Endoscopy;;   CHOLECYSTECTOMY     COLONOSCOPY     COLONOSCOPY WITH PROPOFOL  N/A 02/26/2021   Procedure: COLONOSCOPY WITH PROPOFOL ;  Surgeon: Golda Claudis PENNER, MD;  Location: [facility];  Service: Endoscopy;  Laterality: N/A;   CORONARY/GRAFT ACUTE MI REVASCULARIZATION N/A 03/12/2019   Procedure: Coronary/Graft Acute MI Revascularization;  Surgeon: Claudene Victory ORN, MD;  Location: [facility];  Service: Cardiovascular;  Laterality: N/A;   ESOPHAGOGASTRODUODENOSCOPY (EGD) WITH PROPOFOL  N/A 02/26/2021   Procedure: ESOPHAGOGASTRODUODENOSCOPY (EGD) WITH PROPOFOL ;  Surgeon: Golda Claudis PENNER, MD;  Location: [facility];  Service: Endoscopy;  Laterality: N/A;   ICD IMPLANT N/A 08/24/2019   Procedure: ICD IMPLANT;  Surgeon: Waddell Danelle ORN, MD;  Location: [facility];  Service: Cardiovascular;  Laterality: N/A;   IR URETERAL STENT LEFT NEW ACCESS W/O SEP NEPHROSTOMY CATH  09/26/2019   LEFT HEART CATH AND CORONARY ANGIOGRAPHY N/A 03/12/2019   Procedure: LEFT HEART CATH AND CORONARY ANGIOGRAPHY;  Surgeon: Claudene Victory ORN, MD;  Location: [facility];  Service: Cardiovascular;  Laterality: N/A;   LITHOTRIPSY Right  NEPHROLITHOTOMY Left 09/26/2019   Procedure: LEFT NEPHROLITHOTOMY PERCUTANEOUS;  Surgeon: Watt Rush, MD;  Location: [facility];  Service: Urology;  Laterality: Left;   TOTAL KNEE ARTHROPLASTY Left 02/02/2022   Procedure: TOTAL KNEE ARTHROPLASTY;  Surgeon: Rubie Kemps, MD;  Location: [facility];  Service: Orthopedics;  Laterality: Left;     reports that he quit smoking about 5 years ago. His smoking use included cigarettes. He started smoking about 45 years ago. He has a 20 pack-year smoking history. He has never used smokeless tobacco.  He reports that he does not drink alcohol and does not use drugs.  Allergies[1]  Family History  Problem Relation Age of Onset   Heart failure Mother    Heart disease Mother    Cancer Mother        breast   Heart attack Father    Hypertension Father    Heart disease Father    Diabetes Brother     Prior to Admission medications  Medication Sig Start Date End Date Taking? Authorizing Provider  allopurinol  (ZYLOPRIM ) 100 MG tablet Take 100 mg by mouth daily.  04/25/19 04/12/25  [provider]  ALPRAZolam  (XANAX ) 0.5 MG tablet Take 1 tablet (0.5 mg total) by mouth 3 (three) times daily as needed for anxiety. 04/17/24   Tobie Suzzane POUR, MD  atorvastatin  (LIPITOR ) 80 MG tablet Take 1 tablet (80 mg total) by mouth daily. 09/08/23   Tobie Suzzane POUR, MD  BRILINTA  60 MG TABS tablet TAKE (1) TABLET BY MOUTH TWICE DAILY. 03/02/23   Bensimhon, Toribio SAUNDERS, MD  carvedilol  (COREG ) 12.5 MG tablet TAKE 1 TABLET BY MOUTH TWICE DAILY WITH MEALS 03/02/23   Bensimhon, Toribio SAUNDERS, MD  cinacalcet  (SENSIPAR ) 30 MG tablet Take 30 mg by mouth daily. 12/28/20   [provider]  furosemide  (LASIX ) 40 MG tablet Take 1 tablet (40 mg total) by mouth daily. 04/12/24   Glena Harlene HERO, FNP  ivabradine  (CORLANOR ) 5 MG TABS tablet TAKE 1 TABLET BY MOUTH TWICE DAILY WITH A MEAL 05/11/23   Waddell Danelle ORN, MD  losartan  (COZAAR ) 25 MG tablet Take 0.5 tablets (12.5 mg total) by mouth daily. Patient not taking: Reported on 04/12/2024 07/23/23 01/21/24  Glena Harlene HERO, FNP  nitroGLYCERIN  (NITROSTAT ) 0.4 MG SL tablet Place 1 tablet (0.4 mg total) under the tongue every 5 (five) minutes as needed. 07/23/23   [place], Harlene HERO, FNP  Oxycodone  HCl 10 MG TABS Take 1 tablet (10 mg total) by mouth 2 (two) times daily as needed (Severe pain). 05/02/24   Tobie Suzzane POUR, MD  pantoprazole  (PROTONIX ) 40 MG tablet Take 1 tablet (40 mg total) by mouth daily before breakfast. 03/24/23   Ezzard Sonny RAMAN, PA-C    Physical  Exam: Vitals:   04/29/24 1400 04/29/24 1415 04/29/24 1430 04/29/24 1445  BP: (!) 131/106 (!) 136/104 (!) 132/99 (!) 134/102  Pulse: (!) 102 95 98 100  Resp: (!) 22 18 13 16   Temp:      TempSrc:      SpO2: 94% 94% 94% 95%  Weight:      Height:        Constitutional: NAD, calm, comfortable Vitals:   04/29/24 1400 04/29/24 1415 04/29/24 1430 04/29/24 1445  BP: (!) 131/106 (!) 136/104 (!) 132/99 (!) 134/102  Pulse: (!) 102 95 98 100  Resp: (!) 22 18 13 16   Temp:      TempSrc:      SpO2: 94% 94% 94% 95%  Weight:  Height:       Eyes: lids and conjunctivae normal Neck: normal, supple Respiratory: clear to auscultation bilaterally. Normal respiratory effort. No accessory muscle use.  Nasal cannula oxygen. Cardiovascular: Regular rate and rhythm, no murmurs. Abdomen: no tenderness, no distention. Bowel sounds positive.  Musculoskeletal:  No edema. Skin: no rashes, lesions, ulcers.  Psychiatric: Flat affect  Labs on Admission: I have personally reviewed following labs and imaging studies  CBC: Recent Labs  Lab 04/29/24 1305  WBC 7.2  HGB 12.5*  HCT 37.7*  MCV 95.0  PLT 196   Basic Metabolic Panel: Recent Labs  Lab 04/29/24 1305  NA 141  K 4.6  CL 110  CO2 15*  GLUCOSE 111*  BUN 34*  CREATININE 2.69*  CALCIUM  10.1   GFR: Estimated Creatinine Clearance: 29.6 mL/min (A) (by C-G formula based on SCr of 2.69 mg/dL (H)). Liver Function Tests: No results for input(s): AST, ALT, ALKPHOS, BILITOT, PROT, ALBUMIN in the last 168 hours. No results for input(s): LIPASE, AMYLASE in the last 168 hours. No results for input(s): AMMONIA in the last 168 hours. Coagulation Profile: No results for input(s): INR, PROTIME in the last 168 hours. Cardiac Enzymes: No results for input(s): CKTOTAL, CKMB, CKMBINDEX, TROPONINI in the last 168 hours. BNP (last 3 results) Recent Labs    03/07/24 2226 04/29/24 1305  PROBNP 22,315.0* 5,125.0*    HbA1C: No results for input(s): HGBA1C in the last 72 hours. CBG: No results for input(s): GLUCAP in the last 168 hours. Lipid Profile: No results for input(s): CHOL, HDL, LDLCALC, TRIG, CHOLHDL, LDLDIRECT in the last 72 hours. Thyroid  Function Tests: No results for input(s): TSH, T4TOTAL, FREET4, T3FREE, THYROIDAB in the last 72 hours. Anemia Panel: No results for input(s): VITAMINB12, FOLATE, FERRITIN, TIBC, IRON , RETICCTPCT in the last 72 hours. Urine analysis:    Component Value Date/Time   COLORURINE YELLOW 02/22/2021 1225   APPEARANCEUR Clear 07/10/2021 1609   LABSPEC 1.012 02/22/2021 1225   PHURINE 5.0 02/22/2021 1225   GLUCOSEU Negative 07/10/2021 1609   HGBUR NEGATIVE 02/22/2021 1225   BILIRUBINUR Negative 07/10/2021 1609   KETONESUR NEGATIVE 02/22/2021 1225   PROTEINUR Negative 07/10/2021 1609   PROTEINUR NEGATIVE 02/22/2021 1225   UROBILINOGEN 0.2 12/27/2019 0937   UROBILINOGEN 0.2 04/08/2011 1232   NITRITE Negative 07/10/2021 1609   NITRITE NEGATIVE 02/22/2021 1225   LEUKOCYTESUR Trace (A) 07/10/2021 1609   LEUKOCYTESUR NEGATIVE 02/22/2021 1225    Radiological Exams on Admission: DG Chest 2 View Result Date: 04/29/2024 CLINICAL DATA:  Chest pain. Nausea, vomiting, diarrhea, runny nose. Fall on Christmas Eve. EXAM: CHEST - 2 VIEW COMPARISON:  03/09/2024 FINDINGS: Unchanged mild cardiomegaly. Interval development of pulmonary vascular congestion. LEFT chest wall AICD again seen. Interval development of BILATERAL central airspace opacities most likely due to pulmonary edema. IMPRESSION: Interval development of extensive BILATERAL central airspace opacities, most likely due to pulmonary edema. Electronically Signed   By: Aliene Lloyd M.D.   On: 04/29/2024 14:12    EKG: Independently reviewed.  ST 103 bpm.  Assessment/Plan Principal Problem:   Acute hypoxemic respiratory failure (HCC) Active Problems:   Essential  hypertension   Chronic kidney disease, stage IV (severe) (HCC)   Hyperlipidemia   Ischemic cardiomyopathy   CAD in native artery   Type 2 diabetes with nephropathy (HCC)   GAD (generalized anxiety disorder)   GERD (gastroesophageal reflux disease)    Acute hypoxemic respiratory failure secondary to acute HFrEF - LVEF 20-25% from 03/2024 - Remain on IV Lasix   40 mg twice daily - Strict I's and O's and daily weights - Weaned to room air as tolerated  Mild troponin elevation - Likely in the setting of above - Plan to repeat limited echocardiogram  CKD stage IV with metabolic acidosis - Follows with nephrologist Dr. Rachele - Appears to be near baseline - Started on sodium HCO3 tablets  CAD/dyslipidemia -DES to LAD 03/2019 - Continue Brilinta  and atorvastatin   Hypertension-elevated - Continue carvedilol  and losartan  with diuresis  GERD -PPI  Gout - Continue allopurinol    DVT prophylaxis: Heparin  Code Status: Full Family Communication: None at bedside Disposition Plan: Admit for diuresis in setting of heart failure Consults called: None Admission status: Observation, telemetry  Severity of Illness: The appropriate patient status for this patient is OBSERVATION. Observation status is judged to be reasonable and necessary in order to provide the required intensity of service to ensure the patient's safety. The patient's presenting symptoms, physical exam findings, and initial radiographic and laboratory data in the context of their medical condition is felt to place them at decreased risk for further clinical deterioration. Furthermore, it is anticipated that the patient will be medically stable for discharge from the hospital within 2 midnights of admission.    Tija Biss D Maree DO [facility]  If 7PM-7AM, please contact night-coverage [email or web address]  04/29/2024, 2:57 PM       [1] No Known Allergies  "

## 2024-04-29 NOTE — ED Notes (Signed)
 Patient transported to X-ray

## 2024-04-29 NOTE — ED Triage Notes (Signed)
 Pov c/o CP that started last night. Pain is left side and stationary. Endorses n/v/d, sob, runny nose. Denies fever. Pt states they fell on christmas eve onto their chest and c/o left knee pain. Pt does have defibrillator.

## 2024-04-30 ENCOUNTER — Other Ambulatory Visit: Payer: Self-pay | Admitting: Gastroenterology

## 2024-04-30 DIAGNOSIS — J9601 Acute respiratory failure with hypoxia: Secondary | ICD-10-CM | POA: Diagnosis not present

## 2024-04-30 LAB — BASIC METABOLIC PANEL WITH GFR
Anion gap: 10 (ref 5–15)
BUN: 34 mg/dL — ABNORMAL HIGH (ref 8–23)
CO2: 23 mmol/L (ref 22–32)
Calcium: 9.5 mg/dL (ref 8.9–10.3)
Chloride: 109 mmol/L (ref 98–111)
Creatinine, Ser: 2.92 mg/dL — ABNORMAL HIGH (ref 0.61–1.24)
GFR, Estimated: 23 mL/min — ABNORMAL LOW
Glucose, Bld: 108 mg/dL — ABNORMAL HIGH (ref 70–99)
Potassium: 3.9 mmol/L (ref 3.5–5.1)
Sodium: 142 mmol/L (ref 135–145)

## 2024-04-30 LAB — CBC
HCT: 33.2 % — ABNORMAL LOW (ref 39.0–52.0)
Hemoglobin: 11.2 g/dL — ABNORMAL LOW (ref 13.0–17.0)
MCH: 31.9 pg (ref 26.0–34.0)
MCHC: 33.7 g/dL (ref 30.0–36.0)
MCV: 94.6 fL (ref 80.0–100.0)
Platelets: 166 K/uL (ref 150–400)
RBC: 3.51 MIL/uL — ABNORMAL LOW (ref 4.22–5.81)
RDW: 14.6 % (ref 11.5–15.5)
WBC: 6.2 K/uL (ref 4.0–10.5)
nRBC: 0 % (ref 0.0–0.2)

## 2024-04-30 LAB — MAGNESIUM: Magnesium: 2.1 mg/dL (ref 1.7–2.4)

## 2024-04-30 NOTE — Plan of Care (Signed)

## 2024-04-30 NOTE — Progress Notes (Signed)
 Inpatient Care Manager [facility]) has reviewed patient and no ICM needs have been identified at this time. However, IPCM team will continue to follow along and monitor patient advancement through interdisciplinary progression rounds. If new patient transition needs arise, please enter a ICM consult to prompt IPCM team to follow up.    04/30/24 1448  TOC Brief Assessment  Insurance and Status Reviewed  Patient has primary care physician Yes  Home environment has been reviewed Home alone  Prior level of function: Independent  Prior/Current Home Services No current home services  Social Drivers of Health Review SDOH reviewed no interventions necessary  Readmission risk has been reviewed Yes  Transition of care needs no transition of care needs at this time

## 2024-04-30 NOTE — Progress Notes (Signed)
 " PROGRESS NOTE    [name]  FMW:[id number] DOB: 1955-05-15 DOA: 04/29/2024 PCP: Tobie Suzzane POUR, MD   Brief Narrative:    [name] is a 68 y.o. male with medical history significant for HFrEF with EF 20-25%, CAD, CKD stage IV, and GERD who presented to the ED with complaints of chest pain and shortness of breath.  Patient was admitted with acute hypoxemic respiratory failure secondary to acute HFrEF and has been started on IV Lasix  40 mg twice daily.  His urine output has not been well recorded and daily weights appear inaccurate, but scale weight this morning 179 pounds.  Cardiology to consult in a.m.  Assessment & Plan:   Principal Problem:   Acute hypoxemic respiratory failure (HCC) Active Problems:   Essential hypertension   Chronic kidney disease, stage IV (severe) (HCC)   Hyperlipidemia   Ischemic cardiomyopathy   CAD in native artery   Type 2 diabetes with nephropathy (HCC)   GAD (generalized anxiety disorder)   GERD (gastroesophageal reflux disease)  Assessment and Plan:   Acute hypoxemic respiratory failure secondary to acute HFrEF - LVEF 20-25% from 03/2024 - Remain on IV Lasix  40 mg twice daily, urine outputs and daily weights inaccurate, bed scale weight this morning 179 pounds - Strict I's and O's and daily weights - Weaned to room air as tolerated -Appreciate cardiology evaluation in a.m.   Mild troponin elevation - Likely in the setting of above - Limited echocardiogram stable from prior   CKD stage IV with metabolic acidosis-improved - Follows with nephrologist Dr. Rachele - Appears to be near baseline - Started on sodium HCO3 tablets   CAD/dyslipidemia -DES to LAD 03/2019 - Continue Brilinta  and atorvastatin    Hypertension-elevated - Continue carvedilol  and losartan  with diuresis   GERD -PPI   Gout - Continue allopurinol     DVT prophylaxis: Heparin  Code Status: Full Family Communication: None at bedside Disposition Plan:  Continue diuresis Status is: Observation The patient will require care spanning > 2 midnights and should be moved to inpatient because: Need for IV Lasix .   Consultants:  Cardiology in a.m.  Procedures:  None  Antimicrobials:  None   Subjective: Patient seen and evaluated today with no new acute complaints or concerns. No acute concerns or events noted overnight.  He states that his shortness of breath is getting better and he has been having frequent urine output although it appears that this is not being adequately measured.  Additionally, his weights have been in the bed.  Objective: Vitals:   04/30/24 0845 04/30/24 0907 04/30/24 1036 04/30/24 1043  BP: 109/87     Pulse: 96     Resp: 18     Temp: 98.2 F (36.8 C)     TempSrc: Oral     SpO2: 100% 94%  97%  Weight:   81.4 kg   Height:        Intake/Output Summary (Last 24 hours) at 04/30/2024 1222 Last data filed at 04/30/2024 0933 Gross per 24 hour  Intake 600 ml  Output 975 ml  Net -375 ml   Filed Weights   04/29/24 1341 04/30/24 0500 04/30/24 1036  Weight: 86 kg 86 kg 81.4 kg    Examination:  General exam: Appears calm and comfortable  Respiratory system: Clear to auscultation. Respiratory effort normal. Cardiovascular system: S1 & S2 heard, RRR.  Gastrointestinal system: Abdomen is soft Central nervous system: Alert and awake Extremities: No edema Skin: No significant lesions noted Psychiatry: Flat affect.  Data Reviewed: I have personally reviewed following labs and imaging studies  CBC: Recent Labs  Lab 04/29/24 1305 04/30/24 0508  WBC 7.2 6.2  HGB 12.5* 11.2*  HCT 37.7* 33.2*  MCV 95.0 94.6  PLT 196 166   Basic Metabolic Panel: Recent Labs  Lab 04/29/24 1305 04/30/24 0508  NA 141 142  K 4.6 3.9  CL 110 109  CO2 15* 23  GLUCOSE 111* 108*  BUN 34* 34*  CREATININE 2.69* 2.92*  CALCIUM  10.1 9.5  MG  --  2.1   GFR: Estimated Creatinine Clearance: 27.3 mL/min (A) (by C-G  formula based on SCr of 2.92 mg/dL (H)). Liver Function Tests: No results for input(s): AST, ALT, ALKPHOS, BILITOT, PROT, ALBUMIN in the last 168 hours. No results for input(s): LIPASE, AMYLASE in the last 168 hours. No results for input(s): AMMONIA in the last 168 hours. Coagulation Profile: No results for input(s): INR, PROTIME in the last 168 hours. Cardiac Enzymes: No results for input(s): CKTOTAL, CKMB, CKMBINDEX, TROPONINI in the last 168 hours. BNP (last 3 results) Recent Labs    03/07/24 2226 04/29/24 1305  PROBNP 22,315.0* 5,125.0*   HbA1C: No results for input(s): HGBA1C in the last 72 hours. CBG: No results for input(s): GLUCAP in the last 168 hours. Lipid Profile: No results for input(s): CHOL, HDL, LDLCALC, TRIG, CHOLHDL, LDLDIRECT in the last 72 hours. Thyroid  Function Tests: No results for input(s): TSH, T4TOTAL, FREET4, T3FREE, THYROIDAB in the last 72 hours. Anemia Panel: No results for input(s): VITAMINB12, FOLATE, FERRITIN, TIBC, IRON , RETICCTPCT in the last 72 hours. Sepsis Labs: No results for input(s): PROCALCITON, LATICACIDVEN in the last 168 hours.  Recent Results (from the past 240 hours)  Resp panel by RT-PCR (RSV, Flu A&B, Covid) Anterior Nasal Swab     Status: None   Collection Time: 04/29/24  1:28 PM   Specimen: Anterior Nasal Swab  Result Value Ref Range Status   SARS Coronavirus 2 by RT PCR NEGATIVE NEGATIVE Final    Comment: (NOTE) SARS-CoV-2 target nucleic acids are NOT DETECTED.  The SARS-CoV-2 RNA is generally detectable in upper respiratory specimens during the acute phase of infection. The lowest concentration of SARS-CoV-2 viral copies this assay can detect is 138 copies/mL. A negative result does not preclude SARS-Cov-2 infection and should not be used as the sole basis for treatment or other patient management decisions. A negative result may occur with  improper  specimen collection/handling, submission of specimen other than nasopharyngeal swab, presence of viral mutation(s) within the areas targeted by this assay, and inadequate number of viral copies(<138 copies/mL). A negative result must be combined with clinical observations, patient history, and epidemiological information. The expected result is Negative.  Fact Sheet for Patients:  bloggercourse.com  Fact Sheet for Healthcare Providers:  seriousbroker.it  This test is no t yet approved or cleared by the [place]  FDA and  has been authorized for detection and/or diagnosis of SARS-CoV-2 by FDA under an Emergency Use Authorization (EUA). This EUA will remain  in effect (meaning this test can be used) for the duration of the COVID-19 declaration under Section 564(b)(1) of the Act, 21 U.S.C.section 360bbb-3(b)(1), unless the authorization is terminated  or revoked sooner.       Influenza A by PCR NEGATIVE NEGATIVE Final   Influenza B by PCR NEGATIVE NEGATIVE Final    Comment: (NOTE) The Xpert Xpress SARS-CoV-2/FLU/RSV plus assay is intended as an aid in the diagnosis of influenza from Nasopharyngeal swab specimens and should not  be used as a sole basis for treatment. Nasal washings and aspirates are unacceptable for Xpert Xpress SARS-CoV-2/FLU/RSV testing.  Fact Sheet for Patients: bloggercourse.com  Fact Sheet for Healthcare Providers: seriousbroker.it  This test is not yet approved or cleared by the [place]  FDA and has been authorized for detection and/or diagnosis of SARS-CoV-2 by FDA under an Emergency Use Authorization (EUA). This EUA will remain in effect (meaning this test can be used) for the duration of the COVID-19 declaration under Section 564(b)(1) of the Act, 21 U.S.C. section 360bbb-3(b)(1), unless the authorization is terminated or revoked.     Resp  Syncytial Virus by PCR NEGATIVE NEGATIVE Final    Comment: (NOTE) Fact Sheet for Patients: bloggercourse.com  Fact Sheet for Healthcare Providers: seriousbroker.it  This test is not yet approved or cleared by the [place]  FDA and has been authorized for detection and/or diagnosis of SARS-CoV-2 by FDA under an Emergency Use Authorization (EUA). This EUA will remain in effect (meaning this test can be used) for the duration of the COVID-19 declaration under Section 564(b)(1) of the Act, 21 U.S.C. section 360bbb-3(b)(1), unless the authorization is terminated or revoked.  Performed at [facility], [address], [place], [place] [address]          Radiology Studies: ECHOCARDIOGRAM LIMITED Result Date: 04/29/2024    ECHOCARDIOGRAM LIMITED REPORT   Patient Name:   [name] Date of Exam: 04/29/2024 Medical Rec #:  [id number]        Height:       72.0 in Accession #:    [id number]       Weight:       189.6 lb Date of Birth:  Jul 30, 1955        BSA:          2.083 m Patient Age:    68 years         BP:           124/100 mmHg Patient Gender: M                HR:           102 bpm. Exam Location:  [facility] Procedure: Limited Echo, Limited Color Doppler, Cardiac Doppler and Intracardiac            Opacification Agent (Both Spectral and Color Flow Doppler were            utilized during procedure). Indications:    Elevated Troponin  History:        Patient has prior history of Echocardiogram examinations, most                 recent 03/08/2024. CHF and Cardiomyopathy, CAD, Defibrillator,                 Signs/Symptoms:Shortness of Breath and Dyspnea; Risk                 Factors:Hypertension, Diabetes, Dyslipidemia, Former Smoker and                 elevated troponin.  Sonographer:    Koleen Popper RDCS Referring Phys: [id number] Adaira Centola D [facility] IMPRESSIONS  1. Left ventricular ejection fraction, by estimation, is 20 to 25%. The left ventricle  has severely decreased function. The left ventricular internal cavity size was moderately to severely dilated.  2. Moderate to severe mitral valve regurgitation.  3. Tricuspid valve regurgitation is mild.  4. The aortic valve is tricuspid. Aortic valve regurgitation is not visualized.  No aortic stenosis is present.  5. There is normal pulmonary artery systolic pressure.  6. The inferior vena cava is dilated in size with <50% respiratory variability, suggesting right atrial pressure of 15 mmHg. Comparison(s): Prior images reviewed side by side. Increase in mitral regurgitation in the setting of increased IVC plethora. FINDINGS  Left Ventricle: Left ventricular ejection fraction, by estimation, is 20 to 25%. The left ventricle has severely decreased function. Definity  contrast agent was given IV to delineate the left ventricular endocardial borders. The left ventricular internal cavity size was moderately to severely dilated.  LV Wall Scoring: The mid and distal inferior wall, basal anteroseptal segment, basal anterior segment, and basal inferoseptal segment are akinetic. The mid and distal anterior wall, antero-lateral wall, mid and distal lateral wall, mid and distal anterior septum, mid inferoseptal segment, and basal inferior segment are hypokinetic. The basal inferolateral segment is normal. Right Ventricle: There is normal pulmonary artery systolic pressure. The tricuspid regurgitant velocity is 2.19 m/s, and with an assumed right atrial pressure of 15 mmHg, the estimated right ventricular systolic pressure is 34.2 mmHg. Mitral Valve: Moderate to severe mitral valve regurgitation. Tricuspid Valve: Eccentricl tricuspid regurgitation not well imaged in this study. The tricuspid valve is normal in structure. Tricuspid valve regurgitation is mild . No evidence of tricuspid stenosis. Aortic Valve: The aortic valve is tricuspid. Aortic valve regurgitation is not visualized. No aortic stenosis is present. Pulmonic  Valve: The pulmonic valve was normal in structure. Pulmonic valve regurgitation is not visualized. No evidence of pulmonic stenosis. Venous: The inferior vena cava is dilated in size with less than 50% respiratory variability, suggesting right atrial pressure of 15 mmHg. Additional Comments: Spectral Doppler performed. Color Doppler performed.  LEFT VENTRICLE PLAX 2D LVIDd:         6.50 cm LVIDs:         6.05 cm LV PW:         1.00 cm LV IVS:        0.90 cm LVOT diam:     1.70 cm LV SV:         30 LV SV Index:   15 LVOT Area:     2.27 cm  LV Volumes (MOD) LV vol d, MOD A4C: 350.0 ml LV vol s, MOD A4C: 274.0 ml LV SV MOD A4C:     350.0 ml IVC IVC diam: 2.20 cm LEFT ATRIUM            Index LA diam:      4.90 cm  2.35 cm/m LA Vol (A4C): 102.0 ml 48.97 ml/m  AORTIC VALVE LVOT Vmax:   92.70 cm/s LVOT Vmean:  56.300 cm/s LVOT VTI:    0.134 m  AORTA Ao Root diam: 2.90 cm MITRAL VALVE                  TRICUSPID VALVE MV Area (PHT): 7.66 cm       TR Peak grad:   19.2 mmHg MV Decel Time: 99 msec        TR Vmax:        219.00 cm/s MR Peak grad:    105.7 mmHg MR Mean grad:    70.0 mmHg    SHUNTS MR Vmax:         514.00 cm/s  Systemic VTI:  0.13 m MR Vmean:        399.0 cm/s   Systemic Diam: 1.70 cm MR PISA:         3.08 cm  MR PISA Eff ROA: 15 mm MR PISA Radius:  0.70 cm MV E velocity: 97.20 cm/s MV A velocity: 64.80 cm/s MV E/A ratio:  1.50 Stanly Leavens MD Electronically signed by Stanly Leavens MD Signature Date/Time: 04/29/2024/5:30:00 PM    Final    DG Chest 2 View Result Date: 04/29/2024 CLINICAL DATA:  Chest pain. Nausea, vomiting, diarrhea, runny nose. Fall on Christmas Eve. EXAM: CHEST - 2 VIEW COMPARISON:  03/09/2024 FINDINGS: Unchanged mild cardiomegaly. Interval development of pulmonary vascular congestion. LEFT chest wall AICD again seen. Interval development of BILATERAL central airspace opacities most likely due to pulmonary edema. IMPRESSION: Interval development of extensive BILATERAL  central airspace opacities, most likely due to pulmonary edema. Electronically Signed   By: Aliene Lloyd M.D.   On: 04/29/2024 14:12        Scheduled Meds:  allopurinol   100 mg Oral Daily   atorvastatin   80 mg Oral Daily   carvedilol   12.5 mg Oral BID WC   cinacalcet   30 mg Oral Daily   furosemide   40 mg Intravenous BID   heparin   5,000 Units Subcutaneous Q8H   ivabradine   5 mg Oral BID WC   pantoprazole   40 mg Oral QAC breakfast   sodium bicarbonate   650 mg Oral BID   sodium chloride  flush  3 mL Intravenous Q12H   ticagrelor   60 mg Oral BID   Continuous Infusions:  sodium chloride        LOS: 0 days    Time spent: 55 minutes    Rise Traeger JONETTA Fairly, DO [facility]  If 7PM-7AM, please contact night-coverage [email or web address] 04/30/2024, 12:22 PM   "

## 2024-04-30 NOTE — Care Management Obs Status (Signed)
 MEDICARE OBSERVATION STATUS NOTIFICATION   Patient Details  Name: [name] MRN: [id number] Date of Birth: 01/17/1956   Medicare Observation Status Notification Given:  Yes    Ronnald MARLA Sil, RN 04/30/2024, 2:44 PM

## 2024-05-01 DIAGNOSIS — J9601 Acute respiratory failure with hypoxia: Secondary | ICD-10-CM | POA: Diagnosis not present

## 2024-05-01 LAB — BASIC METABOLIC PANEL WITH GFR
Anion gap: 12 (ref 5–15)
BUN: 35 mg/dL — ABNORMAL HIGH (ref 8–23)
CO2: 22 mmol/L (ref 22–32)
Calcium: 9.5 mg/dL (ref 8.9–10.3)
Chloride: 106 mmol/L (ref 98–111)
Creatinine, Ser: 2.9 mg/dL — ABNORMAL HIGH (ref 0.61–1.24)
GFR, Estimated: 23 mL/min — ABNORMAL LOW
Glucose, Bld: 98 mg/dL (ref 70–99)
Potassium: 3.5 mmol/L (ref 3.5–5.1)
Sodium: 140 mmol/L (ref 135–145)

## 2024-05-01 LAB — CBC
HCT: 33.6 % — ABNORMAL LOW (ref 39.0–52.0)
Hemoglobin: 11.3 g/dL — ABNORMAL LOW (ref 13.0–17.0)
MCH: 31.5 pg (ref 26.0–34.0)
MCHC: 33.6 g/dL (ref 30.0–36.0)
MCV: 93.6 fL (ref 80.0–100.0)
Platelets: 176 K/uL (ref 150–400)
RBC: 3.59 MIL/uL — ABNORMAL LOW (ref 4.22–5.81)
RDW: 14.3 % (ref 11.5–15.5)
WBC: 5.2 K/uL (ref 4.0–10.5)
nRBC: 0 % (ref 0.0–0.2)

## 2024-05-01 LAB — MAGNESIUM: Magnesium: 2 mg/dL (ref 1.7–2.4)

## 2024-05-01 NOTE — Progress Notes (Signed)
 Discharge paper work has been given and explained to patient. Iv was removed earlier today due possible infiltration. Tele was removed by myself and placed back in correct spot. Patient will be calling ride to come get him from main entrance.

## 2024-05-01 NOTE — Discharge Summary (Signed)
 " Physician Discharge Summary   Patient: [name] MRN: [id number] DOB: [birth year]  Admit date:     04/29/2024  Discharge date: 05/01/2024  Discharge Physician: Lonni SHAUNNA Dalton   PCP: Tobie Suzzane POUR, MD     Recommendations at discharge:  Follow up with Cardiology in 1-2 weeks, referral sent     Discharge Diagnoses: Principal Problem:   Acute hypoxemic respiratory failure (HCC) Active Problems:   Essential hypertension   Chronic kidney disease, stage IV (severe) (HCC)   Hyperlipidemia   Ischemic cardiomyopathy   CAD in native artery   Type 2 diabetes with nephropathy (HCC)   GAD (generalized anxiety disorder)   GERD (gastroesophageal reflux disease)      Hospital Course: 68 y.o. M with sCHF EF 20-25%, CKD IV baseline creatinine 2.8-3.8, follows with Dr. Rachele, CAD last PCI 2020, HTN, and smoking who presented with CHF flare and hypoxia.  Started on IV Lasix  and admitted.        Acute hypoxemic respiratory failure secondary to acute HFrEF LVEF 20-25% from 03/2024.  Diuresed well, on day of discharge, was ambulating without dyspnea, leg swelling resolved, felt at baseline, creatinine stable relative to baseline.    Myocardial injury due to CHF flare Limited echocardiogram stable from prior, ischemia ruled out   CKD stage IV with acute metabolic acidosis Follows with nephrologist Dr. Rachele.  Diuresed and remained stable.  Follow up with PCP    CAD Hyperlipidemia Stent placed >5 yrs.  Stable on Lipitor  and Brilinta     Hypertension Stable on carvedilol  and losartan           The [place]  Controlled Substances Registry was reviewed for this patient prior to discharge.  Procedures performed:  Echo Disposition: Home Diet recommendation:  Cardiac diet  DISCHARGE MEDICATION: Allergies as of 05/01/2024   No Known Allergies      Medication List     TAKE these medications    allopurinol  100 MG tablet Commonly known as:  ZYLOPRIM  Take 100 mg by mouth daily.   ALPRAZolam  0.5 MG tablet Commonly known as: XANAX  Take 1 tablet (0.5 mg total) by mouth 3 (three) times daily as needed for anxiety.   atorvastatin  80 MG tablet Commonly known as: LIPITOR  Take 1 tablet (80 mg total) by mouth daily.   Brilinta  60 MG Tabs tablet Generic drug: ticagrelor  TAKE (1) TABLET BY MOUTH TWICE DAILY.   carvedilol  12.5 MG tablet Commonly known as: COREG  TAKE 1 TABLET BY MOUTH TWICE DAILY WITH MEALS   cinacalcet  30 MG tablet Commonly known as: SENSIPAR  Take 30 mg by mouth daily.   furosemide  40 MG tablet Commonly known as: LASIX  Take 1 tablet (40 mg total) by mouth daily.   ivabradine  5 MG Tabs tablet Commonly known as: CORLANOR  TAKE 1 TABLET BY MOUTH TWICE DAILY WITH A MEAL   nitroGLYCERIN  0.4 MG SL tablet Commonly known as: Nitrostat  Place 1 tablet (0.4 mg total) under the tongue every 5 (five) minutes as needed.   Oxycodone  HCl 10 MG Tabs Take 1 tablet (10 mg total) by mouth 2 (two) times daily as needed (Severe pain). Start taking on: May 02, 2024   pantoprazole  40 MG tablet Commonly known as: PROTONIX  Take 1 tablet (40 mg total) by mouth daily before breakfast.        Follow-up Information     [facility]. Go on 05/19/2024.   Specialty: Cardiology Why: Hospital Follow-Up 05/19/24 @ 9:00 AM Please bring all medications to  follow-up appointment [facility], Entrance C off of [address] [place] Parking at the door or use GATE CODE [id number] to park under the building. Contact information: [address] [place]  [phone]                  Discharge Exam: [name]   04/30/24 0500 04/30/24 1036 05/01/24 0500  Weight: 86 kg 81.4 kg 81.3 kg    General: Pt is alert, awake, not in acute distress Cardiovascular: RRR, nl S1-S2, no murmurs appreciated.   No LE edema.   Respiratory: Normal  respiratory rate and rhythm.  CTAB without rales or wheezes. Abdominal: Abdomen soft and non-tender.  No distension or HSM.   Neuro/Psych: Strength symmetric in upper and lower extremities.  Judgment and insight appear normal.   Condition at discharge: good  The results of significant diagnostics from this hospitalization (including imaging, microbiology, ancillary and laboratory) are listed below for reference.   Imaging Studies: ECHOCARDIOGRAM LIMITED Result Date: 04/29/2024    ECHOCARDIOGRAM LIMITED REPORT   Patient Name:   [name] Date of Exam: 04/29/2024 Medical Rec #:  [id number]        Height:       72.0 in Accession #:    [id number]       Weight:       189.6 lb Date of Birth:  07-Aug-1955        BSA:          2.083 m Patient Age:    68 years         BP:           124/100 mmHg Patient Gender: M                HR:           102 bpm. Exam Location:  [facility] Procedure: Limited Echo, Limited Color Doppler, Cardiac Doppler and Intracardiac            Opacification Agent (Both Spectral and Color Flow Doppler were            utilized during procedure). Indications:    Elevated Troponin  History:        Patient has prior history of Echocardiogram examinations, most                 recent 03/08/2024. CHF and Cardiomyopathy, CAD, Defibrillator,                 Signs/Symptoms:Shortness of Breath and Dyspnea; Risk                 Factors:Hypertension, Diabetes, Dyslipidemia, Former Smoker and                 elevated troponin.  Sonographer:    Koleen Popper RDCS Referring Phys: [id number] PRATIK D [facility] IMPRESSIONS  1. Left ventricular ejection fraction, by estimation, is 20 to 25%. The left ventricle has severely decreased function. The left ventricular internal cavity size was moderately to severely dilated.  2. Moderate to severe mitral valve regurgitation.  3. Tricuspid valve regurgitation is mild.  4. The aortic valve is tricuspid. Aortic valve regurgitation is not visualized. No aortic stenosis is  present.  5. There is normal pulmonary artery systolic pressure.  6. The inferior vena cava is dilated in size with <50% respiratory variability, suggesting right atrial pressure of 15 mmHg. Comparison(s): Prior images reviewed side by side. Increase in mitral regurgitation in the setting of increased  IVC plethora. FINDINGS  Left Ventricle: Left ventricular ejection fraction, by estimation, is 20 to 25%. The left ventricle has severely decreased function. Definity  contrast agent was given IV to delineate the left ventricular endocardial borders. The left ventricular internal cavity size was moderately to severely dilated.  LV Wall Scoring: The mid and distal inferior wall, basal anteroseptal segment, basal anterior segment, and basal inferoseptal segment are akinetic. The mid and distal anterior wall, antero-lateral wall, mid and distal lateral wall, mid and distal anterior septum, mid inferoseptal segment, and basal inferior segment are hypokinetic. The basal inferolateral segment is normal. Right Ventricle: There is normal pulmonary artery systolic pressure. The tricuspid regurgitant velocity is 2.19 m/s, and with an assumed right atrial pressure of 15 mmHg, the estimated right ventricular systolic pressure is 34.2 mmHg. Mitral Valve: Moderate to severe mitral valve regurgitation. Tricuspid Valve: Eccentricl tricuspid regurgitation not well imaged in this study. The tricuspid valve is normal in structure. Tricuspid valve regurgitation is mild . No evidence of tricuspid stenosis. Aortic Valve: The aortic valve is tricuspid. Aortic valve regurgitation is not visualized. No aortic stenosis is present. Pulmonic Valve: The pulmonic valve was normal in structure. Pulmonic valve regurgitation is not visualized. No evidence of pulmonic stenosis. Venous: The inferior vena cava is dilated in size with less than 50% respiratory variability, suggesting right atrial pressure of 15 mmHg. Additional Comments: Spectral Doppler  performed. Color Doppler performed.  LEFT VENTRICLE PLAX 2D LVIDd:         6.50 cm LVIDs:         6.05 cm LV PW:         1.00 cm LV IVS:        0.90 cm LVOT diam:     1.70 cm LV SV:         30 LV SV Index:   15 LVOT Area:     2.27 cm  LV Volumes (MOD) LV vol d, MOD A4C: 350.0 ml LV vol s, MOD A4C: 274.0 ml LV SV MOD A4C:     350.0 ml IVC IVC diam: 2.20 cm LEFT ATRIUM            Index LA diam:      4.90 cm  2.35 cm/m LA Vol (A4C): 102.0 ml 48.97 ml/m  AORTIC VALVE LVOT Vmax:   92.70 cm/s LVOT Vmean:  56.300 cm/s LVOT VTI:    0.134 m  AORTA Ao Root diam: 2.90 cm MITRAL VALVE                  TRICUSPID VALVE MV Area (PHT): 7.66 cm       TR Peak grad:   19.2 mmHg MV Decel Time: 99 msec        TR Vmax:        219.00 cm/s MR Peak grad:    105.7 mmHg MR Mean grad:    70.0 mmHg    SHUNTS MR Vmax:         514.00 cm/s  Systemic VTI:  0.13 m MR Vmean:        399.0 cm/s   Systemic Diam: 1.70 cm MR PISA:         3.08 cm MR PISA Eff ROA: 15 mm MR PISA Radius:  0.70 cm MV E velocity: 97.20 cm/s MV A velocity: 64.80 cm/s MV E/A ratio:  1.50 Stanly Leavens MD Electronically signed by Stanly Leavens MD Signature Date/Time: 04/29/2024/5:30:00 PM    Final    DG Chest 2 View  Result Date: 04/29/2024 CLINICAL DATA:  Chest pain. Nausea, vomiting, diarrhea, runny nose. Fall on Christmas Eve. EXAM: CHEST - 2 VIEW COMPARISON:  03/09/2024 FINDINGS: Unchanged mild cardiomegaly. Interval development of pulmonary vascular congestion. LEFT chest wall AICD again seen. Interval development of BILATERAL central airspace opacities most likely due to pulmonary edema. IMPRESSION: Interval development of extensive BILATERAL central airspace opacities, most likely due to pulmonary edema. Electronically Signed   By: Aliene Lloyd M.D.   On: 04/29/2024 14:12    Microbiology: Results for orders placed or performed during the hospital encounter of 04/29/24  Resp panel by RT-PCR (RSV, Flu A&B, Covid) Anterior Nasal Swab     Status:  None   Collection Time: 04/29/24  1:28 PM   Specimen: Anterior Nasal Swab  Result Value Ref Range Status   SARS Coronavirus 2 by RT PCR NEGATIVE NEGATIVE Final    Comment: (NOTE) SARS-CoV-2 target nucleic acids are NOT DETECTED.  The SARS-CoV-2 RNA is generally detectable in upper respiratory specimens during the acute phase of infection. The lowest concentration of SARS-CoV-2 viral copies this assay can detect is 138 copies/mL. A negative result does not preclude SARS-Cov-2 infection and should not be used as the sole basis for treatment or other patient management decisions. A negative result may occur with  improper specimen collection/handling, submission of specimen other than nasopharyngeal swab, presence of viral mutation(s) within the areas targeted by this assay, and inadequate number of viral copies(<138 copies/mL). A negative result must be combined with clinical observations, patient history, and epidemiological information. The expected result is Negative.  Fact Sheet for Patients:  bloggercourse.com  Fact Sheet for Healthcare Providers:  seriousbroker.it  This test is no t yet approved or cleared by the [place]  FDA and  has been authorized for detection and/or diagnosis of SARS-CoV-2 by FDA under an Emergency Use Authorization (EUA). This EUA will remain  in effect (meaning this test can be used) for the duration of the COVID-19 declaration under Section 564(b)(1) of the Act, 21 U.S.C.section 360bbb-3(b)(1), unless the authorization is terminated  or revoked sooner.       Influenza A by PCR NEGATIVE NEGATIVE Final   Influenza B by PCR NEGATIVE NEGATIVE Final    Comment: (NOTE) The Xpert Xpress SARS-CoV-2/FLU/RSV plus assay is intended as an aid in the diagnosis of influenza from Nasopharyngeal swab specimens and should not be used as a sole basis for treatment. Nasal washings and aspirates are unacceptable  for Xpert Xpress SARS-CoV-2/FLU/RSV testing.  Fact Sheet for Patients: bloggercourse.com  Fact Sheet for Healthcare Providers: seriousbroker.it  This test is not yet approved or cleared by the [place]  FDA and has been authorized for detection and/or diagnosis of SARS-CoV-2 by FDA under an Emergency Use Authorization (EUA). This EUA will remain in effect (meaning this test can be used) for the duration of the COVID-19 declaration under Section 564(b)(1) of the Act, 21 U.S.C. section 360bbb-3(b)(1), unless the authorization is terminated or revoked.     Resp Syncytial Virus by PCR NEGATIVE NEGATIVE Final    Comment: (NOTE) Fact Sheet for Patients: bloggercourse.com  Fact Sheet for Healthcare Providers: seriousbroker.it  This test is not yet approved or cleared by the [place]  FDA and has been authorized for detection and/or diagnosis of SARS-CoV-2 by FDA under an Emergency Use Authorization (EUA). This EUA will remain in effect (meaning this test can be used) for the duration of the COVID-19 declaration under Section 564(b)(1) of the Act, 21 U.S.C. section 360bbb-3(b)(1), unless the  authorization is terminated or revoked.  Performed at [facility], [address] [place], [place] [address]     Labs: CBC: Recent Labs  Lab 04/29/24 1305 04/30/24 0508 05/01/24 0521  WBC 7.2 6.2 5.2  HGB 12.5* 11.2* 11.3*  HCT 37.7* 33.2* 33.6*  MCV 95.0 94.6 93.6  PLT 196 166 176   Basic Metabolic Panel: Recent Labs  Lab 04/29/24 1305 04/30/24 0508 05/01/24 0521  NA 141 142 140  K 4.6 3.9 3.5  CL 110 109 106  CO2 15* 23 22  GLUCOSE 111* 108* 98  BUN 34* 34* 35*  CREATININE 2.69* 2.92* 2.90*  CALCIUM  10.1 9.5 9.5  MG  --  2.1 2.0   Liver Function Tests: No results for input(s): AST, ALT, ALKPHOS, BILITOT, PROT, ALBUMIN in the last 168 hours. CBG: No  results for input(s): GLUCAP in the last 168 hours.  Discharge time spent: approximately 45 minutes spent on discharge counseling, evaluation of patient on day of discharge, and coordination of discharge planning with nursing, social work, pharmacy and case management  Signed: Lonni SHAUNNA Dalton, MD [facility] 05/01/2024          "

## 2024-05-01 NOTE — TOC Transition Note (Addendum)
 Transition of Care [facility]) - Discharge Note   Patient Details  Name: [name] MRN: [id number] Date of Birth: [birth year]  Transition of Care [facility] CM/SW Contact:  Ronnald MARLA Sil, RN Phone Number: 05/01/2024, 12:34 PM   Clinical Narrative:    Patient admitted with CHF Exac, with anticipated discharge home today.  CM met with patient at bedside to provided bathroom scale and pill box, patient confirmed compliance with heart healthy diet and intention to use scale to monitor daily weights.    No additional needs identified at this time, however CM team will continue to follow along and assist as appropriate with finalizing safe discharge plan.  Final next level of care: Home/Self Care Barriers to Discharge: No Barriers Identified   Patient Goals and CMS Choice Patient states their goals for this hospitalization and ongoing recovery are:: Return Home  Discharge Placement  Discharge Plan and Services Additional resources added to the After Visit Summary for    Social Drivers of Health (SDOH) Interventions SDOH Screenings   Food Insecurity: No Food Insecurity (04/29/2024)  Housing: Low Risk (04/29/2024)  Transportation Needs: No Transportation Needs (04/29/2024)  Utilities: Not At Risk (04/29/2024)  Alcohol Screen: Low Risk (01/19/2022)  Depression (PHQ2-9): Low Risk (01/21/2024)  Financial Resource Strain: Low Risk (01/19/2022)  Physical Activity: Sufficiently Active (01/19/2022)  Social Connections: Moderately Integrated (04/29/2024)  Stress: No Stress Concern Present (01/19/2022)  Tobacco Use: Medium Risk (04/29/2024)    Readmission Risk Interventions     No data to display

## 2024-05-01 NOTE — Plan of Care (Signed)

## 2024-05-01 NOTE — Telephone Encounter (Signed)
 Needs ov nonurgent within next 3 months for refills

## 2024-05-01 NOTE — Progress Notes (Signed)
 Heart Failure Navigator Progress Note Patient is established with Dr. Toribio Fuel at the CHF Clinic @ [facility]. Was admitted for Acute on Chronic Congestive Heart Failure on 04/29/24 @ [facility]. Called patient remotely to room 326. Two  Patient identifiers confirmed prior to telephone conversation. Education Assessment and Provision: Detailed education and instructions provided on heart failure disease management including the following:  Signs and symptoms of Heart Failure When to call the physician Importance of daily weights Low sodium diet Fluid restriction Medication management Anticipated future follow-up appointments  Patient education given on each of the above topics.  Patient acknowledges understanding via teach back method and acceptance of all instructions.  Education Materials:  Living Better With Heart Failure Booklet, HF zone tool, & Daily Weight Tracker Tool.  Patient has scale at home: No.  Reached out to social work @ [facility] to provide patient with a scale for daily weights prior to discharge.  Patient has pill box at home: No.  SW to also provide patient with one if they are available at [facility].   Patient had an appointment scheduled for 05/19/2024 @ [place] [facility]. Navigator will sign off at this time.  Charmaine Pines, RN, BSN [facility] Heart Failure Navigator Secure Chat Only

## 2024-05-01 NOTE — Progress Notes (Signed)
 Heart Failure Stewardship Pharmacy Note  PCP: Tobie Suzzane POUR, MD PCP-Cardiologist: Victory LELON Claudene DOUGLAS, MD (Inactive)  HPI: [name] is a 68 y.o. male with CHF, CKD, CAD, GERD, T2DM, hyperparathyroidism who presented with shortness of breath progressing over 3 days. On admission, proBNP was 5125, HS-troponin was 49. Chest x-ray noted likely pulmonary edema.   Pertinent cardiac history: Presented with large anterolateral STEMI in 01/2019 treated with DES to LAD. LVEF at that time was 20%. TTE repeated 07/2019 showed LVEF of 20-25%. Underwent [employer] ICD 08/2019. TTE 02/2021 showed LVEF improved to 30-35%. TTE 10/2021 with LVEF  20-25%. TTE this admission noted LVEF of 20-25% with moderate to severe MR, mild TR.  Pertinent Lab Values: Creat  Date Value Ref Range Status  05/28/2016 2.35 (H) 0.70 - 1.33 mg/dL Final    Comment:      For patients > or = 66 years of age: The upper reference limit for Creatinine is approximately 13% higher for people identified as African-American.      Creatinine, Ser  Date Value Ref Range Status  05/01/2024 2.90 (H) 0.61 - 1.24 mg/dL Final   BUN  Date Value Ref Range Status  05/01/2024 35 (H) 8 - 23 mg/dL Final  95/72/7977 23 8 - 27 mg/dL Final   Potassium  Date Value Ref Range Status  05/01/2024 3.5 3.5 - 5.1 mmol/L Final   Sodium  Date Value Ref Range Status  05/01/2024 140 135 - 145 mmol/L Final  08/28/2020 141 134 - 144 mmol/L Final   B Natriuretic Peptide  Date Value Ref Range Status  04/12/2024 1,015.9 (H) 0.0 - 100.0 pg/mL Final    Comment:    Performed at [facility], [address] [place], [place] [address]   Magnesium  Date Value Ref Range Status  05/01/2024 2.0 1.7 - 2.4 mg/dL Final    Comment:    Performed at [facility], [address], [place], [place] [address]   HB A1C [facility] - WAIVED)  Date Value Ref Range Status  04/09/2023 6.2 (H) 4.8 - 5.6 % Final    Comment:             Prediabetes:  5.7 - 6.4          Diabetes: >6.4          Glycemic control for adults with diabetes: <7.0    TSH  Date Value Ref Range Status  08/28/2020 0.993 0.450 - 4.500 uIU/mL Final    Vital Signs: Temp:  [97.6 F (36.4 C)-98.2 F (36.8 C)] 98 F (36.7 C) (12/29 0500) Pulse Rate:  [80-96] 80 (12/29 0500) Cardiac Rhythm: Normal sinus rhythm (12/29 0801) Resp:  [18] 18 (12/29 0500) BP: (109-128)/(87-98) 128/95 (12/29 0500) SpO2:  [93 %-100 %] 93 % (12/29 0500) Weight:  [81.3 kg (179 lb 3.7 oz)-81.4 kg (179 lb 8 oz)] 81.3 kg (179 lb 3.7 oz) (12/29 0500)  Intake/Output Summary (Last 24 hours) at 05/01/2024 0817 Last data filed at 05/01/2024 0300 Gross per 24 hour  Intake 963 ml  Output 650 ml  Net 313 ml    Current Heart Failure Medications:  Loop diuretic: furosemide  40 mg IV twice daily Beta-Blocker: carvedilol  12.5 mg BID ACEI/ARB/ARNI: none MRA: none SGLT2i: none Other: ivabradine  5 mg BID  Prior to admission Heart Failure Medications:  Loop diuretic: furosemide  40 mg daily Beta-Blocker: carvedilol  12.5 mg BID ACEI/ARB/ARNI: none MRA: none SGLT2i: none Other: ivabradine  5 mg BID  Assessment: 1. Acute on  chronic systolic heart failure (LVEF 20-25%)  , due to ICM. NYHA class III symptoms.  -Symptoms: Patient reports shortness of breath has improved significantly. He denies any LEE.  -Volume: Volume status is difficult to ascertain without physical examination. Currently on fursemide 40 mg IV BID. Per DOSE trial, recommended starting dose of diuretics would be 80 mg IV BID. I/O inaccurate. If suspected to still be hypervolemic, can consider increasing Lasix . -Hemodynamics: BP is elevated, especially diastolic pressure. -BB: Continue current dose of carvedilol . Would not increase at this time while diuresing. HR previously limited titration. -ACEI/ARB/ARNI: Unable to initiate at this time given poor renal function. -MRA: Unable to initiate at this time given poor renal  function. -SGLT2i: Unable to initiate at this time given poor renal function. -Can consider adding BiDil  given alternative GDMT is limited.  Plan: 1) Medication changes recommended at this time: -Consider adding BiDil  0.5 tablet TID.  2) Patient assistance: -Pending  3) Education: -- Patient has been educated on current HF medications and potential additions to HF medication regimen - Patient verbalizes understanding that over the next few months, these medication doses may change and more medications may be added to optimize HF regimen - Patient has been educated on basic disease state pathophysiology and goals of therapy  Please do not hesitate to reach out with questions or concerns,  Camillo Quadros, PharmD, CPP, BCPS, [facility] Heart Failure Pharmacist  Phone - [phone] 05/01/2024 1:11 PM

## 2024-05-09 NOTE — Progress Notes (Signed)
 [name]                                          MRN: [id number]   05/09/2024   The VBCI Quality Team Specialist reviewed this patient medical record for the purposes of chart review for care gap closure. The following were reviewed: chart review for care gap closure-glycemic status assessment.    VBCI Quality Team

## 2024-05-15 ENCOUNTER — Other Ambulatory Visit (HOSPITAL_COMMUNITY): Payer: Self-pay | Admitting: Internal Medicine

## 2024-05-16 ENCOUNTER — Other Ambulatory Visit (HOSPITAL_COMMUNITY): Payer: Self-pay | Admitting: Internal Medicine

## 2024-05-18 ENCOUNTER — Other Ambulatory Visit (HOSPITAL_COMMUNITY): Payer: Self-pay | Admitting: Internal Medicine

## 2024-05-18 ENCOUNTER — Telehealth: Payer: Self-pay

## 2024-05-18 NOTE — Telephone Encounter (Signed)
 Called to confirm/remind patient of their appointment at the [facility] on 05/19/24.   Appointment:   [x] Confirmed  [] Left mess   [] No answer/No voice mail  [] VM Full/unable to leave message  [] Phone not in service  Patient reminded to bring all medications and/or complete list.  Confirmed patient has transportation. Gave directions, instructed to utilize valet parking.

## 2024-05-19 ENCOUNTER — Ambulatory Visit: Admitting: Internal Medicine

## 2024-05-19 ENCOUNTER — Ambulatory Visit (HOSPITAL_COMMUNITY): Payer: Self-pay | Admitting: Family Medicine

## 2024-05-19 ENCOUNTER — Encounter (HOSPITAL_COMMUNITY): Payer: Self-pay

## 2024-05-19 ENCOUNTER — Ambulatory Visit (HOSPITAL_COMMUNITY)
Admission: RE | Admit: 2024-05-19 | Discharge: 2024-05-19 | Disposition: A | Discharge status: Home / Self Care | Source: Ambulatory Visit | Attending: Family Medicine | Admitting: Family Medicine

## 2024-05-19 VITALS — BP 112/66 | HR 72 | Wt 195.6 lb

## 2024-05-19 DIAGNOSIS — Z7902 Long term (current) use of antithrombotics/antiplatelets: Secondary | ICD-10-CM | POA: Diagnosis not present

## 2024-05-19 DIAGNOSIS — D631 Anemia in chronic kidney disease: Secondary | ICD-10-CM | POA: Insufficient documentation

## 2024-05-19 DIAGNOSIS — I2510 Atherosclerotic heart disease of native coronary artery without angina pectoris: Secondary | ICD-10-CM | POA: Diagnosis not present

## 2024-05-19 DIAGNOSIS — I130 Hypertensive heart and chronic kidney disease with heart failure and stage 1 through stage 4 chronic kidney disease, or unspecified chronic kidney disease: Secondary | ICD-10-CM | POA: Diagnosis not present

## 2024-05-19 DIAGNOSIS — Z79899 Other long term (current) drug therapy: Secondary | ICD-10-CM | POA: Insufficient documentation

## 2024-05-19 DIAGNOSIS — Z87891 Personal history of nicotine dependence: Secondary | ICD-10-CM | POA: Diagnosis not present

## 2024-05-19 DIAGNOSIS — I252 Old myocardial infarction: Secondary | ICD-10-CM | POA: Insufficient documentation

## 2024-05-19 DIAGNOSIS — I5022 Chronic systolic (congestive) heart failure: Secondary | ICD-10-CM | POA: Diagnosis present

## 2024-05-19 DIAGNOSIS — Z955 Presence of coronary angioplasty implant and graft: Secondary | ICD-10-CM | POA: Insufficient documentation

## 2024-05-19 DIAGNOSIS — E1122 Type 2 diabetes mellitus with diabetic chronic kidney disease: Secondary | ICD-10-CM | POA: Diagnosis not present

## 2024-05-19 DIAGNOSIS — I10 Essential (primary) hypertension: Secondary | ICD-10-CM | POA: Diagnosis not present

## 2024-05-19 DIAGNOSIS — Z7982 Long term (current) use of aspirin: Secondary | ICD-10-CM | POA: Insufficient documentation

## 2024-05-19 DIAGNOSIS — Z139 Encounter for screening, unspecified: Secondary | ICD-10-CM | POA: Diagnosis not present

## 2024-05-19 DIAGNOSIS — N184 Chronic kidney disease, stage 4 (severe): Secondary | ICD-10-CM | POA: Insufficient documentation

## 2024-05-19 LAB — BASIC METABOLIC PANEL WITH GFR
Anion gap: 12 (ref 5–15)
BUN: 48 mg/dL — ABNORMAL HIGH (ref 8–23)
CO2: 23 mmol/L (ref 22–32)
Calcium: 9.9 mg/dL (ref 8.9–10.3)
Chloride: 104 mmol/L (ref 98–111)
Creatinine, Ser: 3.46 mg/dL — ABNORMAL HIGH (ref 0.61–1.24)
GFR, Estimated: 19 mL/min — ABNORMAL LOW
Glucose, Bld: 82 mg/dL (ref 70–99)
Potassium: 4 mmol/L (ref 3.5–5.1)
Sodium: 139 mmol/L (ref 135–145)

## 2024-05-19 LAB — PRO BRAIN NATRIURETIC PEPTIDE: Pro Brain Natriuretic Peptide: 1979 pg/mL — ABNORMAL HIGH

## 2024-05-19 NOTE — Addendum Note (Signed)
 Encounter addended by: Cathern Andriette DEL, LCSW on: 05/19/2024 10:29 AM  Actions taken: Flowsheet accepted, Clinical Note Signed

## 2024-05-19 NOTE — Patient Instructions (Addendum)
 Good to see you today!   Labs done today, your results will be available in MyChart, we will contact you for abnormal readings.  Your physician recommends that you schedule a follow-up appointment as scheduled  If you have any questions or concerns before your next appointment please send us  a message through [facility] or call our office at [phone].    TO LEAVE A MESSAGE FOR THE NURSE SELECT OPTION 2, PLEASE LEAVE A MESSAGE INCLUDING: YOUR NAME DATE OF BIRTH CALL BACK NUMBER REASON FOR CALL**this is important as we prioritize the call backs  YOU WILL RECEIVE A CALL BACK THE SAME DAY AS LONG AS YOU CALL BEFORE 4:00 PM At the [facility], you and your health needs are our priority. As part of our continuing mission to provide you with exceptional heart care, we have created designated Provider Care Teams. These Care Teams include your primary Cardiologist (physician) and Advanced Practice Providers (APPs- Physician Assistants and Nurse Practitioners) who all work together to provide you with the care you need, when you need it.   You may see any of the following providers on your designated Care Team at your next follow up: Dr Toribio Fuel Dr Ezra Shuck Dr. Morene Brownie Greig Mosses, NP Caffie Shed, [place] [facility] [place], [place] Beckey Coe, NP Jordan Lee, NP Ellouise Class, NP Tinnie Redman, PharmD Jaun Bash, PharmD   Please be sure to bring in all your medications bottles to every appointment.    Thank you for choosing [facility]

## 2024-05-19 NOTE — Progress Notes (Signed)
 " Heart and Vascular Care Navigation  05/19/2024  [name] 06/13/1955 [id number]  Reason for Referral: housing concerns and food insecurity   Engaged with patient face to face for initial visit for Heart and Vascular Care Coordination.                                                                                                   Assessment:    Pt reports that 3 weeks ago he had to move out of housing due to inability to pay rent.  States it was income based housing but that rent increase of $200 was implemented and he could no longer afford to stay there with his $1,100/month rent.   Has since been staying in his car, with friends, or in a motel when he could afford.  States he will be in a motel tonight with a friend but then has no where to go.  States he is number 3-4 on the waitlist for affordable housing (rent under $200) but unclear what the timeline will be for this.   CSW provided with information regarding shelter in [place].  CSW spoke with shelter coordinator who does not normally accept [place] residents due to funding but will send me a referral form to justify accepting him- asked that patient reach out to her to coordinate- number provided to patient. Also states [place] location will be opening in February that might be an option for him as well.  Clinic staff provided pt with food bag and CSW provided with list of [employer] resources.                               HRT/VAS Care Coordination     Patients Home Cardiology Office [facility]   Living arrangements for the past 2 months No permanent address; Homeless   Lives with: Self   Patient Current Insurance Coverage Managed Medicare   Home Assistive Devices/Equipment Eyeglasses; Cane (specify quad or straight)   DME Agency NA   HH Agency [facility]       Social History:                                                                             SDOH Screenings   Food Insecurity:  Food Insecurity Present (05/19/2024)  Housing: High Risk (05/19/2024)  Transportation Needs: No Transportation Needs (05/19/2024)  Utilities: Not At Risk (04/29/2024)  Alcohol Screen: Low Risk (01/19/2022)  Depression (PHQ2-9): Low Risk (01/21/2024)  Financial Resource Strain: Low Risk (01/19/2022)  Physical Activity: Sufficiently Active (01/19/2022)  Social Connections: Moderately Integrated (04/29/2024)  Stress: No Stress Concern Present (01/19/2022)  Tobacco Use: Medium Risk (05/19/2024)    SDOH Interventions: Financial Resources:    Retirement income $1,100/month  Food  Insecurity:  Food Insecurity Interventions: [employer] Provided, Other (Comment) (heart and vascular food bag)  Housing Insecurity:  Housing Interventions: Community Resources Provided living in car/motel/friends house wherever he can  Transportation:   Transportation Interventions: Intervention Not Indicated   Follow-up plan:    Pt to call regarding shelter.  CSW will follow up with patient next week to check in.  Clifford HILARIO Leech, LCSW Clinical Social Worker [facility] Desk#: [phone] Cell#: [phone]      "

## 2024-05-19 NOTE — Progress Notes (Signed)
 "  ADVANCED HF CLINIC NOTE  PCP: Tobie Suzzane POUR, MD Primary Cardiologist: Dr. Wonda Nephrologist: Dr. Patience HF Cardiologist: Dr. Cherrie   HPI: [name] is a 69 y.o. male, former smoker with HTN, CKD IV, CAD s/p large anterolateral STEMI 9/20 and systolic HF. Has [employer] ICD.   Cath 03/12/2019 Total occlusion of the mid LAD treated with angioplasty and stenting using a 4.0 x 22 Onyx deployed at 14 atm.  0% stenosis was noted post procedure and TIMI grade III flow increased from 0 to 3.  The final angiographic images of the LAD demonstrates apical occlusion due to embolus. Luminal irregularities in the left main but no agreeable stenosis. Large circumflex with 3 obtuse marginals.  The first marginal is large and has a very distal 90% stenosis in 2 branches.  There is a trifurcation distally in the first marginal. RCA Moderate diffuse luminal irregularities but widely patent. Mid anterior wall to apical akinesis.  EF 30 to 35% acutely.  LVEDP 31 mmHg.  Post cath had low BP and shock but recovered. EF 20% by echo. No significant MR. Creatinine post cath 3.1-3.4. Now followed by nephrology.  Echo 3/21. EF 20-25%. RV normal.  S/p [employer] ICD implant 08/2019   Admitted to [facility] 10/22 with hgb 4.6 and SCr up to 4.2.Transfused 4u RBCs. Underwent EGD/colonoscopy 4mm x 8 mm prepyloric ulcer wuth prepyloric gastritis and duodenitis.   Admitted 11/25 with a/c HF, had not been compliant with meds at home & significant dietary indescretion. Diuresed and GDMT titrated. Echo showed EF 20-25%, G3DD, RV ok, moderate MR. Discharged home, weight 178 lbs.  Admitted 12/25 with a/c HF and acute resp failure. Diuresed with IV lasix . GDMT titrated and he was discharged home, weight 179 lbs.  Today he returns for post hospital HF follow up. Overall feeling great!. He is not SOB walking up steps. Urinating briskly. Denies palpitations, abnormal bleeding, CP, dizziness, edema, or  PND/Orthopnea. Appetite ok. Weight at home 195 pounds. Taking all medications. Says he was weighed in bed at hospital.    Cardiac Test Reviewed Echo 3/21: EF 20-25%. RV normal. Echo 10/22 EF 30-35%  Echo 6/23 EF 20-25% RV ok  Echo 11/24 EF 32%, RV ok Echo 2/25 EF 20-25% Grade 3DD RV ok Echo 11/25: EF 20-25%, G3DD, RV ok Ltd echo 12/25: EF 20-25%, mod to sev MR  Review of systems complete and found to be negative unless listed in HPI.    Past Medical History:  Diagnosis Date   Acute ST elevation myocardial infarction (STEMI) due to occlusion of mid portion of left anterior descending (LAD) coronary artery (HCC) 03/12/2019   Acute systolic heart failure (HCC)    AICD (automatic cardioverter/defibrillator) present    Arthritis    CHF (congestive heart failure) (HCC)    CKD (chronic kidney disease) stage 4, GFR 15-29 ml/min (HCC)    Diabetes mellitus, type II (HCC)    Dyspnea    with exertionm periodically   Heart attack (HCC) 03/15/2019   anterior MI    History of kidney stones    Hyperparathyroidism    Hypertension 2002   Ischemic cardiomyopathy    Left knee pain    Syncope 04/04/2019   after blood draw   Vitamin D deficiency    Current Outpatient Medications  Medication Sig Dispense Refill   allopurinol  (ZYLOPRIM ) 100 MG tablet Take 100 mg by mouth daily.      ALPRAZolam  (XANAX ) 0.5 MG tablet Take 1 tablet (0.5 mg  total) by mouth 3 (three) times daily as needed for anxiety. 90 tablet 2   atorvastatin  (LIPITOR ) 80 MG tablet Take 1 tablet (80 mg total) by mouth daily. 100 tablet 3   BRILINTA  60 MG TABS tablet TAKE (1) TABLET BY MOUTH TWICE DAILY. 180 tablet 3   carvedilol  (COREG ) 12.5 MG tablet TAKE 1 TABLET BY MOUTH TWICE DAILY WITH MEALS 180 tablet 3   cinacalcet  (SENSIPAR ) 30 MG tablet Take 30 mg by mouth daily.     furosemide  (LASIX ) 40 MG tablet Take 1 tablet (40 mg total) by mouth daily. 90 tablet 1   ivabradine  (CORLANOR ) 5 MG TABS tablet TAKE 1 TABLET BY MOUTH TWICE  DAILY WITH A MEAL 60 tablet 11   nitroGLYCERIN  (NITROSTAT ) 0.4 MG SL tablet Place 1 tablet (0.4 mg total) under the tongue every 5 (five) minutes as needed. 25 tablet 2   Oxycodone  HCl 10 MG TABS Take 1 tablet (10 mg total) by mouth 2 (two) times daily as needed (Severe pain). 60 tablet 0   pantoprazole  (PROTONIX ) 40 MG tablet Take 1 tablet (40 mg total) by mouth daily before breakfast. 90 tablet 1   No current facility-administered medications for this encounter.   No Known Allergies  Social History   Socioeconomic History   Marital status: Single    Spouse name: Not on file   Number of children: 0   Years of education: Not on file   Highest education level: Some college, no degree  Occupational History   Occupation: retired  Tobacco Use   Smoking status: Former    Current packs/day: 0.00    Average packs/day: 0.5 packs/day for 40.0 years (20.0 ttl pk-yrs)    Types: Cigarettes    Start date: 03/12/1979    Quit date: 03/12/2019    Years since quitting: 5.1   Smokeless tobacco: Never  Vaping Use   Vaping status: Never Used  Substance and Sexual Activity   Alcohol use: No   Drug use: No   Sexual activity: Not Currently  Other Topics Concern   Not on file  Social History Narrative   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       Enjoys: fishing, shopping, working around the house       Diet: working on changes, avoiding fried foods, increased veggies   Caffeine: teas some daily   Water : 6-8 cups daily       Wears seat belt    Does not use phone while driving    Smoke detectors at home    Social Drivers of Health   Tobacco Use: Medium Risk (05/19/2024)   Patient History    Smoking Tobacco Use: Former    Smokeless Tobacco Use: Never    Passive Exposure: Not on file  Financial Resource Strain: Low Risk (01/19/2022)   Overall Financial Resource Strain (CARDIA)    Difficulty of Paying Living Expenses: Not hard at all  Food Insecurity: No Food Insecurity (04/29/2024)    Epic    Worried About Radiation Protection Practitioner of Food in the Last Year: Never true    Ran Out of Food in the Last Year: Never true  Transportation Needs: No Transportation Needs (04/29/2024)   Epic    Lack of Transportation (Medical): No    Lack of Transportation (Non-Medical): No  Physical Activity: Sufficiently Active (01/19/2022)   Exercise Vital Sign    Days of Exercise per Week: 7 days    Minutes of Exercise per Session: 30 min  Stress: No  Stress Concern Present (01/19/2022)   [employer] of Occupational Health - Occupational Stress Questionnaire    Feeling of Stress : Not at all  Social Connections: Moderately Integrated (04/29/2024)   Social Connection and Isolation Panel    Frequency of Communication with Friends and Family: More than three times a week    Frequency of Social Gatherings with Friends and Family: Three times a week    Attends Religious Services: More than 4 times per year    Active Member of Clubs or Organizations: Yes    Attends Banker Meetings: More than 4 times per year    Marital Status: Never married  Intimate Partner Violence: Not At Risk (04/29/2024)   Epic    Fear of Current or Ex-Partner: No    Emotionally Abused: No    Physically Abused: No    Sexually Abused: No  Depression (PHQ2-9): Low Risk (01/21/2024)   Depression (PHQ2-9)    PHQ-2 Score: 0  Alcohol Screen: Low Risk (01/19/2022)   Alcohol Screen    Last Alcohol Screening Score (AUDIT): 0  Housing: Low Risk (04/29/2024)   Epic    Unable to Pay for Housing in the Last Year: No    Number of Times Moved in the Last Year: 0    Homeless in the Last Year: No  Utilities: Not At Risk (04/29/2024)   Epic    Threatened with loss of utilities: No  Health Literacy: Not on file   Family History  Problem Relation Age of Onset   Heart failure Mother    Heart disease Mother    Cancer Mother        breast   Heart attack Father    Hypertension Father    Heart disease Father    Diabetes  Brother    Wt Readings from Last 3 Encounters:  05/19/24 88.7 kg (195 lb 9.6 oz)  05/01/24 81.3 kg (179 lb 3.7 oz)  04/12/24 86.6 kg (191 lb)   BP 112/66   Pulse 72   Wt 88.7 kg (195 lb 9.6 oz)   SpO2 99%   BMI 26.53 kg/m   PHYSICAL EXAM: General:  NAD. No resp difficulty, walked into clinic HEENT: Normal Neck: Supple. No JVD. Cor: Regular rate & rhythm. No rubs, gallops or murmurs. Lungs: Clear Abdomen: Soft, nontender, nondistended.  Extremities: No cyanosis, clubbing, rash, edema Neuro: Alert & oriented x 3, moves all 4 extremities w/o difficulty. Affect pleasant.  ECG (personally reviewed): SB 59 bpm  ReDs reading: 36 %, abnormal  Device interrogation (personally reviewed): HL score 1, average HR 74 bpm, 1.7 hr/day activity, no VT  ASSESSMENT & PLAN: 1. Chronic Systolic HF - EF 79%. Following anteriorlateral stemi - Echo 3/21: EF 20-25%. RV ok.  - Echo 10/22: EF 30-35% in setting of severe anemia - Has [employer] ICD.  - Echo 11/24: EF 32%, RV ok - Echo 11/25: EF 25% - NYHA I-II. Volume status ok, ReDs 36%. Weight up but suspect d/c bed weight inaccurate - GDMT limited by CKD - Continue Lasix  40 mg daily.  - Continue Coreg  12.5 mg bid - Continue ivabradine  5 mg bid.  - Not a candidate for LVAD with CKD stage IV.  - Labs today.  2. CAD  - s/p acute anterolateral STEMI 03/12/19 - s/p DES LAD 11/20 - No chest pain.  - Continue ASA 81 + Brilinta  - Continue atorvastatin  80 mg daily.  3. CKD IV - Due to HTN nephropathy - Baseline SCr 2.8-3.4   -  Follows with Dr. Rachele.   4. HTN - BP well controlled - Avoid hypotension with CKD IV. - Labs today.  5. Tobacco use - Quit after MI.  - Congratulated on continued cessation.  6. SDOH - Has housing and food needs - Engage HFSW  Follow up in 3 months with Dr. Cherrie   Harlene CHRISTELLA Gainer, FNP  9:11 AM  "

## 2024-05-22 ENCOUNTER — Telehealth: Payer: Self-pay | Admitting: Internal Medicine

## 2024-05-22 ENCOUNTER — Other Ambulatory Visit: Payer: Self-pay

## 2024-05-22 ENCOUNTER — Other Ambulatory Visit: Payer: Self-pay | Admitting: Internal Medicine

## 2024-05-22 DIAGNOSIS — F411 Generalized anxiety disorder: Secondary | ICD-10-CM

## 2024-05-22 DIAGNOSIS — G894 Chronic pain syndrome: Secondary | ICD-10-CM

## 2024-05-22 DIAGNOSIS — M170 Bilateral primary osteoarthritis of knee: Secondary | ICD-10-CM

## 2024-05-22 MED ORDER — ALPRAZOLAM 0.5 MG PO TABS: 0.5000 mg | ORAL_TABLET | Freq: Three times a day (TID) | ORAL | 2 refills | Status: AC | PRN

## 2024-05-22 NOTE — Telephone Encounter (Signed)
 I refilled this along with his oxycodone 

## 2024-05-22 NOTE — Telephone Encounter (Signed)
 Prescription Request  05/22/2024  LOV: 01/21/2024  What is the name of the medication or equipment? ALPRAZolam  (XANAX ) 0.5 MG tablet [[id number]]   Have you contacted your pharmacy to request a refill? Yes   Which pharmacy would you like this sent to?  [facility] - [place], [place] - [address] [facility] [place] [address] Phone: [phone] Fax: [phone]    Patient notified that their request is being sent to the clinical staff for review and that they should receive a response within 2 business days.   Please advise at patient walked into office said this is due tomorrow

## 2024-05-24 ENCOUNTER — Telehealth (HOSPITAL_COMMUNITY): Payer: Self-pay | Admitting: Licensed Clinical Social Worker

## 2024-05-24 NOTE — Progress Notes (Unsigned)
 "   Cardiology Clinic Note   Patient Name: [name] Date of Encounter: 05/24/2024  Primary Care Provider:  Tobie Suzzane POUR, MD Primary Cardiologist:  Victory LELON Claudene DOUGLAS, MD (Inactive)  Patient Profile    Suheyb Raucci 69 year old male presents to the clinic today for follow-up evaluation of his ischemic cardiomyopathy and essential hypertension.  Past Medical History    Past Medical History:  Diagnosis Date   Acute ST elevation myocardial infarction (STEMI) due to occlusion of mid portion of left anterior descending (LAD) coronary artery (HCC) 03/12/2019   Acute systolic heart failure (HCC)    AICD (automatic cardioverter/defibrillator) present    Arthritis    CHF (congestive heart failure) (HCC)    CKD (chronic kidney disease) stage 4, GFR 15-29 ml/min (HCC)    Diabetes mellitus, type II (HCC)    Dyspnea    with exertionm periodically   Heart attack (HCC) 03/15/2019   anterior MI    History of kidney stones    Hyperparathyroidism    Hypertension 2002   Ischemic cardiomyopathy    Left knee pain    Syncope 04/04/2019   after blood draw   Vitamin D deficiency    Past Surgical History:  Procedure Laterality Date   AMPUTATION TOE Left 1991   2 digit of left foot   BIOPSY  02/26/2021   Procedure: BIOPSY;  Surgeon: Golda Claudis PENNER, MD;  Location: [facility];  Service: Endoscopy;;   CHOLECYSTECTOMY     COLONOSCOPY     COLONOSCOPY WITH PROPOFOL  N/A 02/26/2021   Procedure: COLONOSCOPY WITH PROPOFOL ;  Surgeon: Golda Claudis PENNER, MD;  Location: [facility];  Service: Endoscopy;  Laterality: N/A;   CORONARY/GRAFT ACUTE MI REVASCULARIZATION N/A 03/12/2019   Procedure: Coronary/Graft Acute MI Revascularization;  Surgeon: Claudene Victory LELON, MD;  Location: [facility];  Service: Cardiovascular;  Laterality: N/A;   ESOPHAGOGASTRODUODENOSCOPY (EGD) WITH PROPOFOL  N/A 02/26/2021   Procedure: ESOPHAGOGASTRODUODENOSCOPY (EGD) WITH PROPOFOL ;  Surgeon: Golda Claudis PENNER,  MD;  Location: [facility];  Service: Endoscopy;  Laterality: N/A;   ICD IMPLANT N/A 08/24/2019   Procedure: ICD IMPLANT;  Surgeon: Waddell Danelle LELON, MD;  Location: [facility] INVASIVE CV LAB;  Service: Cardiovascular;  Laterality: N/A;   IR URETERAL STENT LEFT NEW ACCESS W/O SEP NEPHROSTOMY CATH  09/26/2019   LEFT HEART CATH AND CORONARY ANGIOGRAPHY N/A 03/12/2019   Procedure: LEFT HEART CATH AND CORONARY ANGIOGRAPHY;  Surgeon: Claudene Victory LELON, MD;  Location: [facility];  Service: Cardiovascular;  Laterality: N/A;   LITHOTRIPSY Right    NEPHROLITHOTOMY Left 09/26/2019   Procedure: LEFT NEPHROLITHOTOMY PERCUTANEOUS;  Surgeon: Watt Rush, MD;  Location: [facility];  Service: Urology;  Laterality: Left;   TOTAL KNEE ARTHROPLASTY Left 02/02/2022   Procedure: TOTAL KNEE ARTHROPLASTY;  Surgeon: Rubie Kemps, MD;  Location: [facility];  Service: Orthopedics;  Laterality: Left;    Allergies  Allergies[1]  History of Present Illness    Lola Lofaro has a PMH of ischemic cardiomyopathy, systolic CHF with a EF 20-25%, CKD stage IV with a baseline creatinine of 2.8-3.8 (following with nephrology), coronary artery disease with PCI in 2020, HLD, HTN, and tobacco use.  His PMH also includes anterolateral STEMI 9/20 and he is status post [employer] ICD 4/21.  He has had several admissions for acute on chronic heart failure where he received diuresis and was continued on guideline directed medical therapy.   He was mated to the hospital on 04/29/2024 and discharged on  05/01/2024.  He was diagnosed with CHF exacerbation and hypoxia.  He was started on IV diuresis.  He diuresed well and was able to ambulate without dyspnea.  His leg swelling resolved.  His creatinine was stable.  A limited echocardiogram showed stable EF of 20-25%.  He followed up with the advanced heart failure clinic on 05/19/2024.  He remained stable from a cardiac standpoint.  His lab work remained stable.  His weight was noted to be 195  pounds.  He reported that he was feeling great.  He was not short of breath with walking upstairs.  He denied lower extremity edema orthopnea and PND.  Coronary artery disease-no chest pain today.  Denies exertional chest discomfort.  Status post STEMI 11/20.  He underwent PCI with DES to his LAD 11/20. Heart healthy low-sodium diet Maintain physical activity Continue aspirin , Brilinta , atorvastatin   Chronic systolic CHF-recent echocardiogram showed an LVEF of 20-25%.  He is status post [employer] ICD 4/21.  GDMT somewhat limited due to CKD. Continue furosemide , carvedilol , ivabradine  Following with advanced heart failure clinic  Hyperlipidemia-LDL***. High-fiber diet And physical activity Continue aspirin , atorvastatin   Essential hypertension-BP today***. Maintain blood pressure log Heart healthy low-sodium diet Continue carvedilol   CKD stage IV-creatinine 3.46 on 05/19/2024 Avoid nephrotoxic agents Follows with nephrology  Disposition: Follow-up with Dr. Floretta or me in 4 months.   Home Medications    Prior to Admission medications  Medication Sig Start Date End Date Taking? Authorizing Provider  allopurinol  (ZYLOPRIM ) 100 MG tablet Take 100 mg by mouth daily.  04/25/19 04/12/25  [provider]  ALPRAZolam  (XANAX ) 0.5 MG tablet Take 1 tablet (0.5 mg total) by mouth 3 (three) times daily as needed for anxiety. 05/22/24   Bevely Doffing, FNP  atorvastatin  (LIPITOR ) 80 MG tablet Take 1 tablet (80 mg total) by mouth daily. 09/08/23   Tobie Suzzane POUR, MD  BRILINTA  60 MG TABS tablet TAKE (1) TABLET BY MOUTH TWICE DAILY. 05/18/24   Bensimhon, Toribio SAUNDERS, MD  carvedilol  (COREG ) 12.5 MG tablet TAKE 1 TABLET BY MOUTH TWICE DAILY WITH MEALS 03/02/23   Bensimhon, Toribio SAUNDERS, MD  cinacalcet  (SENSIPAR ) 30 MG tablet Take 30 mg by mouth daily. 12/28/20   [provider]  furosemide  (LASIX ) 40 MG tablet Take 1 tablet (40 mg total) by mouth daily. 04/12/24   Glena Harlene HERO, FNP  ivabradine  (CORLANOR ) 5 MG TABS tablet TAKE 1 TABLET BY MOUTH TWICE DAILY WITH A MEAL 05/11/23   Waddell Danelle ORN, MD  nitroGLYCERIN  (NITROSTAT ) 0.4 MG SL tablet Place 1 tablet (0.4 mg total) under the tongue every 5 (five) minutes as needed. 07/23/23   [place], Harlene HERO, FNP  Oxycodone  HCl 10 MG TABS Take 1 tablet (10 mg total) by mouth 2 (two) times daily as needed (Severe pain). 05/22/24   Bevely Doffing, FNP  pantoprazole  (PROTONIX ) 40 MG tablet Take 1 tablet (40 mg total) by mouth daily before breakfast. 05/01/24   Ezzard Sonny RAMAN, PA-C    Family History    Family History  Problem Relation Age of Onset   Heart failure Mother    Heart disease Mother    Cancer Mother        breast   Heart attack Father    Hypertension Father    Heart disease Father    Diabetes Brother    He indicated that his mother is alive. He indicated that his father is deceased. He indicated that both of his brothers are alive.  Social History  Social History   Socioeconomic History   Marital status: Single    Spouse name: Not on file   Number of children: 0   Years of education: Not on file   Highest education level: Some college, no degree  Occupational History   Occupation: retired  Tobacco Use   Smoking status: Former    Current packs/day: 0.00    Average packs/day: 0.5 packs/day for 40.0 years (20.0 ttl pk-yrs)    Types: Cigarettes    Start date: 03/12/1979    Quit date: 03/12/2019    Years since quitting: 5.2   Smokeless tobacco: Never  Vaping Use   Vaping status: Never Used  Substance and Sexual Activity   Alcohol use: No   Drug use: No   Sexual activity: Not Currently  Other Topics Concern   Not on file  Social History Narrative   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       Enjoys: fishing, shopping, working around the house       Diet: working on changes, avoiding fried foods, increased veggies   Caffeine: teas some daily   Water : 6-8 cups daily       Wears seat belt     Does not use phone while driving    Smoke detectors at home    Social Drivers of Health   Tobacco Use: Medium Risk (05/19/2024)   Patient History    Smoking Tobacco Use: Former    Smokeless Tobacco Use: Never    Passive Exposure: Not on file  Financial Resource Strain: Low Risk (01/19/2022)   Overall Financial Resource Strain (CARDIA)    Difficulty of Paying Living Expenses: Not hard at all  Food Insecurity: Food Insecurity Present (05/19/2024)   Epic    Worried About Programme Researcher, Broadcasting/film/video in the Last Year: Sometimes true    Ran Out of Food in the Last Year: Sometimes true  Transportation Needs: No Transportation Needs (05/19/2024)   Epic    Lack of Transportation (Medical): No    Lack of Transportation (Non-Medical): No  Physical Activity: Sufficiently Active (01/19/2022)   Exercise Vital Sign    Days of Exercise per Week: 7 days    Minutes of Exercise per Session: 30 min  Stress: No Stress Concern Present (01/19/2022)   [employer] of Occupational Health - Occupational Stress Questionnaire    Feeling of Stress : Not at all  Social Connections: Moderately Integrated (04/29/2024)   Social Connection and Isolation Panel    Frequency of Communication with Friends and Family: More than three times a week    Frequency of Social Gatherings with Friends and Family: Three times a week    Attends Religious Services: More than 4 times per year    Active Member of Clubs or Organizations: Yes    Attends Banker Meetings: More than 4 times per year    Marital Status: Never married  Intimate Partner Violence: Not At Risk (04/29/2024)   Epic    Fear of Current or Ex-Partner: No    Emotionally Abused: No    Physically Abused: No    Sexually Abused: No  Depression (PHQ2-9): Low Risk (01/21/2024)   Depression (PHQ2-9)    PHQ-2 Score: 0  Alcohol Screen: Low Risk (01/19/2022)   Alcohol Screen    Last Alcohol Screening Score (AUDIT): 0  Housing: High Risk (05/19/2024)   Epic     Unable to Pay for Housing in the Last Year: Yes    Number of Times  Moved in the Last Year: 1    Homeless in the Last Year: Yes  Utilities: Not At Risk (04/29/2024)   Epic    Threatened with loss of utilities: No  Health Literacy: Not on file     Review of Systems    General:  No chills, fever, night sweats or weight changes.  Cardiovascular:  No chest pain, dyspnea on exertion, edema, orthopnea, palpitations, paroxysmal nocturnal dyspnea. Dermatological: No rash, lesions/masses Respiratory: No cough, dyspnea Urologic: No hematuria, dysuria Abdominal:   No nausea, vomiting, diarrhea, bright red blood per rectum, melena, or hematemesis Neurologic:  No visual changes, wkns, changes in mental status. All other systems reviewed and are otherwise negative except as noted above.  Physical Exam    VS:  There were no vitals taken for this visit. , BMI There is no height or weight on file to calculate BMI. GEN: Well nourished, well developed, in no acute distress. HEENT: normal. Neck: Supple, no JVD, carotid bruits, or masses. Cardiac: RRR, no murmurs, rubs, or gallops. No clubbing, cyanosis, edema.  Radials/DP/PT 2+ and equal bilaterally.  Respiratory:  Respirations regular and unlabored, clear to auscultation bilaterally. GI: Soft, nontender, nondistended, BS + x 4. MS: no deformity or atrophy. Skin: warm and dry, no rash. Neuro:  Strength and sensation are intact. Psych: Normal affect.  Accessory Clinical Findings    Recent Labs: 03/07/2024: ALT 7 04/12/2024: B Natriuretic Peptide 1,015.9 05/01/2024: Hemoglobin 11.3; Magnesium 2.0; Platelets 176 05/19/2024: BUN 48; Creatinine, Ser 3.46; Potassium 4.0; Pro Brain Natriuretic Peptide 1,979.0; Sodium 139   Recent Lipid Panel    Component Value Date/Time   CHOL 156 03/10/2024 0449   CHOL 126 08/28/2020 1009   TRIG 156 (H) 03/10/2024 0449   HDL 38 (L) 03/10/2024 0449   HDL 52 08/28/2020 1009   CHOLHDL 4.2 03/10/2024 0449    VLDL 31 03/10/2024 0449   LDLCALC 87 03/10/2024 0449   LDLCALC 53 08/28/2020 1009    No BP recorded.  {Refresh Note OR Click here to enter BP  :1}***    ECG personally reviewed by me today- ***    Echocardiogram 04/29/2024  IMPRESSIONS     1. Left ventricular ejection fraction, by estimation, is 20 to 25%. The  left ventricle has severely decreased function. The left ventricular  internal cavity size was moderately to severely dilated.   2. Moderate to severe mitral valve regurgitation.   3. Tricuspid valve regurgitation is mild.   4. The aortic valve is tricuspid. Aortic valve regurgitation is not  visualized. No aortic stenosis is present.   5. There is normal pulmonary artery systolic pressure.   6. The inferior vena cava is dilated in size with <50% respiratory  variability, suggesting right atrial pressure of 15 mmHg.   Comparison(s): Prior images reviewed side by side. Increase in mitral  regurgitation in the setting of increased IVC plethora.   FINDINGS   Left Ventricle: Left ventricular ejection fraction, by estimation, is 20  to 25%. The left ventricle has severely decreased function. Definity   contrast agent was given IV to delineate the left ventricular endocardial  borders. The left ventricular  internal cavity size was moderately to severely dilated.     LV Wall Scoring:  The mid and distal inferior wall, basal anteroseptal segment, basal  anterior  segment, and basal inferoseptal segment are akinetic. The mid and distal  anterior wall, antero-lateral wall, mid and distal lateral wall, mid and  distal anterior septum, mid inferoseptal segment, and basal  inferior  segment  are hypokinetic. The basal inferolateral segment is normal.   Right Ventricle: There is normal pulmonary artery systolic pressure. The  tricuspid regurgitant velocity is 2.19 m/s, and with an assumed right  atrial pressure of 15 mmHg, the estimated right ventricular systolic  pressure is  34.2 mmHg.   Mitral Valve: Moderate to severe mitral valve regurgitation.   Tricuspid Valve: Eccentricl tricuspid regurgitation not well imaged in  this study. The tricuspid valve is normal in structure. Tricuspid valve  regurgitation is mild . No evidence of tricuspid stenosis.   Aortic Valve: The aortic valve is tricuspid. Aortic valve regurgitation is  not visualized. No aortic stenosis is present.   Pulmonic Valve: The pulmonic valve was normal in structure. Pulmonic valve  regurgitation is not visualized. No evidence of pulmonic stenosis.   Venous: The inferior vena cava is dilated in size with less than 50%  respiratory variability, suggesting right atrial pressure of 15 mmHg.      Assessment & Plan   1.  ***   Josefa HERO. Sheriden Archibeque NP-C     05/24/2024, 7:56 AM [facility] [address] [place], [place] [address] Office [phone]    Notice: This dictation was prepared with Dragon dictation along with smaller phrase technology. Any transcriptional errors that result from this process are unintentional and may not be corrected upon review.   I spent***minutes examining this patient, reviewing medications, and using patient centered shared decision making involving their cardiac care.   I spent  20 minutes reviewing past medical history,  medications, and prior cardiac tests.     [1] No Known Allergies  "

## 2024-05-24 NOTE — Telephone Encounter (Signed)
 H&V Care Navigation CSW Progress Note  Clinical Social Worker called pt to check in.  He confirms he was able to get into shelter in [place] where CSW had connected him to intake coordinator.  Reports no other needs at this time- has our number if he needs further assistance.  [name], LCSW Clinical Social Worker Advanced [facility] Failure Clinic Desk#: [phone] Cell#: [phone]

## 2024-05-26 ENCOUNTER — Ambulatory Visit: Admitting: General Practice

## 2024-05-26 NOTE — Progress Notes (Signed)
 [name]                                          MRN: [id number]   05/26/2024   The [facility] Quality Team Specialist reviewed this patient medical record for the purposes of chart review for care gap closure. The following were reviewed: chart review for care gap closure-glycemic status assessment.    [facility]BCI Quality Team

## 2024-06-02 ENCOUNTER — Other Ambulatory Visit: Payer: Self-pay | Admitting: Internal Medicine

## 2024-06-06 NOTE — Telephone Encounter (Signed)
 This is a CHF pt

## 2024-06-09 ENCOUNTER — Ambulatory Visit: Payer: Self-pay

## 2024-06-09 ENCOUNTER — Encounter: Payer: Self-pay | Admitting: Emergency Medicine

## 2024-06-09 VITALS — BP 108/70 | HR 79 | Resp 12 | Ht 72.0 in | Wt 189.1 lb

## 2024-06-09 DIAGNOSIS — F17210 Nicotine dependence, cigarettes, uncomplicated: Secondary | ICD-10-CM

## 2024-06-09 DIAGNOSIS — E1121 Type 2 diabetes mellitus with diabetic nephropathy: Secondary | ICD-10-CM

## 2024-06-09 DIAGNOSIS — Z0000 Encounter for general adult medical examination without abnormal findings: Secondary | ICD-10-CM

## 2024-06-09 DIAGNOSIS — Z2821 Immunization not carried out because of patient refusal: Secondary | ICD-10-CM

## 2024-06-09 MED ORDER — IVABRADINE HCL 5 MG PO TABS: 5.0000 mg | ORAL_TABLET | Freq: Two times a day (BID) | ORAL | 11 refills | Status: AC

## 2024-06-09 NOTE — Patient Instructions (Signed)
 [name],  Thank you for taking the time for your Medicare Wellness Visit. I appreciate your continued commitment to your health goals. Please review the care plan we discussed, and feel free to reach out if I can assist you further.  Please note that Annual Wellness Visits do not include a physical exam. Some assessments may be limited, especially if the visit was conducted virtually. If needed, we may recommend an in-person follow-up with your provider.  Ongoing Care Seeing your primary care provider every 3 to 6 months helps us  monitor your health and provide consistent, personalized care.   Referrals If a referral was made during today's visit and you haven't received any updates within two weeks, please contact the referred provider directly to check on the status.  Lung Cancer Screening-[place] Office [address]-First Floor Medical Building directly across from [facility] Phone Number:[phone]   Recommended Screenings:  Health Maintenance  Topic Date Due   Kidney health urinalysis for diabetes  Never done   DTaP/Tdap/Td vaccine (1 - Tdap) Never done   Pneumococcal Vaccine for age over 17 (1 of 2 - PCV) Never done   Screening for Lung Cancer  Never done   Medicare Annual Wellness Visit  01/20/2023   COVID-19 Vaccine (4 - 2025-26 season) 01/03/2024   Eye exam for diabetics  05/23/2024   Flu Shot  08/01/2024*   Complete foot exam   07/26/2024   Yearly kidney function blood test for diabetes  05/19/2025   Colon Cancer Screening  02/27/2031   Hepatitis C Screening  Completed   Zoster (Shingles) Vaccine  Completed   Meningitis B Vaccine  Aged Out   Hemoglobin A1C  Discontinued  *Topic was postponed. The date shown is not the original due date.       04/29/2024   12:42 PM  Advanced Directives  Does Patient Have a Medical Advance Directive? No  Would patient like information on creating a medical advance directive? No - Patient declined    Vision: Annual  vision screenings are recommended for early detection of glaucoma, cataracts, and diabetic retinopathy. These exams can also reveal signs of chronic conditions such as diabetes and high blood pressure.  Dental: Annual dental screenings help detect early signs of oral cancer, gum disease, and other conditions linked to overall health, including heart disease and diabetes.  Please see the attached documents for additional preventive care recommendations.

## 2024-06-09 NOTE — Progress Notes (Signed)
 "  Vaccines not given: Flu declined today  Chief Complaint  Patient presents with   Medicare Wellness     Subjective:   [name] is a 69 y.o. male who presents for a Medicare Annual Wellness Visit.  Visit info / Clinical Intake: Medicare Wellness Visit Type:: Subsequent Annual Wellness Visit Persons participating in visit and providing information:: patient Medicare Wellness Visit Mode:: In-person (required for WTM) Interpreter Needed?: No Pre-visit prep was completed: yes AWV questionnaire completed by patient prior to visit?: no Living arrangements:: (!) lives alone Patient's Overall Health Status Rating: very good Typical amount of pain: some (RT knee pain, needs a knee replacement) Does pain affect daily life?: (!) yes Are you currently prescribed opioids?: (!) yes  Dietary Habits and Nutritional Risks How many meals a day?: 2 Eats fruit and vegetables daily?: yes Most meals are obtained by: preparing own meals In the last 2 weeks, have you had any of the following?: none Diabetic:: (!) yes Any non-healing wounds?: no How often do you check your BS?: 1 Would you like to be referred to a Nutritionist or for Diabetic Management? : no  Functional Status Activities of Daily Living (to include ambulation/medication): Independent Ambulation: Independent Medication Administration: Independent Home Management (perform basic housework or laundry): Independent Manage your own finances?: yes Primary transportation is: driving Concerns about vision?: no *vision screening is required for WTM* Concerns about hearing?: no  Fall Screening Falls in the past year?: 0 Number of falls in past year: 0 Was there an injury with Fall?: 0 Fall Risk Category Calculator: 0 Patient Fall Risk Level: Low Fall Risk  Fall Risk Patient at Risk for Falls Due to: Orthopedic patient Fall risk Follow up: Falls evaluation completed; Education provided; Falls prevention discussed  Home  and Transportation Safety: All rugs have non-skid backing?: N/A, no rugs All stairs or steps have railings?: N/A, no stairs Grab bars in the bathtub or shower?: yes Have non-skid surface in bathtub or shower?: yes Good home lighting?: yes Hospital stays in the last year:: (!) yes How many hospital stays:: 2 Reason: congestive heart failure  Cognitive Assessment Difficulty concentrating, remembering, or making decisions? : no Will 6CIT or Mini Cog be Completed: yes What year is it?: 0 points What month is it?: 0 points Give patient an address phrase to remember (5 components): [address] [facility] [place] About what time is it?: 0 points Count backwards from 20 to 1: 0 points Say the months of the year in reverse: 0 points Repeat the address phrase from earlier: 0 points 6 CIT Score: 0 points  Advance Directives (For Healthcare) Does Patient Have a Medical Advance Directive?: No Would patient like information on creating a medical advance directive?: No - Patient declined  Reviewed/Updated  Reviewed/Updated: Reviewed All (Medical, Surgical, Family, Medications, Allergies, Care Teams, Patient Goals)    Allergies (verified) Patient has no known allergies.   Current Medications (verified) Outpatient Encounter Medications as of 06/09/2024  Medication Sig   allopurinol  (ZYLOPRIM ) 100 MG tablet Take 100 mg by mouth daily.    ALPRAZolam  (XANAX ) 0.5 MG tablet Take 1 tablet (0.5 mg total) by mouth 3 (three) times daily as needed for anxiety.   atorvastatin  (LIPITOR ) 80 MG tablet Take 1 tablet (80 mg total) by mouth daily.   BRILINTA  60 MG TABS tablet TAKE (1) TABLET BY MOUTH TWICE DAILY.   carvedilol  (COREG ) 12.5 MG tablet TAKE 1 TABLET BY MOUTH TWICE DAILY WITH MEALS   cinacalcet  (SENSIPAR ) 30 MG tablet  Take 30 mg by mouth daily.   furosemide  (LASIX ) 40 MG tablet Take 1 tablet (40 mg total) by mouth daily.   ivabradine  (CORLANOR ) 5 MG TABS tablet TAKE 1 TABLET BY MOUTH TWICE DAILY WITH A  MEAL   nitroGLYCERIN  (NITROSTAT ) 0.4 MG SL tablet Place 1 tablet (0.4 mg total) under the tongue every 5 (five) minutes as needed.   Oxycodone  HCl 10 MG TABS Take 1 tablet (10 mg total) by mouth 2 (two) times daily as needed (Severe pain).   pantoprazole  (PROTONIX ) 40 MG tablet Take 1 tablet (40 mg total) by mouth daily before breakfast.   No facility-administered encounter medications on file as of 06/09/2024.    History: Past Medical History:  Diagnosis Date   Acute ST elevation myocardial infarction (STEMI) due to occlusion of mid portion of left anterior descending (LAD) coronary artery (HCC) 03/12/2019   Acute systolic heart failure (HCC)    AICD (automatic cardioverter/defibrillator) present    Arthritis    CHF (congestive heart failure) (HCC)    CKD (chronic kidney disease) stage 4, GFR 15-29 ml/min (HCC)    Diabetes mellitus, type II (HCC)    Dyspnea    with exertionm periodically   Heart attack (HCC) 03/15/2019   anterior MI    History of kidney stones    Hyperparathyroidism    Hypertension 2002   Ischemic cardiomyopathy    Left knee pain    Syncope 04/04/2019   after blood draw   Vitamin D deficiency    Past Surgical History:  Procedure Laterality Date   AMPUTATION TOE Left 1991   2 digit of left foot   BIOPSY  02/26/2021   Procedure: BIOPSY;  Surgeon: Golda Claudis PENNER, MD;  Location: [facility];  Service: Endoscopy;;   CHOLECYSTECTOMY     COLONOSCOPY     COLONOSCOPY WITH PROPOFOL  N/A 02/26/2021   Procedure: COLONOSCOPY WITH PROPOFOL ;  Surgeon: Golda Claudis PENNER, MD;  Location: [facility];  Service: Endoscopy;  Laterality: N/A;   CORONARY/GRAFT ACUTE MI REVASCULARIZATION N/A 03/12/2019   Procedure: Coronary/Graft Acute MI Revascularization;  Surgeon: Claudene Victory ORN, MD;  Location: [facility];  Service: Cardiovascular;  Laterality: N/A;   ESOPHAGOGASTRODUODENOSCOPY (EGD) WITH PROPOFOL  N/A 02/26/2021   Procedure: ESOPHAGOGASTRODUODENOSCOPY (EGD) WITH  PROPOFOL ;  Surgeon: Golda Claudis PENNER, MD;  Location: [facility];  Service: Endoscopy;  Laterality: N/A;   ICD IMPLANT N/A 08/24/2019   Procedure: ICD IMPLANT;  Surgeon: Waddell Danelle ORN, MD;  Location: [facility] CV LAB;  Service: Cardiovascular;  Laterality: N/A;   IR URETERAL STENT LEFT NEW ACCESS W/O SEP NEPHROSTOMY CATH  09/26/2019   LEFT HEART CATH AND CORONARY ANGIOGRAPHY N/A 03/12/2019   Procedure: LEFT HEART CATH AND CORONARY ANGIOGRAPHY;  Surgeon: Claudene Victory ORN, MD;  Location: [facility];  Service: Cardiovascular;  Laterality: N/A;   LITHOTRIPSY Right    NEPHROLITHOTOMY Left 09/26/2019   Procedure: LEFT NEPHROLITHOTOMY PERCUTANEOUS;  Surgeon: Watt Rush, MD;  Location: [facility];  Service: Urology;  Laterality: Left;   TOTAL KNEE ARTHROPLASTY Left 02/02/2022   Procedure: TOTAL KNEE ARTHROPLASTY;  Surgeon: Rubie Kemps, MD;  Location: [facility];  Service: Orthopedics;  Laterality: Left;   Family History  Problem Relation Age of Onset   Heart failure Mother    Heart disease Mother    Cancer Mother        breast   Heart attack Father    Hypertension Father    Heart disease Father  Kidney cancer Brother 52       cause of death   Diabetes Brother    Social History   Occupational History   Occupation: retired  Tobacco Use   Smoking status: Former    Current packs/day: 0.00    Average packs/day: 0.5 packs/day for 40.0 years (20.0 ttl pk-yrs)    Types: Cigarettes    Start date: 03/12/1979    Quit date: 03/12/2019    Years since quitting: 5.2   Smokeless tobacco: Never  Vaping Use   Vaping status: Never Used  Substance and Sexual Activity   Alcohol use: No   Drug use: No   Sexual activity: Not Currently   Tobacco Counseling Counseling given: Yes  SDOH Screenings   Food Insecurity: No Food Insecurity (06/09/2024)  Recent Concern: Food Insecurity - Food Insecurity Present (05/19/2024)  Housing: Low Risk (06/09/2024)  Recent Concern: Housing - High Risk (05/19/2024)   Transportation Needs: No Transportation Needs (06/09/2024)  Utilities: Not At Risk (04/29/2024)  Alcohol Screen: Low Risk (01/19/2022)  Depression (PHQ2-9): Low Risk (06/09/2024)  Financial Resource Strain: Low Risk (01/19/2022)  Physical Activity: Sufficiently Active (06/09/2024)  Social Connections: Moderately Integrated (06/09/2024)  Stress: No Stress Concern Present (06/09/2024)  Tobacco Use: Medium Risk (06/09/2024)  Health Literacy: Adequate Health Literacy (06/09/2024)   See flowsheets for full screening details  Depression Screen PHQ 2 & 9 Depression Scale- Over the past 2 weeks, how often have you been bothered by any of the following problems? Little interest or pleasure in doing things: 0 Feeling down, depressed, or hopeless (PHQ Adolescent also includes...irritable): 0 PHQ-2 Total Score: 0 Trouble falling or staying asleep, or sleeping too much: 0 Feeling tired or having little energy: 0 Poor appetite or overeating (PHQ Adolescent also includes...weight loss): 0 Feeling bad about yourself - or that you are a failure or have let yourself or your family down: 0 Trouble concentrating on things, such as reading the newspaper or watching television (PHQ Adolescent also includes...like school work): 0 Moving or speaking so slowly that other people could have noticed. Or the opposite - being so fidgety or restless that you have been moving around a lot more than usual: 0 Thoughts that you would be better off dead, or of hurting yourself in some way: 0 PHQ-9 Total Score: 0 If you checked off any problems, how difficult have these problems made it for you to do your work, take care of things at home, or get along with other people?: Somewhat difficult     Goals Addressed               This Visit's Progress     I want to maintain my current weight (pt-stated)               Objective:    Today's Vitals   06/09/24 0900 06/09/24 0901  BP: 133/85 108/70  Pulse: 79   Resp: 12    SpO2: 98%   Weight: 189 lb 1.3 oz (85.8 kg)   Height: 6' (1.829 m)    Body mass index is 25.64 kg/m.  Hearing/Vision screen Hearing Screening - Comments:: Patient denies any hearing difficulties.   Vision Screening - Comments:: Patient sees Dr. Robinson in [place] and has an appt scheduled for yearly screening Immunizations and Health Maintenance Health Maintenance  Topic Date Due   Diabetic kidney evaluation - Urine ACR  Never done   DTaP/Tdap/Td (1 - Tdap) Never done   Pneumococcal Vaccine: 50+ Years (1 of 2 -  PCV) Never done   Lung Cancer Screening  Never done   COVID-19 Vaccine (4 - 2025-26 season) 01/03/2024   OPHTHALMOLOGY EXAM  05/23/2024   Influenza Vaccine  08/01/2024 (Originally 12/03/2023)   FOOT EXAM  07/26/2024   Diabetic kidney evaluation - eGFR measurement  05/19/2025   Medicare Annual Wellness (AWV)  06/09/2025   Colonoscopy  02/27/2031   Hepatitis C Screening  Completed   Zoster Vaccines- Shingrix  Completed   Meningococcal B Vaccine  Aged Out   HEMOGLOBIN A1C  Discontinued        Assessment/Plan:  This is a routine wellness examination for [facility].  Patient Care Team: Tobie Suzzane POUR, MD as PCP - General (Internal Medicine) Waddell Danelle ORN, MD as Consulting Physician (Cardiology) Bensimhon, Toribio SAUNDERS, MD as Consulting Physician (Cardiology) Robinson Mayo, OD as Referring Physician (Optometry) Ezzard Sonny GORMAN DEVONNA as Physician Assistant (Gastroenterology) Loreda Hacker, DPM as Consulting Physician (Podiatry) Cindie Carlin POUR, DO as Consulting Physician (Gastroenterology) Melodi Lerner, MD as Consulting Physician (Orthopedic Surgery)  I have personally reviewed and noted the following in the patients chart:   Medical and social history Use of alcohol, tobacco or illicit drugs  Current medications and supplements including opioid prescriptions. Functional ability and status Nutritional status Physical activity Advanced directives List of other  physicians Hospitalizations, surgeries, and ER visits in previous 12 months Vitals Screenings to include cognitive, depression, and falls Referrals and appointments  Orders Placed This Encounter  Procedures   Microalbumin / creatinine urine ratio   Ambulatory Referral for Lung Cancer Scre    Referral Priority:   Routine    Referral Type:   Consultation    Referral Reason:   Specialty Services Required    Number of Visits Requested:   1   In addition, I have reviewed and discussed with patient certain preventive protocols, quality metrics, and best practice recommendations. A written personalized care plan for preventive services as well as general preventive health recommendations were provided to patient.   Ralene Gasparyan, CMA   06/09/2024   Return in 53 weeks (on 06/15/2025), or June 15, 2025 at 8:40 am, for In office Medicare Well Visit w  Wellness Nurse.  After Visit Summary: (In Person-Printed) AVS printed and given to the patient   "

## 2024-06-19 ENCOUNTER — Ambulatory Visit

## 2024-06-23 ENCOUNTER — Ambulatory Visit: Payer: Self-pay | Admitting: Internal Medicine

## 2024-08-09 ENCOUNTER — Ambulatory Visit: Admitting: Podiatry

## 2024-08-17 ENCOUNTER — Ambulatory Visit (HOSPITAL_COMMUNITY): Admitting: Internal Medicine

## 2024-09-18 ENCOUNTER — Ambulatory Visit

## 2024-12-18 ENCOUNTER — Ambulatory Visit

## 2025-03-19 ENCOUNTER — Ambulatory Visit

## 2025-06-15 ENCOUNTER — Ambulatory Visit: Payer: Self-pay

## 2025-06-18 ENCOUNTER — Ambulatory Visit
